# Patient Record
Sex: Male | Born: 1950 | Race: Black or African American | Hispanic: No | Marital: Single | Smoking: Former smoker
Health system: Southern US, Community
[De-identification: ages and names within clinical notes are randomized; demographics above are authoritative.]

## PROBLEM LIST (undated history)

## (undated) DIAGNOSIS — G629 Polyneuropathy, unspecified: Secondary | ICD-10-CM

## (undated) DIAGNOSIS — I10 Essential (primary) hypertension: Secondary | ICD-10-CM

## (undated) DIAGNOSIS — I5022 Chronic systolic (congestive) heart failure: Secondary | ICD-10-CM

## (undated) DIAGNOSIS — H269 Unspecified cataract: Secondary | ICD-10-CM

## (undated) DIAGNOSIS — R740 Nonspecific elevation of levels of transaminase and lactic acid dehydrogenase [LDH]: Secondary | ICD-10-CM

## (undated) DIAGNOSIS — E785 Hyperlipidemia, unspecified: Secondary | ICD-10-CM

## (undated) DIAGNOSIS — Z9989 Dependence on other enabling machines and devices: Secondary | ICD-10-CM

## (undated) DIAGNOSIS — J449 Chronic obstructive pulmonary disease, unspecified: Secondary | ICD-10-CM

## (undated) DIAGNOSIS — I2510 Atherosclerotic heart disease of native coronary artery without angina pectoris: Secondary | ICD-10-CM

## (undated) DIAGNOSIS — E119 Type 2 diabetes mellitus without complications: Secondary | ICD-10-CM

## (undated) DIAGNOSIS — I255 Ischemic cardiomyopathy: Secondary | ICD-10-CM

## (undated) DIAGNOSIS — K219 Gastro-esophageal reflux disease without esophagitis: Secondary | ICD-10-CM

## (undated) DIAGNOSIS — I739 Peripheral vascular disease, unspecified: Secondary | ICD-10-CM

## (undated) DIAGNOSIS — C801 Malignant (primary) neoplasm, unspecified: Secondary | ICD-10-CM

## (undated) DIAGNOSIS — Z95810 Presence of automatic (implantable) cardiac defibrillator: Secondary | ICD-10-CM

## (undated) DIAGNOSIS — R7401 Elevation of levels of liver transaminase levels: Secondary | ICD-10-CM

## (undated) DIAGNOSIS — Z720 Tobacco use: Secondary | ICD-10-CM

## (undated) HISTORY — PX: APPENDECTOMY: SHX54

## (undated) HISTORY — PX: COLONOSCOPY: SHX174

## (undated) NOTE — *Deleted (*Deleted)
***In Progress*** PCP: [name] [facility]) Cardiac Surgeon: Dr Roxan Hockey Pulmonologist: Dr. Lamonte Sakai Cardiology: Dr. Aundra Dubin  HPI:  [name] is a82 y.o.with history of COPD, HTN, 3V CAD s/p CABG x 5 (CABG 04/2013: LIMA to LAD, SVG to second diagonal, SVG to ramus intermediate and obtuse marginal 2, SVG to posterior descending), ischemic cardiomyopathy, chronic systolic HF, COPD, DM2, PVD and tobacco abuse. St Jude ICD.   Lexiscan Cardiolite in 12/19 showed inferior infarct, no ischemia. Echo in 12/19 showed EF 35%, mild LVH, mildly decreased RV systolic function. ABIs in 12/19 were stable compared to the past.   In the fall of 2020, he was diagnosed with non-small cell lung cancer. He has been treated with radiation and carboplatin/paclitaxel. He lost over 20 lbs. He was admitted 1/21 at [facility] with ICD shocks. These were found to be inappropriate shocks probably due to atrial fibrillation. He is now on amiodarone and Eliquis.   TEE in 3/21 was done to assess mitral regurgitation, showing EF 25-30%, mildly decreased RV systolic function, 3+ likely ischemic MR. In 5/21, he had symptomatic anemia. EGD/colonoscopy done, no definite cause. ABIs in 5/21 were stable.   He was recently seen by Dr. Aundra Dubin on 01/24/2020 and was volume overloaded. Weight was up 6 lbs but his BPwas low in the 123XX123 systolic. It appeared he had been taking both carvedilol and metoprolol succinate. Given his COPD, he was advised to stop carvedilol and to continue metoprolol succinate. Hydralazine was also discontinued. Furosemide was increased to 40 mg daily. Plan was to refer to paramedicine, but he did not qualify as he lives in [place] He has a home health aid that assists him with meds.   He recently returned to HF Clinic for follow up on 03/05/20 with Lyda Jester, PA-C. His weight was down 13 lbs from 204>>191 lbs. Hypotension resolved, BP was 112/64. PCP recently stopped  most of his HF meds due to worsening renal function and hyperkalemia. SW assisted with visit and confirmed that he was taken off furosemide, Farxiga, spironolactone, amlodipine, metoprolol and Entresto.   He reported that he was feeling better overall. Dyspnea had resolved. He complained of intermittent sharp left sided chest pain not worsened by exertion and exacerbated when sleeping on his left shoulder. Pain radiated to his back and was not associated with meals. EKG showed inferolateral ST changes. No active CP at time of appointment. His furosemide 40 mg daily and potassium chloride 20 mEq was restarted at this visit.  He returned for follow-up with pharmacist on 03/28/20. Patient was feeling great other than shoulder pain. Denied dizziness/lightheadedness but felt more tired when he was busy. He could walk 50 yards before feeling SOB. Weight at home was ~191 lbs. He was euvolemic with no LEE, PND or orthopnea. Was not limiting salt-intake regularly, so he was educated on the importance of low-salt diet. At this visit, metoprolol succinate 12.5 mg daily was started, and he was instructed to increase to 25 mg daily after 1 week.  On 04/15/20, he returned for follow-up with Lyda Jester, PA. He was mildly fluid overloaded on OptiVol and had elevated ReDS clip 39%. BP was low in the 123XX123 systolic, but he denied orthostatic symptoms. Complained of mild dyspnea on exertion.  Today he returns to HF clinic for pharmacist medication titration. At last visit with APP, Farxiga 10 mg daily was restarted.  112/76, HR 93, wt 88 kg - BP is historically low, [employer] BB if euvolemic, losartan 12.5?  Spiro 12.5 to help with K? Plan A: Spiro 12.5 to help with K? Plan B: Incr BB if euvolemic? Plan C: Losartan 12.5 if BP ok? Labs: F/u: APP in 1 mo  Overall feeling ***. Dizziness, lightheadedness, fatigue:  Chest pain or palpitations:  How is your breathing?: *** SOB: Able to complete all  ADLs. Activity level ***  Weight at home pounds. Takes furosemide/torsemide/bumex *** mg *** daily.  LEE PND/Orthopnea  Appetite *** Low-salt diet:   Physical Exam Cost/affordability of meds  HF Medications: Metoprolol succinate 25 mg daily Farxiga 10 mg daily Hydralazine 25 mg ??? Furosemide 40 mg daily Potassium chloride 20 mEq daily   Has the patient been experiencing any side effects to the medications prescribed?  {YES NO:22349}  Does the patient have any problems obtaining medications due to transportation or finances?   {YES NO:22349}  Understanding of regimen: {excellent/good/fair/poor:19665} Understanding of indications: {excellent/good/fair/poor:19665} Potential of compliance: {excellent/good/fair/poor:19665} Patient understands to avoid NSAIDs. Patient understands to avoid decongestants.   Pertinent Lab Values: . Serum creatinine 1.78, BUN 20, Potassium 3.5, Sodium 142   Vital Signs: . Weight: *** (last clinic weight: 88.3 kg) . Blood pressure: ***  . Heart rate: ***   Assessment: 1. CAD s/p CABG: Had cath in 10/16 with patent grafts, medical management.  Cardiolite in 12/19 showed no ischemia (just prior infarction).  Repeat NST for atypical CP 9/21 showed no ischemia - Continue atorvastatin.  - No aspirin given Eliquis use.      2. Chronic systolic HF: Ischemic cardiomyopathy, St Jude ICD.  TEE (3/21) with EF 25-30%.  - NYHA II-III. - ***Volume up based on Optivol and elevated ReDs clip at 39% - Continue furosemide 40 mg daily - Continue potassium chloride 20 mEq daily - Continue metoprolol succinate 25 mg daily - Continue Farxiga 10 mg daily  - I don't think his BP will tolerate retrial of Entresto (recently stopped due to AKI and hypotension) - off hydralazine due to hypotension   3. COPD/smoking: He has now quit smoking.  4. PAD: stable ABIs in 5/21. He denies claudication.  - Continue atorvastatin.   - no longer smoking - no ASA w/ Eliquis  use  5. CKD: Stage 3.   - check BMP today and again in 7 days (adding back [place])  6. Atrial fibrillation: Paroxysmal.  RRR on exam today.   - Continue Eliquis 5 mg BID - Previously stopped amiodarone on 03/05/20 given patient preference 7. Lung cancer: NSCLC.  Radiation is completed.   Plan: 1) Medication changes: Based on clinical presentation, vital signs and recent labs will *** 2) Labs: *** 3) Follow-up: ***   Audry Riles, PharmD, BCPS, BCCP, CPP Heart Failure Clinic Pharmacist [phone]

---
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
## 2009-09-26 ENCOUNTER — Ambulatory Visit: Payer: Self-pay

## 2010-07-02 ENCOUNTER — Emergency Department (HOSPITAL_COMMUNITY)
Admission: EM | Admit: 2010-07-02 | Discharge: 2010-07-02 | Payer: Self-pay | Source: Home / Self Care | Admitting: Emergency Medicine

## 2010-07-07 LAB — DIFFERENTIAL
Basophils Absolute: 0 10*3/uL (ref 0.0–0.1)
Basophils Relative: 0 % (ref 0–1)
Eosinophils Absolute: 0.1 10*3/uL (ref 0.0–0.7)
Eosinophils Relative: 1 % (ref 0–5)
Lymphocytes Relative: 14 % (ref 12–46)
Lymphs Abs: 1.5 10*3/uL (ref 0.7–4.0)
Monocytes Absolute: 0.8 10*3/uL (ref 0.1–1.0)
Monocytes Relative: 7 % (ref 3–12)
Neutro Abs: 8.6 10*3/uL — ABNORMAL HIGH (ref 1.7–7.7)
Neutrophils Relative %: 78 % — ABNORMAL HIGH (ref 43–77)

## 2010-07-07 LAB — BRAIN NATRIURETIC PEPTIDE: Pro B Natriuretic peptide (BNP): 250 pg/mL — ABNORMAL HIGH (ref 0.0–100.0)

## 2010-07-07 LAB — POCT I-STAT, CHEM 8
BUN: 19 mg/dL (ref 6–23)
Calcium, Ion: 1.14 mmol/L (ref 1.12–1.32)
Chloride: 107 mEq/L (ref 96–112)
Creatinine, Ser: 1.2 mg/dL (ref 0.4–1.5)
Glucose, Bld: 144 mg/dL — ABNORMAL HIGH (ref 70–99)
HCT: 46 % (ref 39.0–52.0)
Hemoglobin: 15.6 g/dL (ref 13.0–17.0)
Potassium: 4 mEq/L (ref 3.5–5.1)
Sodium: 141 mEq/L (ref 135–145)
TCO2: 24 mmol/L (ref 0–100)

## 2010-07-07 LAB — POCT CARDIAC MARKERS
CKMB, poc: 1.7 ng/mL (ref 1.0–8.0)
CKMB, poc: 2.4 ng/mL (ref 1.0–8.0)
Myoglobin, poc: 70 ng/mL (ref 12–200)
Myoglobin, poc: 91.9 ng/mL (ref 12–200)
Troponin i, poc: <0.05 ng/mL (ref 0.00–0.09)
Troponin i, poc: <0.05 ng/mL (ref 0.00–0.09)

## 2010-07-07 LAB — CBC
HCT: 43.7 % (ref 39.0–52.0)
Hemoglobin: 14.5 g/dL (ref 13.0–17.0)
MCH: 28.8 pg (ref 26.0–34.0)
MCHC: 33.2 g/dL (ref 30.0–36.0)
MCV: 86.7 fL (ref 78.0–100.0)
Platelets: 231 10*3/uL (ref 150–400)
RBC: 5.04 MIL/uL (ref 4.22–5.81)
RDW: 14.2 % (ref 11.5–15.5)
WBC: 11.1 10*3/uL — ABNORMAL HIGH (ref 4.0–10.5)

## 2011-05-17 ENCOUNTER — Inpatient Hospital Stay (HOSPITAL_COMMUNITY)
Admission: EM | Admit: 2011-05-17 | Discharge: 2011-05-20 | DRG: 193 | Disposition: A | Discharge status: Home / Self Care | Payer: Medicaid Other | Attending: Infectious Diseases | Admitting: Infectious Diseases

## 2011-05-17 ENCOUNTER — Other Ambulatory Visit: Payer: Self-pay

## 2011-05-17 ENCOUNTER — Emergency Department (HOSPITAL_COMMUNITY): Payer: Medicaid Other

## 2011-05-17 DIAGNOSIS — R0989 Other specified symptoms and signs involving the circulatory and respiratory systems: Secondary | ICD-10-CM

## 2011-05-17 DIAGNOSIS — M109 Gout, unspecified: Secondary | ICD-10-CM

## 2011-05-17 DIAGNOSIS — R0609 Other forms of dyspnea: Secondary | ICD-10-CM

## 2011-05-17 DIAGNOSIS — Z87891 Personal history of nicotine dependence: Secondary | ICD-10-CM

## 2011-05-17 DIAGNOSIS — J441 Chronic obstructive pulmonary disease with (acute) exacerbation: Secondary | ICD-10-CM | POA: Diagnosis present

## 2011-05-17 DIAGNOSIS — Z880 Allergy status to penicillin: Secondary | ICD-10-CM

## 2011-05-17 DIAGNOSIS — I10 Essential (primary) hypertension: Secondary | ICD-10-CM

## 2011-05-17 DIAGNOSIS — J449 Chronic obstructive pulmonary disease, unspecified: Secondary | ICD-10-CM

## 2011-05-17 DIAGNOSIS — I5031 Acute diastolic (congestive) heart failure: Secondary | ICD-10-CM | POA: Diagnosis present

## 2011-05-17 DIAGNOSIS — J189 Pneumonia, unspecified organism: Principal | ICD-10-CM

## 2011-05-17 DIAGNOSIS — I509 Heart failure, unspecified: Secondary | ICD-10-CM

## 2011-05-17 DIAGNOSIS — R0603 Acute respiratory distress: Secondary | ICD-10-CM

## 2011-05-17 HISTORY — DX: Chronic obstructive pulmonary disease, unspecified: J44.9

## 2011-05-17 HISTORY — DX: Essential (primary) hypertension: I10

## 2011-05-17 HISTORY — DX: Tobacco use: Z72.0

## 2011-05-17 HISTORY — DX: Hyperlipidemia, unspecified: E78.5

## 2011-05-17 LAB — CBC
HCT: 41.6 % (ref 39.0–52.0)
Hemoglobin: 13.8 g/dL (ref 13.0–17.0)
MCH: 28.6 pg (ref 26.0–34.0)
MCHC: 33.2 g/dL (ref 30.0–36.0)
MCV: 86.3 fL (ref 78.0–100.0)
Platelets: 367 10*3/uL (ref 150–400)
RBC: 4.82 MIL/uL (ref 4.22–5.81)
RDW: 16.2 % — ABNORMAL HIGH (ref 11.5–15.5)
WBC: 14.3 10*3/uL — ABNORMAL HIGH (ref 4.0–10.5)

## 2011-05-17 LAB — DIFFERENTIAL
Basophils Absolute: 0 10*3/uL (ref 0.0–0.1)
Basophils Relative: 0 % (ref 0–1)
Eosinophils Absolute: 0.2 10*3/uL (ref 0.0–0.7)
Eosinophils Relative: 1 % (ref 0–5)
Lymphocytes Relative: 11 % — ABNORMAL LOW (ref 12–46)
Lymphs Abs: 1.6 10*3/uL (ref 0.7–4.0)
Monocytes Absolute: 0.8 10*3/uL (ref 0.1–1.0)
Monocytes Relative: 6 % (ref 3–12)
Neutro Abs: 11.8 10*3/uL — ABNORMAL HIGH (ref 1.7–7.7)
Neutrophils Relative %: 82 % — ABNORMAL HIGH (ref 43–77)

## 2011-05-17 LAB — URINE MICROSCOPIC-ADD ON

## 2011-05-17 LAB — URINALYSIS, ROUTINE W REFLEX MICROSCOPIC
Glucose, UA: NEGATIVE mg/dL
Ketones, ur: NEGATIVE mg/dL
Leukocytes, UA: NEGATIVE
Nitrite: NEGATIVE
Protein, ur: >300 mg/dL — AB
Specific Gravity, Urine: 1.02 (ref 1.005–1.030)
Urobilinogen, UA: 1 mg/dL (ref 0.0–1.0)
pH: 5.5 (ref 5.0–8.0)

## 2011-05-17 LAB — EXPECTORATED SPUTUM ASSESSMENT W GRAM STAIN, RFLX TO RESP C

## 2011-05-17 LAB — COMPREHENSIVE METABOLIC PANEL
ALT: 20 U/L (ref 0–53)
AST: 17 U/L (ref 0–37)
Albumin: 3.3 g/dL — ABNORMAL LOW (ref 3.5–5.2)
Alkaline Phosphatase: 68 U/L (ref 39–117)
BUN: 14 mg/dL (ref 6–23)
CO2: 22 mEq/L (ref 19–32)
Calcium: 9.6 mg/dL (ref 8.4–10.5)
Chloride: 103 mEq/L (ref 96–112)
Creatinine, Ser: 1.04 mg/dL (ref 0.50–1.35)
GFR calc Af Amer: 89 mL/min — ABNORMAL LOW (ref >90)
GFR calc non Af Amer: 77 mL/min — ABNORMAL LOW (ref >90)
Glucose, Bld: 166 mg/dL — ABNORMAL HIGH (ref 70–99)
Potassium: 3.9 mEq/L (ref 3.5–5.1)
Sodium: 140 mEq/L (ref 135–145)
Total Bilirubin: 0.9 mg/dL (ref 0.3–1.2)
Total Protein: 7.7 g/dL (ref 6.0–8.3)

## 2011-05-17 LAB — TSH: TSH: 0.264 u[IU]/mL — ABNORMAL LOW (ref 0.350–4.500)

## 2011-05-17 LAB — POCT I-STAT TROPONIN I: Troponin i, poc: 0.02 ng/mL (ref 0.00–0.08)

## 2011-05-17 LAB — HEMOGLOBIN A1C
Hgb A1c MFr Bld: 5.9 % — ABNORMAL HIGH (ref <5.7)
Mean Plasma Glucose: 123 mg/dL — ABNORMAL HIGH (ref <117)

## 2011-05-17 LAB — HIV ANTIBODY (ROUTINE TESTING W REFLEX): HIV: NONREACTIVE

## 2011-05-17 LAB — PRO B NATRIURETIC PEPTIDE: Pro B Natriuretic peptide (BNP): 1672 pg/mL — ABNORMAL HIGH (ref 0–125)

## 2011-05-17 MED ORDER — POLYETHYLENE GLYCOL 3350 17 G PO PACK
17.0000 g | PACK | Freq: Every day | ORAL | Status: DC
  Administered 2011-05-17 – 2011-05-20 (×4): 17 g via ORAL
  Filled 2011-05-17 (×4): qty 1

## 2011-05-17 MED ORDER — ALLOPURINOL 100 MG PO TABS
100.0000 mg | ORAL_TABLET | Freq: Every day | ORAL | Status: DC
  Administered 2011-05-17 – 2011-05-20 (×4): 100 mg via ORAL
  Filled 2011-05-17 (×4): qty 1

## 2011-05-17 MED ORDER — ACETAMINOPHEN 325 MG PO TABS
650.0000 mg | ORAL_TABLET | Freq: Four times a day (QID) | ORAL | Status: DC | PRN
  Administered 2011-05-17 – 2011-05-19 (×4): 650 mg via ORAL
  Filled 2011-05-17 (×3): qty 2

## 2011-05-17 MED ORDER — SENNOSIDES-DOCUSATE SODIUM 8.6-50 MG PO TABS
1.0000 | ORAL_TABLET | Freq: Every day | ORAL | Status: DC | PRN
  Filled 2011-05-17: qty 1

## 2011-05-17 MED ORDER — BIOTENE DRY MOUTH MT LIQD
15.0000 mL | Freq: Two times a day (BID) | OROMUCOSAL | Status: DC
  Administered 2011-05-17 – 2011-05-20 (×8): 15 mL via OROMUCOSAL

## 2011-05-17 MED ORDER — FUROSEMIDE 10 MG/ML IJ SOLN
INTRAMUSCULAR | Status: AC
  Filled 2011-05-17: qty 2

## 2011-05-17 MED ORDER — IPRATROPIUM BROMIDE 0.02 % IN SOLN
0.5000 mg | RESPIRATORY_TRACT | Status: DC
  Administered 2011-05-17 (×2): 0.5 mg via RESPIRATORY_TRACT
  Filled 2011-05-17 (×2): qty 2.5

## 2011-05-17 MED ORDER — IPRATROPIUM BROMIDE 0.02 % IN SOLN: 0.5000 mg | RESPIRATORY_TRACT | Status: DC

## 2011-05-17 MED ORDER — ALBUTEROL SULFATE (5 MG/ML) 0.5% IN NEBU
2.5000 mg | INHALATION_SOLUTION | RESPIRATORY_TRACT | Status: DC
  Administered 2011-05-17: 2.5 mg via RESPIRATORY_TRACT
  Filled 2011-05-17: qty 0.5

## 2011-05-17 MED ORDER — LEVOFLOXACIN IN D5W 750 MG/150ML IV SOLN
750.0000 mg | INTRAVENOUS | Status: DC
  Administered 2011-05-17 – 2011-05-18 (×2): 750 mg via INTRAVENOUS
  Filled 2011-05-17 (×3): qty 150

## 2011-05-17 MED ORDER — SODIUM CHLORIDE 0.9 % IJ SOLN: 3.0000 mL | INTRAMUSCULAR | Status: DC | PRN

## 2011-05-17 MED ORDER — ACETAMINOPHEN 650 MG RE SUPP: 650.0000 mg | Freq: Four times a day (QID) | RECTAL | Status: DC | PRN

## 2011-05-17 MED ORDER — PANTOPRAZOLE SODIUM 40 MG IV SOLR
40.0000 mg | Freq: Every day | INTRAVENOUS | Status: DC
  Administered 2011-05-17: 40 mg via INTRAVENOUS
  Filled 2011-05-17 (×2): qty 40

## 2011-05-17 MED ORDER — ALBUTEROL SULFATE (5 MG/ML) 0.5% IN NEBU: 2.5000 mg | INHALATION_SOLUTION | RESPIRATORY_TRACT | Status: DC | PRN

## 2011-05-17 MED ORDER — ALBUTEROL SULFATE (5 MG/ML) 0.5% IN NEBU
INHALATION_SOLUTION | RESPIRATORY_TRACT | Status: AC
  Filled 2011-05-17: qty 2

## 2011-05-17 MED ORDER — NITROGLYCERIN IN D5W 200-5 MCG/ML-% IV SOLN
2.0000 ug/min | INTRAVENOUS | Status: DC
  Administered 2011-05-17: 30 ug/min via INTRAVENOUS

## 2011-05-17 MED ORDER — PREDNISONE 20 MG PO TABS
40.0000 mg | ORAL_TABLET | Freq: Every day | ORAL | Status: DC
  Administered 2011-05-18 – 2011-05-20 (×3): 40 mg via ORAL
  Filled 2011-05-17 (×4): qty 2

## 2011-05-17 MED ORDER — MOXIFLOXACIN HCL IN NACL 400 MG/250ML IV SOLN
400.0000 mg | Freq: Once | INTRAVENOUS | Status: AC
  Administered 2011-05-17: 400 mg via INTRAVENOUS
  Filled 2011-05-17: qty 250

## 2011-05-17 MED ORDER — IPRATROPIUM BROMIDE 0.02 % IN SOLN
0.5000 mg | Freq: Once | RESPIRATORY_TRACT | Status: AC
  Administered 2011-05-17: 0.5 mg via RESPIRATORY_TRACT
  Filled 2011-05-17: qty 2.5

## 2011-05-17 MED ORDER — ALBUTEROL SULFATE (5 MG/ML) 0.5% IN NEBU: 5.0000 mg | INHALATION_SOLUTION | RESPIRATORY_TRACT | Status: DC

## 2011-05-17 MED ORDER — NITROGLYCERIN 2 % TD OINT
1.0000 [in_us] | TOPICAL_OINTMENT | Freq: Once | TRANSDERMAL | Status: AC
  Administered 2011-05-17: 1 [in_us] via TOPICAL
  Filled 2011-05-17: qty 1

## 2011-05-17 MED ORDER — LEVALBUTEROL HCL 0.63 MG/3ML IN NEBU
0.6300 mg | INHALATION_SOLUTION | Freq: Four times a day (QID) | RESPIRATORY_TRACT | Status: DC
  Administered 2011-05-17: 0.63 mg via RESPIRATORY_TRACT
  Filled 2011-05-17 (×3): qty 3

## 2011-05-17 MED ORDER — ACETAMINOPHEN 325 MG PO TABS
ORAL_TABLET | ORAL | Status: AC
  Administered 2011-05-17: 650 mg
  Filled 2011-05-17: qty 2

## 2011-05-17 MED ORDER — FUROSEMIDE 10 MG/ML IJ SOLN
40.0000 mg | Freq: Three times a day (TID) | INTRAMUSCULAR | Status: DC
  Administered 2011-05-17 – 2011-05-18 (×2): 40 mg via INTRAVENOUS
  Filled 2011-05-17 (×5): qty 4

## 2011-05-17 MED ORDER — METHYLPREDNISOLONE SODIUM SUCC 125 MG IJ SOLR
125.0000 mg | Freq: Once | INTRAMUSCULAR | Status: AC
  Administered 2011-05-17: 125 mg via INTRAVENOUS
  Filled 2011-05-17: qty 2

## 2011-05-17 MED ORDER — MORPHINE SULFATE 2 MG/ML IJ SOLN: 1.0000 mg | INTRAMUSCULAR | Status: DC | PRN

## 2011-05-17 MED ORDER — SODIUM CHLORIDE 0.9 % IV SOLN
INTRAVENOUS | Status: DC
  Administered 2011-05-17: 16:00:00 via INTRAVENOUS

## 2011-05-17 MED ORDER — HEPARIN SODIUM (PORCINE) 5000 UNIT/ML IJ SOLN
5000.0000 [IU] | Freq: Three times a day (TID) | INTRAMUSCULAR | Status: DC
  Administered 2011-05-17 – 2011-05-19 (×7): 5000 [IU] via SUBCUTANEOUS
  Filled 2011-05-17 (×9): qty 1

## 2011-05-17 MED ORDER — FUROSEMIDE 10 MG/ML IJ SOLN: 40.0000 mg | Freq: Three times a day (TID) | INTRAMUSCULAR | Status: DC

## 2011-05-17 MED ORDER — LABETALOL HCL 5 MG/ML IV SOLN
5.0000 mg | Freq: Four times a day (QID) | INTRAVENOUS | Status: DC | PRN
  Filled 2011-05-17: qty 4

## 2011-05-17 MED ORDER — FUROSEMIDE 10 MG/ML IJ SOLN
80.0000 mg | Freq: Once | INTRAMUSCULAR | Status: AC
  Administered 2011-05-17: 20 mg via INTRAVENOUS

## 2011-05-17 MED ORDER — FUROSEMIDE 10 MG/ML IJ SOLN
60.0000 mg | Freq: Once | INTRAMUSCULAR | Status: AC
  Administered 2011-05-17: 60 mg via INTRAVENOUS
  Filled 2011-05-17: qty 6

## 2011-05-17 MED ORDER — ALBUTEROL (5 MG/ML) CONTINUOUS INHALATION SOLN
15.0000 mg | INHALATION_SOLUTION | RESPIRATORY_TRACT | Status: DC
  Administered 2011-05-17: 15 mg via RESPIRATORY_TRACT
  Filled 2011-05-17: qty 20

## 2011-05-17 MED ORDER — NITROGLYCERIN IN D5W 200-5 MCG/ML-% IV SOLN
2.0000 ug/min | INTRAVENOUS | Status: DC
  Administered 2011-05-17: 15 ug/min via INTRAVENOUS
  Administered 2011-05-17: 10 ug/min via INTRAVENOUS
  Filled 2011-05-17: qty 250

## 2011-05-17 MED ORDER — LEVALBUTEROL HCL 1.25 MG/0.5ML IN NEBU
1.2500 mg | INHALATION_SOLUTION | Freq: Four times a day (QID) | RESPIRATORY_TRACT | Status: DC | PRN
  Filled 2011-05-17: qty 0.5

## 2011-05-17 NOTE — ED Notes (Signed)
Pt states that for the past 3 days he has been having a congested cough with yellow sputum. Upon arrival he is unable to speak in complete sentences. Upon ems arrival to the home pt was short of breath and he was 79% sat at home. Pt is grunting upon arrival and is extremely short of breath. Pt received 5- 2.5mg  albuterol nebs pta with ems.

## 2011-05-17 NOTE — ED Notes (Signed)
Pt taken off of bipap and placed on oxygen 5lnc. Pt tolerating well. Admitting md at the bedside to write admission orders. Nursing to continue to monitor.

## 2011-05-17 NOTE — H&P (Signed)
Hospital Admission Note Date: 05/17/2011  Patient name: [name] [name] Medical record number: [id number] Date of birth: 20-May-1951 Age: 60 y.o. Gender: male PCP: [name], PA, PA  Medical Service:  Attending physician: Dr. Johnnye Sima    1st Contact: Rosette Reveal, MS4  Pager: [phone] 2nd Contact: Dr. Rosalia Hammers  Pager: [phone] After 5 pm or weekends: 1st Contact:      Pager: [phone] 2nd Contact:      Pager: [phone]  Chief Complaint:  SOB  History of Present Illness: [name] [name] is a 60 year old AA male with PMH significant for COPD and gout presenting with 3 day history of worsening SOB. He was in his usual state of health until Friday 11/23 when he began having SOB with a cough productive of yellowish sputum with streaks of blood. His home nebulizer treatments only slightly improved his worsening SOB and he went to the [facility] ED the same day. Upon discharge that day he was given steroids and doxycycline. His SOB continued to worsen despite home O2 and nebulizer use and was transported to hospital via EMS today 11/25 with 79% O2 saturation at home and 96% en route. Patient reports that his wife had an upper airway infection but denies any other sick contact or recent travel. Denies any chest pain, dizziness , nausea or vomiting.    In the ED patient was given solumedrol 125 mg IV, continuous nebulizer of 15 mg albuterol, 0.5 mg atrovent over 1 hour, and lasix 60 mg IV. Patient and started on bipap and nitrodrip secondary to continued hypertension since arrival. When the IM admitting team arrived he had been off of bipap with 4L [place] in place and O2 saturation 98-99%.  Meds: Medications Prior to Admission  Medication Dose Route Frequency Provider Last Rate Last Dose  . acetaminophen (TYLENOL) 325 MG tablet        650 mg at 05/17/11 0956  . albuterol (PROVENTIL,VENTOLIN) solution continuous neb  15 mg Nebulization Continuous Janice Norrie, MD   15 mg at 05/17/11 0820  . furosemide (LASIX)  injection 60 mg  60 mg Intravenous Once Janice Norrie, MD   60 mg at 05/17/11 0817  . ipratropium (ATROVENT) nebulizer solution 0.5 mg  0.5 mg Nebulization Once Janice Norrie, MD   0.5 mg at 05/17/11 [id number]  . methylPREDNISolone sodium succinate (SOLU-MEDROL) 125 MG injection 125 mg  125 mg Intravenous Once Janice Norrie, MD   125 mg at 05/17/11 0815  . moxifloxacin (AVELOX) IVPB 400 mg  400 mg Intravenous Once Janice Norrie, MD   400 mg at 05/17/11 1007  . nitroGLYCERIN (NITROGLYN) 2 % ointment 1 inch  1 inch Topical Once Janice Norrie, MD   1 inch at 05/17/11 0841  . nitroGLYCERIN 0.2 mg/mL in dextrose 5 % infusion  2-200 mcg/min Intravenous Titrated Janice Norrie, MD 4.5 mL/hr at 05/17/11 0952 15 mcg/min at 05/17/11 [id number]   No current outpatient prescriptions on file as of 05/17/2011.   Home medication: Albuterol  Allopurinol 100 mg daily Doxycycline 100 mg bid started on 11/23 Indomethacin 50 mg daily Prednisone taper with 60 mg for one day and taper down 10 mg daily. Patient started on 11/23  Allergies: Penicillins - swelling ( as a child)    Past Medical History  Diagnosis Date  . COPD (chronic obstructive pulmonary disease)   . Gout   . Emphysema    History reviewed. No pertinent past surgical history. History reviewed. No pertinent family history. History  Social History  . Marital Status: Significant Other    Spouse Name: N/A    Number of Children: N/A  . Years of Education: N/A   Occupational History  . Not on file.   Social History Main Topics  . Smoking status: Current Everyday Smoker -- 1.0 packs/day  . Smokeless tobacco: Not on file  . Alcohol Use: No  . Drug Use: No  . Sexually Active:    Other Topics Concern  . Not on file   Social History Narrative  . No narrative on file   Review of Systems: Constitutional: Reports tactile fever, chills, diaphoresis, thirst, denies appetite change and fatigue.  HEENT: Denies photophobia, eye pain, redness, hearing loss, ear  pain, congestion, sore throat, rhinorrhea, sneezing, mouth sores, trouble swallowing, neck pain, neck stiffness and tinnitus.   Respiratory: Reports SOB, cough, yellowish sputum, denies chest tightness and wheezing.   Cardiovascular: Denies chest pain, palpitations but reports leg swelling.  Gastrointestinal: Reports constipation, 1 positive FOBT at [facility] ED, denies nausea, vomiting, abdominal pain, diarrhea, and abdominal distention.  Genitourinary: Denies dysuria, urgency, frequency, hematuria, flank pain and difficulty urinating.  Musculoskeletal: Reports joint swelling, arthralgias, denies myalgias, back pain, and gait problem.  Skin: Denies pallor, rash and wound.  Neurological: Denies dizziness, syncope, weakness, light-headedness, numbness and headaches.  Hematological: Denies adenopathy. Easy bruising, personal or family bleeding history    Physical Exam: Blood pressure 133/78, pulse 121, temperature 98.1 F (36.7 C), temperature source Oral, resp. rate 42, SpO2 97.00%.  Constitutional: Vital signs reviewed.  Patient is a well-developed and well-nourished  in mild distress secondary to increased work of breathing, and cooperative with exam. Alert and oriented x3.  Head: Normocephalic and atraumatic, nasal canula in place, mild erythema in distribution of bipap mask Ear: TM normal bilaterally Mouth: no erythema or exudates, poor dentition Eyes: PERRL, EOMI, conjunctivae normal, No scleral icterus.  Neck: Supple, Trachea midline normal ROM, No JVD, mass, thyromegaly, or carotid bruit present.  Cardiovascular: RRR, S1 normal, S2 normal, no MRG, pulses symmetric and intact bilaterally Pulmonary/Chest: increased inspiratory effort, equal air entry b/l with diffuse coarse breath sounds. Rales noted lower right anterior field. Abdominal: Soft. Non-tender, mildly distended, bowel sounds are normal, no masses, organomegaly, or guarding present.  GU: no CVA tenderness Musculoskeletal:  non-tender swelling of Left ankle, b/l pitting edema to knees, unable to fully close left hand 2/2 stiffness, no joint deformities, erythema Neurological: A&O x3, Strenght is normal and symmetric bilaterally, cranial nerve II-XII are grossly intact, no focal motor deficit, sensory intact to light touch bilaterally.  Skin: Cool, dry and intact. No rash, cyanosis, or clubbing.     Lab results: Basic Metabolic Panel:  [facility] 05/17/11 0810  NA 140  K 3.9  CL 103  CO2 22  GLUCOSE 166*  BUN 14  CREATININE 1.04  CALCIUM 9.6  MG --  PHOS --   Liver Function Tests:  [facility] 05/17/11 0810  AST 17  ALT 20  ALKPHOS 68  BILITOT 0.9  PROT 7.7  ALBUMIN 3.3*   CBC:  [facility] 05/17/11 0810  WBC 14.3*  NEUTROABS 11.8*  HGB 13.8  HCT 41.6  MCV 86.3  PLT 367   BNP:  [facility] 05/17/11 0810  POCBNP 1672.0*    Imaging results:  Dg Chest Portable 1 View  05/17/2011  *RADIOLOGY REPORT*  Clinical Data: 60 year old male with shortness of breath.  PORTABLE CHEST - 1 VIEW  Comparison: 07/02/2010  Findings: Upper limits normal heart size again noted. Moderate  bilateral airspace opacities is compatible with pulmonary edema. No definite pleural effusions or pneumothorax noted. No acute bony abnormalities are identified.  IMPRESSION: Moderate pulmonary edema.  Original Report Authenticated By: Lura Em, M.D.     Assessment & Plan by Problem:  #1. SOB - multifactorial including COPD exacerbation ,  CHF exacerbation considering lower extremity edema, elevated BNP values, basilar rales, and pulmonary edema on CXR as well as possible CAP since patient report of productive cough with bloody and yellow sputum,  fevers and chills.  Other DD include ACS although unlikely since patient denies any chest pain, first set of Troponin negative and no ECG changes noted. PE should be include in the  DD but modified Wells score of 1.5 making PE a low probability. - Will admit patient to Step down -  Will start Duonebs q4hrs and Albuterol q2 hrs prn  - Patient given Solumedrol 125 mg IV  Times one in the ED , Will start a slow prednisone taper starting dose with 40 mg tomorrow - Will start Levoquin for possible CAP  - Will obtain blood culture, sputum gram stain and culture  - Will start Lasix IV 40 mg bid for possible CHF   - Will obtain 2 D Echo  - Will repeat CXray  - Consider CT chest for further evalution  #HTN- blood pressure elevated in the ED. Patient was started initially on Nitro drop which was subsequently d/c since blood pressure was stable int 130s/80s range. Patient had history of HTN but is currently not on any medication. - Will start Labetalol 5 mg IV q 6hrs prn for SBP >180 and DBP >100  - Will obtain 2 D Echo  - Consider starting ACE and bblocker  #Gout Chronic condition, will d/c indomethacin -continue home med Allopurinol  # DVTppx: Heparin   SignedRosalia Hammers 05/17/2011, 10:44 AM  Internal Medicine Teaching Service Attending Note Date: 05/18/2011  Patient name: [name] [name]  Medical record number: [id number]  Date of birth: June 12, 1951    This patient has been seen and discussed with the house staff. Please see their note for complete details. I concur with their findings with the following additions/corrections:  [name] [name] 05/18/2011, 9:57 AM

## 2011-05-17 NOTE — ED Provider Notes (Signed)
History     CSN: [id number] Arrival date & time: 05/17/2011  7:53 AM   First MD Initiated Contact with Patient 05/17/11 0759      Chief Complaint  Patient presents with  . Shortness of Breath   Level V caveat because of respiratory distress  Pt seen on arrival  (Consider location/radiation/quality/duration/timing/severity/associated sxs/prior treatment) HPI  Per EMS patient has had 3 days of yellow-green cough and shortness of breath. He was seen by his doctor 2 days ago and started on prednisone and doxycycline. They report when they arrived he had a pulse ox of 79% on room air. Patient states he has oxygen at home but he doesn't use it all the time. He is unclear how much he uses. EMS reports he was unable to speak, he was having retractions and tripoding. He also reports he was diaphoretic with respiratory rate of 30. He has had 5 albuterol treatments of 2.5 mg each with mild improvement.  Primary care Dr Marybelle Killings in [place]  Past Medical History  Diagnosis Date  . COPD (chronic obstructive pulmonary disease)   . Gout   . Emphysema     History reviewed. No pertinent past surgical history. Appendectomy  History reviewed. No pertinent family history.  History  Substance Use Topics  . Smoking status: Current Everyday Smoker -- 1.0 packs/day  . Smokeless tobacco: Not on file  . Alcohol Use: No   Lives at home On disability for gout Home oxygen when necessary  Review of Systems  Unable to perform ROS   Allergies  Penicillins  Home Medications   Patient's Medications  New Prescriptions   No medications on file  Previous Medications   ALBUTEROL (PROVENTIL HFA;VENTOLIN HFA) 108 (90 BASE) MCG/ACT INHALER    Inhale 2 puffs into the lungs every 4 (four) hours as needed. For shortness of breath    ALLOPURINOL (ZYLOPRIM) 100 MG TABLET    Take 100 mg by mouth daily.     DOXYCYCLINE (VIBRAMYCIN) 100 MG CAPSULE    Take 100 mg by mouth 2 (two) times daily.     INDOMETHACIN (INDOCIN) 50 MG CAPSULE    Take 50 mg by mouth 3 (three) times daily with meals.     PREDNISONE (DELTASONE) 10 MG TABLET    Take 10-60 mg by mouth daily. Day 2 of taper-started 05/15/11 with 6 tablets daily, decreasing by 1 tablet each day until gone   Modified Medications   No medications on file  Discontinued Medications   No medications on file     BP 158/104  Pulse 127  Temp(Src) 98.1 F (36.7 C) (Oral)  Resp 42  SpO2 100% Vital signs hypertension and tachypnea   Physical Exam  Vitals reviewed. Constitutional: He appears well-developed and well-nourished.       Patient in moderate respiratory distress  HENT:  Head: Normocephalic and atraumatic.  Right Ear: External ear normal.  Left Ear: External ear normal.  Mouth/Throat: Oropharynx is clear and moist.  Eyes: Conjunctivae and EOM are normal. Pupils are equal, round, and reactive to light.  Neck: Normal range of motion. Neck supple.  Cardiovascular: Normal rate, regular rhythm and normal heart sounds.   Pulmonary/Chest: He has rales.       Patient in moderate respiratory distress with retractions. He has diffuse rales in his bases. He has deep decreased breath sounds diffusely.  Abdominal: Soft. He exhibits distension. There is no tenderness.  Musculoskeletal:       Patient has mild pitting edema of both  is lower legs halfway up his shins  Neurological: He is alert.  Skin:       Skin is cool to touch and diaphoretic  Psychiatric:       The patient is alert slightly anxious he is cooperative    ED Course  Procedures (including critical care time)  Patient placed on BiPAP by respiratory therapy shortly after arrival. He was given Solu-Medrol 125 mg IV and started on a continuous nebulizer of 15 mg of albuterol and 0.5 mg Atrovent over 1 hour. He was given Lasix 60 mg IV and a Foley was placed.  08:45 Patient remained hypertensive and nitroglycerin paste was placed.184/115 patient remains on BiPAP and  appears much less distress. His skin is now dry and no longer diaphoretic. Pt switched to NTG drip to control his hypertension  09:35 Dr Evette Doffing, [facility] admit to step-down, Dr Johnnye Sima  09:45 Pt taken off bipap and is tolerating it well. BP 165/92 on NTG drip. [place] over 1300 cc, pulse ox 94-95 % on RA  Results for orders placed during the hospital encounter of 05/17/11  CBC      Component Value Range   WBC 14.3 (*) 4.0 - 10.5 (K/uL)   RBC 4.82  4.22 - 5.81 (MIL/uL)   Hemoglobin 13.8  13.0 - 17.0 (g/dL)   HCT 41.6  39.0 - 52.0 (%)   MCV 86.3  78.0 - 100.0 (fL)   MCH 28.6  26.0 - 34.0 (pg)   MCHC 33.2  30.0 - 36.0 (g/dL)   RDW 16.2 (*) 11.5 - 15.5 (%)   Platelets 367  150 - 400 (K/uL)  DIFFERENTIAL      Component Value Range   Neutrophils Relative 82 (*) 43 - 77 (%)   Neutro Abs 11.8 (*) 1.7 - 7.7 (K/uL)   Lymphocytes Relative 11 (*) 12 - 46 (%)   Lymphs Abs 1.6  0.7 - 4.0 (K/uL)   Monocytes Relative 6  3 - 12 (%)   Monocytes Absolute 0.8  0.1 - 1.0 (K/uL)   Eosinophils Relative 1  0 - 5 (%)   Eosinophils Absolute 0.2  0.0 - 0.7 (K/uL)   Basophils Relative 0  0 - 1 (%)   Basophils Absolute 0.0  0.0 - 0.1 (K/uL)  COMPREHENSIVE METABOLIC PANEL      Component Value Range   Sodium 140  135 - 145 (mEq/L)   Potassium 3.9  3.5 - 5.1 (mEq/L)   Chloride 103  96 - 112 (mEq/L)   CO2 22  19 - 32 (mEq/L)   Glucose, Bld 166 (*) 70 - 99 (mg/dL)   BUN 14  6 - 23 (mg/dL)   Creatinine, Ser 1.04  0.50 - 1.35 (mg/dL)   Calcium 9.6  8.4 - 10.5 (mg/dL)   Total Protein 7.7  6.0 - 8.3 (g/dL)   Albumin 3.3 (*) 3.5 - 5.2 (g/dL)   AST 17  0 - 37 (U/L)   ALT 20  0 - 53 (U/L)   Alkaline Phosphatase 68  39 - 117 (U/L)   Total Bilirubin 0.9  0.3 - 1.2 (mg/dL)   GFR calc non Af Amer 77 (*) >90 (mL/min)   GFR calc Af Amer 89 (*) >90 (mL/min)  PRO B NATRIURETIC PEPTIDE      Component Value Range   BNP, POC 1672.0 (*) 0 - 125 (pg/mL)  URINALYSIS, ROUTINE W REFLEX MICROSCOPIC      Component Value Range    Color, Urine AMBER (*) YELLOW  Appearance CLOUDY (*) CLEAR    Specific Gravity, Urine 1.020  1.005 - 1.030    pH 5.5  5.0 - 8.0    Glucose, UA NEGATIVE  NEGATIVE (mg/dL)   Hgb urine dipstick MODERATE (*) NEGATIVE    Bilirubin Urine SMALL (*) NEGATIVE    Ketones, ur NEGATIVE  NEGATIVE (mg/dL)   Protein, ur >300 (*) NEGATIVE (mg/dL)   Urobilinogen, UA 1.0  0.0 - 1.0 (mg/dL)   Nitrite NEGATIVE  NEGATIVE    Leukocytes, UA NEGATIVE  NEGATIVE   POCT I-STAT TROPONIN I      Component Value Range   Troponin i, poc 0.02  0.00 - 0.08 (ng/mL)   Comment 3           URINE MICROSCOPIC-ADD ON      Component Value Range   Squamous Epithelial / LPF RARE  RARE    RBC / HPF 3-6  <3 (RBC/hpf)   Urine-Other AMORPHOUS URATES/PHOSPHATES     Laboratory interpretation of psychosis, elevation of BNP assistant with exam findings of congestive heart failure, microscopic hematuria consistent with Foley placement  BNP in January was in 200 range  Dg Chest Portable 1 View  05/17/2011  *RADIOLOGY REPORT*  Clinical Data: 60 year old male with shortness of breath.  PORTABLE CHEST - 1 VIEW  Comparison: 07/02/2010  Findings: Upper limits normal heart size again noted. Moderate bilateral airspace opacities is compatible with pulmonary edema. No definite pleural effusions or pneumothorax noted. No acute bony abnormalities are identified.  IMPRESSION: Moderate pulmonary edema.  Original Report Authenticated By: Lura Em, M.D.      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  Narrative Interpretation:   Old EKG Reviewed: unchanged from 07/02/10 except for rate was 86    Diagnoses that have been ruled out:  Diagnoses that are still under consideration:  Final diagnoses:  Congestive heart failure  Hypertension  COPD  (chronic obstructive pulmonary disease)     Medications  albuterol (PROVENTIL,VENTOLIN) solution continuous neb (15 mg Nebulization New Bag 05/17/11 0820)  albuterol (PROVENTIL HFA;VENTOLIN HFA) 108 (90 BASE) MCG/ACT inhaler (not administered)  albuterol (PROVENTIL) (5 MG/ML) 0.5% nebulizer solution (not administered)  nitroGLYCERIN 0.2 mg/mL in dextrose 5 % infusion (10 mcg/min Intravenous New Bag 05/17/11 0930)  methylPREDNISolone sodium succinate (SOLU-MEDROL) 125 MG injection 125 mg (125 mg Intravenous Given 05/17/11 0815)  ipratropium (ATROVENT) nebulizer solution 0.5 mg (0.5 mg Nebulization Given 05/17/11 0821)  furosemide (LASIX) injection 60 mg (60 mg Intravenous Given 05/17/11 0817)  nitroGLYCERIN (NITROGLYN) 2 % ointment 1 inch (1 inch Topical Given 05/17/11 0841)  avelox  Plan admission  CRITICAL CARE Performed by: Rolland Porter L   Total critical care time: 40 minutes  Critical care time was exclusive of separately billable procedures and treating other patients.  Critical care was necessary to treat or prevent imminent or life-threatening deterioration.  Critical care was time spent personally by me on the following activities: development of treatment plan with patient and/or surrogate as well as nursing, discussions with consultants, evaluation of patient's response to treatment, examination of patient, obtaining history from patient or surrogate, ordering and performing treatments and interventions, ordering and review of laboratory studies, ordering and review of radiographic studies, pulse oximetry and re-evaluation of patient's condition.   Rolland Porter, MD, FACEP  MDM  Janice Norrie, MD 05/17/11 1535

## 2011-05-17 NOTE — ED Notes (Signed)
Family contact is preston tinnin  [phone]. Pt lives at home and stated to ems that for the past 3 days he has been having increasing shortness of breath with a productive cough. He states that his mucus was slightly yellow. Upon ems arrival they stated that his room air sats were 79%. Pt has oxygen as needed and a nebulizer machine as needed. Hart ems gave him a total of 5 breathing treatments with 2.5mg  albuterol with no relief. Pt is sitting in tri-pod position upon arrival and is unable to speak in complete sentances. ems was unable to obtain iv access. Iv started here and pt given solumedrol and lasix. Second line obtained and labs drawn. Portable chest x-ray completed and foley cath inserted. Pt tolerated well. All supplies accounted for post procedure. Clear yellow urine draining into bag. Pt started on bipap per respiratory therapy protocols and in line placed so an hour long albuterol neb can be started. Pt remains sinus tach on the monitor. Topical nitroglycerin applied in order to try to decrease blood pressure. Family at the bedside. Nursing to continue to monitor.

## 2011-05-17 NOTE — ED Notes (Signed)
Sob x 3 days, inc worse; been taking freq neb. Txs. Resp. Distress symptoms - cool, diaphoretic. St 142. Albuterol  10/.5 mg in route.; improved some, on 10% nrb. Initial spo2 was 79%; enroute 96%. unaccessible piv sticks x 3 by ems.

## 2011-05-18 ENCOUNTER — Inpatient Hospital Stay (HOSPITAL_COMMUNITY): Payer: Medicaid Other

## 2011-05-18 DIAGNOSIS — J189 Pneumonia, unspecified organism: Secondary | ICD-10-CM

## 2011-05-18 DIAGNOSIS — I509 Heart failure, unspecified: Secondary | ICD-10-CM

## 2011-05-18 LAB — CBC
Platelets: 367 10*3/uL (ref 150–400)
RBC: 4.5 MIL/uL (ref 4.22–5.81)
WBC: 21.1 10*3/uL — ABNORMAL HIGH (ref 4.0–10.5)

## 2011-05-18 LAB — BASIC METABOLIC PANEL
CO2: 26 mEq/L (ref 19–32)
Calcium: 9.9 mg/dL (ref 8.4–10.5)
Chloride: 100 mEq/L (ref 96–112)
Potassium: 3.8 mEq/L (ref 3.5–5.1)
Sodium: 140 mEq/L (ref 135–145)

## 2011-05-18 MED ORDER — LEVALBUTEROL HCL 1.25 MG/0.5ML IN NEBU
1.2500 mg | INHALATION_SOLUTION | RESPIRATORY_TRACT | Status: DC | PRN
  Filled 2011-05-18: qty 0.5

## 2011-05-18 MED ORDER — IPRATROPIUM BROMIDE 0.02 % IN SOLN
0.5000 mg | Freq: Three times a day (TID) | RESPIRATORY_TRACT | Status: DC
  Administered 2011-05-18 – 2011-05-19 (×5): 0.5 mg via RESPIRATORY_TRACT
  Filled 2011-05-18 (×5): qty 2.5

## 2011-05-18 MED ORDER — LEVALBUTEROL HCL 0.63 MG/3ML IN NEBU
0.6300 mg | INHALATION_SOLUTION | Freq: Three times a day (TID) | RESPIRATORY_TRACT | Status: DC
  Administered 2011-05-18 – 2011-05-19 (×5): 0.63 mg via RESPIRATORY_TRACT
  Filled 2011-05-18 (×8): qty 3

## 2011-05-18 MED ORDER — PANTOPRAZOLE SODIUM 40 MG PO TBEC
40.0000 mg | DELAYED_RELEASE_TABLET | Freq: Every day | ORAL | Status: DC
  Administered 2011-05-18 – 2011-05-19 (×2): 40 mg via ORAL
  Filled 2011-05-18 (×2): qty 1

## 2011-05-18 NOTE — Progress Notes (Signed)
I have read and agree with note of Lorriane Shire .   General appearance: alert, cooperative and no distress   Lungs: diminished breath sounds bilaterally and with equal air entry, mild rales in posterior inferior lung fields . Improved exam compared to yesterday.  Heart: S1, S2: normal and tachycardic rate, regular rhythm  Abdomen: normal findings: bowel sounds normal and soft, nontender, mildly distended with +BSx4  Extremities: extremities normal, atraumatic, no cyanosis, 1+ pitting edema to mid tibia b/l  Pulses: 2+ and symmetric  Skin: Skin color, texture, turgor normal. No rashes or lesions  Neurologic: Grossly normal  A/P:  #SOB - improving  Multifactorial including COPD exacerbation, CHF exacerbation considering lower extremity edema, elevated BNP values, basilar rales, and pulmonary edema on CXR as well as possible CAP since patient report of productive cough with bloody and yellow sputum, fevers and chills. Patient has responded well to diuresis, has been afebrile since admission, and CXR performed today shows improvement from yesterday in line with symptomatic improvement in his SOB and productive cough.  - Transfer patient from Step down to telemetry  - Duonebs q4hrs  - Albuterol prn d/c and switch to Xopenex prn 2/2 tachycardia  - Prednisone taper starting dose with 40 mg  - Levoquin for possible CAP  - Will f/u blood culture, sputum gram stain and culture  - Will d/c  Lasix IV 40 mg bid since creatinine elevated form admission - Will obtain 2 D Echo   #HTN - stable  Blood pressure elevated in the ED. Patient was started initially on Nitro drop which was subsequently d/c since blood pressure was stable int 130s/80s range. Patient had history of HTN but is currently not on any medication.  - Will start Labetalol 5 mg IV q 6hrs prn for SBP >180 and DBP >100  - Will obtain 2 D Echo  - Consider starting ACE and bblocker  - Will d/c Lasix due to worsening renal function.    #Gout  Chronic condition, will d/c indomethacin. Patient has no current complaints  -continue home med Allopurinol  - prednisone 40 mg   # DVTppx: Heparin   Rosalia Hammers

## 2011-05-18 NOTE — Progress Notes (Signed)
Patient ID: [name], male   DOB: December 30, 1950, 60 y.o.   MRN: [id number] Internal Medicine Teaching Service Attending Note Date: 05/18/2011  Patient name: [name] [name]  Medical record number: [id number]  Date of birth: 08-05-1950   I have seen and evaluated [name] [name] and discussed their care with the Residency Team.   60 yo M with hx of COPD adm with worsening SOB, occas cough with blood in sputum. Was seen in ED and tx with doxy and steroids but did not improve.  He is a smoker and he has continued to smoke until recently. His wife also states that she has recently had URI.  This AM he feels somewhat better, decreased SOB, O2 requirements decreased, he does c/o nasal congestion however.   Physical Exam: Blood pressure 136/72, pulse 115, temperature 98 F (36.7 C), temperature source Oral, resp. rate 28, height 5' 7.5" (1.715 m), weight 103 kg (227 lb 1.2 oz), SpO2 95.00%. BP 136/72  Pulse 115  Temp(Src) 98 F (36.7 C) (Oral)  Resp 28  Ht 5' 7.5" (1.715 m)  Wt 103 kg (227 lb 1.2 oz)  BMI 35.04 kg/m2  SpO2 95%  General Appearance:    Alert, cooperative, no distress, appears stated age  Head:    Normocephalic, without obvious abnormality  Eyes:    PERRL, conjunctiva/corneas clear, EOM's intact,            Throat:   Lips, mucosa, and tongue normal normal  Neck:   Supple, symmetrical, trachea midline, no adenopathy;       thyroid:  No enlargement/tenderness/nodules; no  JVD     Lungs:     Decreased breath sounds at bases, rhonchi on L.  respirations unlabored     Heart:    Regular rate and rhythm, S1 and S2 normal, no murmur  Abdomen:     Soft, non-tender, bowel sounds active all four quadrants,    no masses, no organomegaly        Extremities:   Extremities normal, atraumatic, no cyanosis or edema     Skin:   Skin color, texture, turgor normal, no rashes or lesions  Lymph nodes:   Cervical, supraclavicular nodes normal  Neurologic:   Grossly normal.     Lab results: Results  for orders placed during the hospital encounter of 05/17/11 (from the past 24 hour(s))  TSH     Status: Abnormal   Collection Time   05/17/11 12:00 PM      Component Value Range   TSH 0.264 (*) 0.350 - 4.500 (uIU/mL)  HEMOGLOBIN A1C     Status: Abnormal   Collection Time   05/17/11 12:00 PM      Component Value Range   Hemoglobin A1C 5.9 (*) <5.7 (%)   Mean Plasma Glucose 123 (*) <117 (mg/dL)  HIV ANTIBODY (ROUTINE TESTING)     Status: Normal   Collection Time   05/17/11 12:00 PM      Component Value Range   HIV NON REACTIVE  NON REACTIVE   CULTURE, BLOOD (ROUTINE X 2)     Status: Normal (Preliminary result)   Collection Time   05/17/11 12:00 PM      Component Value Range   Specimen Description BLOOD LEFT HAND     Special Requests BOTTLES DRAWN AEROBIC AND ANAEROBIC 10CC     Setup Time [id number]     Culture       Value:        BLOOD CULTURE RECEIVED NO GROWTH TO DATE  CULTURE WILL BE HELD FOR 5 DAYS BEFORE ISSUING A FINAL NEGATIVE REPORT   Report Status PENDING    CULTURE, SPUTUM-ASSESSMENT     Status: Normal   Collection Time   05/17/11  1:32 PM      Component Value Range   Specimen Description SPUTUM     Special Requests NONE     Sputum evaluation       Value: THIS SPECIMEN IS ACCEPTABLE. RESPIRATORY CULTURE REPORT TO FOLLOW.   Report Status 05/17/2011 FINAL    CULTURE, RESPIRATORY     Status: Normal (Preliminary result)   Collection Time   05/17/11  1:32 PM      Component Value Range   Specimen Description SPUTUM     Special Requests NONE     Gram Stain       Value: FEW WBC PRESENT,BOTH PMN AND MONONUCLEAR     FEW SQUAMOUS EPITHELIAL CELLS PRESENT     MODERATE GRAM POSITIVE COCCI IN PAIRS     FEW GRAM POSITIVE RODS   Culture Culture reincubated for better growth     Report Status PENDING    MRSA PCR SCREENING     Status: Normal   Collection Time   05/17/11  1:48 PM      Component Value Range   MRSA by PCR NEGATIVE  NEGATIVE   CBC     Status: Abnormal    Collection Time   05/18/11  4:45 AM      Component Value Range   WBC 21.1 (*) 4.0 - 10.5 (K/uL)   RBC 4.50  4.22 - 5.81 (MIL/uL)   Hemoglobin 12.8 (*) 13.0 - 17.0 (g/dL)   HCT 39.0  39.0 - 52.0 (%)   MCV 86.7  78.0 - 100.0 (fL)   MCH 28.4  26.0 - 34.0 (pg)   MCHC 32.8  30.0 - 36.0 (g/dL)   RDW 16.6 (*) 11.5 - 15.5 (%)   Platelets 367  150 - 400 (K/uL)  BASIC METABOLIC PANEL     Status: Abnormal   Collection Time   05/18/11  4:45 AM      Component Value Range   Sodium 140  135 - 145 (mEq/L)   Potassium 3.8  3.5 - 5.1 (mEq/L)   Chloride 100  96 - 112 (mEq/L)   CO2 26  19 - 32 (mEq/L)   Glucose, Bld 112 (*) 70 - 99 (mg/dL)   BUN 22  6 - 23 (mg/dL)   Creatinine, Ser 1.31  0.50 - 1.35 (mg/dL)   Calcium 9.9  8.4 - 10.5 (mg/dL)   GFR calc non Af Amer 58 (*) >90 (mL/min)   GFR calc Af Amer 67 (*) >90 (mL/min)    Imaging results:  Dg Chest 2 View  05/18/2011  *RADIOLOGY REPORT*  Clinical Data: Shortness of breath, COPD.  CHEST - 2 VIEW  Comparison: 05/17/2011  Findings: Partial clearance of the lungs.  Mild residual bilateral lower lobe opacities.  Heart is normal size.  Small bilateral effusions on the lateral view.  No acute bony abnormality.  IMPRESSION: Significant improvement in aeration with decreasing bilateral opacities.  Trace effusions.  Original Report Authenticated By: Raelyn Number, M.D.   Dg Chest Portable 1 View  05/17/2011  *RADIOLOGY REPORT*  Clinical Data: 60 year old male with shortness of breath.  PORTABLE CHEST - 1 VIEW  Comparison: 07/02/2010  Findings: Upper limits normal heart size again noted. Moderate bilateral airspace opacities is compatible with pulmonary edema. No definite pleural effusions or pneumothorax noted.  No acute bony abnormalities are identified.  IMPRESSION: Moderate pulmonary edema.  Original Report Authenticated By: Lura Em, M.D.    Assessment and Plan: I agree with the formulated Assessment and Plan with the following changes:  CHF-  will continue to diurese him, he is out ~4.6 L so far. His CXR is improving. Decrease his O2 as we can.  Increased GLC- his HgBA1C was slightly elevated. Will need outpt f/u. ? From steroids.  HTN- will try to improve his home, oral regimen.  COPD- continue anbx, reinforce smoking cessation, continue steroids.

## 2011-05-18 NOTE — Progress Notes (Signed)
  Echocardiogram 2D Echocardiogram has been performed.  Ackley, RDCS 05/18/2011, 3:42 PM

## 2011-05-18 NOTE — Progress Notes (Signed)
Internal Medicine MS4 Progress Note  Subjective: [name] [name] was seen and examined bedside this morning. He says that he had no acute adverse overnight events and only c/o mild headache which has now subsided. He also felt some mild warmth and later chills which he attributed to the room temperature. He states that his SOB has improved and he has not had a cough or produced sputum since last night. He had a non-painful BM last night but still feels slightly "full". He has no other complaints and still denies any chest pain, nausea, or vomiting.  Objective: Vital signs in last 24 hours: Filed Vitals:   05/18/11 0759 05/18/11 0800 05/18/11 0813 05/18/11 0815  BP:    148/75  Pulse:  104  108  Temp:   98 F (36.7 C)   TempSrc:      Resp:  18  27  Height:      Weight:      SpO2: 99% 98%  96%   Weight change:   Intake/Output Summary (Last 24 hours) at 05/18/11 0902 Last data filed at 05/18/11 0800  Gross per 24 hour  Intake 1228.56 ml  Output   5660 ml  Net -4431.44 ml   Physical Exam: BP 142/80   Pulse 103   Temp(Src) 98 F (36.7 C) (Oral)   Resp 16   Ht 5' 7.5" (1.715 m)   Wt 103 kg (227 lb 1.2 oz)   BMI 35.04 kg/m2   SpO2 94% General appearance: alert, cooperative and no distress Head: Normocephalic, without obvious abnormality, atraumatic Eyes: conjunctivae/corneas clear. PERRL, EOM's intact. Fundi benign. Lungs: diminished breath sounds bilaterally and with equal air entry, mild rales in posterior inferior lung fields Heart: S1, S2: normal and tachycardic rate, regular rhythm Abdomen: normal findings: bowel sounds normal and soft, nontender, mildly distended with +BSx4 Extremities: extremities normal, atraumatic, no cyanosis, 1+ pitting edema to mid tibia b/l Pulses: 2+ and symmetric Skin: Skin color, texture, turgor normal. No rashes or lesions Neurologic: Grossly normal Lab Results: Basic Metabolic Panel:  Lab [id number] 0445 05/17/11 0810  NA 140 140  K 3.8 3.9  CL 100  103  CO2 26 22  GLUCOSE 112* 166*  BUN 22 14  CREATININE 1.31 1.04  CALCIUM 9.9 9.6  MG -- --  PHOS -- --   Liver Function Tests:  Lab 05/17/11 0810  AST 17  ALT 20  ALKPHOS 68  BILITOT 0.9  PROT 7.7  ALBUMIN 3.3*   CBC:  Lab 05/18/11 0445 05/17/11 0810  WBC 21.1* 14.3*  NEUTROABS -- 11.8*  HGB 12.8* 13.8  HCT 39.0 41.6  MCV 86.7 86.3  PLT 367 367   BNP:  Lab 05/17/11 0810  POCBNP 1672.0*   Hemoglobin A1C:  Lab 05/17/11 1200  HGBA1C 5.9*   Thyroid Function Tests:  Lab 05/17/11 1200  TSH 0.264*  T4TOTAL --  FREET4 --  T3FREE --  THYROIDAB --    Micro Results: Recent Results (from the past 240 hour(s))  CULTURE, SPUTUM-ASSESSMENT     Status: Normal   Collection Time   05/17/11  1:32 PM      Component Value Range Status Comment   Specimen Description SPUTUM   Final    Special Requests NONE   Final    Sputum evaluation     Final    Value: THIS SPECIMEN IS ACCEPTABLE. RESPIRATORY CULTURE REPORT TO FOLLOW.   Report Status 05/17/2011 FINAL   Final   CULTURE, RESPIRATORY     Status:  Normal (Preliminary result)   Collection Time   05/17/11  1:32 PM      Component Value Range Status Comment   Specimen Description SPUTUM   Final    Special Requests NONE   Final    Gram Stain     Final    Value: FEW WBC PRESENT,BOTH PMN AND MONONUCLEAR     FEW SQUAMOUS EPITHELIAL CELLS PRESENT     MODERATE GRAM POSITIVE COCCI IN PAIRS     FEW GRAM POSITIVE RODS   Culture PENDING   Incomplete    Report Status PENDING   Incomplete   MRSA PCR SCREENING     Status: Normal   Collection Time   05/17/11  1:48 PM      Component Value Range Status Comment   MRSA by PCR NEGATIVE  NEGATIVE  Final    Studies/Results: Dg Chest 2 View  05/18/2011  *RADIOLOGY REPORT*  Clinical Data: Shortness of breath, COPD.  CHEST - 2 VIEW  Comparison: 05/17/2011  Findings: Partial clearance of the lungs.  Mild residual bilateral lower lobe opacities.  Heart is normal size.  Small bilateral  effusions on the lateral view.  No acute bony abnormality.  IMPRESSION: Significant improvement in aeration with decreasing bilateral opacities.  Trace effusions.  Original Report Authenticated By: Raelyn Number, M.D.   Dg Chest Portable 1 View  05/17/2011  *RADIOLOGY REPORT*  Clinical Data: 60 year old male with shortness of breath.  PORTABLE CHEST - 1 VIEW  Comparison: 07/02/2010  Findings: Upper limits normal heart size again noted. Moderate bilateral airspace opacities is compatible with pulmonary edema. No definite pleural effusions or pneumothorax noted. No acute bony abnormalities are identified.  IMPRESSION: Moderate pulmonary edema.  Original Report Authenticated By: Lura Em, M.D.   Scheduled Meds:    acetaminophen       allopurinol  100 mg Oral Daily   antiseptic oral rinse  15 mL Mouth Rinse BID   furosemide       furosemide  80 mg Intravenous Once   heparin  5,000 Units Subcutaneous Q8H   ipratropium  0.5 mg Nebulization TID   levalbuterol  0.63 mg Nebulization TID   levofloxacin (LEVAQUIN) IV  750 mg Intravenous Q24H   moxifloxacin  400 mg Intravenous Once   pantoprazole (PROTONIX) IV  40 mg Intravenous QHS   polyethylene glycol  17 g Oral Daily   predniSONE  40 mg Oral QAC breakfast   DISCONTD: albuterol  2.5 mg Nebulization Q4H   DISCONTD: albuterol  5 mg Nebulization Q4H   DISCONTD: furosemide  40 mg Intravenous Q8H   DISCONTD: furosemide  40 mg Intravenous Q8H   DISCONTD: ipratropium  0.5 mg Nebulization Q4H   DISCONTD: ipratropium  0.5 mg Nebulization Q4H   DISCONTD: levalbuterol  0.63 mg Nebulization Q6H   Continuous Infusions:    sodium chloride 10 mL/hr at 05/18/11 0800   DISCONTD: albuterol 15 mg (05/17/11 0820)   DISCONTD: nitroGLYCERIN 15 mcg/min (05/17/11 [id number])   DISCONTD: nitroGLYCERIN 20 mcg/min (05/18/11 0700)   PRN Meds:.acetaminophen, acetaminophen, labetalol, levalbuterol, morphine, senna-docusate, sodium chloride,  DISCONTD: albuterol  Assessment & Plan by Problem:  #SOB - improving Multifactorial including COPD exacerbation, CHF exacerbation considering lower extremity edema, elevated BNP values, basilar rales, and pulmonary edema on CXR as well as possible CAP since patient report of productive cough with bloody and yellow sputum, fevers and chills. Patient has responded well to diuresis, has been afebrile this admission, and CXR performed today shows improvement from  yesterday in line with symptomatic improvement in his SOB and productive cough.   - Transfer patient from Step down to telemetry - Duonebs q4hrs - Albuterol prn d/c and switch to Xopenex prn 2/2 tachycardia - Prednisone taper starting dose with 40 mg - Levoquin for possible CAP  - Will f/u blood culture, sputum gram stain and culture  - Lasix IV 40 mg bid for possible CHF  - Will obtain 2 D Echo   #HTN - stable Blood pressure elevated in the ED. Patient was started initially on Nitro drop which was subsequently d/c since blood pressure was stable int 130s/80s range. Patient had history of HTN but is currently not on any medication.  - Will start Labetalol 5 mg IV q 6hrs prn for SBP >180 and DBP >100  - Will obtain 2 D Echo  - Consider starting ACE and bblocker   #Gout  Chronic condition, will d/c indomethacin. Patient has no current complaints  -continue home med Allopurinol   # DVTppx: Heparin    LOS: 1 day   Rosette Reveal 05/18/2011, 9:02 AM

## 2011-05-19 ENCOUNTER — Encounter (HOSPITAL_COMMUNITY): Payer: Self-pay | Admitting: Physician Assistant

## 2011-05-19 DIAGNOSIS — I5043 Acute on chronic combined systolic (congestive) and diastolic (congestive) heart failure: Secondary | ICD-10-CM

## 2011-05-19 LAB — CULTURE, RESPIRATORY W GRAM STAIN: Culture: NORMAL

## 2011-05-19 LAB — CBC
HCT: 45.5 % (ref 39.0–52.0)
Hemoglobin: 14.5 g/dL (ref 13.0–17.0)
MCV: 88 fL (ref 78.0–100.0)
Platelets: 375 10*3/uL (ref 150–400)
RBC: 5.17 MIL/uL (ref 4.22–5.81)
WBC: 17.8 10*3/uL — ABNORMAL HIGH (ref 4.0–10.5)

## 2011-05-19 LAB — BASIC METABOLIC PANEL
Calcium: 9.9 mg/dL (ref 8.4–10.5)
Creatinine, Ser: 1.27 mg/dL (ref 0.50–1.35)
GFR calc Af Amer: 70 mL/min — ABNORMAL LOW (ref >90)

## 2011-05-19 LAB — OCCULT BLOOD X 1 CARD TO LAB, STOOL: Fecal Occult Bld: NEGATIVE

## 2011-05-19 MED ORDER — METOPROLOL TARTRATE 12.5 MG HALF TABLET
12.5000 mg | ORAL_TABLET | Freq: Two times a day (BID) | ORAL | Status: DC
  Administered 2011-05-19 – 2011-05-20 (×2): 12.5 mg via ORAL
  Filled 2011-05-19 (×3): qty 1

## 2011-05-19 MED ORDER — BISACODYL 5 MG PO TBEC
5.0000 mg | DELAYED_RELEASE_TABLET | Freq: Every day | ORAL | Status: DC
  Administered 2011-05-19 – 2011-05-20 (×2): 5 mg via ORAL
  Filled 2011-05-19 (×2): qty 1

## 2011-05-19 MED ORDER — FUROSEMIDE 20 MG PO TABS
20.0000 mg | ORAL_TABLET | Freq: Every day | ORAL | Status: DC
  Administered 2011-05-20: 20 mg via ORAL
  Filled 2011-05-19: qty 1

## 2011-05-19 MED ORDER — TIOTROPIUM BROMIDE MONOHYDRATE 18 MCG IN CAPS
18.0000 ug | ORAL_CAPSULE | Freq: Every day | RESPIRATORY_TRACT | Status: DC
  Administered 2011-05-20: 18 ug via RESPIRATORY_TRACT
  Filled 2011-05-19: qty 5

## 2011-05-19 MED ORDER — LEVOFLOXACIN 750 MG PO TABS
750.0000 mg | ORAL_TABLET | Freq: Every day | ORAL | Status: DC
  Administered 2011-05-19 – 2011-05-20 (×2): 750 mg via ORAL
  Filled 2011-05-19 (×3): qty 1

## 2011-05-19 MED ORDER — SENNOSIDES-DOCUSATE SODIUM 8.6-50 MG PO TABS
1.0000 | ORAL_TABLET | Freq: Every day | ORAL | Status: DC
  Administered 2011-05-19 – 2011-05-20 (×2): 1 via ORAL
  Filled 2011-05-19 (×3): qty 1

## 2011-05-19 MED ORDER — LISINOPRIL 10 MG PO TABS
10.0000 mg | ORAL_TABLET | Freq: Every day | ORAL | Status: DC
  Administered 2011-05-19 – 2011-05-20 (×2): 10 mg via ORAL
  Filled 2011-05-19 (×2): qty 1

## 2011-05-19 NOTE — Progress Notes (Signed)
Clinical Social Work-PT in need of smoking cessation; CSW discussed appropriate referral with treatment team. No needs at this time. Gerre Scull, [phone]

## 2011-05-19 NOTE — Progress Notes (Signed)
Internal Medicine MS4 Progress Note  Subjective: [name] [name] was seen and examined sitting up in room chair this morning. He said that he slept well and denied any acute adverse overnight events. He states that his SOB continues to improve and feels close to his baseline at home. He stated that he has not been having a productive cough since yesterday. He denies any headache and continues to deny chest pain, nausea, or vomiting.   Objective: Vital signs in last 24 hours: Filed Vitals:   05/18/11 2208 05/19/11 0223 05/19/11 0610 05/19/11 0700  BP: 147/99  157/105 151/106  Pulse: 98  87 102  Temp: 98 F (36.7 C)  97.6 F (36.4 C)   TempSrc: Oral  Oral   Resp: 18  19   Height:      Weight:  102.513 kg (226 lb)    SpO2: 96%  96%    Weight change: -2.687 kg (-5 lb 14.8 oz)  Intake/Output Summary (Last 24 hours) at 05/19/11 0850 Last data filed at 05/19/11 0831  Gross per 24 hour  Intake 1048.5 ml  Output   1475 ml  Net -426.5 ml   Physical Exam: General appearance: alert, cooperative and no distress  Head: Normocephalic, without obvious abnormality, atraumatic, nasal canula in place  Eyes: conjunctivae/corneas clear. PERRL, EOM's intact. Lungs: mildly decreased breath sounds bilaterally with equal air entry, mild expiratory rhonchi in posterior Right lower field  Heart: +S1,S2 tachycardic rate, regular rhythm  Abdomen: normal findings: bowel sounds normal and soft, nontender, mildly distended with +BSx4  Extremities: extremities normal, atraumatic, no cyanosis, no edema  Pulses: 2+ and symmetric  Skin: Skin color, texture, turgor normal. No rashes or lesions  Neurologic: Grossly normal  Lab Results: Basic Metabolic Panel:  Lab [id number] 0445 05/17/11 0810  NA 140 140  K 3.8 3.9  CL 100 103  CO2 26 22  GLUCOSE 112* 166*  BUN 22 14  CREATININE 1.31 1.04  CALCIUM 9.9 9.6  MG -- --  PHOS -- --   Liver Function Tests:  Lab 05/17/11 0810  AST 17  ALT 20  ALKPHOS 68    BILITOT 0.9  PROT 7.7  ALBUMIN 3.3*   CBC:  Lab 05/19/11 0607 05/18/11 0445 05/17/11 0810  WBC 17.8* 21.1* --  NEUTROABS -- -- 11.8*  HGB 14.5 12.8* --  HCT 45.5 39.0 --  MCV 88.0 86.7 --  PLT 375 367 --   Hemoglobin A1C:  Lab 05/17/11 1200  HGBA1C 5.9*   Thyroid Function Tests:  Lab 05/17/11 1200  TSH 0.264*  T4TOTAL --  FREET4 --  T3FREE --  THYROIDAB --   Micro Results: Recent Results (from the past 240 hour(s))  CULTURE, BLOOD (ROUTINE X 2)     Status: Normal (Preliminary result)   Collection Time   05/17/11  8:10 AM      Component Value Range Status Comment   Specimen Description BLOOD LEFT ARM   Final    Special Requests BOTTLES DRAWN AEROBIC ONLY 10CC   Final    Setup Time [id number]   Final    Culture     Final    Value:        BLOOD CULTURE RECEIVED NO GROWTH TO DATE CULTURE WILL BE HELD FOR 5 DAYS BEFORE ISSUING A FINAL NEGATIVE REPORT   Report Status PENDING   Incomplete   CULTURE, BLOOD (ROUTINE X 2)     Status: Normal (Preliminary result)   Collection Time   05/17/11 12:00 PM  Component Value Range Status Comment   Specimen Description BLOOD LEFT HAND   Final    Special Requests BOTTLES DRAWN AEROBIC AND ANAEROBIC 10CC   Final    Setup Time [id number]   Final    Culture     Final    Value:        BLOOD CULTURE RECEIVED NO GROWTH TO DATE CULTURE WILL BE HELD FOR 5 DAYS BEFORE ISSUING A FINAL NEGATIVE REPORT   Report Status PENDING   Incomplete   CULTURE, SPUTUM-ASSESSMENT     Status: Normal   Collection Time   05/17/11  1:32 PM      Component Value Range Status Comment   Specimen Description SPUTUM   Final    Special Requests NONE   Final    Sputum evaluation     Final    Value: THIS SPECIMEN IS ACCEPTABLE. RESPIRATORY CULTURE REPORT TO FOLLOW.   Report Status 05/17/2011 FINAL   Final   CULTURE, RESPIRATORY     Status: Normal (Preliminary result)   Collection Time   05/17/11  1:32 PM      Component Value Range Status Comment    Specimen Description SPUTUM   Final    Special Requests NONE   Final    Gram Stain     Final    Value: FEW WBC PRESENT,BOTH PMN AND MONONUCLEAR     FEW SQUAMOUS EPITHELIAL CELLS PRESENT     MODERATE GRAM POSITIVE COCCI IN PAIRS     FEW GRAM POSITIVE RODS   Culture Culture reincubated for better growth   Final    Report Status PENDING   Incomplete   MRSA PCR SCREENING     Status: Normal   Collection Time   05/17/11  1:48 PM      Component Value Range Status Comment   MRSA by PCR NEGATIVE  NEGATIVE  Final    Studies/Results: 2D Echocardiogram Study Conclusions  - Left ventricle: There was mild concentric hypertrophy. Systolic function was mildly reduced. The estimated ejection fraction was in the range of 45% to 50%. Moderate hypokinesis of the basalinferior myocardium. Doppler parameters are consistent with a reversible restrictive pattern, indicative of decreased left ventricular diastolic compliance and/or increased left atrial pressure (grade 3 diastolic dysfunction). Doppler parameters are consistent with high ventricular filling pressure. - Mitral valve: Calcified annulus. Mildly thickened leaflets . Mild regurgitation. - Left atrium: The atrium was mildly dilated.   Medications: I have reviewed the patient's current medications. Scheduled Meds:    allopurinol  100 mg Oral Daily   antiseptic oral rinse  15 mL Mouth Rinse BID   heparin  5,000 Units Subcutaneous Q8H   ipratropium  0.5 mg Nebulization TID   levalbuterol  0.63 mg Nebulization TID   levofloxacin (LEVAQUIN) IV  750 mg Intravenous Q24H   pantoprazole  40 mg Oral QHS   polyethylene glycol  17 g Oral Daily   predniSONE  40 mg Oral QAC breakfast   DISCONTD: furosemide  40 mg Intravenous Q8H   DISCONTD: pantoprazole (PROTONIX) IV  40 mg Intravenous QHS   Continuous Infusions:    DISCONTD: sodium chloride Stopped (05/18/11 1315)   DISCONTD: nitroGLYCERIN 15 mcg/min (05/18/11 0900)   PRN  Meds:.acetaminophen, acetaminophen, labetalol, levalbuterol, morphine, senna-docusate, sodium chloride  Assessment & Plan by Problem:  #SOB - resolving vs resolved Multifactorial including COPD exacerbation, CHF exacerbation considering lower extremity edema, elevated BNP values, basilar rales, and pulmonary edema on CXR as well as possible CAP since patient  report of productive cough with bloody and yellow sputum, fevers and chills. Patient has responded well to diuresis, has been afebrile this admission, and CXR performed 11/26 showed improvement from admission in line with symptomatic improvement in his SOB and productive cough. Patient says he is near/at his baseline at home. 2D echo performed shows EF 45-50%, PCP has no prior studies. - Duonebs q4hrs  - Xopenex prn - Prednisone taper starting dose with 40 mg  - transition to Levoquin PO - Blood culture no growth x 2 days, sputum culture prelim shows gram + cocci in pairs  #CHF Patient has no listed prior history of heart failure and no previous studies on record here or at his PCP in [place], [place]. On initial presentation patient had lower extremity swelling and an elevated BNP so CHF workup was pursued. A 2D Echo performed 11/26 read EF 45-50% and grade 3 diastolic dysfunction. Patient had already been diuresed with lasix and responded well symptomatically and had a creatinine bump so lasix was d/c'd. -Cardiology consulted, greatly appreciated. Will f/u recommendations  #HTN - stable  Blood pressure elevated in the ED. Patient was started initially on Nitro drop which was subsequently d/c since blood pressure was stable but elevated in the 140-150/88-100 range. A 2d echo was performed with results mentioned above. Patient had history of HTN but is currently not on any medication.  - Will start Labetalol 5 mg IV q 6hrs prn for SBP >180 and DBP >100  - Begin ACE (lisinopril 10mg )   #Gout  Chronic condition, will d/c indomethacin. Patient has  no current complaints  -continue home med Allopurinol   # DVTppx: Heparin    LOS: 2 days   Rosette Reveal 05/19/2011, 8:50 AM

## 2011-05-19 NOTE — Consult Note (Signed)
Cardiology Consult Note   Patient ID: [name] [name] MRN: [id number], DOB/AGE: 09-27-1950   Admit date: 05/17/2011 Date of Consult: 05/19/2011  Primary Physician: [name], [place], [place] Primary Cardiologist: None  Pt. Profile: [name] [name] is 60 yo male with PMHx significant for COPD, tobacco abuse, HTN and HL admitted to [facility] 11/25 for COPD exacerbation, acute diastolic CHF and CAP contributing to 3 days of worsening shortness of breath. Cardiology consult requested for CHF management.   Problem List: Past Medical History  Diagnosis Date  . COPD (chronic obstructive pulmonary disease)   . Gout   . Emphysema   . Hypertension   . Hyperlipidemia   . Tobacco abuse     30+ pack-year history    Past Surgical History  Procedure Date  . Appendectomy as a child     Allergies:  Allergies  Allergen Reactions  . Penicillins Other (See Comments)    Swelling , long time ago.     HPI:   Pt reports feeling short of breath on 11/23 with associated productive cough consisting of yellowish sputum with streaks of blood. His home nebulizer treatments for COPD improved his shortness of breath somewhat but he later presented to the ED. He was assessed and discharged with steroids and doxycycline. The shortness of breath continued to worsen. He reports associated DOE, PND, orthopnea, nocturia x3 and increased lower extremity and facial edema. He also reports fevers chills and night sweats. He denies previous cardiac history and workup. He endorses a 30+ pack year history. He denies chest pain, palpitations, diaphoresis, lightheadedness, nausea, vomiting and abdominal pain.   He was [facility] via EMS. He reports oxygen saturation at 79% at home, 96% while in transit. In the ED BNP was elevated at 1672. Chest x-ray revealed moderate pulmonary edema. EKG was sinus tachycardia at 134 bpm, no ischemic changes. Point-of-care troponin I was negative at 0.02. He was found to have a  leukocytosis of 14.3. He was admitted with multifactorial dyspnea in COPD exacerbation, CAP and acute CHF.   Echo during admission revealed EF of 45-50% with grade 3 diastolic dysfunction and mild hypokinesis of basalinferiour myocardium. Pt was started on Lasix IV 40mg . CXR 11/26 revealed significant improvement in aeration with decreased opacities. He is -5.14 L today. Lasix discontinued today due to Cr elevation.   Clinically feels better today, with improved shortness of breath and lower extremity edema. He denies productive cough. He reports much improvement after nebulizer treatments.   Inpatient Medications:     . allopurinol  100 mg Oral Daily  . antiseptic oral rinse  15 mL Mouth Rinse BID  . bisacodyl  5 mg Oral Daily  . heparin  5,000 Units Subcutaneous Q8H  . ipratropium  0.5 mg Nebulization TID  . levalbuterol  0.63 mg Nebulization TID  . levofloxacin  750 mg Oral Daily  . lisinopril  10 mg Oral Daily  . pantoprazole  40 mg Oral QHS  . polyethylene glycol  17 g Oral Daily  . predniSONE  40 mg Oral QAC breakfast  . senna-docusate  1 tablet Oral Daily  . DISCONTD: levofloxacin (LEVAQUIN) IV  750 mg Intravenous Q24H    History reviewed. No pertinent family history.   History   Social History  . Marital Status: Married    Spouse Name: N/A    Number of Children: N/A  . Years of Education: N/A   Occupational History  . Not on file.   Social History Main Topics  .  Smoking status: Former Smoker -- 1.0 packs/day x 30+ years  . Smokeless tobacco: Not on file  . Alcohol Use: No  . Drug Use: No  . Sexually Active:    Other Topics Concern  . Not on file   Social History Narrative  . No narrative on file     Review of Systems: General: positive for chills, fever and night sweats Cardiovascular: negative for chest pain, palpitations, positive for dyspnea on exertion, edema, orthopnea, paroxysmal nocturnal dyspnea and shortness of breath Dermatological: negative for  rash Respiratory: positive for cough and wheezing Urologic: negative for hematuria Abdominal: negative for nausea, vomiting, diarrhea, positive for bright red blood per rectum, melena Neurologic: negative for visual changes, syncope, or dizziness All other systems reviewed and are otherwise negative except as noted above.  Physical Exam: Blood pressure 114/77, pulse 105, temperature 98.1 F (36.7 C), temperature source Oral, resp. rate 20, height 5' 7.5" (1.715 m), weight 102.513 kg (226 lb), SpO2 95.00%.   General: Well developed, well nourished, NAD Head: Normocephalic, atraumatic, sclera non-icteric Neck: Negative for carotid bruits. JVD difficult to appreciate due to body habitus. Lungs: Breathing is unlabored. Trace bibasilar rales, no wheezes or rhonchi. Heart: RRR with S1 S2. No murmurs, rubs, or gallops appreciated. Abdomen: Soft, non-tender, moderately distended with normoactive bowel sounds. No hepatomegaly. No rebound/guarding. No obvious abdominal masses. Msk:  Strength and tone appears normal for age. Extremities: No clubbing, cyanosis or edema.  Distal pedal pulses are 2+ and equal bilaterally. Neuro: Alert and oriented X 3. Moves all extremities spontaneously. Psych:  Responds to questions appropriately with a normal affect.  Labs:   Lab Results  Component Value Date   WBC 17.8* 05/19/2011   HGB 14.5 05/19/2011   HCT 45.5 05/19/2011   MCV 88.0 05/19/2011   PLT 375 05/19/2011     Lab 05/19/11 0607 05/17/11 0810  NA 142 --  K 4.1 --  CL 102 --  CO2 27 --  BUN 25* --  CREATININE 1.27 --  CALCIUM 9.9 --  PROT -- 7.7  BILITOT -- 0.9  ALKPHOS -- 68  ALT -- 20  AST -- 17  GLUCOSE 87 --    Radiology/Studies: Dg Chest 2 View  05/18/2011  *RADIOLOGY REPORT*  Clinical Data: Shortness of breath, COPD.  CHEST - 2 VIEW  Comparison: 05/17/2011  Findings: Partial clearance of the lungs.  Mild residual bilateral lower lobe opacities.  Heart is normal size.  Small  bilateral effusions on the lateral view.  No acute bony abnormality.  IMPRESSION: Significant improvement in aeration with decreasing bilateral opacities.  Trace effusions.  Original Report Authenticated By: Raelyn Number, M.D.   Dg Chest Portable 1 View  05/17/2011  *RADIOLOGY REPORT*  Clinical Data: 60 year old male with shortness of breath.  PORTABLE CHEST - 1 VIEW  Comparison: 07/02/2010  Findings: Upper limits normal heart size again noted. Moderate bilateral airspace opacities is compatible with pulmonary edema. No definite pleural effusions or pneumothorax noted. No acute bony abnormalities are identified.  IMPRESSION: Moderate pulmonary edema.  Original Report Authenticated By: Lura Em, M.D.    EKG:   Sinus tachycardia at 134 bpm, RBBB, nonspecific ST-T wave changes   Tele: sinus tachycardia at 100-120 bpm, nonspecific ST-T wave changes  ASSESSMENT AND PLAN:   1. Acute mixed CHF- Echo 11/26 revealed EF [id number], grade 3 diastolic dysfunction and moderate hypokinesis of the basalinferior myocardium; diuresed well by primary team, net - 5.14 L today, pulmonary edema resolving per CXR,  clinically improving; already on ACE-I, may consider adding BB as pt is tachycardic also (likely due to inflammatory response). May benefit from small home Lasix dose on discharge with potassium supplementation and ACE-I. Echo concerning for history of ischemia, will discuss follow-up for stress perfusion study most likely.   Note: Wife arrived midway through the exam, and reported he also has hyperlipidemia and anemia. Upon further questioning, pt has bloody stools. Normal colonoscopy 3 weeks ago with polyps removed per patient. States he has an endoscopy scheduled in the near future. Not currently anemic, would recommend follow-up with PCP.   Signed, Valeria Batman, PA-C 05/19/2011, 3:12 PM    History reviewed with the patient, no changes to be made.  Admitted with probable pneumonia.  proBNP  elevated.  Echo EF slightly low with wall motion abnormality.  EKG unremarkable.  Enzymes negative.  No chest pain.  The patient exam reveals no crackles.  No edema.  No murmur.  All available labs, radiology testing, previous records reviewed. Agree with documented assessment and plan.  Plan outpatient stress test after he has recovered from the acute events.  Primary risk reduction.  Start beta blocker if no contraindication from a pulmonary standpoint.  Continue ACE inhibitor.   Jeneen Rinks Caira Poche  3:41 PM 05/08/2011

## 2011-05-19 NOTE — Progress Notes (Signed)
Pt had 6 beats run of SVT at about 1430 today.  Asymptomatic, BP 123/83, P 101.  [employer] student received call informed of above & will inform MD on call.   Will cont. To monitor.

## 2011-05-19 NOTE — Progress Notes (Signed)
Patient ID: [name] [name], male   DOB: 24-Nov-1950, 60 y.o.   MRN: [id number] Internal Medicine Teaching Service Attending Note Date: 05/19/2011  Patient name: [name] [name]  Medical record number: [id number]  Date of birth: July 11, 1950    This patient has been seen and discussed with the house staff. Please see their note for complete details. I concur with their findings with the following additions/corrections: He is improving- back to baseline O2. Will change his anbx to po, continue steroid taper. contiue diuresis, afterload reduction.  Bobby Rumpf 05/19/2011, 10:14 AM

## 2011-05-19 NOTE — Progress Notes (Signed)
I have read and agree with Gwinda [place] note.  Physical Exam:  General appearance: alert, cooperative and no distress  Lungs:  Improved air movement bilaterally .  Mild expiratory rhonchi in posterior Right lower field  Heart: +S1,S2 tachycardic rate, regular rhythm  Abdomen: normal findings: bowel sounds normal and soft, nontender, mildly distended with +BSx4  Extremities: extremities normal, atraumatic, no cyanosis, no edema  Pulses: 2+ and symmetric  Skin: Skin color, texture, turgor normal. No rashes or lesions  Neurologic: Grossly normal  Assessment & Plan:  #SOB - resolving vs resolved  Multifactorial including COPD exacerbation, CHF exacerbation as well as  CAP Patient has responded well to diuresis, has been afebrile since  admission, and CXR performed 11/26 showed improvement from admission in line with symptomatic improvement in his SOB and productive cough. Patient says he is near/at his baseline at home. 2D echo performed shows EF 45-50%, PCP has no prior studies.  - continue Duonebs q4hrs  - Xopenex prn  - Prednisone taper starting dose with 40 mg  - transition to Levoquin PO  - Blood culture no growth x 2 days, sputum culture prelim shows gram + cocci in pairs   #CHF  Patient has no listed prior history of heart failure and no previous studies on record here or at his PCP in [place], [place]. On initial presentation patient had lower extremity swelling and an elevated BNP so CHF workup was pursued. A 2D Echo performed 11/26 read EF 45-50% with mild hyperkinesis and grade 3 diastolic dysfunction. Patient had already been diuresed with lasix and responded well symptomatically and had a creatinine bump so lasix was d/c'd.  -Cardiology consulted, greatly appreciated. Will f/u recommendations   #HTN - stable  Blood pressure elevated in the ED. Patient was started initially on Nitro drop which was subsequently d/c since blood pressure was stable but elevated in the 140-150/88-100  range. A 2d echo was performed with results mentioned above. Patient had history of HTN but is currently not on any medication.  - Will start Labetalol 5 mg IV q 6hrs prn for SBP >180 and DBP >100  - Begin ACE (lisinopril 10mg ) and considering bblocker as an outpatient after the acute phase of COPD exacerbation. Patient would definitely benefit considering tachycardia and CHF   #Gout  Chronic condition, will d/c indomethacin. Patient has no current complaints  -continue home med Allopurinol   # DVTppx: Heparin   Rosalia Hammers , MD

## 2011-05-20 DIAGNOSIS — R0609 Other forms of dyspnea: Secondary | ICD-10-CM

## 2011-05-20 DIAGNOSIS — R0989 Other specified symptoms and signs involving the circulatory and respiratory systems: Secondary | ICD-10-CM

## 2011-05-20 LAB — CBC
MCV: 88.2 fL (ref 78.0–100.0)
Platelets: 334 10*3/uL (ref 150–400)
RDW: 16.3 % — ABNORMAL HIGH (ref 11.5–15.5)
WBC: 15.4 10*3/uL — ABNORMAL HIGH (ref 4.0–10.5)

## 2011-05-20 LAB — BASIC METABOLIC PANEL
CO2: 25 mEq/L (ref 19–32)
Calcium: 9.7 mg/dL (ref 8.4–10.5)
Creatinine, Ser: 1.1 mg/dL (ref 0.50–1.35)
GFR calc Af Amer: 83 mL/min — ABNORMAL LOW (ref >90)

## 2011-05-20 MED ORDER — METOPROLOL TARTRATE 12.5 MG HALF TABLET: 12.5000 mg | ORAL_TABLET | Freq: Two times a day (BID) | ORAL | Status: DC

## 2011-05-20 MED ORDER — MINERAL OIL RE ENEM
1.0000 | ENEMA | Freq: Once | RECTAL | Status: DC
  Filled 2011-05-20: qty 1

## 2011-05-20 MED ORDER — LISINOPRIL 10 MG PO TABS: 10.0000 mg | ORAL_TABLET | Freq: Every day | ORAL | Status: DC

## 2011-05-20 MED ORDER — LEVOFLOXACIN 500 MG PO TABS: 500.0000 mg | ORAL_TABLET | Freq: Every day | ORAL | Status: AC

## 2011-05-20 MED ORDER — TIOTROPIUM BROMIDE MONOHYDRATE 18 MCG IN CAPS: 18.0000 ug | ORAL_CAPSULE | Freq: Every day | RESPIRATORY_TRACT | Status: DC

## 2011-05-20 MED ORDER — FUROSEMIDE 20 MG PO TABS: 20.0000 mg | ORAL_TABLET | Freq: Every day | ORAL | Status: DC

## 2011-05-20 MED ORDER — PREDNISONE 20 MG PO TABS: 40.0000 mg | ORAL_TABLET | Freq: Every day | ORAL | Status: AC

## 2011-05-20 MED ORDER — POLYETHYLENE GLYCOL 3350 17 G PO PACK: 17.0000 g | PACK | Freq: Every day | ORAL | Status: AC

## 2011-05-20 NOTE — Discharge Summary (Signed)
[facility] Discharge Note  Name: [name] [name] MRN: [id number] DOB: 08/25/1950 60 y.o.  Date of Admission: 05/17/2011  7:53 AM Date of Discharge: 05/20/2011 Attending Physician: Bobby Rumpf, MD  Discharge Diagnosis:  1. SOB - resolved. Multifactorial CHF and COPD exacerbation , Pneumonia .  2. COPD- no PFT on file  3. Community acquired pneumonia - on Levaquin 4. CHF with EF of 45-50 % with grade 3 diastolic dysfunction, newly diagnosed   5. HTN 6. Gout    Discharge Medications: Current Discharge Medication List    START taking these medications   Details  furosemide (LASIX) 20 MG tablet Take 1 tablet (20 mg total) by mouth daily. Qty: 30 tablet, Refills: 0    levofloxacin (LEVAQUIN) 500 MG tablet Take 1 tablet (500 mg total) by mouth daily. Qty: 4 tablet, Refills: 0    lisinopril (PRINIVIL,ZESTRIL) 10 MG tablet Take 1 tablet (10 mg total) by mouth daily. Qty: 30 tablet, Refills: 0    metoprolol tartrate (LOPRESSOR) 12.5 mg TABS Take 0.5 tablets (12.5 mg total) by mouth 2 (two) times daily. Qty: 60 tablet, Refills: 0    polyethylene glycol (MIRALAX / GLYCOLAX) packet Take 17 g by mouth daily. Qty: 14 each, Refills: 0    tiotropium (SPIRIVA) 18 MCG inhalation capsule Place 1 capsule (18 mcg total) into inhaler and inhale daily. Qty: 30 capsule, Refills: 0      CONTINUE these medications which have CHANGED   Details  predniSONE (DELTASONE) 20 MG tablet Take 2 tablets (40 mg total) by mouth daily before breakfast. Take 40 mg on 11/29, then  take 30 mg for 3 days, then take 20 mg for 3 days, then take 10 mg for 3 days and then stop. Qty: 10 tablet, Refills: 0      CONTINUE these medications which have NOT CHANGED   Details  albuterol (PROVENTIL HFA;VENTOLIN HFA) 108 (90 BASE) MCG/ACT inhaler Inhale 2 puffs into the lungs every 4 (four) hours as needed. For shortness of breath     allopurinol (ZYLOPRIM) 100 MG tablet Take 100 mg by mouth  daily.        STOP taking these medications     doxycycline (VIBRAMYCIN) 100 MG capsule      indomethacin (INDOCIN) 50 MG capsule         Disposition and follow-up:   Mr.[name] [name] was discharged from [facility] in Good condition.    [name] will follow-up at [facility] with [name], [place] on 05/27/11 at 1:45pm. 1.  During this visit the patient's SOB, response to antibiotics for CAP, hypertension, newly diagnosed CHF, 2 positive fecal occult blood tests, and chronic gout should be assessed. Patient was started on Lisinopril 10 mg and Metoprolol 12.5 mg bid.  2. Patient was newly diagnosed with CHF during this hospital admission . Please scheduled  cardiac stress test as an outpatient and optimize medical management .  3. A repeat CXR should be performed in 4-6 weeks to evaluate a right lower lobe opacity noted during this admission to follow up  response of  antibiotic therapy and rule out potential malignancy.  4. Positive occult blood test x2 with stable Hemoglobin. Please follow up as an outpatient for further work up.  4. The patient and his family also stated a desire to have more education on nutrition/dietary recommendations for his gout. A hand out was given.   Follow-up Appointments: Follow-up Information    Follow up with [facility] , [name] , [place]  on 05/27/2011. (At 1:45 pm. Phone number [phone] )         Discharge Orders    Future Orders Please Complete By Expires   Diet - low sodium heart healthy      Increase activity slowly      Discharge instructions      Comments:   1. Please follow up with your primary care.  2. Please call the clinic if you have any questions concerning your medication. The phone number is [phone]   Call MD for:  temperature >100.4      Call MD for:  difficulty breathing, headache or visual disturbances      (HEART FAILURE PATIENTS) Call MD:  Anytime you have any of the  following symptoms: 1) 3 pound weight gain in 24 hours or 5 pounds in 1 week 2) shortness of breath, with or without a dry hacking cough 3) swelling in the hands, feet or stomach 4) if you have to sleep on extra pillows at night in order to breathe.         Consultations: Cardiology ,  Dr Minus Breeding was consulted for recommendation of newly diagnosed CHF who recommended to optimize medical therapy and outpatient stress test.    Procedures Performed:  Dg Chest 2 View  05/18/2011  *RADIOLOGY REPORT*  Clinical Data: Shortness of breath, COPD.  CHEST - 2 VIEW  Comparison: 05/17/2011  Findings: Partial clearance of the lungs.  Mild residual bilateral lower lobe opacities.  Heart is normal size.  Small bilateral effusions on the lateral view.  No acute bony abnormality.  IMPRESSION: Significant improvement in aeration with decreasing bilateral opacities.  Trace effusions.  Original Report Authenticated By: Raelyn Number, M.D.   Dg Chest Portable 1 View  05/17/2011  *RADIOLOGY REPORT*  Clinical Data: 60 year old male with shortness of breath.  PORTABLE CHEST - 1 VIEW  Comparison: 07/02/2010  Findings: Upper limits normal heart size again noted. Moderate bilateral airspace opacities is compatible with pulmonary edema. No definite pleural effusions or pneumothorax noted. No acute bony abnormalities are identified.  IMPRESSION: Moderate pulmonary edema.  Original Report Authenticated By: Lura Em, M.D.    2D Echo: Left ventricle: There was mild concentric hypertrophy. Systolic function was mildly reduced. The estimated ejection fraction was in the range of 45% to 50%. Moderate hypokinesis of the basalinferior myocardium. Doppler parameters are consistent with a reversible restrictive pattern, indicative of decreased left ventricular diastolic compliance and/or increased left atrial pressure (grade 3 diastolic dysfunction). Doppler parameters are consistent with high ventricular filling pressure.  Mitral valve: Calcified annulus. Mildly thickened leaflets. Mild regurgitation. Left atrium: The atrium was mildly dilated.   Admission HPI:   [name] is a 60 year old AA male with PMH significant for COPD and gout presenting with 3 day history of worsening SOB. He was in his usual state of health until Friday 11/23 when he began having SOB with a cough productive of yellowish sputum with streaks of blood. His home nebulizer treatments only slightly improved his worsening SOB and he went to the [facility] ED the same day. Upon discharge that day he was given steroids and doxycycline. His SOB continued to worsen despite home O2 and nebulizer use and was transported to hospital via EMS today 11/25 with 79% O2 saturation at home and 96% en route. Patient reports that his wife had an upper airway infection but denies any other sick contact or recent travel. Denies any  chest pain, dizziness , nausea or vomiting.  In the ED patient was given solumedrol 125 mg IV, continuous nebulizer of 15 mg albuterol, 0.5 mg atrovent over 1 hour, and lasix 60 mg IV. Patient and started on bipap and nitrodrip secondary to continued hypertension since arrival. When the IM admitting team arrived he had been off of bipap with 4L [place] in place and O2 saturation 98-99%.   Physical Exam:  Blood pressure 133/78, pulse 121, temperature 98.1 F (36.7 C), temperature source Oral, resp. rate 42, SpO2 97.00%.  Constitutional: Vital signs reviewed. Patient is a well-developed and well-nourished in mild distress secondary to increased work of breathing, and cooperative with exam. Alert and oriented x3.  Head: Normocephalic and atraumatic, nasal canula in place, mild erythema in distribution of bipap mask  Ear: TM normal bilaterally  Mouth: no erythema or exudates, poor dentition  Eyes: PERRL, EOMI, conjunctivae normal, No scleral icterus.  Neck: Supple, Trachea midline normal ROM, No JVD, mass, thyromegaly, or carotid bruit present.    Cardiovascular: RRR, S1 normal, S2 normal, no MRG, pulses symmetric and intact bilaterally  Pulmonary/Chest: increased inspiratory effort, equal air entry b/l with diffuse coarse breath sounds. Rales noted lower right anterior field.  Abdominal: Soft. Non-tender, mildly distended, bowel sounds are normal, no masses, organomegaly, or guarding present.  GU: no CVA tenderness Musculoskeletal: non-tender swelling of Left ankle, b/l pitting edema to knees, unable to fully close left hand 2/2 stiffness, no joint deformities, erythema Neurological: A&O x3, Strenght is normal and symmetric bilaterally, cranial nerve II-XII are grossly intact, no focal motor deficit, sensory intact to light touch bilaterally.  Skin: Cool, dry and intact. No rash, cyanosis, or clubbing.   Lab results:  Basic Metabolic Panel:  [facility]  05/17/11 0810   NA  140   K  3.9   CL  103   CO2  22   GLUCOSE  166*   BUN  14   CREATININE  1.04   CALCIUM  9.6   MG  --   PHOS  --   Liver Function Tests:  [facility]  05/17/11 0810   AST  17   ALT  20   ALKPHOS  68   BILITOT  0.9   PROT  7.7   ALBUMIN  3.3*   CBC:  Basename  05/17/11 0810   WBC  14.3*   NEUTROABS  11.8*   HGB  13.8   HCT  41.6   MCV  86.3   PLT  367   BNP:  Basename  05/17/11 0810   POCBNP  1672.0*     Hospital Course by problem list:   1. SOB - resolved Multifactorial including COPD exacerbation, CHF exacerbation, and CAP. Differential was considered citing patients SOB, cough productive of blood-tinged sputum, sick contact, lower extremity edema, elevated BNP values, basilar rales, and pulmonary edema on CXR. Other differentials included ACS although was unlikely since patient denied any chest pain,  troponin was negative, and he had no ECG changes . PE was also included in the DD but modified Wells score of 1.5 made PE a low probability. Patient was admitted to Step down unit initially since patient required on presentation Bipap due to sever  respiratory distress. Patient received  duonebs every 4 hours, Solumedrol 125 mg IV times one and then prednisone 40 mg  taper, levoquin IV for CAP, and Lasix for volume overload.  A CXR was performed the next day which showed significant improvement in line with his dramatic  symptom improvement. His prn albuterol was changed to xopenex secondary to tachycardia. Patient was transferred to telemetry unit from step-down. After a creatinine bump, and symptomatic improvement, Lasix was discontinued. The initial blood cultures that were drawn showed no growth and the sputum culture had a preliminary finding of gram positive cocci in pairs.The levoquin was transitioned to PO. The patient remained afebrile during this admission and responded well to selected therapy and was discharged in stable condition. 2.   2. HTN - stable  Blood pressure was in the 200s/100s elevated in the ED. Patient was initially started on a Nitro drop which was subsequently d/c'd since blood pressure was stable but elevated in the 140-150/88-100 range. Patient had history of HTN but was not on any home medication. Considering newly diagnosed CHF patient was started on Lisinopril 10mg  and  Metoprolol 12.5 bid.  Blood pressure well controlled on day of discharge. This should be monitored closely and blood pressure control should be maximized in the setting of CHF. .  3. CHF Patient had no listed prior history of heart failure and no previous studies on record here or at his PCP in [place], [place]. On initial presentation patient had lower extremity swelling and an elevated BNP so CHF workup was pursued. A 2D Echo performed 11/26 read EF 45-50% and a grade 3 diastolic dysfunction. Patient had already been diuresed with lasix and responded well symptomatically. Cardiology was consulted and suggested starting an ACE-I, a beta blocker, giving a small home dose of Lasix and stress test as an outpatient .   4. Positive FOBT x 2 Patient had a  positive fecal occult blood test at the [place], [place] ED but patient denied any abdominal pain or seeing blood in any of his stools. A repeat bedside FOBT was performed which was positive and another sample was sent to the lab which was negative. His hemoglobin and hematocrit have been stable since admission and he has continued to deny any GI complaints. Patient stated having a recent colonoscopy which, per wife, only had a few polyps which were removed. Patient also said he has a scheduled EGD. This should be followed up.   5. Gout, stable during hospital admission.  During hospital admission indomethacin was discontinued but home dose of Allopurinol continued. He has been asymptomatic in regards to flares of gout including tender, warm, swollen joints and has denied any complaints. Patient and spouse were educated and given patient information on gout diet but also stated interest in obtaining detailed information on dietary recommendations as an outpatient as well.  Discharge Vitals:  BP 118/81  Pulse 85  Temp(Src) 98 F (36.7 C) (Oral)  Resp 18  Ht 5' 7.5" (1.715 m)  Wt 229 lb 11.5 oz (104.2 kg)  BMI 35.45 kg/m2  SpO2 92%  Discharge Labs:  Results for orders placed during the hospital encounter of 05/17/11 (from the past 24 hour(s))  OCCULT BLOOD X 1 CARD TO LAB, STOOL     Status: Normal   Collection Time   05/19/11  4:52 PM      Component Value Range   Fecal Occult Bld NEGATIVE    BASIC METABOLIC PANEL     Status: Abnormal   Collection Time   05/20/11  5:56 AM      Component Value Range   Sodium 138  135 - 145 (mEq/L)   Potassium 4.1  3.5 - 5.1 (mEq/L)   Chloride 100  96 - 112 (mEq/L)   CO2 25  19 -  32 (mEq/L)   Glucose, Bld 116 (*) 70 - 99 (mg/dL)   BUN 23  6 - 23 (mg/dL)   Creatinine, Ser 1.10  0.50 - 1.35 (mg/dL)   Calcium 9.7  8.4 - 10.5 (mg/dL)   GFR calc non Af Amer 72 (*) >90 (mL/min)   GFR calc Af Amer 83 (*) >90 (mL/min)  CBC     Status: Abnormal   Collection Time    05/20/11  5:56 AM      Component Value Range   WBC 15.4 (*) 4.0 - 10.5 (K/uL)   RBC 4.83  4.22 - 5.81 (MIL/uL)   Hemoglobin 13.9  13.0 - 17.0 (g/dL)   HCT 42.6  39.0 - 52.0 (%)   MCV 88.2  78.0 - 100.0 (fL)   MCH 28.8  26.0 - 34.0 (pg)   MCHC 32.6  30.0 - 36.0 (g/dL)   RDW 16.3 (*) 11.5 - 15.5 (%)   Platelets 334  150 - 400 (K/uL)  PRO B NATRIURETIC PEPTIDE     Status: Abnormal   Collection Time   05/20/11  9:05 AM      Component Value Range   BNP, POC 368.9 (*) 0 - 125 (pg/mL)    Signed: Rosalia Hammers 05/20/2011, 11:39 AM

## 2011-05-20 NOTE — Consult Note (Signed)
   SUBJECTIVE:  Denies chest pain   PHYSICAL EXAM Filed Vitals:   05/19/11 1405 05/19/11 2011 05/19/11 2058 05/20/11 0538  BP:   98/63 142/90  Pulse:   101 78  Temp:   97.6 F (36.4 C) 97.8 F (36.6 C)  TempSrc:   Oral Oral  Resp:   18 18  Height:      Weight:    104.2 kg (229 lb 11.5 oz)  SpO2: 95% 96% 95% 97%   General:  No distress Lungs:  Clear Heart:  RRR no murmurs Abdomen:  Positive bowel sounds, no rebound no guarding Extremities:  No edema  LABS: No results found for this basename: CKTOTAL, CKMB, CKMBINDEX, TROPONINI   Results for orders placed during the hospital encounter of 05/17/11 (from the past 24 hour(s))  OCCULT BLOOD X 1 CARD TO LAB, STOOL     Status: Normal   Collection Time   05/19/11  4:52 PM      Component Value Range   Fecal Occult Bld NEGATIVE    BASIC METABOLIC PANEL     Status: Abnormal   Collection Time   05/20/11  5:56 AM      Component Value Range   Sodium 138  135 - 145 (mEq/L)   Potassium 4.1  3.5 - 5.1 (mEq/L)   Chloride 100  96 - 112 (mEq/L)   CO2 25  19 - 32 (mEq/L)   Glucose, Bld 116 (*) 70 - 99 (mg/dL)   BUN 23  6 - 23 (mg/dL)   Creatinine, Ser 1.10  0.50 - 1.35 (mg/dL)   Calcium 9.7  8.4 - 10.5 (mg/dL)   GFR calc non Af Amer 72 (*) >90 (mL/min)   GFR calc Af Amer 83 (*) >90 (mL/min)  CBC     Status: Abnormal   Collection Time   05/20/11  5:56 AM      Component Value Range   WBC 15.4 (*) 4.0 - 10.5 (K/uL)   RBC 4.83  4.22 - 5.81 (MIL/uL)   Hemoglobin 13.9  13.0 - 17.0 (g/dL)   HCT 42.6  39.0 - 52.0 (%)   MCV 88.2  78.0 - 100.0 (fL)   MCH 28.8  26.0 - 34.0 (pg)   MCHC 32.6  30.0 - 36.0 (g/dL)   RDW 16.3 (*) 11.5 - 15.5 (%)   Platelets 334  150 - 400 (K/uL)    Intake/Output Summary (Last 24 hours) at 05/20/11 0759 Last data filed at 05/20/11 0410  Gross per 24 hour  Intake   1152 ml  Output   1250 ml  Net    -98 ml    ASSESSMENT AND PLAN:  Principal Problem:  *Respiratory distress, acute:  Improved.  Plan per  primary team Active Problems:  Hypertension:  BP being treated in the context of treating the pulmonary edema   COPD (chronic obstructive pulmonary disease):  Per primary team  CHF, acute:  Plan out patient stress test.  Please let us know when the patient is discharged.  He will need a [employer].  (You can send me a flag through the inbox and I will arrange follow up.)  Pneumonia    Minus Breeding 05/20/2011 7:59 AM

## 2011-05-20 NOTE — Progress Notes (Signed)
I have read and agree with  Rosette Reveal ( 4th year Medical Student) note.   Please see further details in the d/c summary.   Rosalia Hammers, MD

## 2011-05-20 NOTE — Progress Notes (Signed)
Internal Medicine MS4 Progress Note  Subjective: Patient seen and examined bedside this morning. Patient said that he slept very well and had no acute adverse events overnight. He continues to endorse improved breathing. He has no current complaints and says that he is "feeling well". [facility]ology members were consulted and spoke to patient yesterday.  Objective: Vital signs in last 24 hours: Filed Vitals:   05/19/11 1405 05/19/11 2011 05/19/11 2058 05/20/11 0538  BP:   98/63 142/90  Pulse:   101 78  Temp:   97.6 F (36.4 C) 97.8 F (36.6 C)  TempSrc:   Oral Oral  Resp:   18 18  Height:      Weight:    104.2 kg (229 lb 11.5 oz)  SpO2: 95% 96% 95% 97%   Weight change: 1.687 kg (3 lb 11.5 oz)  Intake/Output Summary (Last 24 hours) at 05/20/11 0853 Last data filed at 05/20/11 0844  Gross per 24 hour  Intake    912 ml  Output   1250 ml  Net   -338 ml   Physical Exam: General appearance: AAOx3, resting upright bedside, cooperative and in NAD  HEENT: [place]/AT, EOMI, nasal canula in place Lungs: Bilateral equal air entry, mildly decreased breath sounds, improved lung exam  Heart: +S1,S2, RRR, no murmurs, rubs, gallops Abdomen: nontender, nondistended, mildly distended with +BSx4  Extremities: extremities normal, atraumatic, no cyanosis, no LE edema  Pulses: 2+ and symmetric  Skin: Skin color, texture, turgor normal. No rashes or lesions  Neurologic: Grossly normal  Lab Results: Basic Metabolic Panel:  Lab [id number] 05/19/11 0607  NA 138 142  K 4.1 4.1  CL 100 102  CO2 25 27  GLUCOSE 116* 87  BUN 23 25*  CREATININE 1.10 1.27  CALCIUM 9.7 9.9  MG -- --  PHOS -- --   Liver Function Tests:  Lab 05/17/11 0810  AST 17  ALT 20  ALKPHOS 68  BILITOT 0.9  PROT 7.7  ALBUMIN 3.3*   CBC:  Lab 05/20/11 0556 05/19/11 0607 05/17/11 0810  WBC 15.4* 17.8* --  NEUTROABS -- -- 11.8*  HGB 13.9 14.5 --  HCT 42.6 45.5 --  MCV 88.2 88.0 --  PLT 334 375 --    BNP:  Lab 05/17/11 0810  POCBNP 1672.0*   Hemoglobin A1C:  Lab 05/17/11 1200  HGBA1C 5.9*   Thyroid Function Tests:  Lab 05/17/11 1200  TSH 0.264*  T4TOTAL --  FREET4 --  T3FREE --  THYROIDAB --    Micro Results: Recent Results (from the past 240 hour(s))  CULTURE, BLOOD (ROUTINE X 2)     Status: Normal (Preliminary result)   Collection Time   05/17/11  8:10 AM      Component Value Range Status Comment   Specimen Description BLOOD LEFT ARM   Final    Special Requests BOTTLES DRAWN AEROBIC ONLY 10CC   Final    Setup Time [id number]   Final    Culture     Final    Value:        BLOOD CULTURE RECEIVED NO GROWTH TO DATE CULTURE WILL BE HELD FOR 5 DAYS BEFORE ISSUING A FINAL NEGATIVE REPORT   Report Status PENDING   Incomplete   CULTURE, BLOOD (ROUTINE X 2)     Status: Normal (Preliminary result)   Collection Time   05/17/11 12:00 PM      Component Value Range Status Comment   Specimen Description BLOOD LEFT HAND   Final  Special Requests BOTTLES DRAWN AEROBIC AND ANAEROBIC 10CC   Final    Setup Time [id number]   Final    Culture     Final    Value:        BLOOD CULTURE RECEIVED NO GROWTH TO DATE CULTURE WILL BE HELD FOR 5 DAYS BEFORE ISSUING A FINAL NEGATIVE REPORT   Report Status PENDING   Incomplete   CULTURE, SPUTUM-ASSESSMENT     Status: Normal   Collection Time   05/17/11  1:32 PM      Component Value Range Status Comment   Specimen Description SPUTUM   Final    Special Requests NONE   Final    Sputum evaluation     Final    Value: THIS SPECIMEN IS ACCEPTABLE. RESPIRATORY CULTURE REPORT TO FOLLOW.   Report Status 05/17/2011 FINAL   Final   CULTURE, RESPIRATORY     Status: Normal   Collection Time   05/17/11  1:32 PM      Component Value Range Status Comment   Specimen Description SPUTUM   Final    Special Requests NONE   Final    Gram Stain     Final    Value: FEW WBC PRESENT,BOTH PMN AND MONONUCLEAR     FEW SQUAMOUS EPITHELIAL CELLS PRESENT      MODERATE GRAM POSITIVE COCCI IN PAIRS     FEW GRAM POSITIVE RODS   Culture NORMAL OROPHARYNGEAL FLORA   Final    Report Status 05/19/2011 FINAL   Final   MRSA PCR SCREENING     Status: Normal   Collection Time   05/17/11  1:48 PM      Component Value Range Status Comment   MRSA by PCR NEGATIVE  NEGATIVE  Final    Studies/Results: No results found. Medications: I have reviewed the patient's current medications. Scheduled Meds:     allopurinol  100 mg Oral Daily   antiseptic oral rinse  15 mL Mouth Rinse BID   bisacodyl  5 mg Oral Daily   furosemide  20 mg Oral Daily   levofloxacin  750 mg Oral Daily   lisinopril  10 mg Oral Daily   metoprolol tartrate  12.5 mg Oral BID   mineral oil  1 enema Rectal Once   pantoprazole  40 mg Oral QHS   polyethylene glycol  17 g Oral Daily   predniSONE  40 mg Oral QAC breakfast   senna-docusate  1 tablet Oral Daily   tiotropium  18 mcg Inhalation Daily   DISCONTD: heparin  5,000 Units Subcutaneous Q8H   DISCONTD: ipratropium  0.5 mg Nebulization TID   DISCONTD: levalbuterol  0.63 mg Nebulization TID   DISCONTD: levofloxacin (LEVAQUIN) IV  750 mg Intravenous Q24H   Continuous Infusions:  PRN Meds:.acetaminophen, acetaminophen, labetalol, levalbuterol, morphine, sodium chloride, DISCONTD: senna-docusate  Assessment & Plan by Problem:   #SOB - resolving vs resolved  Multifactorial including COPD exacerbation, CHF exacerbation considering lower extremity edema, elevated BNP values, basilar rales, and pulmonary edema on CXR as well as possible CAP since patient report of productive cough with bloody and yellow sputum, fevers and chills. Patient has responded well to diuresis, has been afebrile this admission, and CXR performed 11/26 showed improvement from admission in line with symptomatic improvement in his SOB and productive cough. Patient says he is near/at his baseline at home. 2D echo performed shows EF 45-50%, PCP has no prior  studies.  - Duonebs q4hrs  - Xopenex prn  - Prednisone  taper starting dose with 40 mg  - transition to Levoquin PO  - Blood culture no growth x 3 days, sputum culture final report shows normal oropharyngeal flora   #CHF  Patient has no listed prior history of heart failure and no previous studies on record here or at his PCP in [place], [place]. On initial presentation patient had lower extremity swelling and an elevated BNP so CHF workup was pursued. A 2D Echo performed 11/26 read EF 45-50% and grade 3 diastolic dysfunction. Patient had already been diuresed with lasix and responded well symptomatically and had a creatinine bump so lasix was d/c'd.  -Cardiology consulted, greatly appreciated. Recommends to continue ACE-I, small lasix dose upon d/c, and K+ suppl. Consider starting Beta 1 selective beta-blocker as it has been shown to decrease mortality of CHF patients with COPD. Most likely stress perfusion study as outpatient  #HTN - stable  Blood pressure elevated in the ED. Patient was started initially on Nitro drop which was subsequently d/c since blood pressure was stable but elevated in the 140-150/88-100 range. A 2d echo was performed with results mentioned above. Patient had history of HTN but was not on any home medication.  - Continue Labetalol 5 mg IV q 6hrs prn for SBP >180 and DBP >100  - Continue ACE (lisinopril 10mg ) - Start metoprolol 12.5mg  as per cardio consult and studies showing improved mortality  #Gout  Chronic condition, will d/c indomethacin. Patient has no current complaints  -continue home med Allopurinol   # DVTppx: Heparin   LOS: 3 days   Rosette Reveal 05/20/2011, 8:53 AM

## 2011-05-23 LAB — CULTURE, BLOOD (ROUTINE X 2): Culture  Setup Time: 201211251644

## 2012-01-01 ENCOUNTER — Other Ambulatory Visit (HOSPITAL_COMMUNITY): Payer: Self-pay | Admitting: Internal Medicine

## 2012-01-01 NOTE — Telephone Encounter (Signed)
I got distracted and refill this even though you clearly stated pt was not [facility] pt. Would you pls call pharmacy and decline? Sorry for the mistake. THanks

## 2012-01-01 NOTE — Telephone Encounter (Signed)
i have called and ask for the refills to be denied and sent to the correct pcp

## 2012-12-26 DIAGNOSIS — R29898 Other symptoms and signs involving the musculoskeletal system: Secondary | ICD-10-CM | POA: Insufficient documentation

## 2012-12-27 DIAGNOSIS — I509 Heart failure, unspecified: Secondary | ICD-10-CM | POA: Insufficient documentation

## 2012-12-30 DIAGNOSIS — R799 Abnormal finding of blood chemistry, unspecified: Secondary | ICD-10-CM | POA: Insufficient documentation

## 2012-12-30 DIAGNOSIS — R945 Abnormal results of liver function studies: Secondary | ICD-10-CM | POA: Insufficient documentation

## 2012-12-30 DIAGNOSIS — J690 Pneumonitis due to inhalation of food and vomit: Secondary | ICD-10-CM | POA: Insufficient documentation

## 2013-04-22 ENCOUNTER — Emergency Department (HOSPITAL_COMMUNITY): Payer: Medicaid Other

## 2013-04-22 ENCOUNTER — Inpatient Hospital Stay (HOSPITAL_COMMUNITY)
Admission: EM | Admit: 2013-04-22 | Discharge: 2013-05-11 | DRG: 233 | Disposition: A | Discharge status: Home / Self Care | Payer: Medicaid Other | Attending: Thoracic Surgery (Cardiothoracic Vascular Surgery) | Admitting: Thoracic Surgery (Cardiothoracic Vascular Surgery)

## 2013-04-22 ENCOUNTER — Encounter (HOSPITAL_COMMUNITY): Payer: Self-pay | Admitting: Emergency Medicine

## 2013-04-22 DIAGNOSIS — K045 Chronic apical periodontitis: Secondary | ICD-10-CM | POA: Diagnosis present

## 2013-04-22 DIAGNOSIS — E860 Dehydration: Secondary | ICD-10-CM

## 2013-04-22 DIAGNOSIS — K089 Disorder of teeth and supporting structures, unspecified: Secondary | ICD-10-CM

## 2013-04-22 DIAGNOSIS — I509 Heart failure, unspecified: Secondary | ICD-10-CM

## 2013-04-22 DIAGNOSIS — K08109 Complete loss of teeth, unspecified cause, unspecified class: Secondary | ICD-10-CM

## 2013-04-22 DIAGNOSIS — R188 Other ascites: Secondary | ICD-10-CM

## 2013-04-22 DIAGNOSIS — E785 Hyperlipidemia, unspecified: Secondary | ICD-10-CM | POA: Diagnosis present

## 2013-04-22 DIAGNOSIS — Z7982 Long term (current) use of aspirin: Secondary | ICD-10-CM

## 2013-04-22 DIAGNOSIS — K029 Dental caries, unspecified: Secondary | ICD-10-CM | POA: Diagnosis present

## 2013-04-22 DIAGNOSIS — I10 Essential (primary) hypertension: Secondary | ICD-10-CM

## 2013-04-22 DIAGNOSIS — R748 Abnormal levels of other serum enzymes: Secondary | ICD-10-CM

## 2013-04-22 DIAGNOSIS — E872 Acidosis, unspecified: Secondary | ICD-10-CM | POA: Diagnosis present

## 2013-04-22 DIAGNOSIS — Z79899 Other long term (current) drug therapy: Secondary | ICD-10-CM

## 2013-04-22 DIAGNOSIS — K1370 Unspecified lesions of oral mucosa: Secondary | ICD-10-CM | POA: Diagnosis present

## 2013-04-22 DIAGNOSIS — I959 Hypotension, unspecified: Secondary | ICD-10-CM | POA: Diagnosis not present

## 2013-04-22 DIAGNOSIS — J189 Pneumonia, unspecified organism: Secondary | ICD-10-CM

## 2013-04-22 DIAGNOSIS — J438 Other emphysema: Secondary | ICD-10-CM | POA: Diagnosis present

## 2013-04-22 DIAGNOSIS — I5043 Acute on chronic combined systolic (congestive) and diastolic (congestive) heart failure: Principal | ICD-10-CM | POA: Diagnosis present

## 2013-04-22 DIAGNOSIS — E871 Hypo-osmolality and hyponatremia: Secondary | ICD-10-CM | POA: Diagnosis present

## 2013-04-22 DIAGNOSIS — Z8249 Family history of ischemic heart disease and other diseases of the circulatory system: Secondary | ICD-10-CM

## 2013-04-22 DIAGNOSIS — I498 Other specified cardiac arrhythmias: Secondary | ICD-10-CM | POA: Diagnosis not present

## 2013-04-22 DIAGNOSIS — I5021 Acute systolic (congestive) heart failure: Secondary | ICD-10-CM | POA: Diagnosis present

## 2013-04-22 DIAGNOSIS — E876 Hypokalemia: Secondary | ICD-10-CM | POA: Diagnosis not present

## 2013-04-22 DIAGNOSIS — L97509 Non-pressure chronic ulcer of other part of unspecified foot with unspecified severity: Secondary | ICD-10-CM | POA: Diagnosis present

## 2013-04-22 DIAGNOSIS — I079 Rheumatic tricuspid valve disease, unspecified: Secondary | ICD-10-CM | POA: Diagnosis present

## 2013-04-22 DIAGNOSIS — K5909 Other constipation: Secondary | ICD-10-CM

## 2013-04-22 DIAGNOSIS — R319 Hematuria, unspecified: Secondary | ICD-10-CM | POA: Diagnosis present

## 2013-04-22 DIAGNOSIS — Z87891 Personal history of nicotine dependence: Secondary | ICD-10-CM

## 2013-04-22 DIAGNOSIS — I129 Hypertensive chronic kidney disease with stage 1 through stage 4 chronic kidney disease, or unspecified chronic kidney disease: Secondary | ICD-10-CM | POA: Diagnosis present

## 2013-04-22 DIAGNOSIS — Z9089 Acquired absence of other organs: Secondary | ICD-10-CM

## 2013-04-22 DIAGNOSIS — K08409 Partial loss of teeth, unspecified cause, unspecified class: Secondary | ICD-10-CM | POA: Diagnosis present

## 2013-04-22 DIAGNOSIS — M109 Gout, unspecified: Secondary | ICD-10-CM | POA: Diagnosis not present

## 2013-04-22 DIAGNOSIS — I6529 Occlusion and stenosis of unspecified carotid artery: Secondary | ICD-10-CM | POA: Diagnosis present

## 2013-04-22 DIAGNOSIS — N179 Acute kidney failure, unspecified: Secondary | ICD-10-CM | POA: Diagnosis not present

## 2013-04-22 DIAGNOSIS — K044 Acute apical periodontitis of pulpal origin: Secondary | ICD-10-CM | POA: Diagnosis present

## 2013-04-22 DIAGNOSIS — E1169 Type 2 diabetes mellitus with other specified complication: Secondary | ICD-10-CM | POA: Diagnosis present

## 2013-04-22 DIAGNOSIS — L97519 Non-pressure chronic ulcer of other part of right foot with unspecified severity: Secondary | ICD-10-CM | POA: Diagnosis present

## 2013-04-22 DIAGNOSIS — J449 Chronic obstructive pulmonary disease, unspecified: Secondary | ICD-10-CM

## 2013-04-22 DIAGNOSIS — Z880 Allergy status to penicillin: Secondary | ICD-10-CM

## 2013-04-22 DIAGNOSIS — D62 Acute posthemorrhagic anemia: Secondary | ICD-10-CM | POA: Diagnosis not present

## 2013-04-22 DIAGNOSIS — I2589 Other forms of chronic ischemic heart disease: Secondary | ICD-10-CM | POA: Diagnosis present

## 2013-04-22 DIAGNOSIS — K7200 Acute and subacute hepatic failure without coma: Secondary | ICD-10-CM | POA: Diagnosis present

## 2013-04-22 DIAGNOSIS — I2582 Chronic total occlusion of coronary artery: Secondary | ICD-10-CM | POA: Diagnosis present

## 2013-04-22 DIAGNOSIS — K769 Liver disease, unspecified: Secondary | ICD-10-CM | POA: Diagnosis present

## 2013-04-22 DIAGNOSIS — N189 Chronic kidney disease, unspecified: Secondary | ICD-10-CM | POA: Diagnosis present

## 2013-04-22 DIAGNOSIS — I214 Non-ST elevation (NSTEMI) myocardial infarction: Secondary | ICD-10-CM | POA: Diagnosis not present

## 2013-04-22 DIAGNOSIS — H6120 Impacted cerumen, unspecified ear: Secondary | ICD-10-CM | POA: Diagnosis present

## 2013-04-22 DIAGNOSIS — R791 Abnormal coagulation profile: Secondary | ICD-10-CM | POA: Diagnosis present

## 2013-04-22 DIAGNOSIS — Z951 Presence of aortocoronary bypass graft: Secondary | ICD-10-CM

## 2013-04-22 DIAGNOSIS — R0603 Acute respiratory distress: Secondary | ICD-10-CM

## 2013-04-22 DIAGNOSIS — Z833 Family history of diabetes mellitus: Secondary | ICD-10-CM

## 2013-04-22 DIAGNOSIS — K5900 Constipation, unspecified: Secondary | ICD-10-CM | POA: Diagnosis present

## 2013-04-22 DIAGNOSIS — I2510 Atherosclerotic heart disease of native coronary artery without angina pectoris: Secondary | ICD-10-CM | POA: Diagnosis present

## 2013-04-22 HISTORY — DX: Type 2 diabetes mellitus without complications: E11.9

## 2013-04-22 LAB — URINALYSIS, ROUTINE W REFLEX MICROSCOPIC
Ketones, ur: 15 mg/dL — AB
Nitrite: NEGATIVE
Urobilinogen, UA: 4 mg/dL — ABNORMAL HIGH (ref 0.0–1.0)

## 2013-04-22 LAB — CBC WITH DIFFERENTIAL/PLATELET
Basophils Absolute: 0 10*3/uL (ref 0.0–0.1)
Eosinophils Absolute: 0 10*3/uL (ref 0.0–0.7)
Eosinophils Relative: 0 % (ref 0–5)
HCT: 35.4 % — ABNORMAL LOW (ref 39.0–52.0)
Hemoglobin: 11.3 g/dL — ABNORMAL LOW (ref 13.0–17.0)
Lymphs Abs: 1 10*3/uL (ref 0.7–4.0)
MCH: 25.6 pg — ABNORMAL LOW (ref 26.0–34.0)
MCV: 80.3 fL (ref 78.0–100.0)
Monocytes Absolute: 1.4 10*3/uL — ABNORMAL HIGH (ref 0.1–1.0)
Monocytes Relative: 12 % (ref 3–12)
Neutrophils Relative %: 78 % — ABNORMAL HIGH (ref 43–77)
Platelets: 237 10*3/uL (ref 150–400)
RBC: 4.41 MIL/uL (ref 4.22–5.81)

## 2013-04-22 LAB — COMPREHENSIVE METABOLIC PANEL
Alkaline Phosphatase: 76 U/L (ref 39–117)
BUN: 29 mg/dL — ABNORMAL HIGH (ref 6–23)
Chloride: 98 mEq/L (ref 96–112)
Creatinine, Ser: 1.59 mg/dL — ABNORMAL HIGH (ref 0.50–1.35)
GFR calc Af Amer: 52 mL/min — ABNORMAL LOW (ref >90)
Glucose, Bld: 116 mg/dL — ABNORMAL HIGH (ref 70–99)
Potassium: 4.8 mEq/L (ref 3.5–5.1)
Total Bilirubin: 2.8 mg/dL — ABNORMAL HIGH (ref 0.3–1.2)
Total Protein: 6.9 g/dL (ref 6.0–8.3)

## 2013-04-22 LAB — URINE MICROSCOPIC-ADD ON

## 2013-04-22 LAB — PRO B NATRIURETIC PEPTIDE: Pro B Natriuretic peptide (BNP): 2616 pg/mL — ABNORMAL HIGH (ref 0–125)

## 2013-04-22 LAB — LIPASE, BLOOD: Lipase: 19 U/L (ref 11–59)

## 2013-04-22 LAB — CG4 I-STAT (LACTIC ACID)
Lactic Acid, Venous: 5.86 mmol/L — ABNORMAL HIGH (ref 0.5–2.2)
Lactic Acid, Venous: 5.63 mmol/L — ABNORMAL HIGH (ref 0.5–2.2)

## 2013-04-22 MED ORDER — ASPIRIN EC 81 MG PO TBEC
81.0000 mg | DELAYED_RELEASE_TABLET | Freq: Every day | ORAL | Status: DC
  Administered 2013-04-23 – 2013-05-04 (×11): 81 mg via ORAL
  Filled 2013-04-22 (×12): qty 1

## 2013-04-22 MED ORDER — IOHEXOL 300 MG/ML  SOLN
50.0000 mL | Freq: Once | INTRAMUSCULAR | Status: AC | PRN
  Administered 2013-04-22: 50 mL via ORAL

## 2013-04-22 MED ORDER — NICOTINE 7 MG/24HR TD PT24: 7.0000 mg | MEDICATED_PATCH | Freq: Every day | TRANSDERMAL | Status: DC

## 2013-04-22 MED ORDER — RANOLAZINE ER 500 MG PO TB12
1000.0000 mg | ORAL_TABLET | Freq: Two times a day (BID) | ORAL | Status: DC
  Administered 2013-04-22 – 2013-04-24 (×5): 1000 mg via ORAL
  Filled 2013-04-22 (×7): qty 2

## 2013-04-22 MED ORDER — SODIUM CHLORIDE 0.9 % IJ SOLN
3.0000 mL | Freq: Two times a day (BID) | INTRAMUSCULAR | Status: DC
  Administered 2013-04-23 – 2013-05-04 (×18): 3 mL via INTRAVENOUS

## 2013-04-22 MED ORDER — FUROSEMIDE 40 MG PO TABS
40.0000 mg | ORAL_TABLET | Freq: Every day | ORAL | Status: DC
  Administered 2013-04-23: 40 mg via ORAL
  Filled 2013-04-22: qty 1

## 2013-04-22 MED ORDER — FUROSEMIDE 10 MG/ML IJ SOLN
20.0000 mg | Freq: Once | INTRAMUSCULAR | Status: DC
  Filled 2013-04-22: qty 2

## 2013-04-22 MED ORDER — TIOTROPIUM BROMIDE MONOHYDRATE 18 MCG IN CAPS
18.0000 ug | ORAL_CAPSULE | Freq: Every day | RESPIRATORY_TRACT | Status: DC
  Administered 2013-04-23 – 2013-05-10 (×15): 18 ug via RESPIRATORY_TRACT
  Filled 2013-04-22 (×4): qty 5

## 2013-04-22 MED ORDER — LEVOFLOXACIN IN D5W 750 MG/150ML IV SOLN
750.0000 mg | Freq: Once | INTRAVENOUS | Status: AC
  Administered 2013-04-22: 750 mg via INTRAVENOUS
  Filled 2013-04-22: qty 150

## 2013-04-22 MED ORDER — HEPARIN SODIUM (PORCINE) 5000 UNIT/ML IJ SOLN
5000.0000 [IU] | Freq: Three times a day (TID) | INTRAMUSCULAR | Status: DC
  Administered 2013-04-22 – 2013-04-23 (×2): 5000 [IU] via SUBCUTANEOUS
  Filled 2013-04-22 (×5): qty 1

## 2013-04-22 MED ORDER — METOPROLOL SUCCINATE ER 25 MG PO TB24
25.0000 mg | ORAL_TABLET | Freq: Every day | ORAL | Status: DC
  Administered 2013-04-23 – 2013-04-26 (×4): 25 mg via ORAL
  Filled 2013-04-22 (×5): qty 1

## 2013-04-22 MED ORDER — ALLOPURINOL 300 MG PO TABS
300.0000 mg | ORAL_TABLET | Freq: Every day | ORAL | Status: DC
  Filled 2013-04-22: qty 1

## 2013-04-22 MED ORDER — ONDANSETRON HCL 4 MG PO TABS: 4.0000 mg | ORAL_TABLET | Freq: Four times a day (QID) | ORAL | Status: DC | PRN

## 2013-04-22 MED ORDER — POLYETHYLENE GLYCOL 3350 17 G PO PACK
17.0000 g | PACK | Freq: Every day | ORAL | Status: DC | PRN
  Filled 2013-04-22: qty 1

## 2013-04-22 MED ORDER — PANTOPRAZOLE SODIUM 40 MG PO TBEC
40.0000 mg | DELAYED_RELEASE_TABLET | Freq: Every day | ORAL | Status: DC
  Administered 2013-04-23 – 2013-05-04 (×11): 40 mg via ORAL
  Filled 2013-04-22 (×12): qty 1

## 2013-04-22 MED ORDER — ONDANSETRON HCL 4 MG/2ML IJ SOLN: 4.0000 mg | Freq: Four times a day (QID) | INTRAMUSCULAR | Status: DC | PRN

## 2013-04-22 MED ORDER — ALBUTEROL SULFATE HFA 108 (90 BASE) MCG/ACT IN AERS
2.0000 | INHALATION_SPRAY | RESPIRATORY_TRACT | Status: DC | PRN
Start: 2013-04-22 — End: 2013-05-11
  Filled 2013-04-22: qty 6.7

## 2013-04-22 MED ORDER — SODIUM CHLORIDE 0.9 % IV SOLN
INTRAVENOUS | Status: DC
  Administered 2013-04-22 – 2013-04-23 (×2): via INTRAVENOUS

## 2013-04-22 MED ORDER — NICOTINE 7 MG/24HR TD PT24
7.0000 mg | MEDICATED_PATCH | Freq: Every day | TRANSDERMAL | Status: DC
  Administered 2013-04-22 – 2013-05-04 (×12): 7 mg via TRANSDERMAL
  Filled 2013-04-22 (×14): qty 1

## 2013-04-22 NOTE — ED Provider Notes (Signed)
CSN: [id number]     Arrival date & time 04/22/13  1312 History   First MD Initiated Contact with Patient 04/22/13 1327     Chief Complaint  Patient presents with  . Abdominal Pain   (Consider location/radiation/quality/duration/timing/severity/associated sxs/prior Treatment) The history is provided by the patient. No language interpreter was used.  [name] [name] is a 62 y/o M with PMHx of COPD, gout, HTN, HLD, presenting to the ED with abdominal pain that has been ongoing for the past 2 days. Patient reported that the abdominal pain is localized to the lower abdomen, described as a bloated, distended sensation - reported that he feels pressure in his abdomen. Patient reported that walking and motion makes the discomfort worse. Reported that he has two episodes of emesis yesterday - NB/NB. Patient reported that he weighed himself yesterday and was 207 lbs and reported that when he weighed himself today he was 215 lbs. Stated that he is on lasix - reported that he takes his medication today. Patient reported that he has shortness of breath with motion. Stated that he had blood in his urine today. Denied chest pain, nausea, diarrhea, melena, hematochezia, headache, dizziness.  PCP Dr. Tobie Lords   Past Medical History  Diagnosis Date  . COPD (chronic obstructive pulmonary disease)   . Gout   . Emphysema   . Hypertension   . Hyperlipidemia   . Tobacco abuse     30+ pack-year history   Past Surgical History  Procedure Laterality Date  . Appendectomy  as a child   History reviewed. No pertinent family history. History  Substance Use Topics  . Smoking status: Former Smoker -- 1.00 packs/day for 30 years    Quit date: 05/11/2011  . Smokeless tobacco: Not on file  . Alcohol Use: No    Review of Systems  Constitutional: Negative for fever and chills.  Respiratory: Positive for shortness of breath. Negative for chest tightness.   Cardiovascular: Negative for chest pain.  Gastrointestinal:  Positive for vomiting and abdominal pain. Negative for nausea, diarrhea, constipation, blood in stool and anal bleeding.  Neurological: Positive for light-headedness. Negative for dizziness and weakness.  All other systems reviewed and are negative.    Allergies  Penicillins  Home Medications   Current Outpatient Rx  Name  Route  Sig  Dispense  Refill  . albuterol (PROVENTIL HFA;VENTOLIN HFA) 108 (90 BASE) MCG/ACT inhaler   Inhalation   Inhale 2 puffs into the lungs every 4 (four) hours as needed. For shortness of breath          . allopurinol (ZYLOPRIM) 300 MG tablet   Oral   Take 300 mg by mouth daily.         Marland Kitchen aspirin EC 81 MG tablet   Oral   Take 81 mg by mouth daily.         Marland Kitchen b complex vitamins tablet   Oral   Take 1 tablet by mouth daily.         . carvedilol (COREG) 3.125 MG tablet   Oral   Take 3.125 mg by mouth 2 (two) times daily with a meal.         . colchicine 0.6 MG tablet   Oral   Take 0.6-1.2 mg by mouth daily. Take 2 tablets at the onset of gout symptoms, then take 1 tablet in an hour for a max of 3 tablets per 24 hours         . furosemide (LASIX) 40 MG tablet  Oral   Take 40 mg by mouth daily. Take 1-2 times daily         . lubiprostone (AMITIZA) 24 MCG capsule   Oral   Take 24 mcg by mouth 2 (two) times daily with a meal.         . metoprolol succinate (TOPROL-XL) 25 MG 24 hr tablet   Oral   Take 25 mg by mouth daily.         Marland Kitchen omeprazole (PRILOSEC) 20 MG capsule   Oral   Take 20 mg by mouth daily.         . potassium chloride SA (K-DUR,KLOR-CON) 20 MEQ tablet   Oral   Take 20 mEq by mouth 2 (two) times daily.         . ranolazine (RANEXA) 1000 MG SR tablet   Oral   Take 1,000 mg by mouth 2 (two) times daily.         Marland Kitchen tiotropium (SPIRIVA) 18 MCG inhalation capsule   Inhalation   Place 18 mcg into inhaler and inhale daily.         Marland Kitchen EXPIRED: lisinopril (PRINIVIL,ZESTRIL) 10 MG tablet   Oral   Take 1  tablet (10 mg total) by mouth daily.   30 tablet   0    BP 116/82  Pulse 77  Temp(Src) 98.5 F (36.9 C) (Oral)  Resp 20  Ht 5\' 7"  (1.702 m)  Wt 215 lb (97.523 kg)  BMI 33.67 kg/m2  SpO2 99% Physical Exam  Nursing note and vitals reviewed. Constitutional: He is oriented to person, place, and time. He appears well-developed and well-nourished. No distress.  HENT:  Head: Normocephalic and atraumatic.  Neck: Normal range of motion. Neck supple.  Cardiovascular: Normal rate, regular rhythm and normal heart sounds.  Exam reveals no friction rub.   No murmur heard. Pulmonary/Chest: Effort normal. He has wheezes. He has no rales.  Decreased breath sounds to upper and lower lobes bilaterally   Abdominal: Soft. Bowel sounds are normal. He exhibits shifting dullness, distension and ascites. There is no tenderness. There is no guarding.  Distended abdomen Positive fluid wave Positive shifting dullness   Musculoskeletal: Normal range of motion.  Neurological: He is alert and oriented to person, place, and time.  Skin: Skin is warm and dry. No rash noted. He is not diaphoretic. No erythema.  Psychiatric: He has a normal mood and affect. Thought content normal.    ED Course  Procedures (including critical care time)  3:28 PMThis provider was notified that the blood pressure of the patient was dropping to 103/77 - lasix held and not administered. Due to drop in blood pressure, concerns regarding possible dissection - patient denied back pain or abdominal pain. Dr. Alyson Locket at bedside performed ultrasound with limited findings to the liver and spleen. Recommended patient to get CT abdomen to be performed. CT abdomen and pelvis without contrast ordered.   5:22 PM This provider spoke with the patient - reported that he used to drink alcohol. Patient reported that he drank liquor and beer, mainly liquor reported that he would drink 3-4 ounces per day. Reported that he has not drank alcohol in over  a year.   5:27 PM This provider and attending physician reviewing labs and imaging of patient. Unremarkable [place] noted - CT abdomen and pelvis ordered without contrast - rule out pancreatic mass. Hepatitis panel ordered to rule out hepatitis since AST and ALT have increased in an acute manner. Lactic acid and  blood cultures ordered. Patient started on IV fluids secondary to drop in pressures. IV lasix discontinued. Blood cultures obtained since patient has infiltrates in right lower lobe - started patient on CAP antibiotics via IV.    8:57 PM This provider spoke with Dr. Chrisandra Netters from [place] - discussed history, case, labs and imaging results. Family Medicine to come and see patient for admission.   9:05 PM This provider discussed with the patient lab and imaging results. Discussed with patient plan for admission - patient agreed and understood.   Labs Review Labs Reviewed  CBC WITH DIFFERENTIAL - Abnormal; Notable for the following:    WBC 11.0 (*)    Hemoglobin 11.3 (*)    HCT 35.4 (*)    MCH 25.6 (*)    RDW 21.3 (*)    Neutrophils Relative % 78 (*)    Neutro Abs 8.6 (*)    Lymphocytes Relative 9 (*)    Monocytes Absolute 1.4 (*)    All other components within normal limits  COMPREHENSIVE METABOLIC PANEL - Abnormal; Notable for the following:    Sodium 131 (*)    CO2 16 (*)    Glucose, Bld 116 (*)    BUN 29 (*)    Creatinine, Ser 1.59 (*)    Albumin 3.1 (*)    AST 819 (*)    ALT 597 (*)    Total Bilirubin 2.8 (*)    GFR calc non Af Amer 45 (*)    GFR calc Af Amer 52 (*)    All other components within normal limits  PRO B NATRIURETIC PEPTIDE - Abnormal; Notable for the following:    Pro B Natriuretic peptide (BNP) 2616.0 (*)    All other components within normal limits  URINALYSIS, ROUTINE W REFLEX MICROSCOPIC - Abnormal; Notable for the following:    Color, Urine ORANGE (*)    APPearance CLOUDY (*)    Bilirubin Urine MODERATE (*)     Ketones, ur 15 (*)    Protein, ur 30 (*)    Urobilinogen, UA 4.0 (*)    Leukocytes, UA TRACE (*)    All other components within normal limits  URINE MICROSCOPIC-ADD ON - Abnormal; Notable for the following:    Casts HYALINE CASTS (*)    All other components within normal limits  CG4 I-STAT (LACTIC ACID) - Abnormal; Notable for the following:    Lactic Acid, Venous 5.86 (*)    All other components within normal limits  CULTURE, BLOOD (ROUTINE X 2)  CULTURE, BLOOD (ROUTINE X 2)  LIPASE, BLOOD  HEPATITIS PANEL, ACUTE  BLOOD GAS, ARTERIAL   Imaging Review Ct Abdomen Pelvis Wo Contrast  04/22/2013   CLINICAL DATA:  Abdominal pain and distention.  EXAM: CT ABDOMEN AND PELVIS WITHOUT CONTRAST  TECHNIQUE: Multidetector CT imaging of the abdomen and pelvis was performed following the standard protocol without intravenous contrast.  COMPARISON:  04/21/2013  FINDINGS: Mild ascites again seen within the abdomen and pelvis, without significant change. The liver, gallbladder, spleen, pancreas, adrenal glands, and kidneys are unremarkable in appearance except for small right renal cyst. Vicarious excretion of contrast noted in the gallbladder as well as mild residual contrast enhancement of both kidneys from the most recent CT. No soft tissue masses or lymphadenopathy identified within the abdomen or pelvis. Residual contrast seen in the urinary bladder.  Diverticulosis is seen mainly involving the sigmoid colon, however there is no evidence of diverticulitis. No focal inflammatory process or abscess identified. No  evidence of bowel obstruction or hernia.  IMPRESSION: Mild ascites, without significant change since prior exam.  Diverticulosis. No radiographic evidence of diverticulitis.   Electronically Signed   By: Earle Gell M.D.   On: 04/22/2013 20:39   Dg Chest 2 View  04/22/2013   CLINICAL DATA:  Chest pain and vomiting  EXAM: CHEST  2 VIEW  COMPARISON:  04/21/2013  FINDINGS: The heart is again enlarged  in size but stable. The lungs are well aerated bilaterally. Some very mild patchy changes are noted in the right upper lobe which may represent some early infiltrate.  IMPRESSION: Stable cardiomegaly.  New patchy changes in the right upper lobe which may represent an early infiltrate.   Electronically Signed   By: Inez Catalina M.D.   On: 04/22/2013 14:51   US Abdomen Complete  04/22/2013   CLINICAL DATA:  Abdominal pain.  EXAM: ULTRASOUND ABDOMEN COMPLETE  COMPARISON:  None.  FINDINGS: Gallbladder  No gallstones identified. Mild diffuse gallbladder wall thickening seen measuring 4 mm, however no sonographic Murphy sign was noted.  Common bile duct  Diameter: 3 mm  Liver  No focal lesion identified. Within normal limits in parenchymal echogenicity. Mild perihepatic ascites noted.  IVC  No abnormality visualized.  Pancreas  Visualized portion unremarkable.  Spleen  Size and appearance within normal limits.  Right Kidney  Length: 10.3 cm. Echogenicity within normal limits. No mass or hydronephrosis visualized.  Left Kidney  Length: 10.4 cm. Echogenicity within normal limits. No mass or hydronephrosis visualized.  Abdominal aorta  No aneurysm visualized.  IMPRESSION: No evidence of gallstones or biliary dilatation.  Mild diffuse gallbladder wall thickening, which is a nonspecific finding. Mild ascites also noted.  Unremarkable sonographic appearance of the liver.   Electronically Signed   By: Earle Gell M.D.   On: 04/22/2013 16:46    EKG Interpretation     Ventricular Rate:  80 PR Interval:  174 QRS Duration: 151 QT Interval:  452 QTC Calculation: 521 R Axis:   -57 Text Interpretation:  Sinus rhythm Probable left atrial enlargement Right bundle branch block            MDM   1. CHF exacerbation   2. Ascites   3. Community acquired pneumonia   4. Elevated liver enzymes   5. Dehydration     Patient presenting to the ED with abdominal pain that has been ongoing for the past 2 days. Patient  reported that he has a bloating sensation, feeling of fullness in the abdomen. Patient reported that he has noticed increase in distension of his abdomen, reported that he weighed himself yesterday and stated that he was 207 lbs and stated that when he weighed himself today he was 215 lbs.  Alert and oriented. Heart rate and rhythm normal. Pulses palpable and strong, radial and DP bilaterally. Rhonchi and decreased breath sounds localize to upper lower lobes bilaterally. Bowel sounds normal active, mildly decreased bowel sounds-dull. Distended abdomen. Negative pain upon palpation. Positive fluid wave. Shifting dullness noted upon exam. Negative leg and ankle swelling, negative pitting edema noted.  CBC noted elevation white blood cell count to be 11.0 with elevation and leukocytosis identified. Trend seen with anemia-hemoglobin 11.3, hematocrit 35.4 - decrease in hemoglobin and hematocrit noted when compared to levels approximate one year ago. CMP noted mild hyponatremia of 131, mild low chloride of 16. Patient currently in acute kidney disease, BUN elevated at 29 and creatinine elevated at 1.59. Elevation in liver enzymes-AST 819 and ALT  597. Negative elevation in alkaline phosphatase. Elevation in total bilirubin 2.8. BNP elevation of 2616. Chest x-ray noted stable cardiomegaly, in association to congestive heart failure-new past she changes in the right upper lobe and may represent early infiltrates identified, possible new onset pneumonia. CT abdomen noted ascites with negative acute abnormalities noted. Hepatitis panel and blood cultures pending. Ascites etiology unknown. This provider discussed case with Family Medicine Teaching services - patient to be admitted to the hospital for CHF exacerbation, ascites, CAP, elevated liver enzymes, dehydration. Discussed plan for admission with patient, patient understood and agreed to plan. Patient stable for transfer.    Jamse Mead, PA-C 04/23/13 1255

## 2013-04-22 NOTE — ED Notes (Signed)
Pt here for abd pain and distention and recently noticed blood in urine.

## 2013-04-22 NOTE — ED Notes (Signed)
MD at bedside. 

## 2013-04-22 NOTE — H&P (Signed)
[facility] Admission History and Physical Service Pager: [phone]  Patient name: [name] [name] Medical record number: [id number] Date of birth: [birth year] Age: 62 y.o. Gender: male  Primary Care Provider: Fae Pippin (unassigned patient) Dr. Heloise Purpura in [place] Consultants: none Code Status: discussed with patient upon admission, he desires to be full code  Chief Complaint: abdominal distention  Assessment and Plan: [name] is a 62 y.o. male presenting with abdominal discomfort and distention, found to have AKI and markedly elevated LFT's . PMH is significant for CHF, COPD, HTN, and tobacco abuse.  # Transaminitis with elevated bilirubin, ascites: - abd u/s and CT abdomen essentially unremarkable - acute hepatitis panel pending - trend CMET, will repeat LFT's in AM - hold home colchicine, amitiza (these are hepatically dosed)  # AKI, metabolic acidosis, elevated lactic acid: - gently hydrate overnight with NS @125  cc/hr - repeat creatinine and lactic acid in morning - since we have no labs for the last 2 years in our system, ? If this is truly AKI or if he has since established new baseline, will be helpful to call PCP's office on Monday for more background. I am unable to access his records from his reported recent hospitalization at [facility]. - hold home lisinopril (unclear if this is actually a medicine he takes though)  # SOB: PNA vs CHF exacerbation, CXR with RUL infiltrate - levaquin for HCAP (has been hospitalized in last several months) - lasix 40mg  daily (home med) - would be helpful to find out dry weight - strict I&O, daily weights - albuterol, spiriva, incentive spirometry - continue home toprol XL, hold home carvedilol (not sure why he is on two beta blockers), continue home ranolazine - monitor on telemetry - consider echo vs obtaining outside records  # Diabetes: pt reports he has DM, not on any medications per med list - check  A1c - sensitive SSI, CBGs qac & hs  # Reported hematuria: urine not grossly infected on UA - will send urine culture - monitor UOP  # Gout: hold colchicine, continue allopurinol  # Tobacco abuse: will provide nicotine patch per pt request  FEN/GI: heart healthy diet, NS @ 125 cc/hr Prophylaxis: heparin SQ  Disposition: admit to telemetry, pending improvement of AKI, SOB, & workup for elevated LFT's  History of Present Illness: [name] [name] is a 62 y.o. male presenting with a few days of abdominal pain and fullness. Patient states that a couple of days ago his bili started to feel distended. He has intermittently vomited and had decreased ability to keep food down. He had a bowel movement the morning the discomfort started, but has not had a bowel movement since. Denies having any fevers, blood in his stool, or blood in his vomit. He did have some blood in his urine today. He has noticed some swelling in his legs. Denies having any chest pain now or in the last few days. Has had some more shortness of breath than is normal for him over the last few days. Has also had a mild cough with some sputum production. Denies any history of having problems with his kidneys or liver in the past. He does state that a few months ago he was admitted to [facility]. He last saw his PCP a few weeks ago. His PCP is Dr. Heloise Purpura in [place]. He has been taking new medications lately, the care of their names. He does have a cardiologist but does not know the cardiologist's name.   Review  Of Systems: Per HPI, otherwise 12 point review of systems was performed and was unremarkable.  Patient Active Problem List   Diagnosis Date Noted  . Pneumonia 05/18/2011  . Gout 05/17/2011  . Respiratory distress, acute 05/17/2011  . Hypertension 05/17/2011  . COPD (chronic obstructive pulmonary disease) 05/17/2011  . CHF, acute 05/17/2011   Past Medical History: Past Medical History  Diagnosis Date  . COPD (chronic  obstructive pulmonary disease)   . Gout   . Emphysema   . Hypertension   . Hyperlipidemia   . Tobacco abuse     30+ pack-year history   Past Surgical History: Past Surgical History  Procedure Laterality Date  . Appendectomy  as a child   Social History: History  Substance Use Topics  . Smoking status: Former Smoker -- 1.00 packs/day for 30 years    Quit date: 05/11/2011  . Smokeless tobacco: Not on file  . Alcohol Use: No   states that he smokes half a pack per day, has smoked for 30 years.  States he quit drinking 3 years ago, he used to drink nightly after work. He would have 4 drinks every night. He quit drinking because he "got tired of it". He denies any current drug use. Also denies any history of IV drug use, states he is afraid of needles.   Please also refer to relevant sections of EMR.  Family History: Family History  Problem Relation Age of Onset  . Diabetes Mother   . Hypertension    . Heart attack      Brothers    Allergies and Medications: Allergies  Allergen Reactions  . Penicillins Other (See Comments)    Swelling , long time ago.    Medications:  albuterol 108 (90 BASE) MCG/ACT inhaler  Commonly known as:  PROVENTIL HFA;VENTOLIN HFA  Inhale 2 puffs into the lungs every 4 (four) hours as needed. For shortness of breath     allopurinol 300 MG tablet  Commonly known as:  ZYLOPRIM  Take 300 mg by mouth daily.     aspirin EC 81 MG tablet  Take 81 mg by mouth daily.     b complex vitamins tablet  Take 1 tablet by mouth daily.     carvedilol 3.125 MG tablet  Commonly known as:  COREG  Take 3.125 mg by mouth 2 (two) times daily with a meal.     colchicine 0.6 MG tablet  Take 0.6-1.2 mg by mouth daily. Take 2 tablets at the onset of gout symptoms, then take 1 tablet in an hour for a max of 3 tablets per 24 hours     furosemide 40 MG tablet  Commonly known as:  LASIX  Take 40 mg by mouth daily. Take 1-2 times daily     lisinopril 10 MG tablet   Commonly known as:  PRINIVIL,ZESTRIL  Take 1 tablet (10 mg total) by mouth daily.     lubiprostone 24 MCG capsule  Commonly known as:  AMITIZA  Take 24 mcg by mouth 2 (two) times daily with a meal.     metoprolol succinate 25 MG 24 hr tablet  Commonly known as:  TOPROL-XL  Take 25 mg by mouth daily.     omeprazole 20 MG capsule  Commonly known as:  PRILOSEC  Take 20 mg by mouth daily.     potassium chloride SA 20 MEQ tablet  Commonly known as:  K-DUR,KLOR-CON  Take 20 mEq by mouth 2 (two) times daily.  RANEXA 1000 MG SR tablet  Generic drug:  ranolazine  Take 1,000 mg by mouth 2 (two) times daily.     tiotropium 18 MCG inhalation capsule  Commonly known as:  SPIRIVA  Place 18 mcg into inhaler and inhale daily.       Objective: BP 116/82  Pulse 77  Temp(Src) 98.5 F (36.9 C) (Oral)  Resp 20  Ht 5\' 7"  (1.702 m)  Wt 215 lb (97.523 kg)  BMI 33.67 kg/m2  SpO2 99% Exam: General: no acute distress, pleasant and cooperative, laying in bed, appears somewhat weak  HEENT:  normocephalic, atraumatic, pupils equal but minimally reactive to light, no oral exudates, left TM clear, right TM obscured by cerumen impaction, mild scleral icterus present  Cardiovascular: regular rate and rhythm, no murmurs  Respiratory:  course crackles in the right lower lung field, clear to auscultation bilaterally otherwise  Abdomen:  soft, minimally distended, no masses organomegaly, nontender to palpation, there is a fluid wave present  Extremities:  2+ edema of bilateral lower extremities, right foot dorsum has approximately quarter sized wound which is dressed, some minimal purulent material coming from wound, DP pulses nonpalpable bilaterally, chronic venous stasis changes present, sensation intact to light touch over bilateral lower extremities Skin: Chronic venous stasis changes as above  Neuro:  grossly nonfocal, speech intact, full-strength in all extremities, oriented x3   Labs and  Imaging:  CBC  Recent Labs Lab 04/22/13 1359  WBC 11.0*  HGB 11.3*  HCT 35.4*  PLT 237     CMET  Recent Labs Lab 04/22/13 1359  NA 131*  K 4.8  CL 98  CO2 16*  BUN 29*  CREATININE 1.59*  GLUCOSE 116*  CALCIUM 9.2  AST 819*  ALT 597*  ALKPHOS 76  PROT 6.9  ALBUMIN 3.1*      Urinalysis    Component Value Date/Time   COLORURINE ORANGE* 04/22/2013 1719   APPEARANCEUR CLOUDY* 04/22/2013 1719   LABSPEC 1.029 04/22/2013 1719   PHURINE 5.0 04/22/2013 1719   GLUCOSEU NEGATIVE 04/22/2013 1719   HGBUR NEGATIVE 04/22/2013 1719   BILIRUBINUR MODERATE* 04/22/2013 1719   KETONESUR 15* 04/22/2013 1719   PROTEINUR 30* 04/22/2013 1719   UROBILINOGEN 4.0* 04/22/2013 1719   NITRITE NEGATIVE 04/22/2013 1719   LEUKOCYTESUR TRACE* 04/22/2013 1719   Lactate 5.86, then down to 5.63 Lipase 19 proBNP >2000  CT Abdomen: Mild ascites, without significant change since prior exam.  Diverticulosis. No radiographic evidence of diverticulitis.   U/S Abdomen: No evidence of gallstones or biliary dilatation.  Mild diffuse gallbladder wall thickening, which is a nonspecific finding. Mild ascites also noted.  Unremarkable sonographic appearance of the liver.  CXR:  Stable cardiomegaly.  New patchy changes in the right upper lobe which may represent an early infiltrate.  EKG: sinus rhythm, RBBB (previously seen)  Leeanne Rio, MD 04/22/2013, 9:07 PM PGY-2, [place] Intern pager: [phone], text pages welcome

## 2013-04-22 NOTE — ED Notes (Signed)
Patient transported to Ultrasound 

## 2013-04-23 ENCOUNTER — Inpatient Hospital Stay (HOSPITAL_COMMUNITY): Payer: Medicaid Other

## 2013-04-23 ENCOUNTER — Encounter (HOSPITAL_COMMUNITY): Payer: Self-pay | Admitting: Nurse Practitioner

## 2013-04-23 DIAGNOSIS — R609 Edema, unspecified: Secondary | ICD-10-CM

## 2013-04-23 LAB — URINALYSIS, ROUTINE W REFLEX MICROSCOPIC
Glucose, UA: NEGATIVE mg/dL
Ketones, ur: NEGATIVE mg/dL
Protein, ur: 30 mg/dL — AB
pH: 5 (ref 5.0–8.0)

## 2013-04-23 LAB — CBC WITH DIFFERENTIAL/PLATELET
Basophils Relative: 0 % (ref 0–1)
Eosinophils Absolute: 0 10*3/uL (ref 0.0–0.7)
Eosinophils Relative: 0 % (ref 0–5)
Lymphs Abs: 1 10*3/uL (ref 0.7–4.0)
MCH: 24.9 pg — ABNORMAL LOW (ref 26.0–34.0)
MCHC: 31.5 g/dL (ref 30.0–36.0)
MCV: 78.8 fL (ref 78.0–100.0)
Monocytes Absolute: 1.5 10*3/uL — ABNORMAL HIGH (ref 0.1–1.0)
Neutrophils Relative %: 78 % — ABNORMAL HIGH (ref 43–77)
Platelets: 228 10*3/uL (ref 150–400)
RBC: 5.15 MIL/uL (ref 4.22–5.81)
WBC: 11.3 10*3/uL — ABNORMAL HIGH (ref 4.0–10.5)

## 2013-04-23 LAB — COMPREHENSIVE METABOLIC PANEL
ALT: 1196 U/L — ABNORMAL HIGH (ref 0–53)
AST: 1265 U/L — ABNORMAL HIGH (ref 0–37)
Albumin: 3.5 g/dL (ref 3.5–5.2)
Alkaline Phosphatase: 88 U/L (ref 39–117)
Glucose, Bld: 109 mg/dL — ABNORMAL HIGH (ref 70–99)
Potassium: 5 mEq/L (ref 3.5–5.1)
Sodium: 129 mEq/L — ABNORMAL LOW (ref 135–145)
Total Protein: 7.4 g/dL (ref 6.0–8.3)

## 2013-04-23 LAB — COMPREHENSIVE METABOLIC PANEL WITH GFR
ALT: 951 U/L — ABNORMAL HIGH (ref 0–53)
AST: 1311 U/L — ABNORMAL HIGH (ref 0–37)
Albumin: 3 g/dL — ABNORMAL LOW (ref 3.5–5.2)
Alkaline Phosphatase: 68 U/L (ref 39–117)
BUN: 33 mg/dL — ABNORMAL HIGH (ref 6–23)
CO2: 18 meq/L — ABNORMAL LOW (ref 19–32)
Calcium: 9 mg/dL (ref 8.4–10.5)
Chloride: 99 meq/L (ref 96–112)
Creatinine, Ser: 1.55 mg/dL — ABNORMAL HIGH (ref 0.50–1.35)
GFR calc Af Amer: 54 mL/min — ABNORMAL LOW
GFR calc non Af Amer: 47 mL/min — ABNORMAL LOW
Glucose, Bld: 74 mg/dL (ref 70–99)
Potassium: 4.9 meq/L (ref 3.5–5.1)
Sodium: 132 meq/L — ABNORMAL LOW (ref 135–145)
Total Bilirubin: 3.2 mg/dL — ABNORMAL HIGH (ref 0.3–1.2)
Total Protein: 6.5 g/dL (ref 6.0–8.3)

## 2013-04-23 LAB — HEPATITIS PANEL, ACUTE: Hep A IgM: NONREACTIVE

## 2013-04-23 LAB — ACETAMINOPHEN LEVEL: Acetaminophen (Tylenol), Serum: <15 ug/mL (ref 10–30)

## 2013-04-23 LAB — CBC
Hemoglobin: 10.5 g/dL — ABNORMAL LOW (ref 13.0–17.0)
MCH: 25.3 pg — ABNORMAL LOW (ref 26.0–34.0)
MCV: 78.6 fL (ref 78.0–100.0)
Platelets: 231 10*3/uL (ref 150–400)
RBC: 4.15 MIL/uL — ABNORMAL LOW (ref 4.22–5.81)
WBC: 11.2 10*3/uL — ABNORMAL HIGH (ref 4.0–10.5)

## 2013-04-23 LAB — GLUCOSE, CAPILLARY
Glucose-Capillary: 122 mg/dL — ABNORMAL HIGH (ref 70–99)
Glucose-Capillary: 116 mg/dL — ABNORMAL HIGH (ref 70–99)

## 2013-04-23 LAB — URINE MICROSCOPIC-ADD ON

## 2013-04-23 LAB — PROTIME-INR
INR: 2.77 — ABNORMAL HIGH (ref 0.00–1.49)
Prothrombin Time: 28.3 seconds — ABNORMAL HIGH (ref 11.6–15.2)

## 2013-04-23 LAB — BILIRUBIN, FRACTIONATED(TOT/DIR/INDIR): Indirect Bilirubin: 1.5 mg/dL — ABNORMAL HIGH (ref 0.3–0.9)

## 2013-04-23 LAB — AMMONIA: Ammonia: 36 umol/L (ref 11–60)

## 2013-04-23 LAB — LACTIC ACID, PLASMA: Lactic Acid, Venous: 2.7 mmol/L — ABNORMAL HIGH (ref 0.5–2.2)

## 2013-04-23 LAB — HEMOGLOBIN A1C: Mean Plasma Glucose: 117 mg/dL — ABNORMAL HIGH (ref <117)

## 2013-04-23 MED ORDER — INSULIN ASPART 100 UNIT/ML ~~LOC~~ SOLN
0.0000 [IU] | Freq: Three times a day (TID) | SUBCUTANEOUS | Status: DC
  Administered 2013-04-23: 17:00:00 1 [IU] via SUBCUTANEOUS

## 2013-04-23 MED ORDER — LEVOFLOXACIN IN D5W 750 MG/150ML IV SOLN
750.0000 mg | INTRAVENOUS | Status: DC
  Administered 2013-04-23: 20:00:00 750 mg via INTRAVENOUS
  Filled 2013-04-23 (×2): qty 150

## 2013-04-23 MED ORDER — OXYMETAZOLINE HCL 0.05 % NA SOLN
1.0000 | Freq: Two times a day (BID) | NASAL | Status: DC
  Administered 2013-04-23 – 2013-04-25 (×4): 1 via NASAL
  Filled 2013-04-23: qty 15

## 2013-04-23 MED ORDER — FUROSEMIDE 10 MG/ML IJ SOLN: 40.0000 mg | Freq: Once | INTRAMUSCULAR | Status: DC

## 2013-04-23 MED ORDER — SODIUM CHLORIDE 0.9 % IV BOLUS (SEPSIS)
1000.0000 mL | Freq: Once | INTRAVENOUS | Status: AC
  Administered 2013-04-23: 22:00:00 500 mL via INTRAVENOUS

## 2013-04-23 NOTE — Progress Notes (Signed)
FPTS Interim Note: Came to see patient due to concern of shortness of breath. Patient states that when he lies flat, he becomes more short of breath. He also reports nasal congestion and difficulty breathing from that. He denies any chest pain.  On exam: he is speaking in full sentences without oxygen.  Filed Vitals:   04/23/13 1330  BP: 104/61  Pulse: 76  Temp: 97.6 F (36.4 C)  Resp: 18   CV: S1S2, no murmur, RRR Pulm: clear to auscultation, no wheezing or crackles Abdomen: non tender, slightly more distended than prior exam.   A/P: 62 yo male with h/o CHF, COPD, HTN who presented with elevated liver enzymes and pneumonia.  - shortness of breath: possibly from CHF given his prior history and his description of worsening with laying flat. O2 sats normal, which is reassuring.  - d/c fluids - hold on extra dose of lasix since he had a dose of lasix 40mg  po this morning. Will reassess if worsening dyspnea. Will consider repeating chest xray at that time too if worst.  - afrin nasal spray for congestion  Liam Graham, PGY-3 Family Medicine Resident

## 2013-04-23 NOTE — Progress Notes (Signed)
FPTS Interim Note: Was called to bedside by RN due to new onset confusion, low BP and temperature of 100.3 rectal.  On exam:  Patient is drowsy appearing, is alert and oriented to person and place but not time. He follows commands and speaks unintelligibly.  Abdomen is distended, non tender CV: S1S2, RRR, no murmur Chest: coarse breath sounds in bases, normal work of breathing Extr: 2+ pitting edema bilaterally, foot wound on right healing Neuro: PERRLA, grip strength symmetric, 4/5 strength in lower extremities bilaterally  A/P: 62 yo male with h/o CHF, recent elevated LFTs and abdominal pain, COPD with confusion and low grade temperature. Patient is already on levaquin for pneumonia found on chest xray.  - in context of low BP: check UA, Ucx, Blood Culture - start NS 1L bolus - check chest xray to rule out pulmonary edema that would preclude more fluids. - check troponin x3  - foley placed for better assessment of output.  - consider broadening antibiotics.  - there is also concern for SBP since he has had a change in mental status and low grade temp. He is allergic to penicillin with swelling which makes cefotaxime a difficult option. Discussed with pharmacy and levaquin is also an option for coverage. He is already on levaquin, so will continue with this antibiotic at this time. If his status continues to decline, will consider starting ertapenem.   Liam Graham, PGY-3 Family Medicine Resident

## 2013-04-23 NOTE — Progress Notes (Addendum)
ANTIBIOTIC CONSULT NOTE - INITIAL  Pharmacy Consult for levaquin Indication: pneumonia  Allergies  Allergen Reactions  . Penicillins Other (See Comments)    Swelling , long time ago.     Patient Measurements: Height: 5' 7.5" (171.5 cm) Weight: 214 lb 8.1 oz (97.3 kg) (scale A) IBW/kg (Calculated) : 67.25   Vital Signs: Temp: 97.5 F (36.4 C) (11/01 2308) Temp src: Oral (11/01 2308) BP: 111/80 mmHg (11/01 2308) Pulse Rate: 81 (11/01 2308) Intake/Output from previous day: 11/01 0701 - 11/02 0700 In: 801.3 [P.O.:120; I.V.:681.3] Out: 100 [Urine:100] Intake/Output from this shift: Total I/O In: 801.3 [P.O.:120; I.V.:681.3] Out: 100 [Urine:100]  Labs:  Recent Labs  04/22/13 1359  WBC 11.0*  HGB 11.3*  PLT 237  CREATININE 1.59*   Estimated Creatinine Clearance: 54.7 ml/min (by C-G formula based on Cr of 1.59). No results found for this basename: VANCOTROUGH, VANCOPEAK, VANCORANDOM, GENTTROUGH, GENTPEAK, GENTRANDOM, TOBRATROUGH, TOBRAPEAK, TOBRARND, AMIKACINPEAK, AMIKACINTROU, AMIKACIN,  in the last 72 hours   Microbiology: No results found for this or any previous visit (from the past 720 hour(s)).  Medical History: Past Medical History  Diagnosis Date  . COPD (chronic obstructive pulmonary disease)   . Gout   . Emphysema   . Hypertension   . Hyperlipidemia   . Tobacco abuse     30+ pack-year history    Medications:  Prescriptions prior to admission  Medication Sig Dispense Refill  . albuterol (PROVENTIL HFA;VENTOLIN HFA) 108 (90 BASE) MCG/ACT inhaler Inhale 2 puffs into the lungs every 4 (four) hours as needed. For shortness of breath       . allopurinol (ZYLOPRIM) 300 MG tablet Take 300 mg by mouth daily.      Marland Kitchen aspirin EC 81 MG tablet Take 81 mg by mouth daily.      Marland Kitchen b complex vitamins tablet Take 1 tablet by mouth daily.      . carvedilol (COREG) 3.125 MG tablet Take 3.125 mg by mouth 2 (two) times daily with a meal.      . colchicine 0.6 MG tablet  Take 0.6-1.2 mg by mouth daily. Take 2 tablets at the onset of gout symptoms, then take 1 tablet in an hour for a max of 3 tablets per 24 hours      . furosemide (LASIX) 40 MG tablet Take 40 mg by mouth daily. Take 1-2 times daily      . lubiprostone (AMITIZA) 24 MCG capsule Take 24 mcg by mouth 2 (two) times daily with a meal.      . metoprolol succinate (TOPROL-XL) 25 MG 24 hr tablet Take 25 mg by mouth daily.      Marland Kitchen omeprazole (PRILOSEC) 20 MG capsule Take 20 mg by mouth daily.      . potassium chloride SA (K-DUR,KLOR-CON) 20 MEQ tablet Take 20 mEq by mouth 2 (two) times daily.      . ranolazine (RANEXA) 1000 MG SR tablet Take 1,000 mg by mouth 2 (two) times daily.      Marland Kitchen tiotropium (SPIRIVA) 18 MCG inhalation capsule Place 18 mcg into inhaler and inhale daily.      Marland Kitchen lisinopril (PRINIVIL,ZESTRIL) 10 MG tablet Take 1 tablet (10 mg total) by mouth daily.  30 tablet  0   Assessment: 62 yo man to continue levaquin for HCAP.  CrCl ~55 ml/min.    Goal of Therapy:  Eradication of infection  Plan:  Levaquin 750 mg IV q24 hours. F/u cultures and clinical course.  Excell Seltzer Poteet 04/23/2013,12:15  AM

## 2013-04-23 NOTE — Progress Notes (Signed)
Family Medicine Teaching Service Daily Progress Note Intern Pager: [phone]  Patient name: [name] [name] Medical record number: [id number] Date of birth: 11/04/1950 Age: 62 y.o. Gender: male  Primary Care Provider: Fae Pippin Consultants: none Code Status: Full  Pt Overview and Major Events to Date:  04/23/13: Increased LFT's from previous day  Assessment and Plan: [name] [name] is a 62 y.o. male presenting with abdominal discomfort and distention, found to have AKI and markedly elevated LFT's . PMH is significant for CHF, COPD, HTN, and tobacco abuse.   # Transaminitis with elevated bilirubin, mild ascites: repeat LFT's today trending up with AST/ALT: 1311/951 and tbili: 3.2. Unclear etiology. Differential: shock liver vs acute hepatitis vs toxicity vs malignancy  - abd u/s and CT abdomen essentially unremarkable  - acute hepatitis panel pending  - repeat CMP in am - hold home colchicine, amitiza and allopurinol - obtain fractionated bili, tylenol level, PT/INR (for synthetic function) - if continues to trend up tomorrow, consult GI - if hepatitis panel normal tomorrow: consider alpha fetoprotein to rule out malignancy - consider IR diagnostic paracentesis   # AKI, metabolic acidosis, elevated lactic acid: lactic acid improving with bicarb improving. AG of 15.  Unclear whether Cr is at baseline, since we do not have recent records.  - decrease fluids from 125cc to 75cc/hr in context of h/o heart failure.   - hold home lisinopril (unclear if this is actually a medicine he takes though)  - will try obtaining records from [facility] where he was apparently hospitalized  # SOB: PNA vs CHF exacerbation, CXR with RUL infiltrate  - levaquin for HCAP (has been hospitalized in last several months)  - lasix 40mg  daily (home med)  - would be helpful to find out dry weight  - strict I&O, daily weights  - albuterol, spiriva, incentive spirometry  - continue home toprol XL, hold home  carvedilol (not sure why he is on two beta blockers), continue home ranolazine  - monitor on telemetry  - consider echo vs obtaining outside records   # Right foot wound: being managed by wound care as an outpatient.  - wound care in hospital  # Right leg swelling and erythema: appears different than left leg on exam.  - Lower extremity doppler to rule out DVT.   # Diarrhea:  - check C-diff  # Diabetes: pt reports he has DM, not on any medications per med list. CBG's in 70's - A1C pending  - sensitive SSI, CBGs qac & hs   # Reported hematuria: urine not grossly infected on UA. Likely change in color from bilirubin - will send urine culture  - monitor UOP  # Gout: hold colchicine, hold allopurinol # Tobacco abuse: will provide nicotine patch per pt request  FEN/GI: heart healthy diet, NS @ 75cc/hr Prophylaxis: heparin SQ   Disposition: admit to telemetry, pending improvement of AKI, SOB, & workup for elevated LFT's    Subjective:  Feeling better since admission. Has not ambulated out of bed, but feels less short of breath at rest.  No abdominal pain. Has had several watery non bloody bowel movements over night. No nausea, no vomiting. Eating and drinking well.   Objective: Temp:  [97.2 F (36.2 C)-98.5 F (36.9 C)] 97.4 F (36.3 C) (11/02 0517) Pulse Rate:  [75-86] 75 (11/02 0517) Resp:  [17-26] 17 (11/02 0517) BP: (96-123)/(46-95) 105/64 mmHg (11/02 0517) SpO2:  [89 %-100 %] 98 % (11/02 0815) Weight:  [213 lb 6.5 oz (96.8 kg)-215 lb (  97.523 kg)] 213 lb 6.5 oz (96.8 kg) (11/02 0517) Physical Exam: General: no acute distress, appears older than stated age, laying in bed Cardiovascular: S1S2, RRR, no murmur Respiratory: CTA, normal work of breathing, no crackles appreciated Abdomen: soft, non tender, distended  Extremities: 3+ pitting edema on right up to knee compared to 1+ on left with mild erythema on right leg.  Skin - abrasion with granulation tissue on dorsum of  right foot  Laboratory:  Recent Labs Lab 04/22/13 1359 04/23/13 0603  WBC 11.0* 11.2*  HGB 11.3* 10.5*  HCT 35.4* 32.6*  PLT 237 231    Recent Labs Lab 04/22/13 1359 04/23/13 0603  NA 131* 132*  K 4.8 4.9  CL 98 99  CO2 16* 18*  BUN 29* 33*  CREATININE 1.59* 1.55*  CALCIUM 9.2 9.0  PROT 6.9 6.5  BILITOT 2.8* 3.2*  ALKPHOS 76 68  ALT 597* 951*  AST 819* 1311*  GLUCOSE 116* 74     Imaging/Diagnostic Tests: CT abdomen: 04/22/13 FINDINGS:  Mild ascites again seen within the abdomen and pelvis, without  significant change. The liver, gallbladder, spleen, pancreas,  adrenal glands, and kidneys are unremarkable in appearance except  for small right renal cyst. Vicarious excretion of contrast noted in  the gallbladder as well as mild residual contrast enhancement of  both kidneys from the most recent CT. No soft tissue masses or  lymphadenopathy identified within the abdomen or pelvis. Residual  contrast seen in the urinary bladder.  Diverticulosis is seen mainly involving the sigmoid colon, however  there is no evidence of diverticulitis. No focal inflammatory  process or abscess identified. No evidence of bowel obstruction or  hernia.  IMPRESSION:  Mild ascites, without significant change since prior exam.  Diverticulosis. No radiographic evidence of diverticulitis.  [place]:  IMPRESSION:  No evidence of gallstones or biliary dilatation.  Mild diffuse gallbladder wall thickening, which is a nonspecific  finding. Mild ascites also noted.  Unremarkable sonographic appearance of the liver.  Kandis Nab, MD 04/23/2013, 10:26 AM PGY-3, [facility] Intern pager: [phone], text pages welcome

## 2013-04-23 NOTE — Progress Notes (Signed)
FMTS Attending Daily Note: Dorcas Mcmurray MD [phone] pager office [phone] I  have seen and examined this patient, reviewed their chart. I have discussed this patient with the resident. I agree with the resident's findings, assessment and care plan. Unclear etiology of elevated liver function tests. Checking hepatitis panel. Has had abdominal CT with and without contrast as well as an abdominal ultrasound without any findings to explain this. Unless there is some history of cirrhosis that we are unaware of or some as yet undetermined infectious process, I'm at a  bit of a loss for etiology. Does not look like his CHF is significant enough to have given him an episode of hypoperfusion that would give shock liver type of issue. Await hepatitis panel, consider consult GI. Amount of ascites seen on CT is minimal, I do not think we can successfully tap that but would consider having interventional radiology tap under ultrasound for diagnostic tap.

## 2013-04-23 NOTE — H&P (Signed)
FMTS Attending Admission Note: Cozette Braggs MD [phone] pager office [phone] I  have seen and examined this patient, reviewed their chart. I have discussed this patient with the resident. I agree with the resident's findings, assessment and care plan. 

## 2013-04-23 NOTE — Progress Notes (Signed)
*  PRELIMINARY RESULTS* Vascular Ultrasound Right lower extremity venous duplex has been completed.  Preliminary findings: no evidence of DVT.   Landry Mellow, RDMS, RVT  04/23/2013, 12:17 PM

## 2013-04-23 NOTE — Consult Note (Signed)
WOC wound consult note Reason for Consult:Partial thickness tissue injury at right foot, dorsal aspect. Patient cannot recall etiology of wound, states he "fell out" and can't remember anything after that. States that "his girlfriend helps him to care for this" (wound) and that is has gotten "much better". Wound type:traumatic Pressure Ulcer POA: No Measurement: 3cm x 5cm x 0.2cm Wound [id number], moist, granulating Drainage (amount, consistency, odor) scant amount serous exudate Periwound:intact with evidence of previous wound healing (conteraction, granulation) Dressing procedure/placement/frequency:I agree with POC initiated by bedside RN today and that is the placement of a soft silicone foam dressing over this wound. If edema stays out of foot and LE and patient elevates lower extremity,I would expect resolution of this wound within 30 days. [facility] [place] [facility] nursing team will not follow, but will remain available to this patient, the nursing and medical team.  Please re-consult if needed. Thanks, Maudie Flakes, MSN, RN, [place], [place], [place] [phone])

## 2013-04-23 NOTE — Progress Notes (Signed)
Upon entering room, patient is lethargic and confused.  He is alert to his self and place however disoriented to time and situation.  Manual SBP 94, HR 75, oxygen saturation 98% RA.  Patient is pulling on telemetry and IV lines.  Speech is slurred and there are delayed responses.  Rapid response called along with doctor who came up to see patient.  Will continue to monitor.

## 2013-04-23 NOTE — Progress Notes (Signed)
CRITICAL VALUE ALERT  Critical value received:  Troponin 1.11  Date of notification:  04/23/13  Time of notification:  2305  Critical value read back:yes  Nurse who received alert:  Waynetta Sandy, RN  MD notified (1st page):  Family Medicine  Time of first page:  2308  MD notified (2nd page):  Time of second page:  Responding MD:  Dr. Otis Dials  Time MD responded:  2310

## 2013-04-23 NOTE — Progress Notes (Signed)
Foley catheter inserted per order.  Minimal amount of dark yellow urine returned.  U/A sent per ordered.

## 2013-04-24 DIAGNOSIS — I509 Heart failure, unspecified: Secondary | ICD-10-CM

## 2013-04-24 DIAGNOSIS — I214 Non-ST elevation (NSTEMI) myocardial infarction: Secondary | ICD-10-CM

## 2013-04-24 LAB — COMPREHENSIVE METABOLIC PANEL
ALT: 1220 U/L — ABNORMAL HIGH (ref 0–53)
AST: 1454 U/L — ABNORMAL HIGH (ref 0–37)
Alkaline Phosphatase: 76 U/L (ref 39–117)
CO2: 16 mEq/L — ABNORMAL LOW (ref 19–32)
Calcium: 9.2 mg/dL (ref 8.4–10.5)
Chloride: 98 mEq/L (ref 96–112)
GFR calc Af Amer: 45 mL/min — ABNORMAL LOW (ref >90)
GFR calc non Af Amer: 39 mL/min — ABNORMAL LOW (ref >90)
Glucose, Bld: 98 mg/dL (ref 70–99)
Potassium: 5.1 mEq/L (ref 3.5–5.1)
Sodium: 132 mEq/L — ABNORMAL LOW (ref 135–145)
Total Bilirubin: 4.2 mg/dL — ABNORMAL HIGH (ref 0.3–1.2)

## 2013-04-24 LAB — IRON AND TIBC
Saturation Ratios: 5 % — ABNORMAL LOW (ref 20–55)
TIBC: 288 ug/dL (ref 215–435)
UIBC: 275 ug/dL (ref 125–400)

## 2013-04-24 LAB — CBC
Hemoglobin: 11.6 g/dL — ABNORMAL LOW (ref 13.0–17.0)
MCH: 25.1 pg — ABNORMAL LOW (ref 26.0–34.0)
MCHC: 32 g/dL (ref 30.0–36.0)
RBC: 4.63 MIL/uL (ref 4.22–5.81)
RDW: 20.9 % — ABNORMAL HIGH (ref 11.5–15.5)

## 2013-04-24 LAB — PROTIME-INR: Prothrombin Time: 35.4 seconds — ABNORMAL HIGH (ref 11.6–15.2)

## 2013-04-24 LAB — TROPONIN I
Troponin I: <0.3 ng/mL (ref <0.30)
Troponin I: 0.31 ng/mL (ref <0.30)

## 2013-04-24 LAB — HIV ANTIBODY (ROUTINE TESTING W REFLEX): HIV: NONREACTIVE

## 2013-04-24 LAB — BASIC METABOLIC PANEL
CO2: 18 mEq/L — ABNORMAL LOW (ref 19–32)
Calcium: 9.3 mg/dL (ref 8.4–10.5)
Creatinine, Ser: 1.65 mg/dL — ABNORMAL HIGH (ref 0.50–1.35)
GFR calc Af Amer: 50 mL/min — ABNORMAL LOW (ref >90)
GFR calc non Af Amer: 43 mL/min — ABNORMAL LOW (ref >90)
Glucose, Bld: 91 mg/dL (ref 70–99)
Potassium: 4.7 mEq/L (ref 3.5–5.1)
Sodium: 130 mEq/L — ABNORMAL LOW (ref 135–145)

## 2013-04-24 LAB — GLUCOSE, CAPILLARY
Glucose-Capillary: 104 mg/dL — ABNORMAL HIGH (ref 70–99)
Glucose-Capillary: 103 mg/dL — ABNORMAL HIGH (ref 70–99)

## 2013-04-24 LAB — FERRITIN: Ferritin: 82 ng/mL (ref 22–322)

## 2013-04-24 LAB — LACTIC ACID, PLASMA: Lactic Acid, Venous: 3.1 mmol/L — ABNORMAL HIGH (ref 0.5–2.2)

## 2013-04-24 LAB — AMMONIA: Ammonia: 55 umol/L (ref 11–60)

## 2013-04-24 MED ORDER — FUROSEMIDE 10 MG/ML IJ SOLN
40.0000 mg | Freq: Once | INTRAMUSCULAR | Status: AC
  Administered 2013-04-25: 05:00:00 40 mg via INTRAVENOUS

## 2013-04-24 MED ORDER — VANCOMYCIN HCL IN DEXTROSE 750-5 MG/150ML-% IV SOLN
750.0000 mg | Freq: Two times a day (BID) | INTRAVENOUS | Status: DC
  Administered 2013-04-24 – 2013-04-26 (×4): 750 mg via INTRAVENOUS
  Filled 2013-04-24 (×7): qty 150

## 2013-04-24 MED ORDER — LEVOFLOXACIN IN D5W 750 MG/150ML IV SOLN
750.0000 mg | INTRAVENOUS | Status: DC
  Administered 2013-04-25: 750 mg via INTRAVENOUS
  Filled 2013-04-24: qty 150

## 2013-04-24 MED ORDER — POLYETHYLENE GLYCOL 3350 17 G PO PACK
34.0000 g | PACK | Freq: Once | ORAL | Status: DC
  Filled 2013-04-24: qty 2

## 2013-04-24 NOTE — Progress Notes (Signed)
Notified Dr. Bonner Puna of patient stating he is has been having continuous hiccups. MD aware and will address. Orders given to continue to monitor to end of shift.

## 2013-04-24 NOTE — Progress Notes (Signed)
ANTIBIOTIC CONSULT NOTE - INITIAL  Pharmacy Consult for levaquin/vancomcyin Indication: pneumonia/sepsis  Allergies  Allergen Reactions  . Penicillins Other (See Comments)    Swelling , long time ago.     Patient Measurements: Height: 5' 7.5" (171.5 cm) Weight: 212 lb 11.9 oz (96.5 kg) IBW/kg (Calculated) : 67.25   Vital Signs: Temp: 97.6 F (36.4 C) (11/03 1317) Temp src: Oral (11/03 1317) BP: 115/79 mmHg (11/03 1317) Pulse Rate: 78 (11/03 1317) Intake/Output from previous day: 11/02 0701 - 11/03 0700 In: 1253 [P.O.:600; I.V.:3; IV Piggyback:650] Out: 1975 [id number] Intake/Output from this shift: Total I/O In: 480 [P.O.:480] Out: 500 [Urine:500]  Labs:  Recent Labs  04/23/13 0603 04/23/13 2127 04/24/13 0217  WBC 11.2* 11.3* 13.2*  HGB 10.5* 12.8* 11.6*  PLT 231 228 206  CREATININE 1.55* 1.77* 1.80*   Estimated Creatinine Clearance: 48.2 ml/min (by C-G formula based on Cr of 1.8). No results found for this basename: VANCOTROUGH, VANCOPEAK, VANCORANDOM, [place], [place], [place], [place], [place], TOBRARND, AMIKACINPEAK, AMIKACINTROU, AMIKACIN,  in the last 72 hours   Microbiology: Recent Results (from the past 720 hour(s))  CULTURE, BLOOD (ROUTINE X 2)     Status: None   Collection Time    04/22/13  5:35 PM      Result Value Range Status   Specimen Description BLOOD RIGHT ARM   Final   Special Requests BOTTLES DRAWN AEROBIC AND ANAEROBIC [facility]   Final   Culture  Setup Time     Final   Value: 04/23/2013 13:53     Performed at [facility]   Culture     Final   Value:        BLOOD CULTURE RECEIVED NO GROWTH TO DATE CULTURE WILL BE HELD FOR 5 DAYS BEFORE ISSUING A FINAL NEGATIVE REPORT     Performed at [facility]   Report Status PENDING   Incomplete  CULTURE, BLOOD (ROUTINE X 2)     Status: None   Collection Time    04/22/13  5:45 PM      Result Value Range Status   Specimen Description BLOOD RIGHT ARM   Final   Special  Requests BOTTLES DRAWN AEROBIC ONLY 10CC   Final   Culture  Setup Time     Final   Value: 04/23/2013 13:53     Performed at [facility]   Culture     Final   Value:        BLOOD CULTURE RECEIVED NO GROWTH TO DATE CULTURE WILL BE HELD FOR 5 DAYS BEFORE ISSUING A FINAL NEGATIVE REPORT     Performed at [facility]   Report Status PENDING   Incomplete   Assessment: 62 yo man to continue levaquin for HCAP. Now with possible sepsis. Tmin 96.5, wbc up to 13. Scr up to 1.8 with CrCl ~50 ml/min. Blood cxs ngtd.  Goal of Therapy:  Eradication of infection Vancomycin Trough 15-20  Plan:  Change Levaquin 750 mg IV q48 hours. Vancomycin 750mg  IV q12 hours Follow renal function  F/u cultures and clinical course.  Erin Hearing PharmD., BCPS Clinical Pharmacist Pager [phone] 04/24/2013 5:03 PM

## 2013-04-24 NOTE — Progress Notes (Signed)
Upon entering patients room this morning, he is alert and oriented x4.  He does not remember several events that have happened throughout the night but he does remember the "doctor who looked at his heart."  Will continue to monitor patient.

## 2013-04-24 NOTE — Plan of Care (Signed)
Problem: Phase I Progression Outcomes Goal: Pain controlled with appropriate interventions Outcome: Completed/Met Date Met:  04/24/13 Pt with no complaints of pain, only tenderness to abdomen when palpated Goal: Voiding-avoid urinary catheter unless indicated Outcome: Not Progressing Pt with foley catheter at this time per MD order

## 2013-04-24 NOTE — Consult Note (Signed)
Patient seen, examined, and I agree with the above documentation, including the assessment and plan. Markedly elevated LFTs, could be related to shock liver, though no absolute evidence of severe hypotension, syncope, etc. Also with right sided heart failure which would explain ascites and LE edema. Right HF certainly can cause passive congestion and elevated LFTs, but usually this gives a more cholestatic picture.  Liver injury pattern at present is mixed, but certainly hepatocellular with AST/ALT>1000 Need to exclude other causes Will check liver doppers to exclude portal vein clot, doubt hepatic vein clot without significant pain, but needs to excluded. INR is elevated making clot less likely but not impossible.  Coagulopathy is out of portion to what I would expect with passive congestion, and also there is no cross-sectional evidence of liver nodularity/cirrhosis.  Would follow INR daily and further increase would be an ominous sign Check ANA, IgG, ASMA, ceruloplasmin, alpha one, AMA, ferritin.  Hep C neg Hep B exposure with hx of core ab positive, but not evidence for active hep b (surg ag is neg)

## 2013-04-24 NOTE — Consult Note (Signed)
[facility] Consult: 11:15 AM 04/24/2013  LOS: 2 days    Referring Provider: Brendia Sacks MDs of teaching service  Primary Care Physician:  [name] Primary Gastroenterologist:  None, unassigned.      Reason for Consultation:  Abnormal transaminases    HPI: [name] [name] is a 62 y.o. male. Type 2 DM.  Hx severe myopathy, rhabdo, vent acquired PNA during 7/7 - 01/12/2013 [facility] hospital admission for CHF, weakness. EF was 30 - 35%, dilated left and mild right ventricles on Echo. Right cephalic vein totally occluded with thrombophlebitis on Doppler.  He was treated for C diff. Notes mention bradycardia and PEA arrest.   During 12/2012 abnormal LFTs mentioned in discharge from [facility] and "was likely multifactorial related to the myopathy, rhabdomyolysis and critical illness. Possibly due to shock liver during hypotensive episode during PEA. Progressively improved"  CT abdomen then with unremarkable liver, biliary tree, pancreas, GB.  No mention of ascites but did show " Mild body wall edema and incidental sigmoid diverticulosis"  T bili to 8.8, AST/ALT to max 250s/150s, alk phos to 170s  in setting of CPK to1900s.  ETOH level negative.  Hepatitis B core total (IGG and IGM) were positive.  Hep B surface AG was negative.  ANA was + on one of two assays, titer was 160.  At discharge they had stopped Amitiza, Loperamide,   Now admitted 2 days ago with progressive weakness, not dyspnea.  + LE edema. +abdominal distention and sense of tightness in abdomen. This has been occuring for at least one month.  No nausea, no abdominal pain, just feels tight.  Distention has caused diminished appetite. Stable BMs 2 to 3 per week, has occasionally used Rx laxative (presume this is Amitiza).  No blood or melena.  No nausea, no dysphagia.    Labs reveal acute kidney injury, and abnormal LFTs, metabollic acidosis, PNA  vs CHF flare.  Cardiologist notes LE edema and increasing abdominal girth, very elevated JVP.  This along with renal failure and LFT abnormalities lead him to surmise pt has right sided heart failure. Echocardiogram planned.  Diuresis complicated by hypotension. Cardiologist also called a non STEMI secondary to above complications rather than acute coronary event.  No plans to anticoagulate given the coagulopathy (INR has gone from 2.7 to 3.7).  Platelets are WNL. Transaminases 1400s and 1200s, T bili 4.2.  Alk phos is normal.  Sodium is low at 129 - 132.    Repeated acute hepatitis panel and it is negative.  Non contrast CT scan shows mild ascites but no hepato-biliary disease. Ultrasound shows non-specific thickening in GB wall, mild peri-hepatic ascites. ascites, 3 mm CBD, normal liver  Hears enlarged and ? Early righ UL infiltrate on CXR  ETOH Hx of 2 to 3 drinks per day, none since about 2012.           Past Medical History  Diagnosis Date  . COPD (chronic obstructive pulmonary disease)   . Gout   . Emphysema   . Hypertension   . Hyperlipidemia   . Tobacco abuse     30+ pack-year history  . Diabetes mellitus without complication   . CHF (congestive heart failure)     Past Surgical History  Procedure Laterality Date  . Appendectomy  as a child    Prior to Admission medications   Medication Sig Start Date End Date Taking? Authorizing Provider  albuterol (PROVENTIL HFA;VENTOLIN HFA) 108 (90 BASE) MCG/ACT inhaler Inhale 2  puffs into the lungs every 4 (four) hours as needed. For shortness of breath    Yes Historical Provider, MD  allopurinol (ZYLOPRIM) 300 MG tablet Take 300 mg by mouth daily.   Yes Historical Provider, MD  aspirin EC 81 MG tablet Take 81 mg by mouth daily.   Yes Historical Provider, MD  b complex vitamins tablet Take 1 tablet by mouth daily.   Yes Historical Provider, MD  carvedilol (COREG) 3.125 MG tablet Take 3.125 mg by mouth 2 (two) times daily with a  meal.   Yes Historical Provider, MD  colchicine 0.6 MG tablet Take 0.6-1.2 mg by mouth daily. Take 2 tablets at the onset of gout symptoms, then take 1 tablet in an hour for a max of 3 tablets per 24 hours   Yes Historical Provider, MD  furosemide (LASIX) 40 MG tablet Take 40 mg by mouth daily. Take 1-2 times daily   Yes Historical Provider, MD  lubiprostone (AMITIZA) 24 MCG capsule Take 24 mcg by mouth 2 (two) times daily with a meal.   Yes Historical Provider, MD  metoprolol succinate (TOPROL-XL) 25 MG 24 hr tablet Take 25 mg by mouth daily.   Yes Historical Provider, MD  omeprazole (PRILOSEC) 20 MG capsule Take 20 mg by mouth daily.   Yes Historical Provider, MD  potassium chloride SA (K-DUR,KLOR-CON) 20 MEQ tablet Take 20 mEq by mouth 2 (two) times daily.   Yes Historical Provider, MD  ranolazine (RANEXA) 1000 MG SR tablet Take 1,000 mg by mouth 2 (two) times daily.   Yes Historical Provider, MD  tiotropium (SPIRIVA) 18 MCG inhalation capsule Place 18 mcg into inhaler and inhale daily.   Yes Historical Provider, MD  lisinopril (PRINIVIL,ZESTRIL) 10 MG tablet Take 1 tablet (10 mg total) by mouth daily. 05/20/11 05/19/12  Rosalia Hammers, MD    Scheduled Meds: . aspirin EC  81 mg Oral Daily  . insulin aspart  0-9 Units Subcutaneous TID WC  . levofloxacin (LEVAQUIN) IV  750 mg Intravenous Q24H  . metoprolol succinate  25 mg Oral Daily  . nicotine  7 mg Transdermal Daily  . oxymetazoline  1 spray Each Nare BID  . pantoprazole  40 mg Oral Daily  . ranolazine  1,000 mg Oral BID  . sodium chloride  3 mL Intravenous Q12H  . tiotropium  18 mcg Inhalation Daily   Infusions:   PRN Meds: albuterol, ondansetron (ZOFRAN) IV, ondansetron, polyethylene glycol   Allergies as of 04/22/2013 - Review Complete 04/22/2013  Allergen Reaction Noted  . Penicillins Other (See Comments) 05/17/2011    Family History  Problem Relation Age of Onset  . Diabetes Mother   . Hypertension    . Heart attack       Brothers       No colon cancer, no anemia  History   Social History  . Marital Status: Significant Other    Spouse Name: N/A    Number of Children: N/A  . Years of Education: N/A   Occupational History  . Retired.  Was Conservation officer, historic buildings    Social History Main Topics  . Smoking status: Former Smoker -- 1.00 packs/day for 30 years    Quit date: 05/11/2011  . Smokeless tobacco: Not on file  . Alcohol Use: No  . Drug Use: No  . Sexual Activity: Not on file    Social History Narrative  . Lives with long term GF    REVIEW OF SYSTEMS: Constitutional:  + fatigue and weakness.  ENT:  + nasal congestion Pulm:  Orthopnea,  CV:  No chest pain, no palpitations GU:  No oliguria, no dysuria.  No frequency. No scrotal edema.  GI:  Per HPI Heme:  No hx of anemia.  No excessive bleeding or bruising.    Transfusions:  None in past Neuro:  + "fell out", poor recall of any medical events. Disoriented last PM, pulling on IV lines etc.  MS:  Walks with cane, no significant DOE or cough. Derm:  r foot pressure ulcer. RR team called  Endocrine:  No sweats or chills.  No excessive thirst.  Sugars at home hardly ever above 150. Immunization:  Up to date flu shot Travel:  None except in [place] and [employer].    PHYSICAL EXAM: Vital signs in last 24 hours: Filed Vitals:   04/24/13 1019  BP: 118/87  Pulse: 82  Temp: 97.3 F (36.3 C)  Resp: 20   Wt Readings from Last 3 Encounters:  04/24/13 96.5 kg (212 lb 11.9 oz)  05/20/11 104.2 kg (229 lb 11.5 oz)   General: not acutely ill looking but is SOB with speach Head:  No swelling  Eyes:  No pallor.  + mild icterus Ears:  Not HOH.  Nose:  +congested Mouth:  Very few teeth.  Moist, pink and clear oral MM Neck:  No JVD Lungs:  Clear bil.  No resting dyspnea.  Occasional non-productive cough. Heart: RRR.  No MRG Abdomen:  Soft, distended/protuberant.  Fullness in right abdomen.  No jugular venous reflux.   Rectal: none  performed   Musc/Skeltl: no joint contractures or deformities Extremities:  Slight right > left below knee edema. Right dorsum of foot with bandage covering a sore GU:  No scrotal edema.  Foley in place  Neurologic:  Oriented x 3.  Slow but appropriate responses.  Seems reliable.  No tremor or limb weakness Skin:  No telangectasia.  Tattoos:  none Nodes:  No cervical adenopathy   Psych:  Pleasant, alert, appropriate.  No agitation.   Intake/Output from previous day: 11/02 0701 - 11/03 0700 In: 1253 [P.O.:600; I.V.:3; IV Piggyback:650] Out: 1975 [id number] Intake/Output this shift: Total I/O In: 240 [P.O.:240] Out: 100 [Urine:100]  LAB RESULTS:  Recent Labs  04/23/13 0603 04/23/13 2127 04/24/13 0217  WBC 11.2* 11.3* 13.2*  HGB 10.5* 12.8* 11.6*  HCT 32.6* 40.6 36.2*  PLT 231 228 206   BMET Lab Results  Component Value Date   NA 132* 04/24/2013   NA 129* 04/23/2013   NA 132* 04/23/2013   K 5.1 04/24/2013   K 5.0 04/23/2013   K 4.9 04/23/2013   CL 98 04/24/2013   CL 94* 04/23/2013   CL 99 04/23/2013   CO2 16* 04/24/2013   CO2 16* 04/23/2013   CO2 18* 04/23/2013   GLUCOSE 98 04/24/2013   GLUCOSE 109* 04/23/2013   GLUCOSE 74 04/23/2013   BUN 37* 04/24/2013   BUN 35* 04/23/2013   BUN 33* 04/23/2013   CREATININE 1.80* 04/24/2013   CREATININE 1.77* 04/23/2013   CREATININE 1.55* 04/23/2013   CALCIUM 9.2 04/24/2013   CALCIUM 9.4 04/23/2013   CALCIUM 9.0 04/23/2013   LFT  Recent Labs  04/23/13 0603 04/23/13 1210 04/23/13 2127 04/24/13 0217  PROT 6.5  --  7.4 6.7  ALBUMIN 3.0*  --  3.5 3.2*  AST 1311*  --  1265* 1454*  ALT 951*  --  1196* 1220*  ALKPHOS 68  --  88 76  BILITOT 3.2* 3.6* 4.1*  4.2*  BILIDIR  --  2.1*  --   --   IBILI  --  1.5*  --   --    PT/INR Lab Results  Component Value Date   INR 3.71* 04/24/2013   INR 2.77* 04/23/2013       Protime                  35.4                                                                 04/24/13 Hepatitis Panel  Recent  Labs  04/22/13 1735  HEPBSAG NEGATIVE  HCVAB NEGATIVE  HEPAIGM NON REACTIVE  HEPBIGM NON REACTIVE   C-Diff No components found with this basename: cdiff     RADIOLOGY STUDIES: Ct Abdomen Pelvis Wo Contrast 04/22/2013   CLINICAL DATA:  Abdominal pain and distention.  EXAM: CT ABDOMEN AND PELVIS WITHOUT CONTRAST  TECHNIQUE: Multidetector CT imaging of the abdomen and pelvis was performed following the standard protocol without intravenous contrast.  COMPARISON:  04/21/2013  FINDINGS: Mild ascites again seen within the abdomen and pelvis, without significant change. The liver, gallbladder, spleen, pancreas, adrenal glands, and kidneys are unremarkable in appearance except for small right renal cyst. Vicarious excretion of contrast noted in the gallbladder as well as mild residual contrast enhancement of both kidneys from the most recent CT. No soft tissue masses or lymphadenopathy identified within the abdomen or pelvis. Residual contrast seen in the urinary bladder.  Diverticulosis is seen mainly involving the sigmoid colon, however there is no evidence of diverticulitis. No focal inflammatory process or abscess identified. No evidence of bowel obstruction or hernia.  IMPRESSION: Mild ascites, without significant change since prior exam.  Diverticulosis. No radiographic evidence of diverticulitis.   Electronically Signed   By: Earle Gell M.D.   On: 04/22/2013 20:39   Dg Chest 2 View 04/22/2013    FINDINGS: The heart is again enlarged in size but stable. The lungs are well aerated bilaterally. Some very mild patchy changes are noted in the right upper lobe which may represent some early infiltrate.  IMPRESSION: Stable cardiomegaly.  New patchy changes in the right upper lobe which may represent an early infiltrate.   Electronically Signed   By: Inez Catalina M.D.   On: 04/22/2013 14:51   US Abdomen Complete 04/22/2013   .  FINDINGS: Gallbladder  No gallstones identified. Mild diffuse gallbladder wall  thickening seen measuring 4 mm, however no sonographic Murphy sign was noted.  Common bile duct  Diameter: 3 mm  Liver  No focal lesion identified. Within normal limits in parenchymal echogenicity. Mild perihepatic ascites noted.  IVC  No abnormality visualized.  Pancreas  Visualized portion unremarkable.  Spleen  Size and appearance within normal limits.  Right Kidney  Length: 10.3 cm. Echogenicity within normal limits. No mass or hydronephrosis visualized.  Left Kidney  Length: 10.4 cm. Echogenicity within normal limits. No mass or hydronephrosis visualized.  Abdominal aorta  No aneurysm visualized.  IMPRESSION: No evidence of gallstones or biliary dilatation.  Mild diffuse gallbladder wall thickening, which is a nonspecific finding. Mild ascites also noted.  Unremarkable sonographic appearance of the liver.   Electronically Signed   By: Earle Gell M.D.   On: 04/22/2013  16:46   Dg Chest Port 1 View 04/23/2013   FINDINGS: Portable AP semi upright view at 2056 hrs. Stable cardiomegaly and mediastinal contours. Previously seen subtle patchy right lung opacity does not persist. No pulmonary edema, pleural effusion, pneumothorax, or consolidation. Stable visualized osseous structures.  IMPRESSION: Interval resolved nonspecific patchy pulmonary opacity. Stable cardiomegaly and no acute cardiopulmonary abnormality.   Electronically Signed   By: Lars Pinks M.D.   On: 04/23/2013 21:09    ENDOSCOPIC STUDIES: Pt denies previous colonoscopy or flex sig.   IMPRESSION:   *  Recurrent acute elevation of LFTs.  Similar to 12/2012 when acutely ill with Rhabdo.   Agree that this is likely sequelae of right heart failure.  Echo will be done in AM 11/4.  Total  Hep B core Ab was + in 12/2012, the Hep B core IgM is negative on 04/22/13.  His Pt and INR are elevated c/w hepatic dysfunction.   *  Acute renal failure  *  CHF +/_ community acquired PNA.  *  Type 2 DM. A1C is 5.7.  Good control   *  Constipation treated  with Amitiza prn at home.  Treated with Vanco po for C diff in 12/2012.    PLAN:     *  Give Miralax as it's been several days since last BM.   *  Note made of AFP tumor marker ordered.  With normal looking liver on [place] and CT not clear this is necessary.  I discontinued the C diff order as the pt tells me it has been at least 3 days since last BM.  Added coags and cmet for am.   *  Good idea to hold Zyloprim in setting of liver failure.   Azucena Freed  04/24/2013, 11:15 AM Pager: [phone]  Call from RN:  Pt had BM 11/2 so I cancelled the double dose of miralax I ordered. Still do not think C diff PCR is indicated.

## 2013-04-24 NOTE — Progress Notes (Signed)
CRITICAL VALUE ALERT  Critical value received:  Troponin 0.31  Date of notification:  *04/24/2013  Time of notification:  [id number]  Critical value read back:yes  Nurse who received alert:  Jannifer Rodney  MD notified (1st page):  Dr. Bonner Puna  Time of first page:  66  MD notified (2nd page): n/a  Time of second page:n/a  Responding MD: Dr. Bonner Puna  Time MD responded:  1200

## 2013-04-24 NOTE — Progress Notes (Signed)
Family Medicine Teaching Service Daily Progress Note Intern Pager: [phone]  Patient name: [name] [name] Medical record number: [id number] Date of birth: Dec 25, 1950 Age: 61 y.o. Gender: male  Primary Care Provider: Fae [name] Consultants: none Code Status: Full  Pt Overview and Major Events to Date:  04/23/13: Increased LFT's from previous day  Assessment and Plan: [name] [name] is a 61 y.o. male presenting with abdominal discomfort and distention, found to have AKI and markedly elevated LFT's . PMH is significant for CHF, COPD, HTN, and tobacco abuse.   # Transaminitis with elevated bilirubin, mild ascites: repeat LFT's today trending up with AST/ALT: 1311/951 and tbili: 3.2. Unclear etiology. Differential: shock liver vs acute hepatitis vs toxicity vs malignancy  - abd u/s and CT abdomen essentially unremarkable  - acute hepatitis panel negative, tylenol level neg - repeat CMP in am - hold home colchicine, amitiza and allopurinol - INR elevated >2 without any anticoagulation, recheck coags today - will consult GI today due to continued rise in transaminases - consider alpha fetoprotein to evaluate for malignancy - consider IR diagnostic paracentesis, will await GI recs before proceeding with this  # Fever overnight: ? Secondary to PNA vs. SBP vs. Other? - avoid tylenol given marked transaminitis  # AKI, metabolic acidosis, elevated lactic acid: lactic acid improving with bicarb improving. AG of 15.  Unclear whether Cr is at baseline, since we do not have recent records.  - fluid status slightly tenuous, required bolus overnight, holding lasix currently - hold home lisinopril (unclear if this is actually a medicine he takes though)  - will try obtaining records from [facility] where he was apparently hospitalized - recheck lactic acid today, decreased to 2.7 yesterday  # SOB: PNA vs CHF exacerbation, CXR with RUL infiltrate  - levaquin for HCAP (has been hospitalized in last  several months)  - holding lasix secondary to hypotension overnight - would be helpful to find out dry weight - need to call PCP - strict I&O, daily weights  - albuterol, spiriva, incentive spirometry  - continue home toprol XL, hold home carvedilol (not sure why he is on two beta blockers), continue home ranolazine  - monitor on telemetry  - troponin elevated overnight, with subsequent trop neg, cardiology consulted and advised getting echo - await echo today  # Right foot wound: being managed by wound care as an outpatient.  - wound care in hospital  # Right leg swelling and erythema: appears different than left leg on exam, however calf is nontender - Lower extremity doppler to rule out DVT - prelim read is negative  # Diarrhea:  - check C-diff  # Diabetes: pt reports he has DM, not on any medications per med list. CBG's in 70's - A1C 5.7, will d/c cbg's & SSI  # Reported hematuria: urine not grossly infected on UA. Likely change in color from bilirubin - will send urine culture  - monitor UOP  # Gout: hold colchicine, hold allopurinol # Tobacco abuse: will provide nicotine patch per pt request  FEN/GI: heart healthy diet, NS @ 75cc/hr Prophylaxis: heparin SQ   Disposition: may require transfer to SDU or ICU today depending on stability, currently stable. If decompensates at all will call CCM today. Awaiting continued cardiology recs & initial GI recs.   Subjective:  Pt reports feeling well today. Denies CP or SOB. Was febrile overnight with rectal temp of 100.3  Objective: Temp:  [96.5 F (35.8 C)-100.3 F (37.9 C)] 97.5 F (36.4 C) (11/03 [id number])  Pulse Rate:  [75-91] 79 (11/03 0647) Resp:  [18-20] 18 (11/03 0647) BP: (104-126)/(61-88) 123/85 mmHg (11/03 0647) SpO2:  [98 %-100 %] 98 % (11/03 0757) Weight:  [212 lb 11.9 oz (96.5 kg)] 212 lb 11.9 oz (96.5 kg) (11/03 [id number]) Physical Exam: General: no acute distress, appears older than stated age, laying in bed, sits up on  side of bed with assistance Cardiovascular: S1S2, RRR, no murmur Respiratory: CTA, normal work of breathing, no crackles appreciated, decreased air movement over RUL Abdomen: soft, non tender, distended, + BS Extremities: mild erythema of right leg, but legs equal in warmth, no tenderness, mild edema bilaterally Skin - did not remove socks today Neuro: speech normal, follows commands, oriented to person, place, year, and season, knows day of week, confused as to October vs. November  Laboratory:  Recent Labs Lab 04/23/13 0603 04/23/13 2127 04/24/13 0217  WBC 11.2* 11.3* 13.2*  HGB 10.5* 12.8* 11.6*  HCT 32.6* 40.6 36.2*  PLT 231 228 206    Recent Labs Lab 04/23/13 0603 04/23/13 1210 04/23/13 2127 04/24/13 0217  NA 132*  --  129* 132*  K 4.9  --  5.0 5.1  CL 99  --  94* 98  CO2 18*  --  16* 16*  BUN 33*  --  35* 37*  CREATININE 1.55*  --  1.77* 1.80*  CALCIUM 9.0  --  9.4 9.2  PROT 6.5  --  7.4 6.7  BILITOT 3.2* 3.6* 4.1* 4.2*  ALKPHOS 68  --  88 76  ALT 951*  --  1196* 1220*  AST 1311*  --  1265* 1454*  GLUCOSE 74  --  109* 98     Imaging/Diagnostic Tests: CT abdomen: 04/22/13 FINDINGS:  Mild ascites again seen within the abdomen and pelvis, without  significant change. The liver, gallbladder, spleen, pancreas,  adrenal glands, and kidneys are unremarkable in appearance except  for small right renal cyst. Vicarious excretion of contrast noted in  the gallbladder as well as mild residual contrast enhancement of  both kidneys from the most recent CT. No soft tissue masses or  lymphadenopathy identified within the abdomen or pelvis. Residual  contrast seen in the urinary bladder.  Diverticulosis is seen mainly involving the sigmoid colon, however  there is no evidence of diverticulitis. No focal inflammatory  process or abscess identified. No evidence of bowel obstruction or  hernia.  IMPRESSION:  Mild ascites, without significant change since prior exam.   Diverticulosis. No radiographic evidence of diverticulitis.  [place]:  IMPRESSION:  No evidence of gallstones or biliary dilatation.  Mild diffuse gallbladder wall thickening, which is a nonspecific  finding. Mild ascites also noted.  Unremarkable sonographic appearance of the liver.   CXR 11/2: Interval resolved nonspecific patchy pulmonary opacity. Stable cardiomegaly and no acute cardiopulmonary abnormality.   Medications: Scheduled Meds: . aspirin EC  81 mg Oral Daily  . insulin aspart  0-9 Units Subcutaneous TID WC  . levofloxacin (LEVAQUIN) IV  750 mg Intravenous Q24H  . metoprolol succinate  25 mg Oral Daily  . nicotine  7 mg Transdermal Daily  . oxymetazoline  1 spray Each Nare BID  . pantoprazole  40 mg Oral Daily  . ranolazine  1,000 mg Oral BID  . sodium chloride  3 mL Intravenous Q12H  . tiotropium  18 mcg Inhalation Daily   Continuous Infusions:  PRN Meds:.albuterol, ondansetron (ZOFRAN) IV, ondansetron, polyethylene glycol   Leeanne Rio, MD 04/24/2013, 9:09 AM PGY-2, [place]  [place] Intern pager: [phone], text pages welcome

## 2013-04-24 NOTE — Clinical Documentation Improvement (Signed)
THIS DOCUMENT IS NOT A PERMANENT PART OF THE MEDICAL RECORD  Please update your documentation with the medical record to reflect your response to this query. If you need help knowing how to do this please call [phone].  04/24/13  Dear Dr. [facility],  In a better effort to capture your patient's severity of illness, reflect appropriate length of stay and utilization of resources, a review of the patient medical record has revealed the following indicators the diagnosis of Heart Failure.    Based on your clinical judgment, please clarify and document CHF TYPE & ACUITY in a progress note and/or discharge summary the clinical condition associated with the following supporting information:  In responding to this query please exercise your independent judgment.  The fact that a query is asked, does not imply that any particular answer is desired or expected.  Possible Clinical Conditions?  Chronic Systolic Congestive Heart Failure Chronic Diastolic Congestive Heart Failure Chronic Systolic & Diastolic Congestive Heart Failure Acute Systolic Congestive Heart Failure Acute Diastolic Congestive Heart Failure Acute Systolic & Diastolic Congestive Heart Failure Acute on Chronic Systolic Congestive Heart Failure Acute on Chronic Diastolic Congestive Heart Failure Acute on Chronic Systolic & Diastolic  Congestive Heart Failure Other Condition Cannot Clinically Determine  Supporting Information:  Risk Factors:(As per notes) " A/P: 62 yo male with h/o CHF, COPD, HTN who presented with elevated liver enzymes and pneumonia." "- shortness of breath: possibly from CHF given his prior history "  Signs & Symptoms: (AS PER NOTES) "Hx of CHF", " Right Sided Heart Failure", "# SOB: PNA vs CHF exacerbation"   Diagnostics: "CXR with RUL infiltrate" furosemide (LASIX) 40 MG tablet [id number]   Order Details    Dose: 40 mg Route: Oral Frequency: Daily       Treatment: tiotropium  (SPIRIVA) inhalation capsule 18 mcg   [id number]    Ordered Dose: 18 mcg Route: Inhalation Frequency: Daily   albuterol (PROVENTIL HFA;VENTOLIN HFA) 108 (90 BASE) MCG/ACT inhaler 2 puff   [id number]    Ordered Dose: 2 puff Route: Inhalation          Reviewed: additional documentation in the medical record  Thank You,  Alessandra Grout RN, [name], CCDS Clinical Documentation Specialist: [phone] Cell= [place]

## 2013-04-24 NOTE — Progress Notes (Addendum)
Patient ID: [name] [name], male   DOB: 1951-06-03, 62 y.o.   MRN: [id number] Subjective:  Dyspnea and lower extremity swelling improved.  Objective:  Vital Signs in the last 24 hours: Temp:  [96.5 F (35.8 C)-100.3 F (37.9 C)] 97.6 F (36.4 C) (11/03 1317) Pulse Rate:  [75-91] 78 (11/03 1317) Resp:  [18-20] 18 (11/03 1317) BP: (114-126)/(74-88) 115/79 mmHg (11/03 1317) SpO2:  [98 %-100 %] 100 % (11/03 1317) Weight:  [212 lb 11.9 oz (96.5 kg)] 212 lb 11.9 oz (96.5 kg) (11/03 0647)  Intake/Output from previous day: 11/02 0701 - 11/03 0700 In: 1253 [P.O.:600; I.V.:3; IV Piggyback:650] Out: 1975 [id number] Intake/Output from this shift:    Physical Exam: Well appearing NAD HEENT: Unremarkable Neck: 6 cm JVD, no thyromegally Back:  No CVA tenderness Lungs:  Clear with scattered basilar rales HEART:  Regular rate rhythm, no murmurs, no rubs, no clicks Abd:  soft, positive bowel sounds, no organomegally, no rebound, no guarding Ext:  2 plus pulses, no edema, no cyanosis, no clubbing Skin:  No rashes no nodules Neuro:  CN II through XII intact, motor grossly intact  Lab Results:  Recent Labs  04/23/13 2127 04/24/13 0217  WBC 11.3* 13.2*  HGB 12.8* 11.6*  PLT 228 206    Recent Labs  04/24/13 0217 04/24/13 1650  NA 132* 130*  K 5.1 4.7  CL 98 98  CO2 16* 18*  GLUCOSE 98 91  BUN 37* 40*  CREATININE 1.80* 1.65*    Recent Labs  04/24/13 0217 04/24/13 1000  TROPONINI <0.30 0.31*   Hepatic Function Panel  Recent Labs  04/23/13 1210  04/24/13 0217  PROT  --   < > 6.7  ALBUMIN  --   < > 3.2*  AST  --   < > 1454*  ALT  --   < > 1220*  ALKPHOS  --   < > 76  BILITOT 3.6*  < > 4.2*  BILIDIR 2.1*  --   --   IBILI 1.5*  --   --   < > = values in this interval not displayed. No results found for this basename: CHOL,  in the last 72 hours No results found for this basename: PROTIME,  in the last 72 hours  Imaging: Ct Abdomen Pelvis Wo Contrast  04/22/2013    CLINICAL DATA:  Abdominal pain and distention.  EXAM: CT ABDOMEN AND PELVIS WITHOUT CONTRAST  TECHNIQUE: Multidetector CT imaging of the abdomen and pelvis was performed following the standard protocol without intravenous contrast.  COMPARISON:  04/21/2013  FINDINGS: Mild ascites again seen within the abdomen and pelvis, without significant change. The liver, gallbladder, spleen, pancreas, adrenal glands, and kidneys are unremarkable in appearance except for small right renal cyst. Vicarious excretion of contrast noted in the gallbladder as well as mild residual contrast enhancement of both kidneys from the most recent CT. No soft tissue masses or lymphadenopathy identified within the abdomen or pelvis. Residual contrast seen in the urinary bladder.  Diverticulosis is seen mainly involving the sigmoid colon, however there is no evidence of diverticulitis. No focal inflammatory process or abscess identified. No evidence of bowel obstruction or hernia.  IMPRESSION: Mild ascites, without significant change since prior exam.  Diverticulosis. No radiographic evidence of diverticulitis.   Electronically Signed   By: Earle Gell M.D.   On: 04/22/2013 20:39   Dg Chest Port 1 View  04/23/2013   CLINICAL DATA:  62 year old male with reason chest pain and vomiting. Fluid status.  EXAM: PORTABLE CHEST - 1 VIEW  COMPARISON:  04/22/2013 and earlier.  FINDINGS: Portable AP semi upright view at 2056 hrs. Stable cardiomegaly and mediastinal contours. Previously seen subtle patchy right lung opacity does not persist. No pulmonary edema, pleural effusion, pneumothorax, or consolidation. Stable visualized osseous structures.  IMPRESSION: Interval resolved nonspecific patchy pulmonary opacity. Stable cardiomegaly and no acute cardiopulmonary abnormality.   Electronically Signed   By: Lars Pinks M.D.   On: 04/23/2013 21:09    Cardiac Studies: Tele - nsr Assessment/Plan:  1. Peripheral edema 2. Dyspnea 3. Marked hepatitis 4.  HTN Rec: A confusion picture. His echo does not reveal elevated right heart pressures. Usually when whe see peripheral edema, abdominal distention due to right heart failure, the PA pressures are high and or we see RV dysfunction and enlargement. He has neither. Agree with u/s to rule out obstruction especially of hepatic/portal vein. Will continue diuresis until renal function bumps.  LOS: 2 days    Cristopher Peru, M.D. 04/24/2013, 7:45 PM

## 2013-04-24 NOTE — Progress Notes (Signed)
Interim Progress Note  S: patient complaining of nasal congestion but otherwise denies any symptoms of pain or shortness of breath   O: BP 115/79  Pulse 78  Temp(Src) 97.6 F (36.4 C) (Oral)  Resp 18  Ht 5' 7.5" (1.715 m)  Wt 212 lb 11.9 oz (96.5 kg)  BMI 32.81 kg/m2  SpO2 100% Gen: chronically ill appearing, disheveled, pleasant, difficult to understand Abd: distended, ascites present Extremities: 2+ pretibial edema bilaterally Neuro: oriented to person and date, but no location; mild asterixis present  A/P: 62 year old with acute hepatitis and liver failure of uncertain origin who is hemodynamically stable but displaying signs of hyperammonemia - check ammonia and consider lacutlose as needed - transfer to step-down if becomes unstable  Marena Chancy. Maricela Bo, MD, MBA 04/24/2013, 8:36 PM Family Medicine Resident, PGY-3 [phone] pager

## 2013-04-24 NOTE — Progress Notes (Signed)
FMTS Attending Note Patient seen and examined by me, discussed with resident team and I agree with Dr. Lennie Odor assess/plan for today.  Patient with marked transaminase elevation and INR elevation, as well as elevated serum Creatinine.  I suspect all of these are related to hypoperfusion from hypotension; viral hepatitis panel (HCV; HepBsAg HepBCIgM) negative.  Last HIV test on record from 2012 was negative. Acetaminophen level on this admission is also negative. Elevated Troponin I thought to be related to R sided heart failure; plan for cardiac cath tomorrow morning.  Appreciate further input from gastroenterology regarding patient's transaminitis.  Plan to recheck HIV test.  Dalbert Mayotte, MD

## 2013-04-24 NOTE — Consult Note (Signed)
CARDIOLOGY CONSULT NOTE  Patient ID: [name] [name]  MRN: [id number]  DOB: 1950/12/23  Admit date: 04/22/2013 Date of Consult: 04/24/2013  Primary Physician: Fae Pippin Primary Cardiologist: None  Reason for Consultation: NSTEMI   History of Present Illness: [name] [name] is a 62 y.o. male with a history of HFpEF, COPD, HTN, HL, previously seen by Dr. Percival Spanish as an inpatient consult 2 years ago for CHF, no other follow up, who was admitted with abdominal distention, and new onset liver failure.  See H&P from 04/22/13 for full details.  He was getting fluids initially, then him home lasix was restarted. Tonight he had new onset confusion with low-grade fever and hypotension.  He was cultured and troponin was checked which was elevated.  CXR showed NAD.  His blood pressure improved with fluids.  He denies CP, SOB.  Says his abdominal distention and LE edema started only a couple of weeks ago.     Imaging thus far shows a normal appearing liver on U/S, unremarkable abdominal CT.     Past Medical History  Diagnosis Date  . COPD (chronic obstructive pulmonary disease)   . Gout   . Emphysema   . Hypertension   . Hyperlipidemia   . Tobacco abuse     30+ pack-year history  . Diabetes mellitus without complication   . CHF (congestive heart failure)     Past Surgical History  Procedure Laterality Date  . Appendectomy  as a child     Home Meds: Prior to Admission medications   Medication Sig Start Date End Date Taking? Authorizing Provider  albuterol (PROVENTIL HFA;VENTOLIN HFA) 108 (90 BASE) MCG/ACT inhaler Inhale 2 puffs into the lungs every 4 (four) hours as needed. For shortness of breath    Yes Historical Provider, MD  allopurinol (ZYLOPRIM) 300 MG tablet Take 300 mg by mouth daily.   Yes Historical Provider, MD  aspirin EC 81 MG tablet Take 81 mg by mouth daily.   Yes Historical Provider, MD  b complex vitamins tablet Take 1 tablet by mouth daily.   Yes Historical Provider, MD   carvedilol (COREG) 3.125 MG tablet Take 3.125 mg by mouth 2 (two) times daily with a meal.   Yes Historical Provider, MD  colchicine 0.6 MG tablet Take 0.6-1.2 mg by mouth daily. Take 2 tablets at the onset of gout symptoms, then take 1 tablet in an hour for a max of 3 tablets per 24 hours   Yes Historical Provider, MD  furosemide (LASIX) 40 MG tablet Take 40 mg by mouth daily. Take 1-2 times daily   Yes Historical Provider, MD  lubiprostone (AMITIZA) 24 MCG capsule Take 24 mcg by mouth 2 (two) times daily with a meal.   Yes Historical Provider, MD  metoprolol succinate (TOPROL-XL) 25 MG 24 hr tablet Take 25 mg by mouth daily.   Yes Historical Provider, MD  omeprazole (PRILOSEC) 20 MG capsule Take 20 mg by mouth daily.   Yes Historical Provider, MD  potassium chloride SA (K-DUR,KLOR-CON) 20 MEQ tablet Take 20 mEq by mouth 2 (two) times daily.   Yes Historical Provider, MD  ranolazine (RANEXA) 1000 MG SR tablet Take 1,000 mg by mouth 2 (two) times daily.   Yes Historical Provider, MD  tiotropium (SPIRIVA) 18 MCG inhalation capsule Place 18 mcg into inhaler and inhale daily.   Yes Historical Provider, MD  lisinopril (PRINIVIL,ZESTRIL) 10 MG tablet Take 1 tablet (10 mg total) by mouth daily. 05/20/11 05/19/12  Rosalia Hammers, MD  Current Medications: . aspirin EC  81 mg Oral Daily  . insulin aspart  0-9 Units Subcutaneous TID WC  . levofloxacin (LEVAQUIN) IV  750 mg Intravenous Q24H  . metoprolol succinate  25 mg Oral Daily  . nicotine  7 mg Transdermal Daily  . oxymetazoline  1 spray Each Nare BID  . pantoprazole  40 mg Oral Daily  . ranolazine  1,000 mg Oral BID  . sodium chloride  3 mL Intravenous Q12H  . tiotropium  18 mcg Inhalation Daily     Allergies:    Allergies  Allergen Reactions  . Penicillins Other (See Comments)    Swelling , long time ago.     Social History:   The patient  reports that he quit smoking about 1 years ago. He does not have any smokeless tobacco history  on file. He reports that he does not drink alcohol or use illicit drugs.    Family History:   The patient's family history includes Diabetes in his mother; Heart attack in an other family member; Hypertension in an other family member.   ROS:  Please see the history of present illness.   All other systems reviewed and negative.   Vital Signs: Blood pressure 114/74, pulse 81, temperature 97.3 F (36.3 C), temperature source Oral, resp. rate 19, height 5' 7.5" (1.715 m), weight 96.8 kg (213 lb 6.5 oz), SpO2 99.00%.   PHYSICAL EXAM: General:  Well nourished, well developed, in no acute distress HEENT: normal Lymph: no adenopathy Neck: Elevated to >12cm with large V waves Endocrine:  No thryomegaly Vascular: No carotid bruits; FA pulses 2+ bilaterally without bruits Cardiac:  normal S1, S2.  No RV heave noted.  Laterally displaced PMI Lungs:  clear to auscultation bilaterally, no wheezing, rhonchi or rales Abd: distended, soft, nt. Ext: 1+ b/l LE edema Musculoskeletal:  No deformities, BUE and BLE strength normal and equal Skin: warm and dry Neuro:  CNs 2-12 intact, no focal abnormalities noted Psych:  Normal affect   EKG:  NSR, RBBB  Labs:  Recent Labs  04/23/13 2127  TROPONINI 1.11*   Lab Results  Component Value Date   WBC 11.3* 04/23/2013   HGB 12.8* 04/23/2013   HCT 40.6 04/23/2013   MCV 78.8 04/23/2013   PLT 228 04/23/2013    Recent Labs Lab 04/23/13 2127  NA 129*  K 5.0  CL 94*  CO2 16*  BUN 35*  CREATININE 1.77*  CALCIUM 9.4  PROT 7.4  BILITOT 4.1*  ALKPHOS 88  ALT 1196*  AST 1265*  GLUCOSE 109*   No results found for this basename: CHOL, HDL, LDLCALC, TRIG   No results found for this basename: DDIMER   BNP    Component Value Date/Time   PROBNP 2616.0* 04/22/2013 1359      Radiology/Studies:  Ct Abdomen Pelvis Wo Contrast  04/22/2013   CLINICAL DATA:  Abdominal pain and distention.  EXAM: CT ABDOMEN AND PELVIS WITHOUT CONTRAST  TECHNIQUE:  Multidetector CT imaging of the abdomen and pelvis was performed following the standard protocol without intravenous contrast.  COMPARISON:  04/21/2013  FINDINGS: Mild ascites again seen within the abdomen and pelvis, without significant change. The liver, gallbladder, spleen, pancreas, adrenal glands, and kidneys are unremarkable in appearance except for small right renal cyst. Vicarious excretion of contrast noted in the gallbladder as well as mild residual contrast enhancement of both kidneys from the most recent CT. No soft tissue masses or lymphadenopathy identified within the abdomen or pelvis. Residual  contrast seen in the urinary bladder.  Diverticulosis is seen mainly involving the sigmoid colon, however there is no evidence of diverticulitis. No focal inflammatory process or abscess identified. No evidence of bowel obstruction or hernia.  IMPRESSION: Mild ascites, without significant change since prior exam.  Diverticulosis. No radiographic evidence of diverticulitis.   Electronically Signed   By: Earle Gell M.D.   On: 04/22/2013 20:39   Dg Chest 2 View  04/22/2013   CLINICAL DATA:  Chest pain and vomiting  EXAM: CHEST  2 VIEW  COMPARISON:  04/21/2013  FINDINGS: The heart is again enlarged in size but stable. The lungs are well aerated bilaterally. Some very mild patchy changes are noted in the right upper lobe which may represent some early infiltrate.  IMPRESSION: Stable cardiomegaly.  New patchy changes in the right upper lobe which may represent an early infiltrate.   Electronically Signed   By: Inez Catalina M.D.   On: 04/22/2013 14:51   US Abdomen Complete  04/22/2013   CLINICAL DATA:  Abdominal pain.  EXAM: ULTRASOUND ABDOMEN COMPLETE  COMPARISON:  None.  FINDINGS: Gallbladder  No gallstones identified. Mild diffuse gallbladder wall thickening seen measuring 4 mm, however no sonographic Murphy sign was noted.  Common bile duct  Diameter: 3 mm  Liver  No focal lesion identified. Within normal  limits in parenchymal echogenicity. Mild perihepatic ascites noted.  IVC  No abnormality visualized.  Pancreas  Visualized portion unremarkable.  Spleen  Size and appearance within normal limits.  Right Kidney  Length: 10.3 cm. Echogenicity within normal limits. No mass or hydronephrosis visualized.  Left Kidney  Length: 10.4 cm. Echogenicity within normal limits. No mass or hydronephrosis visualized.  Abdominal aorta  No aneurysm visualized.  IMPRESSION: No evidence of gallstones or biliary dilatation.  Mild diffuse gallbladder wall thickening, which is a nonspecific finding. Mild ascites also noted.  Unremarkable sonographic appearance of the liver.   Electronically Signed   By: Earle Gell M.D.   On: 04/22/2013 16:46   Dg Chest Port 1 View  04/23/2013   CLINICAL DATA:  62 year old male with reason chest pain and vomiting. Fluid status.  EXAM: PORTABLE CHEST - 1 VIEW  COMPARISON:  04/22/2013 and earlier.  FINDINGS: Portable AP semi upright view at 2056 hrs. Stable cardiomegaly and mediastinal contours. Previously seen subtle patchy right lung opacity does not persist. No pulmonary edema, pleural effusion, pneumothorax, or consolidation. Stable visualized osseous structures.  IMPRESSION: Interval resolved nonspecific patchy pulmonary opacity. Stable cardiomegaly and no acute cardiopulmonary abnormality.   Electronically Signed   By: Lars Pinks M.D.   On: 04/23/2013 21:09    ASSESSMENT AND PLAN:  1. Right Sided Heart Failure - Mr. Chrestman has a very elevated JVP, and his liver and renal failure are likely explained by right sided congestion.  Lungs are clear on CXR making his R sided failure out of proportion to any left sided failure. - Echocardiogram tomorrow morning to further evaluate. - I would say to try and diurese him, but since his hypotension earlier responded to fluid and his BP is stable now, lets leave him where he is until echo.  2. NSTEMI - likely in the setting of above rather than acute  coronary event. - Given his elevated INR from liver failure would hold on anticoagulation for now. - aspirin is ok - again, echo in the morning.   Cleotis Lema 04/24/2013 2:13 AM

## 2013-04-25 DIAGNOSIS — J984 Other disorders of lung: Secondary | ICD-10-CM

## 2013-04-25 DIAGNOSIS — I369 Nonrheumatic tricuspid valve disorder, unspecified: Secondary | ICD-10-CM

## 2013-04-25 LAB — LACTIC ACID, PLASMA: Lactic Acid, Venous: 3.7 mmol/L — ABNORMAL HIGH (ref 0.5–2.2)

## 2013-04-25 LAB — CBC
Hemoglobin: 10.7 g/dL — ABNORMAL LOW (ref 13.0–17.0)
MCV: 76.9 fL — ABNORMAL LOW (ref 78.0–100.0)
Platelets: 176 10*3/uL (ref 150–400)
RBC: 4.38 MIL/uL (ref 4.22–5.81)
RDW: 20.6 % — ABNORMAL HIGH (ref 11.5–15.5)
WBC: 11.3 10*3/uL — ABNORMAL HIGH (ref 4.0–10.5)

## 2013-04-25 LAB — BASIC METABOLIC PANEL
BUN: 41 mg/dL — ABNORMAL HIGH (ref 6–23)
Calcium: 9 mg/dL (ref 8.4–10.5)
Chloride: 99 mEq/L (ref 96–112)
GFR calc Af Amer: 51 mL/min — ABNORMAL LOW (ref >90)
GFR calc non Af Amer: 44 mL/min — ABNORMAL LOW (ref >90)
Glucose, Bld: 123 mg/dL — ABNORMAL HIGH (ref 70–99)
Potassium: 4.4 mEq/L (ref 3.5–5.1)

## 2013-04-25 LAB — COMPREHENSIVE METABOLIC PANEL
Albumin: 3.1 g/dL — ABNORMAL LOW (ref 3.5–5.2)
Alkaline Phosphatase: 79 U/L (ref 39–117)
BUN: 41 mg/dL — ABNORMAL HIGH (ref 6–23)
CO2: 19 mEq/L (ref 19–32)
Chloride: 101 mEq/L (ref 96–112)
Creatinine, Ser: 1.6 mg/dL — ABNORMAL HIGH (ref 0.50–1.35)
GFR calc non Af Amer: 45 mL/min — ABNORMAL LOW (ref >90)
Glucose, Bld: 100 mg/dL — ABNORMAL HIGH (ref 70–99)
Potassium: 4.6 mEq/L (ref 3.5–5.1)
Total Bilirubin: 4 mg/dL — ABNORMAL HIGH (ref 0.3–1.2)

## 2013-04-25 LAB — URINE CULTURE
Colony Count: NO GROWTH
Culture: NO GROWTH

## 2013-04-25 LAB — ANTI-SMOOTH MUSCLE ANTIBODY, IGG: F-Actin IgG: 10 U (ref <20)

## 2013-04-25 LAB — AFP TUMOR MARKER: AFP-Tumor Marker: 1.6 ng/mL (ref 0.0–8.0)

## 2013-04-25 LAB — PATHOLOGIST SMEAR REVIEW

## 2013-04-25 LAB — PROTIME-INR: Prothrombin Time: 33.5 seconds — ABNORMAL HIGH (ref 11.6–15.2)

## 2013-04-25 MED ORDER — FUROSEMIDE 10 MG/ML IJ SOLN: 40.0000 mg | Freq: Two times a day (BID) | INTRAMUSCULAR | Status: DC

## 2013-04-25 MED ORDER — FUROSEMIDE 10 MG/ML IJ SOLN
40.0000 mg | Freq: Two times a day (BID) | INTRAMUSCULAR | Status: DC
  Administered 2013-04-25 – 2013-04-30 (×10): 40 mg via INTRAVENOUS
  Filled 2013-04-25 (×13): qty 4

## 2013-04-25 NOTE — Progress Notes (Signed)
Patient ID: [name] [name], male   DOB: 1950/08/25, 62 y.o.   MRN: [id number]    SUBJECTIVE: Alert this morning, seems mildly confused.  Denies dyspnea.  Creatinine stable.  LFTs remain elevated.   Marland Kitchen aspirin EC  81 mg Oral Daily  . furosemide  40 mg Intravenous BID  . levofloxacin (LEVAQUIN) IV  750 mg Intravenous Q48H  . metoprolol succinate  25 mg Oral Daily  . nicotine  7 mg Transdermal Daily  . oxymetazoline  1 spray Each Nare BID  . pantoprazole  40 mg Oral Daily  . ranolazine  1,000 mg Oral BID  . sodium chloride  3 mL Intravenous Q12H  . tiotropium  18 mcg Inhalation Daily  . vancomycin  750 mg Intravenous Q12H     Filed Vitals:   04/24/13 2032 04/25/13 0125 04/25/13 0413 04/25/13 0540  BP: 109/64 110/72 119/85   Pulse: 77 79 80   Temp: 97.6 F (36.4 C) 98.2 F (36.8 C) 97.6 F (36.4 C)   TempSrc: Oral Oral Oral   Resp: 18 20 20    Height:      Weight:    96.435 kg (212 lb 9.6 oz)  SpO2: 99% 99% 100%     Intake/Output Summary (Last 24 hours) at 04/25/13 0837 Last data filed at 04/25/13 0731  Gross per 24 hour  Intake   1090 ml  Output   2725 ml  Net  -1635 ml    LABS: Basic Metabolic Panel:  Recent Labs  04/24/13 1650 04/25/13 0512  NA 130* 134*  K 4.7 4.6  CL 98 101  CO2 18* 19  GLUCOSE 91 100*  BUN 40* 41*  CREATININE 1.65* 1.60*  CALCIUM 9.3 9.2   Liver Function Tests:  Recent Labs  04/24/13 0217 04/25/13 0512  AST 1454* 1153*  ALT 1220* 1247*  ALKPHOS 76 79  BILITOT 4.2* 4.0*  PROT 6.7 6.6  ALBUMIN 3.2* 3.1*    Recent Labs  04/22/13 1735  LIPASE 19   CBC:  Recent Labs  04/22/13 1359  04/23/13 2127 04/24/13 0217 04/25/13 0512  WBC 11.0*  < > 11.3* 13.2* 11.3*  NEUTROABS 8.6*  --  8.8*  --   --   HGB 11.3*  < > 12.8* 11.6* 10.7*  HCT 35.4*  < > 40.6 36.2* 33.7*  MCV 80.3  < > 78.8 78.2 76.9*  PLT 237  < > 228 206 176  < > = values in this interval not displayed. Cardiac Enzymes:  Recent Labs  04/23/13 2127  04/24/13 0217 04/24/13 1000  TROPONINI 1.11* <0.30 0.31*   BNP: No components found with this basename: POCBNP,  D-Dimer: No results found for this basename: DDIMER,  in the last 72 hours Hemoglobin A1C:  Recent Labs  04/23/13 0125  HGBA1C 5.7*   Fasting Lipid Panel: No results found for this basename: CHOL, HDL, LDLCALC, TRIG, CHOLHDL, LDLDIRECT,  in the last 72 hours Thyroid Function Tests: No results found for this basename: TSH, T4TOTAL, FREET3, T3FREE, THYROIDAB,  in the last 72 hours Anemia Panel:  Recent Labs  04/24/13 1900  FERRITIN 82  TIBC 288  IRON 13*    RADIOLOGY: Ct Abdomen Pelvis Wo Contrast  04/22/2013   CLINICAL DATA:  Abdominal pain and distention.  EXAM: CT ABDOMEN AND PELVIS WITHOUT CONTRAST  TECHNIQUE: Multidetector CT imaging of the abdomen and pelvis was performed following the standard protocol without intravenous contrast.  COMPARISON:  04/21/2013  FINDINGS: Mild ascites again seen within the abdomen  and pelvis, without significant change. The liver, gallbladder, spleen, pancreas, adrenal glands, and kidneys are unremarkable in appearance except for small right renal cyst. Vicarious excretion of contrast noted in the gallbladder as well as mild residual contrast enhancement of both kidneys from the most recent CT. No soft tissue masses or lymphadenopathy identified within the abdomen or pelvis. Residual contrast seen in the urinary bladder.  Diverticulosis is seen mainly involving the sigmoid colon, however there is no evidence of diverticulitis. No focal inflammatory process or abscess identified. No evidence of bowel obstruction or hernia.  IMPRESSION: Mild ascites, without significant change since prior exam.  Diverticulosis. No radiographic evidence of diverticulitis.   Electronically Signed   By: Earle Gell M.D.   On: 04/22/2013 20:39   Dg Chest 2 View  04/22/2013   CLINICAL DATA:  Chest pain and vomiting  EXAM: CHEST  2 VIEW  COMPARISON:  04/21/2013   FINDINGS: The heart is again enlarged in size but stable. The lungs are well aerated bilaterally. Some very mild patchy changes are noted in the right upper lobe which may represent some early infiltrate.  IMPRESSION: Stable cardiomegaly.  New patchy changes in the right upper lobe which may represent an early infiltrate.   Electronically Signed   By: Inez Catalina M.D.   On: 04/22/2013 14:51   US Abdomen Complete  04/22/2013   CLINICAL DATA:  Abdominal pain.  EXAM: ULTRASOUND ABDOMEN COMPLETE  COMPARISON:  None.  FINDINGS: Gallbladder  No gallstones identified. Mild diffuse gallbladder wall thickening seen measuring 4 mm, however no sonographic Murphy sign was noted.  Common bile duct  Diameter: 3 mm  Liver  No focal lesion identified. Within normal limits in parenchymal echogenicity. Mild perihepatic ascites noted.  IVC  No abnormality visualized.  Pancreas  Visualized portion unremarkable.  Spleen  Size and appearance within normal limits.  Right Kidney  Length: 10.3 cm. Echogenicity within normal limits. No mass or hydronephrosis visualized.  Left Kidney  Length: 10.4 cm. Echogenicity within normal limits. No mass or hydronephrosis visualized.  Abdominal aorta  No aneurysm visualized.  IMPRESSION: No evidence of gallstones or biliary dilatation.  Mild diffuse gallbladder wall thickening, which is a nonspecific finding. Mild ascites also noted.  Unremarkable sonographic appearance of the liver.   Electronically Signed   By: Earle Gell M.D.   On: 04/22/2013 16:46   Dg Chest Port 1 View  04/23/2013   CLINICAL DATA:  62 year old male with reason chest pain and vomiting. Fluid status.  EXAM: PORTABLE CHEST - 1 VIEW  COMPARISON:  04/22/2013 and earlier.  FINDINGS: Portable AP semi upright view at 2056 hrs. Stable cardiomegaly and mediastinal contours. Previously seen subtle patchy right lung opacity does not persist. No pulmonary edema, pleural effusion, pneumothorax, or consolidation. Stable visualized osseous  structures.  IMPRESSION: Interval resolved nonspecific patchy pulmonary opacity. Stable cardiomegaly and no acute cardiopulmonary abnormality.   Electronically Signed   By: Lars Pinks M.D.   On: 04/23/2013 21:09    PHYSICAL EXAM General: NAD Neck: JVP 12-14 cm with prominent V wave, no thyromegaly or thyroid nodule.  Lungs: Decreased breath sounds at bases. CV: Nondisplaced PMI.  Heart regular S1/S2, no S3/S4, 2/6 HSM LLSB.  1+ edema to knees bilaterally.  No carotid bruit.   Abdomen: Soft, nontender, no hepatosplenomegaly, moderate distention.  Neurologic: Alert with mild confusion.  Psych: Normal affect. Extremities: No clubbing or cyanosis.   TELEMETRY: Reviewed telemetry pt in NSR  ASSESSMENT AND PLAN: 62 yo with history  of COPD, diastolic CHF, and HTN presented with abdominal distention and liver failure.  He also has evidence for right-sided heart failure.  1. Liver failure: Patient has liver failure with prominently elevated transaminases and elevated INR.  He does have evidence on exam for right-sided heart failure with markedly high JVP.  His degree of liver dysfunction seems out of proportion to what you would expect from right-sided CHF.  He was mildly hypotensive briefly in the hospital but did not seem enough to cause this degree of liver dysfunction as shock liver.  Agree with ongoing search for cause of liver dysfunction.  It may end up being shock liver + right-sided heart failure but would rule out other etiologies.  2. CHF: Patient has prominent JVD and a TR murmur.  There is definite right-sided CHF on exam, echo not yet done to confirm RV and LV function.  He was seen in this hospital for diastolic CHF back in [id number].  Apparently he is seeing a cardiologist in [place] who put him on Ranexa but he does not know why this was done.  He says he has never had a cardiac catheterization.  Creatinine is stable.  - Lasix 40 mg IV bid today.  - Await echo - Will probably need right heart  cath prior to discharge  - Would stop Ranexa for now with liver failure.  Would try to get records from cardiologist in [place].  3. AKI on probable CKD.  Creatinine has been stable.  Will diurese today.   Loralie Champagne 04/25/2013 8:50 AM

## 2013-04-25 NOTE — Progress Notes (Signed)
Patient seen, examined, and I agree with the above documentation, including the assessment and plan. Pt with continued marked elevation in LFTs.  ANA is negative, no evidence of iron overload (iron studies show mixed picture likely chronic disease).   Liver doppler still pending INR remains quite elevated, but not continuing to increase (did trend down slightly). Cardiology following for what is felt to be right heart failure, but I agree that LFT elevation is above what one would expect for heart failure alone.  Shock liver remains possible as a component. Minimize non-essential meds which could be hepatotoxic. Will follow, trend LFTs, INR

## 2013-04-25 NOTE — Progress Notes (Signed)
FMTS Attending Note Patient seen and examined by me today, discussed with resident team and I agree with Dr Lisbeth Ply assessment and plan. Dalbert Mayotte, MD

## 2013-04-25 NOTE — Progress Notes (Signed)
Scheduled IV Lasix 40 mg and IV Vancomycin were due at 1800 but no IV access due to leaking IV that I D/C'd, IV team started new IV site but site not patent, Receiving RN and Charge Nurse followed up and also were not able to flush IV, IV medications held due to no patent IV site. Receiving RN, MD, and charge Nurse were made aware.

## 2013-04-25 NOTE — Progress Notes (Signed)
Co-signed for [employer] RN/[name] for assessments, IV assessments, medication administration, progress notes, patient education, I's and O's, and vital signs. Tonny Branch, RN/[name]

## 2013-04-25 NOTE — Progress Notes (Signed)
Foley removed. Patient tolerated it well.

## 2013-04-25 NOTE — Progress Notes (Signed)
[facility] Daily Rounding Note 04/25/2013, 9:47 AM  LOS: 3 days   SUBJECTIVE:       No nausea.  Not SOB.  Denies any pain.  Is quite weak and requires at least 2 person assist for transfer back to bed from chair today.  + weak and fatigue.   OBJECTIVE:         Vital signs in last 24 hours:    Temp:  [97.3 F (36.3 C)-98.2 F (36.8 C)] 97.6 F (36.4 C) (11/04 0413) Pulse Rate:  [77-82] 80 (11/04 0413) Resp:  [18-20] 20 (11/04 0413) BP: (109-119)/(64-87) 119/85 mmHg (11/04 0413) SpO2:  [99 %-100 %] 99 % (11/04 0848) Weight:  [96.435 kg (212 lb 9.6 oz)] 96.435 kg (212 lb 9.6 oz) (11/04 0540) Last BM Date: 04/23/13 General:  Weak, looks chronically ill   Heart: RRR Chest: clear Abdomen: soft, moderately protuberant.  + BS.  Not tender  Extremities: + LE edema Neuro/Psych:  Somewhat slow response time but appropriate and oriented  Intake/Output from previous day: 11/03 0701 - 11/04 0700 In: 1330 [P.O.:1180; IV Piggyback:150] Out: 2075 [Urine:2075]  Intake/Output this shift: Total I/O In: 240 [P.O.:240] Out: 750 [Urine:750]  Lab Results:  Recent Labs  04/23/13 2127 04/24/13 0217 04/25/13 0512  WBC 11.3* 13.2* 11.3*  HGB 12.8* 11.6* 10.7*  HCT 40.6 36.2* 33.7*  PLT 228 206 176  MCV      76 BMET  Recent Labs  04/24/13 0217 04/24/13 1650 04/25/13 0512  NA 132* 130* 134*  K 5.1 4.7 4.6  CL 98 98 101  CO2 16* 18* 19  GLUCOSE 98 91 100*  BUN 37* 40* 41*  CREATININE 1.80* 1.65* 1.60*  CALCIUM 9.2 9.3 9.2   LFT  Recent Labs  04/23/13 0603 04/23/13 1210 04/23/13 2127 04/24/13 0217 04/25/13 0512  PROT 6.5  --  7.4 6.7 6.6  ALBUMIN 3.0*  --  3.5 3.2* 3.1*  AST 1311*  --  1265* 1454* 1153*  ALT 951*  --  1196* 1220* 1247*  ALKPHOS 68  --  88 76 79  BILITOT 3.2* 3.6* 4.1* 4.2* 4.0*  BILIDIR  --  2.1*  --   --   --   IBILI  --  1.5*  --   --   --    PT/INR  Recent Labs  04/24/13 1000 04/25/13 0512  LABPROT 35.4* 33.5*  INR 3.71* 3.46*    Hepatitis Panel  Recent Labs  04/22/13 1735  HEPBSAG NEGATIVE  HCVAB NEGATIVE  HEPAIGM NON REACTIVE  HEPBIGM NON REACTIVE    Studies/Results: Dg Chest Port 1 View  04/23/2013   CLINICAL DATA:  62 year old male with reason chest pain and vomiting. Fluid status.  EXAM: PORTABLE CHEST - 1 VIEW  COMPARISON:  04/22/2013 and earlier.  FINDINGS: Portable AP semi upright view at 2056 hrs. Stable cardiomegaly and mediastinal contours. Previously seen subtle patchy right lung opacity does not persist. No pulmonary edema, pleural effusion, pneumothorax, or consolidation. Stable visualized osseous structures.  IMPRESSION: Interval resolved nonspecific patchy pulmonary opacity. Stable cardiomegaly and no acute cardiopulmonary abnormality.   Electronically Signed   By: Lars Pinks M.D.   On: 04/23/2013 21:09    ASSESMENT:    * Recurrent acute elevation of LFTs. Similar to 12/2012 when acutely ill with Rhabdo.  ? sequelae of right heart failure, shock liver?  Transaminases, T bili improving.  Total Hep B core + 12/2012, Hep B core IgM negative 04/22/13. No CT or  ultrasound evidence of liver disease.  ANA negative. Ferritin 82 (not elevated or depressed)  Echocardiogram and liver doppler studies pending *  Coagulopathy. c/w hepatic dysfunction. Improved.  * Acute renal failure.  Persists and stable but overall improved.  *  Iron deficiency anemia * CHF, primarily right sided.  No congestion on CXR  *  Community acquired PNA?Marland Kitchen  On Vanc, Levaquin. Patchy opacity of right lung resolved.  * Type 2 DM. A1C is 5.7:  Good control  * Constipation treated with Amitiza prn at home. Treated with Vanco po for C diff in 12/2012.    PLAN   *   Pending: IgG, ASMA, ceruloplasmin, alpha one, AMA. Liver doppler studies to r/o PV or HV clot. Echocardiogram.  *  Will need to begin po Iron, this is likely to worsen chronic constipation.  Await Echo report.  *  Does he still need ABX? No PNA, no UTI, no fever.       Azucena Freed  04/25/2013, 9:47 AM Pager: [phone]

## 2013-04-25 NOTE — Progress Notes (Signed)
  Echocardiogram 2D Echocardiogram has been performed.  [name] [name] 04/25/2013, 11:30 AM

## 2013-04-25 NOTE — Evaluation (Signed)
Physical Therapy Evaluation Patient Details Name: [name] [name] MRN: [id number] DOB: 07/20/1950 Today's Date: 04/25/2013 Time: [id number] PT Time Calculation (min): 23 min  PT Assessment / Plan / Recommendation History of Present Illness  [name] [name] is a 62 y.o. male presenting with abdominal discomfort and distention, found to have AKI and markedly elevated LFT's . PMH is significant for CHF, COPD, HTN, and tobacco abuse  Clinical Impression  Pt with below deficits impairing function who will benefit from acute therapy to maximize mobility, strength and function to decrease burden of care. Recommend daily mobility with nursing assist and use of RW to assist with trunk extension.     PT Assessment  Patient needs continued PT services    Follow Up Recommendations  SNF;Supervision/Assistance - 24 hour    Does the patient have the potential to tolerate intense rehabilitation      Barriers to Discharge Decreased caregiver support      Equipment Recommendations  None recommended by PT    Recommendations for Other Services OT consult   Frequency Min 3X/week    Precautions / Restrictions Precautions Precautions: Fall   Pertinent Vitals/Pain No pain     Mobility  Bed Mobility Bed Mobility: Supine to Sit;Sitting - Scoot to Edge of Bed Supine to Sit: 3: Mod assist;HOB flat;With rails Sitting - Scoot to Edge of Bed: 4: Min assist Details for Bed Mobility Assistance: cueing for sequence with assist to elevate trunk from surface Transfers Transfers: Sit to Stand;Stand to Sit;Stand Pivot Transfers Sit to Stand: From bed;From chair/3-in-1;3: Mod assist Stand to Sit: To chair/3-in-1;To bed;4: Min assist Stand Pivot Transfers: 3: Mod assist Details for Transfer Assistance: pt stood from bed x2 then from chair. Initial stand from bed without AD and pt would not achieve full trunk extension due to keeping one hand firmly on the bed despite cueing. With Rw placed in front pt able to bring both  hands to AD and stand however still maintained flexed trunk despite cues. pt pivoted to chair toward right with assist to guide hips and control descent Ambulation/Gait Ambulation/Gait Assistance: 4: Min assist Ambulation Distance (Feet): 8 Feet Assistive device: Rolling walker Ambulation/Gait Assistance Details: max cueing to step into RW, extend trunk, look up and assist to direct and control RW Gait Pattern: Shuffle;Trunk flexed Gait velocity: decreased Stairs: No    Exercises     PT Diagnosis: Difficulty walking;Generalized weakness  PT Problem List: Decreased strength;Decreased range of motion;Decreased cognition;Decreased knowledge of use of DME;Decreased activity tolerance;Decreased balance;Decreased mobility PT Treatment Interventions: Gait training;DME instruction;Functional mobility training;Therapeutic activities;Therapeutic exercise;Patient/family education;Balance training     PT Goals(Current goals can be found in the care plan section) Acute Rehab PT Goals Patient Stated Goal: be able to return home PT Goal Formulation: With patient Time For Goal Achievement: 05/09/13 Potential to Achieve Goals: Fair  Visit Information  Last PT Received On: 04/25/13 Assistance Needed: +1 History of Present Illness: [name] [name] is a 62 y.o. male presenting with abdominal discomfort and distention, found to have AKI and markedly elevated LFT's . PMH is significant for CHF, COPD, HTN, and tobacco abuse       Prior Functioning  Home Living Family/patient expects to be discharged to:: Private residence Living Arrangements: Spouse/significant other Available Help at Discharge: Family;Available PRN/intermittently Type of Home: Apartment Home Access: Level entry Home Layout: One level Home Equipment: Walker - 2 wheels;Cane - single point Prior Function Level of Independence: Independent Comments: pt states he was able to perform all ADLs and only  used the cane when he has gout flares.  Pt also reports spending 3 months in rehab and being home a month but unable to say where Communication Communication: Expressive difficulties    Cognition  Cognition Arousal/Alertness: Awake/alert Behavior During Therapy: WFL for tasks assessed/performed Overall Cognitive Status: Impaired/Different from baseline Area of Impairment: Orientation Orientation Level: Disoriented to;Time;Place Memory: Decreased short-term memory    Extremity/Trunk Assessment Upper Extremity Assessment Upper Extremity Assessment: Generalized weakness Lower Extremity Assessment Lower Extremity Assessment: Generalized weakness Cervical / Trunk Assessment Cervical / Trunk Assessment: Normal   Balance Balance Balance Assessed: Yes Static Sitting Balance Static Sitting - Balance Support: Feet supported;Bilateral upper extremity supported Static Sitting - Level of Assistance: 5: Stand by assistance Static Sitting - Comment/# of Minutes: 5 Static Standing Balance Static Standing - Balance Support: Bilateral upper extremity supported Static Standing - Level of Assistance: 4: Min assist Static Standing - Comment/# of Minutes: 2  End of Session PT - End of Session Equipment Utilized During Treatment: Gait belt Activity Tolerance: Patient tolerated treatment well Patient left: in chair;with call bell/phone within reach;with nursing/sitter in room Nurse Communication: Mobility status  GP     Melford Aase 04/25/2013, 2:26 PM Elwyn Reach, [place]

## 2013-04-25 NOTE — Progress Notes (Signed)
IV team able to place IV in left wrist. IV RN states IV will not last until AM and pt may still need PICC. MD on call notified. Will continue to monitor. Shelby Mattocks, RN

## 2013-04-25 NOTE — Progress Notes (Signed)
This AM pt states "I had chest pain". Pt states he is not having chest pain right now. Pt with no shortness of breath right now. Pt states it "lasted a minute maybe 45 minutes ago". Pt Alert to self, place, and time. Pt VSS and pt in NAD. MD on call notified and ordered to get EKG. EKG performed, states pt hear rhythm-sinus rhythm, right bundle branch block. Will continue to monitor pt. Shelby Mattocks, RN

## 2013-04-25 NOTE — Progress Notes (Signed)
Pt IV infiltrated. MD on call notified need for possible PICC, MD states to call IV team to try PIV again, then call MD. Shelby Mattocks, RN

## 2013-04-25 NOTE — Progress Notes (Signed)
Family Medicine Teaching Service Daily Progress Note Intern Pager: [phone]  Patient name: [name] [name] Medical record number: [id number] Date of birth: 11/02/1950 Age: 62 y.o. Gender: male  Primary Care Provider: Fae Pippin Consultants: none Code Status: Full  Pt Overview and Major Events to Date:  04/23/13: Increased LFT's from previous day  Assessment and Plan: Taurin Guilfoyle is a 62 y.o. male presenting with abdominal discomfort and distention, found to have AKI and markedly elevated LFT's . PMH is significant for CHF, COPD, HTN, and tobacco abuse.   # Transaminitis with elevated bilirubin, mild ascites: repeat LFT's today stable with AST/ALT: 1153/1247 and tbili of 4.0. Unclear etiology. Differential: shock liver vs acute hepatitis vs RHF vs toxicity vs malignancy. Ammonia elevated from previously at 55 from 36, but still WNL - abd u/s and CT abdomen essentially unremarkable  - acute hepatitis panel negative, tylenol level neg - repeat CMP in am - hold home colchicine, amitiza and allopurinol - INR elevated >2 without any anticoagulation, recheck coags today - Ferritin WNL - follow-up alpha fetoprotein, ANA, IgG, ASMA, ceruloplasmin, AMA per GI recommendations - follow-up hepatic ultrasound to assess hepatic vein and portal vein for clots - follow-up daily INR  # AKI, metabolic acidosis, elevated lactic acid: Acidosis has resolved. lactic acid increasing (3.7 from 3.1) . AG of 12. Unclear whether Cr is at baseline, since we do not have recent records.  - continue to hold home lisinopril - will try obtaining records from [facility] where he was apparently hospitalized  # SOB: PNA vs CHF exacerbation, CXR with RUL infiltrate.  - continue levaquin q48 hours for HCAP (Day #4) - continue vancomycin to broaden coverage (Day #2) - would be helpful to find out dry weight - need to call PCP - strict I&O, daily weights  - albuterol, spiriva, incentive spirometry  - continue home  toprol XL, hold home carvedilol - continue home ranolazine  - continue to monitor on telemetry  - follow-up echo today - restart lasix per cardiology recommendations  # Right foot wound: being managed by wound care as an outpatient.  - wound care in hospital  # Right leg swelling and erythema: appears to be resolved  # Diarrhea:  - C. diff lab d/c'd by GI  # Diabetes: pt reports he has DM, not on any medications per med list. CBG's in 70's-100s  # Reported hematuria: urine not grossly infected on UA. Likely change in color from bilirubin - follow-up urine culture   # Gout: hold colchicine, hold allopurinol  # Tobacco abuse: will provide nicotine patch per pt request   FEN/GI: heart healthy diet, NS @ 75cc/hr Prophylaxis: heparin SQ   Disposition: pending recommendations from GI and cardiology   Subjective:  Difficult for me to understand but his only concern seemed to be feeling a little more fatigued than usual. Breathing well with nasal cannula.  Objective: Temp:  [97.3 F (36.3 C)-98.2 F (36.8 C)] 97.6 F (36.4 C) (11/04 0413) Pulse Rate:  [77-82] 80 (11/04 0413) Resp:  [18-20] 20 (11/04 0413) BP: (109-123)/(64-87) 119/85 mmHg (11/04 0413) SpO2:  [98 %-100 %] 100 % (11/04 0413) Weight:  [96.435 kg (212 lb 9.6 oz)-96.5 kg (212 lb 11.9 oz)] 96.435 kg (212 lb 9.6 oz) (11/04 0540)  Physical Exam: General: no acute distress, sitting up in chair, difficult for me to understand his speech HEENT: poor dentition Cardiovascular: S1S2, RRR, no murmur Respiratory: CTA, normal work of breathing, no crackles appreciated Abdomen: soft, non tender, distended, +  BS Extremities: Non-erythematous bilaterally, legs equal in warmth, no tenderness, mild edema bilaterally Neuro: follows commands, seems to be oriented but speech is difficult for me to understand  Laboratory:  Recent Labs Lab 04/23/13 2127 04/24/13 0217 04/25/13 0512  WBC 11.3* 13.2* 11.3*  HGB 12.8* 11.6*  10.7*  HCT 40.6 36.2* 33.7*  PLT 228 206 176    Recent Labs Lab 04/23/13 2127 04/24/13 0217 04/24/13 1650 04/25/13 0512  NA 129* 132* 130* 134*  K 5.0 5.1 4.7 4.6  CL 94* 98 98 101  CO2 16* 16* 18* 19  BUN 35* 37* 40* 41*  CREATININE 1.77* 1.80* 1.65* 1.60*  CALCIUM 9.4 9.2 9.3 9.2  PROT 7.4 6.7  --  6.6  BILITOT 4.1* 4.2*  --  4.0*  ALKPHOS 88 76  --  79  ALT 1196* 1220*  --  PENDING  AST 1265* 1454*  --  PENDING  GLUCOSE 109* 98 91 100*     04/25/2013 05:15  Lactic Acid, Venous 3.7 (H)     04/23/2013 12:10 04/24/2013 10:00 04/25/2013 05:12  INR 2.77 (H) 3.71 (H) 3.46 (H)     04/23/2013 12:10 04/24/2013 10:00 04/25/2013 05:12  Prothrombin Time 28.3 (H) 35.4 (H) 33.5 (H)    Imaging/Diagnostic Tests: No recent imaging  Medications: Scheduled Meds: . aspirin EC  81 mg Oral Daily  . levofloxacin (LEVAQUIN) IV  750 mg Intravenous Q48H  . metoprolol succinate  25 mg Oral Daily  . nicotine  7 mg Transdermal Daily  . oxymetazoline  1 spray Each Nare BID  . pantoprazole  40 mg Oral Daily  . ranolazine  1,000 mg Oral BID  . sodium chloride  3 mL Intravenous Q12H  . tiotropium  18 mcg Inhalation Daily  . vancomycin  750 mg Intravenous Q12H   Continuous Infusions:  PRN Meds:.albuterol, ondansetron (ZOFRAN) IV, ondansetron, polyethylene glycol   Cordelia Poche, MD 04/25/2013, 6:42 AM PGY-1, [place] Intern pager: [phone], text pages welcome

## 2013-04-26 ENCOUNTER — Inpatient Hospital Stay (HOSPITAL_COMMUNITY): Payer: Medicaid Other

## 2013-04-26 LAB — BASIC METABOLIC PANEL
Chloride: 101 mEq/L (ref 96–112)
GFR calc Af Amer: 61 mL/min — ABNORMAL LOW (ref >90)
GFR calc non Af Amer: 52 mL/min — ABNORMAL LOW (ref >90)
Potassium: 3.9 mEq/L (ref 3.5–5.1)
Sodium: 135 mEq/L (ref 135–145)

## 2013-04-26 LAB — MITOCHONDRIAL ANTIBODIES: Mitochondrial M2 Ab, IgG: 0.61 (ref <0.91)

## 2013-04-26 LAB — CERULOPLASMIN: Ceruloplasmin: 42 mg/dL (ref 20–60)

## 2013-04-26 LAB — HEPATIC FUNCTION PANEL
ALT: 925 U/L — ABNORMAL HIGH (ref 0–53)
Bilirubin, Direct: 2.3 mg/dL — ABNORMAL HIGH (ref 0.0–0.3)
Indirect Bilirubin: 1.3 mg/dL — ABNORMAL HIGH (ref 0.3–0.9)
Total Bilirubin: 3.6 mg/dL — ABNORMAL HIGH (ref 0.3–1.2)
Total Protein: 6.6 g/dL (ref 6.0–8.3)

## 2013-04-26 LAB — PROTIME-INR
INR: 1.96 — ABNORMAL HIGH (ref 0.00–1.49)
Prothrombin Time: 21.7 seconds — ABNORMAL HIGH (ref 11.6–15.2)

## 2013-04-26 LAB — ALPHA-1-ANTITRYPSIN: A-1 Antitrypsin, Ser: 198 mg/dL (ref 90–200)

## 2013-04-26 LAB — IGG: IgG (Immunoglobin G), Serum: 1620 mg/dL — ABNORMAL HIGH (ref 650–1600)

## 2013-04-26 MED ORDER — ASPIRIN 81 MG PO CHEW
81.0000 mg | CHEWABLE_TABLET | ORAL | Status: AC
  Administered 2013-04-27: 81 mg via ORAL
  Filled 2013-04-26: qty 1

## 2013-04-26 MED ORDER — SODIUM CHLORIDE 0.9 % IV SOLN: 250.0000 mL | INTRAVENOUS | Status: DC | PRN

## 2013-04-26 MED ORDER — SODIUM CHLORIDE 0.9 % IJ SOLN
3.0000 mL | Freq: Two times a day (BID) | INTRAMUSCULAR | Status: DC
  Administered 2013-04-26 – 2013-04-27 (×3): 3 mL via INTRAVENOUS

## 2013-04-26 MED ORDER — LEVOFLOXACIN IN D5W 750 MG/150ML IV SOLN
750.0000 mg | INTRAVENOUS | Status: DC
  Filled 2013-04-26: qty 150

## 2013-04-26 MED ORDER — POLYETHYLENE GLYCOL 3350 17 G PO PACK
17.0000 g | PACK | Freq: Every day | ORAL | Status: DC
  Administered 2013-04-26 – 2013-05-04 (×8): 17 g via ORAL
  Filled 2013-04-26 (×10): qty 1

## 2013-04-26 MED ORDER — SODIUM CHLORIDE 0.9 % IJ SOLN: 3.0000 mL | INTRAMUSCULAR | Status: DC | PRN

## 2013-04-26 NOTE — Progress Notes (Signed)
Co-signed for [employer] RN/[name] for assessments, IV assessments, medication administration, progress notes, patient education, I's and O's, and vital signs. Tonny Branch, RN/[name]

## 2013-04-26 NOTE — Progress Notes (Signed)
[facility] Daily Rounding Note 04/26/2013, 9:50 AM  LOS: 4 days   SUBJECTIVE:       No stools in > 2 days. No abdominal pain, no nausea.  Eating solids without problems  OBJECTIVE:         Vital signs in last 24 hours:    Temp:  [97 F (36.1 C)-98.3 F (36.8 C)] 98.3 F (36.8 C) (11/05 0447) Pulse Rate:  [74-81] 76 (11/05 0826) Resp:  [18] 18 (11/05 0447) BP: (99-109)/(63-76) 100/68 mmHg (11/05 0826) SpO2:  [98 %-100 %] 99 % (11/05 0826) Weight:  [95.255 kg (210 lb)] 95.255 kg (210 lb) (11/05 0447) Last BM Date: 04/23/13 General: weak, drowsy, comfotable   Heart: RRR Chest: clear bil  Slightly labored breathing Abdomen: NT, protuberant but soft.  Active BS  Extremities: no CCE Neuro/Psych:  Cooperative, appropriate, drowsy but easily aroused.  Relaxed.  Moves all 4 limbs.   Intake/Output from previous day: 11/04 0701 - 11/05 0700 In: 720 [P.O.:720] Out: 3075 [Urine:3075]  Intake/Output this shift:    Lab Results:  Recent Labs  04/23/13 2127 04/24/13 0217 04/25/13 0512  WBC 11.3* 13.2* 11.3*  HGB 12.8* 11.6* 10.7*  HCT 40.6 36.2* 33.7*  PLT 228 206 176   BMET  Recent Labs  04/25/13 0512 04/25/13 0910 04/26/13 0454  NA 134* 132* 135  K 4.6 4.4 3.9  CL 101 99 101  CO2 19 21 22   GLUCOSE 100* 123* 80  BUN 41* 41* 38*  CREATININE 1.60* 1.64* 1.41*  CALCIUM 9.2 9.0 9.0   LFT  Recent Labs  04/23/13 1210  04/24/13 0217 04/25/13 0512 04/26/13 0454  PROT  --   < > 6.7 6.6 6.6  ALBUMIN  --   < > 3.2* 3.1* 2.9*  AST  --   < > 1454* 1153* 691*  ALT  --   < > 1220* 1247* 925*  ALKPHOS  --   < > 76 79 74  BILITOT 3.6*  < > 4.2* 4.0* 3.6*  BILIDIR 2.1*  --   --   --  2.3*  IBILI 1.5*  --   --   --  1.3*  < > = values in this interval not displayed. PT/INR  Recent Labs  04/25/13 0512 04/26/13 0454  LABPROT 33.5* 21.7*  INR 3.46* 1.96*   Ammonia   55  Hepatitis Panel No results found for this basename: HEPBSAG, HCVAB, HEPAIGM, HEPBIGM,  in  the last 72 hours  Studies/Results: Echocardiogram 04/25/13 Study Conclusions - Left ventricle: Diffuse hypokinesis inferobasal akinesis The cavity size was severely dilated. Wall thickness was normal. Systolic function was severely reduced. The estimated ejection fraction was in the range of 20% to 25%. - Mitral valve: Mild regurgitation. - Left atrium: The atrium was moderately dilated. - Right ventricle: The cavity size was mildly dilated. - Right atrium: The atrium was mildly dilated. - Atrial septum: No defect or patent foramen ovale was identified. - Tricuspid valve: Mild-moderate regurgitation. - Pulmonary arteries: PA peak pressure: 86mm Hg (S).   ASSESMENT:    * Recurrent acute elevation of LFTs. Similar to 12/2012 when acutely ill with Rhabdo.  ? sequelae of right heart failure, shock liver? Transaminases, T bili improving steadily.  Total Hep B core + 12/2012, Hep B core IgM negative 04/22/13. No CT or ultrasound evidence of liver disease.  ANA, ASMA, ceruloplasmin negative. Ferritin 82 (not elevated or depressed).  IgG elevated at 1620  AMA pending.  Echocardiogram as  per above. nd liver doppler studies pending  * Coagulopathy. c/w hepatic dysfunction. Improved significantly in last 24 hours. Has not gotten Vitamin K or FFP.    * Acute renal failure. Persists but overall improved.  * Iron deficiency anemia  * CHF, primarily right sided. No congestion on CXR  * Community acquired PNA?Marland Kitchen On Vanc, Levaquin. Patchy opacity of right lung resolved.  * Type 2 DM. A1C is 5.7: Good control  * Constipation treated with Amitiza prn at home. Treated with Vanco po for C diff in 12/2012. No BMs     PLAN   *  Doppler studies today. Confirmed and reordered as outlined bu [place] tech after 30 minutes of phone tag.  This test needs to be ordered without the"limited" tag in future, that word limited makes it so ultrasound does not get the order...Marland KitchenMarland KitchenMarland Kitchen So they never saw the order.... Crazy epic  glitch. Ate breakfast so will not get done till this afternoon *  Changed Miralax from prn to daily. *  How long does he need to continue abx? *  FYI ( Dr Teryl Lucy) you can get records from [facility] admission in 12/2012 on Care Everywhere.  I reviewed these myself 2 days ago.     Azucena Freed  04/26/2013, 9:50 AM Pager: [phone]

## 2013-04-26 NOTE — Progress Notes (Signed)
Patient ID: [name] [name], male   DOB: 08/01/1950, 62 y.o.   MRN: [id number]    Primary Cardiologist:  [facility]  SUBJECTIVE: No complaints Seems a bit confused   LFTs remain elevated.   Marland Kitchen aspirin EC  81 mg Oral Daily  . furosemide  40 mg Intravenous BID  . levofloxacin (LEVAQUIN) IV  750 mg Intravenous Q48H  . metoprolol succinate  25 mg Oral Daily  . nicotine  7 mg Transdermal Daily  . pantoprazole  40 mg Oral Daily  . sodium chloride  3 mL Intravenous Q12H  . tiotropium  18 mcg Inhalation Daily  . vancomycin  750 mg Intravenous Q12H     Filed Vitals:   04/25/13 1420 04/25/13 1502 04/25/13 2215 04/26/13 0447  BP:  99/72 101/69 107/63  Pulse: 81 74 75 76  Temp:  97.6 F (36.4 C) 97 F (36.1 C) 98.3 F (36.8 C)  TempSrc:  Oral Oral Oral  Resp:  18 18 18   Height:      Weight:    210 lb (95.255 kg)  SpO2: 98% 100% 100% 99%    Intake/Output Summary (Last 24 hours) at 04/26/13 0735 Last data filed at 04/26/13 0300  Gross per 24 hour  Intake    720 ml  Output   2325 ml  Net  -1605 ml    LABS: Basic Metabolic Panel:  Recent Labs  04/25/13 0910 04/26/13 0454  NA 132* 135  K 4.4 3.9  CL 99 101  CO2 21 22  GLUCOSE 123* 80  BUN 41* 38*  CREATININE 1.64* 1.41*  CALCIUM 9.0 9.0   Liver Function Tests:  Recent Labs  04/25/13 0512 04/26/13 0454  AST 1153* 691*  ALT 1247* PENDING  ALKPHOS 79 74  BILITOT 4.0* 3.6*  PROT 6.6 6.6  ALBUMIN 3.1* 2.9*   CBC:  Recent Labs  04/23/13 2127 04/24/13 0217 04/25/13 0512  WBC 11.3* 13.2* 11.3*  NEUTROABS 8.8*  --   --   HGB 12.8* 11.6* 10.7*  HCT 40.6 36.2* 33.7*  MCV 78.8 78.2 76.9*  PLT 228 206 176   Cardiac Enzymes:  Recent Labs  04/23/13 2127 04/24/13 0217 04/24/13 1000  TROPONINI 1.11* <0.30 0.31*   Anemia Panel:  Recent Labs  04/24/13 1900  FERRITIN 82  TIBC 288  IRON 13*    RADIOLOGY: Ct Abdomen Pelvis Wo Contrast  04/22/2013   CLINICAL DATA:  Abdominal pain and distention.  EXAM: CT  ABDOMEN AND PELVIS WITHOUT CONTRAST  TECHNIQUE: Multidetector CT imaging of the abdomen and pelvis was performed following the standard protocol without intravenous contrast.  COMPARISON:  04/21/2013  FINDINGS: Mild ascites again seen within the abdomen and pelvis, without significant change. The liver, gallbladder, spleen, pancreas, adrenal glands, and kidneys are unremarkable in appearance except for small right renal cyst. Vicarious excretion of contrast noted in the gallbladder as well as mild residual contrast enhancement of both kidneys from the most recent CT. No soft tissue masses or lymphadenopathy identified within the abdomen or pelvis. Residual contrast seen in the urinary bladder.  Diverticulosis is seen mainly involving the sigmoid colon, however there is no evidence of diverticulitis. No focal inflammatory process or abscess identified. No evidence of bowel obstruction or hernia.  IMPRESSION: Mild ascites, without significant change since prior exam.  Diverticulosis. No radiographic evidence of diverticulitis.   Electronically Signed   By: Earle Gell M.D.   On: 04/22/2013 20:39   Dg Chest 2 View  04/22/2013   CLINICAL DATA:  Chest pain and vomiting  EXAM: CHEST  2 VIEW  COMPARISON:  04/21/2013  FINDINGS: The heart is again enlarged in size but stable. The lungs are well aerated bilaterally. Some very mild patchy changes are noted in the right upper lobe which may represent some early infiltrate.  IMPRESSION: Stable cardiomegaly.  New patchy changes in the right upper lobe which may represent an early infiltrate.   Electronically Signed   By: Inez Catalina M.D.   On: 04/22/2013 14:51   US Abdomen Complete  04/22/2013   CLINICAL DATA:  Abdominal pain.  EXAM: ULTRASOUND ABDOMEN COMPLETE  COMPARISON:  None.  FINDINGS: Gallbladder  No gallstones identified. Mild diffuse gallbladder wall thickening seen measuring 4 mm, however no sonographic Murphy sign was noted.  Common bile duct  Diameter: 3 mm   Liver  No focal lesion identified. Within normal limits in parenchymal echogenicity. Mild perihepatic ascites noted.  IVC  No abnormality visualized.  Pancreas  Visualized portion unremarkable.  Spleen  Size and appearance within normal limits.  Right Kidney  Length: 10.3 cm. Echogenicity within normal limits. No mass or hydronephrosis visualized.  Left Kidney  Length: 10.4 cm. Echogenicity within normal limits. No mass or hydronephrosis visualized.  Abdominal aorta  No aneurysm visualized.  IMPRESSION: No evidence of gallstones or biliary dilatation.  Mild diffuse gallbladder wall thickening, which is a nonspecific finding. Mild ascites also noted.  Unremarkable sonographic appearance of the liver.   Electronically Signed   By: Earle Gell M.D.   On: 04/22/2013 16:46   Dg Chest Port 1 View  04/23/2013   CLINICAL DATA:  62 year old male with reason chest pain and vomiting. Fluid status.  EXAM: PORTABLE CHEST - 1 VIEW  COMPARISON:  04/22/2013 and earlier.  FINDINGS: Portable AP semi upright view at 2056 hrs. Stable cardiomegaly and mediastinal contours. Previously seen subtle patchy right lung opacity does not persist. No pulmonary edema, pleural effusion, pneumothorax, or consolidation. Stable visualized osseous structures.  IMPRESSION: Interval resolved nonspecific patchy pulmonary opacity. Stable cardiomegaly and no acute cardiopulmonary abnormality.   Electronically Signed   By: Lars Pinks M.D.   On: 04/23/2013 21:09    PHYSICAL EXAM General: NAD Neck: JVP 12-14 cm with prominent V wave, no thyromegaly or thyroid nodule.  Lungs: Decreased breath sounds at bases. CV: Nondisplaced PMI.  Heart regular S1/S2, no S3/S4, 2/6 HSM LLSB.  1+ edema to knees bilaterally.  No carotid bruit.   Abdomen: Soft, nontender, no hepatosplenomegaly, moderate distention.  Neurologic: Alert with mild confusion.  Psych: Normal affect. Extremities: No clubbing or cyanosis.   TELEMETRY: Reviewed telemetry pt in  NSR  ASSESSMENT AND PLAN: 62 yo with history of COPD, diastolic CHF, and HTN presented with abdominal distention and liver failure.  He also has evidence for right-sided heart failure.  1. Liver failure: Patient has liver failure with prominently elevated transaminases and elevated INR.  He does have evidence on exam for right-sided heart failure with markedly high JVP.  His degree of liver dysfunction seems out of proportion to what you would expect from right-sided CHF.  He was mildly hypotensive briefly in the hospital but did not seem enough to cause this degree of liver dysfunction as shock liver.  No gallstones. Previous drinker indicates quit 2 years ago  2. CHF: Patient has prominent JVD and a TR murmur.  There is definite right-sided CHF on exam, echo not yet done to confirm RV and LV function.  He was seen in this hospital for diastolic  CHF back in 2012.  Apparently he is seeing a cardiologist in [place] who put him on Ranexa but he does not know why this was done.  He says he has never had a cardiac catheterization.  Creatinine is stable.  - Lasix 40 mg IV bid today.  - Echo EF 25% PA pressures not elevated - Will put on cath schedule for right and left in am   - Would stop Ranexa for now with liver failure.  Would try to get records from cardiologist in [place].  3. AKI on probable CKD.  Creatinine has been stable.  Cr improving   Jenkins Rouge 04/26/2013 7:35 AM

## 2013-04-26 NOTE — Progress Notes (Signed)
Patient seen, examined, and I agree with the above documentation, including the assessment and plan. LFTs are down-trending along with INR, which is encouraging. Liver doppler this afternoon. Plan for cardiac cath (left and right tomorrow) Trend LFTs/INR Other work-up for genetic and autoimmune causes of liver inflammation neg (IgG is barely elevated, but ANA neg making AIH very, very unlikely) I expect there is passive congestion and some element of ischemia.  Avoid hepatotoxic meds

## 2013-04-26 NOTE — Progress Notes (Signed)
Family Medicine Teaching Service Daily Progress Note Intern Pager: [phone]  Patient name: [name] [name] Medical record number: [id number] Date of birth: [birth year] Age: 62 y.o. Gender: male  Primary Care Provider: Fae Pippin Consultants: none Code Status: Full  Pt Overview and Major Events to Date:  04/23/13: Increased LFT's from previous day  Assessment and Plan: [name] [name] is a 62 y.o. male presenting with abdominal discomfort and distention, found to have AKI and markedly elevated LFT's . PMH is significant for CHF, COPD, HTN, and tobacco abuse.   # Transaminitis with elevated bilirubin, mild ascites: repeat LFT's today trending down with AST/ALT: 691/925 and tbili of 3.6. Unclear etiology. Differential: shock liver vs acute hepatitis vs RHF vs toxicity vs malignancy. a1 antitrypsin, ANA, ASMA, ceruloplasmin, AMA within normal limits. IgG was slightly elevated at 1620. INR trending down. Seems to be improving overall. - abd u/s and CT abdomen essentially unremarkable  - acute hepatitis panel negative, tylenol level neg - repeat CMP in am - hold home colchicine, amitiza and allopurinol - follow-up GI recommendations - follow-up hepatic ultrasound to assess hepatic vein and portal vein for clots - follow-up daily INR  # AKI, metabolic acidosis, elevated lactic acid: Acidosis has resolved. lactic acid increasing (3.7 from 3.1) . AG of 12. Unclear whether Cr is at baseline, since we do not have recent records.  - continue to hold home lisinopril - repeat lactic acid in AM - will try obtaining records from [facility] where he was apparently hospitalized  # CHF: EF: 20-25% which has worsened from previous 45-50% in 2012. PA pressure: 67mmHg.  - schedule for heart cath in AM - follow-up cardiology recommendations  # SOB: PNA vs CHF exacerbation, CXR with RUL infiltrate. - continue levaquin q48 hours for HCAP (Day #5) - continue vancomycin to broaden coverage (Day #3) - would be  helpful to find out dry weight - need to call PCP - strict I&O, daily weights  - albuterol, spiriva, incentive spirometry  - continue home toprol XL, hold home carvedilol - continue home ranolazine  - continue to monitor on telemetry  - continue lasix  # Right foot wound: being managed by wound care as an outpatient.  - wound care in hospital  # Right leg swelling and erythema: appears to be resolved  # Diarrhea:  - C. diff lab d/c'd by GI  # Diabetes: pt reports he has DM, not on any medications per med list. CBG's in 70's-100s  # Reported hematuria: urine not grossly infected on UA. Likely change in color from bilirubin - follow-up urine culture   # Gout: hold colchicine, hold allopurinol  # Tobacco abuse: will provide nicotine patch per pt request   FEN/GI: heart healthy diet, NS @ 75cc/hr Prophylaxis: heparin SQ   Disposition: pending recommendations from GI and cardiology   Subjective:  Difficult for me to understand but his only concern seemed to be feeling a little more fatigued than usual. Breathing well with nasal cannula.  Objective: Temp:  [97 F (36.1 C)-98.3 F (36.8 C)] 98.3 F (36.8 C) (11/05 0447) Pulse Rate:  [74-81] 76 (11/05 0447) Resp:  [18] 18 (11/05 0447) BP: (99-109)/(63-76) 107/63 mmHg (11/05 0447) SpO2:  [98 %-100 %] 99 % (11/05 0447) Weight:  [95.255 kg (210 lb)] 95.255 kg (210 lb) (11/05 0447)  Physical Exam: General: no acute distress, sitting up in chair, difficult for me to understand his speech HEENT: poor dentition Cardiovascular: S1S2, RRR, no murmur Respiratory: CTA, normal work of breathing,  no crackles appreciated Abdomen: soft, non tender, distended, + BS Extremities: Non-erythematous bilaterally, legs equal in warmth, no tenderness, mild edema bilaterally Neuro: follows commands, seems to be oriented but speech is difficult for me to understand  Laboratory:  Recent Labs Lab 04/23/13 2127 04/24/13 0217 04/25/13 0512   WBC 11.3* 13.2* 11.3*  HGB 12.8* 11.6* 10.7*  HCT 40.6 36.2* 33.7*  PLT 228 206 176    Recent Labs Lab 04/24/13 0217  04/25/13 0512 04/25/13 0910 04/26/13 0454  NA 132*  < > 134* 132* 135  K 5.1  < > 4.6 4.4 3.9  CL 98  < > 101 99 101  CO2 16*  < > 19 21 22   BUN 37*  < > 41* 41* 38*  CREATININE 1.80*  < > 1.60* 1.64* 1.41*  CALCIUM 9.2  < > 9.2 9.0 9.0  PROT 6.7  --  6.6  --  6.6  BILITOT 4.2*  --  4.0*  --  3.6*  ALKPHOS 76  --  79  --  74  ALT 1220*  --  1247*  --  925*  AST 1454*  --  1153*  --  691*  GLUCOSE 98  < > 100* 123* 80  < > = values in this interval not displayed.    04/24/2013 10:00 04/25/2013 05:12 04/26/2013 04:54  INR 3.71 (H) 3.46 (H) 1.96 (H)     Imaging/Diagnostic Tests: No recent imaging  Medications: Scheduled Meds: . aspirin EC  81 mg Oral Daily  . furosemide  40 mg Intravenous BID  . levofloxacin (LEVAQUIN) IV  750 mg Intravenous Q48H  . metoprolol succinate  25 mg Oral Daily  . nicotine  7 mg Transdermal Daily  . pantoprazole  40 mg Oral Daily  . sodium chloride  3 mL Intravenous Q12H  . tiotropium  18 mcg Inhalation Daily  . vancomycin  750 mg Intravenous Q12H   Continuous Infusions:  PRN Meds:.albuterol, ondansetron (ZOFRAN) IV, ondansetron, polyethylene glycol   Cordelia Poche, MD 04/26/2013, 8:23 AM PGY-1, [place] Intern pager: [phone], text pages welcome

## 2013-04-26 NOTE — Progress Notes (Signed)
Patient's care discussed with resident team, I reviewed and agree with Dr Lisbeth Ply assessment and plan for today.  Improvement in lab values (INR, transaminases).  Plan for Hepatic and Portal vein doppler studies.  Appreciate GI and Cardiology involvement in this case.  Dalbert Mayotte, MD

## 2013-04-27 ENCOUNTER — Encounter (HOSPITAL_COMMUNITY)
Admission: EM | Disposition: A | Discharge status: Home / Self Care | Payer: Self-pay | Source: Home / Self Care | Attending: Family Medicine

## 2013-04-27 DIAGNOSIS — I2510 Atherosclerotic heart disease of native coronary artery without angina pectoris: Secondary | ICD-10-CM

## 2013-04-27 DIAGNOSIS — K5900 Constipation, unspecified: Secondary | ICD-10-CM

## 2013-04-27 LAB — BASIC METABOLIC PANEL
BUN: 28 mg/dL — ABNORMAL HIGH (ref 6–23)
Calcium: 8.8 mg/dL (ref 8.4–10.5)
Chloride: 102 mEq/L (ref 96–112)
GFR calc Af Amer: 71 mL/min — ABNORMAL LOW (ref >90)
GFR calc non Af Amer: 61 mL/min — ABNORMAL LOW (ref >90)
Glucose, Bld: 82 mg/dL (ref 70–99)
Sodium: 139 mEq/L (ref 135–145)

## 2013-04-27 LAB — CBC
MCH: 25.1 pg — ABNORMAL LOW (ref 26.0–34.0)
MCHC: 33.1 g/dL (ref 30.0–36.0)
Platelets: 159 10*3/uL (ref 150–400)
RBC: 4.3 MIL/uL (ref 4.22–5.81)

## 2013-04-27 LAB — LACTIC ACID, PLASMA: Lactic Acid, Venous: 1.3 mmol/L (ref 0.5–2.2)

## 2013-04-27 LAB — HEPATIC FUNCTION PANEL
Indirect Bilirubin: 1.3 mg/dL — ABNORMAL HIGH (ref 0.3–0.9)
Total Protein: 5.9 g/dL — ABNORMAL LOW (ref 6.0–8.3)

## 2013-04-27 LAB — SURGICAL PCR SCREEN
MRSA, PCR: POSITIVE — AB
Staphylococcus aureus: POSITIVE — AB

## 2013-04-27 LAB — PROTIME-INR: INR: 2.16 — ABNORMAL HIGH (ref 0.00–1.49)

## 2013-04-27 SURGERY — LEFT AND RIGHT HEART CATHETERIZATION WITH CORONARY ANGIOGRAM
Anesthesia: LOCAL

## 2013-04-27 MED ORDER — MUPIROCIN 2 % EX OINT
1.0000 "application " | TOPICAL_OINTMENT | Freq: Two times a day (BID) | CUTANEOUS | Status: AC
  Administered 2013-04-27 – 2013-05-01 (×10): 1 via NASAL
  Filled 2013-04-27: qty 22

## 2013-04-27 MED ORDER — DIGOXIN 250 MCG PO TABS
0.2500 mg | ORAL_TABLET | Freq: Once | ORAL | Status: AC
  Administered 2013-04-27: 10:00:00 0.25 mg via ORAL
  Filled 2013-04-27 (×2): qty 1

## 2013-04-27 MED ORDER — DIGOXIN 125 MCG PO TABS
0.1250 mg | ORAL_TABLET | Freq: Every day | ORAL | Status: DC
  Administered 2013-04-28 – 2013-05-04 (×6): 0.125 mg via ORAL
  Filled 2013-04-27 (×8): qty 1

## 2013-04-27 MED ORDER — CARVEDILOL 3.125 MG PO TABS
3.1250 mg | ORAL_TABLET | Freq: Two times a day (BID) | ORAL | Status: DC
  Administered 2013-04-27 – 2013-04-30 (×6): 3.125 mg via ORAL
  Filled 2013-04-27 (×8): qty 1

## 2013-04-27 MED ORDER — CHLORHEXIDINE GLUCONATE CLOTH 2 % EX PADS
6.0000 | MEDICATED_PAD | Freq: Every day | CUTANEOUS | Status: AC
  Administered 2013-04-27 – 2013-05-01 (×5): 6 via TOPICAL

## 2013-04-27 MED ORDER — POTASSIUM CHLORIDE CRYS ER 20 MEQ PO TBCR
40.0000 meq | EXTENDED_RELEASE_TABLET | Freq: Once | ORAL | Status: AC
  Administered 2013-04-27: 10:00:00 40 meq via ORAL
  Filled 2013-04-27: qty 2

## 2013-04-27 MED ORDER — LISINOPRIL 2.5 MG PO TABS
2.5000 mg | ORAL_TABLET | Freq: Every day | ORAL | Status: DC
  Administered 2013-04-27 – 2013-04-30 (×4): 2.5 mg via ORAL
  Filled 2013-04-27 (×5): qty 1

## 2013-04-27 NOTE — Progress Notes (Signed)
[facility] Daily Rounding Note 04/27/2013, 9:30 AM  LOS: 5 days   SUBJECTIVE:       Had BM last night. No SOB, no cough, no abdominal pain.  Eating well.    OBJECTIVE:         Vital signs in last 24 hours:    Temp:  [97.5 F (36.4 C)-97.9 F (36.6 C)] 97.9 F (36.6 C) (11/06 0450) Pulse Rate:  [69-76] 75 (11/06 0450) Resp:  [17-18] 18 (11/06 0842) BP: (102-109)/(52-72) 108/52 mmHg (11/06 0450) SpO2:  [99 %-100 %] 100 % (11/06 0450) Weight:  [92.851 kg (204 lb 11.2 oz)] 92.851 kg (204 lb 11.2 oz) (11/06 0450) Last BM Date: 04/23/13 General: looks well.  comfortable   Heart: RRR.  Soft murmur Chest: no rales, no dyspnea, no cough Abdomen: soft, active BS, NT, ND  Extremities: 1 plus edema from knees down.  Neuro/Psych:  Pleasant, alert, appropriate.  Relaxed.  Enagaged.  Not confused.   Intake/Output from previous day: 11/05 0701 - 11/06 0700 In: 480 [P.O.:480] Out: 4425 [Urine:4425]  Intake/Output this shift: Total I/O In: 240 [P.O.:240] Out: 300 [Urine:300]  Lab Results:  Recent Labs  04/25/13 0512 04/27/13 0700  WBC 11.3* 8.1  HGB 10.7* 10.8*  HCT 33.7* 32.6*  PLT 176 159   BMET  Recent Labs  04/25/13 0910 04/26/13 0454 04/27/13 0600  NA 132* 135 139  K 4.4 3.9 3.4*  CL 99 101 102  CO2 21 22 29   GLUCOSE 123* 80 82  BUN 41* 38* 28*  CREATININE 1.64* 1.41* 1.24  CALCIUM 9.0 9.0 8.8   LFT  Recent Labs  04/25/13 0512 04/26/13 0454 04/27/13 0700  PROT 6.6 6.6 5.9*  ALBUMIN 3.1* 2.9* 2.7*  AST 1153* 691* 343*  ALT 1247* 925* 600*  ALKPHOS 79 74 74  BILITOT 4.0* 3.6* 3.7*  BILIDIR  --  2.3* 2.4*  IBILI  --  1.3* 1.3*   PT/INR  Recent Labs  04/26/13 0454 04/27/13 0600  LABPROT 21.7* 23.4*  INR 1.96* 2.16*   Hepatitis Panel No results found for this basename: HEPBSAG, HCVAB, HEPAIGM, HEPBIGM,  in the last 72 hours  Studies/Results: [place] Art/ven Flow Abd Pelv Doppler 04/26/2013    FINDINGS: Portal Vein Velocities  Main:  22-35  cm/sec; normal hepatopetal flow and waveform  Right:  24 cm/sec; normal hepatopetal flow and waveform  Left:  16 cm/sec; normal hepatopetal flow and waveform  Hepatic Vein Velocities  Right:  31 cm/sec  Middle:  52 cm/sec  Left:  45 cm/sec  Hepatic Artery Velocity:  60 cm/sec  Splenic Vein Velocity:  18 cm/sec  Varices: None identified.  Ascites: Small volume sonographically simple perihepatic ascites.  Other: Normal volume spleen. Hepatic veins are patent with hepatopetal flow but slightly abnormal spectral waveform tracings suggesting underlying tricuspid regurgitation.  IMPRESSION: 1. No evidence of hepatic vein, or portal vein thrombosis. 2. Normal hepatopetal flow and spectral waveforms in the portal vein. No findings definitive for portal hypertension. 3. Mildly abnormal venous waveforms in the hepatic veins suggesting tricuspid regurgitation. 4. Mild perihepatic ascites. Given findings of recent echocardiogram, suspect etiology is related to heart failure and passive hepatic venous congestion.   Electronically Signed   By: Jacqulynn Cadet M.D.   On: 04/26/2013 17:36   Echocardiogram 04/25/13 Study Conclusions - Left ventricle: Diffuse hypokinesis inferobasal akinesis The cavity size was severely dilated. Wall thickness was normal. Systolic function was severely reduced. The estimated ejection fraction was in the  range of 20% to 25%. - Mitral valve: Mild regurgitation. - Left atrium: The atrium was moderately dilated. - Right ventricle: The cavity size was mildly dilated. - Right atrium: The atrium was mildly dilated. - Atrial septum: No defect or patent foramen ovale was identified. - Tricuspid valve: Mild-moderate regurgitation. - Pulmonary arteries: PA peak pressure: 35mm Hg (S).    ASSESMENT:  * Recurrent acute elevation of LFTs. Similar to 12/2012 when acutely ill with Rhabdo.  Transaminases, T bili improving steadily.  No evidence of cirrhosis on ultrasound or CT scan. Doses have  small amount of ascites.  No PV or HV thrombosis on dopplers but did see venous waveform pattern suggestive of tricuspid regurgitation. This supports diagnoses of passive congestion from CHF as cause of LFT elevation.   Total Hep B core + 12/2012, Hep B core IgM negative 04/22/13. No CT or ultrasound evidence of liver disease.  ANA, AMA, ASMA, ceruloplasmin negative. Ferritin 82 (not elevated or depressed). IgG elevated at 1620  * Coagulopathy. c/w hepatic dysfunction. Improved overall but have climbed again in last 24 hours. Has not gotten Vitamin K or FFP.  * Acute renal failure, improved.  * Iron deficiency anemia.    * CHF, primarily right sided. Diuresed 11 # so far.  EF 20-25% on echo with moderate RV dysfunction.  Cardiology actively managing with med adjustments and new meds added today.  Dr Aundra Dubin talking about Cardiac cath, but at present INR too high.  * Type 2 DM. A1C is 5.7: Good control  * Constipation treated with Amitiza BID at home. Had BM last night.     PLAN   *  Maximize mgt of heart failure.  Cardiac cath possibly tomorrow if INR allows.  *  No plans for further GI workup, will sign off.  *  Consider giving Vit K to correct INR, may have no benefit, but unlikely to cause harm.     [name] [name]  04/27/2013, 9:30 AM Pager: [phone]

## 2013-04-27 NOTE — Progress Notes (Signed)
Physical Therapy Treatment Patient Details Name: [name] [name] MRN: [id number] DOB: [birth year] Today's Date: 04/27/2013 Time: [id number] PT Time Calculation (min): 25 min  PT Assessment / Plan / Recommendation  History of Present Illness [name] [name] is a 62 y.o. male presenting with abdominal discomfort and distention, found to have AKI and markedly elevated LFT's . PMH is significant for CHF, COPD, HTN, and tobacco abuse. Pt with heart and liver failure   PT Comments   Pt progressing with mobility and remarkably stronger with mobility and transfers than on eval. Pt educated for HEP and encouraged to continue ambulating with RW and staff. Will follow.   Follow Up Recommendations  Home health PT;Supervision - Intermittent     Does the patient have the potential to tolerate intense rehabilitation     Barriers to Discharge        Equipment Recommendations  None recommended by PT    Recommendations for Other Services    Frequency     Progress towards PT Goals Progress towards PT goals: Goals met and updated - see care plan  Plan Discharge plan needs to be updated    Precautions / Restrictions Precautions Precautions: Fall Restrictions Weight Bearing Restrictions: No   Pertinent Vitals/Pain No pain    Mobility  Bed Mobility Supine to Sit: 6: Modified independent (Device/Increase time);HOB flat;With rails Transfers Sit to Stand: 5: Supervision;From bed Stand to Sit: 5: Supervision;To chair/3-in-1;With armrests Details for Transfer Assistance: cueing for hand placement and sequence for safety Ambulation/Gait Ambulation/Gait Assistance: 5: Supervision Ambulation Distance (Feet): 400 Feet Assistive device: Rolling walker Ambulation/Gait Assistance Details: cueing for posture, position in RW and directional cues Gait Pattern: Step-through pattern;Decreased stride length Gait velocity: decreased Stairs: No    Exercises General Exercises - Lower Extremity Long Arc Quad: AROM;Both;20  reps;Seated Hip Flexion/Marching: AROM;Seated;Both;20 reps   PT Diagnosis:    PT Problem List:   PT Treatment Interventions:     PT Goals (current goals can now be found in the care plan section)    Visit Information  Last PT Received On: 04/27/13 Assistance Needed: +1 History of Present Illness: [name] [name] is a 62 y.o. male presenting with abdominal discomfort and distention, found to have AKI and markedly elevated LFT's . PMH is significant for CHF, COPD, HTN, and tobacco abuse. Pt with heart and liver failure    Subjective Data      Cognition  Cognition Arousal/Alertness: Awake/alert Behavior During Therapy: WFL for tasks assessed/performed Area of Impairment: Orientation Orientation Level: Time    Balance  Static Sitting Balance Static Sitting - Balance Support: No upper extremity supported;Feet supported Static Sitting - Level of Assistance: 7: Independent Static Sitting - Comment/# of Minutes: 5. Pt able to don/doff socks without difficulty unsupported  End of Session PT - End of Session Equipment Utilized During Treatment: Gait belt Activity Tolerance: Patient tolerated treatment well Patient left: in chair;with call bell/phone within reach;with nursing/sitter in room Nurse Communication: Mobility status   GP     Melford Aase 04/27/2013, 9:19 AM Elwyn Reach, [place]

## 2013-04-27 NOTE — Progress Notes (Signed)
FMTS Attending Note Patient seen and examined by me today, discussed with resident team and I agree with Dr Lisbeth Ply assessment and plan.  Mr. Brotherson reports that he is feeling much improved from previously; abdominal distention is much improved and he is able to ambulate with PT today in the hallway.   Transaminases are improving.  INR is still up at 2.16.  Diuresing as indicated by weight change. On exam, he is alert and pleasant, answering questions readily in armchair. No distress.  Abdomen is markedly less distended than on previous exams.   Assess/Plan: Patient with transaminitis likely secondary to "shock liver" from poor cardiac output and transient hypotension; now improving in all respects with diuresis.  Per their note, Cardiology with plans for cath tomorrow if INR comes below 1.8.  Wonder if FFP is an option if INR is mildly elevated above the threshold of 1.8.   Dalbert Mayotte, MD

## 2013-04-27 NOTE — Progress Notes (Signed)
Patient ID: [name] [name], male   DOB: [birth year], 62 y.o.   MRN: [id number]   SUBJECTIVE: Feels good this morning.  He is diuresing well and weight is coming down (11 lbs down so far).    Marland Kitchen aspirin EC  81 mg Oral Daily  . Chlorhexidine Gluconate Cloth  6 each Topical Q0600  . furosemide  40 mg Intravenous BID  . metoprolol succinate  25 mg Oral Daily  . mupirocin ointment  1 application Nasal BID  . nicotine  7 mg Transdermal Daily  . pantoprazole  40 mg Oral Daily  . polyethylene glycol  17 g Oral Daily  . sodium chloride  3 mL Intravenous Q12H  . sodium chloride  3 mL Intravenous Q12H  . tiotropium  18 mcg Inhalation Daily     Filed Vitals:   04/26/13 1016 04/26/13 1359 04/26/13 2021 04/27/13 0450  BP: 103/53 102/55 109/72 108/52  Pulse: 76 69 75 75  Temp:  97.6 F (36.4 C) 97.9 F (36.6 C) 97.9 F (36.6 C)  TempSrc:  Oral Oral Oral  Resp:  17 18 18   Height:      Weight:    204 lb 11.2 oz (92.851 kg)  SpO2:  99% 100% 100%    Intake/Output Summary (Last 24 hours) at 04/27/13 0733 Last data filed at 04/27/13 0451  Gross per 24 hour  Intake    480 ml  Output   4425 ml  Net  -3945 ml    LABS: Basic Metabolic Panel:  Recent Labs  04/26/13 0454 04/27/13 0600  NA 135 139  K 3.9 3.4*  CL 101 102  CO2 22 29  GLUCOSE 80 82  BUN 38* 28*  CREATININE 1.41* 1.24  CALCIUM 9.0 8.8   Liver Function Tests:  Recent Labs  04/25/13 0512 04/26/13 0454  AST 1153* 691*  ALT 1247* 925*  ALKPHOS 79 74  BILITOT 4.0* 3.6*  PROT 6.6 6.6  ALBUMIN 3.1* 2.9*   No results found for this basename: LIPASE, AMYLASE,  in the last 72 hours CBC:  Recent Labs  04/25/13 0512  WBC 11.3*  HGB 10.7*  HCT 33.7*  MCV 76.9*  PLT 176   Cardiac Enzymes:  Recent Labs  04/24/13 1000  TROPONINI 0.31*   BNP: No components found with this basename: POCBNP,  D-Dimer: No results found for this basename: DDIMER,  in the last 72 hours Hemoglobin A1C: No results found for this  basename: HGBA1C,  in the last 72 hours Fasting Lipid Panel: No results found for this basename: CHOL, HDL, LDLCALC, TRIG, CHOLHDL, LDLDIRECT,  in the last 72 hours Thyroid Function Tests: No results found for this basename: TSH, T4TOTAL, FREET3, T3FREE, THYROIDAB,  in the last 72 hours Anemia Panel:  Recent Labs  04/24/13 1900  FERRITIN 82  TIBC 288  IRON 13*    RADIOLOGY: Ct Abdomen Pelvis Wo Contrast  04/22/2013   CLINICAL DATA:  Abdominal pain and distention.  EXAM: CT ABDOMEN AND PELVIS WITHOUT CONTRAST  TECHNIQUE: Multidetector CT imaging of the abdomen and pelvis was performed following the standard protocol without intravenous contrast.  COMPARISON:  04/21/2013  FINDINGS: Mild ascites again seen within the abdomen and pelvis, without significant change. The liver, gallbladder, spleen, pancreas, adrenal glands, and kidneys are unremarkable in appearance except for small right renal cyst. Vicarious excretion of contrast noted in the gallbladder as well as mild residual contrast enhancement of both kidneys from the most recent CT. No soft tissue masses or lymphadenopathy  identified within the abdomen or pelvis. Residual contrast seen in the urinary bladder.  Diverticulosis is seen mainly involving the sigmoid colon, however there is no evidence of diverticulitis. No focal inflammatory process or abscess identified. No evidence of bowel obstruction or hernia.  IMPRESSION: Mild ascites, without significant change since prior exam.  Diverticulosis. No radiographic evidence of diverticulitis.   Electronically Signed   By: Earle Gell M.D.   On: 04/22/2013 20:39   Dg Chest 2 View  04/22/2013   CLINICAL DATA:  Chest pain and vomiting  EXAM: CHEST  2 VIEW  COMPARISON:  04/21/2013  FINDINGS: The heart is again enlarged in size but stable. The lungs are well aerated bilaterally. Some very mild patchy changes are noted in the right upper lobe which may represent some early infiltrate.  IMPRESSION:  Stable cardiomegaly.  New patchy changes in the right upper lobe which may represent an early infiltrate.   Electronically Signed   By: Inez Catalina M.D.   On: 04/22/2013 14:51   US Abdomen Complete  04/22/2013   CLINICAL DATA:  Abdominal pain.  EXAM: ULTRASOUND ABDOMEN COMPLETE  COMPARISON:  None.  FINDINGS: Gallbladder  No gallstones identified. Mild diffuse gallbladder wall thickening seen measuring 4 mm, however no sonographic Murphy sign was noted.  Common bile duct  Diameter: 3 mm  Liver  No focal lesion identified. Within normal limits in parenchymal echogenicity. Mild perihepatic ascites noted.  IVC  No abnormality visualized.  Pancreas  Visualized portion unremarkable.  Spleen  Size and appearance within normal limits.  Right Kidney  Length: 10.3 cm. Echogenicity within normal limits. No mass or hydronephrosis visualized.  Left Kidney  Length: 10.4 cm. Echogenicity within normal limits. No mass or hydronephrosis visualized.  Abdominal aorta  No aneurysm visualized.  IMPRESSION: No evidence of gallstones or biliary dilatation.  Mild diffuse gallbladder wall thickening, which is a nonspecific finding. Mild ascites also noted.  Unremarkable sonographic appearance of the liver.   Electronically Signed   By: Earle Gell M.D.   On: 04/22/2013 16:46   Dg Chest Port 1 View  04/23/2013   CLINICAL DATA:  62 year old male with reason chest pain and vomiting. Fluid status.  EXAM: PORTABLE CHEST - 1 VIEW  COMPARISON:  04/22/2013 and earlier.  FINDINGS: Portable AP semi upright view at 2056 hrs. Stable cardiomegaly and mediastinal contours. Previously seen subtle patchy right lung opacity does not persist. No pulmonary edema, pleural effusion, pneumothorax, or consolidation. Stable visualized osseous structures.  IMPRESSION: Interval resolved nonspecific patchy pulmonary opacity. Stable cardiomegaly and no acute cardiopulmonary abnormality.   Electronically Signed   By: Lars Pinks M.D.   On: 04/23/2013 21:09     PHYSICAL EXAM General: NAD Neck: JVP 12-14 cm with prominent V wave, no thyromegaly or thyroid nodule.  Lungs: Decreased breath sounds at bases. CV: Nondisplaced PMI.  Heart regular S1/S2, no S3/S4, 2/6 HSM LLSB.  1+ edema to knees bilaterally.  No carotid bruit.   Abdomen: Soft, nontender, no hepatosplenomegaly, moderate distention.  Neurologic: Alert with mild confusion.  Psych: Normal affect. Extremities: No clubbing or cyanosis.   TELEMETRY: Reviewed telemetry pt in NSR  ASSESSMENT AND PLAN: 62 yo with history of COPD, diastolic CHF, and HTN presented with abdominal distention and elevated LFTs.  Echo yesterday showed EF 20-25% with moderate RV dysfunction. 1. Elevated LFTs: Abdominal CT not suggestive of cirrhosis and hepatic dopplers do not show portal vein or hepatic vein thrombosis.  Possible that elevated LFTs are due to combination  of passive congestion from CHF and a component of shock liver from poor heart output.  LFTs have been trending down (not done yet today).   2. Acute on ?chronic systolic CHF: EF 0000000 on echo with moderate RV dysfunction.  I am concerned for low output state as a trigger for shock liver/elevated LFTs as well as significant RV dysfunction with passive congestion.  He remains volume overloaded.  He feels much better overall. - Continue Lasix 40 mg IV bid as he has had a good diuresis so far.  May need to increase dose if diuresis starts to taper off.  - Would stop Toprol XL and use low dose Coreg 3.125 mg bid.  - Will add low dose lisinopril 2.5 mg daily.   - Will add digoxin give low output.   - He will need left and right heart cath prior to discharge to assess for CAD as cause of CMP and to assess filling pressures/output.  However, INR is 2.16 today so I will not cath him today (elective). Will make NPO tomorrow morning, can cath if INR < 1.8.  3. AKI on probable CKD.  Resolved, creatinine down to 1.2.   Loralie Champagne 04/27/2013 7:33 AM

## 2013-04-27 NOTE — Progress Notes (Signed)
Patient refuses to sign consent until daughter is here.

## 2013-04-27 NOTE — Progress Notes (Signed)
Family Medicine Teaching Service Daily Progress Note Intern Pager: [phone]  Patient name: [name] [name] Medical record number: [id number] Date of birth: 1951/05/16 Age: 62 y.o. Gender: male  Primary Care Provider: Fae Pippin Consultants: none Code Status: Full  Pt Overview and Major Events to Date:  04/23/13: Increased LFT's from previous day 04/26/13: Hepatic u/s shows no hepatic/portal vein clots.  Assessment and Plan: [name] [name] is a 62 y.o. male presenting with abdominal discomfort and distention, found to have AKI and markedly elevated LFT's . PMH is significant for CHF, COPD, HTN, and tobacco abuse. Currently improving.  # Transaminitis with elevated bilirubin, mild ascites: repeat LFT's today trending down with AST/ALT: 343/600 and tbili of 3.7. Appears to be more related to RHF. INR still elevated. Seems to be improving overall. Hepatic ultrasound revealed no portal/hepatic vein clots with persistent mild peripheral ascites and mildly abnormal venous waveforms suggesting TR.  - hold home colchicine, amitiza and allopurinol - follow-up GI recommendations - follow-up daily INR - PT recommends home health PT  # AKI, metabolic acidosis, elevated lactic acid: Acidosis has resolved. Lactic acid decreased to 1.3.   # CHF: EF: 20-25% which has worsened from previous 45-50% in 2012. PA pressure: 24mmHg. Weight on admission: 215lbs.  - UOP in last 24 hours: 4.4L - Weight today is 204lbs - cardiology recs:  - discontinue metoprolol  - start low dose carvedilol  - start low dose lisinopril  - start digoxin  - continue lasix 40mg  IV BID  - follow-up for heart cath tomorrow AM if INR <1.8  # SOB: Resolved. Possibly due to CHF. - discontinue antibiotics - strict I&O, daily weights  - albuterol, spiriva - continue to monitor on telemetry   # Hypokalemia: potassium 3.4 today. Currently on lasix - give K+ 73meq x1  # Right foot wound: being managed by wound care as an  outpatient.  - wound care in hospital  # Right leg swelling and erythema: appears to be resolved  # Diabetes: pt reports he has DM, not on any medications per med list. CBG's in 70's-100s. A1C is 5.7  # Reported hematuria: urine not grossly infected on UA. Likely change in color from bilirubin - urine culture shows no growth (final)  # Gout: hold colchicine, hold allopurinol  # Tobacco abuse: will provide nicotine patch per pt request   FEN/GI: NPO at midnight, KVO Prophylaxis: heparin SQ   Disposition: Home pending results of cath tomorrow and cardiology/GI recommendations   Subjective:  [name] [name] reports no problems overnight. He has been comfortable breathing and is feeling better overall. He reports having a bowel movement last night.   Objective: Temp:  [97.5 F (36.4 C)-97.9 F (36.6 C)] 97.9 F (36.6 C) (11/06 0450) Pulse Rate:  [69-76] 75 (11/06 0450) Resp:  [17-18] 18 (11/06 0450) BP: (102-109)/(52-72) 108/52 mmHg (11/06 0450) SpO2:  [99 %-100 %] 100 % (11/06 0450) Weight:  [92.851 kg (204 lb 11.2 oz)] 92.851 kg (204 lb 11.2 oz) (11/06 0450)  Physical Exam: General: no acute distress, was walking with PT, looks comfortable HEENT: poor dentition Cardiovascular: S1S2, RRR, no murmur Respiratory: CTA, normal work of breathing, no crackles appreciated Abdomen: soft, non tender, distended, +BS Extremities: Non-erythematous bilaterally, legs equal in warmth, no tenderness, mild edema bilaterally Neuro: Alert and oriented x3, easy to understand  Laboratory:  Recent Labs Lab 04/24/13 0217 04/25/13 0512 04/27/13 0700  WBC 13.2* 11.3* 8.1  HGB 11.6* 10.7* 10.8*  HCT 36.2* 33.7* 32.6*  PLT 206 176  159    Recent Labs Lab 04/25/13 0512 04/25/13 0910 04/26/13 0454 04/27/13 0600 04/27/13 0700  NA 134* 132* 135 139  --   K 4.6 4.4 3.9 3.4*  --   CL 101 99 101 102  --   CO2 19 21 22 29   --   BUN 41* 41* 38* 28*  --   CREATININE 1.60* 1.64* 1.41* 1.24  --    CALCIUM 9.2 9.0 9.0 8.8  --   PROT 6.6  --  6.6  --  5.9*  BILITOT 4.0*  --  3.6*  --  3.7*  ALKPHOS 79  --  74  --  74  ALT 1247*  --  925*  --  600*  AST 1153*  --  691*  --  343*  GLUCOSE 100* 123* 80 82  --       04/25/2013 05:12 04/26/2013 04:54 04/27/2013 06:00  INR 3.46 (H) 1.96 (H) 2.16 (H)    Imaging/Diagnostic Tests: No recent imaging  Medications: Scheduled Meds: . aspirin EC  81 mg Oral Daily  . carvedilol  3.125 mg Oral BID WC  . Chlorhexidine Gluconate Cloth  6 each Topical Q0600  . [START ON 04/28/2013] digoxin  0.125 mg Oral Daily  . digoxin  0.25 mg Oral Once  . furosemide  40 mg Intravenous BID  . lisinopril  2.5 mg Oral Daily  . mupirocin ointment  1 application Nasal BID  . nicotine  7 mg Transdermal Daily  . pantoprazole  40 mg Oral Daily  . polyethylene glycol  17 g Oral Daily  . potassium chloride  40 mEq Oral Once  . sodium chloride  3 mL Intravenous Q12H  . sodium chloride  3 mL Intravenous Q12H  . tiotropium  18 mcg Inhalation Daily   Continuous Infusions:  PRN Meds:.sodium chloride, albuterol, ondansetron (ZOFRAN) IV, ondansetron, sodium chloride   Cordelia Poche, MD 04/27/2013, 8:35 AM PGY-1, [place] Intern pager: [phone], text pages welcome

## 2013-04-27 NOTE — Progress Notes (Signed)
Patient seen, examined, and I agree with the above documentation, including the assessment and plan. Improving LFTs and INR, all positive signs.  Suspect acute hepatic congestion with possible element of shock liver LFTs can be followed as outpt to ensure normalization CHF management per cardiology team Would continue lubiprostone at home twice daily for hx of constipation Call with questions

## 2013-04-27 NOTE — Care Management Note (Addendum)
    Page 1 of 2   05/11/2013     3:43:45 PM   CARE MANAGEMENT NOTE 05/11/2013  Patient:  [name] [name],[name] [name]   Account Number:  [id number]  Date Initiated:  04/27/2013  Documentation initiated by:  Fuller Mandril  Subjective/Objective Assessment:   62 y.o. male presenting with abdominal discomfort and distention, found to have AKI and markedly elevated LFT's . PMH is significant for CHF, COPD, HTN, and tobacco abuse./ Hm with girlfriend     Action/Plan:   diurese/ home wirh [place]   Anticipated DC Date:  05/12/2013   Anticipated DC Plan:  [place]  CM consult      [facility] Choice  Resumption Of Svcs/PTA Provider   Choice offered to / List presented to:  C-1 Patient        [place] arranged  HH-1 RN  [place] agency  [place]   Status of service:  Completed, signed off Medicare Important Message given?   (If response is "NO", the following Medicare IM given date fields will be blank) Date Medicare IM given:   Date Additional Medicare IM given:    Discharge Disposition:  [place]  Per UR Regulation:  Reviewed for med. necessity/level of care/duration of stay  If discussed at [place] of Stay Meetings, dates discussed:   04/27/2013  05/02/2013  05/04/2013  05/09/2013    Comments:  05/11/13 Render [name],RN,[name] [id number] PT [name] [name] WITH GF AND HH SERVICES AS ARRANGED. NOTIFIED [name].  05/09/13 [name] Bubeck,RN,[name] [id number] PT ACTUALLY WAS ACTIVE WITH [facility] PRIOR TO ADMISSION, PER GF.  CONFIRMED THIS WITH [name] OF [facility]...PT RECEIVING HHRN AND HHPT AT HOME.  WILL RESUME SERVICES AND ADD HHA AS ORDERED.  PT DENIES ANY DME NEEDS.  PT STATES GIRLFRIEND IS THERE 24/7 AT DC.  05/05/13 [name] [name] [id number] CABG TODAY  04/27/13 [name] Bay, RN, [name], [name] [phone] Pt previously active with [facility] for  RN services.  NCM confirmed with [name] [name] that he is eligibe for services upon discharge.

## 2013-04-27 NOTE — Progress Notes (Signed)
Dr Berkley Harvey made aware pt positive for MRSA and Staph on PCR surgical screening. Pt for cath in am.

## 2013-04-27 NOTE — Progress Notes (Signed)
Patient ID: [name] [name], male   DOB: 08/22/1950, 62 y.o.   MRN: [id number]    SUBJECTIVE:    Please refer to the complete progress note by Dr. Johnsie Cancel yesterday. Right and left heart cath has been arranged for today.  Daryel November, MD

## 2013-04-28 ENCOUNTER — Encounter (HOSPITAL_COMMUNITY)
Admission: EM | Disposition: A | Discharge status: Home / Self Care | Payer: Self-pay | Source: Home / Self Care | Attending: Family Medicine

## 2013-04-28 ENCOUNTER — Other Ambulatory Visit: Payer: Self-pay | Admitting: *Deleted

## 2013-04-28 DIAGNOSIS — I2510 Atherosclerotic heart disease of native coronary artery without angina pectoris: Secondary | ICD-10-CM

## 2013-04-28 DIAGNOSIS — I5021 Acute systolic (congestive) heart failure: Secondary | ICD-10-CM | POA: Diagnosis present

## 2013-04-28 HISTORY — PX: LEFT AND RIGHT HEART CATHETERIZATION WITH CORONARY ANGIOGRAM: SHX5449

## 2013-04-28 LAB — POCT I-STAT 3, VENOUS BLOOD GAS (G3P V)
Acid-Base Excess: 6 mmol/L — ABNORMAL HIGH (ref 0.0–2.0)
Bicarbonate: 31.6 mEq/L — ABNORMAL HIGH (ref 20.0–24.0)
Bicarbonate: 31.9 mEq/L — ABNORMAL HIGH (ref 20.0–24.0)
O2 Saturation: 68 %
O2 Saturation: 72 %
TCO2: 33 mmol/L (ref 0–100)
pCO2, Ven: 48.4 mmHg (ref 45.0–50.0)
pH, Ven: 7.427 — ABNORMAL HIGH (ref 7.250–7.300)
pO2, Ven: 35 mmHg (ref 30.0–45.0)
pO2, Ven: 37 mmHg (ref 30.0–45.0)

## 2013-04-28 LAB — POCT I-STAT 3, ART BLOOD GAS (G3+)
Acid-Base Excess: 4 mmol/L — ABNORMAL HIGH (ref 0.0–2.0)
Bicarbonate: 29.2 mEq/L — ABNORMAL HIGH (ref 20.0–24.0)
O2 Saturation: 87 %
TCO2: 31 mmol/L (ref 0–100)

## 2013-04-28 LAB — CBC
HCT: 32.8 % — ABNORMAL LOW (ref 39.0–52.0)
MCHC: 32.3 g/dL (ref 30.0–36.0)
MCV: 75.6 fL — ABNORMAL LOW (ref 78.0–100.0)
Platelets: 167 10*3/uL (ref 150–400)
RDW: 21.2 % — ABNORMAL HIGH (ref 11.5–15.5)
WBC: 8.6 10*3/uL (ref 4.0–10.5)

## 2013-04-28 LAB — COMPREHENSIVE METABOLIC PANEL
ALT: 441 U/L — ABNORMAL HIGH (ref 0–53)
AST: 199 U/L — ABNORMAL HIGH (ref 0–37)
Albumin: 2.5 g/dL — ABNORMAL LOW (ref 3.5–5.2)
Alkaline Phosphatase: 77 U/L (ref 39–117)
CO2: 29 mEq/L (ref 19–32)
GFR calc non Af Amer: 73 mL/min — ABNORMAL LOW (ref >90)
Potassium: 3.6 mEq/L (ref 3.5–5.1)
Sodium: 139 mEq/L (ref 135–145)
Total Bilirubin: 2.9 mg/dL — ABNORMAL HIGH (ref 0.3–1.2)
Total Protein: 5.9 g/dL — ABNORMAL LOW (ref 6.0–8.3)

## 2013-04-28 LAB — PROTIME-INR: Prothrombin Time: 19.3 seconds — ABNORMAL HIGH (ref 11.6–15.2)

## 2013-04-28 SURGERY — LEFT AND RIGHT HEART CATHETERIZATION WITH CORONARY ANGIOGRAM
Anesthesia: LOCAL

## 2013-04-28 MED ORDER — SODIUM CHLORIDE 0.9 % IV SOLN
INTRAVENOUS | Status: AC
  Administered 2013-04-28: 10:00:00 via INTRAVENOUS

## 2013-04-28 MED ORDER — ACETAMINOPHEN 325 MG PO TABS
650.0000 mg | ORAL_TABLET | ORAL | Status: DC | PRN
  Administered 2013-04-29 – 2013-05-02 (×5): 650 mg via ORAL
  Filled 2013-04-28 (×5): qty 2

## 2013-04-28 MED ORDER — NITROGLYCERIN 0.2 MG/ML ON CALL CATH LAB
INTRAVENOUS | Status: AC
  Filled 2013-04-28: qty 1

## 2013-04-28 MED ORDER — LIDOCAINE HCL (PF) 1 % IJ SOLN
INTRAMUSCULAR | Status: AC
  Filled 2013-04-28: qty 30

## 2013-04-28 MED ORDER — HEPARIN (PORCINE) IN NACL 2-0.9 UNIT/ML-% IJ SOLN
INTRAMUSCULAR | Status: AC
  Filled 2013-04-28: qty 1000

## 2013-04-28 MED ORDER — FENTANYL CITRATE 0.05 MG/ML IJ SOLN
INTRAMUSCULAR | Status: AC
  Filled 2013-04-28: qty 2

## 2013-04-28 MED ORDER — SIMVASTATIN 20 MG PO TABS
20.0000 mg | ORAL_TABLET | Freq: Every day | ORAL | Status: DC
  Administered 2013-04-28 – 2013-05-11 (×13): 20 mg via ORAL
  Filled 2013-04-28 (×14): qty 1

## 2013-04-28 MED ORDER — LIDOCAINE-EPINEPHRINE 1 %-1:100000 IJ SOLN
INTRAMUSCULAR | Status: AC
  Filled 2013-04-28: qty 1

## 2013-04-28 MED ORDER — MIDAZOLAM HCL 2 MG/2ML IJ SOLN
INTRAMUSCULAR | Status: AC
  Filled 2013-04-28: qty 2

## 2013-04-28 NOTE — H&P (View-Only) (Signed)
Patient ID: [name] [name], male   DOB: April 03, 1951, 62 y.o.   MRN: [id number]   SUBJECTIVE: Feels good this morning.  He is diuresing well and weight is coming down (11 lbs down so far).    Marland Kitchen aspirin EC  81 mg Oral Daily  . Chlorhexidine Gluconate Cloth  6 each Topical Q0600  . furosemide  40 mg Intravenous BID  . metoprolol succinate  25 mg Oral Daily  . mupirocin ointment  1 application Nasal BID  . nicotine  7 mg Transdermal Daily  . pantoprazole  40 mg Oral Daily  . polyethylene glycol  17 g Oral Daily  . sodium chloride  3 mL Intravenous Q12H  . sodium chloride  3 mL Intravenous Q12H  . tiotropium  18 mcg Inhalation Daily     Filed Vitals:   04/26/13 1016 04/26/13 1359 04/26/13 2021 04/27/13 0450  BP: 103/53 102/55 109/72 108/52  Pulse: 76 69 75 75  Temp:  97.6 F (36.4 C) 97.9 F (36.6 C) 97.9 F (36.6 C)  TempSrc:  Oral Oral Oral  Resp:  17 18 18   Height:      Weight:    204 lb 11.2 oz (92.851 kg)  SpO2:  99% 100% 100%    Intake/Output Summary (Last 24 hours) at 04/27/13 0733 Last data filed at 04/27/13 0451  Gross per 24 hour  Intake    480 ml  Output   4425 ml  Net  -3945 ml    LABS: Basic Metabolic Panel:  Recent Labs  04/26/13 0454 04/27/13 0600  NA 135 139  K 3.9 3.4*  CL 101 102  CO2 22 29  GLUCOSE 80 82  BUN 38* 28*  CREATININE 1.41* 1.24  CALCIUM 9.0 8.8   Liver Function Tests:  Recent Labs  04/25/13 0512 04/26/13 0454  AST 1153* 691*  ALT 1247* 925*  ALKPHOS 79 74  BILITOT 4.0* 3.6*  PROT 6.6 6.6  ALBUMIN 3.1* 2.9*   No results found for this basename: LIPASE, AMYLASE,  in the last 72 hours CBC:  Recent Labs  04/25/13 0512  WBC 11.3*  HGB 10.7*  HCT 33.7*  MCV 76.9*  PLT 176   Cardiac Enzymes:  Recent Labs  04/24/13 1000  TROPONINI 0.31*   BNP: No components found with this basename: POCBNP,  D-Dimer: No results found for this basename: DDIMER,  in the last 72 hours Hemoglobin A1C: No results found for this  basename: HGBA1C,  in the last 72 hours Fasting Lipid Panel: No results found for this basename: CHOL, HDL, LDLCALC, TRIG, CHOLHDL, LDLDIRECT,  in the last 72 hours Thyroid Function Tests: No results found for this basename: TSH, T4TOTAL, FREET3, T3FREE, THYROIDAB,  in the last 72 hours Anemia Panel:  Recent Labs  04/24/13 1900  FERRITIN 82  TIBC 288  IRON 13*    RADIOLOGY: Ct Abdomen Pelvis Wo Contrast  04/22/2013   CLINICAL DATA:  Abdominal pain and distention.  EXAM: CT ABDOMEN AND PELVIS WITHOUT CONTRAST  TECHNIQUE: Multidetector CT imaging of the abdomen and pelvis was performed following the standard protocol without intravenous contrast.  COMPARISON:  04/21/2013  FINDINGS: Mild ascites again seen within the abdomen and pelvis, without significant change. The liver, gallbladder, spleen, pancreas, adrenal glands, and kidneys are unremarkable in appearance except for small right renal cyst. Vicarious excretion of contrast noted in the gallbladder as well as mild residual contrast enhancement of both kidneys from the most recent CT. No soft tissue masses or lymphadenopathy  identified within the abdomen or pelvis. Residual contrast seen in the urinary bladder.  Diverticulosis is seen mainly involving the sigmoid colon, however there is no evidence of diverticulitis. No focal inflammatory process or abscess identified. No evidence of bowel obstruction or hernia.  IMPRESSION: Mild ascites, without significant change since prior exam.  Diverticulosis. No radiographic evidence of diverticulitis.   Electronically Signed   By: Earle Gell M.D.   On: 04/22/2013 20:39   Dg Chest 2 View  04/22/2013   CLINICAL DATA:  Chest pain and vomiting  EXAM: CHEST  2 VIEW  COMPARISON:  04/21/2013  FINDINGS: The heart is again enlarged in size but stable. The lungs are well aerated bilaterally. Some very mild patchy changes are noted in the right upper lobe which may represent some early infiltrate.  IMPRESSION:  Stable cardiomegaly.  New patchy changes in the right upper lobe which may represent an early infiltrate.   Electronically Signed   By: Inez Catalina M.D.   On: 04/22/2013 14:51   US Abdomen Complete  04/22/2013   CLINICAL DATA:  Abdominal pain.  EXAM: ULTRASOUND ABDOMEN COMPLETE  COMPARISON:  None.  FINDINGS: Gallbladder  No gallstones identified. Mild diffuse gallbladder wall thickening seen measuring 4 mm, however no sonographic Murphy sign was noted.  Common bile duct  Diameter: 3 mm  Liver  No focal lesion identified. Within normal limits in parenchymal echogenicity. Mild perihepatic ascites noted.  IVC  No abnormality visualized.  Pancreas  Visualized portion unremarkable.  Spleen  Size and appearance within normal limits.  Right Kidney  Length: 10.3 cm. Echogenicity within normal limits. No mass or hydronephrosis visualized.  Left Kidney  Length: 10.4 cm. Echogenicity within normal limits. No mass or hydronephrosis visualized.  Abdominal aorta  No aneurysm visualized.  IMPRESSION: No evidence of gallstones or biliary dilatation.  Mild diffuse gallbladder wall thickening, which is a nonspecific finding. Mild ascites also noted.  Unremarkable sonographic appearance of the liver.   Electronically Signed   By: Earle Gell M.D.   On: 04/22/2013 16:46   Dg Chest Port 1 View  04/23/2013   CLINICAL DATA:  62 year old male with reason chest pain and vomiting. Fluid status.  EXAM: PORTABLE CHEST - 1 VIEW  COMPARISON:  04/22/2013 and earlier.  FINDINGS: Portable AP semi upright view at 2056 hrs. Stable cardiomegaly and mediastinal contours. Previously seen subtle patchy right lung opacity does not persist. No pulmonary edema, pleural effusion, pneumothorax, or consolidation. Stable visualized osseous structures.  IMPRESSION: Interval resolved nonspecific patchy pulmonary opacity. Stable cardiomegaly and no acute cardiopulmonary abnormality.   Electronically Signed   By: Lars Pinks M.D.   On: 04/23/2013 21:09     PHYSICAL EXAM General: NAD Neck: JVP 12-14 cm with prominent V wave, no thyromegaly or thyroid nodule.  Lungs: Decreased breath sounds at bases. CV: Nondisplaced PMI.  Heart regular S1/S2, no S3/S4, 2/6 HSM LLSB.  1+ edema to knees bilaterally.  No carotid bruit.   Abdomen: Soft, nontender, no hepatosplenomegaly, moderate distention.  Neurologic: Alert with mild confusion.  Psych: Normal affect. Extremities: No clubbing or cyanosis.   TELEMETRY: Reviewed telemetry pt in NSR  ASSESSMENT AND PLAN: 62 yo with history of COPD, diastolic CHF, and HTN presented with abdominal distention and elevated LFTs.  Echo yesterday showed EF 20-25% with moderate RV dysfunction. 1. Elevated LFTs: Abdominal CT not suggestive of cirrhosis and hepatic dopplers do not show portal vein or hepatic vein thrombosis.  Possible that elevated LFTs are due to combination  of passive congestion from CHF and a component of shock liver from poor heart output.  LFTs have been trending down (not done yet today).   2. Acute on ?chronic systolic CHF: EF 0000000 on echo with moderate RV dysfunction.  I am concerned for low output state as a trigger for shock liver/elevated LFTs as well as significant RV dysfunction with passive congestion.  He remains volume overloaded.  He feels much better overall. - Continue Lasix 40 mg IV bid as he has had a good diuresis so far.  May need to increase dose if diuresis starts to taper off.  - Would stop Toprol XL and use low dose Coreg 3.125 mg bid.  - Will add low dose lisinopril 2.5 mg daily.   - Will add digoxin give low output.   - He will need left and right heart cath prior to discharge to assess for CAD as cause of CMP and to assess filling pressures/output.  However, INR is 2.16 today so I will not cath him today (elective). Will make NPO tomorrow morning, can cath if INR < 1.8.  3. AKI on probable CKD.  Resolved, creatinine down to 1.2.   Loralie Champagne 04/27/2013 7:33 AM

## 2013-04-28 NOTE — Consult Note (Signed)
Reason for Consult:3 vessel CAD with ischemic cardiomyopathy Referring Physician: Dr. John Giovanni is an 62 y.o. male.  HPI: 62 yo male presented 04/22/2013 with a cc/o abdominal distention and pain  Mr. Harstad is a 62 yo male with multiple CRF including hypertension, hyperlipidemia, DM, and tobacco abuse. He also has known CHF and COPD. He was admitted to [facility] in July with CHF and weakness. He apparently developed myopathy, rhabdomyolysis and VDRF due to pneumonia. He apparently also had a bradycardic arrest. An echo there showed an EF of 30%. He also was treated for C diff at that time.  He presented here on 11/1 with c/o abdominal distention and discomfort. He also c/o swelling in his legs, and increased SOB. He had a mild cough. He denied fever or chills.He was found to have markedly elevated liver enzymes, jaundice, coagulopathy and acute kidney dysfunction with hematuria. He was noted to be in acute decompensated right and left heart diastolic and systolic failure.   He had an echo which showed severe LV dysfunction with an EF of 20-25%, mild MR and moderate TR. At cath today he has severe 3 vessel CAD.   His transaminases, PT and creatinine have improved with medical management. He denies any chest pain, pressure or tightness and says his abdomen is "about normal"    Past Medical History  Diagnosis Date  . COPD (chronic obstructive pulmonary disease)   . Gout   . Emphysema   . Hypertension   . Hyperlipidemia   . Tobacco abuse     30+ pack-year history  . Diabetes mellitus without complication   . CHF (congestive heart failure)     Past Surgical History  Procedure Laterality Date  . Appendectomy  as a child    Family History  Problem Relation Age of Onset  . Diabetes Mother   . Hypertension    . Heart attack      Brothers    Social History:  reports that he quit smoking about 1 years ago. He does not have any smokeless tobacco history on file. He  reports that he does not drink alcohol or use illicit drugs.  Allergies:  Allergies  Allergen Reactions  . Penicillins Other (See Comments)    Swelling , long time ago.     Medications: acetaminophen (TYLENOL) tablet 650 mg albuterol (PROVENTIL HFA;VENTOLIN HFA) 108 (90 BASE) MCG/ACT inhaler 2 puff aspirin EC tablet 81 mg carvedilol (COREG) tablet 3.125 mg Chlorhexidine Gluconate Cloth 2 % PADS 6 each digoxin (LANOXIN) tablet 0.125 mg furosemide (LASIX) injection 40 mg lisinopril (PRINIVIL,ZESTRIL) tablet 2.5 mg mupirocin ointment (BACTROBAN) 2 % 1 application nicotine (NICODERM CQ - dosed in mg/24 hr) patch 7 mg ondansetron (ZOFRAN) injection 4 mg ondansetron (ZOFRAN) tablet 4 mg pantoprazole (PROTONIX) EC tablet 40 mg polyethylene glycol (MIRALAX / GLYCOLAX) packet 17 g simvastatin (ZOCOR) tablet 20 mg sodium chloride 0.9 % injection 3 mL tiotropium (SPIRIVA) inhalation capsule 18 mcg   Results for orders placed during the hospital encounter of 04/22/13 (from the past 48 hour(s))  SURGICAL PCR SCREEN     Status: Abnormal   Collection Time    04/26/13 11:45 PM      Result Value Range   MRSA, PCR POSITIVE (*) NEGATIVE   Comment: RESULT CALLED TO, READ BACK BY AND VERIFIED WITH:     R COLUMBLRES,RN [id number] WILDERK/SKEENP   Staphylococcus aureus POSITIVE (*) NEGATIVE   Comment:  The Xpert SA Assay (FDA     approved for NASAL specimens     in patients over 88 years of age),     is one component of     a comprehensive surveillance     program.  Test performance has     been validated by [employer] for patients greater     than or equal to 76 year old.     It is not intended     to diagnose infection nor to     guide or monitor treatment.  PROTIME-INR     Status: Abnormal   Collection Time    04/27/13  6:00 AM      Result Value Range   Prothrombin Time 23.4 (*) 11.6 - 15.2 seconds   INR 2.16 (*) 0.00 - [id number]  BASIC METABOLIC PANEL     Status: Abnormal    Collection Time    04/27/13  6:00 AM      Result Value Range   Sodium 139  135 - 145 mEq/L   Potassium 3.4 (*) 3.5 - 5.1 mEq/L   Chloride 102  96 - 112 mEq/L   CO2 29  19 - 32 mEq/L   Glucose, Bld 82  70 - 99 mg/dL   BUN 28 (*) 6 - 23 mg/dL   Creatinine, Ser 1.24  0.50 - 1.35 mg/dL   Calcium 8.8  8.4 - 10.5 mg/dL   GFR calc non Af Amer 61 (*) >90 mL/min   GFR calc Af Amer 71 (*) >90 mL/min   Comment: (NOTE)     The eGFR has been calculated using the CKD EPI equation.     This calculation has not been validated in all clinical situations.     eGFR's persistently <90 mL/min signify possible Chronic Kidney     Disease.  LACTIC ACID, PLASMA     Status: None   Collection Time    04/27/13  6:11 AM      Result Value Range   Lactic Acid, Venous 1.3  0.5 - 2.2 mmol/L  CBC     Status: Abnormal   Collection Time    04/27/13  7:00 AM      Result Value Range   WBC 8.1  4.0 - 10.5 K/uL   RBC 4.30  4.22 - 5.81 MIL/uL   Hemoglobin 10.8 (*) 13.0 - 17.0 g/dL   HCT 32.6 (*) 39.0 - 52.0 %   MCV 75.8 (*) 78.0 - 100.0 fL   MCH 25.1 (*) 26.0 - 34.0 pg   MCHC 33.1  30.0 - 36.0 g/dL   RDW 21.1 (*) 11.5 - 15.5 %   Platelets 159  150 - 400 K/uL  HEPATIC FUNCTION PANEL     Status: Abnormal   Collection Time    04/27/13  7:00 AM      Result Value Range   Total Protein 5.9 (*) 6.0 - 8.3 g/dL   Albumin 2.7 (*) 3.5 - 5.2 g/dL   AST 343 (*) 0 - 37 U/L   ALT 600 (*) 0 - 53 U/L   Alkaline Phosphatase 74  39 - 117 U/L   Total Bilirubin 3.7 (*) 0.3 - 1.2 mg/dL   Bilirubin, Direct 2.4 (*) 0.0 - 0.3 mg/dL   Indirect Bilirubin 1.3 (*) 0.3 - 0.9 mg/dL  PROTIME-INR     Status: Abnormal   Collection Time    04/28/13  5:25 AM      Result Value Range  Prothrombin Time 19.3 (*) 11.6 - 15.2 seconds   INR 1.68 (*) 0.00 - 1.49  COMPREHENSIVE METABOLIC PANEL     Status: Abnormal   Collection Time    04/28/13  5:25 AM      Result Value Range   Sodium 139  135 - 145 mEq/L   Potassium 3.6  3.5 - 5.1 mEq/L    Chloride 102  96 - 112 mEq/L   CO2 29  19 - 32 mEq/L   Glucose, Bld 76  70 - 99 mg/dL   BUN 20  6 - 23 mg/dL   Creatinine, Ser 1.07  0.50 - 1.35 mg/dL   Calcium 8.9  8.4 - 10.5 mg/dL   Total Protein 5.9 (*) 6.0 - 8.3 g/dL   Albumin 2.5 (*) 3.5 - 5.2 g/dL   AST 199 (*) 0 - 37 U/L   ALT 441 (*) 0 - 53 U/L   Alkaline Phosphatase 77  39 - 117 U/L   Total Bilirubin 2.9 (*) 0.3 - 1.2 mg/dL   GFR calc non Af Amer 73 (*) >90 mL/min   GFR calc Af Amer 85 (*) >90 mL/min   Comment: (NOTE)     The eGFR has been calculated using the CKD EPI equation.     This calculation has not been validated in all clinical situations.     eGFR's persistently <90 mL/min signify possible Chronic Kidney     Disease.  CBC     Status: Abnormal   Collection Time    04/28/13  5:25 AM      Result Value Range   WBC 8.6  4.0 - 10.5 K/uL   RBC 4.34  4.22 - 5.81 MIL/uL   Hemoglobin 10.6 (*) 13.0 - 17.0 g/dL   HCT 32.8 (*) 39.0 - 52.0 %   MCV 75.6 (*) 78.0 - 100.0 fL   MCH 24.4 (*) 26.0 - 34.0 pg   MCHC 32.3  30.0 - 36.0 g/dL   RDW 21.2 (*) 11.5 - 15.5 %   Platelets 167  150 - 400 K/uL  POCT I-STAT 3, BLOOD GAS (G3+)     Status: Abnormal   Collection Time    04/28/13  8:05 AM      Result Value Range   pH, Arterial 7.424  7.350 - 7.450   pCO2 arterial 44.6  35.0 - 45.0 mmHg   pO2, Arterial 52.0 (*) 80.0 - 100.0 mmHg   Bicarbonate 29.2 (*) 20.0 - 24.0 mEq/L   TCO2 31  0 - 100 mmol/L   O2 Saturation 87.0     Acid-Base Excess 4.0 (*) 0.0 - 2.0 mmol/L   Sample type ARTERIAL    POCT I-STAT 3, BLOOD GAS (G3P V)     Status: Abnormal   Collection Time    04/28/13  8:08 AM      Result Value Range   pH, Ven 7.438 (*) 7.250 - 7.300   pCO2, Ven 46.6  45.0 - 50.0 mmHg   pO2, Ven 37.0  30.0 - 45.0 mmHg   Bicarbonate 31.6 (*) 20.0 - 24.0 mEq/L   TCO2 33  0 - 100 mmol/L   O2 Saturation 72.0     Acid-Base Excess 6.0 (*) 0.0 - 2.0 mmol/L   Sample type VENOUS     Comment NOTIFIED PHYSICIAN    POCT I-STAT 3, BLOOD GAS  (G3P V)     Status: Abnormal   Collection Time    04/28/13  8:13 AM  Result Value Range   pH, Ven 7.427 (*) 7.250 - 7.300   pCO2, Ven 48.4  45.0 - 50.0 mmHg   pO2, Ven 35.0  30.0 - 45.0 mmHg   Bicarbonate 31.9 (*) 20.0 - 24.0 mEq/L   TCO2 33  0 - 100 mmol/L   O2 Saturation 68.0     Acid-Base Excess 6.0 (*) 0.0 - 2.0 mmol/L   Sample type VENOUS     Comment NOTIFIED PHYSICIAN      No results found.  Review of Systems  Constitutional: Positive for malaise/fatigue.       Poor appetite, weight gain  HENT:       + loose teeth  Respiratory: Positive for shortness of breath.   Cardiovascular: Positive for leg swelling. Negative for chest pain.  Gastrointestinal:       Abdominal distention  Genitourinary: Positive for hematuria.  Musculoskeletal: Positive for myalgias.  Skin: Positive for itching.  Neurological: Positive for weakness.       Confusion  All other systems reviewed and are negative.   Blood pressure 117/67, pulse 79, temperature 97.9 F (36.6 C), temperature source Oral, resp. rate 18, height 5' 7.5" (1.715 m), weight 199 lb 3.2 oz (90.357 kg), SpO2 98.00%. Physical Exam  Vitals reviewed. Constitutional: He is oriented to person, place, and time. He appears well-developed. No distress.  HENT:  Head: Normocephalic and atraumatic.  Poor dentition  Eyes: EOM are normal. Pupils are equal, round, and reactive to light. Scleral icterus is present.  Neck: Neck supple. No thyromegaly present.  Cardiovascular: Normal rate and regular rhythm.   Murmur (2/6 systolic) heard. Respiratory: Effort normal. He has no wheezes. He has rales.  Diminished in bases  GI: Soft. He exhibits distension (ascites). There is no tenderness.  Musculoskeletal: He exhibits edema (1+).  Lymphadenopathy:    He has no cervical adenopathy.  Neurological: He is alert and oriented to person, place, and time. No cranial nerve deficit.  Skin: Skin is warm and dry.   ECHOCARDIOGRAM  04/25/2013  ------------------------------------------------------------ Indications: CHF - 428.0.  ------------------------------------------------------------ History: PMH: Acute respiratory distress. Pneumonia. Chronic obstructive pulmonary disease. Risk factors: Hypertension.  ------------------------------------------------------------ Study Conclusions  - Left ventricle: Diffuse hypokinesis inferobasal akinesis The cavity size was severely dilated. Wall thickness was normal. Systolic function was severely reduced. The estimated ejection fraction was in the range of 20% to 25%. - Mitral valve: Mild regurgitation. - Left atrium: The atrium was moderately dilated. - Right ventricle: The cavity size was mildly dilated. - Right atrium: The atrium was mildly dilated. - Atrial septum: No defect or patent foramen ovale was identified. - Tricuspid valve: Mild-moderate regurgitation. - Pulmonary arteries: PA peak pressure: 21mm Hg (S).   CARDIAC CATHETERIZATION 04/28/2013  Hemodynamic Findings:  Ao: 105/59  LV: 110/10/19  RA: 13  RV: 42/5//13  PA: 47/15 (mean 31)  PCWP: 19  Fick Cardiac Output: 10 L/min  Fick Cardiac Index: 4.86 L/min/m2  Central Aortic Saturation: 87%  Pulmonary Artery Saturation: 68%  Angiographic Findings:  Left main: 10% stenosis.  Left Anterior Descending Artery: Large caliber vessel that courses to the apex. Diffuse 40% proximal stenosis. The mid vessel has a focal 70% stenosis. The distal vessel has diffuse mild plaque. The diagonal branch is moderate in caliber, 50% proximal stenosis.  Circumflex Artery: Moderate caliber vessel that has diffuse 60% proximal stenosis. The mid vessel has 99% stenosis followed by a 100% occlusion. The small to moderate caliber first obtuse marginal branch has diffuse 50% stenosis. The moderate  caliber second obtuse marginal branch fills from left to left collaterals.  Right Coronary Artery: Large dominant vessel with 100%  proximal occlusion. The mid, distal vessel fills from left to right collaterals.  Left Ventricular Angiogram: Deferred.  Impression:  1. 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 NSTEMI  3. LV systolic dysfunction.  Recommendations: Continue medical management of his CHF, cardiomyopathy and CAD. After volume status is optimized, will need CT surgery consult for CABG.   Assessment/Plan: 62 yo male with multiple medical problems presented with decompensated heart failure with hepatic and renal dysfunction. He has improved with medical management. 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 He would have a survival benefit with CABG. He is not having any chest pain, but there is a good possibility that his CHF would be easier to manage as well. He is a high risk surgical candidate, but his RHC results and good target vessels are favorable.  He has improved dramatically since admission but needs further medical tune up before he is ready for surgery. He also has bad dental issues that need to be addressed prior to CABG.  I discussed the general nature of the procedure, the need for general anesthesia, and incisions to be used with him. We discussed the expected hospital stay, overall recovery and short and long term outcomes. He understands the risks include, but are not limited to, death(5%), stroke, MI, DVT/PE, bleeding, possible need for transfusion, infections, other organ system dysfunction including respiratory, renal, or GI complications. He wishes to talk with his sister before making any decisions.  I have ordered an [place] C 04/28/2013, 5:57 PM

## 2013-04-28 NOTE — Progress Notes (Signed)
Patient ID: [name] [name], male   DOB: 11-Sep-1950, 62 y.o.   MRN: [id number]   SUBJECTIVE: Feels ok. Denies CP or dyspnea.   Cath this am with severe 3V CAD. RHC as below  Ao: 105/59  LV: 110/10/19  RA: 13  RV: 42/5//13  PA: 47/15 (mean 31)  PCWP: 19  Fick Cardiac Output: 10 L/min  Fick Cardiac Index: 4.86 L/min/m2  PVR: 1.2 WU Central Aortic Saturation: 87%  Pulmonary Artery Saturation: 68%  Medications   . aspirin EC  81 mg Oral Daily  . carvedilol  3.125 mg Oral BID WC  . Chlorhexidine Gluconate Cloth  6 each Topical Q0600  . digoxin  0.125 mg Oral Daily  . furosemide  40 mg Intravenous BID  . lisinopril  2.5 mg Oral Daily  . mupirocin ointment  1 application Nasal BID  . nicotine  7 mg Transdermal Daily  . pantoprazole  40 mg Oral Daily  . polyethylene glycol  17 g Oral Daily  . sodium chloride  3 mL Intravenous Q12H  . tiotropium  18 mcg Inhalation Daily     Filed Vitals:   04/27/13 1525 04/27/13 2125 04/28/13 0540 04/28/13 0730  BP: 107/66 99/64 116/63   Pulse: 72 76 78 73  Temp: 97.5 F (36.4 C) 97.8 F (36.6 C) 97.8 F (36.6 C)   TempSrc: Oral Oral Oral   Resp: 18 16 17    Height:      Weight:   90.357 kg (199 lb 3.2 oz)   SpO2: 100% 97% 98%     Intake/Output Summary (Last 24 hours) at 04/28/13 1331 Last data filed at 04/28/13 1210  Gross per 24 hour  Intake    580 ml  Output   4000 ml  Net  -3420 ml    LABS: Basic Metabolic Panel:  Recent Labs  04/27/13 0600 04/28/13 0525  NA 139 139  K 3.4* 3.6  CL 102 102  CO2 29 29  GLUCOSE 82 76  BUN 28* 20  CREATININE 1.24 1.07  CALCIUM 8.8 8.9   Liver Function Tests:  Recent Labs  04/27/13 0700 04/28/13 0525  AST 343* 199*  ALT 600* 441*  ALKPHOS 74 77  BILITOT 3.7* 2.9*  PROT 5.9* 5.9*  ALBUMIN 2.7* 2.5*   No results found for this basename: LIPASE, AMYLASE,  in the last 72 hours CBC:  Recent Labs  04/27/13 0700 04/28/13 0525  WBC 8.1 8.6  HGB 10.8* 10.6*  HCT 32.6* 32.8*  MCV  75.8* 75.6*  PLT 159 167    RADIOLOGY: Ct Abdomen Pelvis Wo Contrast  04/22/2013   CLINICAL DATA:  Abdominal pain and distention.  EXAM: CT ABDOMEN AND PELVIS WITHOUT CONTRAST  TECHNIQUE: Multidetector CT imaging of the abdomen and pelvis was performed following the standard protocol without intravenous contrast.  COMPARISON:  04/21/2013  FINDINGS: Mild ascites again seen within the abdomen and pelvis, without significant change. The liver, gallbladder, spleen, pancreas, adrenal glands, and kidneys are unremarkable in appearance except for small right renal cyst. Vicarious excretion of contrast noted in the gallbladder as well as mild residual contrast enhancement of both kidneys from the most recent CT. No soft tissue masses or lymphadenopathy identified within the abdomen or pelvis. Residual contrast seen in the urinary bladder.  Diverticulosis is seen mainly involving the sigmoid colon, however there is no evidence of diverticulitis. No focal inflammatory process or abscess identified. No evidence of bowel obstruction or hernia.  IMPRESSION: Mild ascites, without significant change since prior  exam.  Diverticulosis. No radiographic evidence of diverticulitis.   Electronically Signed   By: Earle Gell M.D.   On: 04/22/2013 20:39   Dg Chest 2 View  04/22/2013   CLINICAL DATA:  Chest pain and vomiting  EXAM: CHEST  2 VIEW  COMPARISON:  04/21/2013  FINDINGS: The heart is again enlarged in size but stable. The lungs are well aerated bilaterally. Some very mild patchy changes are noted in the right upper lobe which may represent some early infiltrate.  IMPRESSION: Stable cardiomegaly.  New patchy changes in the right upper lobe which may represent an early infiltrate.   Electronically Signed   By: Inez Catalina M.D.   On: 04/22/2013 14:51   US Abdomen Complete  04/22/2013   CLINICAL DATA:  Abdominal pain.  EXAM: ULTRASOUND ABDOMEN COMPLETE  COMPARISON:  None.  FINDINGS: Gallbladder  No gallstones identified.  Mild diffuse gallbladder wall thickening seen measuring 4 mm, however no sonographic Murphy sign was noted.  Common bile duct  Diameter: 3 mm  Liver  No focal lesion identified. Within normal limits in parenchymal echogenicity. Mild perihepatic ascites noted.  IVC  No abnormality visualized.  Pancreas  Visualized portion unremarkable.  Spleen  Size and appearance within normal limits.  Right Kidney  Length: 10.3 cm. Echogenicity within normal limits. No mass or hydronephrosis visualized.  Left Kidney  Length: 10.4 cm. Echogenicity within normal limits. No mass or hydronephrosis visualized.  Abdominal aorta  No aneurysm visualized.  IMPRESSION: No evidence of gallstones or biliary dilatation.  Mild diffuse gallbladder wall thickening, which is a nonspecific finding. Mild ascites also noted.  Unremarkable sonographic appearance of the liver.   Electronically Signed   By: Earle Gell M.D.   On: 04/22/2013 16:46   Dg Chest Port 1 View  04/23/2013   CLINICAL DATA:  62 year old male with reason chest pain and vomiting. Fluid status.  EXAM: PORTABLE CHEST - 1 VIEW  COMPARISON:  04/22/2013 and earlier.  FINDINGS: Portable AP semi upright view at 2056 hrs. Stable cardiomegaly and mediastinal contours. Previously seen subtle patchy right lung opacity does not persist. No pulmonary edema, pleural effusion, pneumothorax, or consolidation. Stable visualized osseous structures.  IMPRESSION: Interval resolved nonspecific patchy pulmonary opacity. Stable cardiomegaly and no acute cardiopulmonary abnormality.   Electronically Signed   By: Lars Pinks M.D.   On: 04/23/2013 21:09    PHYSICAL EXAM General: NAD HEENT: normal except for poor dentition Neck: JVP ~ 10 prominent V wave, no thyromegaly or thyroid nodule.  Lungs: Decreased breath sounds at bases. CV: Nondisplaced PMI.  Heart regular S1/S2, no S3/S4, 2/6 HSM LLSB.  tr edema to knees bilaterally.  No carotid bruit.   Abdomen: Soft, nontender, no hepatosplenomegaly  mild distention (improving).  Neurologic: sleepy after cath but arousable Psych: Normal affect. Extremities: No clubbing or cyanosis.   TELEMETRY: Reviewed telemetry pt in NSR  ASSESSMENT AND PLAN: 62 yo with history of COPD, diastolic CHF, and HTN presented with abdominal distention and elevated LFTs in setting of ADHF  Echo yesterday showed EF 20-25% with moderate RV dysfunction.  1. Elevated LFTs: Improving. likely due to HF. May have component of fatty liver. Hepatitis panels negative.  2. Acute on chronic systolic CHF: EF 0000000 on echo with moderate RV dysfunction 3. AKI on probable CKD.  Resolved, creatinine down to 1.2.  4. 3V CAD  Volume status much improved. Cardiac output better than expected. Evidence of RV dysfunction on RHC. Will switch to po demadex. Have consulted TCTS  to see if he is CABG candidate. Will likely need teeth pulled prior to surgery. Given low BP will not titrate HF meds further at this time. But would continue to push as BP tolerates.   Start low dose statin for CAD being mindful of elevated LFTs.   Quillian Quince Milfred Krammes 04/28/2013 1:31 PM

## 2013-04-28 NOTE — Progress Notes (Signed)
Family Medicine Teaching Service Daily Progress Note Intern Pager: [phone]  Patient name: [name] [name] Medical record number: [id number] Date of birth: 09/16/1950 Age: 62 y.o. Gender: male  Primary Care Provider: Fae Pippin Consultants: none Code Status: Full  Pt Overview and Major Events to Date:  04/23/13: Increased LFT's from previous day 04/26/13: Hepatic u/s shows no hepatic/portal vein clots. 04/28/13: cardiac cath  Assessment and Plan: [name] [name] is a 62 y.o. male presenting with abdominal discomfort and distention, found to have AKI and markedly elevated LFT's . PMH is significant for CHF, COPD, HTN, and tobacco abuse. Currently improving.  # Transaminitis with elevated bilirubin, mild ascites: repeat LFT's today trending down with AST/ALT: 199/441 and tbili of 2.9. Appears to be more related to RHF. INR trending down and currently 1.68. GI signed off. Appreciated their help. He is improving overall - hold home colchicine, amitiza and allopurinol - follow-up daily INR - PT recommends home health PT  # AKI, metabolic acidosis, elevated lactic acid (Resolved): Acidosis has resolved. Lactic acid decreased to 1.3. Creatinine trending down, today 1.07  # CHF: EF: 20-25% which has worsened from previous 45-50% in 2012. PA pressure: 32mmHg. Weight on admission: 215lbs. Currently diuresing well.  - UOP in last 24 hours: 3.8L (1.36ml/kg/hr) - Weight today is 199lbs - cardiology recs:  - discontinue metoprolol  - continue carvedilol  - continue lisinopril  - continue digoxin  - continue lasix 40mg  IV BID  - follow-up for heart cath today (INR is <1.8)  # SOB: Resolved. Most likely due to CHF. - discontinue antibiotics - strict I&O, daily weights  - albuterol, spiriva - continue to monitor on telemetry   # Hypokalemia: potassium 3.6 today. Currently on lasix  # Right foot wound: being managed by wound care as an outpatient.  - wound care in hospital  # Right leg swelling  and erythema: appears to be resolved  # Diabetes: pt reports he has DM, not on any medications per med list. CBG's in 70's-100s. A1C is 5.7  # Gout: hold colchicine, hold allopurinol  # Tobacco abuse: will provide nicotine patch per pt request   FEN/GI: NPO at midnight, KVO Prophylaxis: heparin SQ   Disposition: Home pending results of cath and cardiology recommendations  Subjective: [name] [name] reports no problems overnight. He has been comfortable breathing and is feeling better overall. He is breathing well. Enjoys walking and does not have any shortness of breath while walking.  Objective: Temp:  [97.5 F (36.4 C)-97.8 F (36.6 C)] 97.8 F (36.6 C) (11/07 0540) Pulse Rate:  [72-78] 78 (11/07 0540) Resp:  [16-18] 17 (11/07 0540) BP: (99-116)/(63-66) 116/63 mmHg (11/07 0540) SpO2:  [97 %-100 %] 98 % (11/07 0540) Weight:  [199 lb 3.2 oz (90.357 kg)] 199 lb 3.2 oz (90.357 kg) (11/07 0540)  Physical Exam: General: no acute distress, comfortable in bed. Just returned from cath lab, speech intelligible  HEENT: poor dentition Cardiovascular: S1S2, RRR, no murmur Respiratory: CTA, normal work of breathing, no crackles appreciated Abdomen: soft, non tender, distended but improved from prior exams, +BS Extremities: Non-erythematous bilaterally, legs equal in warmth, no tenderness, mild edema bilaterally Neuro: Alert and oriented x3, easy to understand  Laboratory:  Recent Labs Lab 04/25/13 0512 04/27/13 0700 04/28/13 0525  WBC 11.3* 8.1 8.6  HGB 10.7* 10.8* 10.6*  HCT 33.7* 32.6* 32.8*  PLT 176 159 167    Recent Labs Lab 04/26/13 0454 04/27/13 0600 04/27/13 0700 04/28/13 0525  NA 135 139  --  139  K 3.9 3.4*  --  3.6  CL 101 102  --  102  CO2 22 29  --  29  BUN 38* 28*  --  20  CREATININE 1.41* 1.24  --  1.07  CALCIUM 9.0 8.8  --  8.9  PROT 6.6  --  5.9* 5.9*  BILITOT 3.6*  --  3.7* 2.9*  ALKPHOS 74  --  74 77  ALT 925*  --  600* 441*  AST 691*  --  343* 199*   GLUCOSE 80 82  --  76      04/25/2013 05:12 04/26/2013 04:54 04/27/2013 06:00 04/28/2013 05:25  INR 3.46 (H) 1.96 (H) 2.16 (H) 1.68 (H)     Imaging/Diagnostic Tests: No recent imaging  Medications: Scheduled Meds: . aspirin EC  81 mg Oral Daily  . carvedilol  3.125 mg Oral BID WC  . Chlorhexidine Gluconate Cloth  6 each Topical Q0600  . digoxin  0.125 mg Oral Daily  . furosemide  40 mg Intravenous BID  . lisinopril  2.5 mg Oral Daily  . mupirocin ointment  1 application Nasal BID  . nicotine  7 mg Transdermal Daily  . pantoprazole  40 mg Oral Daily  . polyethylene glycol  17 g Oral Daily  . sodium chloride  3 mL Intravenous Q12H  . sodium chloride  3 mL Intravenous Q12H  . tiotropium  18 mcg Inhalation Daily   Continuous Infusions:  PRN Meds:.sodium chloride, albuterol, ondansetron (ZOFRAN) IV, ondansetron, sodium chloride   Cordelia Poche, MD 04/28/2013, 7:02 AM PGY-1, [place] Intern pager: [phone], text pages welcome

## 2013-04-28 NOTE — CV Procedure (Signed)
      Cardiac Catheterization Operative Report  Gill Sargis [id number] 11/7/20148:25 AM [name], PA-C  Procedure Performed:  1. Left Heart Catheterization 2. Selective Coronary Angiography 3. Right Heart Catheterization  Operator: Lauree Chandler, MD  Indication:  62 yo male with history of cardiomyopathy, CHF, right sided heart failure, DM, HTN, HLD, tobacco abuse admitted with right sided heart failure.                                 Procedure Details: The risks, benefits, complications, treatment options, and expected outcomes were discussed with the patient. The patient and/or family concurred with the proposed plan, giving informed consent. The patient was brought to the cath lab after IV hydration was begun and oral premedication was given. The patient was further sedated with Versed and Fentanyl. The right groin was prepped and draped in the usual manner. Using the modified Seldinger access technique, a 5 French sheath was placed in the femoral artery. A 6 French sheath was inserted into the right femoral vein. A multi-purpose catheter was used to perform a right heart catheterization. Standard diagnostic catheters were used to perform selective coronary angiography. A pigtail catheter was used to measure left ventricular pressures. There were no immediate complications. The patient was taken to the recovery area in stable condition.   Hemodynamic Findings: Ao: 105/59               LV: 110/10/19 RA:  13            RV: 42/5//13 PA:  47/15 (mean 31)      PCWP: 19 Fick Cardiac Output: 10 L/min Fick Cardiac Index: 4.86 L/min/m2 Central Aortic Saturation: 87% Pulmonary Artery Saturation: 68%  Angiographic Findings:  Left main:  10% stenosis.   Left Anterior Descending Artery: Large caliber vessel that courses to the apex. Diffuse 40% proximal stenosis. The mid vessel has a focal 70% stenosis. The distal vessel has diffuse mild plaque. The diagonal branch is moderate  in caliber, 50% proximal stenosis.   Circumflex Artery: Moderate caliber vessel that has diffuse 60% proximal stenosis. The mid vessel has 99% stenosis followed by a 100% occlusion. The small to moderate caliber first obtuse marginal branch has diffuse 50% stenosis. The moderate caliber second obtuse marginal branch fills from left to left collaterals.   Right Coronary Artery: Large dominant vessel with 100% proximal occlusion. The mid, distal vessel fills from left to right collaterals.   Left Ventricular Angiogram: Deferred.   Impression: 1. 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 NSTEMI 3. LV systolic dysfunction.    Recommendations: Continue medical management of his CHF, cardiomyopathy and CAD. After volume status is optimized, will need CT surgery consult for CABG.        Complications:  None; patient tolerated the procedure well.

## 2013-04-28 NOTE — Progress Notes (Signed)
Physical Therapy Treatment Patient Details Name: [name] [name] MRN: [id number] DOB: 07-23-1950 Today's Date: 04/28/2013 Time: [id number] PT Time Calculation (min): 34 min  PT Assessment / Plan / Recommendation  History of Present Illness [name] [name] is a 62 y.o. male presenting with abdominal discomfort and distention, found to have AKI and markedly elevated LFT's . PMH is significant for CHF, COPD, HTN, and tobacco abuse. Pt with heart and liver failure.  Patient s/p card cath 11/7 - severe 3-vessel disease, NSTEMI, LV systolic dysfunction.   PT Comments   Patient making good progress with mobility and gait.  Follow Up Recommendations  Home health PT;Supervision - Intermittent     Does the patient have the potential to tolerate intense rehabilitation     Barriers to Discharge        Equipment Recommendations  None recommended by PT    Recommendations for Other Services    Frequency Min 3X/week   Progress towards PT Goals Progress towards PT goals: Progressing toward goals  Plan Current plan remains appropriate    Precautions / Restrictions Precautions Precautions: Fall Restrictions Weight Bearing Restrictions: No   Pertinent Vitals/Pain     Mobility  Bed Mobility Bed Mobility: Supine to Sit;Sitting - Scoot to Edge of Bed Supine to Sit: 6: Modified independent (Device/Increase time);With rails Sitting - Scoot to Edge of Bed: 6: Modified independent (Device/Increase time);With rail Details for Bed Mobility Assistance: Verbal cues for technique.   Transfers Transfers: Sit to Stand;Stand to Sit Sit to Stand: 4: Min guard;With upper extremity assist;From bed Stand to Sit: 5: Supervision;With upper extremity assist;With armrests;To chair/3-in-1 Details for Transfer Assistance: Verbal cues for hand placement Ambulation/Gait Ambulation/Gait Assistance: 5: Supervision Ambulation Distance (Feet): 400 Feet Assistive device: Rolling walker Ambulation/Gait Assistance Details: Verbal  cues to stay close to RW and to move at safe speed. Gait Pattern: Step-through pattern;Decreased stride length Gait velocity: decreased General Gait Details: Encouraged patient to move slowly following card cath today.  Condom cath fell off during gait.  Patient (and floor) cleaned once returned to room.   Patient stood in bathroom to void with min guard for balance.      PT Goals (current goals can now be found in the care plan section)    Visit Information  Last PT Received On: 04/28/13 Assistance Needed: +1 History of Present Illness: [name] [name] is a 63 y.o. male presenting with abdominal discomfort and distention, found to have AKI and markedly elevated LFT's . PMH is significant for CHF, COPD, HTN, and tobacco abuse. Pt with heart and liver failure.  Patient s/p card cath 11/7 - severe 3-vessel disease, NSTEMI, LV systolic dysfunction.    Subjective Data  Subjective: "It's hard to lay still that long (referring to bedrest following cath)   Cognition  Cognition Arousal/Alertness: Awake/alert Behavior During Therapy: WFL for tasks assessed/performed Overall Cognitive Status: Within Functional Limits for tasks assessed    Balance  Balance Balance Assessed: Yes Dynamic Sitting Balance Dynamic Sitting - Balance Support: No upper extremity supported;Feet supported Dynamic Sitting - Level of Assistance: 5: Stand by assistance Dynamic Sitting - Comments: Patient able to lean forward and don socks on both feet  End of Session PT - End of Session Equipment Utilized During Treatment: Gait belt Activity Tolerance: Patient tolerated treatment well Patient left: in chair;with call bell/phone within reach Nurse Communication: Mobility status (Condom cath off; patient cleaned and in chair)   GP     Despina Pole 04/28/2013, 5:05 PM Carita Pian. Rosana Hoes, PT,  [place] Pager [phone]

## 2013-04-28 NOTE — Progress Notes (Signed)
FMTS Attending Note Patient's care discussed with resident team, I agree with Dr Lisbeth Ply assessment and plan per his note.  Patient with severe 3 vessel disease on cath.  Improvement in transaminases and INR.   Plan to continue with diuresis.  Heart Failure Team recommendation for oral demadex and low-dose statin.  Cardiothoracic surgery consulted for consideration of CABG. Dalbert Mayotte, MD

## 2013-04-28 NOTE — Interval H&P Note (Signed)
History and Physical Interval Note:  04/28/2013 7:36 AM  [name] [name]  has presented today for cardiac cath with the diagnosis of CHF, cardiomyopathy, NSTEMI. The various methods of treatment have been discussed with the patient and family. After consideration of risks, benefits and other options for treatment, the patient has consented to  Procedure(s): LEFT AND RIGHT HEART CATHETERIZATION WITH CORONARY ANGIOGRAM (N/A) as a surgical intervention .  The patient's history has been reviewed, patient examined, no change in status, stable for surgery.  I have reviewed the patient's chart and labs.  Questions were answered to the patient's satisfaction.    Cath Lab Visit (complete for each Cath Lab visit)  Clinical Evaluation Leading to the Procedure:   ACS: yes  Non-ACS:    Anginal Classification: CCS III  Anti-ischemic medical therapy: Minimal Therapy (1 class of medications)  Non-Invasive Test Results: No non-invasive testing performed  Prior CABG: No previous CABG        [name] [name]

## 2013-04-29 ENCOUNTER — Inpatient Hospital Stay (HOSPITAL_COMMUNITY): Payer: Medicaid Other

## 2013-04-29 LAB — COMPREHENSIVE METABOLIC PANEL
ALT: 344 U/L — ABNORMAL HIGH (ref 0–53)
BUN: 16 mg/dL (ref 6–23)
Calcium: 8.9 mg/dL (ref 8.4–10.5)
Creatinine, Ser: 1.03 mg/dL (ref 0.50–1.35)
GFR calc Af Amer: 89 mL/min — ABNORMAL LOW (ref >90)
GFR calc non Af Amer: 76 mL/min — ABNORMAL LOW (ref >90)
Glucose, Bld: 84 mg/dL (ref 70–99)
Sodium: 135 mEq/L (ref 135–145)
Total Protein: 6 g/dL (ref 6.0–8.3)

## 2013-04-29 LAB — CULTURE, BLOOD (ROUTINE X 2)
Culture: NO GROWTH
Culture: NO GROWTH

## 2013-04-29 MED ORDER — HEPARIN SODIUM (PORCINE) 5000 UNIT/ML IJ SOLN
5000.0000 [IU] | Freq: Three times a day (TID) | INTRAMUSCULAR | Status: AC
  Administered 2013-04-29 – 2013-05-02 (×9): 5000 [IU] via SUBCUTANEOUS
  Filled 2013-04-29 (×12): qty 1

## 2013-04-29 NOTE — Progress Notes (Signed)
CARDIAC REHAB PHASE I   PRE:  Rate/Rhythm: 82SR  BP:  Supine:   Sitting: 95/50  Standing:    SaO2: 100%RA  MODE:  Ambulation: 460 ft   POST:  Rate/Rhythm: 85SR  BP:  Supine:   Sitting: 123/78  Standing:    SaO2: 98%RA 1040-1115 Pt walked 460 ft on RA with rolling walker with steady gait. No DOE. Tolerated well. Stated he watched preop video with sister. Gave [facility] booklet. Will continue to see.   Graylon Good, RN [name]  04/29/2013 11:10 AM

## 2013-04-29 NOTE — Progress Notes (Signed)
Pt a/o, no c/o pain, pt ambulated in hallway with PT, tolerated, pts right femoral site is level 0, will continue to monitor

## 2013-04-29 NOTE — Progress Notes (Signed)
Family Medicine Teaching Service Daily Progress Note Intern Pager: [phone]  Patient name: Aydian Eavey Medical record number: [id number] Date of birth: 17-Jun-1951 Age: 62 y.o. Gender: male  Primary Care Provider: Fae Pippin Consultants: none Code Status: Full  Pt Overview and Major Events to Date:  04/23/13: Increased LFT's from previous day 04/26/13: Hepatic u/s shows no hepatic/portal vein clots. 04/28/13: cardiac cath shows triple vessel disease  Assessment and Plan: Nicole Mcgovern is a 62 y.o. male presenting with abdominal discomfort and distention, found to have AKI and markedly elevated LFT's . PMH is significant for CHF, COPD, HTN, and tobacco abuse. Currently improving.  # Transaminitis with elevated bilirubin, mild ascites: repeat LFT's today trending down with AST/ALT: 126/344 and tbili of 2.9. Appears to be more related to RHF. INR trending down and currently 1.42. GI signed off. Appreciated their help. He is improving overall - hold home colchicine, amitiza and allopurinol - follow-up daily INR and hepatic function - PT recommends home health PT - start heparin subq for VTE prophylaxis since INR decreasing  # AKI, metabolic acidosis, elevated lactic acid (Resolved): Acidosis has resolved. Lactic acid decreased to 1.3. Creatinine trending down, today 1.03  # CHF: EF: 20-25% which has worsened from previous 45-50% in 2012. PA pressure: 95mmHg. Weight on admission: 215lbs. Currently diuresing well. Current weight is 194lbs. 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 - UOP in last 24 hours: 4.3L (~61ml/kg/hr). CT surgery is following and will determine patient's candidacy for CABG. - Weight today is 194lbs - cardiology recs:  - discontinue metoprolol  - continue carvedilol  - continue lisinopril  - continue digoxin  - continue lasix 40mg  IV BID - follow-up cardiology recommendations - follow-up CT surgery recommendations  # SOB:  Resolved. Most likely due to CHF. - discontinue antibiotics - strict I&O, daily weights  - albuterol, spiriva - continue to monitor on telemetry   # Hypokalemia: Resolved  # Right foot wound: being managed by wound care as an outpatient.  - wound care in hospital  # Right leg swelling and erythema: appears to be resolved  # Diabetes: pt reports he has DM, not on any medications per med list. CBG's in 70's-100s. A1C is 5.7  # Gout: hold colchicine, hold allopurinol  # Tobacco abuse: will provide nicotine patch per pt request   FEN/GI: carb modified diet Prophylaxis: heparin SQ   Disposition: pending recommendations from CT surgery  Subjective: No problems overnight. He continues to feel better overall. Has been increasing his ambulation and is not having shortness of breath.  Objective: Temp:  [97.9 F (36.6 C)-98.9 F (37.2 C)] 98.3 F (36.8 C) (11/08 0459) Pulse Rate:  [70-86] 86 (11/08 0459) Resp:  [16-20] 16 (11/08 0459) BP: (101-117)/(60-73) 103/68 mmHg (11/08 0459) SpO2:  [97 %-100 %] 97 % (11/08 0459) Weight:  [194 lb 1.6 oz (88.043 kg)] 194 lb 1.6 oz (88.043 kg) (11/08 0459)  Physical Exam: General: no acute distress, comfortable in bed. Just woke up and pleasant HEENT: poor dentition Cardiovascular: S1S2, RRR, no murmur Respiratory: CTA, normal work of breathing, no crackles appreciated Abdomen: soft, non tender, distended but improved from prior exams, +BS Extremities: Non-erythematous bilaterally, legs equal in warmth, no tenderness, 1+ edema bilaterally Neuro: Alert and oriented x3  Laboratory:  Recent Labs Lab 04/25/13 0512 04/27/13 0700 04/28/13 0525  WBC 11.3* 8.1 8.6  HGB 10.7* 10.8* 10.6*  HCT 33.7* 32.6* 32.8*  PLT 176 159 167    Recent Labs  Lab 04/27/13 0600 04/27/13 0700 04/28/13 0525 04/29/13 0433  NA 139  --  139 135  K 3.4*  --  3.6 3.7  CL 102  --  102 97  CO2 29  --  29 29  BUN 28*  --  20 16  CREATININE 1.24  --  1.07 1.03   CALCIUM 8.8  --  8.9 8.9  PROT  --  5.9* 5.9* 6.0  BILITOT  --  3.7* 2.9* 2.9*  ALKPHOS  --  74 77 86  ALT  --  600* 441* 344*  AST  --  343* 199* 126*  GLUCOSE 82  --  76 84     04/27/2013 06:00 04/28/2013 05:25 04/29/2013 04:33  INR 2.16 (H) 1.68 (H) 1.42    Imaging/Diagnostic Tests: No recent imaging  Medications: Scheduled Meds: . aspirin EC  81 mg Oral Daily  . carvedilol  3.125 mg Oral BID WC  . Chlorhexidine Gluconate Cloth  6 each Topical Q0600  . digoxin  0.125 mg Oral Daily  . furosemide  40 mg Intravenous BID  . lisinopril  2.5 mg Oral Daily  . mupirocin ointment  1 application Nasal BID  . nicotine  7 mg Transdermal Daily  . pantoprazole  40 mg Oral Daily  . polyethylene glycol  17 g Oral Daily  . simvastatin  20 mg Oral q1800  . sodium chloride  3 mL Intravenous Q12H  . tiotropium  18 mcg Inhalation Daily   Continuous Infusions:  PRN Meds:.acetaminophen, albuterol, ondansetron (ZOFRAN) IV, ondansetron   Cordelia Poche, MD 04/29/2013, 6:43 AM PGY-1, [place] Intern pager: [phone], text pages welcome

## 2013-04-29 NOTE — Progress Notes (Signed)
Patient ID: [name] [name], male   DOB: 1950/08/25, 62 y.o.   MRN: [id number]   SUBJECTIVE: Feels ok. Denies CP or dyspnea.  I/O - 6.7 liters over the past several days.  Cath shows  severe 3V CAD. RHC as below  Ao: 105/59  LV: 110/10/19  RA: 13  RV: 42/5//13  PA: 47/15 (mean 31)  PCWP: 19  Fick Cardiac Output: 10 L/min  Fick Cardiac Index: 4.86 L/min/m2  PVR: 1.2 WU Central Aortic Saturation: 87%  Pulmonary Artery Saturation: 68%  Medications   . aspirin EC  81 mg Oral Daily  . carvedilol  3.125 mg Oral BID WC  . Chlorhexidine Gluconate Cloth  6 each Topical Q0600  . digoxin  0.125 mg Oral Daily  . furosemide  40 mg Intravenous BID  . lisinopril  2.5 mg Oral Daily  . mupirocin ointment  1 application Nasal BID  . nicotine  7 mg Transdermal Daily  . pantoprazole  40 mg Oral Daily  . polyethylene glycol  17 g Oral Daily  . simvastatin  20 mg Oral q1800  . sodium chloride  3 mL Intravenous Q12H  . tiotropium  18 mcg Inhalation Daily     Filed Vitals:   04/29/13 0143 04/29/13 0459 04/29/13 0730 04/29/13 0934  BP: 112/65 103/68  102/61  Pulse: 79 86  83  Temp: 98.9 F (37.2 C) 98.3 F (36.8 C)    TempSrc: Oral Oral    Resp: 18 16    Height:      Weight:  194 lb 1.6 oz (88.043 kg)    SpO2: 100% 97% 100%     Intake/Output Summary (Last 24 hours) at 04/29/13 1116 Last data filed at 04/29/13 1020  Gross per 24 hour  Intake   1280 ml  Output   4475 ml  Net  -3195 ml    LABS: Basic Metabolic Panel:  Recent Labs  04/28/13 0525 04/29/13 0433  NA 139 135  K 3.6 3.7  CL 102 97  CO2 29 29  GLUCOSE 76 84  BUN 20 16  CREATININE 1.07 1.03  CALCIUM 8.9 8.9   Liver Function Tests:  Recent Labs  04/28/13 0525 04/29/13 0433  AST 199* 126*  ALT 441* 344*  ALKPHOS 77 86  BILITOT 2.9* 2.9*  PROT 5.9* 6.0  ALBUMIN 2.5* 2.7*   No results found for this basename: LIPASE, AMYLASE,  in the last 72 hours CBC:  Recent Labs  04/27/13 0700 04/28/13 0525  WBC  8.1 8.6  HGB 10.8* 10.6*  HCT 32.6* 32.8*  MCV 75.8* 75.6*  PLT 159 167    RADIOLOGY: Ct Abdomen Pelvis Wo Contrast  04/22/2013   CLINICAL DATA:  Abdominal pain and distention.  EXAM: CT ABDOMEN AND PELVIS WITHOUT CONTRAST  TECHNIQUE: Multidetector CT imaging of the abdomen and pelvis was performed following the standard protocol without intravenous contrast.  COMPARISON:  04/21/2013  FINDINGS: Mild ascites again seen within the abdomen and pelvis, without significant change. The liver, gallbladder, spleen, pancreas, adrenal glands, and kidneys are unremarkable in appearance except for small right renal cyst. Vicarious excretion of contrast noted in the gallbladder as well as mild residual contrast enhancement of both kidneys from the most recent CT. No soft tissue masses or lymphadenopathy identified within the abdomen or pelvis. Residual contrast seen in the urinary bladder.  Diverticulosis is seen mainly involving the sigmoid colon, however there is no evidence of diverticulitis. No focal inflammatory process or abscess identified. No evidence of bowel  obstruction or hernia.  IMPRESSION: Mild ascites, without significant change since prior exam.  Diverticulosis. No radiographic evidence of diverticulitis.   Electronically Signed   By: Earle Gell M.D.   On: 04/22/2013 20:39   Dg Chest 2 View  04/22/2013   CLINICAL DATA:  Chest pain and vomiting  EXAM: CHEST  2 VIEW  COMPARISON:  04/21/2013  FINDINGS: The heart is again enlarged in size but stable. The lungs are well aerated bilaterally. Some very mild patchy changes are noted in the right upper lobe which may represent some early infiltrate.  IMPRESSION: Stable cardiomegaly.  New patchy changes in the right upper lobe which may represent an early infiltrate.   Electronically Signed   By: Inez Catalina M.D.   On: 04/22/2013 14:51   US Abdomen Complete  04/22/2013   CLINICAL DATA:  Abdominal pain.  EXAM: ULTRASOUND ABDOMEN COMPLETE  COMPARISON:  None.   FINDINGS: Gallbladder  No gallstones identified. Mild diffuse gallbladder wall thickening seen measuring 4 mm, however no sonographic Murphy sign was noted.  Common bile duct  Diameter: 3 mm  Liver  No focal lesion identified. Within normal limits in parenchymal echogenicity. Mild perihepatic ascites noted.  IVC  No abnormality visualized.  Pancreas  Visualized portion unremarkable.  Spleen  Size and appearance within normal limits.  Right Kidney  Length: 10.3 cm. Echogenicity within normal limits. No mass or hydronephrosis visualized.  Left Kidney  Length: 10.4 cm. Echogenicity within normal limits. No mass or hydronephrosis visualized.  Abdominal aorta  No aneurysm visualized.  IMPRESSION: No evidence of gallstones or biliary dilatation.  Mild diffuse gallbladder wall thickening, which is a nonspecific finding. Mild ascites also noted.  Unremarkable sonographic appearance of the liver.   Electronically Signed   By: Earle Gell M.D.   On: 04/22/2013 16:46   Dg Chest Port 1 View  04/23/2013   CLINICAL DATA:  62 year old male with reason chest pain and vomiting. Fluid status.  EXAM: PORTABLE CHEST - 1 VIEW  COMPARISON:  04/22/2013 and earlier.  FINDINGS: Portable AP semi upright view at 2056 hrs. Stable cardiomegaly and mediastinal contours. Previously seen subtle patchy right lung opacity does not persist. No pulmonary edema, pleural effusion, pneumothorax, or consolidation. Stable visualized osseous structures.  IMPRESSION: Interval resolved nonspecific patchy pulmonary opacity. Stable cardiomegaly and no acute cardiopulmonary abnormality.   Electronically Signed   By: Lars Pinks M.D.   On: 04/23/2013 21:09    PHYSICAL EXAM General: NAD HEENT: normal except for poor dentition Neck: no significant JVP  , no thyromegaly or thyroid nodule.  Lungs: fairly clear CV: Nondisplaced PMI.  Heart regular S1/S2, no S3/S4, 2/6 HSM LLSB.  No edema this am  .  No carotid bruit.   Abdomen: Soft, nontender, no  hepatosplenomegaly mild distention (improving).  Neurologic: CN intace, moves arms and legs.  Psych: Normal affect. Extremities: No clubbing or cyanosis.   TELEMETRY: Reviewed telemetry pt in NSR  ASSESSMENT AND PLAN: 62 yo with history of COPD, diastolic CHF, and HTN presented with abdominal distention and elevated LFTs in setting of ADHF  Echo   showed EF 20-25% with moderate RV dysfunction.  1. Elevated LFTs: Improving. likely due to HF. May have component of fatty liver. Hepatitis panels negative.  2. Acute on chronic systolic CHF: EF 0000000 on echo with moderate RV dysfunction 3. AKI on probable CKD.  Resolved, creatinine down to 1.03 with diuresis.   4. 3V CAD, has been seen by TCTS.  Probable CABG next  week.     Darden Amber. 04/29/2013 11:16 AM

## 2013-04-29 NOTE — Progress Notes (Signed)
Resting comfortably No chest pain or SOB  BP 102/61  Pulse 83  Temp(Src) 98.3 F (36.8 C) (Oral)  Resp 16  Ht 5' 7.5" (1.715 m)  Wt 194 lb 1.6 oz (88.043 kg)  BMI 29.93 kg/m2  SpO2 100%   Intake/Output Summary (Last 24 hours) at 04/29/13 1142 Last data filed at 04/29/13 1020  Gross per 24 hour  Intake   1280 ml  Output   4025 ml  Net  -2745 ml   Exam unchanged  Labs continue to improve  Will need dental consult- will place on Monday

## 2013-04-29 NOTE — Progress Notes (Signed)
FMTS Attending Note Patient seen and examined by me this morning; he reports feeling much improved.  Transaminases are continuing to come down, as is his INR (1.4 this morning).  Appreciate Cardiology and Cardiothoracic Surgery input, plan for CABG likely next week.  Now that INR is down (and continuing to drop), to address VTE prophylaxis (UFH or Lovenox). Dalbert Mayotte, MD

## 2013-04-30 DIAGNOSIS — Z01810 Encounter for preprocedural cardiovascular examination: Secondary | ICD-10-CM

## 2013-04-30 DIAGNOSIS — I2510 Atherosclerotic heart disease of native coronary artery without angina pectoris: Secondary | ICD-10-CM

## 2013-04-30 LAB — CULTURE, BLOOD (ROUTINE X 2)

## 2013-04-30 LAB — PROTIME-INR
INR: 1.27 (ref 0.00–1.49)
Prothrombin Time: 15.6 seconds — ABNORMAL HIGH (ref 11.6–15.2)

## 2013-04-30 MED ORDER — FUROSEMIDE 80 MG PO TABS
80.0000 mg | ORAL_TABLET | Freq: Two times a day (BID) | ORAL | Status: DC
  Administered 2013-04-30 – 2013-05-01 (×2): 80 mg via ORAL
  Filled 2013-04-30 (×4): qty 1

## 2013-04-30 MED ORDER — CARVEDILOL 6.25 MG PO TABS
6.2500 mg | ORAL_TABLET | Freq: Two times a day (BID) | ORAL | Status: DC
  Administered 2013-04-30 – 2013-05-04 (×9): 6.25 mg via ORAL
  Filled 2013-04-30 (×12): qty 1

## 2013-04-30 NOTE — Progress Notes (Signed)
Family Medicine Teaching Service Daily Progress Note Intern Pager: [phone]  Patient name: [name] [name] Medical record number: [id number] Date of birth: 05/28/1951 Age: 62 y.o. Gender: male  Primary Care Provider: Fae Pippin Consultants: none Code Status: Full  Pt Overview and Major Events to Date:  04/23/13: Increased LFT's from previous day 04/26/13: Hepatic u/s shows no hepatic/portal vein clots. 04/28/13: cardiac cath shows triple vessel disease  Assessment and Plan: [name] [name] is a 62 y.o. male presenting with abdominal discomfort and distention, found to have AKI and markedly elevated LFT's . PMH is significant for CHF, COPD, HTN, and tobacco abuse. Currently improving.  # Transaminitis with elevated bilirubin, mild ascites: repeat LFT's trending down with AST/ALT: 126/344 (11/8) and tbili of 2.9. Appears to be more related to RHF. INR trending down and currently 1.42. GI signed off. Appreciated their help. He is improving overall - hold home colchicine, amitiza and allopurinol - follow-up daily INR and hepatic function - PT recommends home health PT - heparin subq for VTE prophylaxis since INR decreasing  # CHF: EF: 20-25% which has worsened from previous 45-50% in 2012. PA pressure: 54mmHg. Weight on admission: 215lbs. Currently diuresing well. 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 - UOP in last 24 hours: 4.2L (~70ml/kg/hr).  - Weight today is 191lbs (down 24 lbs from admit) - cardiology recs:  - discontinue metoprolol  - continue carvedilol: Inc to 6.25 BID (11/9)  - continue lisinopril: 2.5mg  qd Consider Inc if BP will tolerate tomorrow   - continue digoxin: Discuss discontinuing with Cardiology in order to maximize BB, ACE, and add Spironolactone    - lasix 40mg  IV BID - Switch to PO 80mg  BID today - follow-up cardiology recommendations: Probable CABG  - Cardiac Rehab following - follow-up CT surgery recommendations  - CT  surgery is following and will determine patient's candidacy for CABG. - Check Lipids in am: Consider switching Zocor to lipitor pending results   # Tooth pain - Poor dentition  - DG Orthopantogram Poor dentition Lucency along the root of the right upper central incisor and cuspid  (teeth #8 and 6), likely corresponding to reported loosening - Will consult Dental on 11/10  # SOB: Resolved. Most likely due to CHF. - antibiotics stopped - strict I&O, daily weights  - albuterol, spiriva - continue to monitor on telemetry   # AKI, metabolic acidosis, elevated lactic acid (Resolved): Acidosis has resolved. Lactic acid decreased to 1.3. - Creatinine trending down 1.03 (11/8)  # Hypokalemia: Resolved  # Right foot wound: being managed by wound care as an outpatient.  - wound care in hospital  # Right leg swelling and erythema: appears to be resolved  # Diabetes: pt reports he has DM, not on any medications per med list. CBG's in 70's-100s. A1C is 5.7  # Gout: hold colchicine, hold allopurinol  # Tobacco abuse: will provide nicotine patch per pt request   FEN/GI: carb modified diet Prophylaxis: heparin SQ   Disposition: pending recommendations from CT surgery  Subjective: No problems or complaints overnight. He continues to feel better overall. Has been increasing his ambulation and is not having shortness of breath. Denies CP.  Objective: Temp:  [98 F (36.7 C)-99 F (37.2 C)] 98.5 F (36.9 C) (11/09 0456) Pulse Rate:  [79-90] 90 (11/09 0456) Resp:  [17-20] 20 (11/09 0456) BP: (100-135)/(54-83) 135/83 mmHg (11/09 0456) SpO2:  [96 %-98 %] 98 % (11/09 0456) Weight:  [191 lb 9.3 oz (86.9 kg)]  191 lb 9.3 oz (86.9 kg) (11/09 0456)  Physical Exam: General: no acute distress, comfortable in bed.  HEENT: poor dentition Cardiovascular: S1S2, RRR, no murmur; No Hepatojugular reflex, No JVD Respiratory: CTA, normal work of breathing, no crackles appreciated Abdomen: soft, non  tender, distended consistent prior exams, +BS Extremities: Non-erythematous bilaterally, legs equal in warmth, no tenderness, 1+ edema bilaterally Neuro: Alert and oriented x3 Foot: 2.5 cm ulcer on Rt foot (healng well)  Laboratory:  Recent Labs Lab 04/25/13 0512 04/27/13 0700 04/28/13 0525  WBC 11.3* 8.1 8.6  HGB 10.7* 10.8* 10.6*  HCT 33.7* 32.6* 32.8*  PLT 176 159 167    Recent Labs Lab 04/27/13 0600 04/27/13 0700 04/28/13 0525 04/29/13 0433  NA 139  --  139 135  K 3.4*  --  3.6 3.7  CL 102  --  102 97  CO2 29  --  29 29  BUN 28*  --  20 16  CREATININE 1.24  --  1.07 1.03  CALCIUM 8.8  --  8.9 8.9  PROT  --  5.9* 5.9* 6.0  BILITOT  --  3.7* 2.9* 2.9*  ALKPHOS  --  74 77 86  ALT  --  600* 441* 344*  AST  --  343* 199* 126*  GLUCOSE 82  --  76 84    Recent Labs Lab 04/26/13 0454 04/27/13 0600 04/28/13 0525 04/29/13 0433 04/30/13 0411  INR 1.96* 2.16* 1.68* 1.42 1.27    Imaging/Diagnostic Tests: DG Orthopantogram IMPRESSION:  Poor dentition with numerous missing teeth.  Lucency along the root of the right upper central incisor and cuspid  (teeth #8 and 6), likely corresponding to reported loosening.   Medications: Scheduled Meds: . aspirin EC  81 mg Oral Daily  . carvedilol  3.125 mg Oral BID WC  . Chlorhexidine Gluconate Cloth  6 each Topical Q0600  . digoxin  0.125 mg Oral Daily  . furosemide  40 mg Intravenous BID  . heparin subcutaneous  5,000 Units Subcutaneous Q8H  . lisinopril  2.5 mg Oral Daily  . mupirocin ointment  1 application Nasal BID  . nicotine  7 mg Transdermal Daily  . pantoprazole  40 mg Oral Daily  . polyethylene glycol  17 g Oral Daily  . simvastatin  20 mg Oral q1800  . sodium chloride  3 mL Intravenous Q12H  . tiotropium  18 mcg Inhalation Daily   Continuous Infusions:  PRN Meds:.acetaminophen, albuterol, ondansetron (ZOFRAN) IV, ondansetron   Phill Myron, MD 04/30/2013, 8:37 AM PGY-1, [place] Intern pager: [phone], text pages welcome

## 2013-04-30 NOTE — Progress Notes (Addendum)
Pre-op Cardiac Surgery  Carotid Findings:  1-39% ICA stenosis.  Vertebral artery flow is antegrade.  Upper Extremity Right Left  Brachial Pressures 114-Triphasic 130-Triphasic  Radial Waveforms Biphasic Biphasic  Ulnar Waveforms Biphasic Biphasic  Palmar Arch (Allen's Test) Signal obliterates with radial compression, is unaffected with ulnar compression. Signal obliterates with radial compression, decreases >50% with ulnar compression.    Lower  Extremity Right Left  Dorsalis Pedis 53-Monophasic 88-Monophasic  Anterior Tibial    Posterior Tibial 74-Monophasic 81-Monophasic  Ankle/Brachial Indices 0.57 0.68    Findings:   Bilateral ABIs are suggestive of moderate arterial insufficiency. There is a difference of 47mmHg between the brachial arteries, suggestive of a possible steal syndrome.   05/02/2013 2:18 PM Maudry Mayhew, RVT, RDCS, RDMS

## 2013-04-30 NOTE — Progress Notes (Signed)
FMTS Attending Note Patient seen and examined by me today, discussed with resident team and I agree with Dr Milagros Reap assessment and plan.  Dalbert Mayotte, MD

## 2013-04-30 NOTE — Progress Notes (Signed)
Patient ID: [name] [name], male   DOB: [birth year], 62 y.o.   MRN: [id number]   SUBJECTIVE: Feels ok. Denies CP or dyspnea.  I/O - 6.7 liters over the past several days.  Cath shows  severe 3V CAD. RHC as below  Ao: 105/59  LV: 110/10/19  RA: 13  RV: 42/5//13  PA: 47/15 (mean 31)  PCWP: 19  Fick Cardiac Output: 10 L/min  Fick Cardiac Index: 4.86 L/min/m2  PVR: 1.2 WU Central Aortic Saturation: 87%  Pulmonary Artery Saturation: 68%  Medications   . aspirin EC  81 mg Oral Daily  . carvedilol  3.125 mg Oral BID WC  . Chlorhexidine Gluconate Cloth  6 each Topical Q0600  . digoxin  0.125 mg Oral Daily  . furosemide  40 mg Intravenous BID  . heparin subcutaneous  5,000 Units Subcutaneous Q8H  . lisinopril  2.5 mg Oral Daily  . mupirocin ointment  1 application Nasal BID  . nicotine  7 mg Transdermal Daily  . pantoprazole  40 mg Oral Daily  . polyethylene glycol  17 g Oral Daily  . simvastatin  20 mg Oral q1800  . sodium chloride  3 mL Intravenous Q12H  . tiotropium  18 mcg Inhalation Daily     Filed Vitals:   04/29/13 1415 04/29/13 2036 04/30/13 0456 04/30/13 0941  BP: 107/64 100/54 135/83 124/79  Pulse: 79 89 90 92  Temp: 98 F (36.7 C) 99 F (37.2 C) 98.5 F (36.9 C) 98 F (36.7 C)  TempSrc: Oral Oral Oral Oral  Resp: 17 18 20 20   Height:      Weight:   191 lb 9.3 oz (86.9 kg)   SpO2: 98% 96% 98% 97%    Intake/Output Summary (Last 24 hours) at 04/30/13 1013 Last data filed at 04/30/13 0943  Gross per 24 hour  Intake   1403 ml  Output   3277 ml  Net  -1874 ml    LABS: Basic Metabolic Panel:  Recent Labs  04/28/13 0525 04/29/13 0433  NA 139 135  K 3.6 3.7  CL 102 97  CO2 29 29  GLUCOSE 76 84  BUN 20 16  CREATININE 1.07 1.03  CALCIUM 8.9 8.9   Liver Function Tests:  Recent Labs  04/28/13 0525 04/29/13 0433  AST 199* 126*  ALT 441* 344*  ALKPHOS 77 86  BILITOT 2.9* 2.9*  PROT 5.9* 6.0  ALBUMIN 2.5* 2.7*   No results found for this  basename: LIPASE, AMYLASE,  in the last 72 hours CBC:  Recent Labs  04/28/13 0525  WBC 8.6  HGB 10.6*  HCT 32.8*  MCV 75.6*  PLT 167    RADIOLOGY: Ct Abdomen Pelvis Wo Contrast  04/22/2013   CLINICAL DATA:  Abdominal pain and distention.  EXAM: CT ABDOMEN AND PELVIS WITHOUT CONTRAST  TECHNIQUE: Multidetector CT imaging of the abdomen and pelvis was performed following the standard protocol without intravenous contrast.  COMPARISON:  04/21/2013  FINDINGS: Mild ascites again seen within the abdomen and pelvis, without significant change. The liver, gallbladder, spleen, pancreas, adrenal glands, and kidneys are unremarkable in appearance except for small right renal cyst. Vicarious excretion of contrast noted in the gallbladder as well as mild residual contrast enhancement of both kidneys from the most recent CT. No soft tissue masses or lymphadenopathy identified within the abdomen or pelvis. Residual contrast seen in the urinary bladder.  Diverticulosis is seen mainly involving the sigmoid colon, however there is no evidence of diverticulitis. No focal inflammatory  process or abscess identified. No evidence of bowel obstruction or hernia.  IMPRESSION: Mild ascites, without significant change since prior exam.  Diverticulosis. No radiographic evidence of diverticulitis.   Electronically Signed   By: Earle Gell M.D.   On: 04/22/2013 20:39   Dg Chest 2 View  04/22/2013   CLINICAL DATA:  Chest pain and vomiting  EXAM: CHEST  2 VIEW  COMPARISON:  04/21/2013  FINDINGS: The heart is again enlarged in size but stable. The lungs are well aerated bilaterally. Some very mild patchy changes are noted in the right upper lobe which may represent some early infiltrate.  IMPRESSION: Stable cardiomegaly.  New patchy changes in the right upper lobe which may represent an early infiltrate.   Electronically Signed   By: Inez Catalina M.D.   On: 04/22/2013 14:51   US Abdomen Complete  04/22/2013   CLINICAL DATA:   Abdominal pain.  EXAM: ULTRASOUND ABDOMEN COMPLETE  COMPARISON:  None.  FINDINGS: Gallbladder  No gallstones identified. Mild diffuse gallbladder wall thickening seen measuring 4 mm, however no sonographic Murphy sign was noted.  Common bile duct  Diameter: 3 mm  Liver  No focal lesion identified. Within normal limits in parenchymal echogenicity. Mild perihepatic ascites noted.  IVC  No abnormality visualized.  Pancreas  Visualized portion unremarkable.  Spleen  Size and appearance within normal limits.  Right Kidney  Length: 10.3 cm. Echogenicity within normal limits. No mass or hydronephrosis visualized.  Left Kidney  Length: 10.4 cm. Echogenicity within normal limits. No mass or hydronephrosis visualized.  Abdominal aorta  No aneurysm visualized.  IMPRESSION: No evidence of gallstones or biliary dilatation.  Mild diffuse gallbladder wall thickening, which is a nonspecific finding. Mild ascites also noted.  Unremarkable sonographic appearance of the liver.   Electronically Signed   By: Earle Gell M.D.   On: 04/22/2013 16:46   Dg Chest Port 1 View  04/23/2013   CLINICAL DATA:  62 year old male with reason chest pain and vomiting. Fluid status.  EXAM: PORTABLE CHEST - 1 VIEW  COMPARISON:  04/22/2013 and earlier.  FINDINGS: Portable AP semi upright view at 2056 hrs. Stable cardiomegaly and mediastinal contours. Previously seen subtle patchy right lung opacity does not persist. No pulmonary edema, pleural effusion, pneumothorax, or consolidation. Stable visualized osseous structures.  IMPRESSION: Interval resolved nonspecific patchy pulmonary opacity. Stable cardiomegaly and no acute cardiopulmonary abnormality.   Electronically Signed   By: Lars Pinks M.D.   On: 04/23/2013 21:09    PHYSICAL EXAM General: NAD HEENT: normal except for poor dentition Neck: no significant JVP  , no thyromegaly or thyroid nodule.  Lungs: fairly clear CV: Nondisplaced PMI.  Heart regular S1/S2, no S3/S4, 2/6 HSM LLSB.  No edema  this am  .  No carotid bruit.   Abdomen: Soft, nontender, no hepatosplenomegaly mild distention (improving).  Neurologic: CN intace, moves arms and legs.  Psych: Normal affect. Extremities: No clubbing or cyanosis.   TELEMETRY: Reviewed telemetry pt in NSR  ASSESSMENT AND PLAN: 62 yo with history of COPD, diastolic CHF, and HTN presented with abdominal distention and elevated LFTs in setting of ADHF  Echo   showed EF 20-25% with moderate RV dysfunction.  1. Elevated LFTs: Improving. likely due to HF. May have component of fatty liver. Hepatitis panels negative.  2. Acute on chronic systolic CHF: EF 0000000 on echo with moderate RV dysfunction, discussed with family medicine resident - we will gradually increase his coreg and ACE-I .  Increase coreg today.  3. AKI on probable CKD.  Resolved, creatinine down to 1.03 with diuresis.   4. 3V CAD, has been seen by TCTS.  Probable CABG next week.     Darden Amber. 04/30/2013 10:13 AM

## 2013-05-01 ENCOUNTER — Encounter (HOSPITAL_COMMUNITY): Payer: Medicaid Other

## 2013-05-01 LAB — CBC
HCT: 37.7 % — ABNORMAL LOW (ref 39.0–52.0)
Hemoglobin: 12.2 g/dL — ABNORMAL LOW (ref 13.0–17.0)
MCH: 24.7 pg — ABNORMAL LOW (ref 26.0–34.0)
MCHC: 32.4 g/dL (ref 30.0–36.0)
RBC: 4.93 MIL/uL (ref 4.22–5.81)
RDW: 21.6 % — ABNORMAL HIGH (ref 11.5–15.5)

## 2013-05-01 LAB — FERRITIN: Ferritin: 79 ng/mL (ref 22–322)

## 2013-05-01 LAB — IRON AND TIBC
Iron: 56 ug/dL (ref 42–135)
Saturation Ratios: 30 % (ref 20–55)
UIBC: 128 ug/dL (ref 125–400)

## 2013-05-01 LAB — LIPID PANEL
Cholesterol: 98 mg/dL (ref 0–200)
HDL: 31 mg/dL — ABNORMAL LOW (ref >39)
LDL Cholesterol: 53 mg/dL (ref 0–99)

## 2013-05-01 LAB — COMPREHENSIVE METABOLIC PANEL
ALT: 190 U/L — ABNORMAL HIGH (ref 0–53)
AST: 51 U/L — ABNORMAL HIGH (ref 0–37)
Albumin: 3 g/dL — ABNORMAL LOW (ref 3.5–5.2)
CO2: 29 mEq/L (ref 19–32)
Calcium: 9 mg/dL (ref 8.4–10.5)
Creatinine, Ser: 0.98 mg/dL (ref 0.50–1.35)
GFR calc non Af Amer: 87 mL/min — ABNORMAL LOW (ref >90)
Glucose, Bld: 176 mg/dL — ABNORMAL HIGH (ref 70–99)
Potassium: 3.5 mEq/L (ref 3.5–5.1)
Sodium: 135 mEq/L (ref 135–145)
Total Protein: 7.6 g/dL (ref 6.0–8.3)

## 2013-05-01 LAB — PROTIME-INR
INR: 1.24 (ref 0.00–1.49)
Prothrombin Time: 15.3 seconds — ABNORMAL HIGH (ref 11.6–15.2)

## 2013-05-01 MED ORDER — FUROSEMIDE 40 MG PO TABS
60.0000 mg | ORAL_TABLET | Freq: Two times a day (BID) | ORAL | Status: DC
  Administered 2013-05-01 – 2013-05-04 (×6): 60 mg via ORAL
  Filled 2013-05-01 (×10): qty 1

## 2013-05-01 MED ORDER — LISINOPRIL 5 MG PO TABS
5.0000 mg | ORAL_TABLET | Freq: Every day | ORAL | Status: DC
  Administered 2013-05-01 – 2013-05-04 (×3): 5 mg via ORAL
  Filled 2013-05-01 (×5): qty 1

## 2013-05-01 NOTE — Progress Notes (Signed)
Nutrition Brief Note  Patient identified on the Malnutrition Screening Tool (MST) Report with a score of 2  Wt Readings from Last 15 Encounters:  05/01/13 186 lb 11.7 oz (84.7 kg)  05/01/13 186 lb 11.7 oz (84.7 kg)  05/01/13 186 lb 11.7 oz (84.7 kg)  05/20/11 229 lb 11.5 oz (104.2 kg)    Body mass index is 28.8 kg/(m^2). Patient meets criteria for overweight based on current BMI.   Current diet order is Carbohydrate Modified, patient is consuming approximately 75-100% of meals at this time. Labs and medications reviewed.   Patient has lost about 29 pounds since admission, but this is related to fluid loss secondary to diuresis. He is eating well.  No nutrition interventions warranted at this time. If nutrition issues arise, please consult RD.   Larey Seat, RD, LDN Pager #: [phone] After-Hours Pager #: [phone]

## 2013-05-01 NOTE — Progress Notes (Signed)
Attending Addendum  I examined the patient and discussed the assessment and plan with Dr. Lonny Prude. I have reviewed the note and agree.  Patient seen today. No complaints. No additions are addendums to the care plan. Agree with dental surgery evaluation in anticipation of CABG. Anticipated cardiology and cardiothoracic surgery care and recommendations.     [name] [name], [place]

## 2013-05-01 NOTE — Progress Notes (Signed)
Physical Therapy Treatment Patient Details Name: [name] [name] MRN: [id number] DOB: 1951-03-15 Today's Date: 05/01/2013 Time: [id number] PT Time Calculation (min): 23 min  PT Assessment / Plan / Recommendation  History of Present Illness [name] [name] is a 62 y.o. male presenting with abdominal discomfort and distention, found to have AKI and markedly elevated LFT's . PMH is significant for CHF, COPD, HTN, and tobacco abuse. Pt with heart and liver failure.  Patient s/p card cath 11/7 - severe 3-vessel disease, NSTEMI, LV systolic dysfunction.   PT Comments   Pt moves well.  Per chart review, plan is for CABG later in week.  Practiced sit<>stand transfers without UE use to prepare for after surgery.     Follow Up Recommendations  Home health PT;Supervision - Intermittent     Does the patient have the potential to tolerate intense rehabilitation     Barriers to Discharge        Equipment Recommendations  None recommended by PT    Recommendations for Other Services    Frequency Min 3X/week   Progress towards PT Goals Progress towards PT goals: Progressing toward goals  Plan Current plan remains appropriate    Precautions / Restrictions Precautions Precautions: Fall Restrictions Weight Bearing Restrictions: No       Mobility  Bed Mobility Bed Mobility: Supine to Sit;Sitting - Scoot to Edge of Bed Supine to Sit: 6: Modified independent (Device/Increase time) Sitting - Scoot to Edge of Bed: 6: Modified independent (Device/Increase time) Transfers Transfers: Sit to Stand;Stand to Sit Sit to Stand: 4: Min guard;4: Min assist;With upper extremity assist;Without upper extremity assist;From bed;From chair/3-in-1 Stand to Sit: 4: Min guard;Without upper extremity assist;With upper extremity assist;6: Modified independent (Device/Increase time);With armrests;To chair/3-in-1 Details for Transfer Assistance: Cues for safe hand placement.  Also practiced without UE use to prepare for after  CABG.  Pt required min (A) to stand without UE support.   Ambulation/Gait Ambulation/Gait Assistance: 5: Supervision Ambulation Distance (Feet): 400 Feet Assistive device: Rolling walker Ambulation/Gait Assistance Details: Slow but steady.   Able to scan environment without LOB.  Cues to stay closer to RW & for tall posture Gait Pattern: Step-through pattern;Decreased stride length Gait velocity: decreased Stairs: No Wheelchair Mobility Wheelchair Mobility: No      PT Goals (current goals can now be found in the care plan section) Acute Rehab PT Goals PT Goal Formulation: With patient Time For Goal Achievement: 05/09/13 Potential to Achieve Goals: Fair  Visit Information  Last PT Received On: 05/01/13 Assistance Needed: +1 History of Present Illness: [name] [name] is a 62 y.o. male presenting with abdominal discomfort and distention, found to have AKI and markedly elevated LFT's . PMH is significant for CHF, COPD, HTN, and tobacco abuse. Pt with heart and liver failure.  Patient s/p card cath 11/7 - severe 3-vessel disease, NSTEMI, LV systolic dysfunction.    Subjective Data      Cognition  Cognition Arousal/Alertness: Awake/alert Behavior During Therapy: WFL for tasks assessed/performed Overall Cognitive Status: Within Functional Limits for tasks assessed    Balance  Static Standing Balance Static Standing - Balance Support: No upper extremity supported Static Standing - Level of Assistance: 5: Stand by assistance Static Standing - Comment/# of Minutes: mild anterior-posterior sway with standing with eyes closed  End of Session PT - End of Session Equipment Utilized During Treatment: Gait belt Activity Tolerance: Patient tolerated treatment well Patient left: in chair;with call bell/phone within reach Nurse Communication: Mobility status   GP  Sena Hitch 05/01/2013, 2:57 PM  Sarajane Marek, [place] [phone] 05/01/2013

## 2013-05-01 NOTE — Progress Notes (Signed)
Patient ID: Kino Dingeldein, male   DOB: May 28, 1951, 62 y.o.   MRN: [id number]    SUBJECTIVE: Feels ok. Denies CP or dyspnea. Weight is now down 29 lbs total.  Cath last week with severe 3V CAD. RHC as below  Ao: 105/59  LV: 110/10/19  RA: 13  RV: 42/5//13  PA: 47/15 (mean 31)  PCWP: 19  Fick Cardiac Output: 10 L/min  Fick Cardiac Index: 4.86 L/min/m2  PVR: 1.2 WU Central Aortic Saturation: 87%  Pulmonary Artery Saturation: 68%  Medications   . aspirin EC  81 mg Oral Daily  . carvedilol  6.25 mg Oral BID WC  . digoxin  0.125 mg Oral Daily  . furosemide  80 mg Oral BID  . heparin subcutaneous  5,000 Units Subcutaneous Q8H  . lisinopril  5 mg Oral Daily  . mupirocin ointment  1 application Nasal BID  . nicotine  7 mg Transdermal Daily  . pantoprazole  40 mg Oral Daily  . polyethylene glycol  17 g Oral Daily  . simvastatin  20 mg Oral q1800  . sodium chloride  3 mL Intravenous Q12H  . tiotropium  18 mcg Inhalation Daily     Filed Vitals:   04/30/13 1300 04/30/13 1721 04/30/13 2003 05/01/13 0439  BP: 113/66 120/75 131/76 102/67  Pulse: 90 99 88 89  Temp: 98.1 F (36.7 C)  98.7 F (37.1 C) 98.8 F (37.1 C)  TempSrc: Oral  Oral Oral  Resp: 20 20 20 20   Height:      Weight:    84.7 kg (186 lb 11.7 oz)  SpO2: 97% 98% 100% 96%    Intake/Output Summary (Last 24 hours) at 05/01/13 0755 Last data filed at 05/01/13 0743  Gross per 24 hour  Intake   1803 ml  Output   4275 ml  Net  -2472 ml    LABS: Basic Metabolic Panel:  Recent Labs  04/29/13 0433  NA 135  K 3.7  CL 97  CO2 29  GLUCOSE 84  BUN 16  CREATININE 1.03  CALCIUM 8.9   Liver Function Tests:  Recent Labs  04/29/13 0433  AST 126*  ALT 344*  ALKPHOS 86  BILITOT 2.9*  PROT 6.0  ALBUMIN 2.7*   No results found for this basename: LIPASE, AMYLASE,  in the last 72 hours CBC: No results found for this basename: WBC, NEUTROABS, HGB, HCT, MCV, PLT,  in the last 72 hours  RADIOLOGY: Ct Abdomen  Pelvis Wo Contrast  04/22/2013   CLINICAL DATA:  Abdominal pain and distention.  EXAM: CT ABDOMEN AND PELVIS WITHOUT CONTRAST  TECHNIQUE: Multidetector CT imaging of the abdomen and pelvis was performed following the standard protocol without intravenous contrast.  COMPARISON:  04/21/2013  FINDINGS: Mild ascites again seen within the abdomen and pelvis, without significant change. The liver, gallbladder, spleen, pancreas, adrenal glands, and kidneys are unremarkable in appearance except for small right renal cyst. Vicarious excretion of contrast noted in the gallbladder as well as mild residual contrast enhancement of both kidneys from the most recent CT. No soft tissue masses or lymphadenopathy identified within the abdomen or pelvis. Residual contrast seen in the urinary bladder.  Diverticulosis is seen mainly involving the sigmoid colon, however there is no evidence of diverticulitis. No focal inflammatory process or abscess identified. No evidence of bowel obstruction or hernia.  IMPRESSION: Mild ascites, without significant change since prior exam.  Diverticulosis. No radiographic evidence of diverticulitis.   Electronically Signed   By: Jenny Reichmann  Kris Hartmann M.D.   On: 04/22/2013 20:39   Dg Chest 2 View  04/22/2013   CLINICAL DATA:  Chest pain and vomiting  EXAM: CHEST  2 VIEW  COMPARISON:  04/21/2013  FINDINGS: The heart is again enlarged in size but stable. The lungs are well aerated bilaterally. Some very mild patchy changes are noted in the right upper lobe which may represent some early infiltrate.  IMPRESSION: Stable cardiomegaly.  New patchy changes in the right upper lobe which may represent an early infiltrate.   Electronically Signed   By: Inez Catalina M.D.   On: 04/22/2013 14:51   US Abdomen Complete  04/22/2013   CLINICAL DATA:  Abdominal pain.  EXAM: ULTRASOUND ABDOMEN COMPLETE  COMPARISON:  None.  FINDINGS: Gallbladder  No gallstones identified. Mild diffuse gallbladder wall thickening seen  measuring 4 mm, however no sonographic Murphy sign was noted.  Common bile duct  Diameter: 3 mm  Liver  No focal lesion identified. Within normal limits in parenchymal echogenicity. Mild perihepatic ascites noted.  IVC  No abnormality visualized.  Pancreas  Visualized portion unremarkable.  Spleen  Size and appearance within normal limits.  Right Kidney  Length: 10.3 cm. Echogenicity within normal limits. No mass or hydronephrosis visualized.  Left Kidney  Length: 10.4 cm. Echogenicity within normal limits. No mass or hydronephrosis visualized.  Abdominal aorta  No aneurysm visualized.  IMPRESSION: No evidence of gallstones or biliary dilatation.  Mild diffuse gallbladder wall thickening, which is a nonspecific finding. Mild ascites also noted.  Unremarkable sonographic appearance of the liver.   Electronically Signed   By: Earle Gell M.D.   On: 04/22/2013 16:46   Dg Chest Port 1 View  04/23/2013   CLINICAL DATA:  62 year old male with reason chest pain and vomiting. Fluid status.  EXAM: PORTABLE CHEST - 1 VIEW  COMPARISON:  04/22/2013 and earlier.  FINDINGS: Portable AP semi upright view at 2056 hrs. Stable cardiomegaly and mediastinal contours. Previously seen subtle patchy right lung opacity does not persist. No pulmonary edema, pleural effusion, pneumothorax, or consolidation. Stable visualized osseous structures.  IMPRESSION: Interval resolved nonspecific patchy pulmonary opacity. Stable cardiomegaly and no acute cardiopulmonary abnormality.   Electronically Signed   By: Lars Pinks M.D.   On: 04/23/2013 21:09    PHYSICAL EXAM General: NAD HEENT: normal except for poor dentition Neck: JVP ~ 10 prominent V wave, no thyromegaly or thyroid nodule.  Lungs: Decreased breath sounds at bases. CV: Nondisplaced PMI.  Heart regular S1/S2, no S3/S4, 2/6 HSM LLSB.  tr edema to knees bilaterally.  No carotid bruit.   Abdomen: Soft, nontender, no hepatosplenomegaly mild distention (improving).  Neurologic: sleepy  after cath but arousable Psych: Normal affect. Extremities: No clubbing or cyanosis.   TELEMETRY: Reviewed telemetry pt in NSR  ASSESSMENT AND PLAN: 62 yo with history of COPD, diastolic CHF, and HTN presented with abdominal distention and elevated LFTs in setting of ADHF  Echo showed EF 20-25% with moderate RV dysfunction.  1. Elevated LFTs: Improving. likely due to congestion from HF and shock liver. May have component of fatty liver. Hepatitis panels negative.  2. Acute on chronic systolic CHF: EF 0000000 on echo with moderate RV dysfunction 3. AKI on probable CKD.  Resolved, creatinine down to 1.  4. 3V CAD  Volume status much improved. Cardiac output better than expected. Evidence of RV dysfunction on RHC. Now on po Lasix, can decrease to 60 mg po bid.  CVTS following for possible CABG this week, timing per  Dr. Roxan Hockey.  Will increase lisinopril to 5 mg daily today.  If K stable in am, will add spironolactone.    Started low dose statin for CAD being mindful of elevated LFTs.   Loralie Champagne 05/01/2013 7:55 AM

## 2013-05-01 NOTE — Progress Notes (Signed)
CARDIAC REHAB PHASE I   PRE:  Rate/Rhythm: 90SR  BP:  Supine:   Sitting: 96/68  Standing:    SaO2: 100%RA  MODE:  Ambulation: 700 ft   POST:  Rate/Rhythm: 98SR  BP:  Supine:   Sitting: 133/75  Standing:    SaO2: 100%RA [id number] Pt walked 700 ft with rolling walker with steady gait. Tolerated well. No DOE noted. Discussed sternal precautions with pt and demonstrated how to get up and down without use of arms. Will continue to re enforce as we see pt. Does not have IS yet. Did not need to stop and rest during walk.   Graylon Good, RN [name]  05/01/2013 8:54 AM

## 2013-05-01 NOTE — ED Provider Notes (Signed)
Medical screening examination/treatment/procedure(s) were performed by non-physician practitioner and as supervising physician I was immediately available for consultation/collaboration.  EKG Interpretation     Ventricular Rate:  80 PR Interval:  174 QRS Duration: 151 QT Interval:  452 QTC Calculation: 521 R Axis:   -57 Text Interpretation:  Sinus rhythm Probable left atrial enlargement Right bundle branch block              Lebron Quam, MD 05/01/13 0725

## 2013-05-01 NOTE — Progress Notes (Signed)
3 Days Post-Op Procedure(s) (LRB): LEFT AND RIGHT HEART CATHETERIZATION WITH CORONARY ANGIOGRAM (N/A) Subjective: Fells well this morning   Objective: Vital signs in last 24 hours: Temp:  [98.1 F (36.7 C)-98.8 F (37.1 C)] 98.1 F (36.7 C) (11/10 1008) Pulse Rate:  [56-99] 56 (11/10 1008) Cardiac Rhythm:  [-] Normal sinus rhythm (11/09 2001) Resp:  [18-20] 18 (11/10 1008) BP: (102-131)/(66-76) 115/68 mmHg (11/10 1008) SpO2:  [96 %-100 %] 98 % (11/10 1008) Weight:  [186 lb 11.7 oz (84.7 kg)] 186 lb 11.7 oz (84.7 kg) (11/10 0439)  Hemodynamic parameters for last 24 hours:    Intake/Output from previous day: 11/09 0701 - 11/10 0700 In: 1563 [P.O.:1560; I.V.:3] Out: 4275 [Urine:4275] Intake/Output this shift: Total I/O In: 483 [P.O.:480; I.V.:3] Out: 300 [Urine:300]  General appearance: alert and no distress Heart: regular rate and rhythm Lungs: diminished breath sounds bibasilar Abdomen: mildly distended, nontender  Lab Results: No results found for this basename: WBC, HGB, HCT, PLT,  in the last 72 hours BMET:  Recent Labs  04/29/13 0433 05/01/13 0900  NA 135 135  K 3.7 3.5  CL 97 98  CO2 29 29  GLUCOSE 84 176*  BUN 16 16  CREATININE 1.03 0.98  CALCIUM 8.9 9.0    PT/INR:  Recent Labs  05/01/13 0356  LABPROT 15.3*  INR 1.24   ABG    Component Value Date/Time   PHART 7.424 04/28/2013 0805   HCO3 31.9* 04/28/2013 0813   TCO2 33 04/28/2013 0813   O2SAT 68.0 04/28/2013 0813   CBG (last 3)  No results found for this basename: GLUCAP,  in the last 72 hours  Assessment/Plan: S/P Procedure(s) (LRB): LEFT AND RIGHT HEART CATHETERIZATION WITH CORONARY ANGIOGRAM (N/A) - He continues to improve with medical management and is ready for CABG from a medical standpoint.  His orthopantogram shows significant issues and we need a dental consult and probably some extractions. We have placed a call to Dr. Enrique Sack  Plan CABG this Thursday or Friday depending on  timing of dental   LOS: 9 days    [name] [name] C 05/01/2013

## 2013-05-01 NOTE — Progress Notes (Signed)
Family Medicine Teaching Service Daily Progress Note Intern Pager: [phone]  Patient name: [name] [name] Medical record number: [id number] Date of birth: 09/06/1950 Age: 62 y.o. Gender: male  Primary Care Provider: Fae Pippin Consultants: Cardiology, Cardiothoracic Surgery, PT, Dental Code Status: Full  Pt Overview and Major Events to Date:  04/23/13: Increased LFT's from previous day 04/26/13: Hepatic u/s shows no hepatic/portal vein clots. 04/28/13: cardiac cath shows triple vessel disease  Assessment and Plan: [name] [name] is a 62 y.o. male presenting with abdominal discomfort and distention, found to have AKI and markedly elevated LFT's . PMH is significant for CHF, COPD, HTN, and tobacco abuse. Currently improving.  # Transaminitis with elevated bilirubin, mild ascites: repeat LFT's trending down with AST/ALT: 126/344 (11/8) and tbili of 2.9. Appears to be more related to RHF. INR trending down and currently 1.24. GI signed off. Appreciated their help. He is improving overall - hold home colchicine, amitiza and allopurinol - follow-up daily INR and hepatic function - PT recommends home health PT - heparin subq for VTE prophylaxis since INR decreasing  # CHF: EF: 20-25% which has worsened from previous 45-50% in 2012. PA pressure: 48mmHg. Weight on admission: 215lbs. Currently diuresing well. 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 - UOP in last 24 hours: 4.3L (~2.32ml/kg/hr).  - Weight today is 186lbs (down 29 lbs from admit) - cardiology recs:  - discontinue metoprolol  - continue carvedilol: Inc to 6.25 BID (11/9)  - continue lisinopril: increased to 5mg   - continue digoxin  - switched to lasix 60mg  PO BID - follow-up cardiology recommendations  - Cardiac Rehab following - CT surgery is following and will determine patient's candidacy for CABG - follow-up CT surgery recommendations; will consult dental today  # SOB: Resolved. Most  likely due to CHF. - antibiotics stopped - continue albuterol, spiriva - continue to monitor on telemetry   # Hypokalemia: Resolved  # Right foot wound: being managed by wound care as an outpatient.  - wound care in hospital  # Right leg swelling and erythema: appears to be resolved  # Diabetes: pt reports he has DM, not on any medications per med list. CBG's in 70's-100s. A1C is 5.7  # Gout: hold colchicine, hold allopurinol  # Tobacco abuse: will provide nicotine patch per pt request   FEN/GI: carb modified diet Prophylaxis: heparin SQ   Disposition: pending recommendations from CT surgery  Subjective: Continues to do well. Ambulating well. No problems or complaints overnight. He continues to feel better overall. Has been increasing his ambulation and is not having shortness of breath. No chest pain or abdominal pain.  Objective: Temp:  [98 F (36.7 C)-98.8 F (37.1 C)] 98.8 F (37.1 C) (11/10 0439) Pulse Rate:  [88-99] 89 (11/10 0439) Resp:  [20] 20 (11/10 0439) BP: (102-131)/(66-79) 102/67 mmHg (11/10 0439) SpO2:  [96 %-100 %] 96 % (11/10 0439) Weight:  [186 lb 11.7 oz (84.7 kg)] 186 lb 11.7 oz (84.7 kg) (11/10 0439)  Physical Exam: General: no acute distress, comfortable in bed.  HEENT: poor dentition Cardiovascular: S1S2, RRR, no murmur; No Hepatojugular reflex, No JVD Respiratory: clear to auscultation normal work of breathing, no crackles appreciated Abdomen: soft, non tender, distended consistent prior exams, +BS Extremities: Non-erythematous bilaterally, legs equal in warmth, no tenderness, 1+ edema bilaterally Neuro: Alert and oriented x3  Laboratory:  Recent Labs Lab 04/25/13 0512 04/27/13 0700 04/28/13 0525  WBC 11.3* 8.1 8.6  HGB 10.7* 10.8* 10.6*  HCT 33.7*  32.6* 32.8*  PLT 176 159 167    Recent Labs Lab 04/27/13 0600 04/27/13 0700 04/28/13 0525 04/29/13 0433  NA 139  --  139 135  K 3.4*  --  3.6 3.7  CL 102  --  102 97  CO2 29  --  29  29  BUN 28*  --  20 16  CREATININE 1.24  --  1.07 1.03  CALCIUM 8.8  --  8.9 8.9  PROT  --  5.9* 5.9* 6.0  BILITOT  --  3.7* 2.9* 2.9*  ALKPHOS  --  74 77 86  ALT  --  600* 441* 344*  AST  --  343* 199* 126*  GLUCOSE 82  --  76 84    Recent Labs Lab 04/27/13 0600 04/28/13 0525 04/29/13 0433 04/30/13 0411 05/01/13 0356  INR 2.16* 1.68* 1.42 1.27 1.24    Imaging/Diagnostic Tests: DG Orthopantogram IMPRESSION:  Poor dentition with numerous missing teeth.  Lucency along the root of the right upper central incisor and cuspid  (teeth #8 and 6), likely corresponding to reported loosening.   Medications: Scheduled Meds: . aspirin EC  81 mg Oral Daily  . carvedilol  6.25 mg Oral BID WC  . digoxin  0.125 mg Oral Daily  . furosemide  80 mg Oral BID  . heparin subcutaneous  5,000 Units Subcutaneous Q8H  . lisinopril  2.5 mg Oral Daily  . mupirocin ointment  1 application Nasal BID  . nicotine  7 mg Transdermal Daily  . pantoprazole  40 mg Oral Daily  . polyethylene glycol  17 g Oral Daily  . simvastatin  20 mg Oral q1800  . sodium chloride  3 mL Intravenous Q12H  . tiotropium  18 mcg Inhalation Daily   Continuous Infusions:  PRN Meds:.acetaminophen, albuterol, ondansetron (ZOFRAN) IV, ondansetron   Cordelia Poche, MD 05/01/2013, 7:06 AM PGY-1, [place] Intern pager: [phone], text pages welcome

## 2013-05-02 ENCOUNTER — Inpatient Hospital Stay (HOSPITAL_COMMUNITY): Payer: Medicaid Other

## 2013-05-02 ENCOUNTER — Encounter (HOSPITAL_COMMUNITY): Payer: Self-pay | Admitting: Dentistry

## 2013-05-02 DIAGNOSIS — Z0189 Encounter for other specified special examinations: Secondary | ICD-10-CM

## 2013-05-02 DIAGNOSIS — K0530 Chronic periodontitis, unspecified: Secondary | ICD-10-CM

## 2013-05-02 DIAGNOSIS — K045 Chronic apical periodontitis: Secondary | ICD-10-CM

## 2013-05-02 LAB — CBC WITH DIFFERENTIAL/PLATELET
Basophils Relative: 0 % (ref 0–1)
Eosinophils Relative: 4 % (ref 0–5)
HCT: 36.3 % — ABNORMAL LOW (ref 39.0–52.0)
Hemoglobin: 11.9 g/dL — ABNORMAL LOW (ref 13.0–17.0)
Lymphocytes Relative: 28 % (ref 12–46)
MCHC: 32.8 g/dL (ref 30.0–36.0)
Monocytes Relative: 16 % — ABNORMAL HIGH (ref 3–12)
Neutro Abs: 5.3 10*3/uL (ref 1.7–7.7)
RBC: 4.72 MIL/uL (ref 4.22–5.81)
WBC: 10.1 10*3/uL (ref 4.0–10.5)

## 2013-05-02 LAB — COMPREHENSIVE METABOLIC PANEL
ALT: 132 U/L — ABNORMAL HIGH (ref 0–53)
Albumin: 2.7 g/dL — ABNORMAL LOW (ref 3.5–5.2)
BUN: 21 mg/dL (ref 6–23)
CO2: 25 mEq/L (ref 19–32)
Calcium: 9 mg/dL (ref 8.4–10.5)
GFR calc Af Amer: 83 mL/min — ABNORMAL LOW (ref >90)
GFR calc non Af Amer: 71 mL/min — ABNORMAL LOW (ref >90)
Glucose, Bld: 89 mg/dL (ref 70–99)
Potassium: 4.1 mEq/L (ref 3.5–5.1)
Sodium: 136 mEq/L (ref 135–145)
Total Protein: 7 g/dL (ref 6.0–8.3)

## 2013-05-02 LAB — PROTIME-INR: Prothrombin Time: 12.9 seconds (ref 11.6–15.2)

## 2013-05-02 MED ORDER — ALBUTEROL SULFATE (5 MG/ML) 0.5% IN NEBU
2.5000 mg | INHALATION_SOLUTION | Freq: Once | RESPIRATORY_TRACT | Status: AC
  Administered 2013-05-02: 09:00:00 2.5 mg via RESPIRATORY_TRACT

## 2013-05-02 MED ORDER — ALBUTEROL SULFATE (5 MG/ML) 0.5% IN NEBU
INHALATION_SOLUTION | RESPIRATORY_TRACT | Status: AC
  Filled 2013-05-02: qty 0.5

## 2013-05-02 MED ORDER — CLINDAMYCIN PHOSPHATE 600 MG/50ML IV SOLN
600.0000 mg | Freq: Once | INTRAVENOUS | Status: AC
  Administered 2013-05-03: 600 mg via INTRAVENOUS
  Filled 2013-05-02 (×2): qty 50

## 2013-05-02 MED ORDER — SPIRONOLACTONE 12.5 MG HALF TABLET
12.5000 mg | ORAL_TABLET | Freq: Every day | ORAL | Status: DC
  Administered 2013-05-02 – 2013-05-04 (×2): 12.5 mg via ORAL
  Filled 2013-05-02 (×4): qty 1

## 2013-05-02 NOTE — Progress Notes (Signed)
Patient is being transported to Vascular Lab, patient is stable. Ruben Im RN

## 2013-05-02 NOTE — Progress Notes (Signed)
No complaints, stable day  BP 99/59  Pulse 88  Temp(Src) 98.7 F (37.1 C) (Oral)  Resp 18  Ht 5' 7.5" (1.715 m)  Wt 189 lb 3.2 oz (85.821 kg)  BMI 29.18 kg/m2  SpO2 99%  BMET    Component Value Date/Time   NA 136 05/02/2013 0545   K 4.1 05/02/2013 0545   CL 101 05/02/2013 0545   CO2 25 05/02/2013 0545   GLUCOSE 89 05/02/2013 0545   BUN 21 05/02/2013 0545   CREATININE 1.09 05/02/2013 0545   CALCIUM 9.0 05/02/2013 0545   GFRNONAA 71* 05/02/2013 0545   GFRAA 83* 05/02/2013 0545   For dental extractions tomorrow  Then for CABG on Friday

## 2013-05-02 NOTE — Progress Notes (Signed)
Patient ID: [name] [name], male   DOB: 16-Jul-1950, 62 y.o.   MRN: [id number]    SUBJECTIVE: Feels ok. Denies CP or dyspnea. Awaiting dentist to evaluate.   Cath last week with severe 3V CAD. RHC as below  Ao: 105/59  LV: 110/10/19  RA: 13  RV: 42/5//13  PA: 47/15 (mean 31)  PCWP: 19  Fick Cardiac Output: 10 L/min  Fick Cardiac Index: 4.86 L/min/m2  PVR: 1.2 WU Central Aortic Saturation: 87%  Pulmonary Artery Saturation: 68%  Medications   . aspirin EC  81 mg Oral Daily  . carvedilol  6.25 mg Oral BID WC  . digoxin  0.125 mg Oral Daily  . furosemide  60 mg Oral BID  . heparin subcutaneous  5,000 Units Subcutaneous Q8H  . lisinopril  5 mg Oral Daily  . nicotine  7 mg Transdermal Daily  . pantoprazole  40 mg Oral Daily  . polyethylene glycol  17 g Oral Daily  . simvastatin  20 mg Oral q1800  . sodium chloride  3 mL Intravenous Q12H  . spironolactone  12.5 mg Oral Daily  . tiotropium  18 mcg Inhalation Daily     Filed Vitals:   05/01/13 1008 05/01/13 1410 05/01/13 2101 05/02/13 0555  BP: 115/68 110/65 108/67 118/74  Pulse: 56 84 85 92  Temp: 98.1 F (36.7 C) 98.1 F (36.7 C) 98.8 F (37.1 C) 98.7 F (37.1 C)  TempSrc: Oral Oral Oral Oral  Resp: 18 18 22 18   Height:      Weight:    85.821 kg (189 lb 3.2 oz)  SpO2: 98% 98% 99% 98%    Intake/Output Summary (Last 24 hours) at 05/02/13 0736 Last data filed at 05/02/13 0557  Gross per 24 hour  Intake   1323 ml  Output   1250 ml  Net     73 ml    LABS: Basic Metabolic Panel:  Recent Labs  05/01/13 0900 05/02/13 0545  NA 135 136  K 3.5 4.1  CL 98 101  CO2 29 25  GLUCOSE 176* 89  BUN 16 21  CREATININE 0.98 1.09  CALCIUM 9.0 9.0   Liver Function Tests:  Recent Labs  05/01/13 0900 05/02/13 0545  AST 51* 40*  ALT 190* 132*  ALKPHOS 83 81  BILITOT 2.2* 1.7*  PROT 7.6 7.0  ALBUMIN 3.0* 2.7*   No results found for this basename: LIPASE, AMYLASE,  in the last 72 hours CBC:  Recent Labs   05/01/13 0356  WBC 12.7*  HGB 12.2*  HCT 37.7*  MCV 76.5*  PLT 188    RADIOLOGY: Ct Abdomen Pelvis Wo Contrast  04/22/2013   CLINICAL DATA:  Abdominal pain and distention.  EXAM: CT ABDOMEN AND PELVIS WITHOUT CONTRAST  TECHNIQUE: Multidetector CT imaging of the abdomen and pelvis was performed following the standard protocol without intravenous contrast.  COMPARISON:  04/21/2013  FINDINGS: Mild ascites again seen within the abdomen and pelvis, without significant change. The liver, gallbladder, spleen, pancreas, adrenal glands, and kidneys are unremarkable in appearance except for small right renal cyst. Vicarious excretion of contrast noted in the gallbladder as well as mild residual contrast enhancement of both kidneys from the most recent CT. No soft tissue masses or lymphadenopathy identified within the abdomen or pelvis. Residual contrast seen in the urinary bladder.  Diverticulosis is seen mainly involving the sigmoid colon, however there is no evidence of diverticulitis. No focal inflammatory process or abscess identified. No evidence of bowel obstruction or hernia.  IMPRESSION: Mild ascites, without significant change since prior exam.  Diverticulosis. No radiographic evidence of diverticulitis.   Electronically Signed   By: Earle Gell M.D.   On: 04/22/2013 20:39   Dg Chest 2 View  04/22/2013   CLINICAL DATA:  Chest pain and vomiting  EXAM: CHEST  2 VIEW  COMPARISON:  04/21/2013  FINDINGS: The heart is again enlarged in size but stable. The lungs are well aerated bilaterally. Some very mild patchy changes are noted in the right upper lobe which may represent some early infiltrate.  IMPRESSION: Stable cardiomegaly.  New patchy changes in the right upper lobe which may represent an early infiltrate.   Electronically Signed   By: Inez Catalina M.D.   On: 04/22/2013 14:51   US Abdomen Complete  04/22/2013   CLINICAL DATA:  Abdominal pain.  EXAM: ULTRASOUND ABDOMEN COMPLETE  COMPARISON:  None.   FINDINGS: Gallbladder  No gallstones identified. Mild diffuse gallbladder wall thickening seen measuring 4 mm, however no sonographic Murphy sign was noted.  Common bile duct  Diameter: 3 mm  Liver  No focal lesion identified. Within normal limits in parenchymal echogenicity. Mild perihepatic ascites noted.  IVC  No abnormality visualized.  Pancreas  Visualized portion unremarkable.  Spleen  Size and appearance within normal limits.  Right Kidney  Length: 10.3 cm. Echogenicity within normal limits. No mass or hydronephrosis visualized.  Left Kidney  Length: 10.4 cm. Echogenicity within normal limits. No mass or hydronephrosis visualized.  Abdominal aorta  No aneurysm visualized.  IMPRESSION: No evidence of gallstones or biliary dilatation.  Mild diffuse gallbladder wall thickening, which is a nonspecific finding. Mild ascites also noted.  Unremarkable sonographic appearance of the liver.   Electronically Signed   By: Earle Gell M.D.   On: 04/22/2013 16:46   Dg Chest Port 1 View  04/23/2013   CLINICAL DATA:  62 year old male with reason chest pain and vomiting. Fluid status.  EXAM: PORTABLE CHEST - 1 VIEW  COMPARISON:  04/22/2013 and earlier.  FINDINGS: Portable AP semi upright view at 2056 hrs. Stable cardiomegaly and mediastinal contours. Previously seen subtle patchy right lung opacity does not persist. No pulmonary edema, pleural effusion, pneumothorax, or consolidation. Stable visualized osseous structures.  IMPRESSION: Interval resolved nonspecific patchy pulmonary opacity. Stable cardiomegaly and no acute cardiopulmonary abnormality.   Electronically Signed   By: Lars Pinks M.D.   On: 04/23/2013 21:09    PHYSICAL EXAM General: NAD HEENT: normal except for poor dentition Neck: JVP 8 cm, no thyromegaly or thyroid nodule.  Lungs: Decreased breath sounds at bases. CV: Nondisplaced PMI.  Heart regular S1/S2, no S3/S4, 2/6 HSM LLSB.  No edema.  No carotid bruit.   Abdomen: Soft, nontender, no  hepatosplenomegaly mild distention (improving).  Neurologic: A&O x 3 Psych: Normal affect. Extremities: No clubbing or cyanosis.   TELEMETRY: Reviewed telemetry pt in NSR  ASSESSMENT AND PLAN: 62 yo with history of COPD, diastolic CHF, and HTN presented with abdominal distention and elevated LFTs in setting of ADHF  Echo showed EF 20-25% with moderate RV dysfunction.  1. Elevated LFTs: Improving steadily. Likely due to congestion from HF and shock liver. May have component of fatty liver. Hepatitis panels negative.  2. Acute on chronic systolic CHF: EF 0000000 on echo with moderate RV dysfunction 3. AKI on probable CKD.  Resolved, creatinine down to 1.  4. 3V CAD  Volume status much improved. Cardiac output better than expected. Evidence of RV dysfunction on RHC. Now on Lasix  60 mg po bid.  Continue lisinopril and Coreg, add spironolactone 12.5 mg daily today and follow K.  Plan for CABG Thursday or Friday, needs dental evaluation today.     Started low dose statin for CAD being mindful of elevated LFTs.   Loralie Champagne 05/02/2013 7:36 AM

## 2013-05-02 NOTE — Progress Notes (Signed)
CARDIAC REHAB PHASE I   PRE:  Rate/Rhythm: 92 SR  BP:  Supine: 117/76  Sitting:   Standing:    SaO2: 100 RA  MODE:  Ambulation: 760 ft   POST:  Rate/Rhythm: 98  BP:  Supine:   Sitting: 130/71  Standing:    SaO2: 100 RA 1417-1455  Assisted X 1 and used walker to ambulate. Pt c/o of stiffness. He was able to walk 760 feet without c/o of cp or SOB. VS stable Pt to recliner after walk with call light in reach.  Rodney Langton RN 05/02/2013 2:52 PM

## 2013-05-02 NOTE — Progress Notes (Signed)
Attending Addendum  I examined the patient and discussed the assessment and plan with Dr. Lonny Prude. I have reviewed the note and agree.    [name] [name], [place]

## 2013-05-02 NOTE — Consult Note (Signed)
DENTAL CONSULTATION  Date of Consultation:  05/02/2013 Patient Name:   [name] [name] Date of Birth:   1951/01/13 Medical Record Number: [id number]  VITALS: BP 118/74  Pulse 92  Temp(Src) 98.7 F (37.1 C) (Oral)  Resp 18  Ht 5' 7.5" (1.715 m)  Wt 189 lb 3.2 oz (85.821 kg)  BMI 29.18 kg/m2  SpO2 98%   CHIEF COMPLAINT: Patient referred for evaluation of poor dentition prior to anticipated heart surgery.  HPI: Patient recently diagnosed with coronary artery disease with anticipated coronary artery bypass graft procedure with Dr. Roxan Hockey.  Patient is noted to have poor dentition with risk for infection. A dental consultation was then requested prior to anticipated heart surgery to rule out dental infection that may affect the patient's systemic health and anticipated coronary artery bypass graft procedure.  The patient currently denies acute toothache, swellings, or abscesses. Patient was last seen by his dentist " along time ago". The patient has no primary dentist. The patient has no partial dentures. The patient is interested in having all remaining teeth extracted at this time.   PROBLEM LIST: Patient Active Problem List   Diagnosis Date Noted  . Coronary atherosclerosis of native coronary artery 04/28/2013  . Acute systolic heart failure [id number]  . Pneumonia 05/18/2011  . Gout 05/17/2011  . Respiratory distress, acute 05/17/2011  . Hypertension 05/17/2011  . COPD (chronic obstructive pulmonary disease) 05/17/2011  . CHF, acute 05/17/2011    PMH: Past Medical History  Diagnosis Date  . COPD (chronic obstructive pulmonary disease)   . Gout   . Emphysema   . Hypertension   . Hyperlipidemia   . Tobacco abuse     30+ pack-year history  . Diabetes mellitus without complication   . CHF (congestive heart failure)     PSH: Past Surgical History  Procedure Laterality Date  . Appendectomy  as a child    ALLERGIES: Allergies  Allergen Reactions  . Penicillins  Other (See Comments)    Swelling , long time ago.     MEDICATIONS: Current Facility-Administered Medications  Medication Dose Route Frequency Provider Last Rate Last Dose  . acetaminophen (TYLENOL) tablet 650 mg  650 mg Oral Q4H PRN Burnell Blanks, MD   650 mg at 05/02/13 0729  . albuterol (PROVENTIL HFA;VENTOLIN HFA) 108 (90 BASE) MCG/ACT inhaler 2 puff  2 puff Inhalation Q4H PRN Leeanne Rio, MD      . aspirin EC tablet 81 mg  81 mg Oral Daily Leeanne Rio, MD   81 mg at 05/01/13 1010  . carvedilol (COREG) tablet 6.25 mg  6.25 mg Oral BID WC Phill Myron, MD   6.25 mg at 05/02/13 [id number]  . digoxin (LANOXIN) tablet 0.125 mg  0.125 mg Oral Daily Larey Dresser, MD   0.125 mg at 05/01/13 1010  . furosemide (LASIX) tablet 60 mg  60 mg Oral BID Larey Dresser, MD   60 mg at 05/02/13 0729  . heparin injection 5,000 Units  5,000 Units Subcutaneous Q8H Cordelia Poche, MD   5,000 Units at 05/02/13 [phone]  . lisinopril (PRINIVIL,ZESTRIL) tablet 5 mg  5 mg Oral Daily Larey Dresser, MD   5 mg at 05/01/13 1010  . nicotine (NICODERM CQ - dosed in mg/24 hr) patch 7 mg  7 mg Transdermal Daily Dickie La, MD   7 mg at 05/01/13 1010  . ondansetron (ZOFRAN) tablet 4 mg  4 mg Oral Q6H PRN Leeanne Rio, MD  Or  . ondansetron (ZOFRAN) injection 4 mg  4 mg Intravenous Q6H PRN Leeanne Rio, MD      . pantoprazole (PROTONIX) EC tablet 40 mg  40 mg Oral Daily Leeanne Rio, MD   40 mg at 05/01/13 1010  . polyethylene glycol (MIRALAX / GLYCOLAX) packet 17 g  17 g Oral Daily Vena Rua, PA-C   17 g at 05/01/13 1014  . simvastatin (ZOCOR) tablet 20 mg  20 mg Oral q1800 Jolaine Artist, MD   20 mg at 05/01/13 1817  . sodium chloride 0.9 % injection 3 mL  3 mL Intravenous Q12H Leeanne Rio, MD   3 mL at 05/01/13 2157  . spironolactone (ALDACTONE) tablet 12.5 mg  12.5 mg Oral Daily Larey Dresser, MD      . tiotropium [facility]) inhalation capsule 18 mcg  18 mcg  Inhalation Daily Leeanne Rio, MD   18 mcg at 04/30/13 [id number]    LABS: Lab Results  Component Value Date   WBC 12.7* 05/01/2013   HGB 12.2* 05/01/2013   HCT 37.7* 05/01/2013   MCV 76.5* 05/01/2013   PLT 188 05/01/2013      Component Value Date/Time   NA 136 05/02/2013 0545   K 4.1 05/02/2013 0545   CL 101 05/02/2013 0545   CO2 25 05/02/2013 0545   GLUCOSE 89 05/02/2013 0545   BUN 21 05/02/2013 0545   CREATININE 1.09 05/02/2013 0545   CALCIUM 9.0 05/02/2013 0545   GFRNONAA 71* 05/02/2013 0545   GFRAA 83* 05/02/2013 0545   Lab Results  Component Value Date   INR 0.99 05/02/2013   INR 1.24 05/01/2013   INR 1.27 04/30/2013   No results found for this basename: PTT    SOCIAL HISTORY: History   Social History  . Marital Status: Significant Other    Spouse Name: N/A    Number of Children: N/A  . Years of Education: N/A   Occupational History  . Retired    Social History Main Topics  . Smoking status: Former Smoker -- 1.00 packs/day for 30 years    Quit date: 05/11/2011  . Smokeless tobacco: Not on file  . Alcohol Use: No  . Drug Use: No  . Sexual Activity: Not on file   Other Topics Concern  . Not on file   Social History Narrative  . No narrative on file    FAMILY HISTORY: Family History  Problem Relation Age of Onset  . Diabetes Mother   . Hypertension    . Heart attack      Brothers     REVIEW OF SYSTEMS: Reviewed from chart for this admission.  DENTAL HISTORY: CHIEF COMPLAINT: Patient referred for evaluation of poor dentition prior to anticipated heart surgery.  HPI: Patient recently diagnosed with coronary artery disease with anticipated coronary artery bypass graft procedure with Dr. Roxan Hockey.  Patient is noted to have poor dentition with risk for infection. A dental consultation was then requested prior to anticipated heart surgery to rule out dental infection that may affect the patient's systemic health and anticipated coronary  artery bypass graft procedure.  The patient currently denies acute toothache, swellings, or abscesses. Patient was last seen by his dentist " along time ago". The patient has no primary dentist. The patient has no partial dentures. The patient is interested in having all remaining teeth extracted at this time.  DENTAL EXAMINATION:  GENERAL: The patient is a well-developed, well-nourished male in no acute distress. HEAD  AND NECK: There is no obvious palpable submandibular lymphadenopathy. The patient denies acute TMJ symptoms. INTRAORAL EXAM: The patient has normal saliva. There is no evidence of intraoral abscess formation at this time. There is atrophy of edentulous alveolar ridges. DENTITION: Patient is missing multiple teeth numbers 1, 2, 7, 10, 11, 12, 13, 14, 15, 16, 17, 18, 19, 20, 21, 22, 23, 24, 25, 26, 28, 29, 30, 31, and 32. PERIODONTAL: The patient has chronic, advanced periodontal disease with plaque and calculus accumulations, generalized gingival recession and generalized tooth mobility. There is moderate to severe bone loss noted. DENTAL CARIES/SUBOPTIMAL RESTORATIONS: The patient has multiple dental caries affecting the remaining dentition. ENDODONTIC: The patient currently denies acute toothache symptoms. Patient has multiple areas of periapical pathology and radiolucency. CROWN AND BRIDGE: There are no crowns or bridges. PROSTHODONTIC: Patient denies having partial dentures. OCCLUSION: The patient has a poor occlusal scheme secondary to multiple missing teeth, supra-eruption and drifting of the unopposed teeth into the edentulous areas, and lack of replacement of missing teeth with dental prostheses.  RADIOGRAPHIC INTERPRETATION: An orthopantogram was taken on 04/29/2013. There are multiple missing teeth. There is moderate to severe bone loss. There is supra-eruption and drifting of the unopposed teeth into the edentulous areas. There dental caries noted. There multiple areas of  periapical pathology and radiolucency. There is radiographic calculus noted. A poor occlusal scheme is noted. There is atrophy of the edentulous alveolar ridges.   ASSESSMENTS: 1. Chronic apical periodontitis 2. Chronic periodontitis with bone loss 3. Gingival recession 4. Plaque and calculus accumulations 5. Tooth mobility 6. Dental caries 7. Multiple missing teeth 8. Supra-eruption and drifting of the unopposed teeth into the edentulous areas 9. Poor occlusal scheme and malocclusion 10. No history of partial dentures 11. History of oral neglect 12. Significant cardiovascular and respiratory compromise with potential risk for complications up to and including death.  PLAN/RECOMMENDATIONS: 1. I discussed the risks, benefits, and complications of various treatment options with the patient in relationship to his medical and dental conditions and anticipated coronary artery bypass graft procedure. We discussed various treatment options to include no treatment, multiple extractions with alveoloplasty, pre-prosthetic surgery as indicated, periodontal therapy, dental restorations, root canal therapy, crown and bridge therapy, implant therapy, and replacement of missing teeth as indicated. The patient currently wishes to proceed with extraction of remaining teeth with alveoloplasty and pre-prosthetic surgery as indicated in the operating room with general anesthesia.  This is been scheduled tentatively for Wednesday, 05/03/2013 at 8:30 AM pending discussion with the cardiology team and TCTS surgeon, Dr. Roxan Hockey. We will need to have heparin therapy discontinued prior to invasive dental procedures.  After dental procedures, Dr. Roxan Hockey may proceed with the coronary artery bypass graft procedure is indicated per his discretion. Patient will followup with a primary dentist of his choice for fabrication of upper and lower complete dentures after adequate healing and once medically stable from the  anticipated coronary artery bypass graft procedure.  2. Discussion of findings with medical team and coordination of future medical and dental care as needed.   Lenn Cal, DDS

## 2013-05-02 NOTE — Progress Notes (Signed)
Family Medicine Teaching Service Daily Progress Note Intern Pager: [phone]  Patient name: [name] [name] Medical record number: [id number] Date of birth: August 24, 1950 Age: 62 y.o. Gender: male  Primary Care Provider: Fae Pippin Consultants: Cardiology, Cardiothoracic Surgery, PT, Dental Code Status: Full  Pt Overview and Major Events to Date:  04/23/13: Increased LFT's from previous day 04/26/13: Hepatic u/s shows no hepatic/portal vein clots. 04/28/13: cardiac cath shows triple vessel disease  Assessment and Plan: [name] [name] is a 62 y.o. male presenting with abdominal discomfort and distention, found to have AKI and markedly elevated LFT's . PMH is significant for CHF, COPD, HTN, and tobacco abuse. Currently improving.  # Transaminitis with elevated bilirubin, mild ascites: repeat LFT's trending down with AST/ALT: 126/344 (11/8) and tbili of 1.7. Appears to be more related to RHF. INR trending down and currently 0.99. He is improving overall - hold home colchicine, amitiza and allopurinol - follow-up daily INR and hepatic function - PT recommends home health PT (ordered) - heparin subq for VTE prophylaxis since INR decreasing  #CAD: 3 vessel disease found on cath with severe occlusion of RCA. CABG this Thursday (11/13) or Friday (11/14) - dental will extract teeth on 11/12, heparin to be held before procedure - CT surgery is following and will determine patient's candidacy for CABG - follow-up CT surgery recommendations; will consult dental today  # CHF: EF: 20-25% which has worsened from previous 45-50% in 2012. PA pressure: 33mmHg. Weight on admission: 215lbs. Currently diuresing well. 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 - UOP in last 24 hours: 1.25L (~0.89ml/kg/hr).  - Weight today is 189lbs (down 26 lbs from admit) - cardiology recs:  - discontinue metoprolol  - continue carvedilol: continue 6.25 BID (11/9)  - continue lisinopril:  increased to 5mg   - continue digoxin  - continue lasix 60mg  PO BID  - spironolactone started today - follow-up cardiology recommendations  - Cardiac Rehab following  # Hx of SOB: Resolved. Most likely due to CHF. - continue albuterol, spiriva - continue to monitor on telemetry   # Right foot wound: being managed by wound care as an outpatient.  - wound care in hospital  # Right leg swelling and erythema: appears to be resolved  # Diabetes: pt reports he has DM, not on any medications per med list. CBG's in 70's-100s. A1C is 5.7  # Gout: hold colchicine, hold allopurinol  # Tobacco abuse: will provide nicotine patch per pt request   FEN/GI: carb modified diet Prophylaxis: hold heparin for dental procedure in the evening into tomorrow morning. SCDs in the meantime.  Disposition: Dental extraction tomorrow followed by CABG on Thursday or Friday  Subjective: Continues to do well. Ambulating well. No problems or complaints overnight. He continues to feel better overall. Has been increasing his ambulation and is not having shortness of breath. No chest pain or abdominal pain. He saw dentistry today who discussed tooth extraction for Wednesday.  Objective: Temp:  [98.1 F (36.7 C)-98.8 F (37.1 C)] 98.7 F (37.1 C) (11/11 0555) Pulse Rate:  [56-92] 92 (11/11 0555) Resp:  [18-22] 18 (11/11 0555) BP: (108-118)/(65-74) 118/74 mmHg (11/11 0555) SpO2:  [98 %-99 %] 98 % (11/11 0555) Weight:  [189 lb 3.2 oz (85.821 kg)] 189 lb 3.2 oz (85.821 kg) (11/11 0555)  Physical Exam: General: no acute distress, comfortable in bed.  HEENT: poor dentition Cardiovascular: S1S2, RRR, no murmur; No Hepatojugular reflex, No JVD Respiratory: clear to auscultation normal work of breathing, no  crackles appreciated Abdomen: soft, non tender, distended consistent prior exams, +BS Extremities: Non-erythematous bilaterally, legs equal in warmth, no tenderness, 1+ edema bilaterally Neuro: Alert and oriented  x3  Laboratory:  Recent Labs Lab 04/27/13 0700 04/28/13 0525 05/01/13 0356  WBC 8.1 8.6 12.7*  HGB 10.8* 10.6* 12.2*  HCT 32.6* 32.8* 37.7*  PLT 159 167 188    Recent Labs Lab 04/29/13 0433 05/01/13 0900 05/02/13 0545  NA 135 135 136  K 3.7 3.5 4.1  CL 97 98 101  CO2 29 29 25   BUN 16 16 21   CREATININE 1.03 0.98 1.09  CALCIUM 8.9 9.0 9.0  PROT 6.0 7.6 7.0  BILITOT 2.9* 2.2* 1.7*  ALKPHOS 86 83 81  ALT 344* 190* 132*  AST 126* 51* 40*  GLUCOSE 84 176* 89    Recent Labs Lab 04/28/13 0525 04/29/13 0433 04/30/13 0411 05/01/13 0356 05/02/13 0545  INR 1.68* 1.42 1.27 1.24 0.99    Imaging/Diagnostic Tests:  No recent imaging   Medications: Scheduled Meds: . aspirin EC  81 mg Oral Daily  . carvedilol  6.25 mg Oral BID WC  . digoxin  0.125 mg Oral Daily  . furosemide  60 mg Oral BID  . heparin subcutaneous  5,000 Units Subcutaneous Q8H  . lisinopril  5 mg Oral Daily  . nicotine  7 mg Transdermal Daily  . pantoprazole  40 mg Oral Daily  . polyethylene glycol  17 g Oral Daily  . simvastatin  20 mg Oral q1800  . sodium chloride  3 mL Intravenous Q12H  . tiotropium  18 mcg Inhalation Daily   Continuous Infusions:  PRN Meds:.acetaminophen, albuterol, ondansetron (ZOFRAN) IV, ondansetron   Cordelia Poche, MD 05/02/2013, 7:30 AM PGY-1, [place] Intern pager: [phone], text pages welcome

## 2013-05-03 ENCOUNTER — Encounter (HOSPITAL_COMMUNITY): Payer: Self-pay | Admitting: Certified Registered"

## 2013-05-03 ENCOUNTER — Encounter (HOSPITAL_COMMUNITY): Payer: Medicaid Other | Admitting: Anesthesiology

## 2013-05-03 ENCOUNTER — Encounter (HOSPITAL_COMMUNITY)
Admission: EM | Disposition: A | Discharge status: Home / Self Care | Payer: Self-pay | Source: Home / Self Care | Attending: Family Medicine

## 2013-05-03 ENCOUNTER — Inpatient Hospital Stay (HOSPITAL_COMMUNITY): Payer: Medicaid Other | Admitting: Anesthesiology

## 2013-05-03 DIAGNOSIS — K045 Chronic apical periodontitis: Secondary | ICD-10-CM

## 2013-05-03 DIAGNOSIS — K0530 Chronic periodontitis, unspecified: Secondary | ICD-10-CM

## 2013-05-03 DIAGNOSIS — K029 Dental caries, unspecified: Secondary | ICD-10-CM

## 2013-05-03 HISTORY — PX: MULTIPLE EXTRACTIONS WITH ALVEOLOPLASTY: SHX5342

## 2013-05-03 LAB — COMPREHENSIVE METABOLIC PANEL WITH GFR
ALT: 103 U/L — ABNORMAL HIGH (ref 0–53)
AST: 68 U/L — ABNORMAL HIGH (ref 0–37)
Albumin: 2.8 g/dL — ABNORMAL LOW (ref 3.5–5.2)
Alkaline Phosphatase: 71 U/L (ref 39–117)
BUN: 21 mg/dL (ref 6–23)
CO2: 24 meq/L (ref 19–32)
Calcium: 9.1 mg/dL (ref 8.4–10.5)
Chloride: 101 meq/L (ref 96–112)
Creatinine, Ser: 1.03 mg/dL (ref 0.50–1.35)
GFR calc Af Amer: 89 mL/min — ABNORMAL LOW
GFR calc non Af Amer: 76 mL/min — ABNORMAL LOW
Glucose, Bld: 74 mg/dL (ref 70–99)
Potassium: 4.9 meq/L (ref 3.5–5.1)
Sodium: 137 meq/L (ref 135–145)
Total Bilirubin: 1.5 mg/dL — ABNORMAL HIGH (ref 0.3–1.2)
Total Protein: 7.4 g/dL (ref 6.0–8.3)

## 2013-05-03 LAB — PROTIME-INR: INR: 1.01 (ref 0.00–1.49)

## 2013-05-03 LAB — GLUCOSE, CAPILLARY: Glucose-Capillary: 101 mg/dL — ABNORMAL HIGH (ref 70–99)

## 2013-05-03 SURGERY — MULTIPLE EXTRACTION WITH ALVEOLOPLASTY
Anesthesia: General | Wound class: Clean Contaminated

## 2013-05-03 MED ORDER — LIDOCAINE-EPINEPHRINE 2 %-1:100000 IJ SOLN
INTRAMUSCULAR | Status: AC
  Filled 2013-05-03: qty 6.8

## 2013-05-03 MED ORDER — HYDROCODONE-ACETAMINOPHEN 5-325 MG PO TABS
1.0000 | ORAL_TABLET | ORAL | Status: DC | PRN
  Administered 2013-05-03 – 2013-05-04 (×6): 1 via ORAL
  Administered 2013-05-05: 2 via ORAL
  Filled 2013-05-03 (×7): qty 1
  Filled 2013-05-03: qty 2

## 2013-05-03 MED ORDER — OXYCODONE-ACETAMINOPHEN 5-325 MG PO TABS
1.0000 | ORAL_TABLET | ORAL | Status: DC | PRN
  Administered 2013-05-03: 1 via ORAL
  Filled 2013-05-03: qty 1

## 2013-05-03 MED ORDER — FENTANYL CITRATE 0.05 MG/ML IJ SOLN
INTRAMUSCULAR | Status: DC | PRN
  Administered 2013-05-03: 50 ug via INTRAVENOUS

## 2013-05-03 MED ORDER — ENSURE COMPLETE PO LIQD
237.0000 mL | Freq: Two times a day (BID) | ORAL | Status: DC
  Administered 2013-05-03 – 2013-05-04 (×3): 237 mL via ORAL

## 2013-05-03 MED ORDER — DEXMEDETOMIDINE HCL IN NACL 400 MCG/100ML IV SOLN
0.4000 ug/kg/h | INTRAVENOUS | Status: AC
  Filled 2013-05-03: qty 100

## 2013-05-03 MED ORDER — BUPIVACAINE-EPINEPHRINE 0.5% -1:200000 IJ SOLN
INTRAMUSCULAR | Status: DC | PRN
  Administered 2013-05-03: 1.8 mL

## 2013-05-03 MED ORDER — 0.9 % SODIUM CHLORIDE (POUR BTL) OPTIME
TOPICAL | Status: DC | PRN
  Administered 2013-05-03: 1000 mL

## 2013-05-03 MED ORDER — LACTATED RINGERS IV SOLN: INTRAVENOUS | Status: DC

## 2013-05-03 MED ORDER — LIDOCAINE-EPINEPHRINE 2 %-1:100000 IJ SOLN
INTRAMUSCULAR | Status: AC
  Filled 2013-05-03: qty 1.7

## 2013-05-03 MED ORDER — DEXMEDETOMIDINE HCL IN NACL 200 MCG/50ML IV SOLN
INTRAVENOUS | Status: DC | PRN
  Administered 2013-05-03: 0.7 ug/kg/h via INTRAVENOUS

## 2013-05-03 MED ORDER — DEXMEDETOMIDINE HCL 200 MCG/2ML IV SOLN
INTRAVENOUS | Status: DC | PRN
  Administered 2013-05-03: 85 ug via INTRAVENOUS

## 2013-05-03 MED ORDER — MORPHINE SULFATE 2 MG/ML IJ SOLN: 2.0000 mg | INTRAMUSCULAR | Status: DC | PRN

## 2013-05-03 MED ORDER — MIDAZOLAM HCL 2 MG/2ML IJ SOLN
INTRAMUSCULAR | Status: DC | PRN
  Administered 2013-05-03: 2 mg via INTRAVENOUS

## 2013-05-03 MED ORDER — LIDOCAINE-EPINEPHRINE 2 %-1:100000 IJ SOLN
INTRAMUSCULAR | Status: DC | PRN
  Administered 2013-05-03: 1.7 mL

## 2013-05-03 MED ORDER — BUPIVACAINE-EPINEPHRINE PF 0.5-1:200000 % IJ SOLN
INTRAMUSCULAR | Status: AC
  Filled 2013-05-03: qty 10.8

## 2013-05-03 SURGICAL SUPPLY — 40 items
ALCOHOL 70% 16 OZ (MISCELLANEOUS) ×2 IMPLANT
ATTRACTOMAT 16X20 MAGNETIC DRP (DRAPES) ×2 IMPLANT
BLADE SURG 15 STRL LF DISP TIS (BLADE) ×2 IMPLANT
BLADE SURG 15 STRL SS (BLADE) ×2
CLOTH BEACON ORANGE TIMEOUT ST (SAFETY) ×2 IMPLANT
COVER SURGICAL LIGHT HANDLE (MISCELLANEOUS) ×2 IMPLANT
CRADLE DONUT ADULT HEAD (MISCELLANEOUS) ×2 IMPLANT
GAUZE PACKING FOLDED 2  STR (GAUZE/BANDAGES/DRESSINGS) ×1
GAUZE PACKING FOLDED 2 STR (GAUZE/BANDAGES/DRESSINGS) ×1 IMPLANT
GAUZE SPONGE 4X4 16PLY XRAY LF (GAUZE/BANDAGES/DRESSINGS) ×4 IMPLANT
GLOVE BIOGEL PI IND STRL 6 (GLOVE) IMPLANT
GLOVE BIOGEL PI INDICATOR 6 (GLOVE)
GLOVE SURG ORTHO 8.0 STRL STRW (GLOVE) ×2 IMPLANT
GLOVE SURG SS PI 6.0 STRL IVOR (GLOVE) ×2 IMPLANT
GOWN STRL REIN 3XL LVL4 (GOWN DISPOSABLE) ×2 IMPLANT
HEMOSTAT SURGICEL .5X2 ABSORB (HEMOSTASIS) IMPLANT
KIT BASIN OR (CUSTOM PROCEDURE TRAY) ×2 IMPLANT
KIT ROOM TURNOVER OR (KITS) ×2 IMPLANT
MANIFOLD NEPTUNE WASTE (CANNULA) ×2 IMPLANT
NEEDLE BLUNT 16X1.5 OR ONLY (NEEDLE) ×2 IMPLANT
NEEDLE DENTAL 27 LONG (NEEDLE) IMPLANT
NS IRRIG 1000ML POUR BTL (IV SOLUTION) ×2 IMPLANT
PACK EENT II TURBAN DRAPE (CUSTOM PROCEDURE TRAY) ×2 IMPLANT
PAD ARMBOARD 7.5X6 YLW CONV (MISCELLANEOUS) ×4 IMPLANT
SPONGE GAUZE 4X4 12PLY (GAUZE/BANDAGES/DRESSINGS) ×2 IMPLANT
SPONGE SURGIFOAM ABS GEL 100 (HEMOSTASIS) ×2 IMPLANT
SPONGE SURGIFOAM ABS GEL 12-7 (HEMOSTASIS) IMPLANT
SPONGE SURGIFOAM ABS GEL SZ50 (HEMOSTASIS) IMPLANT
SUCTION FRAZIER TIP 10 FR DISP (SUCTIONS) ×2 IMPLANT
SUT CHROMIC 3 0 PS 2 (SUTURE) ×6 IMPLANT
SUT CHROMIC 4 0 P 3 18 (SUTURE) IMPLANT
SUT SILK 2 0 (SUTURE) ×1
SUT SILK 2-0 18XBRD TIE 12 (SUTURE) ×1 IMPLANT
SYR 50ML LL SCALE MARK (SYRINGE) ×2 IMPLANT
SYR 50ML SLIP (SYRINGE) ×2 IMPLANT
TOWEL OR 17X24 6PK STRL BLUE (TOWEL DISPOSABLE) IMPLANT
TOWEL OR 17X26 10 PK STRL BLUE (TOWEL DISPOSABLE) ×2 IMPLANT
TUBE CONNECTING 12X1/4 (SUCTIONS) ×2 IMPLANT
WATER STERILE IRR 1000ML POUR (IV SOLUTION) ×2 IMPLANT
YANKAUER SUCT BULB TIP NO VENT (SUCTIONS) ×2 IMPLANT

## 2013-05-03 NOTE — Progress Notes (Signed)
Family Medicine Teaching Service Daily Progress Note Intern Pager: [phone]  Patient name: [name] [name] Medical record number: [id number] Date of birth: 1951/04/11 Age: 62 y.o. Gender: male  Primary Care Provider: Fae Pippin Consultants: Cardiology, Cardiothoracic Surgery, PT, Dental Code Status: Full  Pt Overview and Major Events to Date:  04/23/13: Increased LFT's from previous day 04/26/13: Hepatic u/s shows no hepatic/portal vein clots. 04/28/13: cardiac cath shows triple vessel disease 05/02/13: spironolactone added to regimen 05/03/13: Dental extraction  Assessment and Plan: [name] [name] is a 62 y.o. male presenting with abdominal discomfort and distention, found to have AKI and markedly elevated LFT's . PMH is significant for CHF, COPD, HTN, and tobacco abuse. Currently improving.   #CAD: 3 vessel disease found on cath with severe occlusion of RCA. CABG this Thursday (11/13) or Friday (11/14) - CT surgery is following and will determine patient's candidacy for CABG - follow-up CT surgery recommendations; will consult dental today  # CHF: EF: 20-25% which has worsened from previous 45-50% in 2012. PA pressure: 75mmHg. Weight on admission: 215lbs. Currently diuresing well. 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 - UOP in last 24 hours: 2.1L (~50ml/kg/hr)  - Weight today is 187lbs (down 28 lbs from admit) - cardiology recs:  - continue carvedilol: continue 6.25 BID (11/9)  - continue lisinopril: increased to 5mg   - continue digoxin  - continue lasix 60mg  PO BID  - continue spironolactone (watch potassium: 4.9) - follow-up cardiology recommendations  - Cardiac Rehab following  # Transaminitis with elevated bilirubin, mild ascites: repeat LFT's trending down with AST/ALT: 68/103 (11/12) and tbili of 1.5. Appears to be more related to RHF. INR stable and currently 1.01. He is improving overall - hold home colchicine, amitiza and  allopurinol - follow-up daily INR and hepatic function - PT recommends home health PT (ordered)  # Hx of SOB: Resolved. Most likely due to CHF. - continue albuterol, spiriva - continue to monitor on telemetry   # Right foot wound: being managed by wound care as an outpatient.  - wound care in hospital  # Right leg swelling and erythema: appears to be resolved  # Diabetes: pt reports he has DM, not on any medications per med list. CBG's in 70's-100s. A1C is 5.7  # Gout: hold colchicine, hold allopurinol  # Tobacco abuse: will provide nicotine patch per pt request   FEN/GI: clear liquid diet Prophylaxis: SCDs  Disposition: CABG on Friday  Subjective: He is s/p dental extraction. He is in mild pain. I explained to him his options for pain medication and told him to call his nurse for pain medication as needed. Otherwise, he continues to do well. No problems or complaints overnight. He continues to feel better overall. No chest pain, shortness of breath or abdominal pain.  Objective: Temp:  [98 F (36.7 C)-98.7 F (37.1 C)] 98 F (36.7 C) (11/12 0606) Pulse Rate:  [74-92] 74 (11/12 0606) Resp:  [18-20] 18 (11/12 0606) BP: (95-115)/(57-71) 115/68 mmHg (11/12 0606) SpO2:  [96 %-99 %] 97 % (11/12 0606) Weight:  [187 lb 1.6 oz (84.868 kg)] 187 lb 1.6 oz (84.868 kg) (11/12 0606)  Physical Exam: General: no acute distress, comfortable in bed.  HEENT: no dentition Cardiovascular: S1S2, RRR, no murmur Respiratory: clear to auscultation normal work of breathing, no crackles appreciated Abdomen: soft, non tender, distended but improving, +BS Extremities: Non-erythematous bilaterally, legs equal in warmth, no tenderness, no edema bilaterally Neuro: Alert and oriented x3  Laboratory:  Recent Labs Lab 04/28/13 0525 05/01/13 0356 05/02/13 1345  WBC 8.6 12.7* 10.1  HGB 10.6* 12.2* 11.9*  HCT 32.8* 37.7* 36.3*  PLT 167 188 172    Recent Labs Lab 05/01/13 0900 05/02/13 0545  05/03/13 0520  NA 135 136 137  K 3.5 4.1 4.9  CL 98 101 101  CO2 29 25 24   BUN 16 21 21   CREATININE 0.98 1.09 1.03  CALCIUM 9.0 9.0 9.1  PROT 7.6 7.0 7.4  BILITOT 2.2* 1.7* 1.5*  ALKPHOS 83 81 71  ALT 190* 132* 103*  AST 51* 40* 68*  GLUCOSE 176* 89 74    Recent Labs Lab 04/28/13 0525 04/29/13 0433 04/30/13 0411 05/01/13 0356 05/02/13 0545  INR 1.68* 1.42 1.27 1.24 0.99    Imaging/Diagnostic Tests:  No recent imaging   Medications: Scheduled Meds: . aspirin EC  81 mg Oral Daily  . carvedilol  6.25 mg Oral BID WC  . clindamycin (CLEOCIN) IV  600 mg Intravenous Once  . digoxin  0.125 mg Oral Daily  . furosemide  60 mg Oral BID  . lisinopril  5 mg Oral Daily  . nicotine  7 mg Transdermal Daily  . pantoprazole  40 mg Oral Daily  . polyethylene glycol  17 g Oral Daily  . simvastatin  20 mg Oral q1800  . sodium chloride  3 mL Intravenous Q12H  . spironolactone  12.5 mg Oral Daily  . tiotropium  18 mcg Inhalation Daily   Continuous Infusions:  PRN Meds:.acetaminophen, albuterol, ondansetron (ZOFRAN) IV, ondansetron   Cordelia Poche, MD 05/03/2013, 6:21 AM PGY-1, [facility] Intern pager: [phone], text pages welcome

## 2013-05-03 NOTE — Progress Notes (Signed)
Attending Addendum  I examined the patient and discussed the assessment and plan with Dr. Lonny Prude. I have reviewed the note and agree.   Patient is back from his dental extraction. Denies pain. Willing to try PO intake. Ordered prn Vicodin to control post op pain. Also ordered soft diet. Patient's sister and wife updated at bedside. The family is awaiting an opportunity to speak with Dr. Roxan Hockey with [facility].     [name] [name], [name] [name]

## 2013-05-03 NOTE — Progress Notes (Signed)
Patient ID: [name] [name], male   DOB: [birth year], 62 y.o.   MRN: [id number]  Patient gone for tooth extraction this morning.  Vitals and labs stable, no changes to medication regimen today.  Follow K closely, it is upper normal after starting spironolactone.   Loralie Champagne 05/03/2013 8:31 AM

## 2013-05-03 NOTE — Transfer of Care (Signed)
Immediate Anesthesia Transfer of Care Note  Patient: [name] [name]  Procedure(s) Performed: Procedure(s): Extraction of tooth #s [id number] with alveoloplast and maxillary left osseous tuberosity reduction (N/A)  Patient Location: PACU  Anesthesia Type:MAC  Level of Consciousness: awake, alert , oriented and patient cooperative  Airway & Oxygen Therapy: Patient Spontanous Breathing and Patient connected to nasal cannula oxygen  Post-op Assessment: Report given to PACU RN, Post -op Vital signs reviewed and stable and Patient moving all extremities  Post vital signs: Reviewed and stable  Complications: No apparent anesthesia complications

## 2013-05-03 NOTE — Op Note (Signed)
Patient:            [name] [name] Date of Birth:  Apr 05, 1951 MRN:                [id number]   DATE OF PROCEDURE:  05/03/2013               OPERATIVE REPORT   PREOPERATIVE DIAGNOSES: 1.   Coronary artery disease 2.   Pre-heart surgery dental protocol 3.   Chronic apical periodontitis 4.   Chronic periodontitis 5.   Dental caries 6.   Multiple mobile teeth  POSTOPERATIVE DIAGNOSES: 1.   Coronary artery disease 2.   Pre-heart surgery dental protocol 3.   Chronic apical periodontitis 4.   Chronic periodontitis 5.   Dental caries 6.   Multiple mobile teeth 7.   Excessive maxillary left tuberosity  OPERATIONS: 1. Multiple extraction of tooth numbers 3, 4, 5, 6, 8, 9, 27 2. 3 Quadrants of alveoloplasty 3. Maxillary left osseus tuberosity reduction   SURGEON: Lenn Cal, DDS  ASSISTANT: Camie Patience, (dental assistant)  ANESTHESIA: monitored anesthesia care per the anesthesia team MEDICATIONS: 1. Clindamycin 600 mg IV prior to invasive dental procedures. 2. Local anesthesia with a total utilization of four carpules each containing 34 mg of lidocaine with 0.017 mg of epinephrine as well as two carpules each containing 9 mg of bupivacaine with 0.009 mg of epinephrine.  SPECIMENS: There are 7 teeth that were discarded.  DRAINS: None  CULTURES: None  COMPLICATIONS: None   ESTIMATED BLOOD LOSS: 50 mLs.  INTRAVENOUS FLUIDS: 400 mLs of Lactated ringers solution.  INDICATIONS: The patient was recently diagnosed with coronary artery disease.  A dental consultation was then requested by Dr. Roxan Hockey.  The patient was examined and treatment planned for extraction of remaining teeth with alveoloplasty and pre-prosthetic surgery as indicated.  This treatment plan was formulated to decrease the risks and complications associated with dental infection from affecting the patient's systemic health and the future coronary artery bypass graft procedure.  OPERATIVE  FINDINGS: Patient was examined operating room number 10.  The teeth were identified for extraction. At this time, an excessive maxillary left tuberosity was noted. The patient was noted be affected by chronic periodontitis, chronic apical periodontitis, multiple mobile teeth, dental caries, and an excessive maxillary left tuberosity.   DESCRIPTION OF PROCEDURE: Patient was brought to the main operating room number 10. Patient was then placed in the supine position on the operating table. Monitored anesthesia care was then induced per the anesthesia team. The patient was then prepped and draped in the usual manner for dental medicine procedure. A timeout was performed. The patient was identified and procedures were verified. The oral cavity was then thoroughly examined with the findings noted above. The patient was then ready for dental medicine procedure as follows:  Local anesthesia was then administered sequentially with a total utilization of 4 carpules each containing 34 mg of lidocaine with 0.017 mg of epinephrine as well as 2 carpules  each containing 9 mg bupivacaine with 0.009 mg of epinephrine.  The Maxillary left and right quadrants first approached. Anesthesia was then delivered utilizing infiltration with lidocaine with epinephrine. A #15 blade incision was then made from the maxillary right tuberosity and extended to the mesial of #11.  A  surgical flap was then carefully reflected. Appropriate amounts of buccal and interseptal bone were then removed utilizing a surgical handpiece and bur and copious amounts of sterile water.  The teeth were then subluxated with a  series of straight elevators. Tooth numbers 2, 3, 4, 5, 6, 8, 9 were then removed with a 150 forceps. A retained root in the area of number 5 was then noted. This was then removed after further alveoloplasty and bone removal. The root was eventually removed with a rongeur without complications. Alveoloplasty was then performed  utilizing a ronguers and bone file. The surgical site was then irrigated with copious amounts of sterile saline. The tissues were approximated and trimmed appropriately. The surgical site was then closed from the maxillary right tuberosity and extended to the mesial of #7 utilizing 3-0 chromic gut suture in a continuous interrupted suture technique x1. The surgical site was then further closed from the distal of #11 and extended to the mesial numbers 7 utilizing 3-0 chromic gut suture in a continuous interrupted suture technique x1.  At this point time the maxillary left tuberosity was approached. A 15 blade incision was made from the maxillary left tuberosity and extended to the mesial of #12. A surgical flap was then carefully reflected to expose the excessive osseus tuberosity. An osseus tuberosity reduction was then achieved utilizing a rongeur and bone file. Excessive tissues were then removed utilizing a series of 15 blade incisions. The tissues were then approximated and further trimmed as needed. The surgical site was then irrigated with copious amounts sterile saline. The surgical site was then closed from the distal of the maxillary tuberosity and extended the mesial #12 utilizing 3-0 chromic gut suture in a continuous interrupted suture technique x1.  At this point time, the mandibular quadrants were approached. The patient was given a right inferior alveolar nerve block and long buccal nerve block utilizing the bupivacaine with epinephrine. Further infiltration was then achieved utilizing the lidocaine with epinephrine. A 15 blade incision was then made from the distal of number 28 and extended the mesial of #26.  A surgical flap was then carefully reflected. Appropriate amounts of buccal and interseptal bone were then removed. Tooth number 27 was then removed with a 151 forceps without complications. Alveoloplasty was then performed utilizing a rongeurs and bone file. The tissues were approximated  and trimmed appropriately. The surgical sites were then irrigated with copious amounts of sterile saline. A piece of Surgifoam was then placed in the extraction socket appropriately. The mandibular right surgical site was then closed from the distal of 28 and extended the mesial of #26 utilizing 3-0 chromic gut suture in a continuous interrupted suture technique x1.   At this point time, the entire mouth was irrigated with copious amounts of sterile saline. The patient was examined for complications, seeing none, the dental medicine procedure was deemed to be complete. The throat pack was removed at this time. A series of 4 x 4 gauze were placed in the mouth to aid hemostasis. The patient was then handed over to the anesthesia team for final disposition. After an appropriate amount of time, the patient was taken to the postanesthsia care unit with stable vital signs and a good condition. All counts were correct for the dental medicine procedure. Heparin SQ may be started tonight at 10 PM. In the meantime, SCDs were ordered. The patient will proceed with coronary artery bypass graft procedure at the discretion of Dr. Roxan Hockey.   Lenn Cal, DDS.

## 2013-05-03 NOTE — Anesthesia Procedure Notes (Signed)
Procedure Name: MAC Date/Time: 05/03/2013 8:38 AM Performed by: Julian Reil Pre-anesthesia Checklist: Patient identified, Emergency Drugs available, Suction available and Patient being monitored Patient Re-evaluated:Patient Re-evaluated prior to inductionOxygen Delivery Method: Nasal cannula Intubation Type: IV induction Placement Confirmation: positive ETCO2 and breath sounds checked- equal and bilateral

## 2013-05-03 NOTE — Progress Notes (Signed)
INITIAL NUTRITION ASSESSMENT  DOCUMENTATION CODES Per approved criteria  -Not Applicable   INTERVENTION: Add Ensure Complete po BID, each supplement provides 350 kcal and 13 grams of protein. RD provided information on "Nutrition Therapy after Oral Surgery." Add MVI daily. RD to continue to follow nutrition care plan.  NUTRITION DIAGNOSIS: Biting/chewing difficulty related to recent teeth exctractions as evidenced by need for slow diet progression.   Goal: Advance diet as tolerated. Intake to meet >90% of estimated nutrition needs.  Monitor:  weight trends, lab trends, I/O's, further diet advancement, PO intake, supplement tolerance  Reason for Assessment: MD Consult  62 y.o. male  Admitting Dx:   ASSESSMENT: PMHx significant for COPD, gout, HTN, emphysema. Admitted with abdominal pain and fullness, n/v x 2 days. Work-up reveals ascites and transaminitis likely 2/2 "shock liver" from poor cardiac output and transient hypotension.  Underwent cardiac cath on 11/7. Pt found to have severe triple vessel CAD with chronic occlusion of RCA. Pt required dental extraction prior to CABG. RD consulted 2/2 multiple dental extractions.  Pt down 28 lb since admission.  Pt was eating well prior to dental extractions. Currently on clear liquids. RD provided handout on "Nutrition Therapy After Oral Surgery." Reviewed with patient and family. Pt is agreeable to Ensure to help with healing.  WOC RN saw pt 11/2 for partial thickness tissue injury to R foot.    Height: Ht Readings from Last 1 Encounters:  04/22/13 5' 7.5" (1.715 m)    Weight: Wt Readings from Last 1 Encounters:  05/03/13 187 lb 1.6 oz (84.868 kg)    Ideal Body Weight: 151 lb  % Ideal Body Weight: 124%  Wt Readings from Last 10 Encounters:  05/03/13 187 lb 1.6 oz (84.868 kg)  05/03/13 187 lb 1.6 oz (84.868 kg)  05/03/13 187 lb 1.6 oz (84.868 kg)  05/03/13 187 lb 1.6 oz (84.868 kg)  05/20/11 229 lb 11.5 oz (104.2  kg)  Admit wt 215 lb  Usual Body Weight: 215 lb  % Usual Body Weight: 87%  BMI:  Body mass index is 28.85 kg/(m^2). Overweight  Estimated Nutritional Needs: Kcal: 1800 - 2000 Protein: 75 - 95 g Fluid: 1.8 - 2 liters  Skin:  Lip incision Diabetic R foot ulcer R arm skin tear  Diet Order: Clear Liquid  EDUCATION NEEDS: -Education needs addressed   Intake/Output Summary (Last 24 hours) at 05/03/13 1324 Last data filed at 05/03/13 0759  Gross per 24 hour  Intake    840 ml  Output   2300 ml  Net  -1460 ml    Last BM: 11/11   Labs:   Recent Labs Lab 05/01/13 0900 05/02/13 0545 05/03/13 0520  NA 135 136 137  K 3.5 4.1 4.9  CL 98 101 101  CO2 29 25 24   BUN 16 21 21   CREATININE 0.98 1.09 1.03  CALCIUM 9.0 9.0 9.1  GLUCOSE 176* 89 74    CBG (last 3)   Recent Labs  05/03/13 1001  GLUCAP 101*   Lab Results  Component Value Date   HGBA1C 5.7* 04/23/2013    Scheduled Meds: . aspirin EC  81 mg Oral Daily  . carvedilol  6.25 mg Oral BID WC  . dexmedetomidine  0.4-1.2 mcg/kg/hr Intravenous To OR  . digoxin  0.125 mg Oral Daily  . furosemide  60 mg Oral BID  . lisinopril  5 mg Oral Daily  . nicotine  7 mg Transdermal Daily  . pantoprazole  40 mg Oral Daily  .  polyethylene glycol  17 g Oral Daily  . simvastatin  20 mg Oral q1800  . sodium chloride  3 mL Intravenous Q12H  . spironolactone  12.5 mg Oral Daily  . tiotropium  18 mcg Inhalation Daily    Continuous Infusions: . lactated ringers      Past Medical History  Diagnosis Date  . COPD (chronic obstructive pulmonary disease)   . Gout   . Emphysema   . Hypertension   . Hyperlipidemia   . Tobacco abuse     30+ pack-year history  . Diabetes mellitus without complication   . CHF (congestive heart failure)     Past Surgical History  Procedure Laterality Date  . Appendectomy  as a child    Inda Coke MS, RD, LDN Pager: [phone] After-hours pager: [phone]

## 2013-05-03 NOTE — Anesthesia Postprocedure Evaluation (Signed)
  Anesthesia Post-op Note  Patient: [name] [name]  Procedure(s) Performed: Procedure(s): Extraction of tooth #s [id number] with alveoloplast and maxillary left osseous tuberosity reduction (N/A)  Patient Location: PACU  Anesthesia Type:MAC  Level of Consciousness: awake, alert  and oriented  Airway and Oxygen Therapy: Patient Spontanous Breathing  Post-op Pain: mild  Post-op Assessment: Post-op Vital signs reviewed, Patient's Cardiovascular Status Stable, Respiratory Function Stable, Patent Airway and Pain level controlled  Post-op Vital Signs: stable  Complications: No apparent anesthesia complications

## 2013-05-03 NOTE — Anesthesia Preprocedure Evaluation (Signed)
Anesthesia Evaluation  Patient identified by MRN, date of birth, ID band Patient awake    Reviewed: Allergy & Precautions, H&P , NPO status , Patient's Chart, lab work & pertinent test results, reviewed documented beta blocker date and time   Airway Mallampati: II TM Distance: >3 FB     Dental  (+) Poor Dentition, Loose and Missing   Pulmonary COPDformer smoker,          Cardiovascular hypertension, Pt. on medications and Pt. on home beta blockers + CAD and +CHF     Neuro/Psych    GI/Hepatic   Endo/Other  diabetes, Type 2  Renal/GU      Musculoskeletal   Abdominal   Peds  Hematology   Anesthesia Other Findings   Reproductive/Obstetrics                           Anesthesia Physical Anesthesia Plan  ASA: IV  Anesthesia Plan: MAC   Post-op Pain Management:    Induction: Intravenous  Airway Management Planned: Nasal Cannula  Additional Equipment:   Intra-op Plan:   Post-operative Plan:   Informed Consent: I have reviewed the patients History and Physical, chart, labs and discussed the procedure including the risks, benefits and alternatives for the proposed anesthesia with the patient or authorized representative who has indicated his/her understanding and acceptance.     Plan Discussed with: Anesthesiologist and Surgeon  Anesthesia Plan Comments:         Anesthesia Quick Evaluation

## 2013-05-03 NOTE — Progress Notes (Signed)
PRE-OPERATIVE NOTE:  05/03/2013 [name] Sellar [id number]  VITALS: BP 115/68  Pulse 74  Temp(Src) 98 F (36.7 C) (Oral)  Resp 18  Ht 5' 7.5" (1.715 m)  Wt 187 lb 1.6 oz (84.868 kg)  BMI 28.85 kg/m2  SpO2 97%  Lab Results  Component Value Date   WBC 10.1 05/02/2013   HGB 11.9* 05/02/2013   HCT 36.3* 05/02/2013   MCV 76.9* 05/02/2013   PLT 172 05/02/2013   BMET    Component Value Date/Time   NA 137 05/03/2013 0520   K PENDING 05/03/2013 0520   CL 101 05/03/2013 0520   CO2 24 05/03/2013 0520   GLUCOSE 74 05/03/2013 0520   BUN 21 05/03/2013 0520   CREATININE 1.03 05/03/2013 0520   CALCIUM 9.1 05/03/2013 0520   GFRNONAA 76* 05/03/2013 0520   GFRAA 89* 05/03/2013 0520    Lab Results  Component Value Date   INR 1.01 05/03/2013   INR 0.99 05/02/2013   INR 1.24 05/01/2013   No results found for this basename: PTT     [name] [name] presents for extraction remaining teeth with alveoloplasty and pre-prosthetic surgery as indicated in the operating room.    SUBJECTIVE: The patient denies any acute medical or dental changes and agrees to proceed with treatment as planned.  EXAM: No sign of acute dental changes.  ASSESSMENT: Patient is affected by coronary artery disease, chronic apical periodontitis, chronic periodontitis, tooth mobility, and dental caries.  PLAN: Patient agrees to proceed with treatment as planned in the operating room as previously discussed and accepts the risks, benefits, complications of the proposed treatment.   Lenn Cal, DDS

## 2013-05-03 NOTE — Progress Notes (Signed)
PT Cancellation Note  Patient Details Name: Franz Aseltine MRN: [id number] DOB: 10-30-1950   Cancelled Treatment:    Reason Eval/Treat Not Completed: Medical issues which prohibited therapy.  Pt gone to OR for tooth extraction.  Will return at later date as able.  Thanks.   INGOLD,Maliq Pilley 05/03/2013, 9:22 AM [name] [phone] (pager)

## 2013-05-04 ENCOUNTER — Inpatient Hospital Stay (HOSPITAL_COMMUNITY): Payer: Medicaid Other

## 2013-05-04 DIAGNOSIS — L97519 Non-pressure chronic ulcer of other part of right foot with unspecified severity: Secondary | ICD-10-CM | POA: Diagnosis present

## 2013-05-04 DIAGNOSIS — K089 Disorder of teeth and supporting structures, unspecified: Secondary | ICD-10-CM

## 2013-05-04 DIAGNOSIS — K000 Anodontia: Secondary | ICD-10-CM

## 2013-05-04 DIAGNOSIS — K08109 Complete loss of teeth, unspecified cause, unspecified class: Secondary | ICD-10-CM

## 2013-05-04 LAB — COMPREHENSIVE METABOLIC PANEL
ALT: 83 U/L — ABNORMAL HIGH (ref 0–53)
BUN: 19 mg/dL (ref 6–23)
CO2: 26 mEq/L (ref 19–32)
Chloride: 99 mEq/L (ref 96–112)
Creatinine, Ser: 0.99 mg/dL (ref 0.50–1.35)
GFR calc non Af Amer: 87 mL/min — ABNORMAL LOW (ref >90)
Sodium: 136 mEq/L (ref 135–145)
Total Bilirubin: 2 mg/dL — ABNORMAL HIGH (ref 0.3–1.2)
Total Protein: 8.3 g/dL (ref 6.0–8.3)

## 2013-05-04 LAB — URINALYSIS, ROUTINE W REFLEX MICROSCOPIC
Bilirubin Urine: NEGATIVE
Glucose, UA: NEGATIVE mg/dL
Ketones, ur: NEGATIVE mg/dL
Leukocytes, UA: NEGATIVE
Nitrite: NEGATIVE
Protein, ur: NEGATIVE mg/dL
Urobilinogen, UA: 4 mg/dL — ABNORMAL HIGH (ref 0.0–1.0)

## 2013-05-04 LAB — CBC
HCT: 37.1 % — ABNORMAL LOW (ref 39.0–52.0)
MCHC: 31.8 g/dL (ref 30.0–36.0)
Platelets: 203 10*3/uL (ref 150–400)
RDW: 21.7 % — ABNORMAL HIGH (ref 11.5–15.5)
WBC: 8.3 10*3/uL (ref 4.0–10.5)

## 2013-05-04 LAB — APTT: aPTT: 33 seconds (ref 24–37)

## 2013-05-04 LAB — SURGICAL PCR SCREEN
MRSA, PCR: NEGATIVE
Staphylococcus aureus: NEGATIVE

## 2013-05-04 LAB — TYPE AND SCREEN

## 2013-05-04 MED ORDER — MAGNESIUM SULFATE 50 % IJ SOLN
40.0000 meq | INTRAMUSCULAR | Status: DC
  Filled 2013-05-04: qty 10

## 2013-05-04 MED ORDER — VANCOMYCIN HCL 10 G IV SOLR
1500.0000 mg | INTRAVENOUS | Status: AC
  Administered 2013-05-05: 1500 mg via INTRAVENOUS
  Filled 2013-05-04: qty 1500

## 2013-05-04 MED ORDER — ASPIRIN EC 81 MG PO TBEC
81.0000 mg | DELAYED_RELEASE_TABLET | Freq: Every day | ORAL | Status: DC
  Filled 2013-05-04: qty 1

## 2013-05-04 MED ORDER — DEXTROSE 5 % IV SOLN
750.0000 mg | INTRAVENOUS | Status: DC
  Filled 2013-05-04: qty 750

## 2013-05-04 MED ORDER — DEXMEDETOMIDINE HCL IN NACL 400 MCG/100ML IV SOLN
0.1000 ug/kg/h | INTRAVENOUS | Status: AC
  Administered 2013-05-05: .3 ug/kg/h via INTRAVENOUS
  Filled 2013-05-04: qty 100

## 2013-05-04 MED ORDER — PLASMA-LYTE 148 IV SOLN
INTRAVENOUS | Status: AC
  Administered 2013-05-05: 09:00:00
  Filled 2013-05-04: qty 2.5

## 2013-05-04 MED ORDER — DEXTROSE 5 % IV SOLN
30.0000 ug/min | INTRAVENOUS | Status: AC
  Administered 2013-05-05: 5 ug/min via INTRAVENOUS
  Filled 2013-05-04: qty 2

## 2013-05-04 MED ORDER — SODIUM CHLORIDE 0.9 % IV SOLN
INTRAVENOUS | Status: AC
  Administered 2013-05-05: 70 mL/h via INTRAVENOUS
  Filled 2013-05-04: qty 40

## 2013-05-04 MED ORDER — DOPAMINE-DEXTROSE 3.2-5 MG/ML-% IV SOLN
2.0000 ug/kg/min | INTRAVENOUS | Status: DC
  Filled 2013-05-04: qty 250

## 2013-05-04 MED ORDER — POTASSIUM CHLORIDE 2 MEQ/ML IV SOLN
80.0000 meq | INTRAVENOUS | Status: DC
  Filled 2013-05-04: qty 40

## 2013-05-04 MED ORDER — NITROGLYCERIN IN D5W 200-5 MCG/ML-% IV SOLN
2.0000 ug/min | INTRAVENOUS | Status: AC
  Administered 2013-05-05: 5 ug/min via INTRAVENOUS
  Filled 2013-05-04: qty 250

## 2013-05-04 MED ORDER — DEXTROSE 5 % IV SOLN
1.5000 g | INTRAVENOUS | Status: DC
  Filled 2013-05-04: qty 1.5

## 2013-05-04 MED ORDER — SODIUM CHLORIDE 0.9 % IV SOLN
INTRAVENOUS | Status: AC
  Administered 2013-05-05: 1.1 [IU]/h via INTRAVENOUS
  Filled 2013-05-04: qty 1

## 2013-05-04 MED ORDER — SODIUM CHLORIDE 0.9 % IV SOLN
INTRAVENOUS | Status: DC
  Filled 2013-05-04: qty 30

## 2013-05-04 MED ORDER — AMINOCAPROIC ACID SOLUTION 5% (50 MG/ML)
10.0000 mL | ORAL | Status: AC
  Administered 2013-05-04 (×10): 10 mL via ORAL
  Filled 2013-05-04: qty 100

## 2013-05-04 MED ORDER — EPINEPHRINE HCL 1 MG/ML IJ SOLN
0.5000 ug/min | INTRAVENOUS | Status: DC
  Filled 2013-05-04: qty 4

## 2013-05-04 MED ORDER — CHLORHEXIDINE GLUCONATE 4 % EX LIQD
Freq: Every evening | CUTANEOUS | Status: DC | PRN
  Administered 2013-05-04: 23:00:00 via TOPICAL
  Filled 2013-05-04 (×2): qty 15

## 2013-05-04 NOTE — Progress Notes (Signed)
POST OPERATIVE NOTE:  05/04/2013 Dulce Sellar [id number]  VITALS: BP 120/81  Pulse 79  Temp(Src) 98.8 F (37.1 C) (Oral)  Resp 20  Ht 5' 7.5" (1.715 m)  Wt 187 lb 6.4 oz (85.004 kg)  BMI 28.90 kg/m2  SpO2 95%  LABS:  Lab Results  Component Value Date   WBC 10.1 05/02/2013   HGB 11.9* 05/02/2013   HCT 36.3* 05/02/2013   MCV 76.9* 05/02/2013   PLT 172 05/02/2013   BMET    Component Value Date/Time   NA 137 05/03/2013 0520   K 4.9 05/03/2013 0520   CL 101 05/03/2013 0520   CO2 24 05/03/2013 0520   GLUCOSE 74 05/03/2013 0520   BUN 21 05/03/2013 0520   CREATININE 1.03 05/03/2013 0520   CALCIUM 9.1 05/03/2013 0520   GFRNONAA 76* 05/03/2013 0520   GFRAA 89* 05/03/2013 0520    Lab Results  Component Value Date   INR 1.13 05/04/2013   INR 1.01 05/03/2013   INR 0.99 05/02/2013   No results found for this basename: PTT     [name] [name] is status post extraction of 7 teeth with alveoloplasty and pre-prosthetic surgery as indicated in the operating room with monitored anesthesia care on 05/03/2013.  SUBJECTIVE: Patient currently denies any significant acute dental pain. Patient indicates that he used ice packs throughout the night with good relief of pain. Patient denies having any significant bleeding at this time.   EXAM: There is no sign of infection. There is some heme within his mouth this morning. Sutures are intact. Primary closure is noted in a generalized fashion. Intraoral ecchymoses and swelling are noted.   ASSESSMENT: Post operative course is consistent with dental procedures performed the operating room. Loss of teeth due to extractions. Patient is now edentulous.   PLAN: 1. I will prescribe Amicar 5% rinse. Patient is to rinse with 10 mL was every hour for 10 doses. Patient is to swish and spit. This will prevent fibrinolysis of the blood clots. 2. The patient is to continue chlorhexidine rinses twice a day. Patient is to rinse with 15 MLS after  breakfast and at bedtime. This is to be continued for the next 3 months. 3. Proceed with heart surgery as indicated. 4. Patient to followup for dentist of choice for fabrication of upper lower complete dentures after adequate healing and once medically cleared from the anticipated heart surgery.  Lenn Cal, DDS

## 2013-05-04 NOTE — Progress Notes (Addendum)
Attending Addendum  I examined the patient and discussed the assessment and plan with Dr. Berkley Harvey. I have reviewed the note and agree.  Patient doing well today. No complaints. No oral pain. No oral bleeding. Ready for CABG tomorrow.  NPO post midnight  Initially planned to d/c ASA but after reviewing the literature decided to continue ASA and give a dose pre-cath in following the [place] of [employer]/American Heart Association (ACCF/AHA) guideline for CABG recommended that aspirin be started or continued preoperatively.    R dorsal foot wound. Started as a blister. Wound care consulted for local care, looks like the he would benefit from minor debridement.     [name] [name], [name] [name]

## 2013-05-04 NOTE — Progress Notes (Signed)
Physical Therapy Treatment Patient Details Name: [name] [name] MRN: [id number] DOB: 1951/02/29 Today's Date: 05/04/2013 Time: [id number] PT Time Calculation (min): 26 min  PT Assessment / Plan / Recommendation  History of Present Illness Patient is 62 y.o. male presenting with abdominal discomfort and distention, found to have AKI and markedly elevated LFT's . PMH is significant for CHF, COPD, HTN, and tobacco abuse. Pt with heart and liver failure.  Patient s/p card cath 11/7 - severe 3-vessel disease, NSTEMI, LV systolic dysfunction.   PT Comments   Patient progressing towards goals. Scheduled for CABG tomorrow and was given pre-op education on sternal precautions and practiced them with sit to stand and stand to sit transfers. Needs reinforcement on how to safely perform all actions following precautions and continues to need postural cueing during ambulation with RW.  Follow Up Recommendations        Does the patient have the potential to tolerate intense rehabilitation     Barriers to Discharge        Equipment Recommendations  None recommended by PT    Recommendations for Other Services    Frequency Min 3X/week   Progress towards PT Goals Progress towards PT goals: Progressing toward goals  Plan Other (comment) (CABG sx scheduled for tomorrow (11/14))    Precautions / Restrictions Restrictions Weight Bearing Restrictions: No   Pertinent Vitals/Pain Patient reports minimal pain in right ankle of insidious onset that bothers him at night especially. Denies treatment or history of gout    Mobility  Bed Mobility Bed Mobility: Rolling Right;Right Sidelying to Sit;Sitting - Scoot to [employer] of Bed Rolling Right: 6: Modified independent (Device/Increase time) Right Sidelying to Sit: 6: Modified independent (Device/Increase time) Supine to Sit: 6: Modified independent (Device/Increase time) Sitting - Scoot to Edge of Bed: 6: Modified independent (Device/Increase time) Details for Bed  Mobility Assistance:  Instructed and practiced sternal precautions (not to push throught UE) Transfers Transfers: Sit to Stand;Stand to Sit Sit to Stand: From bed;From toilet;5: Supervision Stand to Sit: To chair/3-in-1;To toilet;5: Supervision Details for Transfer Assistance: Multiple repetition of sit to stand following sternal precautions. Ambulation/Gait Ambulation/Gait Assistance: 5: Supervision Ambulation Distance (Feet): 450 Feet Assistive device: Rolling walker Ambulation/Gait Assistance Details: Max cueing for posture, to step into RW and look up and ahead Gait Pattern: Step-through pattern;Decreased stride length Gait velocity: decreased General Gait Details: Needs cuing to stay in RW and stand up straight and not look down Stairs: No    Exercises     PT Diagnosis:    PT Problem List:   PT Treatment Interventions:     PT Goals (current goals can now be found in the care plan section) Acute Rehab PT Goals Patient Stated Goal: To return home PT Goal Formulation: With patient Potential to Achieve Goals: Good  Visit Information  Last PT Received On: 05/04/13 Assistance Needed: +1 History of Present Illness: Patient is 62 y.o. male presenting with abdominal discomfort and distention, found to have AKI and markedly elevated LFT's . PMH is significant for CHF, COPD, HTN, and tobacco abuse. Pt with heart and liver failure.  Patient s/p card cath 11/7 - severe 3-vessel disease, NSTEMI, LV systolic dysfunction.    Subjective Data  Patient Stated Goal: To return home   Cognition  Cognition Arousal/Alertness: Awake/alert Behavior During Therapy: WFL for tasks assessed/performed Overall Cognitive Status: Within Functional Limits for tasks assessed    Balance  Balance Balance Assessed: No  End of Session PT - End of Session Activity Tolerance: Patient  tolerated treatment well Patient left: in bed;with call bell/phone within reach;with nursing/sitter in room   GP      Anastasia Fiedler SPT 05/04/2013, 3:27 PM

## 2013-05-04 NOTE — Progress Notes (Signed)
Family Medicine Teaching Service Daily Progress Note Intern Pager: [phone]  Patient name: [name] [name] Medical record number: [id number] Date of birth: 03/30/1951 Age: 62 y.o. Gender: male  Primary Care Provider: Fae Pippin Consultants: Cardiology, Cardiothoracic Surgery, PT, Dental Code Status: Full  Pt Overview and Major Events to Date:  04/23/13: Increased LFT's from previous day 04/26/13: Hepatic u/s shows no hepatic/portal vein clots. 04/28/13: cardiac cath shows triple vessel disease 05/02/13: spironolactone added to regimen 05/03/13: Dental extraction  Assessment and Plan: [name] [name] is a 62 y.o. male presenting with abdominal discomfort and distention, found to have AKI and markedly elevated LFT's . PMH is significant for CHF, COPD, HTN, and tobacco abuse. Currently improving.  #CAD: 3 vessel disease found on cath with severe occlusion of RCA. CABG this Thursday (11/13) or Friday (11/14) - CT surgery is following and will determine patient's candidacy for CABG - follow-up CT surgery recommendations; will consult dental today  # CHF: EF: 20-25% which has worsened from previous 45-50% in 2012. PA pressure: 11mmHg. Weight on admission: 215lbs. Currently diuresing well. 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 - UOP in last 24 hours: 3.6L (~1.8 ml/kg/hr)  - Weight today is 187lbs (down 28 lbs from admit) - cardiology recs:  - continue carvedilol: continue 6.25 BID (11/9)  - continue lisinopril: 5mg   - continue digoxin  - continue lasix 60mg  PO BID  - continue spironolactone (watch potassium: 4.9) - follow-up cardiology recommendations  - Cardiac Rehab following  # Transaminitis with elevated bilirubin, mild ascites: repeat LFT's trending down with AST/ALT: 32/83 (11/13) and tbili of 2.0. Appears to be more related to RHF. INR stable and currently 1.13. He is improving overall - hold home colchicine, amitiza and allopurinol -  follow-up daily INR and hepatic function - PT recommends home health PT (ordered)  # Hx of SOB: Resolved. Most likely due to CHF. - continue albuterol, spiriva - continue to monitor on telemetry   # Right foot wound: being managed by wound care as an outpatient.  - wound care in hospital  # Right leg swelling and erythema: appears to be resolved  # Diabetes: pt reports he has DM, not on any medications per med list. CBG's in 70's-100s. A1C is 5.7  # Gout: hold colchicine, hold allopurinol  # Tobacco abuse: will provide nicotine patch per pt request   FEN/GI: clear liquid diet Prophylaxis: SCDs  Disposition: CABG on Friday  Subjective: He is s/p dental extraction. He endorsed some mouth pain improved from yesterday.. Otherwise, he continues to do well. No problems or complaints overnight. He continues to feel better overall. No chest pain, shortness of breath or abdominal pain.  Objective: Temp:  [97.5 F (36.4 C)-99.2 F (37.3 C)] 98.6 F (37 C) (11/13 0908) Pulse Rate:  [79-87] 87 (11/13 0908) Resp:  [18-24] 20 (11/13 0908) BP: (98-120)/(55-81) 106/64 mmHg (11/13 0908) SpO2:  [94 %-100 %] 94 % (11/13 0908) Weight:  [187 lb 6.4 oz (85.004 kg)] 187 lb 6.4 oz (85.004 kg) (11/13 0526)  Physical Exam: General: no acute distress, comfortable in bed.  HEENT: no dentition Cardiovascular: S1S2, RRR, no murmur Respiratory: clear to auscultation normal work of breathing, no crackles appreciated Abdomen: soft, non tender, distended but improving, +BS Extremities: Non-erythematous bilaterally, legs equal in warmth, no tenderness, no edema bilaterally Neuro: Alert and oriented x3  Laboratory:  Recent Labs Lab 04/28/13 0525 05/01/13 0356 05/02/13 1345  WBC 8.6 12.7* 10.1  HGB 10.6* 12.2* 11.9*  HCT 32.8* 37.7* 36.3*  PLT 167 188 172    Recent Labs Lab 05/02/13 0545 05/03/13 0520 05/04/13 0529  NA 136 137 136  K 4.1 4.9 4.0  CL 101 101 99  CO2 25 24 26   BUN 21 21  19   CREATININE 1.09 1.03 0.99  CALCIUM 9.0 9.1 9.6  PROT 7.0 7.4 8.3  BILITOT 1.7* 1.5* 2.0*  ALKPHOS 81 71 78  ALT 132* 103* 83*  AST 40* 68* 32  GLUCOSE 89 74 91    Recent Labs Lab 04/30/13 0411 05/01/13 0356 05/02/13 0545 05/03/13 0520 05/04/13 0529  INR 1.27 1.24 0.99 1.01 1.13    Imaging/Diagnostic Tests:  No recent imaging   Medications: Scheduled Meds: . aminocaproic acid  10 mL Oral Q1H  . aspirin EC  81 mg Oral Daily  . carvedilol  6.25 mg Oral BID WC  . digoxin  0.125 mg Oral Daily  . feeding supplement (ENSURE COMPLETE)  237 mL Oral BID BM  . furosemide  60 mg Oral BID  . lisinopril  5 mg Oral Daily  . nicotine  7 mg Transdermal Daily  . pantoprazole  40 mg Oral Daily  . polyethylene glycol  17 g Oral Daily  . simvastatin  20 mg Oral q1800  . sodium chloride  3 mL Intravenous Q12H  . spironolactone  12.5 mg Oral Daily  . tiotropium  18 mcg Inhalation Daily   Continuous Infusions: . lactated ringers Stopped (05/03/13 1300)   PRN Meds:.acetaminophen, albuterol, HYDROcodone-acetaminophen, ondansetron (ZOFRAN) IV, ondansetron, oxyCODONE-acetaminophen   Phill Myron, MD 05/04/2013, 9:53 AM PGY-1, [place] Intern pager: [phone], text pages welcome

## 2013-05-04 NOTE — Progress Notes (Signed)
Seen and agree with SPT note Glennie Rodda [employer], PT [phone]  

## 2013-05-04 NOTE — Progress Notes (Signed)
1 Day Post-Op Procedure(s) (LRB): Extraction of tooth #s 3,4,5,6,8,9,27 with alveoloplast and maxillary left osseous tuberosity reduction (N/A) Subjective: No complaints. Some bleeding from extractions earlier but improved now No CP or SOB  Objective: Vital signs in last 24 hours: Temp:  [98.6 F (37 C)-99.2 F (37.3 C)] 98.6 F (37 C) (11/13 0908) Pulse Rate:  [79-87] 80 (11/13 1208) Cardiac Rhythm:  [-] Normal sinus rhythm (11/13 0759) Resp:  [18-20] 20 (11/13 0908) BP: (100-120)/(60-81) 108/70 mmHg (11/13 1209) SpO2:  [94 %-100 %] 94 % (11/13 0908) Weight:  [187 lb 6.4 oz (85.004 kg)] 187 lb 6.4 oz (85.004 kg) (11/13 0526)  Hemodynamic parameters for last 24 hours:    Intake/Output from previous day: 11/12 0701 - 11/13 0700 In: 480 [P.O.:480] Out: 3625 [Urine:3625] Intake/Output this shift: Total I/O In: 720 [P.O.:720] Out: 500 [Urine:500]  General appearance: alert and no distress Neurologic: intact Heart: regular rate and rhythm Lungs: diminished breath sounds bibasilar Extremities: no edema  Lab Results:  Recent Labs  05/02/13 1345 05/04/13 1040  WBC 10.1 8.3  HGB 11.9* 11.8*  HCT 36.3* 37.1*  PLT 172 203   BMET:  Recent Labs  05/03/13 0520 05/04/13 0529  NA 137 136  K 4.9 4.0  CL 101 99  CO2 24 26  GLUCOSE 74 91  BUN 21 19  CREATININE 1.03 0.99  CALCIUM 9.1 9.6    PT/INR:  Recent Labs  05/04/13 0529  LABPROT 14.3  INR 1.13   ABG    Component Value Date/Time   PHART 7.424 04/28/2013 0805   HCO3 31.9* 04/28/2013 0813   TCO2 33 04/28/2013 0813   O2SAT 68.0 04/28/2013 0813   CBG (last 3)   Recent Labs  05/03/13 1001  GLUCAP 101*   VASCULAR LABS Summary: Bilateral: mild intimal wall thickening CCA. Mild soft plaque origin ICA. 1-39% ICA stenosis. Vertebral artery flow is antegrade. ICA/CCA ratio: R-1.0 L-0.97  The right ABI is 0.57, the left is 0.68, suggestive of moderate arterial insufficiency bilaterally.  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  Brachial pressures:  +--------+-----+----+---+  RightLeftMax +--------+-----+----+---+ Systolic114 [id number] +--------+-----+----+---+  ECHOCARDIOGRAM Study Conclusions  - Left ventricle: Diffuse hypokinesis inferobasal akinesis The cavity size was severely dilated. Wall thickness was normal. Systolic function was severely reduced. The estimated ejection fraction was in the range of 20% to 25%. - Mitral valve: Mild regurgitation. - Left atrium: The atrium was moderately dilated. - Right ventricle: The cavity size was mildly dilated. - Right atrium: The atrium was mildly dilated. - Atrial septum: No defect or patent foramen ovale was identified. - Tricuspid valve: Mild-moderate regurgitation. - Pulmonary arteries: PA peak pressure: 66mm Hg (S).   Assessment/Plan: S/P Procedure(s) (LRB): Extraction of tooth #s [phone] with alveoloplast and maxillary left osseous tuberosity reduction (N/A) For CABG in AM, will plan intraoperative TEE to assess MV and TV Should be fine to use LIMA, may have right SCA stenosis by pressures Will plan to harvest vein from both thighs and stay above knees if possible He's in about as good shape medically as we are likely to achieve prior to revascularization All questions answered   LOS: 12 days    HENDRICKSON,STEVEN C 05/04/2013

## 2013-05-04 NOTE — Progress Notes (Signed)
Patient ID: [name] [name], male   DOB: 1951-02-29, 62 y.o.   MRN: [id number]    SUBJECTIVE: Feels ok. Denies CP or dyspnea. Teeth extractions yesterday.   Cath last week with severe 3V CAD. RHC as below  Ao: 105/59  LV: 110/10/19  RA: 13  RV: 42/5//13  PA: 47/15 (mean 31)  PCWP: 19  Fick Cardiac Output: 10 L/min  Fick Cardiac Index: 4.86 L/min/m2  PVR: 1.2 WU Central Aortic Saturation: 87%  Pulmonary Artery Saturation: 68%  Medications   . aminocaproic acid  10 mL Oral Q1H  . carvedilol  6.25 mg Oral BID WC  . digoxin  0.125 mg Oral Daily  . feeding supplement (ENSURE COMPLETE)  237 mL Oral BID BM  . furosemide  60 mg Oral BID  . lisinopril  5 mg Oral Daily  . nicotine  7 mg Transdermal Daily  . pantoprazole  40 mg Oral Daily  . polyethylene glycol  17 g Oral Daily  . simvastatin  20 mg Oral q1800  . sodium chloride  3 mL Intravenous Q12H  . spironolactone  12.5 mg Oral Daily  . tiotropium  18 mcg Inhalation Daily     Filed Vitals:   05/04/13 0759 05/04/13 0908 05/04/13 1208 05/04/13 1209  BP:  106/64  108/70  Pulse:  87 80   Temp:  98.6 F (37 C)    TempSrc:  Oral    Resp:  20    Height:      Weight:      SpO2: 100% 94%      Intake/Output Summary (Last 24 hours) at 05/04/13 1355 Last data filed at 05/04/13 1335  Gross per 24 hour  Intake   1200 ml  Output   3725 ml  Net  -2525 ml    LABS: Basic Metabolic Panel:  Recent Labs  05/03/13 0520 05/04/13 0529  NA 137 136  K 4.9 4.0  CL 101 99  CO2 24 26  GLUCOSE 74 91  BUN 21 19  CREATININE 1.03 0.99  CALCIUM 9.1 9.6   Liver Function Tests:  Recent Labs  05/03/13 0520 05/04/13 0529  AST 68* 32  ALT 103* 83*  ALKPHOS 71 78  BILITOT 1.5* 2.0*  PROT 7.4 8.3  ALBUMIN 2.8* 3.1*   No results found for this basename: LIPASE, AMYLASE,  in the last 72 hours CBC:  Recent Labs  05/02/13 1345 05/04/13 1040  WBC 10.1 8.3  NEUTROABS 5.3  --   HGB 11.9* 11.8*  HCT 36.3* 37.1*  MCV 76.9* 77.9*    PLT 172 203    RADIOLOGY: Ct Abdomen Pelvis Wo Contrast  04/22/2013   CLINICAL DATA:  Abdominal pain and distention.  EXAM: CT ABDOMEN AND PELVIS WITHOUT CONTRAST  TECHNIQUE: Multidetector CT imaging of the abdomen and pelvis was performed following the standard protocol without intravenous contrast.  COMPARISON:  04/21/2013  FINDINGS: Mild ascites again seen within the abdomen and pelvis, without significant change. The liver, gallbladder, spleen, pancreas, adrenal glands, and kidneys are unremarkable in appearance except for small right renal cyst. Vicarious excretion of contrast noted in the gallbladder as well as mild residual contrast enhancement of both kidneys from the most recent CT. No soft tissue masses or lymphadenopathy identified within the abdomen or pelvis. Residual contrast seen in the urinary bladder.  Diverticulosis is seen mainly involving the sigmoid colon, however there is no evidence of diverticulitis. No focal inflammatory process or abscess identified. No evidence of bowel obstruction or hernia.  IMPRESSION: Mild ascites, without significant change since prior exam.  Diverticulosis. No radiographic evidence of diverticulitis.   Electronically Signed   By: Earle Gell M.D.   On: 04/22/2013 20:39   Dg Chest 2 View  04/22/2013   CLINICAL DATA:  Chest pain and vomiting  EXAM: CHEST  2 VIEW  COMPARISON:  04/21/2013  FINDINGS: The heart is again enlarged in size but stable. The lungs are well aerated bilaterally. Some very mild patchy changes are noted in the right upper lobe which may represent some early infiltrate.  IMPRESSION: Stable cardiomegaly.  New patchy changes in the right upper lobe which may represent an early infiltrate.   Electronically Signed   By: Inez Catalina M.D.   On: 04/22/2013 14:51   US Abdomen Complete  04/22/2013   CLINICAL DATA:  Abdominal pain.  EXAM: ULTRASOUND ABDOMEN COMPLETE  COMPARISON:  None.  FINDINGS: Gallbladder  No gallstones identified. Mild  diffuse gallbladder wall thickening seen measuring 4 mm, however no sonographic Murphy sign was noted.  Common bile duct  Diameter: 3 mm  Liver  No focal lesion identified. Within normal limits in parenchymal echogenicity. Mild perihepatic ascites noted.  IVC  No abnormality visualized.  Pancreas  Visualized portion unremarkable.  Spleen  Size and appearance within normal limits.  Right Kidney  Length: 10.3 cm. Echogenicity within normal limits. No mass or hydronephrosis visualized.  Left Kidney  Length: 10.4 cm. Echogenicity within normal limits. No mass or hydronephrosis visualized.  Abdominal aorta  No aneurysm visualized.  IMPRESSION: No evidence of gallstones or biliary dilatation.  Mild diffuse gallbladder wall thickening, which is a nonspecific finding. Mild ascites also noted.  Unremarkable sonographic appearance of the liver.   Electronically Signed   By: Earle Gell M.D.   On: 04/22/2013 16:46   Dg Chest Port 1 View  04/23/2013   CLINICAL DATA:  62 year old male with reason chest pain and vomiting. Fluid status.  EXAM: PORTABLE CHEST - 1 VIEW  COMPARISON:  04/22/2013 and earlier.  FINDINGS: Portable AP semi upright view at 2056 hrs. Stable cardiomegaly and mediastinal contours. Previously seen subtle patchy right lung opacity does not persist. No pulmonary edema, pleural effusion, pneumothorax, or consolidation. Stable visualized osseous structures.  IMPRESSION: Interval resolved nonspecific patchy pulmonary opacity. Stable cardiomegaly and no acute cardiopulmonary abnormality.   Electronically Signed   By: Lars Pinks M.D.   On: 04/23/2013 21:09    PHYSICAL EXAM General: NAD HEENT: normal except for poor dentition Neck: JVP 7 cm, no thyromegaly or thyroid nodule.  Lungs: Decreased breath sounds at bases. CV: Nondisplaced PMI.  Heart regular S1/S2, no S3/S4, 1/6 HSM LLSB.  No edema.  No carotid bruit.   Abdomen: Soft, nontender, no hepatosplenomegaly mild distention (improving).  Neurologic: A&O  x 3 Psych: Normal affect. Extremities: No clubbing or cyanosis.   TELEMETRY: Reviewed telemetry pt in NSR  ASSESSMENT AND PLAN: 62 yo with history of COPD, diastolic CHF, and HTN presented with abdominal distention and elevated LFTs in setting of ADHF  Echo showed EF 20-25% with moderate RV dysfunction.  1. Elevated LFTs:  Much improved. Likely due to congestion from HF as well as shock liver. May have component of fatty liver. Hepatitis panels negative.  2. Acute on chronic systolic CHF: EF 0000000 on echo with moderate RV dysfunction 3. AKI on probable CKD.  Resolved, creatinine back to normal range.  4. 3V CAD  Volume status much improved. Cardiac output better than expected. Evidence of RV dysfunction on  RHC. Now on Lasix 60 mg po bid.  Continue lisinopril, Coreg, and spironolactone at current doses and follow K.  Plan for CABG tomorrow.     Started low dose statin for CAD being mindful of elevated LFTs.   Loralie Champagne 05/04/2013 1:55 PM

## 2013-05-04 NOTE — Progress Notes (Signed)
CARDIAC REHAB PHASE I   PRE:  Rate/Rhythm: 95 SR  BP:  Supine:   Sitting: 115/75  Standing:    SaO2: 98 RA  MODE:  Ambulation: 460 ft   POST:  Rate/Rhythm: 90  BP:  Supine:   Sitting: 113/74  Standing:     SaO2: 99 RA 0945-1030 Assisted X 1 and used walker to ambulate. Pt c/o of pain in right ankle with weight bearing, he limped on it with walking. Pt was able to walk 460 feet without c/o of cp or SOB. Pt back to recliner after walk with call light in reach.  Rodney Langton RN 05/04/2013 10:44 AM

## 2013-05-04 NOTE — Progress Notes (Deleted)
Physical Therapy Treatment Patient Details Name: [name] [name] MRN: [id number] DOB: 1951-06-14 Today's Date: 05/04/2013 Time: [id number] PT Time Calculation (min): 26 min  PT Assessment / Plan / Recommendation  History of Present Illness Patient is 62 y.o. male presenting with abdominal discomfort and distention, found to have AKI and markedly elevated LFT's . PMH is significant for CHF, COPD, HTN, and tobacco abuse. Pt with heart and liver failure.  Patient s/p card cath 11/7 - severe 3-vessel disease, NSTEMI, LV systolic dysfunction.   PT Comments   Patient progressing towards goals. Scheduled for CABG tomorrow and was given pre-op education on sternal precautions and practiced them with sit to stand and stand to sit transfers. Needs reinforcement on how to safely perform all actions following precautions and continues to need postural cuing during ambulation with RW.  Follow Up Recommendations        Does the patient have the potential to tolerate intense rehabilitation     Barriers to Discharge        Equipment Recommendations  None recommended by PT    Recommendations for Other Services    Frequency Min 3X/week   Progress towards PT Goals Progress towards PT goals: Progressing toward goals  Plan Other (comment) (CABG sx scheduled for tomorrow (11/14))    Precautions / Restrictions Restrictions Weight Bearing Restrictions: No   Pertinent Vitals/Pain Patient reports minimal pain in right ankle of insidious onset that bothers him at night especially. Denies treatment or history of gout.    Mobility  Bed Mobility Bed Mobility: Rolling Right;Right Sidelying to Sit;Sitting - Scoot to [employer] of Bed Rolling Right: 6: Modified independent (Device/Increase time) Right Sidelying to Sit: 6: Modified independent (Device/Increase time) Supine to Sit: 6: Modified independent (Device/Increase time) Sitting - Scoot to Edge of Bed: 6: Modified independent (Device/Increase time) Details for Bed  Mobility Assistance: cues for safety Transfers Transfers: Sit to Stand;Stand to Sit Sit to Stand: 6: Modified independent (Device/Increase time);From bed;From toilet Stand to Sit: 6: Modified independent (Device/Increase time);To chair/3-in-1;To toilet Details for Transfer Assistance: Multiple repetition of STS with cueing for no use of force through UE behind back to practice following sternal precautions. Ambulation/Gait Ambulation/Gait Assistance: 5: Supervision Ambulation Distance (Feet): 450 Feet Assistive device: Rolling walker Ambulation/Gait Assistance Details: Max cuing for posture, to step into RB and look up and ahead Gait Pattern: Step-through pattern;Decreased stride length Gait velocity: decreased General Gait Details: Needs cuing to stay in RW and stand up straight and not look down Stairs: No    Exercises     PT Diagnosis:    PT Problem List:   PT Treatment Interventions:     PT Goals (current goals can now be found in the care plan section) Acute Rehab PT Goals Patient Stated Goal: To return home PT Goal Formulation: With patient Potential to Achieve Goals: Good  Visit Information  Last PT Received On: 05/04/13 Assistance Needed: +1 History of Present Illness: Patient is 62 y.o. male presenting with abdominal discomfort and distention, found to have AKI and markedly elevated LFT's . PMH is significant for CHF, COPD, HTN, and tobacco abuse. Pt with heart and liver failure.  Patient s/p card cath 11/7 - severe 3-vessel disease, NSTEMI, LV systolic dysfunction.    Subjective Data  Patient Stated Goal: To return home   Cognition  Cognition Arousal/Alertness: Awake/alert Behavior During Therapy: WFL for tasks assessed/performed Overall Cognitive Status: Within Functional Limits for tasks assessed    Balance  Balance Balance Assessed: No  End of  Session PT - End of Session Activity Tolerance: Patient tolerated treatment well Patient left: in bed;with call  bell/phone within reach;with nursing/sitter in room   GP     Anastasia Fiedler, SPT 05/04/2013, 2:28 PM

## 2013-05-05 ENCOUNTER — Inpatient Hospital Stay (HOSPITAL_COMMUNITY): Payer: Medicaid Other | Admitting: Certified Registered Nurse Anesthetist

## 2013-05-05 ENCOUNTER — Encounter (HOSPITAL_COMMUNITY)
Admission: EM | Disposition: A | Discharge status: Home / Self Care | Payer: Medicaid Other | Source: Home / Self Care | Attending: Family Medicine

## 2013-05-05 ENCOUNTER — Encounter (HOSPITAL_COMMUNITY): Payer: Medicaid Other | Admitting: Certified Registered Nurse Anesthetist

## 2013-05-05 ENCOUNTER — Inpatient Hospital Stay (HOSPITAL_COMMUNITY): Payer: Medicaid Other

## 2013-05-05 DIAGNOSIS — I2510 Atherosclerotic heart disease of native coronary artery without angina pectoris: Secondary | ICD-10-CM

## 2013-05-05 HISTORY — PX: CORONARY ARTERY BYPASS GRAFT: SHX141

## 2013-05-05 LAB — MAGNESIUM: Magnesium: 3.1 mg/dL — ABNORMAL HIGH (ref 1.5–2.5)

## 2013-05-05 LAB — POCT I-STAT, CHEM 8
BUN: 15 mg/dL (ref 6–23)
Calcium, Ion: 1.3 mmol/L (ref 1.13–1.30)
Chloride: 105 mEq/L (ref 96–112)
Creatinine, Ser: 1 mg/dL (ref 0.50–1.35)
Glucose, Bld: 130 mg/dL — ABNORMAL HIGH (ref 70–99)
HCT: 32 % — ABNORMAL LOW (ref 39.0–52.0)
Sodium: 139 mEq/L (ref 135–145)
TCO2: 23 mmol/L (ref 0–100)

## 2013-05-05 LAB — POCT I-STAT 3, ART BLOOD GAS (G3+)
Acid-Base Excess: 3 mmol/L — ABNORMAL HIGH (ref 0.0–2.0)
Acid-Base Excess: 2 mmol/L (ref 0.0–2.0)
Acid-base deficit: 1 mmol/L (ref 0.0–2.0)
Bicarbonate: 27.1 mEq/L — ABNORMAL HIGH (ref 20.0–24.0)
Bicarbonate: 27.5 mEq/L — ABNORMAL HIGH (ref 20.0–24.0)
Bicarbonate: 24.1 mEq/L — ABNORMAL HIGH (ref 20.0–24.0)
O2 Saturation: 100 %
O2 Saturation: 100 %
O2 Saturation: 99 %
Patient temperature: 35.4
TCO2: 30 mmol/L (ref 0–100)
TCO2: 28 mmol/L (ref 0–100)
TCO2: 29 mmol/L (ref 0–100)
TCO2: 25 mmol/L (ref 0–100)
TCO2: 25 mmol/L (ref 0–100)
pCO2 arterial: 34.1 mmHg — ABNORMAL LOW (ref 35.0–45.0)
pCO2 arterial: 42.4 mmHg (ref 35.0–45.0)
pH, Arterial: 7.424 (ref 7.350–7.450)
pH, Arterial: 7.367 (ref 7.350–7.450)
pO2, Arterial: 388 mmHg — ABNORMAL HIGH (ref 80.0–100.0)

## 2013-05-05 LAB — POCT I-STAT 4, (NA,K, GLUC, HGB,HCT)
Glucose, Bld: 93 mg/dL (ref 70–99)
Glucose, Bld: 110 mg/dL — ABNORMAL HIGH (ref 70–99)
Glucose, Bld: 107 mg/dL — ABNORMAL HIGH (ref 70–99)
Glucose, Bld: 123 mg/dL — ABNORMAL HIGH (ref 70–99)
Glucose, Bld: 127 mg/dL — ABNORMAL HIGH (ref 70–99)
Glucose, Bld: 136 mg/dL — ABNORMAL HIGH (ref 70–99)
HCT: 36 % — ABNORMAL LOW (ref 39.0–52.0)
HCT: 37 % — ABNORMAL LOW (ref 39.0–52.0)
HCT: 29 % — ABNORMAL LOW (ref 39.0–52.0)
HCT: 36 % — ABNORMAL LOW (ref 39.0–52.0)
HCT: 30 % — ABNORMAL LOW (ref 39.0–52.0)
HCT: 33 % — ABNORMAL LOW (ref 39.0–52.0)
Hemoglobin: 12.2 g/dL — ABNORMAL LOW (ref 13.0–17.0)
Hemoglobin: 9.9 g/dL — ABNORMAL LOW (ref 13.0–17.0)
Hemoglobin: 9.2 g/dL — ABNORMAL LOW (ref 13.0–17.0)
Hemoglobin: 11.2 g/dL — ABNORMAL LOW (ref 13.0–17.0)
Potassium: 3.8 mEq/L (ref 3.5–5.1)
Potassium: 3.7 mEq/L (ref 3.5–5.1)
Potassium: 3.7 mEq/L (ref 3.5–5.1)
Potassium: 4.3 mEq/L (ref 3.5–5.1)
Potassium: 4.8 mEq/L (ref 3.5–5.1)
Potassium: 3.9 mEq/L (ref 3.5–5.1)
Sodium: 137 mEq/L (ref 135–145)
Sodium: 136 mEq/L (ref 135–145)
Sodium: 136 mEq/L (ref 135–145)
Sodium: 138 mEq/L (ref 135–145)

## 2013-05-05 LAB — CBC
HCT: 29.4 % — ABNORMAL LOW (ref 39.0–52.0)
HCT: 28.9 % — ABNORMAL LOW (ref 39.0–52.0)
Hemoglobin: 9.4 g/dL — ABNORMAL LOW (ref 13.0–17.0)
Hemoglobin: 9.2 g/dL — ABNORMAL LOW (ref 13.0–17.0)
MCH: 24.8 pg — ABNORMAL LOW (ref 26.0–34.0)
MCH: 24.8 pg — ABNORMAL LOW (ref 26.0–34.0)
MCHC: 32 g/dL (ref 30.0–36.0)
MCHC: 31.8 g/dL (ref 30.0–36.0)
MCV: 77.6 fL — ABNORMAL LOW (ref 78.0–100.0)
Platelets: 138 10*3/uL — ABNORMAL LOW (ref 150–400)
RBC: 3.71 MIL/uL — ABNORMAL LOW (ref 4.22–5.81)

## 2013-05-05 LAB — GLUCOSE, CAPILLARY
Glucose-Capillary: 147 mg/dL — ABNORMAL HIGH (ref 70–99)
Glucose-Capillary: 136 mg/dL — ABNORMAL HIGH (ref 70–99)
Glucose-Capillary: 89 mg/dL (ref 70–99)
Glucose-Capillary: 143 mg/dL — ABNORMAL HIGH (ref 70–99)
Glucose-Capillary: 133 mg/dL — ABNORMAL HIGH (ref 70–99)

## 2013-05-05 LAB — COMPREHENSIVE METABOLIC PANEL
ALT: 57 U/L — ABNORMAL HIGH (ref 0–53)
AST: 25 U/L (ref 0–37)
Albumin: 2.7 g/dL — ABNORMAL LOW (ref 3.5–5.2)
CO2: 26 mEq/L (ref 19–32)
Calcium: 8.8 mg/dL (ref 8.4–10.5)
Sodium: 135 mEq/L (ref 135–145)
Total Bilirubin: 1.2 mg/dL (ref 0.3–1.2)
Total Protein: 7.1 g/dL (ref 6.0–8.3)

## 2013-05-05 LAB — PROTIME-INR
INR: 1.08 (ref 0.00–1.49)
INR: 1.57 — ABNORMAL HIGH (ref 0.00–1.49)
Prothrombin Time: 13.8 seconds (ref 11.6–15.2)
Prothrombin Time: 18.3 seconds — ABNORMAL HIGH (ref 11.6–15.2)

## 2013-05-05 LAB — HEMOGLOBIN AND HEMATOCRIT, BLOOD: HCT: 24.5 % — ABNORMAL LOW (ref 39.0–52.0)

## 2013-05-05 LAB — HEMOGLOBIN A1C
Hgb A1c MFr Bld: 5.5 % (ref <5.7)
Mean Plasma Glucose: 111 mg/dL (ref <117)

## 2013-05-05 LAB — CREATININE, SERUM
GFR calc Af Amer: >90 mL/min (ref >90)
GFR calc non Af Amer: >90 mL/min (ref >90)

## 2013-05-05 LAB — PLATELET COUNT: Platelets: 123 10*3/uL — ABNORMAL LOW (ref 150–400)

## 2013-05-05 SURGERY — CORONARY ARTERY BYPASS GRAFTING (CABG)
Anesthesia: General | Site: Chest | Wound class: Clean

## 2013-05-05 MED ORDER — INSULIN REGULAR BOLUS VIA INFUSION
0.0000 [IU] | Freq: Three times a day (TID) | INTRAVENOUS | Status: DC
  Filled 2013-05-05: qty 10

## 2013-05-05 MED ORDER — ONDANSETRON HCL 4 MG/2ML IJ SOLN: 4.0000 mg | Freq: Four times a day (QID) | INTRAMUSCULAR | Status: DC | PRN

## 2013-05-05 MED ORDER — METOPROLOL TARTRATE 12.5 MG HALF TABLET
12.5000 mg | ORAL_TABLET | Freq: Two times a day (BID) | ORAL | Status: DC
  Administered 2013-05-06 – 2013-05-07 (×3): 12.5 mg via ORAL
  Filled 2013-05-05 (×5): qty 1

## 2013-05-05 MED ORDER — MIDAZOLAM HCL 5 MG/5ML IJ SOLN
INTRAMUSCULAR | Status: DC | PRN
  Administered 2013-05-05: 2 mg via INTRAVENOUS
  Administered 2013-05-05: 4 mg via INTRAVENOUS
  Administered 2013-05-05: 1 mg via INTRAVENOUS
  Administered 2013-05-05: 3 mg via INTRAVENOUS
  Administered 2013-05-05: 2 mg via INTRAVENOUS

## 2013-05-05 MED ORDER — LEVOFLOXACIN IN D5W 500 MG/100ML IV SOLN
500.0000 mg | Freq: Once | INTRAVENOUS | Status: AC
  Administered 2013-05-06: 500 mg via INTRAVENOUS
  Filled 2013-05-05: qty 100

## 2013-05-05 MED ORDER — LEVOFLOXACIN IN D5W 500 MG/100ML IV SOLN
INTRAVENOUS | Status: DC | PRN
  Administered 2013-05-05: 500 mg via INTRAVENOUS

## 2013-05-05 MED ORDER — AMIODARONE LOAD VIA INFUSION
150.0000 mg | Freq: Once | INTRAVENOUS | Status: AC
  Administered 2013-05-05: 150 mg via INTRAVENOUS
  Filled 2013-05-05: qty 83.34

## 2013-05-05 MED ORDER — AMIODARONE HCL IN DEXTROSE 360-4.14 MG/200ML-% IV SOLN
INTRAVENOUS | Status: AC
  Administered 2013-05-05: 200 mL
  Filled 2013-05-05: qty 200

## 2013-05-05 MED ORDER — SODIUM CHLORIDE 0.9 % IJ SOLN
OROMUCOSAL | Status: DC | PRN
  Administered 2013-05-05 (×3): via TOPICAL

## 2013-05-05 MED ORDER — LACTATED RINGERS IV SOLN
INTRAVENOUS | Status: DC
Start: 2013-05-05 — End: 2013-05-07
  Administered 2013-05-05 (×2): via INTRAVENOUS

## 2013-05-05 MED ORDER — SODIUM CHLORIDE 0.9 % IV SOLN
250.0000 mL | INTRAVENOUS | Status: DC
  Administered 2013-05-06: 250 mL via INTRAVENOUS

## 2013-05-05 MED ORDER — MAGNESIUM SULFATE 40 MG/ML IJ SOLN
4.0000 g | Freq: Once | INTRAMUSCULAR | Status: AC
  Administered 2013-05-05: 4 g via INTRAVENOUS
  Filled 2013-05-05: qty 100

## 2013-05-05 MED ORDER — MIDAZOLAM HCL 2 MG/2ML IJ SOLN: 2.0000 mg | INTRAMUSCULAR | Status: DC | PRN

## 2013-05-05 MED ORDER — SODIUM CHLORIDE 0.9 % IJ SOLN
3.0000 mL | Freq: Two times a day (BID) | INTRAMUSCULAR | Status: DC
  Administered 2013-05-06: 3 mL via INTRAVENOUS

## 2013-05-05 MED ORDER — DEXTROSE 5 % IV SOLN: 1.5000 g | Freq: Two times a day (BID) | INTRAVENOUS | Status: DC

## 2013-05-05 MED ORDER — MORPHINE SULFATE 2 MG/ML IJ SOLN: 1.0000 mg | INTRAMUSCULAR | Status: AC | PRN

## 2013-05-05 MED ORDER — ACETAMINOPHEN 160 MG/5ML PO SOLN
1000.0000 mg | Freq: Four times a day (QID) | ORAL | Status: DC
  Administered 2013-05-05: 1000 mg
  Filled 2013-05-05: qty 40.6

## 2013-05-05 MED ORDER — HEPARIN SODIUM (PORCINE) 1000 UNIT/ML IJ SOLN
INTRAMUSCULAR | Status: DC | PRN
  Administered 2013-05-05: 2000 [IU] via INTRAVENOUS
  Administered 2013-05-05: 29000 [IU] via INTRAVENOUS

## 2013-05-05 MED ORDER — PHENYLEPHRINE HCL 10 MG/ML IJ SOLN
0.0000 ug/min | INTRAVENOUS | Status: DC
  Filled 2013-05-05: qty 2

## 2013-05-05 MED ORDER — ASPIRIN EC 325 MG PO TBEC
325.0000 mg | DELAYED_RELEASE_TABLET | Freq: Every day | ORAL | Status: DC
  Administered 2013-05-06 – 2013-05-07 (×2): 325 mg via ORAL
  Filled 2013-05-05 (×2): qty 1

## 2013-05-05 MED ORDER — ROCURONIUM BROMIDE 100 MG/10ML IV SOLN
INTRAVENOUS | Status: DC | PRN
  Administered 2013-05-05: 60 mg via INTRAVENOUS
  Administered 2013-05-05 (×2): 20 mg via INTRAVENOUS

## 2013-05-05 MED ORDER — SODIUM CHLORIDE 0.9 % IJ SOLN
3.0000 mL | INTRAMUSCULAR | Status: DC | PRN
  Administered 2013-05-06 (×2): 3 mL via INTRAVENOUS

## 2013-05-05 MED ORDER — VANCOMYCIN HCL IN DEXTROSE 1-5 GM/200ML-% IV SOLN
1000.0000 mg | Freq: Once | INTRAVENOUS | Status: AC
  Administered 2013-05-05: 1000 mg via INTRAVENOUS
  Filled 2013-05-05: qty 200

## 2013-05-05 MED ORDER — DOPAMINE-DEXTROSE 3.2-5 MG/ML-% IV SOLN: 0.0000 ug/kg/min | INTRAVENOUS | Status: DC

## 2013-05-05 MED ORDER — PROTAMINE SULFATE 10 MG/ML IV SOLN
INTRAVENOUS | Status: DC | PRN
  Administered 2013-05-05: 290 mg via INTRAVENOUS

## 2013-05-05 MED ORDER — LACTATED RINGERS IV SOLN: 500.0000 mL | Freq: Once | INTRAVENOUS | Status: AC | PRN

## 2013-05-05 MED ORDER — AMIODARONE HCL IN DEXTROSE 360-4.14 MG/200ML-% IV SOLN
30.0000 mg/h | INTRAVENOUS | Status: DC
  Administered 2013-05-05 – 2013-05-06 (×2): 30 mg/h via INTRAVENOUS
  Filled 2013-05-05 (×4): qty 200

## 2013-05-05 MED ORDER — POTASSIUM CHLORIDE 10 MEQ/50ML IV SOLN
10.0000 meq | INTRAVENOUS | Status: AC
  Administered 2013-05-05 (×3): 10 meq via INTRAVENOUS

## 2013-05-05 MED ORDER — METOPROLOL TARTRATE 1 MG/ML IV SOLN
2.5000 mg | INTRAVENOUS | Status: DC | PRN
  Administered 2013-05-05: 2.5 mg via INTRAVENOUS

## 2013-05-05 MED ORDER — FENTANYL CITRATE 0.05 MG/ML IJ SOLN
INTRAMUSCULAR | Status: DC | PRN
  Administered 2013-05-05: 250 ug via INTRAVENOUS
  Administered 2013-05-05: 50 ug via INTRAVENOUS
  Administered 2013-05-05 (×3): 250 ug via INTRAVENOUS
  Administered 2013-05-05: 950 ug via INTRAVENOUS

## 2013-05-05 MED ORDER — BISACODYL 5 MG PO TBEC
10.0000 mg | DELAYED_RELEASE_TABLET | Freq: Every day | ORAL | Status: DC
  Administered 2013-05-06 – 2013-05-07 (×2): 10 mg via ORAL
  Filled 2013-05-05 (×2): qty 2

## 2013-05-05 MED ORDER — OXYCODONE HCL 5 MG PO TABS
5.0000 mg | ORAL_TABLET | ORAL | Status: DC | PRN
  Administered 2013-05-06: 5 mg via ORAL
  Administered 2013-05-07: 10 mg via ORAL
  Administered 2013-05-07: 5 mg via ORAL
  Filled 2013-05-05: qty 1
  Filled 2013-05-05 (×2): qty 2

## 2013-05-05 MED ORDER — ACETAMINOPHEN 500 MG PO TABS
1000.0000 mg | ORAL_TABLET | Freq: Four times a day (QID) | ORAL | Status: DC
  Administered 2013-05-06 – 2013-05-07 (×5): 1000 mg via ORAL
  Filled 2013-05-05 (×9): qty 2

## 2013-05-05 MED ORDER — SODIUM CHLORIDE 0.9 % IV SOLN
INTRAVENOUS | Status: DC
  Filled 2013-05-05: qty 1

## 2013-05-05 MED ORDER — BISACODYL 10 MG RE SUPP: 10.0000 mg | Freq: Every day | RECTAL | Status: DC

## 2013-05-05 MED ORDER — SODIUM CHLORIDE 0.45 % IV SOLN
INTRAVENOUS | Status: DC
  Administered 2013-05-05: 13:00:00 via INTRAVENOUS

## 2013-05-05 MED ORDER — ACETAMINOPHEN 650 MG RE SUPP
650.0000 mg | Freq: Once | RECTAL | Status: AC
  Administered 2013-05-05: 650 mg via RECTAL

## 2013-05-05 MED ORDER — FAMOTIDINE IN NACL 20-0.9 MG/50ML-% IV SOLN
20.0000 mg | Freq: Two times a day (BID) | INTRAVENOUS | Status: AC
  Administered 2013-05-05 (×2): 20 mg via INTRAVENOUS
  Filled 2013-05-05: qty 50

## 2013-05-05 MED ORDER — METOCLOPRAMIDE HCL 5 MG/ML IJ SOLN
10.0000 mg | Freq: Four times a day (QID) | INTRAMUSCULAR | Status: AC
  Administered 2013-05-05 – 2013-05-06 (×3): 10 mg via INTRAVENOUS
  Filled 2013-05-05 (×4): qty 2

## 2013-05-05 MED ORDER — METOPROLOL TARTRATE 25 MG/10 ML ORAL SUSPENSION
12.5000 mg | Freq: Two times a day (BID) | ORAL | Status: DC
  Filled 2013-05-05 (×5): qty 5

## 2013-05-05 MED ORDER — SODIUM CHLORIDE 0.9 % IV SOLN
INTRAVENOUS | Status: DC
Start: 2013-05-05 — End: 2013-05-07

## 2013-05-05 MED ORDER — ACETAMINOPHEN 160 MG/5ML PO SOLN: 650.0000 mg | Freq: Once | ORAL | Status: AC

## 2013-05-05 MED ORDER — NITROGLYCERIN IN D5W 200-5 MCG/ML-% IV SOLN: 0.0000 ug/min | INTRAVENOUS | Status: DC

## 2013-05-05 MED ORDER — DEXMEDETOMIDINE HCL IN NACL 200 MCG/50ML IV SOLN
0.1000 ug/kg/h | INTRAVENOUS | Status: DC
  Administered 2013-05-05: 0.4 ug/kg/h via INTRAVENOUS
  Filled 2013-05-05: qty 50

## 2013-05-05 MED ORDER — PROPOFOL 10 MG/ML IV BOLUS
INTRAVENOUS | Status: DC | PRN
  Administered 2013-05-05: 20 mg via INTRAVENOUS

## 2013-05-05 MED ORDER — PANTOPRAZOLE SODIUM 40 MG PO TBEC
40.0000 mg | DELAYED_RELEASE_TABLET | Freq: Every day | ORAL | Status: DC
  Administered 2013-05-07: 40 mg via ORAL
  Filled 2013-05-05: qty 1

## 2013-05-05 MED ORDER — ARTIFICIAL TEARS OP OINT
TOPICAL_OINTMENT | OPHTHALMIC | Status: DC | PRN
  Administered 2013-05-05: 1 via OPHTHALMIC

## 2013-05-05 MED ORDER — LACTATED RINGERS IV SOLN
INTRAVENOUS | Status: DC | PRN
  Administered 2013-05-05: 06:00:00 via INTRAVENOUS

## 2013-05-05 MED ORDER — DOCUSATE SODIUM 100 MG PO CAPS
200.0000 mg | ORAL_CAPSULE | Freq: Every day | ORAL | Status: DC
  Administered 2013-05-06 – 2013-05-07 (×2): 200 mg via ORAL
  Filled 2013-05-05 (×2): qty 2

## 2013-05-05 MED ORDER — ASPIRIN 81 MG PO CHEW: 324.0000 mg | CHEWABLE_TABLET | Freq: Every day | ORAL | Status: DC

## 2013-05-05 MED ORDER — ALBUMIN HUMAN 5 % IV SOLN
250.0000 mL | INTRAVENOUS | Status: AC | PRN
  Administered 2013-05-05 (×2): 250 mL via INTRAVENOUS

## 2013-05-05 MED ORDER — 0.9 % SODIUM CHLORIDE (POUR BTL) OPTIME
TOPICAL | Status: DC | PRN
  Administered 2013-05-05: 6000 mL

## 2013-05-05 MED ORDER — AMIODARONE HCL IN DEXTROSE 360-4.14 MG/200ML-% IV SOLN: 60.0000 mg/h | INTRAVENOUS | Status: AC

## 2013-05-05 MED ORDER — HEMOSTATIC AGENTS (NO CHARGE) OPTIME
TOPICAL | Status: DC | PRN
  Administered 2013-05-05: 1 via TOPICAL

## 2013-05-05 MED ORDER — DOPAMINE-DEXTROSE 3.2-5 MG/ML-% IV SOLN
INTRAVENOUS | Status: DC | PRN
  Administered 2013-05-05: 3 ug/kg/min via INTRAVENOUS

## 2013-05-05 MED ORDER — POTASSIUM CHLORIDE 10 MEQ/50ML IV SOLN
10.0000 meq | Freq: Once | INTRAVENOUS | Status: AC
  Administered 2013-05-05: 10 meq via INTRAVENOUS

## 2013-05-05 MED ORDER — MORPHINE SULFATE 2 MG/ML IJ SOLN: 2.0000 mg | INTRAMUSCULAR | Status: DC | PRN

## 2013-05-05 MED FILL — Lidocaine HCl IV Inj 20 MG/ML: INTRAVENOUS | Qty: 5 | Status: AC

## 2013-05-05 MED FILL — Mannitol IV Soln 20%: INTRAVENOUS | Qty: 500 | Status: AC

## 2013-05-05 MED FILL — Sodium Chloride IV Soln 0.9%: INTRAVENOUS | Qty: 1000 | Status: AC

## 2013-05-05 MED FILL — Electrolyte-R (PH 7.4) Solution: INTRAVENOUS | Qty: 5000 | Status: AC

## 2013-05-05 MED FILL — Sodium Chloride Irrigation Soln 0.9%: Qty: 3000 | Status: AC

## 2013-05-05 MED FILL — Heparin Sodium (Porcine) Inj 1000 Unit/ML: INTRAMUSCULAR | Qty: 10 | Status: AC

## 2013-05-05 MED FILL — Sodium Bicarbonate IV Soln 8.4%: INTRAVENOUS | Qty: 50 | Status: AC

## 2013-05-05 SURGICAL SUPPLY — 97 items
ATTRACTOMAT 16X20 MAGNETIC DRP (DRAPES) ×2 IMPLANT
BAG DECANTER FOR FLEXI CONT (MISCELLANEOUS) ×2 IMPLANT
BANDAGE ELASTIC 4 VELCRO ST LF (GAUZE/BANDAGES/DRESSINGS) ×2 IMPLANT
BANDAGE ELASTIC 6 VELCRO ST LF (GAUZE/BANDAGES/DRESSINGS) ×2 IMPLANT
BANDAGE GAUZE ELAST BULKY 4 IN (GAUZE/BANDAGES/DRESSINGS) ×2 IMPLANT
BASKET HEART (ORDER IN 25'S) (MISCELLANEOUS) ×1
BASKET HEART (ORDER IN 25S) (MISCELLANEOUS) ×1 IMPLANT
BLADE STERNUM SYSTEM 6 (BLADE) ×2 IMPLANT
BLADE SURG 11 STRL SS (BLADE) ×2 IMPLANT
CABLE PACING FASLOC BLUE (MISCELLANEOUS) ×2 IMPLANT
CANISTER SUCTION 2500CC (MISCELLANEOUS) ×2 IMPLANT
CANNULA EZ GLIDE AORTIC 21FR (CANNULA) ×2 IMPLANT
CANNULA VENOUS LOW PROF 34X46 (CANNULA) IMPLANT
CATH CPB KIT HENDRICKSON (MISCELLANEOUS) ×2 IMPLANT
CATH ROBINSON RED A/P 18FR (CATHETERS) ×2 IMPLANT
CATH THORACIC 36FR (CATHETERS) ×2 IMPLANT
CATH THORACIC 36FR RT ANG (CATHETERS) ×4 IMPLANT
CLIP FOGARTY SPRING 6M (CLIP) ×4 IMPLANT
CLIP TI MEDIUM 24 (CLIP) IMPLANT
CLIP TI WIDE RED SMALL 24 (CLIP) ×8 IMPLANT
COVER SURGICAL LIGHT HANDLE (MISCELLANEOUS) ×2 IMPLANT
CRADLE DONUT ADULT HEAD (MISCELLANEOUS) ×2 IMPLANT
DERMABOND ADVANCED (GAUZE/BANDAGES/DRESSINGS) ×1
DERMABOND ADVANCED .7 DNX12 (GAUZE/BANDAGES/DRESSINGS) ×1 IMPLANT
DRAPE CARDIOVASCULAR INCISE (DRAPES) ×1
DRAPE SLUSH/WARMER DISC (DRAPES) ×2 IMPLANT
DRAPE SRG 135X102X78XABS (DRAPES) ×1 IMPLANT
DRSG COVADERM 4X14 (GAUZE/BANDAGES/DRESSINGS) ×2 IMPLANT
ELECT REM PT RETURN 9FT ADLT (ELECTROSURGICAL) ×4
ELECTRODE REM PT RTRN 9FT ADLT (ELECTROSURGICAL) ×2 IMPLANT
GLOVE BIO SURGEON STRL SZ 6 (GLOVE) ×4 IMPLANT
GLOVE BIO SURGEON STRL SZ 6.5 (GLOVE) ×8 IMPLANT
GLOVE BIOGEL PI IND STRL 6 (GLOVE) ×2 IMPLANT
GLOVE BIOGEL PI IND STRL 6.5 (GLOVE) ×6 IMPLANT
GLOVE BIOGEL PI IND STRL 8 (GLOVE) ×1 IMPLANT
GLOVE BIOGEL PI INDICATOR 6 (GLOVE) ×2
GLOVE BIOGEL PI INDICATOR 6.5 (GLOVE) ×6
GLOVE BIOGEL PI INDICATOR 8 (GLOVE) ×1
GLOVE EUDERMIC 7 POWDERFREE (GLOVE) ×6 IMPLANT
GOWN PREVENTION PLUS XLARGE (GOWN DISPOSABLE) ×6 IMPLANT
GOWN STRL NON-REIN LRG LVL3 (GOWN DISPOSABLE) ×14 IMPLANT
HEMOSTAT POWDER SURGIFOAM 1G (HEMOSTASIS) ×6 IMPLANT
HEMOSTAT SURGICEL 2X14 (HEMOSTASIS) ×2 IMPLANT
INSERT FOGARTY XLG (MISCELLANEOUS) IMPLANT
KIT BASIN OR (CUSTOM PROCEDURE TRAY) ×2 IMPLANT
KIT ROOM TURNOVER OR (KITS) ×2 IMPLANT
KIT SUCTION CATH 14FR (SUCTIONS) ×4 IMPLANT
KIT VASOVIEW W/TROCAR VH 2000 (KITS) ×2 IMPLANT
MARKER GRAFT CORONARY BYPASS (MISCELLANEOUS) ×6 IMPLANT
MARKER SKIN DUAL TIP RULER LAB (MISCELLANEOUS) ×2 IMPLANT
NS IRRIG 1000ML POUR BTL (IV SOLUTION) ×12 IMPLANT
PACK OPEN HEART (CUSTOM PROCEDURE TRAY) ×2 IMPLANT
PAD ARMBOARD 7.5X6 YLW CONV (MISCELLANEOUS) ×4 IMPLANT
PAD ELECT DEFIB RADIOL ZOLL (MISCELLANEOUS) ×2 IMPLANT
PENCIL BUTTON HOLSTER BLD 10FT (ELECTRODE) ×2 IMPLANT
PUNCH AORTIC ROTATE 4.0MM (MISCELLANEOUS) IMPLANT
PUNCH AORTIC ROTATE 4.5MM 8IN (MISCELLANEOUS) ×2 IMPLANT
PUNCH AORTIC ROTATE 5MM 8IN (MISCELLANEOUS) IMPLANT
SET CARDIOPLEGIA MPS 5001102 (MISCELLANEOUS) ×2 IMPLANT
SOLUTION ANTI FOG 6CC (MISCELLANEOUS) ×2 IMPLANT
SPONGE GAUZE 4X4 12PLY (GAUZE/BANDAGES/DRESSINGS) ×4 IMPLANT
SPONGE LAP 4X18 X RAY DECT (DISPOSABLE) ×2 IMPLANT
SUT BONE WAX W31G (SUTURE) ×2 IMPLANT
SUT MNCRL AB 4-0 PS2 18 (SUTURE) ×4 IMPLANT
SUT PROLENE 3 0 SH DA (SUTURE) ×2 IMPLANT
SUT PROLENE 4 0 RB 1 (SUTURE) ×1
SUT PROLENE 4 0 SH DA (SUTURE) IMPLANT
SUT PROLENE 4-0 RB1 .5 CRCL 36 (SUTURE) ×1 IMPLANT
SUT PROLENE 6 0 C 1 30 (SUTURE) ×8 IMPLANT
SUT PROLENE 7 0 BV 1 (SUTURE) ×6 IMPLANT
SUT PROLENE 7 0 BV1 MDA (SUTURE) ×4 IMPLANT
SUT PROLENE 8 0 BV175 6 (SUTURE) IMPLANT
SUT SILK  1 MH (SUTURE) ×1
SUT SILK 1 MH (SUTURE) ×1 IMPLANT
SUT STEEL 6MS V (SUTURE) ×2 IMPLANT
SUT STEEL STERNAL CCS#1 18IN (SUTURE) IMPLANT
SUT STEEL SZ 6 DBL 3X14 BALL (SUTURE) ×2 IMPLANT
SUT VIC AB 1 CTX 36 (SUTURE) ×2
SUT VIC AB 1 CTX36XBRD ANBCTR (SUTURE) ×2 IMPLANT
SUT VIC AB 2-0 CT1 27 (SUTURE) ×1
SUT VIC AB 2-0 CT1 TAPERPNT 27 (SUTURE) ×1 IMPLANT
SUT VIC AB 2-0 CTX 27 (SUTURE) IMPLANT
SUT VIC AB 3-0 SH 27 (SUTURE)
SUT VIC AB 3-0 SH 27X BRD (SUTURE) IMPLANT
SUT VIC AB 3-0 X1 27 (SUTURE) IMPLANT
SUT VICRYL 4-0 PS2 18IN ABS (SUTURE) IMPLANT
SUTURE E-PAK OPEN HEART (SUTURE) ×2 IMPLANT
SYSTEM SAHARA CHEST DRAIN ATS (WOUND CARE) ×2 IMPLANT
TAPE CLOTH SURG 4X10 WHT LF (GAUZE/BANDAGES/DRESSINGS) ×4 IMPLANT
TAPE PAPER 3X10 WHT MICROPORE (GAUZE/BANDAGES/DRESSINGS) ×2 IMPLANT
TOWEL OR 17X24 6PK STRL BLUE (TOWEL DISPOSABLE) ×4 IMPLANT
TOWEL OR 17X26 10 PK STRL BLUE (TOWEL DISPOSABLE) ×4 IMPLANT
TRAY FOLEY IC TEMP SENS 14FR (CATHETERS) ×2 IMPLANT
TUBE FEEDING 8FR 16IN STR KANG (MISCELLANEOUS) ×2 IMPLANT
TUBING INSUFFLATION 10FT LAP (TUBING) ×2 IMPLANT
UNDERPAD 30X30 INCONTINENT (UNDERPADS AND DIAPERS) ×2 IMPLANT
WATER STERILE IRR 1000ML POUR (IV SOLUTION) ×4 IMPLANT

## 2013-05-05 NOTE — Progress Notes (Signed)
Pt returned to full support. Unable to follow commands/parameters. Dr. Cyndia Bent at bedside and witnessed pt effort. RT will attempt wean again at later time.

## 2013-05-05 NOTE — Consult Note (Signed)
WOC wound consult note Reason for Consult: Consult requested for right foot wound.  Initial WOC consult performed on 11/2; refer to previous progress notes for assessment and plan of care.   Wound type:Healing full thickness wound  Measurement:.Previously one site; now healing bridge of scar tissue has formed between two areas. 2X.2X.1cm and .3X.3X.1cm on anterior foot. Wound bed:100% pink and moist. Drainage (amount, consistency, odor) Loose nonviable skin removes easily from wound edges. Periwound: pink scar tissue surrounding edges. Dressing procedure/placement/frequency:Foam dressing to protect and promote healing. Please re-consult if further assistance is needed.  Thank-you,  Julien Girt MSN, [place], [place], [place], [place]

## 2013-05-05 NOTE — Progress Notes (Signed)
Attending Addendum  I examined the patient and discussed the assessment and plan with Dr. Lonny Prude. I have reviewed the note and agree.  Patient seen post op in 2S01 (ICU) - intubated and hemodynamically stable. Post op CABG x 5. He has some post op hypothermia on a bear hugger. He has some post op SVT now on dopamine gtt.  He will remain in the cardiac ICU overnight into the weekend.   His family is waiting in the waiting room to see him. I have updated them regarding his condition. They have not yet spoken to Dr. Roxan Hockey.   Family Medicine Will continue to follow along while Dr. Roxan Hockey serves as primary attending.   Noted recommendation: Delay start of Pharmacological VTE agent (>24hrs) due to surgical blood loss or risk of bleeding: yes     [name] Nearing, MD [place]

## 2013-05-05 NOTE — Progress Notes (Signed)
Patient ID: [name] [name], male   DOB: March 24, 1951, 62 y.o.   MRN: [id number]   SICU Evening Rounds:   Hemodynamically stable  CI = 2.44  Has started to wake up on vent and about ready for extubation.  Urine output good  CT output low  CBC    Component Value Date/Time   WBC 11.9* 05/05/2013 1845   RBC 3.71* 05/05/2013 1845   HGB 10.9* 05/05/2013 1853   HCT 32.0* 05/05/2013 1853   PLT 138* 05/05/2013 1845   MCV 77.9* 05/05/2013 1845   MCH 24.8* 05/05/2013 1845   MCHC 31.8 05/05/2013 1845   RDW 21.9* 05/05/2013 1845   LYMPHSABS 2.8 05/02/2013 1345   MONOABS 1.6* 05/02/2013 1345   EOSABS 0.4 05/02/2013 1345   BASOSABS 0.0 05/02/2013 1345     BMET    Component Value Date/Time   NA 139 05/05/2013 1853   K 4.8 05/05/2013 1853   CL 105 05/05/2013 1853   CO2 26 05/05/2013 0402   GLUCOSE 130* 05/05/2013 1853   BUN 15 05/05/2013 1853   CREATININE 1.00 05/05/2013 1853   CALCIUM 8.8 05/05/2013 0402   GFRNONAA 76* 05/05/2013 0402   GFRAA 89* 05/05/2013 0402     A/P:  Stable postop course. Continue current plans

## 2013-05-05 NOTE — H&P (View-Only) (Signed)
1 Day Post-Op Procedure(s) (LRB): Extraction of tooth #s 3,4,5,6,8,9,27 with alveoloplast and maxillary left osseous tuberosity reduction (N/A) Subjective: No complaints. Some bleeding from extractions earlier but improved now No CP or SOB  Objective: Vital signs in last 24 hours: Temp:  [98.6 F (37 C)-99.2 F (37.3 C)] 98.6 F (37 C) (11/13 0908) Pulse Rate:  [79-87] 80 (11/13 1208) Cardiac Rhythm:  [-] Normal sinus rhythm (11/13 0759) Resp:  [18-20] 20 (11/13 0908) BP: (100-120)/(60-81) 108/70 mmHg (11/13 1209) SpO2:  [94 %-100 %] 94 % (11/13 0908) Weight:  [187 lb 6.4 oz (85.004 kg)] 187 lb 6.4 oz (85.004 kg) (11/13 0526)  Hemodynamic parameters for last 24 hours:    Intake/Output from previous day: 11/12 0701 - 11/13 0700 In: 480 [P.O.:480] Out: 3625 [Urine:3625] Intake/Output this shift: Total I/O In: 720 [P.O.:720] Out: 500 [Urine:500]  General appearance: alert and no distress Neurologic: intact Heart: regular rate and rhythm Lungs: diminished breath sounds bibasilar Extremities: no edema  Lab Results:  Recent Labs  05/02/13 1345 05/04/13 1040  WBC 10.1 8.3  HGB 11.9* 11.8*  HCT 36.3* 37.1*  PLT 172 203   BMET:  Recent Labs  05/03/13 0520 05/04/13 0529  NA 137 136  K 4.9 4.0  CL 101 99  CO2 24 26  GLUCOSE 74 91  BUN 21 19  CREATININE 1.03 0.99  CALCIUM 9.1 9.6    PT/INR:  Recent Labs  05/04/13 0529  LABPROT 14.3  INR 1.13   ABG    Component Value Date/Time   PHART 7.424 04/28/2013 0805   HCO3 31.9* 04/28/2013 0813   TCO2 33 04/28/2013 0813   O2SAT 68.0 04/28/2013 0813   CBG (last 3)   Recent Labs  05/03/13 1001  GLUCAP 101*   VASCULAR LABS Summary: Bilateral: mild intimal wall thickening CCA. Mild soft plaque origin ICA. 1-39% ICA stenosis. Vertebral artery flow is antegrade. ICA/CCA ratio: R-1.0 L-0.97  The right ABI is 0.57, the left is 0.68, suggestive of moderate arterial insufficiency bilaterally.  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  Brachial pressures:  +--------+-----+----+---+  RightLeftMax +--------+-----+----+---+ Systolic114 [id number] +--------+-----+----+---+  ECHOCARDIOGRAM Study Conclusions  - Left ventricle: Diffuse hypokinesis inferobasal akinesis The cavity size was severely dilated. Wall thickness was normal. Systolic function was severely reduced. The estimated ejection fraction was in the range of 20% to 25%. - Mitral valve: Mild regurgitation. - Left atrium: The atrium was moderately dilated. - Right ventricle: The cavity size was mildly dilated. - Right atrium: The atrium was mildly dilated. - Atrial septum: No defect or patent foramen ovale was identified. - Tricuspid valve: Mild-moderate regurgitation. - Pulmonary arteries: PA peak pressure: 46mm Hg (S).   Assessment/Plan: S/P Procedure(s) (LRB): Extraction of tooth #s [phone] with alveoloplast and maxillary left osseous tuberosity reduction (N/A) For CABG in AM, will plan intraoperative TEE to assess MV and TV Should be fine to use LIMA, may have right SCA stenosis by pressures Will plan to harvest vein from both thighs and stay above knees if possible He's in about as good shape medically as we are likely to achieve prior to revascularization All questions answered   LOS: 12 days    Nitika Jackowski C 05/04/2013

## 2013-05-05 NOTE — Brief Op Note (Addendum)
04/22/2013 - 05/05/2013  11:17 AM  PATIENT:  [name] [name]  62 y.o. 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  PROCEDURE:  Procedure(s):  CORONARY ARTERY BYPASS GRAFTING x 5 -LIMA to LAD -SVG to DIAGONAL -SEQUENTIAL SVG to RAMUS INTERMEDIATE and OM 2 -SVG to PDA  ENDOSCOPIC SAPHENOUS VEIN HARVEST RIGHT AND LEFT THIGH  SURGEON:  Surgeon(s) and Role:    * Melrose Nakayama, MD - Primary  PHYSICIAN ASSISTANT: Ellwood Handler PA-C  ANESTHESIA:   general  EBL:  Total I/O In: -  Out: 1000 [Urine:1000]  BLOOD ADMINISTERED: CELLSAVER  DRAINS: Left pleural chest tube, Mediastinal chest drain   LOCAL MEDICATIONS USED:  NONE  SPECIMEN:  No Specimen  DISPOSITION OF SPECIMEN:  N/A  COUNTS:  YES  XC= 87 min CPB= 128 min  TEE- EF ~ 20%, improved post bypass on dopamine  Mild MR and TR pre and post PLAN OF CARE: Admit to inpatient   PATIENT DISPOSITION:  ICU - intubated and hemodynamically stable.   Delay start of Pharmacological VTE agent (>24hrs) due to surgical blood loss or risk of bleeding: yes

## 2013-05-05 NOTE — Progress Notes (Signed)
  Echocardiogram Echocardiogram Transesophageal (OR) has been performed.  [name] [name] 05/05/2013, 8:48 AM

## 2013-05-05 NOTE — Anesthesia Preprocedure Evaluation (Addendum)
Anesthesia Evaluation  Patient identified by MRN, date of birth, ID band Patient awake    Reviewed: Allergy & Precautions, H&P , NPO status , Patient's Chart, lab work & pertinent test results, reviewed documented beta blocker date and time   History of Anesthesia Complications Negative for: history of anesthetic complications  Airway Mallampati: II TM Distance: >3 FB Neck ROM: Full    Dental  (+) Edentulous Upper and Edentulous Lower   Pulmonary COPD COPD inhaler, former smoker,  Patient unclear about pneumonia but says was hospitalized recently. breath sounds clear to auscultation  Pulmonary exam normal       Cardiovascular Exercise Tolerance: Poor hypertension, Pt. on medications and Pt. on home beta blockers + CAD (3v ASCADz) and +CHF Rhythm:Regular Rate:Normal  Ej Fx 20-25%, mod TR   Neuro/Psych negative neurological ROS     GI/Hepatic negative GI ROS, Neg liver ROS,   Endo/Other  diabetes, Type 2, Oral Hypoglycemic Agents  Renal/GU negative Renal ROS     Musculoskeletal   Abdominal   Peds  Hematology negative hematology ROS (+)   Anesthesia Other Findings   Reproductive/Obstetrics                        Anesthesia Physical Anesthesia Plan  ASA: III  Anesthesia Plan: General   Post-op Pain Management:    Induction: Intravenous  Airway Management Planned: Oral ETT  Additional Equipment: Arterial line, CVP, PA Cath and TEE  Intra-op Plan:   Post-operative Plan: Post-operative intubation/ventilation  Informed Consent: I have reviewed the patients History and Physical, chart, labs and discussed the procedure including the risks, benefits and alternatives for the proposed anesthesia with the patient or authorized representative who has indicated his/her understanding and acceptance.     Plan Discussed with: CRNA and Surgeon  Anesthesia Plan Comments: (Plan routine monitors, A  line, PA cath, GETA)      Anesthesia Quick Evaluation

## 2013-05-05 NOTE — OR Nursing (Signed)
1205 first call to SICU, 1233 second call to SICU

## 2013-05-05 NOTE — Progress Notes (Signed)
Patient resting in room throughout shift on no acute distress. VSS. Pre-op orders ordered and completed per protocol.  Family at bedside.  RN will continue to monitor. Carolan Shiver

## 2013-05-05 NOTE — Progress Notes (Signed)
Family Medicine Teaching Service Daily Progress Note Intern Pager: [phone]  Patient name: [name] [name] Medical record number: [id number] Date of birth: 1951/02/19 Age: 62 y.o. Gender: male  Primary Care Provider: Fae Pippin Consultants: Cardiology, Cardiothoracic Surgery, PT, Dental Code Status: Full  Pt Overview and Major Events to Date:  04/23/13: Increased LFT's from previous day 04/26/13: Hepatic u/s shows no hepatic/portal vein clots. 04/28/13: cardiac cath shows triple vessel disease 05/02/13: spironolactone added to regimen 05/03/13: Dental extraction  Assessment and Plan: [name] is a 62 y.o. male presenting with abdominal discomfort and distention, found to have AKI and markedly elevated LFT's . PMH is significant for CHF, COPD, HTN, and tobacco abuse. Currently improving.  #CAD: 3 vessel disease found on cath with severe occlusion of RCA. CABG this Thursday (11/13) or Friday (11/14) - CT surgery is following and will determine patient's candidacy for CABG - follow-up CT surgery recommendations; will consult dental today  # CHF: EF: 20-25% which has worsened from previous 45-50% in 2012. PA pressure: 91mmHg. Weight on admission: 215lbs. Currently diuresing well. 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 - UOP in last 24 hours: 1.23L (~0.6 ml/kg/hr)  - Weight today is 187lbs (down 28 lbs from admit) - cardiology recs:  - continue carvedilol: continue 6.25 BID (11/9)  - continue lisinopril 5mg   - continue digoxin  - continue lasix 60mg  PO BID  - continue spironolactone (watch potassium: 4.9) - follow-up cardiology recommendations  - Cardiac Rehab following  # CAD - cardiology recommends low dose statin being mindful of LFTs: started simvastatin 20mg   # Transaminitis with elevated bilirubin, mild ascites: repeat LFT's trending down with AST/ALT: 25/57 (11/14) and tbili of 1.2. Appears to be more related to RHF. INR stable and  currently 1.08. He is improving overall - hold home colchicine, amitiza and allopurinol - follow-up daily INR and hepatic function - PT recommends home health PT (ordered)  # Hx of SOB: Resolved. Most likely due to CHF. - continue albuterol, spiriva - continue to monitor on telemetry   # Right foot wound: being managed by wound care as an outpatient.  - wound care in hospital  # Right leg swelling and erythema: appears to be resolved  # Diabetes: pt reports he has DM, not on any medications per med list. CBG's in 70's-100s. A1C is 5.7  # Gout: hold colchicine, hold allopurinol  # Tobacco abuse: will provide nicotine patch per pt request   FEN/GI: clear liquid diet Prophylaxis: SCDs  Disposition: CABG today  Subjective: Patient in OR for CABG.  Objective: Temp:  [98.2 F (36.8 C)-98.9 F (37.2 C)] 98.9 F (37.2 C) (11/13 2038) Pulse Rate:  [79-92] 91 (11/13 2038) Resp:  [18-20] 18 (11/13 2038) BP: (98-120)/(60-81) 98/60 mmHg (11/13 2038) SpO2:  [94 %-100 %] 98 % (11/13 2038) Weight:  [187 lb 6.4 oz (85.004 kg)] 187 lb 6.4 oz (85.004 kg) (11/13 0526)  Physical Exam: Was not able to exam patient today.  Laboratory:  Recent Labs Lab 05/01/13 0356 05/02/13 1345 05/04/13 1040  WBC 12.7* 10.1 8.3  HGB 12.2* 11.9* 11.8*  HCT 37.7* 36.3* 37.1*  PLT 188 172 203    Recent Labs Lab 05/02/13 0545 05/03/13 0520 05/04/13 0529  NA 136 137 136  K 4.1 4.9 4.0  CL 101 101 99  CO2 25 24 26   BUN 21 21 19   CREATININE 1.09 1.03 0.99  CALCIUM 9.0 9.1 9.6  PROT 7.0 7.4 8.3  BILITOT 1.7*  1.5* 2.0*  ALKPHOS 81 71 78  ALT 132* 103* 83*  AST 40* 68* 32  GLUCOSE 89 74 91    Recent Labs Lab 04/30/13 0411 05/01/13 0356 05/02/13 0545 05/03/13 0520 05/04/13 0529  INR 1.27 1.24 0.99 1.01 1.13    Imaging/Diagnostic Tests:  No recent imaging   Medications: Scheduled Meds: . aminocaproic acid (AMICAR) for OHS   Intravenous To OR  . aspirin EC  81 mg Oral Daily   . carvedilol  6.25 mg Oral BID WC  . cefUROXime (ZINACEF)  IV  1.5 g Intravenous To OR  . cefUROXime (ZINACEF)  IV  750 mg Intravenous To OR  . chlorhexidine   Topical QHS,MR X 1  . dexmedetomidine  0.1-0.7 mcg/kg/hr Intravenous To OR  . digoxin  0.125 mg Oral Daily  . DOPamine  2-20 mcg/kg/min Intravenous To OR  . epinephrine  0.5-20 mcg/min Intravenous To OR  . feeding supplement (ENSURE COMPLETE)  237 mL Oral BID BM  . furosemide  60 mg Oral BID  . heparin-papaverine-plasmalyte irrigation   Irrigation To OR  . heparin 30,000 units/NS 1000 mL solution for CELLSAVER   Other To OR  . insulin (NOVOLIN-R) infusion   Intravenous To OR  . lisinopril  5 mg Oral Daily  . magnesium sulfate  40 mEq Other To OR  . nicotine  7 mg Transdermal Daily  . nitroGLYCERIN  2-200 mcg/min Intravenous To OR  . pantoprazole  40 mg Oral Daily  . phenylephrine (NEO-SYNEPHRINE) Adult infusion  30-200 mcg/min Intravenous To OR  . polyethylene glycol  17 g Oral Daily  . potassium chloride  80 mEq Other To OR  . simvastatin  20 mg Oral q1800  . sodium chloride  3 mL Intravenous Q12H  . spironolactone  12.5 mg Oral Daily  . tiotropium  18 mcg Inhalation Daily  . vancomycin  1,500 mg Intravenous To OR   Continuous Infusions: . lactated ringers Stopped (05/03/13 1300)   PRN Meds:.acetaminophen, albuterol, HYDROcodone-acetaminophen, ondansetron (ZOFRAN) IV, ondansetron, oxyCODONE-acetaminophen   Cordelia Poche, MD 05/05/2013, 2:42 AM PGY-1, [place] Intern pager: [phone], text pages welcome

## 2013-05-05 NOTE — Transfer of Care (Signed)
Immediate Anesthesia Transfer of Care Note  Patient: [name] [name]  Procedure(s) Performed: Procedure(s): CORONARY ARTERY BYPASS GRAFTING (CABG) TIMES FIVE USING LEFT INTERNAL MAMMARY ARTERY AND RIGHT AND LEFT SAPHENOUS LEG VEIN HARVESTED ENDOSCOPICALLY (N/A)  Patient Location: SICU  Anesthesia Type:General  Level of Consciousness: Patient remains intubated per anesthesia plan  Airway & Oxygen Therapy: Patient placed on Ventilator (see vital sign flow sheet for setting)  Post-op Assessment: Report given to PACU RN  Post vital signs: Reviewed and stable  Complications: No apparent anesthesia complications

## 2013-05-05 NOTE — Progress Notes (Signed)
Utilization Review Completed.  

## 2013-05-05 NOTE — Interval H&P Note (Signed)
History and Physical Interval Note:  05/05/2013 7:19 AM  [name] [name]  has presented today for surgery, with the diagnosis of CAD  The various methods of treatment have been discussed with the patient and family. After consideration of risks, benefits and other options for treatment, the patient has consented to  Procedure(s): CORONARY ARTERY BYPASS GRAFTING (CABG) (N/A) as a surgical intervention .  The patient's history has been reviewed, patient examined, no change in status, stable for surgery.  I have reviewed the patient's chart and labs.  Questions were answered to the patient's satisfaction.     HENDRICKSON,STEVEN C

## 2013-05-05 NOTE — OR Nursing (Signed)
[id number] chest incision made by Dr. Roxan Hockey

## 2013-05-05 NOTE — Anesthesia Procedure Notes (Signed)
Procedure Name: Intubation Date/Time: 05/05/2013 7:46 AM Performed by: Izora Gala Pre-anesthesia Checklist: Patient identified, Emergency Drugs available, Suction available and Patient being monitored Patient Re-evaluated:Patient Re-evaluated prior to inductionOxygen Delivery Method: Circle system utilized Preoxygenation: Pre-oxygenation with 100% oxygen Intubation Type: IV induction Ventilation: Mask ventilation without difficulty and Two handed mask ventilation required Laryngoscope Size: Miller and 3 Grade View: Grade I Tube type: Oral Tube size: 8.0 mm Number of attempts: 1 Airway Equipment and Method: Stylet Placement Confirmation: ETT inserted through vocal cords under direct vision,  positive ETCO2 and breath sounds checked- equal and bilateral Secured at: 22 cm Tube secured with: Tape Dental Injury: Teeth and Oropharynx as per pre-operative assessment

## 2013-05-06 ENCOUNTER — Inpatient Hospital Stay (HOSPITAL_COMMUNITY): Payer: Medicaid Other

## 2013-05-06 LAB — BASIC METABOLIC PANEL
BUN: 14 mg/dL (ref 6–23)
Creatinine, Ser: 0.95 mg/dL (ref 0.50–1.35)
GFR calc Af Amer: >90 mL/min (ref >90)
GFR calc non Af Amer: 88 mL/min — ABNORMAL LOW (ref >90)
Glucose, Bld: 124 mg/dL — ABNORMAL HIGH (ref 70–99)
Potassium: 4.2 mEq/L (ref 3.5–5.1)

## 2013-05-06 LAB — POCT I-STAT 3, ART BLOOD GAS (G3+)
Bicarbonate: 24.6 mEq/L — ABNORMAL HIGH (ref 20.0–24.0)
TCO2: 26 mmol/L (ref 0–100)
pCO2 arterial: 41.4 mmHg (ref 35.0–45.0)
pCO2 arterial: 41.9 mmHg (ref 35.0–45.0)
pH, Arterial: 7.383 (ref 7.350–7.450)
pH, Arterial: 7.389 (ref 7.350–7.450)
pO2, Arterial: 110 mmHg — ABNORMAL HIGH (ref 80.0–100.0)
pO2, Arterial: 153 mmHg — ABNORMAL HIGH (ref 80.0–100.0)

## 2013-05-06 LAB — GLUCOSE, CAPILLARY
Glucose-Capillary: 102 mg/dL — ABNORMAL HIGH (ref 70–99)
Glucose-Capillary: 105 mg/dL — ABNORMAL HIGH (ref 70–99)
Glucose-Capillary: 107 mg/dL — ABNORMAL HIGH (ref 70–99)
Glucose-Capillary: 108 mg/dL — ABNORMAL HIGH (ref 70–99)
Glucose-Capillary: 108 mg/dL — ABNORMAL HIGH (ref 70–99)
Glucose-Capillary: 109 mg/dL — ABNORMAL HIGH (ref 70–99)
Glucose-Capillary: 112 mg/dL — ABNORMAL HIGH (ref 70–99)
Glucose-Capillary: 112 mg/dL — ABNORMAL HIGH (ref 70–99)
Glucose-Capillary: 114 mg/dL — ABNORMAL HIGH (ref 70–99)
Glucose-Capillary: 163 mg/dL — ABNORMAL HIGH (ref 70–99)
Glucose-Capillary: 85 mg/dL (ref 70–99)

## 2013-05-06 LAB — POCT I-STAT, CHEM 8
BUN: 13 mg/dL (ref 6–23)
Chloride: 105 mEq/L (ref 96–112)
Creatinine, Ser: 1.1 mg/dL (ref 0.50–1.35)
HCT: 30 % — ABNORMAL LOW (ref 39.0–52.0)
Sodium: 134 mEq/L — ABNORMAL LOW (ref 135–145)
TCO2: 26 mmol/L (ref 0–100)

## 2013-05-06 LAB — CBC
HCT: 26.7 % — ABNORMAL LOW (ref 39.0–52.0)
HCT: 26.2 % — ABNORMAL LOW (ref 39.0–52.0)
MCH: 24.6 pg — ABNORMAL LOW (ref 26.0–34.0)
MCHC: 31.3 g/dL (ref 30.0–36.0)
MCV: 78.7 fL (ref 78.0–100.0)
Platelets: 141 10*3/uL — ABNORMAL LOW (ref 150–400)
RBC: 3.41 MIL/uL — ABNORMAL LOW (ref 4.22–5.81)
RDW: 22.1 % — ABNORMAL HIGH (ref 11.5–15.5)
WBC: 16.5 10*3/uL — ABNORMAL HIGH (ref 4.0–10.5)

## 2013-05-06 LAB — CREATININE, SERUM: GFR calc Af Amer: >90 mL/min (ref >90)

## 2013-05-06 MED ORDER — POTASSIUM CHLORIDE CRYS ER 20 MEQ PO TBCR
40.0000 meq | EXTENDED_RELEASE_TABLET | Freq: Once | ORAL | Status: AC
  Administered 2013-05-06: 40 meq via ORAL
  Filled 2013-05-06: qty 2

## 2013-05-06 MED ORDER — ENOXAPARIN SODIUM 40 MG/0.4ML ~~LOC~~ SOLN
40.0000 mg | Freq: Every day | SUBCUTANEOUS | Status: DC
  Administered 2013-05-06 – 2013-05-10 (×5): 40 mg via SUBCUTANEOUS
  Filled 2013-05-06 (×6): qty 0.4

## 2013-05-06 MED ORDER — POTASSIUM CHLORIDE 10 MEQ/50ML IV SOLN
10.0000 meq | INTRAVENOUS | Status: AC | PRN
  Administered 2013-05-06 (×3): 10 meq via INTRAVENOUS
  Filled 2013-05-06 (×2): qty 50

## 2013-05-06 MED ORDER — INSULIN DETEMIR 100 UNIT/ML ~~LOC~~ SOLN
10.0000 [IU] | Freq: Once | SUBCUTANEOUS | Status: AC
  Administered 2013-05-06: 10 [IU] via SUBCUTANEOUS
  Filled 2013-05-06: qty 0.1

## 2013-05-06 MED ORDER — AMIODARONE HCL 200 MG PO TABS
400.0000 mg | ORAL_TABLET | Freq: Two times a day (BID) | ORAL | Status: DC
  Administered 2013-05-06 – 2013-05-11 (×11): 400 mg via ORAL
  Filled 2013-05-06 (×12): qty 2

## 2013-05-06 MED ORDER — FUROSEMIDE 10 MG/ML IJ SOLN
40.0000 mg | Freq: Once | INTRAMUSCULAR | Status: AC
  Administered 2013-05-06: 40 mg via INTRAVENOUS
  Filled 2013-05-06: qty 4

## 2013-05-06 MED ORDER — INSULIN ASPART 100 UNIT/ML ~~LOC~~ SOLN
0.0000 [IU] | SUBCUTANEOUS | Status: DC
  Administered 2013-05-06: 2 [IU] via SUBCUTANEOUS

## 2013-05-06 MED ORDER — BIOTENE DRY MOUTH MT LIQD
15.0000 mL | Freq: Two times a day (BID) | OROMUCOSAL | Status: DC
  Administered 2013-05-06 – 2013-05-11 (×10): 15 mL via OROMUCOSAL

## 2013-05-06 NOTE — Procedures (Signed)
Extubation Procedure Note  Patient Details:   Name: [name] [name] DOB: March 01, 1951 MRN: [id number]   Airway Documentation:   Pt extubated per Rapid Wean Protocol. Pre extubation: NIF -30, VC 1.2, audible cuff leak. ABG in normal range. Post extubation: placed on 2L [place] Sats 98%, pt able to verbalize name/location, no stridor. RT will continue to monitor pt. Evaluation  O2 sats: stable throughout Complications: No apparent complications Patient did tolerate procedure well. Bilateral Breath Sounds: Clear   Yes  Sharen Hint 05/06/2013, 12:22 AM

## 2013-05-06 NOTE — Op Note (Signed)
NAMETIMO, DRUST                  ACCOUNT NO.:  [id number]  MEDICAL RECORD NO.:  [id number]  LOCATION:  2S01C                        FACILITY:  [facility]  PHYSICIAN:  Revonda Standard. Roxan Hockey, M.D.DATE OF BIRTH:  1951-05-16  DATE OF PROCEDURE:  05/05/2013 DATE OF DISCHARGE:                              OPERATIVE REPORT   PREOPERATIVE DIAGNOSIS:  Severe three-vessel coronary disease with ischemic cardiomyopathy.  POSTOPERATIVE DIAGNOSIS:  Severe three-vessel coronary disease with ischemic cardiomyopathy.  PROCEDURE:  Median sternotomy, extracorporeal circulation, coronary artery bypass grafting x5 (left internal mammary artery to left anterior descending, saphenous vein graft to second diagonal, sequential saphenous vein graft to ramus intermedius and obtuse marginal 2, saphenous vein graft to posterior descending), endoscopic vein harvest, both thighs.  SURGEON:  Revonda Standard. Roxan Hockey, M.D.  ASSISTANT:  Ellwood Handler, PA-C.  ANESTHESIA:  General.  FINDINGS:  Posterior descending diffusely diseased, but 1.5 mm probe did pass.  Remaining targets good quality. Good quality conduits. Cardiomegaly.   Transesophageal echocardiography revealed severe left ventricular dysfunction with an ejection fraction of approximately 20%, mild MR and TR prebypass and post bypass. Improved LV wall motion post bypass.  CLINICAL NOTE:  Mr. Pelosi is a 62 year old gentleman who presented with symptoms of congestive heart failure.  He was found to have severe left ventricular dysfunction with an ejection fraction of 20%.  He underwent cardiac catheterization and was found to have severe three-vessel coronary disease.  He was treated medically initially and had improvement in his renal and hepatic dysfunction.  He now was felt suitable as a candidate for bypass grafting.  The indications, risks, benefits, and alternative treatments were discussed in detail with the patient.  He understood and accepted  the risks of bypass surgery and agreed to proceed.  OPERATIVE NOTE:  Mr. Kleban was brought to the preoperative holding area on May 05, 2013.  There Anesthesia placed a Swan-Ganz catheter and an arterial blood pressure monitoring line.  He was taken to the operating room.  Intravenous antibiotics were administered.  He was anesthetized and intubated.  He remained stable on induction.  A Foley catheter was placed.  The chest, abdomen, and legs were prepped and draped in the usual sterile fashion.  Transesophageal echocardiography was performed.  It revealed severe left ventricular dysfunction with global hypokinesis and anterior akinesis.  There was mild mitral and tricuspid regurgitation.  Median sternotomy was performed and the left internal mammary artery was harvested in standard fashion.  This was very difficult due to the mammary being very tightly adherent to the costal cartilages in the lower portion of the chest and the perichondrium had to be taken off with the mammary in 1 area, however, once harvested there was good flow through the mammary artery.  Simultaneously with the mammary artery harvest, vein was harvested from both thighs endoscopically. The vein was of good quality.  Heparin 2000 units was administered during the vessel harvest.  After harvesting the conduits, the remainder of the full heparin dose was given.  The pericardium was opened.  There was a small pericardial effusion. The aorta was of normal caliber with no palpable atherosclerotic disease.  After confirming adequate anticoagulation with ACT measurement,  the aorta was cannulated via concentric 2-0 Ethibond pledgeted pursestring sutures.  A dual-stage venous cannula was placed via pursestring suture in the right atrial appendage.  Cardiopulmonary bypass was instituted and the patient was cooled to 32 degrees Celsius. The coronary arteries were inspected and anastomotic sites were chosen. The  conduits were inspected and cut to length.  A foam pad was placed in the pericardium to insulate the heart and protect the left phrenic nerve.  A temperature probe was placed in the myocardial septum and a cardioplegia cannula placed in the ascending aorta.  The aorta was crossclamped.  The left ventricle was emptied via the aortic root vent.  Cardiac arrest then was achieved with a combination of cold antegrade blood cardioplegia and topical iced saline.  1 L of cardioplegia was administered.  There was myocardial septal cooling to 10 degrees Celsius and rapid diastolic arrest.  The following distal anastomoses were performed.  First, a reversed saphenous vein graft was placed end-to-side to the posterior descending branch of the right coronary.  This was a 1.5 mm vessel.  It was diffusely diseased, but a probe did pass distally.  The vein was of good quality.  It was anastomosed end-to-side with a running 7-0 Prolene suture.  At completion of each graft, a probe was passed proximally and distally to ensure patency prior to tying the suture. Cardioplegia was administered at the completion of each vein graft to assess flow and hemostasis, both were good.  Next, a reversed saphenous vein graft was placed end-to-side to the second diagonal branch to the LAD.  This was a bifurcating vessel.  The more medial branch was heavily calcified and not graftable.  The more lateral branch was graftable, it did accept a 1.5 mm probe.  The vein was anastomosed end-to-side with a running 7-0 Prolene suture.  Again, there was good flow through this graft as well.  After giving additional cardioplegia down the 2 vein grafts and the aortic root, the heart was elevated.  OM 2 was a good quality target. OM 1 was too small to graft.  There was a high anterolateral ramus equivalent branch that was a 1.5 mm good quality target.  A side-to-side anastomosis was performed to this vessel and an end-to-side to OM  2. Both were done with running 7-0 Prolene sutures.  Both probed proximally and distally easily at the completion of the anastomoses.  At completion of vein graft, cardioplegia was administered.  There was excellent flow through the graft and good hemostasis at both anastomoses.  Next, the left internal mammary artery was brought through a window in the pericardium.  The distal end was beveled.  It was a 1.5 mm vessel with good flow.  It was anastomosed end-to-side to the distal LAD.  The LAD was intramyocardial in its mid portion and was grafted where it emerged back to the epicardial surface of the heart.  It did accept a 1.5 mm probe proximally.  Distally, there was some diffuse disease in the vessel.  The mammary was anastomosed end-to-side with a running 8-0 Prolene suture.  At the completion of the mammary to LAD anastomosis, the bulldog clamp was briefly removed.  There was immediate and rapid septal rewarming.  There was good hemostasis.  The bulldog clamp was replaced. The mammary pedicle was tacked to the epicardial surface of the heart with 6-0 Prolene sutures.  Additional cardioplegia was administered.  The vein grafts were cut to length.  The diagonal vein was not long  enough to reach the aorta.  The proximal anastomoses for the sequential vein and the posterior descending vein were performed to 4.5 mm punch aortotomies with running 6-0 Prolene sutures.  An end-to-side anastomosis then was performed of the diagonal vein to the side of the sequential vein with a running 7-0 Prolene suture.  At the completion of the final proximal anastomosis, lidocaine was administered.  The aortic root was de-aired, the bulldog clamp was removed from the left mammary artery, and the aortic crossclamp was removed.  The total crossclamp time was 87 minutes.  The patient initially fibrillated.  He did not respond to defibrillation attempts with 10 joules, but did respond to 20 joules and then  was in sinus rhythm thereafter.  While rewarming was completed, all proximal and distal anastomoses were inspected for hemostasis.  Epicardial pacing wires were placed on the right ventricle and right atrium and DDD pacing was initiated.  When he rewarmed to a core temperature of 37 degrees Celsius, a dopamine infusion was initiated at 3 mcg/kg/minute and he weaned from cardiopulmonary bypass on the first attempt.  Total bypass time was 128 minutes.  The initial cardiac index was greater than 2 L/minute per meter squared. Postbypass transesophageal echocardiography showed mild mitral and tricuspid regurgitation, essentially unchanged.  There was some improvement in left ventricular wall motion on the dopamine infusion.  A test dose of protamine was administered and was well tolerated.  The atrial and aortic cannulae were removed.  Remainder of the protamine was administered.  There was some transient hypotension after giving the protamine, which responded to volume administration.  The chest was copiously irrigated with warm saline.  Hemostasis was achieved.  A left pleural and mediastinal chest tubes were placed in separate subcostal incisions.  Pericardium was reapproximated with interrupted 3-0 silk sutures.  It came together easily without tension.  The sternum was closed with a combination of single and double heavy gauge stainless steel wires.  The pectoralis fascia, subcutaneous tissue, and skin were closed in standard fashion.  All sponge, needle, and instrument counts were correct at the end of the procedure.  The patient was taken from the operating room to the surgical intensive care unit in fair condition.     Revonda Standard Roxan Hockey, M.D.     SCH/MEDQ  D:  05/05/2013  T:  05/06/2013  Job:  [id number]

## 2013-05-06 NOTE — Progress Notes (Signed)
Patient ID: [name] [name], male   DOB: [birth year], 62 y.o.   MRN: [id number]   SICU Evening Rounds:   Hemodynamically stable   Diuresed some but still a little positive today. Will give another dose of lasix tonight.  CBC    Component Value Date/Time   WBC 17.3* 05/06/2013 1656   RBC 3.33* 05/06/2013 1656   HGB 8.2* 05/06/2013 1656   HCT 26.2* 05/06/2013 1656   PLT 141* 05/06/2013 1656   MCV 78.7 05/06/2013 1656   MCH 24.6* 05/06/2013 1656   MCHC 31.3 05/06/2013 1656   RDW 22.1* 05/06/2013 1656   LYMPHSABS 2.8 05/02/2013 1345   MONOABS 1.6* 05/02/2013 1345   EOSABS 0.4 05/02/2013 1345   BASOSABS 0.0 05/02/2013 1345     BMET    Component Value Date/Time   NA 137 05/06/2013 0428   K 4.2 05/06/2013 0428   CL 104 05/06/2013 0428   CO2 23 05/06/2013 0428   GLUCOSE 124* 05/06/2013 0428   BUN 14 05/06/2013 0428   CREATININE 0.93 05/06/2013 1656   CALCIUM 8.9 05/06/2013 0428   GFRNONAA 89* 05/06/2013 1656   GFRAA >90 05/06/2013 1656     A/P:  Stable postop course. Continue current plans

## 2013-05-06 NOTE — Progress Notes (Signed)
1 Day Post-Op Procedure(s) (LRB): CORONARY ARTERY BYPASS GRAFTING (CABG) TIMES FIVE USING LEFT INTERNAL MAMMARY ARTERY AND RIGHT AND LEFT SAPHENOUS LEG VEIN HARVESTED ENDOSCOPICALLY (N/A) Subjective: No complaints  Objective: Vital signs in last 24 hours: Temp:  [95.7 F (35.4 C)-101.8 F (38.8 C)] 99 F (37.2 C) (11/15 0800) Pulse Rate:  [74-91] 88 (11/15 0800) Cardiac Rhythm:  [-] Normal sinus rhythm (11/15 0800) Resp:  [8-31] 31 (11/15 0800) BP: (84-109)/(46-72) 105/60 mmHg (11/15 0800) SpO2:  [99 %-100 %] 99 % (11/15 0832) Arterial Line BP: (81-161)/(41-77) 125/58 mmHg (11/15 0800) FiO2 (%):  [30 %-50 %] 30 % (11/15 0000) Weight:  [89.3 kg (196 lb 13.9 oz)] 89.3 kg (196 lb 13.9 oz) (11/15 0500)  Hemodynamic parameters for last 24 hours: PAP: (21-44)/(6-24) 44/24 mmHg CO:  [4.2 L/min-5 L/min] 4.5 L/min CI:  [2.1 L/min/m2-2.6 L/min/m2] 2.3 L/min/m2  Intake/Output from previous day: 11/14 0701 - 11/15 0700 In: 5572.1 [I.V.:3900.1; Blood:482; NG/GT:90; IV [id number] Out: [id number] [Urine:5620; Blood:800; Chest Tube:330] Intake/Output this shift: Total I/O In: 276.7 [P.O.:120; I.V.:56.7; IV Piggyback:100] Out: 190 [Urine:150; Chest Tube:40]  General appearance: alert and cooperative Neurologic: intact Heart: regular rate and rhythm, S1, S2 normal, no murmur, click, rub or gallop Lungs: rhonchi bilaterally Extremities: edema mild Wound: chest dressing dry  Lab Results:  Recent Labs  05/05/13 1845 05/05/13 1853 05/06/13 0428  WBC 11.9*  --  16.5*  HGB 9.2* 10.9* 8.5*  HCT 28.9* 32.0* 26.7*  PLT 138*  --  147*   BMET:  Recent Labs  05/05/13 0402  05/05/13 1853 05/06/13 0428  NA 135  < > 139 137  K 3.9  < > 4.8 4.2  CL 98  --  105 104  CO2 26  --   --  23  GLUCOSE 96  < > 130* 124*  BUN 20  --  15 14  CREATININE 1.03  < > 1.00 0.95  CALCIUM 8.8  --   --  8.9  < > = values in this interval not displayed.  PT/INR:  Recent Labs  05/05/13 1330  LABPROT  18.3*  INR 1.57*   ABG    Component Value Date/Time   PHART 7.389 05/06/2013 0425   HCO3 25.0* 05/06/2013 0425   TCO2 26 05/06/2013 0425   ACIDBASEDEF 1.0 05/05/2013 1922   O2SAT 98.0 05/06/2013 0425   CBG (last 3)   Recent Labs  05/06/13 0525 05/06/13 0613 05/06/13 0705  GLUCAP 102* 104* 108*    Assessment/Plan: S/P Procedure(s) (LRB): CORONARY ARTERY BYPASS GRAFTING (CABG) TIMES FIVE USING LEFT INTERNAL MAMMARY ARTERY AND RIGHT AND LEFT SAPHENOUS LEG VEIN HARVESTED ENDOSCOPICALLY (N/A) Mobilize Diuresis Diabetes control: Hgb A1c 5.5 preop. Will give him a dose of Levemir today to transition off insulin drip and continue SSI. d/c tubes/lines Continue foley due to diuresing patient and patient in ICU See progression orders WOC consult for right foot wound. Expected acute blood loss anemia: observe   LOS: 14 days    [name] [name] K 05/06/2013

## 2013-05-06 NOTE — Progress Notes (Signed)
Patient ID: [name] [name], male   DOB: [birth year], 62 y.o.   MRN: [id number]    SUBJECTIVE: CABG yesterday.  Doing well, off pressors.  Just on amiodarone gtt now.   Cath last week with severe 3V CAD. RHC as below.  Ao: 105/59  LV: 110/10/19  RA: 13  RV: 42/5//13  PA: 47/15 (mean 31)  PCWP: 19  Fick Cardiac Output: 10 L/min  Fick Cardiac Index: 4.86 L/min/m2  PVR: 1.2 WU Central Aortic Saturation: 87%  Pulmonary Artery Saturation: 68%  Medications   . acetaminophen  1,000 mg Oral Q6H   Or  . acetaminophen (TYLENOL) oral liquid 160 mg/5 mL  1,000 mg Per Tube Q6H  . antiseptic oral rinse  15 mL Mouth Rinse BID  . aspirin EC  325 mg Oral Daily   Or  . aspirin  324 mg Per Tube Daily  . bisacodyl  10 mg Oral Daily   Or  . bisacodyl  10 mg Rectal Daily  . docusate sodium  200 mg Oral Daily  . enoxaparin (LOVENOX) injection  40 mg Subcutaneous QHS  . furosemide  40 mg Intravenous Once  . insulin aspart  0-24 Units Subcutaneous Q4H  . insulin detemir  10 Units Subcutaneous Once  . insulin regular  0-10 Units Intravenous TID WC  . metoCLOPramide (REGLAN) injection  10 mg Intravenous Q6H  . metoprolol tartrate  12.5 mg Oral BID   Or  . metoprolol tartrate  12.5 mg Per Tube BID  . [START ON 05/07/2013] pantoprazole  40 mg Oral Daily  . potassium chloride  40 mEq Oral Once  . simvastatin  20 mg Oral q1800  . sodium chloride  3 mL Intravenous Q12H  . tiotropium  18 mcg Inhalation Daily  amiodarone gtt   Filed Vitals:   05/06/13 0800 05/06/13 0832 05/06/13 0900 05/06/13 1000  BP: 105/60  104/69 92/64  Pulse: 88  87 87  Temp: 99 F (37.2 C)  98.8 F (37.1 C) 98.2 F (36.8 C)  TempSrc:      Resp: 31  25 14   Height:      Weight:      SpO2: 100% 99% 100% 100%    Intake/Output Summary (Last 24 hours) at 05/06/13 1056 Last data filed at 05/06/13 1000  Gross per 24 hour  Intake 6195.06 ml  Output   5970 ml  Net 225.06 ml    LABS: Basic Metabolic Panel:  Recent Labs  05/05/13 0402  05/05/13 1845 05/05/13 1853 05/06/13 0428  NA 135  < >  --  139 137  K 3.9  < >  --  4.8 4.2  CL 98  --   --  105 104  CO2 26  --   --   --  23  GLUCOSE 96  < >  --  130* 124*  BUN 20  --   --  15 14  CREATININE 1.03  --  0.89 1.00 0.95  CALCIUM 8.8  --   --   --  8.9  MG  --   --  3.1*  --  2.5  < > = values in this interval not displayed. Liver Function Tests:  Recent Labs  05/04/13 0529 05/05/13 0402  AST 32 25  ALT 83* 57*  ALKPHOS 78 82  BILITOT 2.0* 1.2  PROT 8.3 7.1  ALBUMIN 3.1* 2.7*   No results found for this basename: LIPASE, AMYLASE,  in the last 72 hours CBC:  Recent Labs  05/05/13 1845 05/05/13 1853 05/06/13 0428  WBC 11.9*  --  16.5*  HGB 9.2* 10.9* 8.5*  HCT 28.9* 32.0* 26.7*  MCV 77.9*  --  78.3  PLT 138*  --  147*    RADIOLOGY: Ct Abdomen Pelvis Wo Contrast  04/22/2013   CLINICAL DATA:  Abdominal pain and distention.  EXAM: CT ABDOMEN AND PELVIS WITHOUT CONTRAST  TECHNIQUE: Multidetector CT imaging of the abdomen and pelvis was performed following the standard protocol without intravenous contrast.  COMPARISON:  04/21/2013  FINDINGS: Mild ascites again seen within the abdomen and pelvis, without significant change. The liver, gallbladder, spleen, pancreas, adrenal glands, and kidneys are unremarkable in appearance except for small right renal cyst. Vicarious excretion of contrast noted in the gallbladder as well as mild residual contrast enhancement of both kidneys from the most recent CT. No soft tissue masses or lymphadenopathy identified within the abdomen or pelvis. Residual contrast seen in the urinary bladder.  Diverticulosis is seen mainly involving the sigmoid colon, however there is no evidence of diverticulitis. No focal inflammatory process or abscess identified. No evidence of bowel obstruction or hernia.  IMPRESSION: Mild ascites, without significant change since prior exam.  Diverticulosis. No radiographic evidence of  diverticulitis.   Electronically Signed   By: Earle Gell M.D.   On: 04/22/2013 20:39   Dg Chest 2 View  04/22/2013   CLINICAL DATA:  Chest pain and vomiting  EXAM: CHEST  2 VIEW  COMPARISON:  04/21/2013  FINDINGS: The heart is again enlarged in size but stable. The lungs are well aerated bilaterally. Some very mild patchy changes are noted in the right upper lobe which may represent some early infiltrate.  IMPRESSION: Stable cardiomegaly.  New patchy changes in the right upper lobe which may represent an early infiltrate.   Electronically Signed   By: Inez Catalina M.D.   On: 04/22/2013 14:51   US Abdomen Complete  04/22/2013   CLINICAL DATA:  Abdominal pain.  EXAM: ULTRASOUND ABDOMEN COMPLETE  COMPARISON:  None.  FINDINGS: Gallbladder  No gallstones identified. Mild diffuse gallbladder wall thickening seen measuring 4 mm, however no sonographic Murphy sign was noted.  Common bile duct  Diameter: 3 mm  Liver  No focal lesion identified. Within normal limits in parenchymal echogenicity. Mild perihepatic ascites noted.  IVC  No abnormality visualized.  Pancreas  Visualized portion unremarkable.  Spleen  Size and appearance within normal limits.  Right Kidney  Length: 10.3 cm. Echogenicity within normal limits. No mass or hydronephrosis visualized.  Left Kidney  Length: 10.4 cm. Echogenicity within normal limits. No mass or hydronephrosis visualized.  Abdominal aorta  No aneurysm visualized.  IMPRESSION: No evidence of gallstones or biliary dilatation.  Mild diffuse gallbladder wall thickening, which is a nonspecific finding. Mild ascites also noted.  Unremarkable sonographic appearance of the liver.   Electronically Signed   By: Earle Gell M.D.   On: 04/22/2013 16:46   Dg Chest Port 1 View  04/23/2013   CLINICAL DATA:  62 year old male with reason chest pain and vomiting. Fluid status.  EXAM: PORTABLE CHEST - 1 VIEW  COMPARISON:  04/22/2013 and earlier.  FINDINGS: Portable AP semi upright view at 2056 hrs.  Stable cardiomegaly and mediastinal contours. Previously seen subtle patchy right lung opacity does not persist. No pulmonary edema, pleural effusion, pneumothorax, or consolidation. Stable visualized osseous structures.  IMPRESSION: Interval resolved nonspecific patchy pulmonary opacity. Stable cardiomegaly and no acute cardiopulmonary abnormality.   Electronically Signed   By: Truman Hayward  [place] Crane M.D.   On: 04/23/2013 21:09    PHYSICAL EXAM General: NAD HEENT: normal except for poor dentition Neck: JVP 8-9 cm, no thyromegaly or thyroid nodule.  Lungs: Decreased breath sounds at bases. CV: Nondisplaced PMI.  Heart regular S1/S2, no S3/S4, 1/6 HSM LLSB.  No edema.  No carotid bruit.   Abdomen: Soft, nontender, no hepatosplenomegaly mild distention (improving).  Neurologic: A&O x 3 Psych: Normal affect. Extremities: No clubbing or cyanosis.   TELEMETRY: Reviewed telemetry pt in NSR  ASSESSMENT AND PLAN: 62 yo with history of COPD, diastolic CHF, and HTN presented with abdominal distention and elevated LFTs in setting of ADHF  Echo showed EF 20-25% with moderate RV dysfunction.  1. Elevated LFTs:  Much improved. Likely due to congestion from HF as well as shock liver. May have component of fatty liver. Hepatitis panels negative.  2. Acute on chronic systolic CHF: EF 0000000 on echo with moderate RV dysfunction 3. AKI on probable CKD.  Resolved, creatinine back to normal range.  4. 3V CAD: s/p CABG yesterday.    Stable post-CABG, off pressors, on amiodarone for atrial fibrillation prophylaxis.  Doing well so far, agree with Lasix 40 mg IV x 1 today to keep I/Os net negative.   Loralie Champagne 05/06/2013 10:56 AM

## 2013-05-07 ENCOUNTER — Inpatient Hospital Stay (HOSPITAL_COMMUNITY): Payer: Medicaid Other

## 2013-05-07 LAB — GLUCOSE, CAPILLARY
Glucose-Capillary: 100 mg/dL — ABNORMAL HIGH (ref 70–99)
Glucose-Capillary: 101 mg/dL — ABNORMAL HIGH (ref 70–99)
Glucose-Capillary: 99 mg/dL (ref 70–99)

## 2013-05-07 LAB — BASIC METABOLIC PANEL
BUN: 14 mg/dL (ref 6–23)
CO2: 26 mEq/L (ref 19–32)
Creatinine, Ser: 0.97 mg/dL (ref 0.50–1.35)
GFR calc Af Amer: >90 mL/min (ref >90)
GFR calc non Af Amer: 87 mL/min — ABNORMAL LOW (ref >90)
Glucose, Bld: 93 mg/dL (ref 70–99)

## 2013-05-07 LAB — CBC
MCHC: 31.7 g/dL (ref 30.0–36.0)
MCV: 78.6 fL (ref 78.0–100.0)
RDW: 22.6 % — ABNORMAL HIGH (ref 11.5–15.5)

## 2013-05-07 MED ORDER — ACETAMINOPHEN 325 MG PO TABS: 650.0000 mg | ORAL_TABLET | Freq: Four times a day (QID) | ORAL | Status: DC | PRN

## 2013-05-07 MED ORDER — CARVEDILOL 3.125 MG PO TABS
3.1250 mg | ORAL_TABLET | Freq: Two times a day (BID) | ORAL | Status: DC
  Administered 2013-05-08 – 2013-05-11 (×6): 3.125 mg via ORAL
  Filled 2013-05-07 (×9): qty 1

## 2013-05-07 MED ORDER — OXYCODONE HCL 5 MG PO TABS
5.0000 mg | ORAL_TABLET | ORAL | Status: DC | PRN
  Administered 2013-05-08 – 2013-05-09 (×5): 10 mg via ORAL
  Filled 2013-05-07 (×5): qty 2

## 2013-05-07 MED ORDER — ONDANSETRON HCL 4 MG PO TABS: 4.0000 mg | ORAL_TABLET | Freq: Four times a day (QID) | ORAL | Status: DC | PRN

## 2013-05-07 MED ORDER — SODIUM CHLORIDE 0.9 % IJ SOLN: 3.0000 mL | INTRAMUSCULAR | Status: DC | PRN

## 2013-05-07 MED ORDER — FUROSEMIDE 40 MG PO TABS
40.0000 mg | ORAL_TABLET | Freq: Every day | ORAL | Status: DC
  Administered 2013-05-08 – 2013-05-10 (×3): 40 mg via ORAL
  Filled 2013-05-07 (×5): qty 1

## 2013-05-07 MED ORDER — FAMOTIDINE 20 MG PO TABS
20.0000 mg | ORAL_TABLET | Freq: Two times a day (BID) | ORAL | Status: DC
  Administered 2013-05-07 – 2013-05-11 (×8): 20 mg via ORAL
  Filled 2013-05-07 (×9): qty 1

## 2013-05-07 MED ORDER — ONDANSETRON HCL 4 MG/2ML IJ SOLN: 4.0000 mg | Freq: Four times a day (QID) | INTRAMUSCULAR | Status: DC | PRN

## 2013-05-07 MED ORDER — TRAMADOL HCL 50 MG PO TABS
50.0000 mg | ORAL_TABLET | ORAL | Status: DC | PRN
  Administered 2013-05-07 – 2013-05-08 (×3): 100 mg via ORAL
  Filled 2013-05-07 (×3): qty 2

## 2013-05-07 MED ORDER — ASPIRIN EC 325 MG PO TBEC
325.0000 mg | DELAYED_RELEASE_TABLET | Freq: Every day | ORAL | Status: DC
  Administered 2013-05-08 – 2013-05-11 (×4): 325 mg via ORAL
  Filled 2013-05-07 (×4): qty 1

## 2013-05-07 MED ORDER — SODIUM CHLORIDE 0.9 % IV SOLN: 250.0000 mL | INTRAVENOUS | Status: DC | PRN

## 2013-05-07 MED ORDER — SODIUM CHLORIDE 0.9 % IJ SOLN
3.0000 mL | Freq: Two times a day (BID) | INTRAMUSCULAR | Status: DC
  Administered 2013-05-07 – 2013-05-10 (×7): 3 mL via INTRAVENOUS

## 2013-05-07 MED ORDER — MOVING RIGHT ALONG BOOK
Freq: Once | Status: AC
  Administered 2013-05-07: 18:00:00
  Filled 2013-05-07: qty 1

## 2013-05-07 MED ORDER — POTASSIUM CHLORIDE CRYS ER 20 MEQ PO TBCR
20.0000 meq | EXTENDED_RELEASE_TABLET | Freq: Every day | ORAL | Status: DC
  Administered 2013-05-08: 20 meq via ORAL
  Filled 2013-05-07: qty 1

## 2013-05-07 MED ORDER — FERROUS GLUCONATE 324 (38 FE) MG PO TABS
324.0000 mg | ORAL_TABLET | Freq: Two times a day (BID) | ORAL | Status: DC
  Administered 2013-05-07 – 2013-05-11 (×9): 324 mg via ORAL
  Filled 2013-05-07 (×10): qty 1

## 2013-05-07 MED ORDER — DOCUSATE SODIUM 100 MG PO CAPS
200.0000 mg | ORAL_CAPSULE | Freq: Every day | ORAL | Status: DC
  Administered 2013-05-08 – 2013-05-10 (×3): 200 mg via ORAL
  Filled 2013-05-07 (×4): qty 2

## 2013-05-07 MED ORDER — BISACODYL 10 MG RE SUPP: 10.0000 mg | Freq: Every day | RECTAL | Status: DC | PRN

## 2013-05-07 MED ORDER — BISACODYL 5 MG PO TBEC: 10.0000 mg | DELAYED_RELEASE_TABLET | Freq: Every day | ORAL | Status: DC | PRN

## 2013-05-07 NOTE — Progress Notes (Signed)
2 Days Post-Op Procedure(s) (LRB): CORONARY ARTERY BYPASS GRAFTING (CABG) TIMES FIVE USING LEFT INTERNAL MAMMARY ARTERY AND RIGHT AND LEFT SAPHENOUS LEG VEIN HARVESTED ENDOSCOPICALLY (N/A) Subjective:  No complaints  Objective: Vital signs in last 24 hours: Temp:  [98.1 F (36.7 C)-98.6 F (37 C)] 98.1 F (36.7 C) (11/16 0826) Pulse Rate:  [84-96] 88 (11/16 0900) Cardiac Rhythm:  [-] Normal sinus rhythm (11/16 0800) Resp:  [16-27] 18 (11/16 0900) BP: (95-117)/(56-83) 108/60 mmHg (11/16 0900) SpO2:  [96 %-100 %] 96 % (11/16 0900) Weight:  [88.7 kg (195 lb 8.8 oz)] 88.7 kg (195 lb 8.8 oz) (11/16 0500)  Hemodynamic parameters for last 24 hours:    Intake/Output from previous day: 11/15 0701 - 11/16 0700 In: 1567.2 [P.O.:750; I.V.:567.2; IV Piggyback:250] Out: 2730 [Urine:2680; Chest Tube:50] Intake/Output this shift: Total I/O In: -  Out: 150 [Urine:150]  General appearance: alert and cooperative Neurologic: intact Heart: regular rate and rhythm, S1, S2 normal, no murmur, click, rub or gallop Lungs: clear to auscultation bilaterally Extremities: edema mild in legs Wound: incision ok  Lab Results:  Recent Labs  05/06/13 1656 05/06/13 1701 05/07/13 0452  WBC 17.3*  --  17.8*  HGB 8.2* 10.2* 7.8*  HCT 26.2* 30.0* 24.6*  PLT 141*  --  172   BMET:  Recent Labs  05/06/13 0428  05/06/13 1701 05/07/13 0452  NA 137  --  134* 135  K 4.2  --  3.6 4.4  CL 104  --  105 100  CO2 23  --   --  26  GLUCOSE 124*  --  99 93  BUN 14  --  13 14  CREATININE 0.95  < > 1.10 0.97  CALCIUM 8.9  --   --  9.3  < > = values in this interval not displayed.  PT/INR:  Recent Labs  05/05/13 1330  LABPROT 18.3*  INR 1.57*   ABG    Component Value Date/Time   PHART 7.389 05/06/2013 0425   HCO3 25.0* 05/06/2013 0425   TCO2 26 05/06/2013 1701   ACIDBASEDEF 1.0 05/05/2013 1922   O2SAT 98.0 05/06/2013 0425   CBG (last 3)   Recent Labs  05/06/13 2346 05/07/13 0407  05/07/13 0728  GLUCAP 124* 33 103*   CLINICAL DATA: Postop cardiac surgery.  EXAM:  PORTABLE CHEST - 1 VIEW  COMPARISON: 05/06/2013  FINDINGS:  Since the prior exam, all lines and tubes have been removed with the  exception of the right internal jugular Cordis. Mild hazy opacity  projects in the perihilar regions and along the lateral minor  fissure, most likely atelectasis. Mild left lung base opacity is  likely combination of a small effusion and atelectasis. No  pneumothorax. No pulmonary edema.  Cardiac silhouette is mildly enlarged. No mediastinal widening.  IMPRESSION:  Mild perihilar and medial lung base atelectasis. No overt edema. No  pneumothorax. No evidence of an operative complication.  Electronically Signed  By: Lajean Manes M.D.  On: 05/07/2013 07:53  Assessment/Plan: S/P Procedure(s) (LRB): CORONARY ARTERY BYPASS GRAFTING (CABG) TIMES FIVE USING LEFT INTERNAL MAMMARY ARTERY AND RIGHT AND LEFT SAPHENOUS LEG VEIN HARVESTED ENDOSCOPICALLY (N/A) Hemodynamically stable in sinus rhythm on po amiodarone Will resume Coreg. Wait on ACE I until BP is higher and diuresed. Expected acute blood loss anemia: observe. Start Fe2+ Mobilize Volume excess. Continue diuresis Plan for transfer to step-down: see transfer orders   LOS: 15 days    BARTLE,BRYAN K 05/07/2013

## 2013-05-07 NOTE — Progress Notes (Signed)
Called to room by pt who stated he feels like he needs some oxygen. Pt not in distress. Resp. 20 even and unlabored. O2 sat on RA 97% b/p 104/66, HR -92 NSR. Pt placed on 2L O2 for comfort for night. Will continue to assess.

## 2013-05-07 NOTE — Anesthesia Postprocedure Evaluation (Addendum)
  Anesthesia Post-op Note  Patient: [name] [name]  Procedure(s) Performed: Procedure(s): CORONARY ARTERY BYPASS GRAFTING (CABG) TIMES FIVE USING LEFT INTERNAL MAMMARY ARTERY AND RIGHT AND LEFT SAPHENOUS LEG VEIN HARVESTED ENDOSCOPICALLY (N/A)  Patient Location: Nursing Unit  Anesthesia Type:General  Level of Consciousness: awake and alert   Airway and Oxygen Therapy: Patient Spontanous Breathing  Post-op Pain: mild  Post-op Assessment: Post-op VS reviewed, Cardiac and Pulmonary status stable  Post-op Vital Signs: Reviewed and stable  Complications: No apparent anesthesia complications

## 2013-05-07 NOTE — Progress Notes (Signed)
Family Practice Teaching Service Social/Continuity Note  Stopped by to visit patient in his new room on [address]. Patient in NAD, states he is doing well except is having difficulty with bedside handheld urinal. CNA will be in shortly to help patient with urination.  Reviewed recent notes by cardiothoracic surgery, also recent labwork and vitals. Appreciate excellent care by surgery team. Family Medicine Team will continue to follow along, and reassume care when appropriate.  Chrisandra Netters, MD Family Medicine PGY-2

## 2013-05-07 NOTE — Progress Notes (Signed)
Pt states he is feeling better, and pain is being relieved by pain meds received. Incentive spirometer done x3 to 750. Encouraged pt to do IS while awake.

## 2013-05-07 NOTE — Consult Note (Addendum)
WOC wound consult note Reason for Consult:Patient consult placed for third time this admission for same wound on right anterior foot:  My partner most recently followed up on this wound on Friday, 05/05/13 Wound type:Healing full thickness wound, now granulating in and nearly reepithelialized Pressure Ulcer POA: No Measurement:2x2x0.1cm an d0.2cm x 0.2cm x 0cm Wound bed:100% pink na d reepithelializing Drainage (amount, consistency, odor) none Periwound:evidence of previous wound healing apparent. Dressing procedure/placement/frequency:Continue soft silicone dressing until 2 weeks post closure. [facility] nursing team will not follow, but will remain available to this patient, the nursing and medical team.  Please re-consult if needed. Thanks, Maudie Flakes, MSN, RN, [place], [place], [place] [phone])

## 2013-05-08 ENCOUNTER — Encounter (HOSPITAL_COMMUNITY): Payer: Self-pay | Admitting: Dentistry

## 2013-05-08 DIAGNOSIS — Z951 Presence of aortocoronary bypass graft: Secondary | ICD-10-CM

## 2013-05-08 LAB — CBC
MCH: 24.6 pg — ABNORMAL LOW (ref 26.0–34.0)
MCHC: 31.2 g/dL (ref 30.0–36.0)
Platelets: 180 10*3/uL (ref 150–400)
RBC: 2.93 MIL/uL — ABNORMAL LOW (ref 4.22–5.81)

## 2013-05-08 LAB — BASIC METABOLIC PANEL
CO2: 26 mEq/L (ref 19–32)
Calcium: 9.2 mg/dL (ref 8.4–10.5)
Chloride: 100 mEq/L (ref 96–112)
GFR calc non Af Amer: 88 mL/min — ABNORMAL LOW (ref >90)
Glucose, Bld: 89 mg/dL (ref 70–99)
Sodium: 135 mEq/L (ref 135–145)

## 2013-05-08 MED ORDER — SPIRONOLACTONE 12.5 MG HALF TABLET
12.5000 mg | ORAL_TABLET | Freq: Every day | ORAL | Status: DC
  Administered 2013-05-09 – 2013-05-11 (×3): 12.5 mg via ORAL
  Filled 2013-05-08 (×3): qty 1

## 2013-05-08 MED ORDER — LISINOPRIL 5 MG PO TABS
5.0000 mg | ORAL_TABLET | Freq: Every day | ORAL | Status: DC
  Administered 2013-05-09 – 2013-05-11 (×3): 5 mg via ORAL
  Filled 2013-05-08 (×3): qty 1

## 2013-05-08 NOTE — Progress Notes (Signed)
CARDIAC REHAB PHASE I   PRE:  Rate/Rhythm: 83SR  BP:  Supine:   Sitting: 102/63  Standing:    SaO2: 97%RA  MODE:  Ambulation: 500 ft   POST:  Rate/Rhythm: 100 SR  BP:  Supine:   Sitting: 122/69  Standing:    SaO2: 98%RA [id number] Pt walked 500 ft on RA with rolling walker, gait belt use and asst x 1. Hard to get to standing position, c/o left foot sore. Used gait belt but foot got better as he walked. Stopped twice to rest. Tolerated well. To bed for pacing wire removal.   Graylon Good, RN [name]  05/08/2013 8:49 AM

## 2013-05-08 NOTE — Progress Notes (Addendum)
      [address]       [place] [address]             [phone]      3 Days Post-Op Procedure(s) (LRB): CORONARY ARTERY BYPASS GRAFTING (CABG) TIMES FIVE USING LEFT INTERNAL MAMMARY ARTERY AND RIGHT AND LEFT SAPHENOUS LEG VEIN HARVESTED ENDOSCOPICALLY (N/A)  Subjective:  Mr. Iwanicki states he is doing okay.  He continues to have some shortness of breath, however he has been maintaining his oxygen saturation levels without oxygen.  He states he was not able to eat much this morning, due to food not tasting very good.  I encouraged patient to eat what he can tolerate, but its okay if he doesn't clean his plate. + Ambulation,  No BM  Objective: Vital signs in last 24 hours: Temp:  [97.7 F (36.5 C)-99 F (37.2 C)] 99 F (37.2 C) (11/17 0408) Pulse Rate:  [78-92] 87 (11/17 0408) Cardiac Rhythm:  [-] Normal sinus rhythm (11/16 2057) Resp:  [1-23] 19 (11/17 0408) BP: (83-116)/(48-70) 116/70 mmHg (11/17 0408) SpO2:  [96 %-100 %] 98 % (11/17 0408) Weight:  [195 lb 4.8 oz (88.587 kg)] 195 lb 4.8 oz (88.587 kg) (11/17 0421)  Intake/Output from previous day: 11/16 0701 - 11/17 0700 In: 180 [P.O.:120; I.V.:60] Out: 800 [Urine:800]  General appearance: alert, cooperative and no distress Heart: regular rate and rhythm Lungs: clear to auscultation bilaterally Abdomen: soft, non-tender; bowel sounds normal; no masses,  no organomegaly Extremities: edema trace Wound: clean and dry  Lab Results:  Recent Labs  05/07/13 0452 05/08/13 0338  WBC 17.8* 15.5*  HGB 7.8* 7.2*  HCT 24.6* 23.1*  PLT 172 180   BMET:  Recent Labs  05/07/13 0452 05/08/13 0338  NA 135 135  K 4.4 4.2  CL 100 100  CO2 26 26  GLUCOSE 93 89  BUN 14 17  CREATININE 0.97 0.95  CALCIUM 9.3 9.2    PT/INR:  Recent Labs  05/05/13 1330  LABPROT 18.3*  INR 1.57*   ABG    Component Value Date/Time   PHART 7.389 05/06/2013 0425   HCO3 25.0* 05/06/2013 0425   TCO2 26 05/06/2013 1701   ACIDBASEDEF 1.0 05/05/2013 1922   O2SAT 98.0 05/06/2013 0425   CBG (last 3)   Recent Labs  05/07/13 1548 05/07/13 1958 05/07/13 2341  GLUCAP 103* 99 101*    Assessment/Plan: S/P Procedure(s) (LRB): CORONARY ARTERY BYPASS GRAFTING (CABG) TIMES FIVE USING LEFT INTERNAL MAMMARY ARTERY AND RIGHT AND LEFT SAPHENOUS LEG VEIN HARVESTED ENDOSCOPICALLY (N/A)  1. CV- NSR, rate and pressure controlled- continue Amiodarone, Coreg 2. Pulm- + dyspnea, off oxygen, CXR looks good continue inhalers, continue IS 3. Renal- creatinine WNL, remains mildly volume overloaded will continue Lasix 4. Expected Acute Blood Loss Anemia- Hgb 7.2 down from 7.8 yesterday- may benefit from transfusion will discuss with staff 5. DM- CBGs controlled, continue current regimen 6. Right Diabetic Ulcer- wound care evaluated, has made recommendations signed off 7. Dispo- patient doing well, +dyspnea which may be improved with transfusion, will d/c EPW, possibly d/c in next 24-48 hrs   LOS: 16 days    BARRETT, ERIN 05/08/2013  Patient seen and examined, agree with above Will restart spironolactone and lisinopril tomorrow

## 2013-05-08 NOTE — Progress Notes (Signed)
Family Practice Teaching Service Social/Continuity Note  Stopped by to visit patient. He states he is doing well. No complaints today. Appreciate excellent care by surgery team. Family Medicine Team will continue to follow along, and reassume care when appropriate.  Chrisandra Netters, MD Family Medicine PGY-2

## 2013-05-08 NOTE — Consult Note (Signed)
Physician Discharge Summary  Patient ID: [name] [name] MRN: [id number] DOB/AGE: Mar 09, 1951 62 y.o.  Admit date: 04/22/2013 Discharge date: 05/11/2013  Admission Diagnoses:  Patient Active Problem List   Diagnosis Date Noted  . S/P CABG x 5 05/08/2013  . Right foot ulcer 05/04/2013  . Coronary atherosclerosis of native coronary artery 04/28/2013  . Acute systolic heart failure [id number]  . Abnormal blood chemistry 12/30/2012  . Pneumonia 05/18/2011  . Gout 05/17/2011  . Respiratory distress, acute 05/17/2011  . Hypertension 05/17/2011  . COPD (chronic obstructive pulmonary disease) 05/17/2011  . CHF, acute 05/17/2011   Discharge Diagnoses:   Patient Active Problem List   Diagnosis Date Noted  . S/P CABG x 5 05/08/2013  . Right foot ulcer 05/04/2013  . Coronary atherosclerosis of native coronary artery 04/28/2013  . Acute systolic heart failure [id number]  . Abnormal blood chemistry 12/30/2012  . Pneumonia 05/18/2011  . Gout 05/17/2011  . Respiratory distress, acute 05/17/2011  . Hypertension 05/17/2011  . COPD (chronic obstructive pulmonary disease) 05/17/2011  . CHF, acute 05/17/2011   Discharged Condition: good  History of Present Illness:   [name] [name] is a 62 yo male with multiple cardiac risk factors including hypertension, hyperlipidemia, DM, and tobacco abuse. He also has known history of CHF and COPD. He was admitted to [facility] in July of this year with an exacerbation of his CHF and weakness. He apparently developed myopathy, rhabdomyolysis and VDRF due to pneumonia. He apparently also had a bradycardic arrest. An echo there showed an EF of 30%.  The patient had been in his usual state of health since that time.  However, he presented to [facility] on 11/1 with complaint of abdominal distention and discomfort. He was also complaining of swelling in his legs, and increased SOB. He had a mild cough. He denied fever or chills.  Work up revealed markedly  elevated liver enzymes, jaundice, coagulopathy and acute kidney dysfunction with hematuria. He was noted to be in acute decompensated right and left heart diastolic and systolic failure.  He was admitted by the family medicine service placed on empiric antibiotic coverage and further workup was initiated.    Hospital Course:   The patient further declined and was ruled in for NSTEMI and Cardiology consult was obtained.  Patient continued to have elevated Liver Enzymes and Transaminases and GI consult was also obtained and after completion of workup felt this was most likely due to passive congestion and some element due to ischemia with right sided heart failure.  He had an echo on 04/25/2013 which showed severe LV dysfunction with an EF of 20-25%, mild MR and moderate TR.  He was closely monitored by Cardiology and once INR level had trended down he was taken for cardiac catheterization on 04/28/2013 which confirmed the presence of multivessel CAD and a reduced EF.  It was felt the patient would benefit from Coronary Bypass procedure and TCTS was consulted.  The patient was evaluated by Dr. Roxan Hockey who was in agreement patient would benefit from surgical intervention which would likely help with his right sided heart failure as well.  The risks and benefits of the procedure were explained to the patient and he was agreeable to proceed.  He was also noted to have poor dentition and it was felt dental consult would be of benefit.  The patient was evaluated by Dr. Enrique Sack who felt the patient would benefit from extraction and alveoloplasty of remaining teeth.  The risks and benefits of the  procedure were explained and the patient was agreeable to proceed.  This was performed on 05/03/2013 and the patient tolerated without difficulty.  The patient continued to improve with medical treatment and was deemed medically stable to proceed with Coronary Bypass procedure.  The patient was taken to the operating room on  05/05/2013.  He underwent CABG x 5 utilizing LIMA to LAD, SVG to Diagonal, Sequential SVG to Ramus Intermediate and OM2, and SVG to PDA.  He also underwent Endoscopic Saphenous vein harvest from right and left thigh.  He tolerated the procedure well and was taken to the SICU in stable condition.  During his stay in the ICU the patient was extubated.  He was weaned off all cardiac drips.  His chest tubes and arterial lines removed without difficulty.  He had a diabetic ulcer on his right foot and wound care was consulted.  It was felt the wound was in the healing stages, with granulation and nearly re-epithelialized.  It was recommended to continue a soft silicone dressing for 2 weeks post closure.  The patient has been maintaining NSR and was transferred to the step down unit in stable condition.  The patient continues to progress.  He does have expected acute blood loss anemia with a Hgb of 7.2.  He was maintaining good oxygen saturations and was therefore started on Iron supplements.  The patient continues to maintain NSR and his pacing wires have been removed.  He is ambulating with use of rolling walker and tolerating a cardiac diet.  Should the patient continue to progress we anticipate discharge home in the next 24-48 hours.  He will need to follow up with Dr. Roxan Hockey in 3 weeks with a chest xray prior to his appointment. He will also need to follow up with Dr. Sung Amabile in 2 weeks.           Consults: cardiology, GI and wound care  Significant Diagnostic Studies: angiography:   Hemodynamic Findings:  Ao: 105/59  LV: 110/10/19  RA: 13  RV: 42/5//13  PA: 47/15 (mean 31)  PCWP: 19  Fick Cardiac Output: 10 L/min  Fick Cardiac Index: 4.86 L/min/m2  Central Aortic Saturation: 87%  Pulmonary Artery Saturation: 68%  Angiographic Findings:  Left main: 10% stenosis.  Left Anterior Descending Artery: Large caliber vessel that courses to the apex. Diffuse 40% proximal stenosis. The mid vessel has a  focal 70% stenosis. The distal vessel has diffuse mild plaque. The diagonal branch is moderate in caliber, 50% proximal stenosis.  Circumflex Artery: Moderate caliber vessel that has diffuse 60% proximal stenosis. The mid vessel has 99% stenosis followed by a 100% occlusion. The small to moderate caliber first obtuse marginal branch has diffuse 50% stenosis. The moderate caliber second obtuse marginal branch fills from left to left collaterals.  Right Coronary Artery: Large dominant vessel with 100% proximal occlusion. The mid, distal vessel fills from left to right collaterals.  Left Ventricular Angiogram: Deferred.   Treatments: surgery:   Median sternotomy, extracorporeal circulation, coronary artery bypass grafting x5 (left internal mammary artery to left anterior descending, saphenous vein graft to second diagonal, sequential saphenous vein graft to ramus intermedius and obtuse marginal 2, saphenous vein graft to posterior descending, endoscopic vein harvest, both thighs).  Disposition: 01-Home or Self Care  Discharge Medications:    Medication List    STOP taking these medications       metoprolol succinate 25 MG 24 hr tablet  Commonly known as:  TOPROL-XL     potassium  chloride SA 20 MEQ tablet  Commonly known as:  K-DUR,KLOR-CON     RANEXA 1000 MG SR tablet  Generic drug:  ranolazine      TAKE these medications       albuterol 108 (90 BASE) MCG/ACT inhaler  Commonly known as:  PROVENTIL HFA;VENTOLIN HFA  Inhale 2 puffs into the lungs every 4 (four) hours as needed. For shortness of breath     allopurinol 300 MG tablet  Commonly known as:  ZYLOPRIM  Take 300 mg by mouth daily.     amiodarone 200 MG tablet  Commonly known as:  PACERONE  Take 1 tablet (200 mg total) by mouth 2 (two) times daily. For 7 days, then decrease to 200 mg once a day     aspirin 325 MG EC tablet  Take 1 tablet (325 mg total) by mouth daily.     b complex vitamins tablet  Take 1 tablet by  mouth daily.     carvedilol 3.125 MG tablet  Commonly known as:  COREG  Take 3.125 mg by mouth 2 (two) times daily with a meal.     colchicine 0.6 MG tablet  Take 0.6-1.2 mg by mouth daily. Take 2 tablets at the onset of gout symptoms, then take 1 tablet in an hour for a max of 3 tablets per 24 hours     ferrous gluconate 324 MG tablet  Commonly known as:  FERGON  Take 1 tablet (324 mg total) by mouth 2 (two) times daily with a meal.     furosemide 40 MG tablet  Commonly known as:  LASIX  Take 1 tablet (40 mg total) by mouth 2 (two) times daily.     lisinopril 5 MG tablet  Commonly known as:  PRINIVIL,ZESTRIL  Take 1 tablet (5 mg total) by mouth daily.     lubiprostone 24 MCG capsule  Commonly known as:  AMITIZA  Take 24 mcg by mouth 2 (two) times daily with a meal.     omeprazole 20 MG capsule  Commonly known as:  PRILOSEC  Take 20 mg by mouth daily.     oxyCODONE 5 MG immediate release tablet  Commonly known as:  Oxy IR/ROXICODONE  Take 1-2 tablets (5-10 mg total) by mouth every 3 (three) hours as needed for severe pain.     simvastatin 20 MG tablet  Commonly known as:  ZOCOR  Take 1 tablet (20 mg total) by mouth daily at 6 PM.     spironolactone 12.5 mg Tabs tablet  Commonly known as:  ALDACTONE  Take 0.5 tablets (12.5 mg total) by mouth daily.     tiotropium 18 MCG inhalation capsule  Commonly known as:  SPIRIVA  Place 18 mcg into inhaler and inhale daily.       The patient has been discharged on:   1.Beta Blocker:  Yes [ x  ]                              No   [   ]                              If No, reason:  2.Ace Inhibitor/ARB: Yes [ x  ]  No  [    ]                                     If No, reason:  3.Statin:   Yes [ x  ]                  No  [   ]                  If No, reason:  4.Ecasa:  Yes  [ x  ]                  No   [   ]                  If No, reason:    Medication List    STOP taking these  medications       metoprolol succinate 25 MG 24 hr tablet  Commonly known as:  TOPROL-XL     potassium chloride SA 20 MEQ tablet  Commonly known as:  K-DUR,KLOR-CON     RANEXA 1000 MG SR tablet  Generic drug:  ranolazine      TAKE these medications       albuterol 108 (90 BASE) MCG/ACT inhaler  Commonly known as:  PROVENTIL HFA;VENTOLIN HFA  Inhale 2 puffs into the lungs every 4 (four) hours as needed. For shortness of breath     allopurinol 300 MG tablet  Commonly known as:  ZYLOPRIM  Take 300 mg by mouth daily.     amiodarone 200 MG tablet  Commonly known as:  PACERONE  Take 1 tablet (200 mg total) by mouth 2 (two) times daily. For 7 days, then decrease to 200 mg once a day     aspirin 325 MG EC tablet  Take 1 tablet (325 mg total) by mouth daily.     b complex vitamins tablet  Take 1 tablet by mouth daily.     carvedilol 3.125 MG tablet  Commonly known as:  COREG  Take 3.125 mg by mouth 2 (two) times daily with a meal.     colchicine 0.6 MG tablet  Take 0.6-1.2 mg by mouth daily. Take 2 tablets at the onset of gout symptoms, then take 1 tablet in an hour for a max of 3 tablets per 24 hours     ferrous gluconate 324 MG tablet  Commonly known as:  FERGON  Take 1 tablet (324 mg total) by mouth 2 (two) times daily with a meal.     furosemide 40 MG tablet  Commonly known as:  LASIX  Take 1 tablet (40 mg total) by mouth 2 (two) times daily.     lisinopril 5 MG tablet  Commonly known as:  PRINIVIL,ZESTRIL  Take 1 tablet (5 mg total) by mouth daily.     lubiprostone 24 MCG capsule  Commonly known as:  AMITIZA  Take 24 mcg by mouth 2 (two) times daily with a meal.     omeprazole 20 MG capsule  Commonly known as:  PRILOSEC  Take 20 mg by mouth daily.     oxyCODONE 5 MG immediate release tablet  Commonly known as:  Oxy IR/ROXICODONE  Take 1-2 tablets (5-10 mg total) by mouth every 3 (three) hours as needed for severe pain.     simvastatin 20 MG tablet  Commonly  known as:  ZOCOR  Take  1 tablet (20 mg total) by mouth daily at 6 PM.     spironolactone 12.5 mg Tabs tablet  Commonly known as:  ALDACTONE  Take 0.5 tablets (12.5 mg total) by mouth daily.     tiotropium 18 MCG inhalation capsule  Commonly known as:  SPIRIVA  Place 18 mcg into inhaler and inhale daily.            Follow-up Information   Follow up with Melrose Nakayama, MD On 05/30/2013. (Appointment is at 12:00)    Specialty:  Cardiothoracic Surgery   Contact information:   [address] [place] [address] [phone]       Follow up with [facility] On 05/30/2013. (Please get CXR at 11:00)    Contact information:   [place]       Follow up with Loralie Champagne, MD. Schedule an appointment as soon as possible for a visit on 05/25/2013. (at 9:30 [place])    Specialty:  Cardiology   Contact information:   [id number] [place] [address] [phone]       Signed: Nani Skillern 05/12/2013, 1:20 PM

## 2013-05-09 MED ORDER — COLCHICINE 0.6 MG PO TABS
0.6000 mg | ORAL_TABLET | Freq: Every day | ORAL | Status: DC
  Administered 2013-05-09 – 2013-05-11 (×3): 0.6 mg via ORAL
  Filled 2013-05-09 (×4): qty 1

## 2013-05-09 MED FILL — Potassium Chloride Inj 2 mEq/ML: INTRAVENOUS | Qty: 40 | Status: AC

## 2013-05-09 MED FILL — Sodium Chloride IV Soln 0.9%: INTRAVENOUS | Qty: 1000 | Status: AC

## 2013-05-09 MED FILL — Magnesium Sulfate Inj 50%: INTRAMUSCULAR | Qty: 10 | Status: AC

## 2013-05-09 MED FILL — Heparin Sodium (Porcine) Inj 1000 Unit/ML: INTRAMUSCULAR | Qty: 30 | Status: AC

## 2013-05-09 NOTE — Progress Notes (Addendum)
      [address]       [place] [address]             [phone]      4 Days Post-Op Procedure(s) (LRB): CORONARY ARTERY BYPASS GRAFTING (CABG) TIMES FIVE USING LEFT INTERNAL MAMMARY ARTERY AND RIGHT AND LEFT SAPHENOUS LEG VEIN HARVESTED ENDOSCOPICALLY (N/A)  Subjective:  [name] [name] complains of some mild pain on his left side.  He is otherwise doing well, + Ambulation with walker + BM  Objective: Vital signs in last 24 hours: Temp:  [97.9 F (36.6 C)-98.8 F (37.1 C)] 98.8 F (37.1 C) (11/18 0528) Pulse Rate:  [85-89] 85 (11/18 0528) Cardiac Rhythm:  [-] Normal sinus rhythm (11/17 2033) Resp:  [16-18] 18 (11/18 0528) BP: (102-104)/(68-72) 104/72 mmHg (11/18 0528) SpO2:  [96 %-98 %] 97 % (11/18 0528) Weight:  [194 lb 14.2 oz (88.4 kg)] 194 lb 14.2 oz (88.4 kg) (11/18 0528)  Intake/Output from previous day: 11/17 0701 - 11/18 0700 In: 360 [P.O.:360] Out: 650 [Urine:650]  General appearance: alert, cooperative and no distress Heart: regular rate and rhythm Lungs: clear to auscultation bilaterally Abdomen: soft, non-tender; bowel sounds normal; no masses,  no organomegaly Extremities: edema trace Wound: clean and dry  Lab Results:  Recent Labs  05/07/13 0452 05/08/13 0338  WBC 17.8* 15.5*  HGB 7.8* 7.2*  HCT 24.6* 23.1*  PLT 172 180   BMET:  Recent Labs  05/07/13 0452 05/08/13 0338  NA 135 135  K 4.4 4.2  CL 100 100  CO2 26 26  GLUCOSE 93 89  BUN 14 17  CREATININE 0.97 0.95  CALCIUM 9.3 9.2    PT/INR: No results found for this basename: LABPROT, INR,  in the last 72 hours ABG    Component Value Date/Time   PHART 7.389 05/06/2013 0425   HCO3 25.0* 05/06/2013 0425   TCO2 26 05/06/2013 1701   ACIDBASEDEF 1.0 05/05/2013 1922   O2SAT 98.0 05/06/2013 0425   CBG (last 3)   Recent Labs  05/07/13 1548 05/07/13 1958 05/07/13 2341  GLUCAP 103* 99 101*    Assessment/Plan: S/P Procedure(s) (LRB): CORONARY ARTERY BYPASS GRAFTING  (CABG) TIMES FIVE USING LEFT INTERNAL MAMMARY ARTERY AND RIGHT AND LEFT SAPHENOUS LEG VEIN HARVESTED ENDOSCOPICALLY (N/A)  1. CV- NSR good rate and pressure control- on Amiodarone, Coreg, Lisinopril 2. Pulm- off oxygen, encouraged use of IS 3. Renal- remains mildly volume overloaded, on Lasix, will restart home Spironolactone today, will check BMET in AM 4. Expected Acute Blood Loss Anemia- on Fe Supplementation, ordered repeat H/H this morning 5. DM- CBGs controlled 6. Dispo- patient doing wel, maintaining NSR, will plan for d/c in AM if no changes occur, will arrange home health    LOS: 17 days    [name], [name] 05/09/2013  Patient seen and examined, agree with above He did have some pain in his left foot earlier today which limited his mobility. He thinks it is gout. He says it has eased off now. Will need to see how he does walking tomorrow before deciding whether to dc.

## 2013-05-09 NOTE — Progress Notes (Signed)
CARDIAC REHAB PHASE I   PRE:  Rate/Rhythm: 84SR  BP:  Supine:   Sitting: 103/69  Standing:    SaO2: 97%RA  MODE:  Ambulation: 68 ft   POST:  Rate/Rhythm: 85 SR  BP:  Supine:   Sitting: 114/67  Standing:    SaO2: 97%RA 1320-1400 Took 3 tries to get pt to standing position from sitting on side of bed. He could not bear weight on left foot. Pt states his gout is flaring up. Pt only able to walk 68 feet with gait belt use, rolling walker and asst x 1. He walked 500 ft yesterday. Told pt's RN. To bed with alarm on. Ambulation very painful.   Graylon Good, RN [name]  05/09/2013 1:56 PM

## 2013-05-09 NOTE — Progress Notes (Signed)
Patient was up to chair X2 today; patient is a 2 assist; patient has a lot of Gout pain; MD notified; Colchicine 0.6 mg ordered per MD; will continue to work with patient on getting out of bed to chair and ambulating in room; work on pain management  [name] [name], [name] [name]

## 2013-05-09 NOTE — Progress Notes (Signed)
Family Practice Teaching Service Social/Continuity Note  Family Medicine Team has followed from a distance today. We will reassume care when appropriate, although it seems [name] may be ready for discharge tomorrow. Appreciate excellent care by surgery team.   [name] Netters, MD Family Medicine PGY-2

## 2013-05-09 NOTE — Addendum Note (Signed)
Addendum created 05/09/13 1314 by Midge Minium, MD   Modules edited: Anesthesia Attestations

## 2013-05-10 LAB — BASIC METABOLIC PANEL
BUN: 18 mg/dL (ref 6–23)
Calcium: 9 mg/dL (ref 8.4–10.5)
GFR calc Af Amer: 76 mL/min — ABNORMAL LOW (ref >90)
GFR calc non Af Amer: 66 mL/min — ABNORMAL LOW (ref >90)
Glucose, Bld: 117 mg/dL — ABNORMAL HIGH (ref 70–99)
Sodium: 134 mEq/L — ABNORMAL LOW (ref 135–145)

## 2013-05-10 MED ORDER — INDOMETHACIN 25 MG PO CAPS
25.0000 mg | ORAL_CAPSULE | Freq: Two times a day (BID) | ORAL | Status: DC
  Administered 2013-05-10 – 2013-05-11 (×4): 25 mg via ORAL
  Filled 2013-05-10 (×5): qty 1

## 2013-05-10 MED ORDER — ALLOPURINOL 300 MG PO TABS
300.0000 mg | ORAL_TABLET | Freq: Every day | ORAL | Status: DC
  Administered 2013-05-10 – 2013-05-11 (×2): 300 mg via ORAL
  Filled 2013-05-10 (×2): qty 1

## 2013-05-10 MED ORDER — ENSURE COMPLETE PO LIQD
237.0000 mL | Freq: Two times a day (BID) | ORAL | Status: DC
  Administered 2013-05-10 – 2013-05-11 (×2): 237 mL via ORAL

## 2013-05-10 NOTE — Progress Notes (Signed)
Pt ambulated 200 ft with RW and left foot with ace wrap per request.  Tolerated well.  To bed after walk with call bell in reach.  Will con't plan of care.

## 2013-05-10 NOTE — Progress Notes (Addendum)
      [address]       [place] [address]             [phone]      5 Days Post-Op Procedure(s) (LRB): CORONARY ARTERY BYPASS GRAFTING (CABG) TIMES FIVE USING LEFT INTERNAL MAMMARY ARTERY AND RIGHT AND LEFT SAPHENOUS LEG VEIN HARVESTED ENDOSCOPICALLY (N/A)  Subjective:  [name] [name] complaints of left foot pain this morning.  He states his gout is flared up.  + BM  Objective: Vital signs in last 24 hours: Temp:  [98.2 F (36.8 C)-98.5 F (36.9 C)] 98.5 F (36.9 C) (11/19 0556) Pulse Rate:  [70-85] 70 (11/19 0556) Cardiac Rhythm:  [-] Normal sinus rhythm (11/19 0800) Resp:  [18-20] 18 (11/19 0556) BP: (97-106)/(55-67) 98/55 mmHg (11/19 0556) SpO2:  [96 %-99 %] 99 % (11/19 0815) Weight:  [195 lb 12.3 oz (88.8 kg)] 195 lb 12.3 oz (88.8 kg) (11/19 0556)  Intake/Output from previous day: 11/18 0701 - 11/19 0700 In: 840 [P.O.:840] Out: 1200 [Urine:1200]  General appearance: alert, cooperative and no distress Heart: regular rate and rhythm Lungs: clear to auscultation bilaterally Abdomen: soft, non-tender; bowel sounds normal; no masses,  no organomegaly Extremities: edema 1+ Wound: clean and dry  Lab Results:  Recent Labs  05/08/13 0338 05/09/13 0825  WBC 15.5*  --   HGB 7.2* 7.4*  HCT 23.1* 23.0*  PLT 180  --    BMET:  Recent Labs  05/08/13 0338  NA 135  K 4.2  CL 100  CO2 26  GLUCOSE 89  BUN 17  CREATININE 0.95  CALCIUM 9.2    PT/INR: No results found for this basename: LABPROT, INR,  in the last 72 hours ABG    Component Value Date/Time   PHART 7.389 05/06/2013 0425   HCO3 25.0* 05/06/2013 0425   TCO2 26 05/06/2013 1701   ACIDBASEDEF 1.0 05/05/2013 1922   O2SAT 98.0 05/06/2013 0425   CBG (last 3)   Recent Labs  05/07/13 1548 05/07/13 1958 05/07/13 2341  GLUCAP 103* 99 101*    Assessment/Plan: S/P Procedure(s) (LRB): CORONARY ARTERY BYPASS GRAFTING (CABG) TIMES FIVE USING LEFT INTERNAL MAMMARY ARTERY AND RIGHT AND  LEFT SAPHENOUS LEG VEIN HARVESTED ENDOSCOPICALLY (N/A)  1. CV- NSR rate and pressure controlled- on Amiodarone, Coreg, Lisinopril 2. Pulm- off oxygen, encouraged use of IS 3. Renal- remains volume overloaded, continue diuresis, BMET pending 4. Expected Acute Blood Loss Anemia- on Fe supplementation 5. DM- CBGs controlled 6. Gout- on home gout medications, will add Indocin 7. Dispo- patient is stable, needs to ambulate, adjusted gout medications- will plan to d/c once ambulating   LOS: 18 days    [name] [name], [name] [name] 05/10/2013  Gout is a little better today Able to walk some but had to use arms a lot- home when ambulating better

## 2013-05-10 NOTE — Progress Notes (Signed)
CARDIAC REHAB PHASE I   PRE:  Rate/Rhythm: 79 SR    BP: sitting 105/59    SaO2: 97 RA  MODE:  Ambulation: 150 ft   POST:  Rate/Rhythm: 103 ST    BP: sitting 109/60     SaO2: 98 RA  Pt ready to walk, gout pain some better. Able to stand assist x1 with gait belt. Used RW. Limped some but able to bear weight. Fatigued toward end, bearing more weight on arms. Wants to try next walk with ace bandage on foot. To recliner.  [id number]   [name] [name] [place] CES, ACSM 05/10/2013 10:47 AM

## 2013-05-10 NOTE — Progress Notes (Signed)
[id number] Pt educated by Cardiac Rehab regarding ex ed, sternal precautions, CRP 2. CHF booklet given due to low EF. Pt gave permission to refer to [facility] Phase 2.  Encouraged to watch sodium also. Jeani Sow, RN [name] 05/10/2013 3:28 PM

## 2013-05-10 NOTE — Progress Notes (Signed)
Pt ambulated 200 ft x1 assist using front wheel walker. Pt on RA with no complications or complaints of pain.

## 2013-05-10 NOTE — Progress Notes (Signed)
NUTRITION FOLLOW UP  Intervention:    Ensue Complete BID (350 kcals, 13 gm protein per 8 fl oz bottle) RD to follow for nutrition care plan  New Nutrition Dx:   Increased nutrient needs related to wound, post-op healing as evidenced by estimated nutrition needs, ongoing   Goal:   Pt to meet >/= 90% of their estimated nutrition needs, progressing  Monitor:   PO & supplemental intake, weight, labs, I/O's  Assessment:   PMHx significant for COPD, HTN, emphysema. Admitted with abdominal pain and fullness, n/v x 2 days. Work-up reveals ascites and transaminitis likely 2/2 "shock liver" from poor cardiac output and transient hypotension.  Patient s/p procedure 11/15: CORONARY ARTERY BYPASS GRAFTING x 5  Prior to surgery, patient required dental extractions -- RD consulted -- "Nutrition Therapy After Oral Surgery" handouts provided.  Currently on a Carbohydrate Modified Medium Calorie with variable PO intake of 25-100% per flowsheet records.  Ensure Complete ordered per RD prior to surgery -- will re-order.   Height: Ht Readings from Last 1 Encounters:  04/22/13 5' 7.5" (1.715 m)    Weight Status:   Wt Readings from Last 1 Encounters:  05/10/13 195 lb 12.3 oz (88.8 kg)    Re-estimated needs:  Kcal: 1800-2000 Protein: 80-90 gm Fluid: 1.8-2.0 L  Skin: partial thickness tissue injury to R foot   Diet Order: Carb Control   Intake/Output Summary (Last 24 hours) at 05/10/13 0824 Last data filed at 05/10/13 0558  Gross per 24 hour  Intake    600 ml  Output   1200 ml  Net   -600 ml    Labs:   Recent Labs Lab 05/05/13 1845  05/06/13 0428 05/06/13 1656 05/06/13 1701 05/07/13 0452 05/08/13 0338  NA  --   < > 137  --  134* 135 135  K  --   < > 4.2  --  3.6 4.4 4.2  CL  --   < > 104  --  105 100 100  CO2  --   --  23  --   --  26 26  BUN  --   < > 14  --  13 14 17   CREATININE 0.89  < > 0.95 0.93 1.10 0.97 0.95  CALCIUM  --   --  8.9  --   --  9.3 9.2  MG 3.1*  --   2.5 2.2  --   --   --   GLUCOSE  --   < > 124*  --  99 93 89  < > = values in this interval not displayed.  CBG (last 3)   Recent Labs  05/07/13 1548 05/07/13 1958 05/07/13 2341  GLUCAP 103* 99 101*    Scheduled Meds: . amiodarone  400 mg Oral BID  . antiseptic oral rinse  15 mL Mouth Rinse BID  . aspirin EC  325 mg Oral Daily  . carvedilol  3.125 mg Oral BID WC  . colchicine  0.6 mg Oral Daily  . docusate sodium  200 mg Oral Daily  . enoxaparin (LOVENOX) injection  40 mg Subcutaneous QHS  . famotidine  20 mg Oral BID  . ferrous gluconate  324 mg Oral BID WC  . furosemide  40 mg Oral Daily  . lisinopril  5 mg Oral Daily  . simvastatin  20 mg Oral q1800  . sodium chloride  3 mL Intravenous Q12H  . spironolactone  12.5 mg Oral Daily  . tiotropium  18 mcg Inhalation  Daily    Continuous Infusions:   Arthur Holms, RD, LDN Pager #: [phone] After-Hours Pager #: [phone]

## 2013-05-10 NOTE — Progress Notes (Signed)
PROGRESS NOTE  Subjective:   [name] [name] is doing well - s/p CABG x 5 ( LIMA-LAD,  SVG-D2, sequential SVG-[place] Int - OM2, SVG - PDA) He is doing well but has had a gout flare  Objective:    Vital Signs:   Temp:  [98.2 F (36.8 C)-98.5 F (36.9 C)] 98.5 F (36.9 C) (11/19 0556) Pulse Rate:  [70-85] 70 (11/19 0556) Resp:  [18-20] 18 (11/19 0556) BP: (97-106)/(55-67) 98/55 mmHg (11/19 0556) SpO2:  [96 %-98 %] 98 % (11/19 0556) Weight:  [195 lb 12.3 oz (88.8 kg)] 195 lb 12.3 oz (88.8 kg) (11/19 0556)  Last BM Date:  (prior to surgery)   24-hour weight change: Weight change: 14.1 oz (0.4 kg)  Weight trends: Filed Weights   05/08/13 0421 05/09/13 0528 05/10/13 0556  Weight: 195 lb 4.8 oz (88.587 kg) 194 lb 14.2 oz (88.4 kg) 195 lb 12.3 oz (88.8 kg)    Intake/Output:  11/18 0701 - 11/19 0700 In: 840 [P.O.:840] Out: 1200 [Urine:1200]     Physical Exam: BP 98/55  Pulse 70  Temp(Src) 98.5 F (36.9 C) (Oral)  Resp 18  Ht 5' 7.5" (1.715 m)  Wt 195 lb 12.3 oz (88.8 kg)  BMI 30.19 kg/m2  SpO2 98%  General: Vital signs reviewed and noted.   Head: Normocephalic, atraumatic.  Eyes: conjunctivae/corneas clear.  EOM's intact.   Throat: normal  Neck:  normal  Lungs:    clear  Heart:  RR, occasional premature  Abdomen:  Soft, non-tender, non-distended    Extremities: No edema   Neurologic: A&O X3, CN II - XII are grossly intact.   Psych: Normal     Labs: BMET:  Recent Labs  05/08/13 0338  NA 135  K 4.2  CL 100  CO2 26  GLUCOSE 89  BUN 17  CREATININE 0.95  CALCIUM 9.2    Liver function tests: No results found for this basename: AST, ALT, ALKPHOS, BILITOT, PROT, ALBUMIN,  in the last 72 hours No results found for this basename: LIPASE, AMYLASE,  in the last 72 hours  CBC:  Recent Labs  05/08/13 0338 05/09/13 0825  WBC 15.5*  --   HGB 7.2* 7.4*  HCT 23.1* 23.0*  MCV 78.8  --   PLT 180  --     Cardiac Enzymes: No results found for this basename:  CKTOTAL, CKMB, TROPONINI,  in the last 72 hours  Coagulation Studies: No results found for this basename: LABPROT, INR,  in the last 72 hours  Other: No components found with this basename: POCBNP,  No results found for this basename: DDIMER,  in the last 72 hours No results found for this basename: HGBA1C,  in the last 72 hours No results found for this basename: CHOL, HDL, LDLCALC, TRIG, CHOLHDL,  in the last 72 hours No results found for this basename: TSH, T4TOTAL, FREET3, T3FREE, THYROIDAB,  in the last 72 hours No results found for this basename: VITAMINB12, FOLATE, FERRITIN, TIBC, IRON, RETICCTPCT,  in the last 72 hours   Other results:  Tele:  NSR with PVCs.  Medications:    Infusions:    Scheduled Medications: . amiodarone  400 mg Oral BID  . antiseptic oral rinse  15 mL Mouth Rinse BID  . aspirin EC  325 mg Oral Daily  . carvedilol  3.125 mg Oral BID WC  . colchicine  0.6 mg Oral Daily  . docusate sodium  200 mg Oral Daily  . enoxaparin (LOVENOX)  injection  40 mg Subcutaneous QHS  . famotidine  20 mg Oral BID  . ferrous gluconate  324 mg Oral BID WC  . furosemide  40 mg Oral Daily  . lisinopril  5 mg Oral Daily  . simvastatin  20 mg Oral q1800  . sodium chloride  3 mL Intravenous Q12H  . spironolactone  12.5 mg Oral Daily  . tiotropium  18 mcg Inhalation Daily    Assessment/ Plan:      Coronary atherosclerosis of native coronary artery - s/p CABG, doing well, cont simva    Acute systolic heart failure- continue coreg,  Lasix, lisinopril.   Titrate meds up as tolerated.     Right foot ulcer   S/P CABG x 5   Disposition:  Length of Stay: 18  Thayer Headings, Brooke Bonito., MD, [facility] 05/10/2013, 7:22 AM Office [phone] Pager [phone]

## 2013-05-11 MED ORDER — ASPIRIN 325 MG PO TBEC: 325.0000 mg | DELAYED_RELEASE_TABLET | Freq: Every day | ORAL | Status: DC

## 2013-05-11 MED ORDER — SIMVASTATIN 20 MG PO TABS: 20.0000 mg | ORAL_TABLET | Freq: Every day | ORAL | Status: DC

## 2013-05-11 MED ORDER — SPIRONOLACTONE 12.5 MG HALF TABLET: 12.5000 mg | ORAL_TABLET | Freq: Every day | ORAL | Status: DC

## 2013-05-11 MED ORDER — LISINOPRIL 5 MG PO TABS: 5.0000 mg | ORAL_TABLET | Freq: Every day | ORAL | Status: DC

## 2013-05-11 MED ORDER — FUROSEMIDE 40 MG PO TABS
40.0000 mg | ORAL_TABLET | Freq: Two times a day (BID) | ORAL | Status: DC
  Filled 2013-05-11 (×2): qty 1

## 2013-05-11 MED ORDER — FUROSEMIDE 40 MG PO TABS: 40.0000 mg | ORAL_TABLET | Freq: Two times a day (BID) | ORAL | Status: DC

## 2013-05-11 MED ORDER — FERROUS GLUCONATE 324 (38 FE) MG PO TABS: 324.0000 mg | ORAL_TABLET | Freq: Two times a day (BID) | ORAL | Status: DC

## 2013-05-11 MED ORDER — AMIODARONE HCL 200 MG PO TABS: 200.0000 mg | ORAL_TABLET | Freq: Two times a day (BID) | ORAL | Status: DC

## 2013-05-11 MED ORDER — OXYCODONE HCL 5 MG PO TABS: 5.0000 mg | ORAL_TABLET | ORAL | Status: DC | PRN

## 2013-05-11 NOTE — Progress Notes (Addendum)
      [address]       [place] [address]             [phone]      6 Days Post-Op Procedure(s) (LRB): CORONARY ARTERY BYPASS GRAFTING (CABG) TIMES FIVE USING LEFT INTERNAL MAMMARY ARTERY AND RIGHT AND LEFT SAPHENOUS LEG VEIN HARVESTED ENDOSCOPICALLY (N/A)  Subjective:  Mr. Reasoner has no complaints this morning.  His gout pain has resolved and he is no longer experiencing pain with ambulation.  He is hoping to be discharged home today  Objective: Vital signs in last 24 hours: Temp:  [97.4 F (36.3 C)-97.9 F (36.6 C)] 97.4 F (36.3 C) (11/20 0449) Pulse Rate:  [72-82] 82 (11/20 0449) Cardiac Rhythm:  [-] Normal sinus rhythm (11/19 1938) Resp:  [18-20] 20 (11/20 0449) BP: (92-96)/(58-59) 95/59 mmHg (11/20 0449) SpO2:  [96 %-99 %] 98 % (11/20 0449) Weight:  [191 lb 8 oz (86.864 kg)] 191 lb 8 oz (86.864 kg) (11/20 0449)   Intake/Output from previous day: 11/19 0701 - 11/20 0700 In: 3060 [P.O.:3060] Out: 3300 [Urine:3300]  General appearance: alert, cooperative and no distress Heart: regular rate and rhythm Lungs: clear to auscultation bilaterally Abdomen: soft, non-tender; bowel sounds normal; no masses,  no organomegaly Extremities: edema trace Wound: clean and dry  Lab Results:  Recent Labs  05/09/13 0825  HGB 7.4*  HCT 23.0*   BMET:  Recent Labs  05/10/13 0910  NA 134*  K 4.5  CL 99  CO2 26  GLUCOSE 117*  BUN 18  CREATININE 1.17  CALCIUM 9.0    PT/INR: No results found for this basename: LABPROT, INR,  in the last 72 hours ABG    Component Value Date/Time   PHART 7.389 05/06/2013 0425   HCO3 25.0* 05/06/2013 0425   TCO2 26 05/06/2013 1701   ACIDBASEDEF 1.0 05/05/2013 1922   O2SAT 98.0 05/06/2013 0425   CBG (last 3)  No results found for this basename: GLUCAP,  in the last 72 hours  Assessment/Plan: S/P Procedure(s) (LRB): CORONARY ARTERY BYPASS GRAFTING (CABG) TIMES FIVE USING LEFT INTERNAL MAMMARY ARTERY AND RIGHT AND  LEFT SAPHENOUS LEG VEIN HARVESTED ENDOSCOPICALLY (N/A)  1. CV- NSR rate and pressure remained well controlled- on Amiodarone, Coreg, and Lisinopril 2. Pulm- no acute issues, continue IS 3. Renal- volume status continues to improve, continue diuresis 4. DM- CBGS controlled 5. Gout- improved, no further pain continue home gout medications 6. Dispo- patient is doing well today, no further gout pain, ambulating with RW, will taper Amiodarone down, will plan to discharge home today    LOS: 19 days    BARRETT, ERIN 05/11/2013  Patient seen and examined, agree with above Plan medical regimen per Dr. Aundra Dubin

## 2013-05-11 NOTE — Progress Notes (Signed)
CARDIAC REHAB PHASE I   PRE:  Rate/Rhythm: 79SR  BP:  Supine:   Sitting: 93/68  Standing:    SaO2: 97%RA  MODE:  Ambulation: 360 ft   POST:  Rate/Rhythm: 92SR  BP:  Supine:   Sitting:   Standing:    SaO2:   To bathroom quickly 0952-1015 Pt able to walk 360 ft with rolling walker and minimal asst. Had to cut walk short as pt had to use bathroom quickly. Stated he had already had 2 BMS. To bathroom and instructed to use call light. Told NT pt in bathroom. Brief review of ed prior to walk. Pt knows to weigh daily, watch sodium, walk and use IS.   Graylon Good, RN [name]  05/11/2013 10:11 AM

## 2013-05-11 NOTE — Progress Notes (Signed)
Removed CT sutures x2.  Applied benzoin and 1/2" steri strips.  Pt tolerated the procedure well. Pt given signs and symptoms of infection. Pt resting with call bell within reach.  Will continue to monitor. Payton Emerald

## 2013-05-11 NOTE — Progress Notes (Addendum)
Patient ID: [name] [name], male   DOB: 1951/05/20, 62 y.o.   MRN: [id number]    SUBJECTIVE: Doing well, no complaints.    Cath with severe 3V CAD. RHC as below.  Patient underwent CABG.   Ao: 105/59  LV: 110/10/19  RA: 13  RV: 42/5//13  PA: 47/15 (mean 31)  PCWP: 19  Fick Cardiac Output: 10 L/min  Fick Cardiac Index: 4.86 L/min/m2  PVR: 1.2 WU Central Aortic Saturation: 87%  Pulmonary Artery Saturation: 68%  Medications   . allopurinol  300 mg Oral Daily  . amiodarone  400 mg Oral BID  . antiseptic oral rinse  15 mL Mouth Rinse BID  . aspirin EC  325 mg Oral Daily  . carvedilol  3.125 mg Oral BID WC  . colchicine  0.6 mg Oral Daily  . docusate sodium  200 mg Oral Daily  . enoxaparin (LOVENOX) injection  40 mg Subcutaneous QHS  . famotidine  20 mg Oral BID  . feeding supplement (ENSURE COMPLETE)  237 mL Oral BID BM  . ferrous gluconate  324 mg Oral BID WC  . furosemide  40 mg Oral BID  . indomethacin  25 mg Oral BID WC  . lisinopril  5 mg Oral Daily  . simvastatin  20 mg Oral q1800  . sodium chloride  3 mL Intravenous Q12H  . spironolactone  12.5 mg Oral Daily  . tiotropium  18 mcg Inhalation Daily     Filed Vitals:   05/10/13 1259 05/10/13 2041 05/11/13 0449 05/11/13 0700  BP: 92/59 96/58 95/59  98/74  Pulse: 72 78 82 77  Temp: 97.9 F (36.6 C) 97.9 F (36.6 C) 97.4 F (36.3 C) 98.4 F (36.9 C)  TempSrc: Oral Oral Oral Oral  Resp: 19 18 20 16   Height:      Weight:   191 lb 8 oz (86.864 kg)   SpO2: 96% 96% 98% 98%    Intake/Output Summary (Last 24 hours) at 05/11/13 1335 Last data filed at 05/11/13 0800  Gross per 24 hour  Intake   2940 ml  Output   2200 ml  Net    740 ml    LABS: Basic Metabolic Panel:  Recent Labs  05/10/13 0910  NA 134*  K 4.5  CL 99  CO2 26  GLUCOSE 117*  BUN 18  CREATININE 1.17  CALCIUM 9.0   Liver Function Tests: No results found for this basename: AST, ALT, ALKPHOS, BILITOT, PROT, ALBUMIN,  in the last 72 hours No  results found for this basename: LIPASE, AMYLASE,  in the last 72 hours CBC:  Recent Labs  05/09/13 0825  HGB 7.4*  HCT 23.0*    RADIOLOGY: Ct Abdomen Pelvis Wo Contrast  04/22/2013   CLINICAL DATA:  Abdominal pain and distention.  EXAM: CT ABDOMEN AND PELVIS WITHOUT CONTRAST  TECHNIQUE: Multidetector CT imaging of the abdomen and pelvis was performed following the standard protocol without intravenous contrast.  COMPARISON:  04/21/2013  FINDINGS: Mild ascites again seen within the abdomen and pelvis, without significant change. The liver, gallbladder, spleen, pancreas, adrenal glands, and kidneys are unremarkable in appearance except for small right renal cyst. Vicarious excretion of contrast noted in the gallbladder as well as mild residual contrast enhancement of both kidneys from the most recent CT. No soft tissue masses or lymphadenopathy identified within the abdomen or pelvis. Residual contrast seen in the urinary bladder.  Diverticulosis is seen mainly involving the sigmoid colon, however there is no evidence of diverticulitis. No  focal inflammatory process or abscess identified. No evidence of bowel obstruction or hernia.  IMPRESSION: Mild ascites, without significant change since prior exam.  Diverticulosis. No radiographic evidence of diverticulitis.   Electronically Signed   By: Earle Gell M.D.   On: 04/22/2013 20:39   Dg Chest 2 View  04/22/2013   CLINICAL DATA:  Chest pain and vomiting  EXAM: CHEST  2 VIEW  COMPARISON:  04/21/2013  FINDINGS: The heart is again enlarged in size but stable. The lungs are well aerated bilaterally. Some very mild patchy changes are noted in the right upper lobe which may represent some early infiltrate.  IMPRESSION: Stable cardiomegaly.  New patchy changes in the right upper lobe which may represent an early infiltrate.   Electronically Signed   By: Inez Catalina M.D.   On: 04/22/2013 14:51   US Abdomen Complete  04/22/2013   CLINICAL DATA:  Abdominal  pain.  EXAM: ULTRASOUND ABDOMEN COMPLETE  COMPARISON:  None.  FINDINGS: Gallbladder  No gallstones identified. Mild diffuse gallbladder wall thickening seen measuring 4 mm, however no sonographic Murphy sign was noted.  Common bile duct  Diameter: 3 mm  Liver  No focal lesion identified. Within normal limits in parenchymal echogenicity. Mild perihepatic ascites noted.  IVC  No abnormality visualized.  Pancreas  Visualized portion unremarkable.  Spleen  Size and appearance within normal limits.  Right Kidney  Length: 10.3 cm. Echogenicity within normal limits. No mass or hydronephrosis visualized.  Left Kidney  Length: 10.4 cm. Echogenicity within normal limits. No mass or hydronephrosis visualized.  Abdominal aorta  No aneurysm visualized.  IMPRESSION: No evidence of gallstones or biliary dilatation.  Mild diffuse gallbladder wall thickening, which is a nonspecific finding. Mild ascites also noted.  Unremarkable sonographic appearance of the liver.   Electronically Signed   By: Earle Gell M.D.   On: 04/22/2013 16:46   Dg Chest Port 1 View  04/23/2013   CLINICAL DATA:  62 year old male with reason chest pain and vomiting. Fluid status.  EXAM: PORTABLE CHEST - 1 VIEW  COMPARISON:  04/22/2013 and earlier.  FINDINGS: Portable AP semi upright view at 2056 hrs. Stable cardiomegaly and mediastinal contours. Previously seen subtle patchy right lung opacity does not persist. No pulmonary edema, pleural effusion, pneumothorax, or consolidation. Stable visualized osseous structures.  IMPRESSION: Interval resolved nonspecific patchy pulmonary opacity. Stable cardiomegaly and no acute cardiopulmonary abnormality.   Electronically Signed   By: Lars Pinks M.D.   On: 04/23/2013 21:09    PHYSICAL EXAM General: NAD HEENT: normal except for poor dentition Neck: JVP 10 cm, no thyromegaly or thyroid nodule.  Lungs: Decreased breath sounds at bases. CV: Nondisplaced PMI.  Heart regular S1/S2, no S3/S4, 1/6 HSM LLSB.  No edema.   No carotid bruit.   Abdomen: Soft, nontender, no hepatosplenomegaly mild distention.  Neurologic: A&O x 3 Psych: Normal affect. Extremities: No clubbing or cyanosis.   TELEMETRY: Reviewed telemetry pt in NSR  ASSESSMENT AND PLAN: 62 yo with history of COPD, diastolic CHF, and HTN presented with abdominal distention and elevated LFTs in setting of ADHF  Echo showed EF 20-25% with moderate RV dysfunction. 1. Elevated LFTs:  Much improved. Likely due to congestion from HF as well as shock liver. May have component of fatty liver. Hepatitis panels negative.  2. Acute on chronic systolic CHF: EF 0000000 on echo with moderate RV dysfunction 3. AKI on probable CKD.  Resolved, creatinine back to normal range.  4. 3V CAD: s/p CABG.  Stable post-CABG.  He does look volume overloaded today.  Would continue current lisinopril, Coreg, and spironolactone.  No BP room to titrate.  He is still volume overloaded.  Would increase Lasix to 40 mg po bid.  He can go home on this.  I will arrange followup with me in CHF clinic.  Can decrease amiodarone when he goes home.   Loralie Champagne 05/11/2013 1:35 PM

## 2013-05-19 ENCOUNTER — Other Ambulatory Visit: Payer: Self-pay | Admitting: Nurse Practitioner

## 2013-05-19 ENCOUNTER — Emergency Department (HOSPITAL_COMMUNITY): Payer: Medicaid Other

## 2013-05-19 ENCOUNTER — Emergency Department (HOSPITAL_COMMUNITY)
Admission: EM | Admit: 2013-05-19 | Discharge: 2013-05-19 | Disposition: A | Discharge status: Home / Self Care | Payer: Medicaid Other | Attending: Emergency Medicine | Admitting: Emergency Medicine

## 2013-05-19 ENCOUNTER — Telehealth: Payer: Self-pay | Admitting: Physician Assistant

## 2013-05-19 ENCOUNTER — Encounter (HOSPITAL_COMMUNITY): Payer: Self-pay | Admitting: Emergency Medicine

## 2013-05-19 DIAGNOSIS — I5022 Chronic systolic (congestive) heart failure: Secondary | ICD-10-CM | POA: Insufficient documentation

## 2013-05-19 DIAGNOSIS — I10 Essential (primary) hypertension: Secondary | ICD-10-CM | POA: Insufficient documentation

## 2013-05-19 DIAGNOSIS — Z951 Presence of aortocoronary bypass graft: Secondary | ICD-10-CM | POA: Insufficient documentation

## 2013-05-19 DIAGNOSIS — J449 Chronic obstructive pulmonary disease, unspecified: Secondary | ICD-10-CM | POA: Insufficient documentation

## 2013-05-19 DIAGNOSIS — R0789 Other chest pain: Secondary | ICD-10-CM

## 2013-05-19 DIAGNOSIS — Z880 Allergy status to penicillin: Secondary | ICD-10-CM | POA: Insufficient documentation

## 2013-05-19 DIAGNOSIS — F17200 Nicotine dependence, unspecified, uncomplicated: Secondary | ICD-10-CM | POA: Insufficient documentation

## 2013-05-19 DIAGNOSIS — E119 Type 2 diabetes mellitus without complications: Secondary | ICD-10-CM | POA: Insufficient documentation

## 2013-05-19 DIAGNOSIS — G8918 Other acute postprocedural pain: Secondary | ICD-10-CM | POA: Insufficient documentation

## 2013-05-19 DIAGNOSIS — I2510 Atherosclerotic heart disease of native coronary artery without angina pectoris: Secondary | ICD-10-CM | POA: Insufficient documentation

## 2013-05-19 DIAGNOSIS — Z79899 Other long term (current) drug therapy: Secondary | ICD-10-CM | POA: Insufficient documentation

## 2013-05-19 DIAGNOSIS — Z7982 Long term (current) use of aspirin: Secondary | ICD-10-CM | POA: Insufficient documentation

## 2013-05-19 DIAGNOSIS — J4489 Other specified chronic obstructive pulmonary disease: Secondary | ICD-10-CM | POA: Insufficient documentation

## 2013-05-19 DIAGNOSIS — R072 Precordial pain: Secondary | ICD-10-CM | POA: Insufficient documentation

## 2013-05-19 DIAGNOSIS — M109 Gout, unspecified: Secondary | ICD-10-CM | POA: Insufficient documentation

## 2013-05-19 DIAGNOSIS — E785 Hyperlipidemia, unspecified: Secondary | ICD-10-CM | POA: Insufficient documentation

## 2013-05-19 HISTORY — DX: Nonspecific elevation of levels of transaminase and lactic acid dehydrogenase (ldh): R74.0

## 2013-05-19 HISTORY — DX: Chronic systolic (congestive) heart failure: I50.22

## 2013-05-19 HISTORY — DX: Elevation of levels of liver transaminase levels: R74.01

## 2013-05-19 HISTORY — DX: Atherosclerotic heart disease of native coronary artery without angina pectoris: I25.10

## 2013-05-19 LAB — BASIC METABOLIC PANEL
BUN: 15 mg/dL (ref 6–23)
Calcium: 9.3 mg/dL (ref 8.4–10.5)
Chloride: 99 mEq/L (ref 96–112)
Creatinine, Ser: 1.01 mg/dL (ref 0.50–1.35)
GFR calc non Af Amer: 78 mL/min — ABNORMAL LOW (ref >90)
Glucose, Bld: 96 mg/dL (ref 70–99)
Sodium: 134 mEq/L — ABNORMAL LOW (ref 135–145)

## 2013-05-19 LAB — CBC WITH DIFFERENTIAL/PLATELET
Eosinophils Relative: 7 % — ABNORMAL HIGH (ref 0–5)
Lymphocytes Relative: 17 % (ref 12–46)
Lymphs Abs: 1.8 10*3/uL (ref 0.7–4.0)
MCH: 25.5 pg — ABNORMAL LOW (ref 26.0–34.0)
MCV: 81.5 fL (ref 78.0–100.0)
Monocytes Absolute: 0.8 10*3/uL (ref 0.1–1.0)
Monocytes Relative: 8 % (ref 3–12)
Neutro Abs: 6.9 10*3/uL (ref 1.7–7.7)
Platelets: 429 10*3/uL — ABNORMAL HIGH (ref 150–400)
RBC: 3.68 MIL/uL — ABNORMAL LOW (ref 4.22–5.81)

## 2013-05-19 MED ORDER — POTASSIUM CHLORIDE CRYS ER 20 MEQ PO TBCR: 20.0000 meq | EXTENDED_RELEASE_TABLET | Freq: Every day | ORAL | Status: DC

## 2013-05-19 MED ORDER — OXYCODONE HCL 5 MG PO TABS: 5.0000 mg | ORAL_TABLET | ORAL | Status: DC | PRN

## 2013-05-19 MED ORDER — ASPIRIN 81 MG PO CHEW: 162.0000 mg | CHEWABLE_TABLET | Freq: Once | ORAL | Status: DC

## 2013-05-19 NOTE — ED Provider Notes (Signed)
Medical screening examination/treatment/procedure(s) were performed by non-physician practitioner and as supervising physician I was immediately available for consultation/collaboration.  EKG Interpretation    Date/Time:  Friday May 19 2013 11:39:16 EST Ventricular Rate:  84 PR Interval:  188 QRS Duration: 139 QT Interval:  459 QTC Calculation: 543 R Axis:   -65 Text Interpretation:  Sinus rhythm Left atrial enlargement Right bundle branch block Inferior infarct, old Probable posterior infarct, acute Lateral leads are also involved Unchanged compared to ECG on 05/06/13 Confirmed by Kyser Wandel  DO, Doristine Shehan ([id number]) on 05/19/2013 11:43:21 AM              [place], DO 05/19/13 1720

## 2013-05-19 NOTE — Consult Note (Addendum)
Patient ID: [name] [name]: [id number], DOB/AGE: [birth year]   Admit date: 05/19/2013  Primary Physician: Fae Pippin Primary Cardiologist: Einar Crow, MD - CHF clinic Thoracic Surgeon: Chauncey Cruel. Roxan Hockey, MD  Pt. Profile:  62 y/o male with recent h/o CAD and CHF s/p CABG x 5 on 05/05/2013, who presented to the ED today with chest pain.  Problem List  Past Medical History  Diagnosis Date  . COPD (chronic obstructive pulmonary disease)   . Gout   . Emphysema   . Hypertension   . Hyperlipidemia   . Tobacco abuse     30+ pack-year history  . Diabetes mellitus without complication   . Chronic systolic CHF (congestive heart failure)     a. 04/2013 Echo: EF 15-20%, diff HK, sev HK of inf and ant myocardium, dilated LA, improved EF post cardiopulmonary bypass.  Marland Kitchen CAD (coronary artery disease)     a. 04/2013 Cath: LM 10, LAD 40p, 11m, D1 50p, LCX 60p, 52m/100m, OM1 50, OM2 100 L-L collats, RCA 100p;  b. CABG x 5: LIMA->LAD, VG->D2, VG->RI->OM2, VG->PDA.  . Transaminitis     a. 04/2013 Abd U/S and CT unremarkable, hepatitis panel neg -->felt to be 2/2 acute R heart failure.    Past Surgical History  Procedure Laterality Date  . Appendectomy  as a child  . Multiple extractions with alveoloplasty N/A 05/03/2013    Procedure: Extraction of tooth #s 3,4,5,6,8,9,27 with alveoloplast and maxillary left osseous tuberosity reduction;  Surgeon: Lenn Cal, DDS;  Location: [place];  Service: Oral Surgery;  Laterality: N/A;  . Coronary artery bypass graft N/A 05/05/2013    Procedure: CORONARY ARTERY BYPASS GRAFTING (CABG) TIMES FIVE USING LEFT INTERNAL MAMMARY ARTERY AND RIGHT AND LEFT SAPHENOUS LEG VEIN HARVESTED ENDOSCOPICALLY;  Surgeon: Melrose Nakayama, MD;  Location: [place];  Service: Open Heart Surgery;  Laterality: N/A;    Allergies  Allergies  Allergen Reactions  . Penicillins Other (See Comments)    Swelling , long time ago.    HPI  62 y/o male with the above  complex problem list. He was admitted to [facility] on 11/1 with abdominal bloating/distension and pain and was found to have markedly elevated LFT's and acute kidney injury.  Abd u/s and CT were unrevealing and hepatitis panel was negative.  He was initially hydrated but developed dyspnea and an echocardiogram showed an EF of 20-25% on 11/4.  Cardiology was consulted and the patient was aggressively diuresed.  Though at first it was felt that transaminitis was out of proportion to R heart failure, with diuresis, his lft's and inr began to normalize.  With new finding of cardiomyopathy, he underwent diagnostic cath, revealing severe LCX and RCA disease.  Surgery was consulted and on 11/14, he underwent CABG x 5.  Post-op, he required additional diuresis for ongoing volume overload but was finally discharged on 11/20.  Since d/c, his weight has been steady @ 185 lbs on his home scale.  He has had chest wall pain over the course of the past week, for which he has been taking prn oxycodone.  Home health nursing has been coming out to his house and overall, he feels that he has been doing well.  Today, his chest wall pain worsened and remained unrelieved.  Pain is worse with palpation, deep breathing, and certain position changes.  As a result, he presented to the ED.  Here, troponin is nl and ecg is non-acute.  He deniespalpitations, dyspnea, pnd, orthopnea, n, v, dizziness,  syncope, edema, weight gain, or early satiety.  Home Medications  Prior to Admission medications   Medication Sig Start Date End Date Taking? Authorizing Provider  albuterol (PROVENTIL HFA;VENTOLIN HFA) 108 (90 BASE) MCG/ACT inhaler Inhale 2 puffs into the lungs every 4 (four) hours as needed. For shortness of breath    Yes Historical Provider, MD  allopurinol (ZYLOPRIM) 300 MG tablet Take 300 mg by mouth daily.   Yes Historical Provider, MD  amiodarone (PACERONE) 200 MG tablet Take 1 tablet (200 mg total) by mouth 2 (two) times daily. For 7  days, then decrease to 200 mg once a day 05/11/13  Yes Erin Barrett, PA-C  aspirin EC 325 MG EC tablet Take 1 tablet (325 mg total) by mouth daily. 05/11/13  Yes Erin Barrett, PA-C  b complex vitamins tablet Take 1 tablet by mouth daily.   Yes Historical Provider, MD  carvedilol (COREG) 3.125 MG tablet Take 3.125 mg by mouth 2 (two) times daily with a meal.   Yes Historical Provider, MD  colchicine 0.6 MG tablet Take 0.6-1.2 mg by mouth daily. Take 2 tablets at the onset of gout symptoms, then take 1 tablet in an hour for a max of 3 tablets per 24 hours   Yes Historical Provider, MD  ferrous gluconate (FERGON) 324 MG tablet Take 1 tablet (324 mg total) by mouth 2 (two) times daily with a meal. 05/11/13  Yes Erin Barrett, PA-C  furosemide (LASIX) 40 MG tablet Take 1 tablet (40 mg total) by mouth 2 (two) times daily. 05/11/13  Yes Amy D Clegg, NP  lisinopril (PRINIVIL,ZESTRIL) 5 MG tablet Take 1 tablet (5 mg total) by mouth daily. 05/11/13  Yes Erin Barrett, PA-C  lubiprostone (AMITIZA) 24 MCG capsule Take 24 mcg by mouth 2 (two) times daily with a meal.   Yes Historical Provider, MD  omeprazole (PRILOSEC) 20 MG capsule Take 20 mg by mouth daily.   Yes Historical Provider, MD  ondansetron (ZOFRAN) 4 MG tablet Take 4 mg by mouth every 6 (six) hours as needed for nausea or vomiting.   Yes Historical Provider, MD  oxyCODONE (OXY IR/ROXICODONE) 5 MG immediate release tablet Take 1-2 tablets (5-10 mg total) by mouth every 3 (three) hours as needed for severe pain. 05/11/13  Yes Erin Barrett, PA-C  simvastatin (ZOCOR) 20 MG tablet Take 1 tablet (20 mg total) by mouth daily at 6 PM. 05/11/13  Yes Erin Barrett, PA-C  spironolactone (ALDACTONE) 25 MG tablet Take 12.5 mg by mouth daily.   Yes Historical Provider, MD  tiotropium (SPIRIVA) 18 MCG inhalation capsule Place 18 mcg into inhaler and inhale daily.   Yes Historical Provider, MD   Family History  Family History  Problem Relation Age of Onset  .  Diabetes Mother   . Hypertension    . Heart attack      Brothers   Social History  History   Social History  . Marital Status: Significant Other    Spouse Name: N/A    Number of Children: N/A  . Years of Education: N/A   Occupational History  . Retired    Social History Main Topics  . Smoking status: Current Every Day Smoker -- 1.00 packs/day for 30 years    Last Attempt to Quit: 05/11/2011  . Smokeless tobacco: Not on file     Comment: has had a few cigarettes since discharge after CABG.  Marland Kitchen Alcohol Use: No  . Drug Use: No  . Sexual Activity: Not on file  Other Topics Concern  . Not on file   Social History Narrative   Lives in [place] with his girlfriend and her daughter.      Review of Systems General:  No chills, fever, night sweats or weight changes.  Cardiovascular:  +++ chest wall pain, no dyspnea on exertion, edema, orthopnea, palpitations, paroxysmal nocturnal dyspnea. Dermatological: No rash, lesions/masses Respiratory: No cough, dyspnea Urologic: No hematuria, dysuria Abdominal:   No nausea, vomiting, diarrhea, bright red blood per rectum, melena, or hematemesis Neurologic:  No visual changes, wkns, changes in mental status. All other systems reviewed and are otherwise negative except as noted above.  Physical Exam  Blood pressure 113/55, pulse 84, temperature 97.7 F (36.5 C), temperature source Oral, resp. rate 24, SpO2 100.00%.  General: Pleasant, NAD Psych: Normal affect. Neuro: Alert and oriented X 3. Moves all extremities spontaneously. HEENT: Normal  Neck: Supple without bruits, jvp ~ 10cm Lungs:  Resp regular and unlabored, diminished @ bases, otw cta. Heart: RRR no s3, s4, or murmurs. Abdomen: Soft, non-tender, non-distended, BS + x 4.  Extremities: No clubbing, cyanosis, trace bilat LE edema right slightly more so that left. DP/PT/Radials 2+ and equal bilaterally.  Labs   Recent Labs  05/19/13 1205  TROPONINI <0.30   Lab Results    Component Value Date   WBC 10.3 05/19/2013   HGB 9.4* 05/19/2013   HCT 30.0* 05/19/2013   MCV 81.5 05/19/2013   PLT 429* 05/19/2013     Recent Labs Lab 05/19/13 1204  NA 134*  K 3.2*  CL 99  CO2 26  BUN 15  CREATININE 1.01  CALCIUM 9.3  GLUCOSE 96   Lab Results  Component Value Date   CHOL 98 05/01/2013   HDL 31* 05/01/2013   LDLCALC 53 05/01/2013   TRIG 71 05/01/2013   Radiology/Studies  Dg Chest 2 View  05/19/2013   CLINICAL DATA:  Chest pain.  EXAM: CHEST  2 VIEW  COMPARISON:  Multiple priors  FINDINGS: The cardiomediastinal silhouette is unchanged and enlarged. Patient is status post median sternotomy. No edema. Mild bibasilar opacities are seen, likely atelectasis. No pleural effusion or pneumothorax. No acute osseous abnormality.  IMPRESSION: 1. Unchanged enlargement of the cardiac silhouette. 2. Mild bibasilar opacities, likely atelectasis.   Electronically Signed   By: Donavan Burnet M.D.   On: 05/19/2013 12:29   ECG  Rsr, 90, rbbb, lad, inf infarct.  ASSESSMENT AND PLAN  1.  Chest wall pain:  Likely related to recent surgery.  Pain worse with palpation, deep breathing, position changes.  No evidence of instability on exam.  CXR non-acute.  ECG non-acute.  Troponin normal despite prolonged Ss.  MSK chest pain.  Rec refill of prn narcotic if he needs it.  No further inpatient cardiac w/u.  2.  CAD:  S/p CABG.  As above, there is no obj evidence of ischemia.  Cont home meds post-cabg.  Ok to d/c from ED.  3.  ICM/Chronic systolic CHF:  Volume looks good.  He has been weighing himself daily.  Cont bb, acei, spironolactone.  4.  Tob Abuse:  Cessation advised.  Signed, Murray Hodgkins, NP 05/19/2013, 2:15 PM As above, patient seen and examined. Briefly he is a 62 year old male with COPD, hypertension, hyperlipidemia, coronary artery disease status post recent coronary artery bypassing graft for evaluation of chest pain. Patient admitted with congestive  heart failure earlier this month. Cardiac catheterization revealed severe three-vessel coronary artery disease. He underwent coronary artery bypass and graft and  was ultimately discharged. He returns today with complaints of left-sided chest pain. It increases with cough and movement. He otherwise has not had chest pain. There is some reproduction with palpation. Electrocautery shows sinus rhythm at a rate of 84. Right bundle branch block, prior inferior infarct, nonspecific ST changes. Enzymes negative x1. Chest pain is most consistent with musculoskeletal pain. Patient can be discharged from a cardiac standpoint. Would continue preadmission cardiac medications. Would make sure that he has pain medications following recent surgery. He should followup with Dr. Aundra Dubin and Dr Roxan Hockey as scheduled. Will need echocardiogram in 3 months to reassess LV function. Ejection fraction less than 35% he would need an ICD. Kirk Ruths

## 2013-05-19 NOTE — Telephone Encounter (Signed)
[name] [name], [facility] for [name] [name] contacted [facility] pharmacy to inquire as to why he was not on potassium any longer.  He was just seen in ED and K+ was 3.2.  After discussion, with Dr. Stanford Breed, pharmacy paged and requested that I address K and arrange for bmet next week.  I contacted [name] [name] and advised that I was sending a Rx for kdur 9meq 1 po daily, #30 with 6 refills to [name] pharmacy and that he will need a bmet in 1 week.  She will draw the bmet at his home and forward it to our office.

## 2013-05-19 NOTE — ED Provider Notes (Signed)
CSN: [id number]     Arrival date & time 05/19/13  1122 History   First MD Initiated Contact with Patient 05/19/13 1124     Chief Complaint  Patient presents with  . Chest Pain   (Consider location/radiation/quality/duration/timing/severity/associated sxs/prior Treatment) Patient is a 62 y.o. male presenting with chest pain. The history is provided by the patient.  Chest Pain Pain location:  Substernal area Pain quality: sharp   Pain radiates to:  Does not radiate Pain radiates to the back: no   Pain severity:  No pain Onset quality:  Gradual Duration:  3 hours Timing:  Intermittent Progression:  Improving Chronicity:  Recurrent Relieved by:  Rest Worsened by:  Deep breathing Ineffective treatments:  None tried Associated symptoms: no abdominal pain, no back pain, no diaphoresis, no dizziness, no fatigue, no fever, no lower extremity edema, no orthopnea and no weakness   Risk factors: coronary artery disease, diabetes mellitus, high cholesterol, hypertension, immobilization, male sex and surgery     Cardiology: [place] PCP: Family Medicine  Locklan Reilley is a 62 y.o.male with a significant PMH of s/p CABG x 5 ( LIMA-LAD, SVG-D2, sequential SVG-[place] Int - OM2, SVG - PDA), COPD, gout, CHF presents to the ER with complaints of chest pain that started 3 hours ago. On April 18, 2013.     Past Medical History  Diagnosis Date  . COPD (chronic obstructive pulmonary disease)   . Gout   . Emphysema   . Hypertension   . Hyperlipidemia   . Tobacco abuse     30+ pack-year history  . Diabetes mellitus without complication   . Chronic systolic CHF (congestive heart failure)     a. 04/2013 Echo: EF 15-20%, diff HK, sev HK of inf and ant myocardium, dilated LA, improved EF post cardiopulmonary bypass.  Marland Kitchen CAD (coronary artery disease)     a. 04/2013 Cath: LM 10, LAD 40p, 31m, D1 50p, LCX 60p, 41m/100m, OM1 50, OM2 100 L-L collats, RCA 100p;  b. CABG x 5: LIMA->LAD, VG->D2, VG->RI->OM2,  VG->PDA.  . Transaminitis     a. 04/2013 Abd U/S and CT unremarkable, hepatitis panel neg -->felt to be 2/2 acute R heart failure.   Past Surgical History  Procedure Laterality Date  . Appendectomy  as a child  . Multiple extractions with alveoloplasty N/A 05/03/2013    Procedure: Extraction of tooth #s 3,4,5,6,8,9,27 with alveoloplast and maxillary left osseous tuberosity reduction;  Surgeon: Lenn Cal, DDS;  Location: [place];  Service: Oral Surgery;  Laterality: N/A;  . Coronary artery bypass graft N/A 05/05/2013    Procedure: CORONARY ARTERY BYPASS GRAFTING (CABG) TIMES FIVE USING LEFT INTERNAL MAMMARY ARTERY AND RIGHT AND LEFT SAPHENOUS LEG VEIN HARVESTED ENDOSCOPICALLY;  Surgeon: Melrose Nakayama, MD;  Location: [place];  Service: Open Heart Surgery;  Laterality: N/A;   Family History  Problem Relation Age of Onset  . Diabetes Mother   . Hypertension    . Heart attack      Brothers   History  Substance Use Topics  . Smoking status: Current Every Day Smoker -- 1.00 packs/day for 30 years    Last Attempt to Quit: 05/11/2011  . Smokeless tobacco: Not on file     Comment: has had a few cigarettes since discharge after CABG.  Marland Kitchen Alcohol Use: No    Review of Systems  Constitutional: Negative for fever, diaphoresis and fatigue.  Cardiovascular: Positive for chest pain. Negative for orthopnea.  Gastrointestinal: Negative for abdominal pain.  Musculoskeletal:  Negative for back pain.  Neurological: Negative for dizziness and weakness.    Allergies  Penicillins  Home Medications   Current Outpatient Rx  Name  Route  Sig  Dispense  Refill  . albuterol (PROVENTIL HFA;VENTOLIN HFA) 108 (90 BASE) MCG/ACT inhaler   Inhalation   Inhale 2 puffs into the lungs every 4 (four) hours as needed. For shortness of breath          . allopurinol (ZYLOPRIM) 300 MG tablet   Oral   Take 300 mg by mouth daily.         Marland Kitchen amiodarone (PACERONE) 200 MG tablet   Oral   Take 1 tablet  (200 mg total) by mouth 2 (two) times daily. For 7 days, then decrease to 200 mg once a day   60 tablet   1   . aspirin EC 325 MG EC tablet   Oral   Take 1 tablet (325 mg total) by mouth daily.   30 tablet   0   . b complex vitamins tablet   Oral   Take 1 tablet by mouth daily.         . carvedilol (COREG) 3.125 MG tablet   Oral   Take 3.125 mg by mouth 2 (two) times daily with a meal.         . colchicine 0.6 MG tablet   Oral   Take 0.6-1.2 mg by mouth daily. Take 2 tablets at the onset of gout symptoms, then take 1 tablet in an hour for a max of 3 tablets per 24 hours         . ferrous gluconate (FERGON) 324 MG tablet   Oral   Take 1 tablet (324 mg total) by mouth 2 (two) times daily with a meal.   60 tablet   3   . furosemide (LASIX) 40 MG tablet   Oral   Take 1 tablet (40 mg total) by mouth 2 (two) times daily.   60 tablet   6   . lisinopril (PRINIVIL,ZESTRIL) 5 MG tablet   Oral   Take 1 tablet (5 mg total) by mouth daily.   30 tablet   3   . lubiprostone (AMITIZA) 24 MCG capsule   Oral   Take 24 mcg by mouth 2 (two) times daily with a meal.         . omeprazole (PRILOSEC) 20 MG capsule   Oral   Take 20 mg by mouth daily.         . ondansetron (ZOFRAN) 4 MG tablet   Oral   Take 4 mg by mouth every 6 (six) hours as needed for nausea or vomiting.         . simvastatin (ZOCOR) 20 MG tablet   Oral   Take 1 tablet (20 mg total) by mouth daily at 6 PM.   30 tablet   3   . spironolactone (ALDACTONE) 25 MG tablet   Oral   Take 12.5 mg by mouth daily.         Marland Kitchen tiotropium (SPIRIVA) 18 MCG inhalation capsule   Inhalation   Place 18 mcg into inhaler and inhale daily.         Marland Kitchen oxyCODONE (OXY IR/ROXICODONE) 5 MG immediate release tablet   Oral   Take 1-2 tablets (5-10 mg total) by mouth every 3 (three) hours as needed for severe pain.   30 tablet   0    BP 118/67  Pulse 82  Temp(Src) 97.7  F (36.5 C) (Oral)  Resp 20  SpO2  100% Physical Exam  Nursing note and vitals reviewed. Constitutional: He appears well-developed and well-nourished. No distress.  HENT:  Head: Normocephalic and atraumatic.  Eyes: Pupils are equal, round, and reactive to light.  Neck: Normal range of motion. Neck supple.  Cardiovascular: Normal rate and regular rhythm.   Pulmonary/Chest: Effort normal.    Abdominal: Soft.  Neurological: He is alert.  Skin: Skin is warm and dry.    ED Course  Procedures (including critical care time) Labs Review Labs Reviewed  CBC WITH DIFFERENTIAL - Abnormal; Notable for the following:    RBC 3.68 (*)    Hemoglobin 9.4 (*)    HCT 30.0 (*)    MCH 25.5 (*)    RDW 23.0 (*)    Platelets 429 (*)    Eosinophils Relative 7 (*)    All other components within normal limits  BASIC METABOLIC PANEL - Abnormal; Notable for the following:    Sodium 134 (*)    Potassium 3.2 (*)    GFR calc non Af Amer 78 (*)    All other components within normal limits  TROPONIN I   Imaging Review Dg Chest 2 View  05/19/2013   CLINICAL DATA:  Chest pain.  EXAM: CHEST  2 VIEW  COMPARISON:  Multiple priors  FINDINGS: The cardiomediastinal silhouette is unchanged and enlarged. Patient is status post median sternotomy. No edema. Mild bibasilar opacities are seen, likely atelectasis. No pleural effusion or pneumothorax. No acute osseous abnormality.  IMPRESSION: 1. Unchanged enlargement of the cardiac silhouette. 2. Mild bibasilar opacities, likely atelectasis.   Electronically Signed   By: Donavan Burnet M.D.   On: 05/19/2013 12:29    EKG Interpretation    Date/Time:  Friday May 19 2013 11:39:16 EST Ventricular Rate:  84 PR Interval:  188 QRS Duration: 139 QT Interval:  459 QTC Calculation: 543 R Axis:   -65 Text Interpretation:  Sinus rhythm Left atrial enlargement Right bundle branch block Inferior infarct, old Probable posterior infarct, acute Lateral leads are also involved Unchanged compared to ECG on  05/06/13 Confirmed by WARD  DO, KRISTEN [id number]) on 05/19/2013 11:43:21 AM            MDM   1. Post-op pain      1:06pm- patient work-up unremarkable. I spoke with [facility] cardiology who has agreed to come consult on the patient for further evaluation.    Recommended assessment and plan by Cardiology ASSESSMENT AND PLAN  1. Chest wall pain: Likely related to recent surgery. Pain worse with palpation, deep breathing, position changes. No evidence of instability on exam. CXR non-acute. ECG non-acute. Troponin normal despite prolonged Ss. MSK chest pain. Rec refill of prn narcotic if he needs it. No further inpatient cardiac w/u.  2. CAD: S/p CABG. As above, there  Signed,  Murray Hodgkins, NP  05/19/2013, 2:15 PM  Patient remains pain free and stable in the ED. Cleared by cardiology for home. Discussed with Attending who is comfortable with cardiologies assessment. Percocet refilled for pain. He is to follow-up in the clinic.  62 y.o.Windell Moment evaluation in the Emergency Department is complete. It has been determined that no acute conditions requiring further emergency intervention are present at this time. The patient/guardian have been advised of the diagnosis and plan. We have discussed signs and symptoms that warrant return to the ED, such as changes or worsening in symptoms.  Vital signs are stable at discharge. Filed Vitals:  05/19/13 1415  BP: 118/67  Pulse: 82  Temp:   Resp: 20    Patient/guardian has voiced understanding and agreed to follow-up with the PCP or specialist.    Linus Mako, PA-C 05/19/13 1425

## 2013-05-19 NOTE — ED Notes (Addendum)
Pt reports to the ED for eval of intermittent midsternal CP that began yesterday and that is worse upon palpation. Pt reports the pain does not radiate. Pt denies any associated symptoms. Pt had open heart surgery on 11/1. Pt is supposed to have a follow up appointment on 12/8 but reports he could not take the pain. 12 lead en route showed RBBB. Pt does not know if he has a hx of this or not. Pt has had 650 mg of ASA this morning prior to arrival. Surgical site appears well healed and no s/s of infections present. Pt denies any fevers or chills. Pt A&O x4, resp e/u, and skin warm and dry.

## 2013-05-25 ENCOUNTER — Other Ambulatory Visit: Payer: Self-pay | Admitting: *Deleted

## 2013-05-25 DIAGNOSIS — I2510 Atherosclerotic heart disease of native coronary artery without angina pectoris: Secondary | ICD-10-CM

## 2013-05-29 ENCOUNTER — Ambulatory Visit (HOSPITAL_COMMUNITY)
Admit: 2013-05-29 | Discharge: 2013-05-29 | Disposition: A | Discharge status: Home / Self Care | Payer: Medicaid Other | Source: Ambulatory Visit | Attending: Internal Medicine | Admitting: Internal Medicine

## 2013-05-29 VITALS — BP 106/68 | HR 85 | Wt 190.0 lb

## 2013-05-29 DIAGNOSIS — I4891 Unspecified atrial fibrillation: Secondary | ICD-10-CM

## 2013-05-29 DIAGNOSIS — I2510 Atherosclerotic heart disease of native coronary artery without angina pectoris: Secondary | ICD-10-CM

## 2013-05-29 DIAGNOSIS — I5022 Chronic systolic (congestive) heart failure: Secondary | ICD-10-CM

## 2013-05-29 LAB — CBC
MCH: 26.7 pg (ref 26.0–34.0)
MCHC: 32 g/dL (ref 30.0–36.0)
Platelets: 256 10*3/uL (ref 150–400)

## 2013-05-29 LAB — BASIC METABOLIC PANEL
BUN: 21 mg/dL (ref 6–23)
CO2: 21 mEq/L (ref 19–32)
Calcium: 9.6 mg/dL (ref 8.4–10.5)
Chloride: 101 mEq/L (ref 96–112)
Creatinine, Ser: 1.03 mg/dL (ref 0.50–1.35)
GFR calc non Af Amer: 76 mL/min — ABNORMAL LOW (ref >90)
Glucose, Bld: 89 mg/dL (ref 70–99)
Sodium: 134 mEq/L — ABNORMAL LOW (ref 135–145)

## 2013-05-29 NOTE — Progress Notes (Signed)
Patient ID: [name] [name], male   DOB: [birth year], 62 y.o.   MRN: [id number]     ADVANCED HF CLINIC NOTE   SUBJECTIVE:   [name] [name] is a 62 yo with history of COPD and HTN admitted with ADHF in 11/14.  Echo showed EF 20-25% with moderate RV dysfunction.  Cath with severe 3V CAD. RHC as below.  Patient underwent CABG on 11/15 with Dr. Roxan Hockey  (left internal mammary artery to left anterior  descending, saphenous vein graft to second diagonal, sequential saphenous vein graft to ramus intermedius and obtuse marginal 2,  saphenous vein graft to posterior descending).  Returns for post-hospital f/u. Doing well.  No shortness of breath. Chest a little sore but otherwise ok. No orthopnea, PND or orthopnea.   Weighing at home everyday. No problems with medicines. Feet are numb at times. Weight stable since d/c at 190 pounds.   Medications    Medication List    ASK your doctor about these medications       albuterol 108 (90 BASE) MCG/ACT inhaler  Commonly known as:  PROVENTIL HFA;VENTOLIN HFA  Inhale 2 puffs into the lungs every 4 (four) hours as needed. For shortness of breath     allopurinol 300 MG tablet  Commonly known as:  ZYLOPRIM  Take 300 mg by mouth daily.     amiodarone 200 MG tablet  Commonly known as:  PACERONE  Take 200 mg by mouth daily.     aspirin 325 MG EC tablet  Take 1 tablet (325 mg total) by mouth daily.     b complex vitamins tablet  Take 1 tablet by mouth daily.     carvedilol 3.125 MG tablet  Commonly known as:  COREG  Take 3.125 mg by mouth 2 (two) times daily with a meal.     colchicine 0.6 MG tablet  Take 0.6-1.2 mg by mouth daily. Take 2 tablets at the onset of gout symptoms, then take 1 tablet in an hour for a max of 3 tablets per 24 hours     ferrous gluconate 324 MG tablet  Commonly known as:  FERGON  Take 1 tablet (324 mg total) by mouth 2 (two) times daily with a meal.     furosemide 40 MG tablet  Commonly known as:  LASIX  Take 1 tablet  (40 mg total) by mouth 2 (two) times daily.     lisinopril 5 MG tablet  Commonly known as:  PRINIVIL,ZESTRIL  Take 1 tablet (5 mg total) by mouth daily.     lubiprostone 24 MCG capsule  Commonly known as:  AMITIZA  Take 24 mcg by mouth 2 (two) times daily with a meal.     omeprazole 20 MG capsule  Commonly known as:  PRILOSEC  Take 20 mg by mouth daily.     ondansetron 4 MG tablet  Commonly known as:  ZOFRAN  Take 4 mg by mouth every 6 (six) hours as needed for nausea or vomiting.     oxyCODONE 5 MG immediate release tablet  Commonly known as:  Oxy IR/ROXICODONE  Take 1-2 tablets (5-10 mg total) by mouth every 3 (three) hours as needed for severe pain.     potassium chloride SA 20 MEQ tablet  Commonly known as:  K-DUR,KLOR-CON  Take 1 tablet (20 mEq total) by mouth daily.     simvastatin 20 MG tablet  Commonly known as:  ZOCOR  Take 1 tablet (20 mg total) by mouth daily at 6 PM.  spironolactone 25 MG tablet  Commonly known as:  ALDACTONE  Take 12.5 mg by mouth daily.     tiotropium 18 MCG inhalation capsule  Commonly known as:  SPIRIVA  Place 18 mcg into inhaler and inhale daily.          PHYSICAL EXAM  Filed Vitals:   05/29/13 0931  BP: 106/68  Pulse: 85  Weight: 190 lb (86.183 kg)  SpO2: 98%    General: NAD HEENT: normal except for poor dentition Neck: JVP flat, no thyromegaly or thyroid nodule.  Lungs: clear CV: Sternal scar well-healed Nondisplaced PMI.  Heart regular S1/S2, no S3/S4, 1/6 HSM LLSB.  No edema.  No carotid bruit.   Abdomen: Soft, nontender, no hepatosplenomegaly nodistention.  Neurologic: A&O x 3 Cranial nerves intact Psych: Normal affect. Extremities: No clubbing or cyanosis.   ASSESSMENT AND PLAN:  1. CAD s/p recent CABG 2. Chronic systolic HF, EF 0000000 3. COPD  - smoking an occasional cigarette. Counseled to stop.  4. Post-op AF  Overall I think he looks great post CABG. Volume status looks good. Will get ECG and labs  today. Can stop amiodarone. Will not make any other med changes today. Will refer to Cardiac Rehab at [facility]. Reinforced need for daily weights and reviewed use of sliding scale diuretics. Stressed need for smoking cessation. May need to decrease lasix to once a day soon. Will see back in 1 month with echo.    Glori Bickers MD 05/29/2013 9:47 AM

## 2013-05-29 NOTE — Patient Instructions (Signed)
Stop Amiodarone  Labs today  Your physician recommends that you schedule a follow-up appointment in: 1 month with echocardiogram

## 2013-05-30 ENCOUNTER — Ambulatory Visit (INDEPENDENT_AMBULATORY_CARE_PROVIDER_SITE_OTHER): Payer: Medicaid Other | Admitting: Thoracic Surgery (Cardiothoracic Vascular Surgery)

## 2013-05-30 ENCOUNTER — Ambulatory Visit
Admission: RE | Admit: 2013-05-30 | Discharge: 2013-05-30 | Disposition: A | Discharge status: Home / Self Care | Payer: Medicaid Other | Source: Ambulatory Visit | Attending: Thoracic Surgery (Cardiothoracic Vascular Surgery) | Admitting: Thoracic Surgery (Cardiothoracic Vascular Surgery)

## 2013-05-30 ENCOUNTER — Encounter: Payer: Self-pay | Admitting: Thoracic Surgery (Cardiothoracic Vascular Surgery)

## 2013-05-30 VITALS — BP 98/70 | HR 82 | Resp 16 | Ht 67.5 in | Wt 190.0 lb

## 2013-05-30 DIAGNOSIS — I2510 Atherosclerotic heart disease of native coronary artery without angina pectoris: Secondary | ICD-10-CM

## 2013-05-30 DIAGNOSIS — Z951 Presence of aortocoronary bypass graft: Secondary | ICD-10-CM

## 2013-05-30 NOTE — Progress Notes (Signed)
HPI:  [name] [name] is a 62 year old male who underwent coronary bypass grafting x5 on November 14 for severe three-vessel coronary disease with severe ischemic cardiomyopathy. His ejection fraction was 20-25% and he had presented in heart failure. He had some gout postoperatively but otherwise did well and was discharged on postoperative day #6.  He says that he's been doing pretty well. He's not having any anginal pain or shortness of breath. He does have some pain from his chest incision, but says it is not severe. His primary complaint is pain in his right foot. He says that this is often a burning pain and it wakes him up at night. He also has a sore on the right foot that is not healing. He hasn't noticed any swelling in his legs.  Past Medical History  Diagnosis Date  . COPD (chronic obstructive pulmonary disease)   . Gout   . Emphysema   . Hypertension   . Hyperlipidemia   . Tobacco abuse     30+ pack-year history  . Diabetes mellitus without complication   . Chronic systolic CHF (congestive heart failure)     a. 04/2013 Echo: EF 15-20%, diff HK, sev HK of inf and ant myocardium, dilated LA, improved EF post cardiopulmonary bypass.  Marland Kitchen CAD (coronary artery disease)     a. 04/2013 Cath: LM 10, LAD 40p, 24m, D1 50p, LCX 60p, 76m/100m, OM1 50, OM2 100 L-L collats, RCA 100p;  b. CABG x 5: LIMA->LAD, VG->D2, VG->RI->OM2, VG->PDA.  . Transaminitis     a. 04/2013 Abd U/S and CT unremarkable, hepatitis panel neg -->felt to be 2/2 acute R heart failure.      Current Outpatient Prescriptions  Medication Sig Dispense Refill  . albuterol (PROVENTIL HFA;VENTOLIN HFA) 108 (90 BASE) MCG/ACT inhaler Inhale 2 puffs into the lungs every 4 (four) hours as needed. For shortness of breath       . allopurinol (ZYLOPRIM) 300 MG tablet Take 300 mg by mouth daily.      Marland Kitchen aspirin EC 325 MG EC tablet Take 1 tablet (325 mg total) by mouth daily.  30 tablet  0  . b complex vitamins tablet Take 1 tablet by  mouth daily.      . carvedilol (COREG) 3.125 MG tablet Take 3.125 mg by mouth 2 (two) times daily with a meal.      . colchicine 0.6 MG tablet Take 0.6-1.2 mg by mouth daily. Take 2 tablets at the onset of gout symptoms, then take 1 tablet in an hour for a max of 3 tablets per 24 hours      . ferrous gluconate (FERGON) 324 MG tablet Take 1 tablet (324 mg total) by mouth 2 (two) times daily with a meal.  60 tablet  3  . furosemide (LASIX) 40 MG tablet Take 1 tablet (40 mg total) by mouth 2 (two) times daily.  60 tablet  6  . lisinopril (PRINIVIL,ZESTRIL) 5 MG tablet Take 1 tablet (5 mg total) by mouth daily.  30 tablet  3  . lubiprostone (AMITIZA) 24 MCG capsule Take 24 mcg by mouth 2 (two) times daily with a meal.      . omeprazole (PRILOSEC) 20 MG capsule Take 20 mg by mouth daily.      . ondansetron (ZOFRAN) 4 MG tablet Take 4 mg by mouth every 6 (six) hours as needed for nausea or vomiting.      Marland Kitchen oxyCODONE (OXY IR/ROXICODONE) 5 MG immediate release tablet Take 1-2 tablets (5-10 mg  total) by mouth every 3 (three) hours as needed for severe pain.  30 tablet  0  . potassium chloride SA (K-DUR,KLOR-CON) 20 MEQ tablet Take 1 tablet (20 mEq total) by mouth daily.  30 tablet  6  . simvastatin (ZOCOR) 20 MG tablet Take 1 tablet (20 mg total) by mouth daily at 6 PM.  30 tablet  3  . spironolactone (ALDACTONE) 25 MG tablet Take 12.5 mg by mouth daily.      Marland Kitchen tiotropium (SPIRIVA) 18 MCG inhalation capsule Place 18 mcg into inhaler and inhale daily.       No current facility-administered medications for this visit.    Physical Exam BP 98/70  Pulse 82  Resp 16  Ht 5' 7.5" (1.715 m)  Wt 190 lb (86.183 kg)  BMI 29.30 kg/m2  SpO66 51% 62 year old male in no acute distress Neuro alert and oriented with no focal deficits Cardiac regular rate and rhythm no murmur or rub  Sternum stable, incision healing well Lungs clear to auscultation bilaterally Leg wounds healing well, no peripheral edema Eschar  on the dorsal aspect of right foot. No palpable distal pulses  Diagnostic Tests: CHEST 2 VIEW  COMPARISON: 05/19/2013.  FINDINGS:  The cardiac silhouette, mediastinal and hilar contours are stable.  Stable surgical changes from bypass surgery. The lungs are clear. No  pleural effusion or pulmonary edema. The bony thorax is intact.  IMPRESSION:  Stable postoperative changes from bypass surgery. No acute pulmonary  findings.  Electronically Signed  By: Kalman Jewels M.D.  On: 05/30/2013 11:04    Impression:  [name] [name] is a 62 year old gentleman who underwent coronary bypass grafting for severe three-vessel disease with ischemic cardiomyopathy. He presented in acute diastolic heart failure. He had dramatic improvement in his hepatic and renal function with preoperative medical therapy. His operation and postoperative course were relatively uncomplicated.  His incisions are healing well. His chest x-ray shows no effusions. His postoperative pain has well-controlled. Overall I am very pleased with his progress.  My primary concern is his pain in his right foot. He has an 0.57 on that side and I suspect he is having rest pain. He also has a nonhealing wound on the right foot. I am going to refer him to vascular surgeons for assessment.  From a cardiac surgical standpoint he is still not to lift anything over 10 pounds until the first of the year. His activities are otherwise unrestricted.

## 2013-05-31 ENCOUNTER — Other Ambulatory Visit: Payer: Self-pay | Admitting: *Deleted

## 2013-05-31 DIAGNOSIS — I739 Peripheral vascular disease, unspecified: Secondary | ICD-10-CM

## 2013-06-01 ENCOUNTER — Other Ambulatory Visit (HOSPITAL_COMMUNITY): Payer: Self-pay

## 2013-06-01 ENCOUNTER — Encounter: Payer: Self-pay | Admitting: Vascular Surgery

## 2013-06-13 ENCOUNTER — Encounter: Payer: Self-pay | Admitting: Vascular Surgery

## 2013-06-14 ENCOUNTER — Encounter: Payer: Self-pay | Admitting: Vascular Surgery

## 2013-06-14 ENCOUNTER — Ambulatory Visit (INDEPENDENT_AMBULATORY_CARE_PROVIDER_SITE_OTHER): Payer: Medicaid Other | Admitting: Vascular Surgery

## 2013-06-14 ENCOUNTER — Ambulatory Visit (HOSPITAL_COMMUNITY)
Admission: RE | Admit: 2013-06-14 | Discharge: 2013-06-14 | Disposition: A | Discharge status: Home / Self Care | Payer: Medicaid Other | Source: Ambulatory Visit | Attending: Vascular Surgery | Admitting: Vascular Surgery

## 2013-06-14 VITALS — BP 107/71 | HR 86 | Temp 98.4°F | Resp 16 | Ht 67.5 in | Wt 190.0 lb

## 2013-06-14 DIAGNOSIS — L98499 Non-pressure chronic ulcer of skin of other sites with unspecified severity: Secondary | ICD-10-CM | POA: Insufficient documentation

## 2013-06-14 DIAGNOSIS — I7025 Atherosclerosis of native arteries of other extremities with ulceration: Secondary | ICD-10-CM | POA: Insufficient documentation

## 2013-06-14 DIAGNOSIS — M79609 Pain in unspecified limb: Secondary | ICD-10-CM

## 2013-06-14 DIAGNOSIS — R209 Unspecified disturbances of skin sensation: Secondary | ICD-10-CM

## 2013-06-14 DIAGNOSIS — L97509 Non-pressure chronic ulcer of other part of unspecified foot with unspecified severity: Secondary | ICD-10-CM | POA: Insufficient documentation

## 2013-06-14 DIAGNOSIS — R200 Anesthesia of skin: Secondary | ICD-10-CM | POA: Insufficient documentation

## 2013-06-14 DIAGNOSIS — I739 Peripheral vascular disease, unspecified: Secondary | ICD-10-CM

## 2013-06-14 NOTE — Addendum Note (Signed)
Addended by: Mena Goes on: 06/14/2013 12:25 PM   Modules accepted: Orders

## 2013-06-14 NOTE — Assessment & Plan Note (Signed)
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 However, the wound on the dorsum of his right foot has healed at this point. His symptoms in the right foot are improving. For this reason, I would like to hold off on arteriography. He has had vein taken from both legs for his CABG. Based on his duplex, it is unlikely that he would have disease amenable to angioplasty on the right as the disease is fairly diffuse. As the wound has healed, and his symptoms are improving, I think it is safe to simply follow this closely. I've ordered a follow up visit in 3 months and I'll see him back at that time. He knows to call sooner if he has problems. I've also discussed with him the importance of tobacco cessation. Also encouraged him to ambulate as much as possible.

## 2013-06-14 NOTE — Progress Notes (Signed)
[facility]  Patient name: [name] [name] MRN: [id number] DOB: 05/12/1951 Sex: male  REASON FOR CONSULT: ulcer on dorsum of right foot. Referred by Dr. Roxan Hockey.  HPI: [name] [name] is a 62 y.o. male who developed a blister on both feet approximately 3 months ago. The blister on the left foot healed. He was having a persistent wound in the right foot and was referred for vascular consultation. He does not remember any specific injury to the foot. I do not get any clear-cut history of claudication although it sounds like his activity is fairly limited. He does describe some rest pain in the right foot. However, this seems to be improving as the wound on his foot has healed. He does have a history of tobacco use but is trying to quit currently.   Past Medical History  Diagnosis Date  . COPD (chronic obstructive pulmonary disease)   . Gout   . Emphysema   . Hypertension   . Hyperlipidemia   . Tobacco abuse     30+ pack-year history  . Diabetes mellitus without complication   . Chronic systolic CHF (congestive heart failure)     a. 04/2013 Echo: EF 15-20%, diff HK, sev HK of inf and ant myocardium, dilated LA, improved EF post cardiopulmonary bypass.  Marland Kitchen CAD (coronary artery disease)     a. 04/2013 Cath: LM 10, LAD 40p, 66m, D1 50p, LCX 60p, 61m/100m, OM1 50, OM2 100 L-L collats, RCA 100p;  b. CABG x 5: LIMA->LAD, VG->D2, VG->RI->OM2, VG->PDA.  . Transaminitis     a. 04/2013 Abd U/S and CT unremarkable, hepatitis panel neg -->felt to be 2/2 acute R heart failure.   Family History  Problem Relation Age of Onset  . Diabetes Mother   . Hypertension    . Heart attack      Brothers   SOCIAL HISTORY: History  Substance Use Topics  . Smoking status: Current Every Day Smoker -- 1.00 packs/day for 30 years    Last Attempt to Quit: 05/11/2011  . Smokeless tobacco: Never Used     Comment: has had a few cigarettes since discharge after CABG.  Marland Kitchen Alcohol Use: No    Allergies  Allergen Reactions  . Penicillins Other (See Comments)    Swelling , long time ago.    Current Outpatient Prescriptions  Medication Sig Dispense Refill  . albuterol (PROVENTIL HFA;VENTOLIN HFA) 108 (90 BASE) MCG/ACT inhaler Inhale 2 puffs into the lungs every 4 (four) hours as needed. For shortness of breath       . allopurinol (ZYLOPRIM) 300 MG tablet Take 300 mg by mouth daily.      Marland Kitchen aspirin EC 325 MG EC tablet Take 1 tablet (325 mg total) by mouth daily.  30 tablet  0  . b complex vitamins tablet Take 1 tablet by mouth daily.      . carvedilol (COREG) 3.125 MG tablet Take 3.125 mg by mouth 2 (two) times daily with a meal.      . ferrous gluconate (FERGON) 324 MG tablet Take 1 tablet (324 mg total) by mouth 2 (two) times daily with a meal.  60 tablet  3  . furosemide (LASIX) 40 MG tablet Take 1 tablet (40 mg total) by mouth 2 (two) times daily.  60 tablet  6  . gabapentin (NEURONTIN) 300 MG capsule Take 300 mg by mouth 2 (two) times daily.      Marland Kitchen lisinopril (PRINIVIL,ZESTRIL) 5 MG tablet Take 1 tablet (5  mg total) by mouth daily.  30 tablet  3  . lubiprostone (AMITIZA) 24 MCG capsule Take 24 mcg by mouth 2 (two) times daily with a meal.      . omeprazole (PRILOSEC) 20 MG capsule Take 20 mg by mouth daily.      . potassium chloride SA (K-DUR,KLOR-CON) 20 MEQ tablet Take 1 tablet (20 mEq total) by mouth daily.  30 tablet  6  . simvastatin (ZOCOR) 20 MG tablet Take 1 tablet (20 mg total) by mouth daily at 6 PM.  30 tablet  3  . spironolactone (ALDACTONE) 25 MG tablet Take 12.5 mg by mouth daily.      Marland Kitchen tiotropium (SPIRIVA) 18 MCG inhalation capsule Place 18 mcg into inhaler and inhale daily.      . colchicine 0.6 MG tablet Take 0.6-1.2 mg by mouth daily. Take 2 tablets at the onset of gout symptoms, then take 1 tablet in an hour for a max of 3 tablets per 24 hours      . ondansetron (ZOFRAN) 4 MG tablet Take 4 mg by mouth every 6 (six) hours as needed for nausea or vomiting.       Marland Kitchen oxyCODONE (OXY IR/ROXICODONE) 5 MG immediate release tablet Take 1-2 tablets (5-10 mg total) by mouth every 3 (three) hours as needed for severe pain.  30 tablet  0   No current facility-administered medications for this visit.   REVIEW OF SYSTEMS: Valu.Nieves ] denotes positive finding; [  ] denotes negative finding  CARDIOVASCULAR:  [ ]  chest pain   [ ]  chest pressure   [ ]  palpitations   [ ]  orthopnea   [ ]  dyspnea on exertion   Valu.Nieves ] claudication   Valu.Nieves ] rest pain   [ ]  DVT   [ ]  phlebitis PULMONARY:   [ ]  productive cough   [ ]  asthma   [ ]  wheezing NEUROLOGIC:   [ ]  weakness  [ ]  paresthesias  [ ]  aphasia  [ ]  amaurosis  [ ]  dizziness HEMATOLOGIC:   [ ]  bleeding problems   [ ]  clotting disorders MUSCULOSKELETAL:  [ ]  joint pain   [ ]  joint swelling [ ]  leg swelling GASTROINTESTINAL: [ ]   blood in stool  [ ]   hematemesis GENITOURINARY:  [ ]   dysuria  [ ]   hematuria PSYCHIATRIC:  [ ]  history of major depression INTEGUMENTARY:  [ ]  rashes  [ ]  ulcers CONSTITUTIONAL:  [ ]  fever   [ ]  chills  PHYSICAL EXAM: Filed Vitals:   06/14/13 1104  BP: 107/71  Pulse: 86  Temp: 98.4 F (36.9 C)  TempSrc: Oral  Resp: 16  Height: 5' 7.5" (1.715 m)  Weight: 190 lb (86.183 kg)  SpO2: 99%   Body mass index is 29.3 kg/(m^2). GENERAL: The patient is a well-nourished male, in no acute distress. The vital signs are documented above. CARDIOVASCULAR: There is a regular rate and rhythm. I do not detect carotid bruits. He has palpable femoral pulses. I cannot palpate popliteal or pedal pulses. PULMONARY: There is good air exchange bilaterally without wheezing or rales. ABDOMEN: Soft and non-tender with normal pitched bowel sounds.  MUSCULOSKELETAL: There are no major deformities or cyanosis. NEUROLOGIC: No focal weakness or paresthesias are detected. SKIN: the wound on the dorsum of his right foot at this point is healed. There is no open ulcer currently. PSYCHIATRIC: The patient has a normal affect.  DATA:   I have independently interpreted his duplex scan today. He does  have diffuse plaque throughout both lower extremities. He has a short segment occlusion of his right superficial femoral artery with extensive collaterals. He has monophasic Doppler signals in the posterior tibial, dorsalis pedis, and peroneal positions bilaterally.  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 However, the wound on the dorsum of his right foot has healed at this point. His symptoms in the right foot are improving. For this reason, I would like to hold off on arteriography. He has had vein taken from both legs for his CABG. Based on his duplex, it is unlikely that he would have disease amenable to angioplasty on the right as the disease is fairly diffuse. As the wound has healed, and his symptoms are improving, I think it is safe to simply follow this closely. I've ordered a follow up visit in 3 months and I'll see him back at that time. He knows to call sooner if he has problems. I've also discussed with him the importance of tobacco cessation. Also encouraged him to ambulate as much as possible.   [place] [facility] [place] Beeper: [phone]

## 2013-06-21 ENCOUNTER — Other Ambulatory Visit (HOSPITAL_COMMUNITY): Payer: Self-pay | Admitting: Cardiology

## 2013-06-21 DIAGNOSIS — I509 Heart failure, unspecified: Secondary | ICD-10-CM

## 2013-06-26 ENCOUNTER — Encounter (HOSPITAL_COMMUNITY): Payer: Self-pay

## 2013-06-26 ENCOUNTER — Ambulatory Visit (HOSPITAL_COMMUNITY)
Admission: RE | Admit: 2013-06-26 | Discharge: 2013-06-26 | Disposition: A | Discharge status: Home / Self Care | Payer: Medicaid Other | Source: Ambulatory Visit | Attending: Internal Medicine | Admitting: Internal Medicine

## 2013-06-26 ENCOUNTER — Ambulatory Visit (HOSPITAL_BASED_OUTPATIENT_CLINIC_OR_DEPARTMENT_OTHER)
Admission: RE | Admit: 2013-06-26 | Discharge: 2013-06-26 | Disposition: A | Discharge status: Home / Self Care | Payer: Medicaid Other | Source: Ambulatory Visit | Attending: Internal Medicine | Admitting: Internal Medicine

## 2013-06-26 VITALS — BP 99/67 | HR 87 | Resp 16 | Wt 194.5 lb

## 2013-06-26 DIAGNOSIS — J4489 Other specified chronic obstructive pulmonary disease: Secondary | ICD-10-CM | POA: Insufficient documentation

## 2013-06-26 DIAGNOSIS — I059 Rheumatic mitral valve disease, unspecified: Secondary | ICD-10-CM

## 2013-06-26 DIAGNOSIS — I079 Rheumatic tricuspid valve disease, unspecified: Secondary | ICD-10-CM | POA: Insufficient documentation

## 2013-06-26 DIAGNOSIS — I2510 Atherosclerotic heart disease of native coronary artery without angina pectoris: Secondary | ICD-10-CM

## 2013-06-26 DIAGNOSIS — I5022 Chronic systolic (congestive) heart failure: Secondary | ICD-10-CM

## 2013-06-26 DIAGNOSIS — F17200 Nicotine dependence, unspecified, uncomplicated: Secondary | ICD-10-CM | POA: Insufficient documentation

## 2013-06-26 DIAGNOSIS — Z951 Presence of aortocoronary bypass graft: Secondary | ICD-10-CM | POA: Insufficient documentation

## 2013-06-26 DIAGNOSIS — J449 Chronic obstructive pulmonary disease, unspecified: Secondary | ICD-10-CM | POA: Insufficient documentation

## 2013-06-26 DIAGNOSIS — I10 Essential (primary) hypertension: Secondary | ICD-10-CM | POA: Insufficient documentation

## 2013-06-26 DIAGNOSIS — M109 Gout, unspecified: Secondary | ICD-10-CM | POA: Insufficient documentation

## 2013-06-26 DIAGNOSIS — I509 Heart failure, unspecified: Secondary | ICD-10-CM

## 2013-06-26 DIAGNOSIS — I379 Nonrheumatic pulmonary valve disorder, unspecified: Secondary | ICD-10-CM | POA: Insufficient documentation

## 2013-06-26 MED ORDER — CARVEDILOL 3.125 MG PO TABS
ORAL_TABLET | ORAL | Status: DC
Start: 2013-06-26 — End: 2013-10-16

## 2013-06-26 NOTE — Patient Instructions (Signed)
Follow up in 1 month  Take carvedilol 3.125 mg in the morning (1 tablet)  and 6.25 mg in the evening (2 tablets).   Do the following things EVERYDAY: 1) Weigh yourself in the morning before breakfast. Write it down and keep it in a log. 2) Take your medicines as prescribed 3) Eat low salt foods-Limit salt (sodium) to 2000 mg per day.  4) Stay as active as you can everyday 5) Limit all fluids for the day to less than 2 liters

## 2013-06-26 NOTE — Progress Notes (Signed)
Patient ID: [name] [name], male   DOB: 29-Mar-1951, 63 y.o.   MRN: [id number]   ADVANCED HF CLINIC NOTE   SUBJECTIVE:  [name] is a 63 yo with history of COPD and HTN admitted with ADHF in 11/14.  Echo showed EF 20-25% with moderate RV dysfunction.  Cath with severe 3V CAD.Patient underwent CABG on 11/15 with Dr. Roxan Hockey  (left internal mammary artery to left anterior  descending, saphenous vein graft to second diagonal, sequential saphenous vein graft to ramus intermedius and obtuse marginal 2,  saphenous vein graft to posterior descending).  He returns for follow up.Continues to do quite well. Compliant with meds. Last visit amiodarone was stopped. Denies SOB/PND/Orthopnea/CP.Able to do ADLs without difficulty. Denies dizziness. Weight at home 184-189 pounds. He has not started cardiac rehab. Smoking 1 pack of cigarettes every 2 days.   ECHO 06/26/13 EF 20-25% RV mild HK   Medications    Medication List    ASK your doctor about these medications       albuterol 108 (90 BASE) MCG/ACT inhaler  Commonly known as:  PROVENTIL HFA;VENTOLIN HFA  Inhale 2 puffs into the lungs every 4 (four) hours as needed. For shortness of breath     allopurinol 300 MG tablet  Commonly known as:  ZYLOPRIM  Take 300 mg by mouth daily.     aspirin 325 MG EC tablet  Take 1 tablet (325 mg total) by mouth daily.     b complex vitamins tablet  Take 1 tablet by mouth daily.     carvedilol 3.125 MG tablet  Commonly known as:  COREG  Take 3.125 mg by mouth 2 (two) times daily with a meal.     colchicine 0.6 MG tablet  Take 0.6-1.2 mg by mouth daily. Take 2 tablets at the onset of gout symptoms, then take 1 tablet in an hour for a max of 3 tablets per 24 hours     ferrous gluconate 324 MG tablet  Commonly known as:  FERGON  Take 1 tablet (324 mg total) by mouth 2 (two) times daily with a meal.     furosemide 40 MG tablet  Commonly known as:  LASIX  Take 1 tablet (40 mg total) by mouth 2 (two) times  daily.     gabapentin 300 MG capsule  Commonly known as:  NEURONTIN  Take 300 mg by mouth 2 (two) times daily.     lisinopril 5 MG tablet  Commonly known as:  PRINIVIL,ZESTRIL  Take 1 tablet (5 mg total) by mouth daily.     lubiprostone 24 MCG capsule  Commonly known as:  AMITIZA  Take 24 mcg by mouth 2 (two) times daily with a meal.     omeprazole 20 MG capsule  Commonly known as:  PRILOSEC  Take 20 mg by mouth daily.     ondansetron 4 MG tablet  Commonly known as:  ZOFRAN  Take 4 mg by mouth every 6 (six) hours as needed for nausea or vomiting.     oxyCODONE 5 MG immediate release tablet  Commonly known as:  Oxy IR/ROXICODONE  Take 1-2 tablets (5-10 mg total) by mouth every 3 (three) hours as needed for severe pain.     potassium chloride SA 20 MEQ tablet  Commonly known as:  K-DUR,KLOR-CON  Take 1 tablet (20 mEq total) by mouth daily.     simvastatin 20 MG tablet  Commonly known as:  ZOCOR  Take 1 tablet (20 mg total) by mouth daily at  6 PM.     spironolactone 25 MG tablet  Commonly known as:  ALDACTONE  Take 12.5 mg by mouth daily.     tiotropium 18 MCG inhalation capsule  Commonly known as:  SPIRIVA  Place 18 mcg into inhaler and inhale daily.          PHYSICAL EXAM  Filed Vitals:   06/26/13 1059  BP: 99/67  Pulse: 87  Resp: 16  Weight: 194 lb 8 oz (88.225 kg)  SpO2: 100%    General: NAD HEENT: normal except for poor dentition Neck: JVP flat, no thyromegaly or thyroid nodule.  Lungs: clear CV: Sternal scar well-healed Nondisplaced PMI.  Heart regular S1/S2, no S3/S4, 1/6 HSM LLSB.  No edema.  No carotid bruit.   Abdomen: Soft, nontender, no hepatosplenomegaly nodistention.  Neurologic: A&O x 3 Cranial nerves intact Psych: Normal affect. Extremities: No clubbing or cyanosis.   ASSESSMENT AND PLAN:  1. CAD s/p recent CABG- Stable no ischemia. Continue aspirin and simvastatin. Needs to start cardiac rehab in [facility].  2. Chronic systolic  HF, EF 62-03%- Dr Haroldine Laws discussed and reviewed ECHO. Volume status stable. Continue lasix 40 mg twice a day.  Continue carvedilol 3.125 mg in am and increase night time dose to 6.25 mg in pm.  Continue lisinopril 5 mg daily.  Continue titrate HF meds if EF remains < 35 % will need to refer for ICD.  3. COPD  - smoking 1 PPD every 2 days.  Counseled to stop. Declines smokin gcessation.  4. Post-op AF- resolved. Off amiodarone.   Follow up in 1 month   CLEGG,AMY NP-C 06/26/2013 11:13 AM  Patient seen and examined with Darrick Grinder, NP. We discussed all aspects of the encounter. I agree with the assessment and plan as stated above  Echo reviewed personally in clinic.Overall, continues to improve despite persistent severe LV dysfunction. NYHA II. Volume status looks good. BP soft but will attempt to push nighttime b-blocker up as tolerated. Discussed possible need for ICD if EF not improving. Will refer for Cardiac Rehab. Counseled on need to stop smoking.   Daniel Bensimhon,MD 8:25 PM

## 2013-06-26 NOTE — Progress Notes (Signed)
  Echocardiogram 2D Echocardiogram has been performed.  [place], [place] 06/26/2013, 11:04 AM

## 2013-07-18 ENCOUNTER — Ambulatory Visit (INDEPENDENT_AMBULATORY_CARE_PROVIDER_SITE_OTHER): Payer: Medicaid Other | Admitting: Podiatrist

## 2013-07-18 ENCOUNTER — Encounter: Payer: Self-pay | Admitting: Podiatrist

## 2013-07-18 VITALS — BP 99/60 | HR 84 | Resp 12 | Ht 67.0 in | Wt 194.0 lb

## 2013-07-18 DIAGNOSIS — L600 Ingrowing nail: Secondary | ICD-10-CM

## 2013-07-18 DIAGNOSIS — L608 Other nail disorders: Secondary | ICD-10-CM

## 2013-07-18 DIAGNOSIS — I739 Peripheral vascular disease, unspecified: Secondary | ICD-10-CM

## 2013-07-18 DIAGNOSIS — B351 Tinea unguium: Secondary | ICD-10-CM

## 2013-07-18 NOTE — Progress Notes (Signed)
   Subjective:    Patient ID: [name] [name], male    DOB: November 26, 1950, 63 y.o.   MRN: [id number]  HPI patient presents today for foot and nail care.  States he has had an ulcer on the top of his right foot in the past and it took a long time to heal.  Otherwise has had no foot related issues.     Review of Systems  Musculoskeletal: Positive for gait problem.  All other systems reviewed and are negative.       Objective:   Physical Exam Pedal pulses faintly palpable on the left at 1/4 dp and 0/4 pt pulses.  Right has non palpable pedal pulses.  Digital hair growth absent bilateral.  Skin temperature is warm to cool to the distal digits. There is a scar on the dorsal aspect of the right foot from her previous ulceration that is gone on to heal uneventfully. No interdigital macerations are present no pre-ulcerative lesions are seen rectus foot type with rectus appearance of digits is noted. Patient's toenails are elongated, thickened, ingrown and deformed 1 through 5 bilateral.     Assessment & Plan:  Peripheral vascular disease with symptomatic mycotic toenails  Plan: Debrided the toenails without complication. No iatrogenic leading or incident occurred. Recommended careful watch over the feet and toes for new onset ulceration or skin lesion. Patient states he's been followed for the circulation [facility] Recommended followup in 3 months or sooner if any problems or concerns arise.

## 2013-07-18 NOTE — Progress Notes (Deleted)
''  TRIM MY TOENAILS.''

## 2013-07-18 NOTE — Patient Instructions (Signed)
Diabetes and Foot Care Diabetes may cause you to have problems because of poor blood supply (circulation) to your feet and legs. This may cause the skin on your feet to become thinner, break easier, and heal more slowly. Your skin may become dry, and the skin may peel and crack. You may also have nerve damage in your legs and feet causing decreased feeling in them. You may not notice minor injuries to your feet that could lead to infections or more serious problems. Taking care of your feet is one of the most important things you can do for yourself.  HOME CARE INSTRUCTIONS  Wear shoes at all times, even in the house. Do not go barefoot. Bare feet are easily injured.  Check your feet daily for blisters, cuts, and redness. If you cannot see the bottom of your feet, use a mirror or ask someone for help.  Wash your feet with warm water (do not use hot water) and mild soap. Then pat your feet and the areas between your toes until they are completely dry. Do not soak your feet as this can dry your skin.  Apply a moisturizing lotion or petroleum jelly (that does not contain alcohol and is unscented) to the skin on your feet and to dry, brittle toenails. Do not apply lotion between your toes.  Trim your toenails straight across. Do not dig under them or around the cuticle. File the edges of your nails with an emery board or nail file.  Do not cut corns or calluses or try to remove them with medicine.  Wear clean socks or stockings every day. Make sure they are not too tight. Do not wear knee-high stockings since they may decrease blood flow to your legs.  Wear shoes that fit properly and have enough cushioning. To break in new shoes, wear them for just a few hours a day. This prevents you from injuring your feet. Always look in your shoes before you put them on to be sure there are no objects inside.  Do not cross your legs. This may decrease the blood flow to your feet.  If you find a minor scrape,  cut, or break in the skin on your feet, keep it and the skin around it clean and dry. These areas may be cleansed with mild soap and water. Do not cleanse the area with peroxide, alcohol, or iodine.  When you remove an adhesive bandage, be sure not to damage the skin around it.  If you have a wound, look at it several times a day to make sure it is healing.  Do not use heating pads or hot water bottles. They may burn your skin. If you have lost feeling in your feet or legs, you may not know it is happening until it is too late.  Make sure your health care provider performs a complete foot exam at least annually or more often if you have foot problems. Report any cuts, sores, or bruises to your health care provider immediately. SEEK MEDICAL CARE IF:   You have an injury that is not healing.  You have cuts or breaks in the skin.  You have an ingrown nail.  You notice redness on your legs or feet.  You feel burning or tingling in your legs or feet.  You have pain or cramps in your legs and feet.  Your legs or feet are numb.  Your feet always feel cold. SEEK IMMEDIATE MEDICAL CARE IF:   There is increasing redness,   swelling, or pain in or around a wound.  There is a red line that goes up your leg.  Pus is coming from a wound.  You develop a fever or as directed by your health care provider.  You notice a bad smell coming from an ulcer or wound. Document Released: 06/05/2000 Document Revised: 02/08/2013 Document Reviewed: 11/15/2012 Exit[employer] Patient Information 2014 [employer].  

## 2013-07-20 ENCOUNTER — Encounter (HOSPITAL_COMMUNITY): Payer: Self-pay

## 2013-07-20 ENCOUNTER — Ambulatory Visit (HOSPITAL_COMMUNITY)
Admission: RE | Admit: 2013-07-20 | Discharge: 2013-07-20 | Disposition: A | Discharge status: Home / Self Care | Payer: Medicaid Other | Source: Ambulatory Visit | Attending: Internal Medicine | Admitting: Internal Medicine

## 2013-07-20 VITALS — BP 92/52 | HR 75 | Resp 16 | Wt 201.5 lb

## 2013-07-20 DIAGNOSIS — I509 Heart failure, unspecified: Secondary | ICD-10-CM | POA: Insufficient documentation

## 2013-07-20 DIAGNOSIS — I5022 Chronic systolic (congestive) heart failure: Secondary | ICD-10-CM | POA: Insufficient documentation

## 2013-07-20 DIAGNOSIS — I2510 Atherosclerotic heart disease of native coronary artery without angina pectoris: Secondary | ICD-10-CM | POA: Insufficient documentation

## 2013-07-20 DIAGNOSIS — J449 Chronic obstructive pulmonary disease, unspecified: Secondary | ICD-10-CM

## 2013-07-20 NOTE — Patient Instructions (Signed)
Doing great.  Congratulations on quitting smoking!!! Keep up the good work.  Check with [facility] to see if they can provide transportation to Cardiac Rehab.  If your weight goes up more than 3 lbs in a day or 5 lbs in a week take an extra 40 mg (1 tablet) of lasix and 20 meq (1 tablet) of potassium chloride.  F/U2 months.  Do the following things EVERYDAY: 1) Weigh yourself in the morning before breakfast. Write it down and keep it in a log. 2) Take your medicines as prescribed 3) Eat low salt foods-Limit salt (sodium) to 2000 mg per day.  4) Stay as active as you can everyday 5) Limit all fluids for the day to less than 2 liters 6)

## 2013-07-20 NOTE — Progress Notes (Signed)
PCP: Dr. Heloise Purpura Jane[facility] Riggers)  HPI: [name] is a 63 yo with history of COPD and HTN admitted with ADHF in 11/14. Echo showed EF 20-25% with moderate RV dysfunction.   Cath with severe 3V CAD.Patient underwent CABG on 04/2013 with Dr. Roxan Hockey (left internal mammary artery to left anterior descending, saphenous vein graft to second diagonal, sequential saphenous vein graft to ramus intermedius and obtuse marginal 2, saphenous vein graft to posterior descending).  ECHO 06/26/13 EF 20-25% RV mild HK  Follow up: Doing well. Denies SOB, orthopnea, PND, edema or CP. Uses the grocery store cart to shop d/t leg weakness. Weighing daily 196-200 lbs. Trying to follow low salt diet and drinking less than 2L a day. Quit smoking 4 days ago!!!!!   ROS: All systems negative except as listed in HPI, PMH and Problem List.  Past Medical History  Diagnosis Date  . COPD (chronic obstructive pulmonary disease)   . Gout   . Emphysema   . Hypertension   . Hyperlipidemia   . Tobacco abuse     30+ pack-year history  . Diabetes mellitus without complication   . Chronic systolic CHF (congestive heart failure)     a. 04/2013 Echo: EF 15-20%, diff HK, sev HK of inf and ant myocardium, dilated LA, improved EF post cardiopulmonary bypass.  Marland Kitchen CAD (coronary artery disease)     a. 04/2013 Cath: LM 10, LAD 40p, 78m, D1 50p, LCX 60p, 6m/100m, OM1 50, OM2 100 L-L collats, RCA 100p;  b. CABG x 5: LIMA->LAD, VG->D2, VG->RI->OM2, VG->PDA.  . Transaminitis     a. 04/2013 Abd U/S and CT unremarkable, hepatitis panel neg -->felt to be 2/2 acute R heart failure.    Current Outpatient Prescriptions  Medication Sig Dispense Refill  . albuterol (PROVENTIL HFA;VENTOLIN HFA) 108 (90 BASE) MCG/ACT inhaler Inhale 2 puffs into the lungs every 4 (four) hours as needed. For shortness of breath       . allopurinol (ZYLOPRIM) 300 MG tablet Take 300 mg by mouth daily.      Marland Kitchen aspirin EC 325 MG EC tablet Take 1 tablet (325 mg total) by  mouth daily.  30 tablet  0  . b complex vitamins tablet Take 1 tablet by mouth daily.      . carvedilol (COREG) 3.125 MG tablet Take 3.125 mg in am and 6.25 mg in pm  90 tablet  3  . colchicine 0.6 MG tablet Take 0.6-1.2 mg by mouth daily. Take 2 tablets at the onset of gout symptoms, then take 1 tablet in an hour for a max of 3 tablets per 24 hours      . furosemide (LASIX) 40 MG tablet Take 1 tablet (40 mg total) by mouth 2 (two) times daily.  60 tablet  6  . gabapentin (NEURONTIN) 300 MG capsule Take 300 mg by mouth 2 (two) times daily.      Marland Kitchen lisinopril (PRINIVIL,ZESTRIL) 5 MG tablet Take 1 tablet (5 mg total) by mouth daily.  30 tablet  3  . lubiprostone (AMITIZA) 24 MCG capsule Take 24 mcg by mouth 2 (two) times daily with a meal.      . omeprazole (PRILOSEC) 20 MG capsule Take 20 mg by mouth daily.      . ondansetron (ZOFRAN) 4 MG tablet Take 4 mg by mouth every 6 (six) hours as needed for nausea or vomiting.      Marland Kitchen oxyCODONE (OXY IR/ROXICODONE) 5 MG immediate release tablet Take 1-2 tablets (5-10 mg total) by  mouth every 3 (three) hours as needed for severe pain.  30 tablet  0  . potassium chloride SA (K-DUR,KLOR-CON) 20 MEQ tablet Take 1 tablet (20 mEq total) by mouth daily.  30 tablet  6  . simvastatin (ZOCOR) 20 MG tablet Take 1 tablet (20 mg total) by mouth daily at 6 PM.  30 tablet  3  . spironolactone (ALDACTONE) 25 MG tablet Take 12.5 mg by mouth daily.      Marland Kitchen tiotropium (SPIRIVA) 18 MCG inhalation capsule Place 18 mcg into inhaler and inhale daily.       No current facility-administered medications for this encounter.    Filed Vitals:   07/20/13 1346  BP: 92/52  Pulse: 75  Resp: 16  Weight: 201 lb 8 oz (91.4 kg)  SpO2: 98%    PHYSICAL EXAM: General:  Well appearing. No resp difficulty HEENT: normal Neck: supple. JVP flat. Carotids 2+ bilaterally; no bruits. No lymphadenopathy or thryomegaly appreciated. Cor: PMI normal. Heart regular S1/S2, no S3/S4, 1/6 HSM  LLSB. Lungs: cear Abdomen: soft, nontender, nondistended. No hepatosplenomegaly. No bruits or masses. Good bowel sounds. Extremities: no cyanosis, clubbing, rash, edema Neuro: alert & orientedx3, cranial nerves grossly intact. Moves all 4 extremities w/o difficulty. Affect pleasant.   ASSESSMENT & PLAN:  1. CAD s/p recent CABG- Stable no s/s of ischemia. Continue aspirin and simvastatin. Needs to start cardiac rehab in [facility].  2. Chronic systolic HF: EF 37-85% (01/27/5026)  - NYHA II symptoms and volume status stable. Will continue lasix 40 mg BID and spironolactone 25 mg daily. - Continue coreg 3.125 mg q am and 6.25 mg qpm, will not titrate with hypotension. - Continue lisinopril 5 mg daily.  - Reinforced the need and importance of daily weights, a low sodium diet, and fluid restriction (less than 2 L a day). Instructed to call the HF clinic if weight increases more than 3 lbs overnight or 5 lbs in a week.  - Will need repeat ECHO in 3 months to reassess EF if remains less than 35% refer to EP for  3. COPD -Quit moking 4 days ago and congratulated patient on this success and told to continue.    F/U 2 months Junie Bame B NP-C 12:11 PM

## 2013-07-30 ENCOUNTER — Other Ambulatory Visit: Payer: Self-pay | Admitting: Physician Assistant

## 2013-08-28 ENCOUNTER — Other Ambulatory Visit: Payer: Self-pay | Admitting: Physician Assistant

## 2013-09-11 ENCOUNTER — Other Ambulatory Visit: Payer: Self-pay | Admitting: Physician Assistant

## 2013-09-12 ENCOUNTER — Encounter: Payer: Self-pay | Admitting: Vascular Surgery

## 2013-09-13 ENCOUNTER — Inpatient Hospital Stay (HOSPITAL_COMMUNITY): Admission: RE | Admit: 2013-09-13 | Payer: Self-pay | Source: Ambulatory Visit

## 2013-09-13 ENCOUNTER — Ambulatory Visit: Payer: Self-pay | Admitting: Vascular Surgery

## 2013-09-18 ENCOUNTER — Other Ambulatory Visit: Payer: Self-pay | Admitting: Physician Assistant

## 2013-09-18 ENCOUNTER — Encounter (HOSPITAL_COMMUNITY): Payer: Self-pay

## 2013-09-19 ENCOUNTER — Encounter (HOSPITAL_COMMUNITY): Payer: Self-pay

## 2013-09-19 ENCOUNTER — Ambulatory Visit (HOSPITAL_COMMUNITY)
Admission: RE | Admit: 2013-09-19 | Discharge: 2013-09-19 | Disposition: A | Discharge status: Home / Self Care | Payer: Medicaid Other | Source: Ambulatory Visit | Attending: Internal Medicine | Admitting: Internal Medicine

## 2013-09-19 ENCOUNTER — Telehealth (HOSPITAL_COMMUNITY): Payer: Self-pay

## 2013-09-19 VITALS — BP 112/74 | HR 81 | Wt 211.4 lb

## 2013-09-19 DIAGNOSIS — F17200 Nicotine dependence, unspecified, uncomplicated: Secondary | ICD-10-CM | POA: Insufficient documentation

## 2013-09-19 DIAGNOSIS — I5022 Chronic systolic (congestive) heart failure: Secondary | ICD-10-CM | POA: Insufficient documentation

## 2013-09-19 DIAGNOSIS — I2510 Atherosclerotic heart disease of native coronary artery without angina pectoris: Secondary | ICD-10-CM | POA: Insufficient documentation

## 2013-09-19 LAB — BASIC METABOLIC PANEL
BUN: 23 mg/dL (ref 6–23)
CHLORIDE: 101 meq/L (ref 96–112)
CO2: 23 mEq/L (ref 19–32)
Calcium: 9.9 mg/dL (ref 8.4–10.5)
Creatinine, Ser: 1.15 mg/dL (ref 0.50–1.35)
GFR, EST AFRICAN AMERICAN: 77 mL/min — AB (ref >90)
GFR, EST NON AFRICAN AMERICAN: 66 mL/min — AB (ref >90)
Glucose, Bld: 92 mg/dL (ref 70–99)
POTASSIUM: 4.2 meq/L (ref 3.7–5.3)
SODIUM: 138 meq/L (ref 137–147)

## 2013-09-19 MED ORDER — LISINOPRIL 2.5 MG PO TABS: 2.5000 mg | ORAL_TABLET | Freq: Every day | ORAL | Status: DC

## 2013-09-19 NOTE — Progress Notes (Signed)
Patient ID: [name] [name], male   DOB: [birth year], 63 y.o.   MRN: [id number] PCP: [name] [facility]) Cardiac Surgeon: Dr Roxan Hockey   HPI: [name] [name] is a 63 yo with history of COPD and HTN admitted with ADHF in 11/14. Echo showed EF 20-25% with moderate RV dysfunction.   Cath with severe 3V CAD.Patient underwent CABG on 04/2013 with Dr. Roxan Hockey (left internal mammary artery to left anterior descending, saphenous vein graft to second diagonal, sequential saphenous vein graft to ramus intermedius and obtuse marginal 2, saphenous vein graft to posterior descending).  ECHO 06/26/13 EF 20-25% RV mild HK  He returns for follow up.  Since last visit lisinopril was stopped in February by his PCP but he is not sure why it was stopped. Denies SOB, orthopnea, PND, edema or CP. Able to walk around grocery store but takes breaks. Weighing daily 200-207 pounds. Ambulates with cane. Trying to follow low salt diet and drinking less than 2L a day. Smoking a pack of cigarettes every other day. Taking all medications. He is attending cardiac rehab at [facility]. Transported to appointments via [facility].    ROS: All systems negative except as listed in HPI, PMH and Problem List.  Past Medical History  Diagnosis Date  . COPD (chronic obstructive pulmonary disease)   . Gout   . Emphysema   . Hypertension   . Hyperlipidemia   . Tobacco abuse     30+ pack-year history  . Diabetes mellitus without complication   . Chronic systolic CHF (congestive heart failure)     a. 04/2013 Echo: EF 15-20%, diff HK, sev HK of inf and ant myocardium, dilated LA, improved EF post cardiopulmonary bypass.  Marland Kitchen CAD (coronary artery disease)     a. 04/2013 Cath: LM 10, LAD 40p, 67m, D1 50p, LCX 60p, 84m/100m, OM1 50, OM2 100 L-L collats, RCA 100p;  b. CABG x 5: LIMA->LAD, VG->D2, VG->RI->OM2, VG->PDA.  . Transaminitis     a. 04/2013 Abd U/S and CT unremarkable, hepatitis panel neg -->felt to be 2/2 acute R heart failure.     Current Outpatient Prescriptions  Medication Sig Dispense Refill  . albuterol (PROVENTIL HFA;VENTOLIN HFA) 108 (90 BASE) MCG/ACT inhaler Inhale 2 puffs into the lungs every 4 (four) hours as needed. For shortness of breath       . allopurinol (ZYLOPRIM) 300 MG tablet Take 300 mg by mouth daily.      Marland Kitchen aspirin EC 325 MG EC tablet Take 1 tablet (325 mg total) by mouth daily.  30 tablet  0  . b complex vitamins tablet Take 1 tablet by mouth daily.      . carvedilol (COREG) 3.125 MG tablet Take 3.125 mg in am and 6.25 mg in pm  90 tablet  3  . furosemide (LASIX) 40 MG tablet Take 1 tablet (40 mg total) by mouth 2 (two) times daily.  60 tablet  6  . gabapentin (NEURONTIN) 300 MG capsule Take 300 mg by mouth 2 (two) times daily.      Marland Kitchen lubiprostone (AMITIZA) 24 MCG capsule Take 24 mcg by mouth 2 (two) times daily with a meal.      . omeprazole (PRILOSEC) 20 MG capsule Take 20 mg by mouth daily.      . ondansetron (ZOFRAN) 4 MG tablet Take 4 mg by mouth every 6 (six) hours as needed for nausea or vomiting.      Marland Kitchen oxyCODONE (OXY IR/ROXICODONE) 5 MG immediate release tablet Take 1-2 tablets (5-10 mg  total) by mouth every 3 (three) hours as needed for severe pain.  30 tablet  0  . potassium chloride SA (K-DUR,KLOR-CON) 20 MEQ tablet Take 1 tablet (20 mEq total) by mouth daily.  30 tablet  6  . simvastatin (ZOCOR) 20 MG tablet Take 1 tablet (20 mg total) by mouth daily at 6 PM.  30 tablet  3  . spironolactone (ALDACTONE) 25 MG tablet Take 12.5 mg by mouth daily.      Marland Kitchen tiotropium (SPIRIVA) 18 MCG inhalation capsule Place 18 mcg into inhaler and inhale daily.      . colchicine 0.6 MG tablet Take 0.6-1.2 mg by mouth daily. Take 2 tablets at the onset of gout symptoms, then take 1 tablet in an hour for a max of 3 tablets per 24 hours       No current facility-administered medications for this encounter.    Filed Vitals:   09/19/13 0922  BP: 112/74  Pulse: 81  Weight: 211 lb 6.4 oz (95.89 kg)   SpO2: 97%    PHYSICAL EXAM: General:  Well appearing. No resp difficulty. Arrived in wheelchair  HEENT: normal Neck: supple. JVP 5-6. Carotids 2+ bilaterally; no bruits. No lymphadenopathy or thryomegaly appreciated. Cor: PMI normal. Heart regular S1/S2, no S3/S4, 1/6 HSM LLSB. Lungs: cear Abdomen: soft, nontender, nondistended. No hepatosplenomegaly. No bruits or masses. Good bowel sounds. Extremities: no cyanosis, clubbing, rash, edema Neuro: alert & orientedx3, cranial nerves grossly intact. Moves all 4 extremities w/o difficulty. Affect pleasant.   ASSESSMENT & PLAN:  1. CAD s/p recent CABG- Stable no s/s of ischemia. Continue aspirin and simvastatin. Continue cardiac rehab at [facility]. .  2. Chronic systolic HF: EF 15% (02/23/5858)  - NYHA II symptoms. Volume status stable. Continue lasix 40 mg BID and spironolactone 12.5  mg daily. - Continue coreg 3.125 mg q am and 6.25 mg qpm. Will not titrate because I am adding low dose lisinopril again.  - Lisinopril was stopped 07/2012 by PCP due to hypotension. I contacted PCP [name] to confirm. Add  Lisinopril 2.5 mg at bed time. Plan to check ECHO at next visit. If EF < 25% will need to refer for ICD. He has had poor tolerance with HF med titration due to hypotension.  Check BMET at next visit.  - Reinforced the need and importance of daily weights, a low sodium diet, and fluid restriction (less than 2 L a day). Instructed to call the HF clinic if weight increases more than 3 lbs overnight or 5 lbs in a week.  3. Current Smoker-  Encouraged to stop smoking. Declines smoking cessation.    Follow up in 2-3 weeks with an ECHO.    Beatrice Ziehm NP-C 9:46 AM

## 2013-09-19 NOTE — Telephone Encounter (Signed)
Nurse Nira Conn with [name] ([facility] called regarding question of lisinopril which was held 1-2 months ago.  Per nurse, home health called their offices concerning SBP 80-90s for few visits consistently.  Lisinopril was stopped for this reason.

## 2013-09-19 NOTE — Patient Instructions (Signed)
Follow up in 3 weeks with ECHO  Take lisinopril 2.5 mg at bed time  Do the following things EVERYDAY: 1) Weigh yourself in the morning before breakfast. Write it down and keep it in a log. 2) Take your medicines as prescribed 3) Eat low salt foods-Limit salt (sodium) to 2000 mg per day.  4) Stay as active as you can everyday 5) Limit all fluids for the day to less than 2 liters

## 2013-10-10 ENCOUNTER — Ambulatory Visit (HOSPITAL_BASED_OUTPATIENT_CLINIC_OR_DEPARTMENT_OTHER)
Admission: RE | Admit: 2013-10-10 | Discharge: 2013-10-10 | Disposition: A | Discharge status: Home / Self Care | Payer: Medicaid Other | Source: Ambulatory Visit | Attending: Physician Assistant | Admitting: Physician Assistant

## 2013-10-10 ENCOUNTER — Ambulatory Visit (HOSPITAL_COMMUNITY)
Admission: RE | Admit: 2013-10-10 | Discharge: 2013-10-10 | Disposition: A | Discharge status: Home / Self Care | Payer: Medicaid Other | Source: Ambulatory Visit | Attending: Internal Medicine | Admitting: Internal Medicine

## 2013-10-10 VITALS — BP 104/72 | HR 83 | Wt 207.0 lb

## 2013-10-10 DIAGNOSIS — I5022 Chronic systolic (congestive) heart failure: Secondary | ICD-10-CM | POA: Diagnosis not present

## 2013-10-10 DIAGNOSIS — I509 Heart failure, unspecified: Secondary | ICD-10-CM | POA: Insufficient documentation

## 2013-10-10 DIAGNOSIS — I059 Rheumatic mitral valve disease, unspecified: Secondary | ICD-10-CM

## 2013-10-10 MED ORDER — LISINOPRIL 2.5 MG PO TABS: 2.5000 mg | ORAL_TABLET | Freq: Every day | ORAL | Status: DC

## 2013-10-10 NOTE — Progress Notes (Signed)
Patient ID: [name] [name], male   DOB: 1950/11/11, 63 y.o.   MRN: [id number] PCP: [name] [facility]) Cardiac Surgeon: Dr Roxan Hockey   HPI: [name] is a 63 yo with history of COPD and HTN admitted with ADHF in 11/14. Echo showed EF 20-25% with moderate RV dysfunction.   Cath with severe 3V CAD.Patient underwent CABG on 04/2013 with Dr. Roxan Hockey (left internal mammary artery to left anterior descending, saphenous vein graft to second diagonal, sequential saphenous vein graft to ramus intermedius and obtuse marginal 2, saphenous vein graft to posterior descending).  ECHO 06/26/13 EF 20-25% RV mild HK  He returns for follow up.  At last visit lisinopril 2.5 added back. (was stopped in 2/15 by CP due to hypotension) but he never picked up from pharmacy. He is tolerating well. No dizziness or presyncope.  Denies SOB, orthopnea, PND, edema or CP. Able to walk around grocery store but takes breaks. Weighing daily 200-207 pounds. Ambulates with cane. Trying to follow low salt diet and drinking less than 2L a day. Trying to quit smoking - has cut way back. After eating feels bloated but it goes away. Denies ab pain. Taking all medications. He is attending cardiac rehab at [place] to appointments via [facility].   Echo today EF 20-25% restrictive filling pattern. Moderate RV dysfunction. Mild MR.  ROS: All systems negative except as listed in HPI, PMH and Problem List.  Past Medical History  Diagnosis Date  . COPD (chronic obstructive pulmonary disease)   . Gout   . Emphysema   . Hypertension   . Hyperlipidemia   . Tobacco abuse     30+ pack-year history  . Diabetes mellitus without complication   . Chronic systolic CHF (congestive heart failure)     a. 04/2013 Echo: EF 15-20%, diff HK, sev HK of inf and ant myocardium, dilated LA, improved EF post cardiopulmonary bypass.  Marland Kitchen CAD (coronary artery disease)     a. 04/2013 Cath: LM 10, LAD 40p, 73m, D1 50p, LCX 60p, 61m/100m, OM1  50, OM2 100 L-L collats, RCA 100p;  b. CABG x 5: LIMA->LAD, VG->D2, VG->RI->OM2, VG->PDA.  . Transaminitis     a. 04/2013 Abd U/S and CT unremarkable, hepatitis panel neg -->felt to be 2/2 acute R heart failure.    Current Outpatient Prescriptions  Medication Sig Dispense Refill  . albuterol (PROVENTIL HFA;VENTOLIN HFA) 108 (90 BASE) MCG/ACT inhaler Inhale 2 puffs into the lungs every 4 (four) hours as needed. For shortness of breath       . allopurinol (ZYLOPRIM) 300 MG tablet Take 300 mg by mouth daily.      Marland Kitchen aspirin EC 325 MG EC tablet Take 1 tablet (325 mg total) by mouth daily.  30 tablet  0  . b complex vitamins tablet Take 1 tablet by mouth daily.      . carvedilol (COREG) 3.125 MG tablet Take 3.125 mg in am and 6.25 mg in pm  90 tablet  3  . colchicine 0.6 MG tablet Take 0.6-1.2 mg by mouth daily. Take 2 tablets at the onset of gout symptoms, then take 1 tablet in an hour for a max of 3 tablets per 24 hours      . furosemide (LASIX) 40 MG tablet Take 1 tablet (40 mg total) by mouth 2 (two) times daily.  60 tablet  6  . gabapentin (NEURONTIN) 300 MG capsule Take 300 mg by mouth 2 (two) times daily.      Marland Kitchen lubiprostone (AMITIZA)  24 MCG capsule Take 24 mcg by mouth 2 (two) times daily with a meal.      . omeprazole (PRILOSEC) 20 MG capsule Take 20 mg by mouth daily.      . potassium chloride SA (K-DUR,KLOR-CON) 20 MEQ tablet Take 1 tablet (20 mEq total) by mouth daily.  30 tablet  6  . simvastatin (ZOCOR) 20 MG tablet Take 1 tablet (20 mg total) by mouth daily at 6 PM.  30 tablet  3  . spironolactone (ALDACTONE) 25 MG tablet Take 12.5 mg by mouth daily.      Marland Kitchen tiotropium (SPIRIVA) 18 MCG inhalation capsule Place 18 mcg into inhaler and inhale daily.       No current facility-administered medications for this encounter.    Filed Vitals:   10/10/13 1032  BP: 104/72  Pulse: 83  Weight: 207 lb (93.895 kg)  SpO2: 99%    PHYSICAL EXAM: General:  Elderly. No resp difficulty. Walks  with cane. HEENT: normal Neck: supple. JVP 5-6. Carotids 2+ bilaterally; no bruits. No lymphadenopathy or thryomegaly appreciated. Cor: PMI normal. Heart regular S1/S2, no S3/S4, 1/6 HSM LLSB. Lungs: clear with mildly decreased breath sounds throughout Abdomen: soft, nontender, nondistended. No hepatosplenomegaly. No bruits or masses. Good bowel sounds. Extremities: no cyanosis, clubbing, rash, edema Neuro: alert & orientedx3, cranial nerves grossly intact. Moves all 4 extremities w/o difficulty. Affect pleasant.   ASSESSMENT & PLAN:  1. CAD s/p recent CABG- Stable no s/s of ischemia. Continue aspirin and simvastatin. Continue cardiac rehab at [facility] .  2. Chronic systolic HF: EF 35% (0/0/9381)  - NYHA II-III symptoms. Volume status stable. Continue lasix 40 mg BID and spironolactone 12.5  mg daily. - Continue coreg 3.125 mg q am and 6.25 mg qpm.  - Will attempt to resume lisinopril 2.5 qhs. Stop if develops dizziness  - Consider titrating carvedilol to 6.25 bid and/or adding digoxin 0.125 at next visit.  - I reviewed echo personally  EF remains ~ 25% will refer for ICD. QRS 122ms but not LBBB pattern. Will defer to Dr. Caryl Comes regarding appropriateness of CRT.  (Would request [employer] device for Optivol capabilities) --May need CPX  - Reinforced the need and importance of daily weights, a low sodium diet, and fluid restriction (less than 2 L a day). Instructed to call the HF clinic if weight increases more than 3 lbs overnight or 5 lbs in a week.  3. Current Smoker-  Encouraged to stop smoking.    Follow up in 3-4 weeks for ongoing med titration efforts.  Jolaine Artist MD  11:06 AM

## 2013-10-10 NOTE — Progress Notes (Signed)
Quick Note:  Dr Haroldine Laws reviewed and discussed ECHO results ______

## 2013-10-10 NOTE — Patient Instructions (Signed)
Start Lisinopril 2.5 mg at bedtime, prescription has been sent to [facility]  You have been referred to Dr Caryl Comes on [facility] 5/7 at 3:45 pm, [place]. 3rd floor [phone]  Your physician recommends that you schedule a follow-up appointment in: 3-4 weeks

## 2013-10-10 NOTE — Progress Notes (Signed)
  Echocardiogram 2D Echocardiogram has been performed.  [name] [name] 10/10/2013, 10:17 AM

## 2013-10-16 ENCOUNTER — Other Ambulatory Visit (HOSPITAL_COMMUNITY): Payer: Self-pay | Admitting: Adult Health

## 2013-10-17 ENCOUNTER — Ambulatory Visit: Payer: Medicaid Other | Admitting: Podiatrist

## 2013-10-26 ENCOUNTER — Ambulatory Visit (INDEPENDENT_AMBULATORY_CARE_PROVIDER_SITE_OTHER): Payer: Medicaid Other | Admitting: Internal Medicine

## 2013-10-26 ENCOUNTER — Encounter: Payer: Self-pay | Admitting: *Deleted

## 2013-10-26 ENCOUNTER — Encounter: Payer: Self-pay | Admitting: Internal Medicine

## 2013-10-26 VITALS — BP 141/86 | HR 88 | Resp 16 | Ht 67.0 in | Wt 214.0 lb

## 2013-10-26 DIAGNOSIS — R0989 Other specified symptoms and signs involving the circulatory and respiratory systems: Secondary | ICD-10-CM

## 2013-10-26 DIAGNOSIS — Z01812 Encounter for preprocedural laboratory examination: Secondary | ICD-10-CM

## 2013-10-26 DIAGNOSIS — I5022 Chronic systolic (congestive) heart failure: Secondary | ICD-10-CM

## 2013-10-26 DIAGNOSIS — R0689 Other abnormalities of breathing: Secondary | ICD-10-CM

## 2013-10-26 DIAGNOSIS — I2589 Other forms of chronic ischemic heart disease: Secondary | ICD-10-CM

## 2013-10-26 DIAGNOSIS — I255 Ischemic cardiomyopathy: Secondary | ICD-10-CM

## 2013-10-26 MED ORDER — ASPIRIN EC 81 MG PO TBEC: 81.0000 mg | DELAYED_RELEASE_TABLET | Freq: Every day | ORAL | Status: DC

## 2013-10-26 NOTE — Progress Notes (Signed)
ELECTROPHYSIOLOGY CONSULT NOTE  Patient ID: [name], MRN: [id number], DOB/AGE: 62-Dec-1952 62 y.o. Admit date: (Not on file) Date of Consult: 10/26/2013  Primary Physician: Fae Pippin Primary Cardiologist: CHF  Chief Complaint: ICD   HPI [name] is a 63 y.o. male  Referred from the heart failure clinic for consideration of ICD implantation.  Is a history of ischemic heart disease and underwent bypass surgery 11/14. He has significant left ventricular dysfunction which has not improved not withstanding guideline directed medical therapy. Echocardiogram 4/15 remains 20-25%.  Hypotension is precluded significant up titration of his medications.  He has modest DOE he does not have nocturnal dyspnea or orthopnea.   he has not had syncope or palpitations.   Past Medical History  Diagnosis Date  . COPD (chronic obstructive pulmonary disease)   . Gout   . Emphysema   . Hypertension   . Hyperlipidemia   . Tobacco abuse     30+ pack-year history  . Diabetes mellitus without complication   . Chronic systolic CHF (congestive heart failure)     a. 04/2013 Echo: EF 15-20%, diff HK, sev HK of inf and ant myocardium, dilated LA, improved EF post cardiopulmonary bypass.  Marland Kitchen CAD (coronary artery disease)     a. 04/2013 Cath: LM 10, LAD 40p, 77m, D1 50p, LCX 60p, 32m/100m, OM1 50, OM2 100 L-L collats, RCA 100p;  b. CABG x 5: LIMA->LAD, VG->D2, VG->RI->OM2, VG->PDA.  . Transaminitis     a. 04/2013 Abd U/S and CT unremarkable, hepatitis panel neg -->felt to be 2/2 acute R heart failure.      Surgical History:  Past Surgical History  Procedure Laterality Date  . Appendectomy  as a child  . Multiple extractions with alveoloplasty N/A 05/03/2013    Procedure: Extraction of tooth #s 3,4,5,6,8,9,27 with alveoloplast and maxillary left osseous tuberosity reduction;  Surgeon: Lenn Cal, DDS;  Location: [facility];  Service: Oral Surgery;  Laterality: N/A;  . Coronary artery  bypass graft N/A 05/05/2013    Procedure: CORONARY ARTERY BYPASS GRAFTING (CABG) TIMES FIVE USING LEFT INTERNAL MAMMARY ARTERY AND RIGHT AND LEFT SAPHENOUS LEG VEIN HARVESTED ENDOSCOPICALLY;  Surgeon: Melrose Nakayama, MD;  Location: [facility];  Service: Open Heart Surgery;  Laterality: N/A;     Home Meds: Prior to Admission medications   Medication Sig Start Date End Date Taking? Authorizing Provider  albuterol (PROVENTIL HFA;VENTOLIN HFA) 108 (90 BASE) MCG/ACT inhaler Inhale 2 puffs into the lungs every 4 (four) hours as needed. For shortness of breath    Yes Historical Provider, MD  allopurinol (ZYLOPRIM) 300 MG tablet Take 300 mg by mouth daily.   Yes Historical Provider, MD  aspirin EC 325 MG EC tablet Take 1 tablet (325 mg total) by mouth daily. 05/11/13  Yes Erin Barrett, PA-C  b complex vitamins tablet Take 1 tablet by mouth daily.   Yes Historical Provider, MD  carvedilol (COREG) 3.125 MG tablet TAKE 3.125 MG IN AM AND 6.25 MG IN PM   Yes Jolaine Artist, MD  colchicine 0.6 MG tablet Take 0.6-1.2 mg by mouth daily. Take 2 tablets at the onset of gout symptoms, then take 1 tablet in an hour for a max of 3 tablets per 24 hours   Yes Historical Provider, MD  furosemide (LASIX) 40 MG tablet Take 1 tablet (40 mg total) by mouth 2 (two) times daily. 05/11/13  Yes Amy D Clegg, NP  gabapentin (NEURONTIN) 300 MG capsule Take 300 mg by mouth  2 (two) times daily.   Yes Historical Provider, MD  ipratropium-albuterol (DUONEB) 0.5-2.5 (3) MG/3ML SOLN 3 mLs.   Yes Historical Provider, MD  lisinopril (PRINIVIL,ZESTRIL) 2.5 MG tablet Take 1 tablet (2.5 mg total) by mouth at bedtime. 10/10/13  Yes Jolaine Artist, MD  lubiprostone (AMITIZA) 24 MCG capsule Take 24 mcg by mouth 2 (two) times daily with a meal.   Yes Historical Provider, MD  nitroGLYCERIN (NITROSTAT) 0.4 MG SL tablet Place 0.4 mg under the tongue.   Yes Historical Provider, MD  Olopatadine HCl (PATADAY) 0.2 % SOLN Apply 1 drop to eye.    Yes Historical Provider, MD  omeprazole (PRILOSEC) 20 MG capsule Take 20 mg by mouth daily.   Yes Historical Provider, MD  ondansetron (ZOFRAN) 4 MG tablet Take 4 mg by mouth.   Yes Historical Provider, MD  potassium chloride SA (K-DUR,KLOR-CON) 20 MEQ tablet Take 1 tablet (20 mEq total) by mouth daily. 05/19/13  Yes Rogelia Mire, NP  simvastatin (ZOCOR) 20 MG tablet Take 1 tablet (20 mg total) by mouth daily at 6 PM. 05/11/13  Yes Erin Barrett, PA-C  spironolactone (ALDACTONE) 25 MG tablet Take 12.5 mg by mouth daily.   Yes Historical Provider, MD  tiotropium (SPIRIVA) 18 MCG inhalation capsule Place 18 mcg into inhaler and inhale daily.   Yes Historical Provider, MD    Allergies:  Allergies  Allergen Reactions  . Penicillins Other (See Comments)    Swelling , long time ago.     History   Social History  . Marital Status: Single    Spouse Name: N/A    Number of Children: N/A  . Years of Education: N/A   Occupational History  . Retired    Social History Main Topics  . Smoking status: Current Every Day Smoker -- 1.00 packs/day for 30 years    Last Attempt to Quit: 05/11/2011  . Smokeless tobacco: Never Used     Comment: has had a few cigarettes since discharge after CABG.  Marland Kitchen Alcohol Use: No  . Drug Use: No  . Sexual Activity: Not on file   Other Topics Concern  . Not on file   Social History Narrative   Lives in [place] with his girlfriend and her daughter.       Family History  Problem Relation Age of Onset  . Diabetes Mother   . Hypertension    . Heart attack      Brothers     ROS:  Please see the history of present illness. Arthritis   All other systems reviewed and negative.    Physical Exam   Blood pressure 141/86, pulse 88, resp. rate 16, height 5\' 7"  (1.702 m), weight 214 lb (97.07 kg), SpO2 99.00%. General: Well developed, well nourished male in no acute distress. Head: Normocephalic, atraumatic, sclera non-icteric, no xanthomas, nares are  without discharge. EENT: normal Lymph Nodes:  none Back: without scoliosis/kyphosis, no CVA tendersness Neck: Negative for carotid bruits. JVD not elevated. Lungs: Markedly decreased breath sounds on the left all the way of  Heart: RRR with S1 S2. 2/6 murmur , rubs, or gallops appreciated. Abdomen: Soft, non-tender, non-distended with normoactive bowel sounds. No hepatomegaly. No rebound/guarding. No obvious abdominal masses. Msk:  Strength and tone appear normal for age. Extremities: No clubbing or cyanosis. No  edema.  Distal pedal pulses are 2+ and equal bilaterally. Skin: Warm and Dry Neuro: Alert and oriented X 3. CN III-XII intact Grossly normal sensory and motor function . Psych:  Responds to questions appropriately with a normal affect.      Labs: Cardiac Enzymes No results found for this basename: CKTOTAL, CKMB, TROPONINI,  in the last 72 hours CBC Lab Results  Component Value Date   WBC 8.9 05/29/2013   HGB 10.8* 05/29/2013   HCT 33.8* 05/29/2013   MCV 83.7 05/29/2013   PLT 256 05/29/2013   PROTIME: No results found for this basename: LABPROT, INR,  in the last 72 hours Chemistry No results found for this basename: NA, K, CL, CO2, BUN, CREATININE, CALCIUM, LABALBU, PROT, BILITOT, ALKPHOS, ALT, AST, GLUCOSE,  in the last 168 hours Lipids Lab Results  Component Value Date   CHOL 98 05/01/2013   HDL 31* 05/01/2013   LDLCALC 53 05/01/2013   TRIG 71 05/01/2013   BNP Pro B Natriuretic peptide (BNP)  Date/Time Value Ref Range Status  04/22/2013  1:59 PM 2616.0* 0 - 125 pg/mL Final  05/20/2011  9:05 AM 368.9* 0 - 125 pg/mL Final  05/17/2011  8:10 AM 1672.0* 0 - 125 pg/mL Final  07/02/2010  8:22 AM 250.0* 0.0 - 100.0 pg/mL Final   Miscellaneous No results found for this basename: DDIMER    Radiology/Studies:  No results found.  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 barnhart is on going problems with congestive heart failure-class  3 in the setting of ischemic heart disease with maximal medical therapy up titration of which has been limited by hypotension. Despite this his ejection fraction remains 20-25% and as such is appropriate to consider ICD therapy. His benefits may be attenuated in part by his right navicular dysfunction.  He also has right bundle left anterior fascicular block with a QRS duration of greater than 150 ms. Given his heart failure symptoms this is a class II A indication for CRT again with benefit likelihood attenuated by right bundle branch block instead of left in the presence of right ventricular dysfunction.  He has absent breath sounds on the left. Last chest x-ray was 12/14 which noted no abnormality. We'll repeat a chest x-ray  Blood work 3/15 was normal.   Deboraha Sprang

## 2013-10-26 NOTE — Patient Instructions (Addendum)
Your physician has recommended you make the following change in your medication:  1) DECREASE Aspirin to 81 mg daily  A chest x-ray takes a picture of the organs and structures inside the chest, including the heart, lungs, and blood vessels. This test can show several things, including, whether the heart is enlarges; whether fluid is building up in the lungs; and whether pacemaker / defibrillator leads are still in place.  CRT or cardiac resynchronization therapy is a treatment used to correct heart failure. When you have heart failure your heart is weakened and doesn't pump as well as it should. This therapy may help reduce symptoms and improve the quality of life.  Please see the handout/brochure given to you today to get more information of the different options of therapy.  Syris Brookens, RN will call you to schedule this -- [phone]  Biventricular Pacemaker Implantation A pacemaker is a small, lightweight, battery-powered device that is placed (implanted) under the skin in the upper chest. Your caregiver may prescribe a pacemaker for you if your heartbeat is too slow (bradycardia). A biventricular pacemaker is a pulse generator connected by wires called leads that go into the two lower chambers on the right and left sides of your heart (ventricles). It is used to treat symptoms of heart failure. The pulse generator is a small computer run by a battery. The generator creates a regular electronic pulse. The pulse is sent through the leads, which go through a blood vessel and into the ventricles of your heart. This type of pacemaker makes a weak heart more efficient. LET YOUR CAREGIVER KNOW ABOUT  Any allergies. Some allergies can cause serious problems during the procedure. Allergies to shellfish or agents, such as iodine, used in liquids that enhance specific areas of your body on X-ray images (contrast dyes) are especially problematic.   All medicines you take. These include vitamins, herbs, eyedrops,  over-the-counter medicines, and creams.   Use of steroids.   Problems with numbing medicines (anesthetics).   Bleeding problems.   Past surgeries.   Other health problems. RISKS AND COMPLICATIONS Implanting a biventricular pacemaker is usually a safe procedure but problems can occur. For example:   Too much bleeding may occur.   Infection may develop.   Blood vessels, your lungs, or your heart may be harmed.   The pacemaker may not make your condition better. BEFORE THE PROCEDURE   You may need to have blood tests, heart tests, or a chest X-ray done before the day of the procedure.   Ask your caregiver about changing or stopping your regular medicines.   Make plans to have someone drive you home. You will usually need to stay in the hospital overnight after the procedure.   Stop smoking at least 24 hours before the procedure.   Take a bath or shower the night before the procedure. You may need to scrub your chest with a special type of soap.   Do not eat or drink anything after midnight the night before your procedure. Ask if it is okay to take any needed medicine with a small sip of water. PROCEDURE The procedure to put a pacemaker in your chest is usually done at a hospital in a room that has a large X-ray machine called a fluoroscope. The machine will be above you during the procedure. It will help your doctor see your heart during the procedure. Implanting a biventricular pacemaker usually takes 2 5 hours. Before the procedure:   Small monitors will be put on  your body. They will be used to check your heart, blood pressure, and oxygen level.   A needle will be put into a vein in your hand or arm. This is called an intravenous (IV) access tube. Fluids and medicine will flow directly into your body through the IV tube.   Your chest will be cleaned with a germ-killing (antiseptic) solution. Your chest may be shaved.   You may be given medicine to help you  relax (sedative).   You will be given a numbing medicine called a local anesthetic. This medicine will make your chest area have no feeling while the pacemaker is implanted. You will be sleepy but awake during the procedure. After you are numb the procedure will begin. The caregiver will:   Make a small cut (incision). This will make a pocket deep under your skin that will hold the pulse generator.   Guide the leads through a large blood vessel into your heart and attach them to the heart muscles.   Test the pacemaker.   Close the incision with stitches, glue, or staples. AFTER THE PROCEDURE  You may feel pain. Some pain is normal. It may last a few days.   You may stay in a recovery area until the local anesthetic has worn off. Your blood pressure and pulse will be checked often. You will be taken to a room where your heart beat will be monitored.   A chest X-ray will be taken. This checks that the pacemaker is in the right place.   You may stay in the hospital overnight.   The pacemaker will be checked before you go home. It can be adjusted if that is needed. Document Released: 03/02/2012 Document Reviewed: 03/02/2012 [facility] Patient Information 2014 [place], [place].

## 2013-10-30 ENCOUNTER — Other Ambulatory Visit (HOSPITAL_COMMUNITY): Payer: Self-pay | Admitting: Adult Health

## 2013-11-02 ENCOUNTER — Ambulatory Visit (HOSPITAL_COMMUNITY)
Admission: RE | Admit: 2013-11-02 | Discharge: 2013-11-02 | Disposition: A | Discharge status: Home / Self Care | Payer: Medicaid Other | Source: Ambulatory Visit | Attending: Internal Medicine | Admitting: Internal Medicine

## 2013-11-02 ENCOUNTER — Encounter (HOSPITAL_COMMUNITY): Payer: Self-pay

## 2013-11-02 VITALS — BP 117/83 | HR 86 | Resp 18 | Wt 209.0 lb

## 2013-11-02 DIAGNOSIS — Z951 Presence of aortocoronary bypass graft: Secondary | ICD-10-CM | POA: Insufficient documentation

## 2013-11-02 DIAGNOSIS — I700 Atherosclerosis of aorta: Secondary | ICD-10-CM | POA: Insufficient documentation

## 2013-11-02 DIAGNOSIS — J449 Chronic obstructive pulmonary disease, unspecified: Secondary | ICD-10-CM | POA: Insufficient documentation

## 2013-11-02 DIAGNOSIS — I509 Heart failure, unspecified: Secondary | ICD-10-CM | POA: Insufficient documentation

## 2013-11-02 DIAGNOSIS — F17200 Nicotine dependence, unspecified, uncomplicated: Secondary | ICD-10-CM

## 2013-11-02 DIAGNOSIS — J4489 Other specified chronic obstructive pulmonary disease: Secondary | ICD-10-CM | POA: Insufficient documentation

## 2013-11-02 DIAGNOSIS — I2510 Atherosclerotic heart disease of native coronary artery without angina pectoris: Secondary | ICD-10-CM

## 2013-11-02 DIAGNOSIS — J90 Pleural effusion, not elsewhere classified: Secondary | ICD-10-CM | POA: Insufficient documentation

## 2013-11-02 DIAGNOSIS — M109 Gout, unspecified: Secondary | ICD-10-CM

## 2013-11-02 DIAGNOSIS — R079 Chest pain, unspecified: Secondary | ICD-10-CM | POA: Insufficient documentation

## 2013-11-02 DIAGNOSIS — M47814 Spondylosis without myelopathy or radiculopathy, thoracic region: Secondary | ICD-10-CM | POA: Insufficient documentation

## 2013-11-02 DIAGNOSIS — I5022 Chronic systolic (congestive) heart failure: Secondary | ICD-10-CM

## 2013-11-02 DIAGNOSIS — R0689 Other abnormalities of breathing: Secondary | ICD-10-CM

## 2013-11-02 DIAGNOSIS — J9819 Other pulmonary collapse: Secondary | ICD-10-CM | POA: Insufficient documentation

## 2013-11-02 LAB — BASIC METABOLIC PANEL
BUN: 23 mg/dL (ref 6–23)
CHLORIDE: 100 meq/L (ref 96–112)
CO2: 26 mEq/L (ref 19–32)
Calcium: 9.7 mg/dL (ref 8.4–10.5)
Creatinine, Ser: 1.12 mg/dL (ref 0.50–1.35)
GFR calc Af Amer: 80 mL/min — ABNORMAL LOW (ref >90)
GFR calc non Af Amer: 69 mL/min — ABNORMAL LOW (ref >90)
GLUCOSE: 100 mg/dL — AB (ref 70–99)
POTASSIUM: 4 meq/L (ref 3.7–5.3)
Sodium: 137 mEq/L (ref 137–147)

## 2013-11-02 MED ORDER — CARVEDILOL 3.125 MG PO TABS: 6.2500 mg | ORAL_TABLET | Freq: Two times a day (BID) | ORAL | Status: DC

## 2013-11-02 NOTE — Progress Notes (Signed)
Patient ID: [name], male   DOB: [birth year], 63 y.o.   MRN: [id number] PCP: [name]) Cardiac Surgeon: [name]   HPI: [name] is [name] 63 yo with history of COPD, tobacco abuse,  and HTN admitted with ADHF in 11/14. Echo showed EF 20-25% with moderate RV dysfunction. Cath with severe 3V CAD.Patient underwent CABG on 04/2013 with [name]. Rox[name] (left internal mammary artery to left anterior descending, saphenous vein graft to second diagonal, sequential saphenous vein graft to ramus intermedius and obtuse marginal 2, saphenous vein graft to posterior descending).  ECHO 06/26/13 EF 20-25% RV mild HK ECHO 10/10/13 EF 20-25% restrictive filling pattern. Moderate RV dysfunction. Mild MR.  He returns for follow up. Last visit 2.5 mg lisinopril added at night. He was evaluated by [name] 5/715 and deemed appropriate for CRT-D. SOB when he is sweeping. SOB with steps. Denies dizziness/PND. + Orthopnea sleeps on 1-2 pillows. Weight at home 206-210 pounds.  Ambulates with [name] cane. Trying to follow low salt diet and drinking less than 2L [name] day. Smoking 7-8 cigarettes per day. Taking all medications. He is attending cardiac rehab at [name] 3 days [name] week. Says he has leg fatigue when exercises at rehab. Has ABI scheduled on 11/07/13. Transported to appointments via [name]. Has [name] to fill  Pill box.    ECHO 06/26/13 EF 20-25% RV mild HK ECHO 10/10/13 EF 20-25% restrictive filling pattern. Moderate RV dysfunction. Mild MR.  Labs 09/19/13 K 4.2 Creatinine 1.1  SH: Lives with his girlfriend [name] 7-8 cigarette per day. Does not drink alcohol FH: 2 brothers died from heart attacks, Mother DM, Dad HTN   ROS: All systems negative except as listed in HPI, PMH and Problem List.  Past Medical History  Diagnosis Date  . COPD (chronic obstructive pulmonary disease)   . Gout   . Emphysema   . Hypertension   . Hyperlipidemia   . Tobacco abuse     30+ pack-year history  . Diabetes  mellitus without complication   . Chronic systolic CHF (congestive heart failure)     [name]. 04/2013 Echo: EF 15-20%, diff HK, sev HK of inf and ant myocardium, dilated LA, improved EF post cardiopulmonary bypass.  Marland Kitchen CAD (coronary artery disease)     [name]. 04/2013 Cath: LM 10, LAD 40p, 44m, D1 50p, LCX 60p, 54m/100m, OM1 50, OM2 100 L-L collats, RCA 100p;  b. CABG x 5: LIMA->LAD, VG->D2, VG->RI->OM2, VG->PDA.  . Transaminitis     [name]. 04/2013 Abd U/S and CT unremarkable, hepatitis panel neg -->felt to be 2/2 acute R heart failure.    Current Outpatient Prescriptions  Medication Sig Dispense Refill  . albuterol (PROVENTIL HFA;VENTOLIN HFA) 108 (90 BASE) MCG/ACT inhaler Inhale 2 puffs into the lungs every 4 (four) hours as needed. For shortness of breath       . allopurinol (ZYLOPRIM) 300 MG tablet Take 300 mg by mouth daily.      Marland Kitchen aspirin 81 MG tablet Take 1 tablet (81 mg total) by mouth daily.  30 tablet  11  . b complex vitamins tablet Take 1 tablet by mouth daily.      . carvedilol (COREG) 3.125 MG tablet TAKE 3.125 MG IN AM AND 6.25 MG IN PM  90 tablet  3  . colchicine 0.6 MG tablet Take 0.6-1.2 mg by mouth daily. Take 2 tablets at the onset of gout symptoms, then take 1 tablet in an hour for [name] max of 3 tablets  per 24 hours      . furosemide (LASIX) 40 MG tablet Take 1 tablet (40 mg total) by mouth 2 (two) times daily.  60 tablet  6  . gabapentin (NEURONTIN) 300 MG capsule Take 300 mg by mouth 2 (two) times daily.      Marland Kitchen ipratropium-albuterol (DUONEB) 0.5-2.5 (3) MG/3ML SOLN 3 mLs.      Marland Kitchen lisinopril (PRINIVIL,ZESTRIL) 2.5 MG tablet Take 1 tablet (2.5 mg total) by mouth at bedtime.  30 tablet  3  . lubiprostone (AMITIZA) 24 MCG capsule Take 24 mcg by mouth 2 (two) times daily with [name] meal.      . nitroGLYCERIN (NITROSTAT) 0.4 MG SL tablet Place 0.4 mg under the tongue.      Marland Kitchen Olopatadine HCl (PATADAY) 0.2 % SOLN Apply 1 drop to [name].      Marland Kitchen omeprazole (PRILOSEC) 20 MG capsule Take 20 mg by mouth  daily.      . ondansetron (ZOFRAN) 4 MG tablet Take 4 mg by mouth every 8 (eight) hours as needed for nausea.       . potassium chloride SA (K-DUR,KLOR-CON) 20 MEQ tablet Take 1 tablet (20 mEq total) by mouth daily.  30 tablet  6  . simvastatin (ZOCOR) 20 MG tablet Take 1 tablet (20 mg total) by mouth daily at 6 PM.  30 tablet  3  . spironolactone (ALDACTONE) 25 MG tablet Take 12.5 mg by mouth daily.      Marland Kitchen tiotropium (SPIRIVA) 18 MCG inhalation capsule Place 18 mcg into inhaler and inhale daily.       No current facility-administered medications for this encounter.    Filed Vitals:   11/02/13 1349  BP: 117/83  Pulse: 86  Resp: 18  Weight: 209 lb (94.802 kg)  SpO2: 99%    PHYSICAL EXAM: General:  Elderly. No resp difficulty. Walks with cane. HEENT: normal Neck: supple. JVP 5-6. Carotids 2+ bilaterally; no bruits. No lymphadenopathy or thryomegaly appreciated. Cor: PMI normal. Heart regular S1/S2, no S3/S4, 1/6 HSM LLSB. Lungs: clear with mildly decreased breath sounds throughout Abdomen: soft, nontender, nondistended. No hepatosplenomegaly. No bruits or masses. Good bowel sounds. Extremities: no cyanosis, clubbing, rash, edema Neuro: alert & orientedx3, cranial nerves grossly intact. Moves all 4 extremities w/o difficulty. Affect pleasant.   ASSESSMENT & PLAN:  1. CAD s/p recent CABG- Stable no s/s of ischemia. Continue aspirin and simvastatin. Continue cardiac rehab at [name]. [name] 2. Chronic systolic HF: ECHO -->EF 42% 10/10/13 Evaluated by [name] with plan for CRT-D soon. If he has no functional improvement will need CPX.  - NYHA II-III symptoms. Volume status stable. Continue lasix 40 mg BID and spironolactone 12.5  mg daily. - Increase day time coreg to 6.25 mg daily and continue 6.25 mg in the evening.   - Continue lisinopril 2.5 qhs. Check BMET today.   - Consider digoxin at next visit.  - Reinforced the need and importance of daily weights, [name] low sodium diet,  and fluid restriction (less than 2 L [name] day). Instructed to call the HF clinic if weight increases more than 3 lbs overnight or 5 lbs in [name] week.  3. Current Smoker-  Encouraged to stop smoking.  4. Gout- Continue allopurinol.   Follow up in 4 weeks for ongoing med titration efforts.  Kamari Buch D Juleen Sorrels NP-C  1:58 PM

## 2013-11-02 NOTE — Patient Instructions (Signed)
Follow up in 2 weeks  Take carvedilol 6.25 mg twice a day  Do the following things EVERYDAY: 1) Weigh yourself in the morning before breakfast. Write it down and keep it in a log. 2) Take your medicines as prescribed 3) Eat low salt foods-Limit salt (sodium) to 2000 mg per day.  4) Stay as active as you can everyday 5) Limit all fluids for the day to less than 2 liters

## 2013-11-06 ENCOUNTER — Encounter: Payer: Self-pay | Admitting: Family

## 2013-11-07 ENCOUNTER — Ambulatory Visit (INDEPENDENT_AMBULATORY_CARE_PROVIDER_SITE_OTHER): Payer: Medicaid Other | Admitting: Family

## 2013-11-07 ENCOUNTER — Ambulatory Visit (HOSPITAL_COMMUNITY)
Admission: RE | Admit: 2013-11-07 | Discharge: 2013-11-07 | Disposition: A | Discharge status: Home / Self Care | Payer: Medicaid Other | Source: Ambulatory Visit | Attending: Family | Admitting: Family

## 2013-11-07 ENCOUNTER — Encounter: Payer: Self-pay | Admitting: Family

## 2013-11-07 VITALS — BP 102/70 | HR 74 | Resp 16 | Ht 67.5 in | Wt 216.0 lb

## 2013-11-07 DIAGNOSIS — E785 Hyperlipidemia, unspecified: Secondary | ICD-10-CM | POA: Insufficient documentation

## 2013-11-07 DIAGNOSIS — I70219 Atherosclerosis of native arteries of extremities with intermittent claudication, unspecified extremity: Secondary | ICD-10-CM | POA: Insufficient documentation

## 2013-11-07 DIAGNOSIS — R29898 Other symptoms and signs involving the musculoskeletal system: Secondary | ICD-10-CM | POA: Insufficient documentation

## 2013-11-07 DIAGNOSIS — M79609 Pain in unspecified limb: Secondary | ICD-10-CM

## 2013-11-07 DIAGNOSIS — I739 Peripheral vascular disease, unspecified: Secondary | ICD-10-CM

## 2013-11-07 DIAGNOSIS — E119 Type 2 diabetes mellitus without complications: Secondary | ICD-10-CM | POA: Insufficient documentation

## 2013-11-07 DIAGNOSIS — F17200 Nicotine dependence, unspecified, uncomplicated: Secondary | ICD-10-CM | POA: Insufficient documentation

## 2013-11-07 DIAGNOSIS — L98499 Non-pressure chronic ulcer of skin of other sites with unspecified severity: Secondary | ICD-10-CM

## 2013-11-07 DIAGNOSIS — I10 Essential (primary) hypertension: Secondary | ICD-10-CM | POA: Insufficient documentation

## 2013-11-07 NOTE — Progress Notes (Signed)
[facility] HISTORY AND PHYSICAL -PAD  History of Present Illness Graysin Luczynski is a 63 y.o. male patient of Dr. Scot Dock who developed a blister on both feet approximately Sept., 2014. The blister on the left foot healed. He was having a persistent wound in the right foot and was referred for vascular consultation. He did not remember any specific injury to the foot. Dr. Scot Dock did not get any clear-cut history of claudication although it sounded like his activity was fairly limited. He did describe some rest pain in the right foot. However, this seems to be improving as the wound on his foot has healed. He does have a history of tobacco use but is trying to quit currently.  At his Dec., 2014 visit with Dr. 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 However, the wound on the dorsum of his right foot had healed at that point. His symptoms in the right foot were improving. For this reason Dr. Scot Dock held off on arteriography. He has had veins taken from both legs for his CABG. Based on his duplex, it is unlikely that he would have disease amenable to angioplasty on the right as the disease is fairly diffuse. As the wound has healed, and his symptoms were improving,  it is safe to simply follow this closely with follow up visit in 3 months and see him back at that time. He has pain at posterior aspect of both knees after about 5 minutes walking on a treadmill, relieved by rest, he denies any recurring wounds, the past wounds on his right foot has healed. 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 He goes to cardiac rehab. 3 days/week, walks 1-3 miles daily. 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 He has CAD, denies any history of stroke or TIA.  Pt has not had previous intervention for PAD .  The patient reports New Medical or Surgical  History: that he is supposed to have a cardiac defibrillator placed, awaiting to hear when.  Pt Diabetic: Yes, A1C April, 2015 was 6.3 Pt smoker: smoker  (1/2 ppd, started smoking at age 71 yrs), he also gets second hand smoke from his girlfriend who smokes in the house  Pt meds include: Statin :Yes Betablocker: Yes ASA: Yes Other anticoagulants/antiplatelets: no  Past Medical History  Diagnosis Date  . COPD (chronic obstructive pulmonary disease)   . Gout   . Emphysema   . Hypertension   . Hyperlipidemia   . Tobacco abuse     30+ pack-year history  . Diabetes mellitus without complication   . Chronic systolic CHF (congestive heart failure)     a. 04/2013 Echo: EF 15-20%, diff HK, sev HK of inf and ant myocardium, dilated LA, improved EF post cardiopulmonary bypass.  Marland Kitchen CAD (coronary artery disease)     a. 04/2013 Cath: LM 10, LAD 40p, 15m, D1 50p, LCX 60p, 27m/100m, OM1 50, OM2 100 L-L collats, RCA 100p;  b. CABG x 5: LIMA->LAD, VG->D2, VG->RI->OM2, VG->PDA.  . Transaminitis     a. 04/2013 Abd U/S and CT unremarkable, hepatitis panel neg -->felt to be 2/2 acute R heart failure.    Social History History  Substance Use Topics  . Smoking status: Current Every Day Smoker -- 1.00 packs/day for 30 years    Last Attempt to Quit: 05/11/2011  . Smokeless tobacco: Never Used  Comment: has had a few cigarettes since discharge after CABG.  Marland Kitchen Alcohol Use: No    Family History Family History  Problem Relation Age of Onset  . Diabetes Mother   . Hypertension    . Heart attack      Brothers    Past Surgical History  Procedure Laterality Date  . Appendectomy  as a child  . Multiple extractions with alveoloplasty N/A 05/03/2013    Procedure: Extraction of tooth #s 3,4,5,6,8,9,27 with alveoloplast and maxillary left osseous tuberosity reduction;  Surgeon: Lenn Cal, DDS;  Location: [facility];  Service: Oral Surgery;  Laterality: N/A;  . Coronary artery bypass graft N/A  05/05/2013    Procedure: CORONARY ARTERY BYPASS GRAFTING (CABG) TIMES FIVE USING LEFT INTERNAL MAMMARY ARTERY AND RIGHT AND LEFT SAPHENOUS LEG VEIN HARVESTED ENDOSCOPICALLY;  Surgeon: Melrose Nakayama, MD;  Location: [facility];  Service: Open Heart Surgery;  Laterality: N/A;    Allergies  Allergen Reactions  . Penicillins Other (See Comments)    Swelling , long time ago.     Current Outpatient Prescriptions  Medication Sig Dispense Refill  . albuterol (PROVENTIL HFA;VENTOLIN HFA) 108 (90 BASE) MCG/ACT inhaler Inhale 2 puffs into the lungs every 4 (four) hours as needed. For shortness of breath       . allopurinol (ZYLOPRIM) 300 MG tablet Take 300 mg by mouth daily.      Marland Kitchen aspirin 81 MG tablet Take 1 tablet (81 mg total) by mouth daily.  30 tablet  11  . b complex vitamins tablet Take 1 tablet by mouth daily.      . carvedilol (COREG) 3.125 MG tablet Take 2 tablets (6.25 mg total) by mouth 2 (two) times daily with a meal.  120 tablet  3  . colchicine 0.6 MG tablet Take 0.6-1.2 mg by mouth daily. Take 2 tablets at the onset of gout symptoms, then take 1 tablet in an hour for a max of 3 tablets per 24 hours      . furosemide (LASIX) 40 MG tablet Take 1 tablet (40 mg total) by mouth 2 (two) times daily.  60 tablet  6  . gabapentin (NEURONTIN) 300 MG capsule Take 300 mg by mouth 2 (two) times daily.      Marland Kitchen ipratropium-albuterol (DUONEB) 0.5-2.5 (3) MG/3ML SOLN 3 mLs.      Marland Kitchen lisinopril (PRINIVIL,ZESTRIL) 2.5 MG tablet Take 1 tablet (2.5 mg total) by mouth at bedtime.  30 tablet  3  . lubiprostone (AMITIZA) 24 MCG capsule Take 24 mcg by mouth 2 (two) times daily with a meal.      . nitroGLYCERIN (NITROSTAT) 0.4 MG SL tablet Place 0.4 mg under the tongue.      Marland Kitchen Olopatadine HCl (PATADAY) 0.2 % SOLN Apply 1 drop to eye.      Marland Kitchen omeprazole (PRILOSEC) 20 MG capsule Take 20 mg by mouth daily.      . ondansetron (ZOFRAN) 4 MG tablet Take 4 mg by mouth every 8 (eight) hours as needed for nausea.       .  potassium chloride SA (K-DUR,KLOR-CON) 20 MEQ tablet Take 1 tablet (20 mEq total) by mouth daily.  30 tablet  6  . simvastatin (ZOCOR) 20 MG tablet Take 1 tablet (20 mg total) by mouth daily at 6 PM.  30 tablet  3  . spironolactone (ALDACTONE) 25 MG tablet Take 12.5 mg by mouth daily.      Marland Kitchen tiotropium (SPIRIVA) 18 MCG inhalation capsule Place 18  mcg into inhaler and inhale daily.       No current facility-administered medications for this visit.    ROS: See HPI for pertinent positives and negatives.   Physical Examination   Filed Vitals:   11/07/13 1007  BP: 102/70  Pulse: 74  Resp: 16  Height: 5' 7.5" (1.715 m)  Weight: 216 lb (97.977 kg)  SpO2: 98%   Body mass index is 33.31 kg/(m^2).  General: A&O x 3, WDWN, obese male. Gait: using cane Eyes: Pupils =. Pulmonary: CTAB, without wheezes , rales or rhonchi, decreased air movement in left posterior fields Cardiac: regular Rythm , without detected murmur.         Carotid Bruits Left Right   Negative Negative  Aorta is not palpable. Radial pulses: are 2+ palpable and =                           VASCULAR EXAM: Extremities without ischemic changes  without Gangrene; without open wounds. Feet are slightly ruddy, skin is dry, evidence of healed ulcer at dorsum of right foot, onychomycosis all toenails                                                                                                           LE Pulses LEFT RIGHT       FEMORAL  not palpable  not palpable        POPLITEAL  not palpable   not palpable       POSTERIOR TIBIAL  not palpable   not palpable        DORSALIS PEDIS      ANTERIOR TIBIAL not palpable  not palpable        PERONEAL not Palpable   not Palpable    Abdomen: soft, NT, no masses. Skin: no rashes, no ulcers noted. Musculoskeletal: no muscle wasting or atrophy.  Neurologic: A&O X 3; Appropriate Affect ; SENSATION: normal; MOTOR FUNCTION:  moving all extremities equally, motor strength  5/5 in UE's, 4/5 in LE's. Speech is fluent/normal. CN 2-12 intact.    Non-Invasive Vascular Imaging: DATE: 11/07/2013 ABI: RIGHT 0.63, Waveforms: monophasic;  LEFT 0.78, Waveforms: monophasic  ASSESSMENT: Geoge Lawrance is a 63 y.o. 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 He has pain at the posterior aspects of both knees after walking on a treadmill for 5 minutes, relieved by rest; he has an occupational history of laying carpet and attributes weak knees to this, he denies pain in thigh or calf muscles. ABI's  demonstrate moderate bilateral arterial occlusive disease in both legs, bilateral LE arterial Duplex result from Dec., 2014 showed diffuse plaque throughout lower extremities, but the pain at his posterior knees with walking is not typical of claudication pain and may be more related to OA pain of both knees.  The ulcers in his feet have healed.  PLAN:  Counseled re smoking cessation. Graduated walking program. I discussed in depth with  the patient the nature of atherosclerosis, and emphasized the importance of maximal medical management including strict control of blood pressure, blood glucose, and lipid levels, obtaining regular exercise, and cessation of smoking.  The patient is aware that without maximal medical management the underlying atherosclerotic disease process will progress, limiting the benefit of any interventions.  Based on the patient's vascular studies and examination, pt will return to clinic in 3 months for ABI's.  The patient was given information about PAD including signs, symptoms, treatment, what symptoms should prompt the patient to seek immediate medical care, and risk reduction measures to take.  Clemon Chambers, RN, MSN, FNP-C [facility] Phone: [phone]  Clinic MD: Early  11/07/2013 10:14 AM

## 2013-11-07 NOTE — Patient Instructions (Addendum)
Peripheral Vascular Disease Peripheral Vascular Disease (PVD), also called Peripheral Arterial Disease (PAD), is a circulation problem caused by cholesterol (atherosclerotic plaque) deposits in the arteries. PVD commonly occurs in the lower extremities (legs) but it can occur in other areas of the body, such as your arms. The cholesterol buildup in the arteries reduces blood flow which can cause pain and other serious problems. The presence of PVD can place a person at risk for Coronary Artery Disease (CAD).  CAUSES  Causes of PVD can be many. It is usually associated with more than one risk factor such as:   High Cholesterol.  Smoking.  Diabetes.  Lack of exercise or inactivity.  High blood pressure (hypertension).  Obesity.  Family history. SYMPTOMS   When the lower extremities are affected, patients with PVD may experience:  Leg pain with exertion or physical activity. This is called INTERMITTENT CLAUDICATION. This may present as cramping or numbness with physical activity. The location of the pain is associated with the level of blockage. For example, blockage at the abdominal level (distal abdominal aorta) may result in buttock or hip pain. Lower leg arterial blockage may result in calf pain.  As PVD becomes more severe, pain can develop with less physical activity.  In people with severe PVD, leg pain may occur at rest.  Other PVD signs and symptoms:  Leg numbness or weakness.  Coldness in the affected leg or foot, especially when compared to the other leg.  A change in leg color.  Patients with significant PVD are more prone to ulcers or sores on toes, feet or legs. These may take longer to heal or may reoccur. The ulcers or sores can become infected.  If signs and symptoms of PVD are ignored, gangrene may occur. This can result in the loss of toes or loss of an entire limb.  Not all leg pain is related to PVD. Other medical conditions can cause leg pain such  as:  Blood clots (embolism) or Deep Vein Thrombosis.  Inflammation of the blood vessels (vasculitis).  Spinal stenosis. DIAGNOSIS  Diagnosis of PVD can involve several different types of tests. These can include:  Pulse Volume Recording Method (PVR). This test is simple, painless and does not involve the use of X-rays. PVR involves measuring and comparing the blood pressure in the arms and legs. An ABI (Ankle-Brachial Index) is calculated. The normal ratio of blood pressures is 1. As this number becomes smaller, it indicates more severe disease.  < 0.95  indicates significant narrowing in one or more leg vessels.  <0.8 there will usually be pain in the foot, leg or buttock with exercise.  <0.4 will usually have pain in the legs at rest.  <0.25  usually indicates limb threatening PVD.  Doppler detection of pulses in the legs. This test is painless and checks to see if you have a pulses in your legs/feet.  A dye or contrast material (a substance that highlights the blood vessels so they show up on x-ray) may be given to help your caregiver better see the arteries for the following tests. The dye is eliminated from your body by the kidney's. Your caregiver may order blood work to check your kidney function and other laboratory values before the following tests are performed:  Magnetic Resonance Angiography (MRA). An MRA is a picture study of the blood vessels and arteries. The MRA machine uses a large magnet to produce images of the blood vessels.  Computed Tomography Angiography (CTA). A CTA is a   specialized x-ray that looks at how the blood flows in your blood vessels. An IV may be inserted into your arm so contrast dye can be injected.  Angiogram. Is a procedure that uses x-rays to look at your blood vessels. This procedure is minimally invasive, meaning a small incision (cut) is made in your groin. A small tube (catheter) is then inserted into the artery of your groin. The catheter is  guided to the blood vessel or artery your caregiver wants to examine. Contrast dye is injected into the catheter. X-rays are then taken of the blood vessel or artery. After the images are obtained, the catheter is taken out. TREATMENT  Treatment of PVD involves many interventions which may include:  Lifestyle changes:  Quitting smoking.  Exercise.  Following a low fat, low cholesterol diet.  Control of diabetes.  Foot care is very important to the PVD patient. Good foot care can help prevent infection.  Medication:  Cholesterol-lowering medicine.  Blood pressure medicine.  Anti-platelet drugs.  Certain medicines may reduce symptoms of Intermittent Claudication.  Interventional/Surgical options:  Angioplasty. An Angioplasty is a procedure that inflates a balloon in the blocked artery. This opens the blocked artery to improve blood flow.  Stent Implant. A wire mesh tube (stent) is placed in the artery. The stent expands and stays in place, allowing the artery to remain open.  Peripheral Bypass Surgery. This is a surgical procedure that reroutes the blood around a blocked artery to help improve blood flow. This type of procedure may be performed if Angioplasty or stent implants are not an option. SEEK IMMEDIATE MEDICAL CARE IF:   You develop pain or numbness in your arms or legs.  Your arm or leg turns cold, becomes blue in color.  You develop redness, warmth, swelling and pain in your arms or legs. MAKE SURE YOU:   Understand these instructions.  Will watch your condition.  Will get help right away if you are not doing well or get worse. Document Released: 07/16/2004 Document Revised: 08/31/2011 Document Reviewed: 06/12/2008 [employer] Patient Information 2014 [employer].   Smoking Cessation Quitting smoking is important to your health and has many advantages. However, it is not always easy to quit since nicotine is a very addictive drug. Often times, people try 3  times or more before being able to quit. This document explains the best ways for you to prepare to quit smoking. Quitting takes hard work and a lot of effort, but you can do it. ADVANTAGES OF QUITTING SMOKING  You will live longer, feel better, and live better.  Your body will feel the impact of quitting smoking almost immediately.  Within 20 minutes, blood pressure decreases. Your pulse returns to its normal level.  After 8 hours, carbon monoxide levels in the blood return to normal. Your oxygen level increases.  After 24 hours, the chance of having a heart attack starts to decrease. Your breath, hair, and body stop smelling like smoke.  After 48 hours, damaged nerve endings begin to recover. Your sense of taste and smell improve.  After 72 hours, the body is virtually free of nicotine. Your bronchial tubes relax and breathing becomes easier.  After 2 to 12 weeks, lungs can hold more air. Exercise becomes easier and circulation improves.  The risk of having a heart attack, stroke, cancer, or lung disease is greatly reduced.  After 1 year, the risk of coronary heart disease is cut in half.  After 5 years, the risk of stroke falls to   the same as a nonsmoker.  After 10 years, the risk of lung cancer is cut in half and the risk of other cancers decreases significantly.  After 15 years, the risk of coronary heart disease drops, usually to the level of a nonsmoker.  If you are pregnant, quitting smoking will improve your chances of having a healthy baby.  The people you live with, especially any children, will be healthier.  You will have extra money to spend on things other than cigarettes. QUESTIONS TO THINK ABOUT BEFORE ATTEMPTING TO QUIT You may want to talk about your answers with your caregiver.  Why do you want to quit?  If you tried to quit in the past, what helped and what did not?  What will be the most difficult situations for you after you quit? How will you plan to  handle them?  Who can help you through the tough times? Your family? Friends? A caregiver?  What pleasures do you get from smoking? What ways can you still get pleasure if you quit? Here are some questions to ask your caregiver:  How can you help me to be successful at quitting?  What medicine do you think would be best for me and how should I take it?  What should I do if I need more help?  What is smoking withdrawal like? How can I get information on withdrawal? GET READY  Set a quit date.  Change your environment by getting rid of all cigarettes, ashtrays, matches, and lighters in your home, car, or work. Do not let people smoke in your home.  Review your past attempts to quit. Think about what worked and what did not. GET SUPPORT AND ENCOURAGEMENT You have a better chance of being successful if you have help. You can get support in many ways.  Tell your family, friends, and co-workers that you are going to quit and need their support. Ask them not to smoke around you.  Get individual, group, or telephone counseling and support. Programs are available at local hospitals and health centers. Call your local health department for information about programs in your area.  Spiritual beliefs and practices may help some smokers quit.  Download a "quit meter" on your computer to keep track of quit statistics, such as how long you have gone without smoking, cigarettes not smoked, and money saved.  Get a self-help book about quitting smoking and staying off of tobacco. LEARN NEW SKILLS AND BEHAVIORS  Distract yourself from urges to smoke. Talk to someone, go for a walk, or occupy your time with a task.  Change your normal routine. Take a different route to work. Drink tea instead of coffee. Eat breakfast in a different place.  Reduce your stress. Take a hot bath, exercise, or read a book.  Plan something enjoyable to do every day. Reward yourself for not smoking.  Explore  interactive web-based programs that specialize in helping you quit. GET MEDICINE AND USE IT CORRECTLY Medicines can help you stop smoking and decrease the urge to smoke. Combining medicine with the above behavioral methods and support can greatly increase your chances of successfully quitting smoking.  Nicotine replacement therapy helps deliver nicotine to your body without the negative effects and risks of smoking. Nicotine replacement therapy includes nicotine gum, lozenges, inhalers, nasal sprays, and skin patches. Some may be available over-the-counter and others require a prescription.  Antidepressant medicine helps people abstain from smoking, but how this works is unknown. This medicine is available by prescription.    Nicotinic receptor partial agonist medicine simulates the effect of nicotine in your brain. This medicine is available by prescription. Ask your caregiver for advice about which medicines to use and how to use them based on your health history. Your caregiver will tell you what side effects to look out for if you choose to be on a medicine or therapy. Carefully read the information on the package. Do not use any other product containing nicotine while using a nicotine replacement product.  RELAPSE OR DIFFICULT SITUATIONS Most relapses occur within the first 3 months after quitting. Do not be discouraged if you start smoking again. Remember, most people try several times before finally quitting. You may have symptoms of withdrawal because your body is used to nicotine. You may crave cigarettes, be irritable, feel very hungry, cough often, get headaches, or have difficulty concentrating. The withdrawal symptoms are only temporary. They are strongest when you first quit, but they will go away within 10 14 days. To reduce the chances of relapse, try to:  Avoid drinking alcohol. Drinking lowers your chances of successfully quitting.  Reduce the amount of caffeine you consume. Once you  quit smoking, the amount of caffeine in your body increases and can give you symptoms, such as a rapid heartbeat, sweating, and anxiety.  Avoid smokers because they can make you want to smoke.  Do not let weight gain distract you. Many smokers will gain weight when they quit, usually less than 10 pounds. Eat a healthy diet and stay active. You can always lose the weight gained after you quit.  Find ways to improve your mood other than smoking. FOR MORE INFORMATION  www.smokefree.gov  Document Released: 06/02/2001 Document Revised: 12/08/2011 Document Reviewed: 09/17/2011 [facility] Patient Information 2014 [place], [place].  Secondhand Smoke Secondhand smoke is the smoke exhaled by smokers and the smoke given off by a burning cigarette, cigar, or pipe. When a cigarette is smoked, about half of the smoke is inhaled and exhaled by the smoker, and the other half floats around in the air. Exposure to secondhand smoke is also called involuntary smoking or passive smoking. People can be exposed to secondhand smoke in:   Homes.  Cars.  Workplaces.  Public places (bars, restaurants, other recreation sites). Exposure to secondhand smoke is hazardous.It contains more than 250 harmful chemicals, including at least 60 that can cause cancer. These chemicals include:  Arsenic, a heavy metal toxin.  Benzene, a chemical found in gasoline.  Beryllium, a toxic metal.  Cadmium, a metal used in batteries.  Chromium, a metallic element.  Ethylene oxide, a chemical used to sterilize medical devices.  Marise Knapper, a metallic element.  Polonium 210, a chemical element that gives off radiation.  Vinyl chloride, a toxic substance used in the Social research officer, government. Nonsmoking spouses and family members of smokers have higher rates of cancer, heart disease, and serious respiratory illnesses than those not exposed to secondhand smoke.  Nicotine, a nicotine by-product called cotinine, carbon monoxide, and  other evidence of secondhand smoke exposure have been found in the body fluids of nonsmokers exposed to secondhand smoke.  Living with a smoker may increase a nonsmoker's chances of developing lung cancer by 20 to 30 percent.  Secondhand smoke may increase the risk of breast cancer, nasal sinus cavity cancer, cervical cancer, bladder cancer, and nose and throat (nasopharyngeal) cancer in adults.  Secondhand smoke may increase the risk of heart disease by 25 to 30 percent. Children are especially at risk from secondhand smoke exposure. Children of  smokers have higher rates of:  Pneumonia.  Asthma.  Smoking.  Bronchitis.  Colds.  Chronic cough.  Ear infections.  Tonsilitis.  School absences. Research suggests that exposure to secondhand smoke may cause leukemia, lymphoma, and brain tumors in children. Babies are three times more likely to die from sudden infant death syndrome (SIDS) if their mothers smoked during and after pregnancy. There is no safe level of exposure to secondhand smoke. Studies have shown that even low levels of exposure can be harmful. The only way to fully protect nonsmokers from secondhand smoke exposure is to completely eliminate smoking in indoor spaces. The best thing you can do for your own health and for your children's health is to stop smoking. You should stop as soon as possible. This is not easy, and you may fail several times at quitting before you get free of this addiction. Nicotine replacement therapy ( such as patches, gum, or lozenges) can help. These therapies can help you deal with the physical symptoms of withdrawal. Attending quit-smoking support groups can help you deal with the emotional issues of quitting smoking.  Even if you are not ready to quit right now, there are some simple changes you can make to reduce the effect of your smoking on your family:  Do not smoke in your home. Smoke away from your home in an open area, preferably  outside.  Ask others to not smoke in your home.  Do not smoke while holding a child or when children are near.  Do not smoke in your car.  Avoid restaurants, day care centers, and other places that allow smoking. Document Released: 07/16/2004 Document Revised: 03/02/2012 Document Reviewed: 03/20/2009 [facility] Patient Information 2014 [place], [place].

## 2013-11-08 ENCOUNTER — Telehealth: Payer: Self-pay | Admitting: *Deleted

## 2013-11-08 NOTE — Telephone Encounter (Signed)
Attempted to call pt -- no answer.   (Need to inform pt CXR from 5/14 shows moderate-size left pleural effusion and Dr. Caryl Comes is referring him to pulmonology to address.  After this has been evaluated by pulm., then we will look at scheduling CRT-D implantation.)

## 2013-11-09 ENCOUNTER — Other Ambulatory Visit: Payer: Self-pay | Admitting: *Deleted

## 2013-11-09 DIAGNOSIS — J90 Pleural effusion, not elsewhere classified: Secondary | ICD-10-CM

## 2013-11-09 NOTE — Telephone Encounter (Signed)
Explained to pt need for pulm referral secondary to pleural effusion. Also explained that we will wait to schedule CRT-D procedure until after pulmonary visit/diagnosis. Pt is agreeable to this.

## 2013-11-09 NOTE — Telephone Encounter (Signed)
F/u ° ° °Pt returning your call °

## 2013-11-15 ENCOUNTER — Institutional Professional Consult (permissible substitution): Payer: Self-pay | Admitting: Internal Medicine

## 2013-11-23 ENCOUNTER — Institutional Professional Consult (permissible substitution): Payer: Self-pay | Admitting: Emergency Medicine

## 2013-11-28 ENCOUNTER — Ambulatory Visit (INDEPENDENT_AMBULATORY_CARE_PROVIDER_SITE_OTHER): Payer: Medicaid Other | Admitting: Emergency Medicine

## 2013-11-28 ENCOUNTER — Encounter: Payer: Self-pay | Admitting: Emergency Medicine

## 2013-11-28 VITALS — BP 140/92 | HR 78 | Ht 67.5 in | Wt 209.0 lb

## 2013-11-28 DIAGNOSIS — F17200 Nicotine dependence, unspecified, uncomplicated: Secondary | ICD-10-CM

## 2013-11-28 DIAGNOSIS — J449 Chronic obstructive pulmonary disease, unspecified: Secondary | ICD-10-CM

## 2013-11-28 DIAGNOSIS — J90 Pleural effusion, not elsewhere classified: Secondary | ICD-10-CM

## 2013-11-28 NOTE — Assessment & Plan Note (Signed)
Continue spiriva for now. Explained that he can use the albuterol prn, not just once a day on a schedule as he was doing. Cigarette cessation most important - discussed with him today.

## 2013-11-28 NOTE — Assessment & Plan Note (Signed)
Discussed cessation with him today. Will revisit and try to plan next time

## 2013-11-28 NOTE — Progress Notes (Signed)
Subjective:    Patient ID: [name] [name], male    DOB: [birth year], 63 y.o.   MRN: [id number]  HPI 63 yo man, smoker (30 pk-yrs), hx of HTN, DM, CAD/CABG with ischemic CM, PVD. Referred by Dr Caryl Comes for COPD and a L effusion on CXR 11/02/13 and also on a CT abdomen from [facility] 11/13/13. There is some associated atx vs opacity with some possible pleural thickening suggesting chronicity. CXR from 05/30/13 did not show any effusion. He denies any chest pain, L flank pain.  He was evaluated by Dr Caryl Comes to discuss the benefits of ICD vs  BiV pacing.   He continues to smoke, has congestion and a productive cough. Continues to have exertional SOB but this is baseline.    Review of Systems  Constitutional: Negative for fever and unexpected weight change.  HENT: Positive for congestion, postnasal drip and sinus pressure. Negative for dental problem, ear pain, nosebleeds, rhinorrhea, sneezing, sore throat and trouble swallowing.   Eyes: Negative for redness and itching.  Respiratory: Positive for cough, chest tightness, shortness of breath and wheezing.   Cardiovascular: Positive for leg swelling. Negative for palpitations.       Feet and ankles   Gastrointestinal: Negative for nausea and vomiting.  Genitourinary: Negative for dysuria.  Musculoskeletal: Positive for joint swelling.       Pain in feet  Skin: Negative for rash.  Neurological: Negative for headaches.  Hematological: Does not bruise/bleed easily.  Psychiatric/Behavioral: Negative for dysphoric mood. The patient is not nervous/anxious.    Past Medical History  Diagnosis Date  . COPD (chronic obstructive pulmonary disease)   . Gout   . Emphysema   . Hypertension   . Hyperlipidemia   . Tobacco abuse     30+ pack-year history  . Diabetes mellitus without complication   . Chronic systolic CHF (congestive heart failure)     a. 04/2013 Echo: EF 15-20%, diff HK, sev HK of inf and ant myocardium, dilated LA, improved EF post  cardiopulmonary bypass.  Marland Kitchen CAD (coronary artery disease)     a. 04/2013 Cath: LM 10, LAD 40p, 33m, D1 50p, LCX 60p, 47m/100m, OM1 50, OM2 100 L-L collats, RCA 100p;  b. CABG x 5: LIMA->LAD, VG->D2, VG->RI->OM2, VG->PDA.  . Transaminitis     a. 04/2013 Abd U/S and CT unremarkable, hepatitis panel neg -->felt to be 2/2 acute R heart failure.     Family History  Problem Relation Age of Onset  . Diabetes Mother   . Hypertension    . Heart attack      Brothers     History   Social History  . Marital Status: Single    Spouse Name: N/A    Number of Children: N/A  . Years of Education: N/A   Occupational History  . Retired    Social History Main Topics  . Smoking status: Current Every Day Smoker -- 1.00 packs/day for 30 years    Types: Cigarettes  . Smokeless tobacco: Never Used     Comment: has had a few cigarettes since discharge after CABG.  Marland Kitchen Alcohol Use: No  . Drug Use: No  . Sexual Activity: Not on file   Other Topics Concern  . Not on file   Social History Narrative   Lives in [place] with his girlfriend and her daughter.       Allergies  Allergen Reactions  . Penicillins Other (See Comments)    Swelling , long time ago.  Outpatient Prescriptions Prior to Visit  Medication Sig Dispense Refill  . albuterol (PROVENTIL HFA;VENTOLIN HFA) 108 (90 BASE) MCG/ACT inhaler Inhale 2 puffs into the lungs every 4 (four) hours as needed. For shortness of breath       . allopurinol (ZYLOPRIM) 300 MG tablet Take 300 mg by mouth daily.      Marland Kitchen aspirin 81 MG tablet Take 1 tablet (81 mg total) by mouth daily.  30 tablet  11  . b complex vitamins tablet Take 1 tablet by mouth daily.      . carvedilol (COREG) 3.125 MG tablet Take 2 tablets (6.25 mg total) by mouth 2 (two) times daily with a meal.  120 tablet  3  . colchicine 0.6 MG tablet Take 0.6-1.2 mg by mouth daily. Take 2 tablets at the onset of gout symptoms, then take 1 tablet in an hour for a max of 3 tablets per 24 hours       . furosemide (LASIX) 40 MG tablet Take 1 tablet (40 mg total) by mouth 2 (two) times daily.  60 tablet  6  . gabapentin (NEURONTIN) 300 MG capsule Take 300 mg by mouth 2 (two) times daily.      Marland Kitchen ipratropium-albuterol (DUONEB) 0.5-2.5 (3) MG/3ML SOLN 3 mLs.      Marland Kitchen lisinopril (PRINIVIL,ZESTRIL) 2.5 MG tablet Take 1 tablet (2.5 mg total) by mouth at bedtime.  30 tablet  3  . lubiprostone (AMITIZA) 24 MCG capsule Take 24 mcg by mouth 2 (two) times daily with a meal.      . nitroGLYCERIN (NITROSTAT) 0.4 MG SL tablet Place 0.4 mg under the tongue.      Marland Kitchen Olopatadine HCl (PATADAY) 0.2 % SOLN Apply 1 drop to eye.      Marland Kitchen omeprazole (PRILOSEC) 20 MG capsule Take 20 mg by mouth daily.      . ondansetron (ZOFRAN) 4 MG tablet Take 4 mg by mouth every 8 (eight) hours as needed for nausea.       . potassium chloride SA (K-DUR,KLOR-CON) 20 MEQ tablet Take 1 tablet (20 mEq total) by mouth daily.  30 tablet  6  . simvastatin (ZOCOR) 20 MG tablet Take 1 tablet (20 mg total) by mouth daily at 6 PM.  30 tablet  3  . spironolactone (ALDACTONE) 25 MG tablet Take 12.5 mg by mouth daily.      Marland Kitchen tiotropium (SPIRIVA) 18 MCG inhalation capsule Place 18 mcg into inhaler and inhale daily.       No facility-administered medications prior to visit.         Objective:   Physical Exam Filed Vitals:   11/28/13 1137  BP: 140/92  Pulse: 78  Height: 5' 7.5" (1.715 m)  Weight: 209 lb (94.802 kg)  SpO2: 98%   Gen: Pleasant, well-nourished, in no distress,  normal affect  ENT: No lesions,  mouth clear,  oropharynx clear, no postnasal drip  Neck: No JVD, no TMG, no carotid bruits  Lungs: No use of accessory muscles, decrease BS at the L base, clear without rales or rhonchi  Cardiovascular: RRR, heart sounds normal, no murmur or gallops, no peripheral edema  Musculoskeletal: No deformities, no cyanosis or clubbing  Neuro: alert, non focal  Skin: Warm, no lesions or rashes       Assessment & Plan:    Smoker Discussed cessation with him today. Will revisit and try to plan next time  Pleural effusion on left Etiology unclear. Most likely cause would be his cardiac  status with some total body volume accumulation, but consider the LLL opacity as a cause > with smoking hx he is at high risk for lung CA. The pleura;l thickening is worrisome for an inflammatory or malignant process. Best initial step is to obtain some fluid for chemistries and other studies. Once he is drained dry he may also benefit from a CT scan chest to better eval the LLL opacity./ will set up thora today  COPD (chronic obstructive pulmonary disease) Continue spiriva for now. Explained that he can use the albuterol prn, not just once a day on a schedule as he was doing. Cigarette cessation most important - discussed with him today.

## 2013-11-28 NOTE — Assessment & Plan Note (Addendum)
Etiology unclear. Most likely cause would be his cardiac status with some total body volume accumulation, but consider the LLL opacity as a cause > with smoking hx he is at high risk for lung CA. The pleura;l thickening is worrisome for an inflammatory or malignant process. Best initial step is to obtain some fluid for chemistries and other studies. Once he is drained dry he may also benefit from a CT scan chest to better eval the LLL opacity./ will set up thora today

## 2013-11-28 NOTE — Patient Instructions (Signed)
We will plan a procedure to drain the fluid around your left lung Please try to decrease your smoking. We will start talking about quitting next visit Continue your spiriva daily You may use your albuterol up to every 4 hours if needed for shortness of breath Follow with Dr Lamonte Sakai next available to review your results.

## 2013-11-30 ENCOUNTER — Telehealth: Payer: Self-pay | Admitting: Emergency Medicine

## 2013-11-30 NOTE — Telephone Encounter (Signed)
Left message with Jackelyn Poling and advised I did not receive transportation forms and could she re-fax them [phone] and to call me back at [phone] and to let me know she received my message.

## 2013-11-30 NOTE — Telephone Encounter (Signed)
Have received transportation forms and filled them out and placed for RB to sign and will fax back today (11/30/13) Nothing further needed

## 2013-11-30 NOTE — Telephone Encounter (Signed)
lmomtcb x1 

## 2013-11-30 NOTE — Telephone Encounter (Signed)
meghan do you have this form?   Please advise thanks

## 2013-12-01 NOTE — Telephone Encounter (Signed)
lmomtcb x 2  

## 2013-12-04 ENCOUNTER — Other Ambulatory Visit: Payer: Self-pay | Admitting: Emergency Medicine

## 2013-12-04 ENCOUNTER — Ambulatory Visit (HOSPITAL_COMMUNITY)
Admission: RE | Admit: 2013-12-04 | Discharge: 2013-12-04 | Disposition: A | Discharge status: Home / Self Care | Payer: Medicaid Other | Source: Ambulatory Visit | Attending: Emergency Medicine | Admitting: Emergency Medicine

## 2013-12-04 ENCOUNTER — Ambulatory Visit (HOSPITAL_COMMUNITY): Payer: Medicaid Other

## 2013-12-04 ENCOUNTER — Telehealth: Payer: Self-pay | Admitting: Emergency Medicine

## 2013-12-04 DIAGNOSIS — Z5309 Procedure and treatment not carried out because of other contraindication: Secondary | ICD-10-CM | POA: Diagnosis not present

## 2013-12-04 DIAGNOSIS — J90 Pleural effusion, not elsewhere classified: Secondary | ICD-10-CM

## 2013-12-04 DIAGNOSIS — Z87891 Personal history of nicotine dependence: Secondary | ICD-10-CM | POA: Insufficient documentation

## 2013-12-04 NOTE — Telephone Encounter (Signed)
Rowe Robert PA-C called stating that thoracentesis was not able to be done today d/t insufficient fluid  Will forward to RB as FYI

## 2013-12-04 NOTE — Telephone Encounter (Signed)
Pt has biopsy today. LMTCBx3. Sequatchie Bing, CMA

## 2013-12-04 NOTE — Progress Notes (Signed)
Patient ID: [name] [name], male   DOB: February 17, 1951, 63 y.o.   MRN: [id number]  PCP: [name] [facility]) Cardiac Surgeon: Dr Roxan Hockey Pulmonologist: Dr. Lamonte Sakai EP: Dr. Caryl Comes   HPI: [name] is a 63 yo with history of COPD, HTN, 3V CAD s/p CABG x 5  (04/2013: LIMA to LAD, SVG to second diagonal, SVG to ramus intermediate and obtuse marginal 2, SVG to posterior descending), ICM, chronic systolic HF, COPD,DM2, PVD and tobacco abuse.   ECHO 06/26/13 EF 20-25% RV mild HK ECHO 10/10/13 EF 20-25% restrictive filling pattern. Moderate RV dysfunction. Mild MR.  Follow up for Heart Failure: Last visit increased coreg to 6.25 mg BID which he tolerated. He was also set up to have CRT-D however was placed on hold d/t large pleural effusion on CXR. He was referred to pulmonary who wanted to have thoracentesis, however not enough fluid and has not heard back from pulmonary for plans. Feels good. Denies SOB, orthopnea, PND or CP. Can go up about 6 stairs before he gets SOB. Can walk around the whole grocery store without stopping but has to lean on cart. Weight at home 204-206 lbs. Ambulates with cane. Finished CR [facility] Still smoking about 7-8 cigarettes a day. Taking medications as prescribed. Still having some leg fatigue and pain in calves. HHRN fills boxes.   ECHO 06/26/13 EF 20-25% RV mild HK ECHO 10/10/13 EF 20-25% restrictive filling pattern. Moderate RV dysfunction. Mild MR.  ABIs 11/07/13 - RABI .63 moderate arterial occlusive dz,  - LABI .78 moderate arterial occlusive dz - R toe BI <.69 abnl - L toe BI < .69 abnl  Labs 09/19/13 K 4.2 Creatinine 1.1          11/02/13 K 4.0, Creatinine 1.12  SH: Lives with his girlfriend Smokes 7-8 cigarette per day. Does not drink alcohol  FH: 2 brothers died from heart attacks, Mother DM, Dad HTN   ROS: All systems negative except as listed in HPI, PMH and Problem List.  Past Medical History  Diagnosis Date  . COPD (chronic obstructive pulmonary  disease)   . Gout   . Emphysema   . Hypertension   . Hyperlipidemia   . Tobacco abuse     30+ pack-year history  . Diabetes mellitus without complication   . Chronic systolic CHF (congestive heart failure)     a. 04/2013 Echo: EF 15-20%, diff HK, sev HK of inf and ant myocardium, dilated LA, improved EF post cardiopulmonary bypass.  Marland Kitchen CAD (coronary artery disease)     a. 04/2013 Cath: LM 10, LAD 40p, 35m, D1 50p, LCX 60p, 69m/100m, OM1 50, OM2 100 L-L collats, RCA 100p;  b. CABG x 5: LIMA->LAD, VG->D2, VG->RI->OM2, VG->PDA.  . Transaminitis     a. 04/2013 Abd U/S and CT unremarkable, hepatitis panel neg -->felt to be 2/2 acute R heart failure.    Current Outpatient Prescriptions  Medication Sig Dispense Refill  . albuterol (PROVENTIL HFA;VENTOLIN HFA) 108 (90 BASE) MCG/ACT inhaler Inhale 2 puffs into the lungs every 4 (four) hours as needed. For shortness of breath       . allopurinol (ZYLOPRIM) 300 MG tablet Take 300 mg by mouth daily.      Marland Kitchen aspirin 81 MG tablet Take 1 tablet (81 mg total) by mouth daily.  30 tablet  11  . b complex vitamins tablet Take 1 tablet by mouth daily.      . carvedilol (COREG) 3.125 MG tablet Take 2 tablets (6.25 mg  total) by mouth 2 (two) times daily with a meal.  120 tablet  3  . colchicine 0.6 MG tablet Take 0.6-1.2 mg by mouth daily. Take 2 tablets at the onset of gout symptoms, then take 1 tablet in an hour for a max of 3 tablets per 24 hours      . furosemide (LASIX) 40 MG tablet Take 1 tablet (40 mg total) by mouth 2 (two) times daily.  60 tablet  6  . gabapentin (NEURONTIN) 300 MG capsule Take 300 mg by mouth 2 (two) times daily.      Marland Kitchen lisinopril (PRINIVIL,ZESTRIL) 2.5 MG tablet Take 1 tablet (2.5 mg total) by mouth at bedtime.  30 tablet  3  . lubiprostone (AMITIZA) 24 MCG capsule Take 24 mcg by mouth 2 (two) times daily with a meal.      . nitroGLYCERIN (NITROSTAT) 0.4 MG SL tablet Place 0.4 mg under the tongue.      Marland Kitchen Olopatadine HCl (PATADAY) 0.2  % SOLN Apply 1 drop to eye.      Marland Kitchen omeprazole (PRILOSEC) 20 MG capsule Take 20 mg by mouth daily.      . ondansetron (ZOFRAN) 4 MG tablet Take 4 mg by mouth every 8 (eight) hours as needed for nausea.       Marland Kitchen oxycodone (OXY-IR) 5 MG capsule Take 5 mg by mouth every 4 (four) hours as needed.      . potassium chloride SA (K-DUR,KLOR-CON) 20 MEQ tablet Take 1 tablet (20 mEq total) by mouth daily.  30 tablet  6  . simvastatin (ZOCOR) 20 MG tablet Take 1 tablet (20 mg total) by mouth daily at 6 PM.  30 tablet  3  . spironolactone (ALDACTONE) 25 MG tablet Take 12.5 mg by mouth daily.      Marland Kitchen tiotropium (SPIRIVA) 18 MCG inhalation capsule Place 18 mcg into inhaler and inhale daily.      . traMADol (ULTRAM) 50 MG tablet Take 50 mg by mouth every 6 (six) hours as needed.       No current facility-administered medications for this encounter.    Filed Vitals:   12/05/13 0956  BP: 111/73  Pulse: 83  Resp: 16  Weight: 207 lb 8 oz (94.121 kg)  SpO2: 99%    PHYSICAL EXAM: General:  Elderly. No resp difficulty. Walks with cane. HEENT: normal Neck: supple. JVP 5-6. Carotids 2+ bilaterally; no bruits. No lymphadenopathy or thryomegaly appreciated. Cor: PMI normal. Heart regular S1/S2, no S3/S4, 1/6 HSM LLSB. Lungs: clear with mildly decreased breath sounds throughout Abdomen: soft, nontender, nondistended. No hepatosplenomegaly. No bruits or masses. Good bowel sounds. Extremities: no cyanosis, clubbing, rash, edema Neuro: alert & orientedx3, cranial nerves grossly intact. Moves all 4 extremities w/o difficulty. Affect pleasant.   ASSESSMENT & PLAN:  1. CAD s/p recent CABG- Stable no s/s of ischemia. Continue aspirin, statin and BB. He has completed cardiac rehab.  2. Chronic systolic HF: EF 19% 10/29/30 Evaluated by Dr Caryl Comes with plan for CRT-D soon. If he has no functional improvement will need CPX. Awaiting CRT-D until pulmonary issues resolved. He had large pleural effusion on CXR and was set up  for thoracentesis but not enough fluid to tap.  - NYHA II symptoms and volume status stable. Continue lasix 40 mg BID and spironolactone 12.5  mg daily. - Continue coreg 6.25 mg BID - Will increase lisinopril to 2.5 mg BID. Will check BMET in 7-10 days - Reinforced the need and importance of daily weights,  a low sodium diet, and fluid restriction (less than 2 L a day). Instructed to call the HF clinic if weight increases more than 3 lbs overnight or 5 lbs in a week.  3. Current Smoker-  Encouraged to stop smoking.  4. PVD - ABIs in 10/2013 with moderate bilateral arterial occlusive disease. Encouraged to quit smoking and continue medical treatment and walking program. He has follow up in 3 months.  F/U 4-6 weeks for medication titration Junie Bame B NP-C  10:22 AM

## 2013-12-04 NOTE — Progress Notes (Signed)
Patient ID: [name] [name], male   DOB: 10-Jan-1951, 63 y.o.   MRN: [id number] Pt presented to [place] dept today for US guided left thoracentesis. On limited US post left chest there is only a minimal amount of loculated left pleural fluid present which is not amenable to safely tap at this time. Images were reviewed by Dr. Laurence Ferrari.Procedure was cancelled. Pt aware.

## 2013-12-05 ENCOUNTER — Ambulatory Visit (HOSPITAL_COMMUNITY)
Admission: RE | Admit: 2013-12-05 | Discharge: 2013-12-05 | Disposition: A | Discharge status: Home / Self Care | Payer: Medicaid Other | Source: Ambulatory Visit | Attending: Internal Medicine | Admitting: Internal Medicine

## 2013-12-05 ENCOUNTER — Encounter (HOSPITAL_COMMUNITY): Payer: Self-pay

## 2013-12-05 VITALS — BP 111/73 | HR 83 | Resp 16 | Wt 207.5 lb

## 2013-12-05 DIAGNOSIS — Z09 Encounter for follow-up examination after completed treatment for conditions other than malignant neoplasm: Secondary | ICD-10-CM | POA: Insufficient documentation

## 2013-12-05 DIAGNOSIS — I749 Embolism and thrombosis of unspecified artery: Secondary | ICD-10-CM | POA: Diagnosis not present

## 2013-12-05 DIAGNOSIS — I10 Essential (primary) hypertension: Secondary | ICD-10-CM

## 2013-12-05 DIAGNOSIS — Z8249 Family history of ischemic heart disease and other diseases of the circulatory system: Secondary | ICD-10-CM | POA: Diagnosis not present

## 2013-12-05 DIAGNOSIS — M109 Gout, unspecified: Secondary | ICD-10-CM | POA: Diagnosis not present

## 2013-12-05 DIAGNOSIS — Z7982 Long term (current) use of aspirin: Secondary | ICD-10-CM | POA: Insufficient documentation

## 2013-12-05 DIAGNOSIS — Z951 Presence of aortocoronary bypass graft: Secondary | ICD-10-CM | POA: Diagnosis not present

## 2013-12-05 DIAGNOSIS — E119 Type 2 diabetes mellitus without complications: Secondary | ICD-10-CM | POA: Insufficient documentation

## 2013-12-05 DIAGNOSIS — F17200 Nicotine dependence, unspecified, uncomplicated: Secondary | ICD-10-CM

## 2013-12-05 DIAGNOSIS — I5022 Chronic systolic (congestive) heart failure: Secondary | ICD-10-CM

## 2013-12-05 DIAGNOSIS — E785 Hyperlipidemia, unspecified: Secondary | ICD-10-CM | POA: Diagnosis not present

## 2013-12-05 DIAGNOSIS — I2510 Atherosclerotic heart disease of native coronary artery without angina pectoris: Secondary | ICD-10-CM | POA: Diagnosis not present

## 2013-12-05 DIAGNOSIS — Z79899 Other long term (current) drug therapy: Secondary | ICD-10-CM | POA: Diagnosis not present

## 2013-12-05 DIAGNOSIS — J438 Other emphysema: Secondary | ICD-10-CM | POA: Diagnosis not present

## 2013-12-05 DIAGNOSIS — I70219 Atherosclerosis of native arteries of extremities with intermittent claudication, unspecified extremity: Secondary | ICD-10-CM

## 2013-12-05 HISTORY — DX: Ischemic cardiomyopathy: I25.5

## 2013-12-05 HISTORY — DX: Peripheral vascular disease, unspecified: I73.9

## 2013-12-05 MED ORDER — LISINOPRIL 2.5 MG PO TABS: 2.5000 mg | ORAL_TABLET | Freq: Two times a day (BID) | ORAL | Status: DC

## 2013-12-05 NOTE — Telephone Encounter (Signed)
Please let the pt know that I know about the thoracentesis - I think he needs to have a CT scan chest without contrast and then see me in office to review. Please set these up.

## 2013-12-05 NOTE — Telephone Encounter (Signed)
Spoke with pt's wife and advised her of RB's rec's. She understands order placed for CT and that someone will call them to schedule this and he will need to follow up with RB after. Nothing further needed at this point

## 2013-12-05 NOTE — Patient Instructions (Addendum)
Doing great.  Increase your lisinopril (Prinivil or Zesteril) to 2.5 mg (1 tablet) in the morning and and 2.5 mg (1 tablet) in the evening.  Come back next Tuesday for blood work.  Follow up in 5 weeks   Do the following things EVERYDAY: 1) Weigh yourself in the morning before breakfast. Write it down and keep it in a log. 2) Take your medicines as prescribed 3) Eat low salt foods-Limit salt (sodium) to 2000 mg per day.  4) Stay as active as you can everyday 5) Limit all fluids for the day to less than 2 liters 6)

## 2013-12-08 ENCOUNTER — Ambulatory Visit (INDEPENDENT_AMBULATORY_CARE_PROVIDER_SITE_OTHER)
Admission: RE | Admit: 2013-12-08 | Discharge: 2013-12-08 | Disposition: A | Discharge status: Home / Self Care | Payer: Medicaid Other | Source: Ambulatory Visit | Attending: Emergency Medicine | Admitting: Emergency Medicine

## 2013-12-08 ENCOUNTER — Ambulatory Visit (HOSPITAL_COMMUNITY)
Admission: RE | Admit: 2013-12-08 | Discharge: 2013-12-08 | Disposition: A | Discharge status: Home / Self Care | Payer: Medicaid Other | Source: Ambulatory Visit | Attending: Internal Medicine | Admitting: Internal Medicine

## 2013-12-08 DIAGNOSIS — J90 Pleural effusion, not elsewhere classified: Secondary | ICD-10-CM

## 2013-12-08 DIAGNOSIS — I5022 Chronic systolic (congestive) heart failure: Secondary | ICD-10-CM

## 2013-12-08 DIAGNOSIS — I5021 Acute systolic (congestive) heart failure: Secondary | ICD-10-CM | POA: Diagnosis not present

## 2013-12-08 DIAGNOSIS — I509 Heart failure, unspecified: Secondary | ICD-10-CM | POA: Diagnosis present

## 2013-12-08 LAB — BASIC METABOLIC PANEL
BUN: 13 mg/dL (ref 6–23)
CHLORIDE: 100 meq/L (ref 96–112)
CO2: 24 meq/L (ref 19–32)
CREATININE: 1.01 mg/dL (ref 0.50–1.35)
Calcium: 9.7 mg/dL (ref 8.4–10.5)
GFR calc Af Amer: >90 mL/min (ref >90)
GFR calc non Af Amer: 78 mL/min — ABNORMAL LOW (ref >90)
Glucose, Bld: 90 mg/dL (ref 70–99)
Potassium: 3.7 mEq/L (ref 3.7–5.3)
Sodium: 138 mEq/L (ref 137–147)

## 2013-12-12 ENCOUNTER — Other Ambulatory Visit (HOSPITAL_COMMUNITY): Payer: Self-pay

## 2013-12-25 ENCOUNTER — Other Ambulatory Visit: Payer: Self-pay | Admitting: Nurse Practitioner

## 2013-12-25 ENCOUNTER — Encounter: Payer: Self-pay | Admitting: Gastroenterology

## 2013-12-26 NOTE — Telephone Encounter (Signed)
Rx was sent to pharmacy electronically. 

## 2013-12-28 ENCOUNTER — Ambulatory Visit: Payer: Self-pay | Admitting: Emergency Medicine

## 2014-01-03 ENCOUNTER — Other Ambulatory Visit: Payer: Self-pay | Admitting: Nurse Practitioner

## 2014-01-03 ENCOUNTER — Other Ambulatory Visit (HOSPITAL_COMMUNITY): Payer: Self-pay | Admitting: Adult Health

## 2014-01-09 ENCOUNTER — Inpatient Hospital Stay (HOSPITAL_COMMUNITY): Admission: RE | Admit: 2014-01-09 | Payer: Self-pay | Source: Ambulatory Visit

## 2014-01-09 ENCOUNTER — Encounter (HOSPITAL_COMMUNITY): Payer: Self-pay

## 2014-01-22 NOTE — Progress Notes (Signed)
Patient ID: [name] [name], male   DOB: 09-12-1950, 63 y.o.   MRN: [id number]  PCP: [name] [facility]) Cardiac Surgeon: Dr Roxan Hockey Pulmonologist: Dr. Lamonte Sakai EP: Dr. Caryl Comes   HPI: [name] is a 63 yo with history of COPD, HTN, 3V CAD s/p CABG x 5  (04/2013: LIMA to LAD, SVG to second diagonal, SVG to ramus intermediate and obtuse marginal 2, SVG to posterior descending), ICM, chronic systolic HF, COPD,DM2, PVD and tobacco abuse.   ECHO 06/26/13 EF 20-25% RV mild HK ECHO 10/10/13 EF 20-25% restrictive filling pattern. Moderate RV dysfunction. Mild MR.  Follow up for Heart Failure: CRT-D was place don hold due to pleural efusion however he did not have enough to tap per Dr Lamonte Sakai. Last visit lisinopril was increased to 2.5 mg twice a day.  Overall he feels good. Able to walk 1/2 mile 3 times a week. Mild dyspnea with steps. + Bendopnea. Denies orthopnea/PND. Weight at home 207-209 pounds. Tries to follow low slat diet. Drinking > 2 liters per day +eating water melon daily. HH completed. Smoking 1/2 pack per day.   ECHO 06/26/13 EF 20-25% RV mild HK ECHO 10/10/13 EF 20-25% restrictive filling pattern. Moderate RV dysfunction. Mild MR.  ABIs 11/07/13 - RABI .63 moderate arterial occlusive dz,  - LABI .78 moderate arterial occlusive dz - R toe BI <.69 abnl - L toe BI < .69 abnl  Labs 09/19/13 K 4.2 Creatinine 1.1          11/02/13 K 4.0, Creatinine 1.12          12/08/13 K 3.7 Creatinine 1.01   SH: Lives with his girlfriend Smokes 7-8 cigarette per day. Does not drink alcohol  FH: 2 brothers died from heart attacks, Mother DM, Dad HTN   ROS: All systems negative except as listed in HPI, PMH and Problem List.  Past Medical History  Diagnosis Date  . COPD (chronic obstructive pulmonary disease)   . Gout   . Emphysema   . Hypertension   . Hyperlipidemia   . Tobacco abuse     30+ pack-year history  . Diabetes mellitus without complication   . Chronic systolic CHF (congestive heart failure)      a. 04/2013 Echo: EF 15-20%, diff HK, sev HK of inf and ant myocardium, dilated LA,  b. EF 20-25% with restrictive filling pattern, mod RV dysfx, mild MR (10/10/2013)  . CAD (coronary artery disease)     a. 04/2013 Cath: LM 10, LAD 40p, 65m, D1 50p, LCX 60p, 8m/100m, OM1 50, OM2 100 L-L collats, RCA 100p;  b. CABG x 5: LIMA->LAD, VG->D2, VG->RI->OM2, VG->PDA.  . Transaminitis     a. 04/2013 Abd U/S and CT unremarkable, hepatitis panel neg -->felt to be 2/2 acute R heart failure.  . Ischemic cardiomyopathy   . PVD (peripheral vascular disease)     a. (10/2013) ABIs: RIGHT 0.63, Waveforms: monophasic;  LEFT 0.78, Waveforms: monophasic    Current Outpatient Prescriptions  Medication Sig Dispense Refill  . albuterol (PROVENTIL HFA;VENTOLIN HFA) 108 (90 BASE) MCG/ACT inhaler Inhale 2 puffs into the lungs every 4 (four) hours as needed. For shortness of breath       . allopurinol (ZYLOPRIM) 300 MG tablet Take 300 mg by mouth daily.      Marland Kitchen aspirin 81 MG tablet Take 1 tablet (81 mg total) by mouth daily.  30 tablet  11  . b complex vitamins tablet Take 1 tablet by mouth daily.      Marland Kitchen  carvedilol (COREG) 3.125 MG tablet Take 2 tablets (6.25 mg total) by mouth 2 (two) times daily with a meal.  120 tablet  3  . colchicine 0.6 MG tablet Take 0.6-1.2 mg by mouth daily. Take 2 tablets at the onset of gout symptoms, then take 1 tablet in an hour for a max of 3 tablets per 24 hours      . furosemide (LASIX) 40 MG tablet TAKE 1 TABLET BY MOUTH TWICE A DAY  60 tablet  6  . gabapentin (NEURONTIN) 300 MG capsule Take 300 mg by mouth 2 (two) times daily.      Marland Kitchen KLOR-CON M20 20 MEQ tablet TAKE 1 TABLET (20 MEQ TOTAL) BY MOUTH DAILY.  30 tablet  5  . lisinopril (PRINIVIL,ZESTRIL) 2.5 MG tablet Take 1 tablet (2.5 mg total) by mouth 2 (two) times daily.  60 tablet  3  . lubiprostone (AMITIZA) 24 MCG capsule Take 24 mcg by mouth 2 (two) times daily with a meal.      . nitroGLYCERIN (NITROSTAT) 0.4 MG SL tablet Place  0.4 mg under the tongue.      Marland Kitchen Olopatadine HCl (PATADAY) 0.2 % SOLN Apply 1 drop to eye.      Marland Kitchen omeprazole (PRILOSEC) 20 MG capsule Take 20 mg by mouth daily.      . ondansetron (ZOFRAN) 4 MG tablet Take 4 mg by mouth every 8 (eight) hours as needed for nausea.       Marland Kitchen oxycodone (OXY-IR) 5 MG capsule Take 5 mg by mouth every 4 (four) hours as needed.      . simvastatin (ZOCOR) 20 MG tablet Take 1 tablet (20 mg total) by mouth daily at 6 PM.  30 tablet  3  . spironolactone (ALDACTONE) 25 MG tablet Take 12.5 mg by mouth daily.      Marland Kitchen tiotropium (SPIRIVA) 18 MCG inhalation capsule Place 18 mcg into inhaler and inhale daily.      . traMADol (ULTRAM) 50 MG tablet Take 50 mg by mouth every 6 (six) hours as needed.       No current facility-administered medications for this encounter.    Filed Vitals:   01/23/14 1108  BP: 115/69  Pulse: 87  Weight: 211 lb (95.709 kg)  SpO2: 99%    PHYSICAL EXAM: General:  Elderly. No resp difficulty. Walks with cane. HEENT: normal Neck: supple. JVP 7-8. Carotids 2+ bilaterally; no bruits. No lymphadenopathy or thryomegaly appreciated. Cor: PMI normal. Heart regular S1/S2, no S3/S4, 1/6 HSM LLSB. Lungs: clear with mildly decreased breath sounds throughout Abdomen: soft, nontender, nondistended. No hepatosplenomegaly. No bruits or masses. Good bowel sounds. Extremities: no cyanosis, clubbing, rash, edema Neuro: alert & orientedx3, cranial nerves grossly intact. Moves all 4 extremities w/o difficulty. Affect pleasant.   ASSESSMENT & PLAN:  1. CAD s/p recent CABG- Stable no s/s of ischemia. Continue aspirin, statin and BB.  2. Chronic systolic HF: EF 78% 6/76/72 Evaluated by Dr Caryl Comes with plan for CRT-D soon. If he has no functional improvement will need CPX. Awaiting CRT-D until pulmonary issues resolved. He had large pleural effusion on CXR and was set up for thoracentesis but not enough fluid to tap.  - NYHA II symptoms and volume status stable despite  weight gain. Continue lasix 40 mg BID and spironolactone 12.5  mg daily. - Increase coreg 9.375 mg mg BID - Continue lisinopril to 2.5 mg BID.  Check BMET and Pro BNP today. - Reinforced the need and importance of daily weights,  a low sodium diet, and fluid restriction (less than 2 L a day). Instructed to call the HF clinic if weight increases more than 3 lbs overnight or 5 lbs in a week.  3. Current Smoker-  Encouraged to stop smoking.  4. PVD - ABIs in 10/2013 with moderate bilateral arterial occlusive disease. Encouraged to quit smoking and continue medical treatment and walking program. He has follow up iwith Dr Scot Dock scheduled.   Follow up in 4 weeks with Dr Haroldine Laws. Anticipate referring him back for CRT-D by  Dr Caryl Comes.  Vickye Astorino NP-C  11:29 AM

## 2014-01-23 ENCOUNTER — Encounter (HOSPITAL_COMMUNITY): Payer: Self-pay

## 2014-01-23 ENCOUNTER — Ambulatory Visit (HOSPITAL_COMMUNITY)
Admission: RE | Admit: 2014-01-23 | Discharge: 2014-01-23 | Disposition: A | Discharge status: Home / Self Care | Payer: Medicaid Other | Source: Ambulatory Visit | Attending: Internal Medicine | Admitting: Internal Medicine

## 2014-01-23 VITALS — BP 115/69 | HR 87 | Wt 211.0 lb

## 2014-01-23 DIAGNOSIS — E119 Type 2 diabetes mellitus without complications: Secondary | ICD-10-CM | POA: Diagnosis not present

## 2014-01-23 DIAGNOSIS — Z7982 Long term (current) use of aspirin: Secondary | ICD-10-CM | POA: Diagnosis not present

## 2014-01-23 DIAGNOSIS — I5022 Chronic systolic (congestive) heart failure: Secondary | ICD-10-CM | POA: Diagnosis not present

## 2014-01-23 DIAGNOSIS — I10 Essential (primary) hypertension: Secondary | ICD-10-CM | POA: Insufficient documentation

## 2014-01-23 DIAGNOSIS — I739 Peripheral vascular disease, unspecified: Secondary | ICD-10-CM | POA: Diagnosis not present

## 2014-01-23 DIAGNOSIS — M109 Gout, unspecified: Secondary | ICD-10-CM | POA: Insufficient documentation

## 2014-01-23 DIAGNOSIS — J449 Chronic obstructive pulmonary disease, unspecified: Secondary | ICD-10-CM | POA: Insufficient documentation

## 2014-01-23 DIAGNOSIS — F17200 Nicotine dependence, unspecified, uncomplicated: Secondary | ICD-10-CM | POA: Insufficient documentation

## 2014-01-23 DIAGNOSIS — Z951 Presence of aortocoronary bypass graft: Secondary | ICD-10-CM | POA: Insufficient documentation

## 2014-01-23 DIAGNOSIS — I2589 Other forms of chronic ischemic heart disease: Secondary | ICD-10-CM | POA: Diagnosis not present

## 2014-01-23 DIAGNOSIS — I2510 Atherosclerotic heart disease of native coronary artery without angina pectoris: Secondary | ICD-10-CM | POA: Insufficient documentation

## 2014-01-23 DIAGNOSIS — J4489 Other specified chronic obstructive pulmonary disease: Secondary | ICD-10-CM | POA: Insufficient documentation

## 2014-01-23 DIAGNOSIS — E785 Hyperlipidemia, unspecified: Secondary | ICD-10-CM | POA: Insufficient documentation

## 2014-01-23 LAB — BASIC METABOLIC PANEL
Anion gap: 14 (ref 5–15)
BUN: 32 mg/dL — ABNORMAL HIGH (ref 6–23)
CALCIUM: 10.3 mg/dL (ref 8.4–10.5)
CO2: 21 meq/L (ref 19–32)
CREATININE: 1.18 mg/dL (ref 0.50–1.35)
Chloride: 101 mEq/L (ref 96–112)
GFR calc Af Amer: 75 mL/min — ABNORMAL LOW (ref >90)
GFR calc non Af Amer: 64 mL/min — ABNORMAL LOW (ref >90)
GLUCOSE: 92 mg/dL (ref 70–99)
Potassium: 4.1 mEq/L (ref 3.7–5.3)
Sodium: 136 mEq/L — ABNORMAL LOW (ref 137–147)

## 2014-01-23 LAB — PRO B NATRIURETIC PEPTIDE: Pro B Natriuretic peptide (BNP): 635.6 pg/mL — ABNORMAL HIGH (ref 0–125)

## 2014-01-23 MED ORDER — CARVEDILOL 3.125 MG PO TABS: ORAL_TABLET | ORAL | Status: DC

## 2014-01-23 NOTE — Patient Instructions (Signed)
Follow up in 1 month  Take carveidlol 6.25 mg in am and 9.375 mg in evening    Do the following things EVERYDAY: 1) Weigh yourself in the morning before breakfast. Write it down and keep it in a log. 2) Take your medicines as prescribed 3) Eat low salt foods-Limit salt (sodium) to 2000 mg per day.  4) Stay as active as you can everyday 5) Limit all fluids for the day to less than 2 liters

## 2014-02-02 ENCOUNTER — Telehealth: Payer: Self-pay | Admitting: *Deleted

## 2014-02-02 NOTE — Telephone Encounter (Signed)
Dr. Fuller Plan,  [name] [name] is scheduled for previsit Monday February 05, 2014 and a screening colonoscopy February 19, 2014. Upon reviewing his chart it is noted that he has an EF of 20-25% and therefore would not be eligible to have his procedure here at [facility]. Okay to schedule direct at hospital or would you prefer an OV first?  Thank you, Olivia Mackie

## 2014-02-02 NOTE — Telephone Encounter (Signed)
Phone call to Mr. Bruington to schedule office visit for evaluation of need for colonoscopy. EF 20-25% and significant medical history. [name] [name] understands that he will not be seeing the previsit nurse that day but will be seeing Janne Napoleon, NP for evaluation of his medical history and next best steps for GI evaluation.

## 2014-02-02 NOTE — Telephone Encounter (Signed)
Patient should have a office visit prior

## 2014-02-07 ENCOUNTER — Encounter (HOSPITAL_COMMUNITY): Payer: Self-pay

## 2014-02-07 ENCOUNTER — Ambulatory Visit: Payer: Self-pay | Admitting: Family

## 2014-02-08 ENCOUNTER — Encounter: Payer: Self-pay | Admitting: Nurse Practitioner

## 2014-02-08 ENCOUNTER — Ambulatory Visit (INDEPENDENT_AMBULATORY_CARE_PROVIDER_SITE_OTHER): Payer: Medicaid Other | Admitting: Nurse Practitioner

## 2014-02-08 VITALS — BP 112/60 | HR 68 | Ht 67.5 in | Wt 214.0 lb

## 2014-02-08 DIAGNOSIS — Z1211 Encounter for screening for malignant neoplasm of colon: Secondary | ICD-10-CM

## 2014-02-08 DIAGNOSIS — D649 Anemia, unspecified: Secondary | ICD-10-CM

## 2014-02-08 NOTE — Progress Notes (Signed)
HPI :   Patient is a 63 year old male, new to this practice, referred for evaluation of colon cancer screening. He has multiple, significant medical problems including, but not limited to, COPD, hypertension, ischemic heart disease / status post bypass surgery November 2014. Patient has significant left ventricular dysfunction and an ejection fraction of 20-25%. He is being considered for an ICD   Patient has no gastrointestinal complaints. Specifically, no abdominal pain, bowel changes or rectal bleeding. He's had no unexpected weight loss, and a family history of colon cancer.  Past Medical History  Diagnosis Date  . COPD (chronic obstructive pulmonary disease)   . Gout   . Emphysema   . Hypertension   . Hyperlipidemia   . Tobacco abuse     30+ pack-year history  . Diabetes mellitus without complication   . Chronic systolic CHF (congestive heart failure)     a. 04/2013 Echo: EF 15-20%, diff HK, sev HK of inf and ant myocardium, dilated LA,  b. EF 20-25% with restrictive filling pattern, mod RV dysfx, mild MR (10/10/2013)  . CAD (coronary artery disease)     a. 04/2013 Cath: LM 10, LAD 40p, 74m, D1 50p, LCX 60p, 31m/100m, OM1 50, OM2 100 L-L collats, RCA 100p;  b. CABG x 5: LIMA->LAD, VG->D2, VG->RI->OM2, VG->PDA.  . Transaminitis     a. 04/2013 Abd U/S and CT unremarkable, hepatitis panel neg -->felt to be 2/2 acute R heart failure.  . Ischemic cardiomyopathy   . PVD (peripheral vascular disease)     a. (10/2013) ABIs: RIGHT 0.63, Waveforms: monophasic;  LEFT 0.78, Waveforms: monophasic     Family History  Problem Relation Age of Onset  . Diabetes Mother   . Hypertension    . Heart attack      Brothers   History  Substance Use Topics  . Smoking status: Current Every Day Smoker -- 1.00 packs/day for 30 years    Types: Cigarettes  . Smokeless tobacco: Never Used     Comment: smokes 7-8 cigarettes a day  . Alcohol Use: No   Current Outpatient Prescriptions  Medication  Sig Dispense Refill  . albuterol (PROVENTIL HFA;VENTOLIN HFA) 108 (90 BASE) MCG/ACT inhaler Inhale 2 puffs into the lungs every 4 (four) hours as needed. For shortness of breath       . allopurinol (ZYLOPRIM) 300 MG tablet Take 300 mg by mouth daily.      Marland Kitchen aspirin 81 MG tablet Take 1 tablet (81 mg total) by mouth daily.  30 tablet  11  . b complex vitamins tablet Take 1 tablet by mouth daily.      . carvedilol (COREG) 3.125 MG tablet Take 6.25 mg (2 tabs)  in am and 9.375 mg in pm (3 tabs)  120 tablet  3  . colchicine 0.6 MG tablet Take 0.6-1.2 mg by mouth daily. Take 2 tablets at the onset of gout symptoms, then take 1 tablet in an hour for a max of 3 tablets per 24 hours      . furosemide (LASIX) 40 MG tablet TAKE 1 TABLET BY MOUTH TWICE A DAY  60 tablet  6  . gabapentin (NEURONTIN) 300 MG capsule Take 300 mg by mouth 2 (two) times daily.      Marland Kitchen KLOR-CON M20 20 MEQ tablet TAKE 1 TABLET (20 MEQ TOTAL) BY MOUTH DAILY.  30 tablet  5  . lisinopril (PRINIVIL,ZESTRIL) 2.5 MG tablet Take 1 tablet (2.5 mg total) by mouth 2 (two) times daily.  60 tablet  3  . lubiprostone (AMITIZA) 24 MCG capsule Take 24 mcg by mouth 2 (two) times daily with a meal.      . nitroGLYCERIN (NITROSTAT) 0.4 MG SL tablet Place 0.4 mg under the tongue.      Marland Kitchen Olopatadine HCl (PATADAY) 0.2 % SOLN Apply 1 drop to eye.      Marland Kitchen omeprazole (PRILOSEC) 20 MG capsule Take 20 mg by mouth daily.      . ondansetron (ZOFRAN) 4 MG tablet Take 4 mg by mouth every 8 (eight) hours as needed for nausea.       Marland Kitchen oxycodone (OXY-IR) 5 MG capsule Take 5 mg by mouth every 4 (four) hours as needed.      . simvastatin (ZOCOR) 20 MG tablet Take 1 tablet (20 mg total) by mouth daily at 6 PM.  30 tablet  3  . spironolactone (ALDACTONE) 25 MG tablet Take 12.5 mg by mouth daily.      Marland Kitchen tiotropium (SPIRIVA) 18 MCG inhalation capsule Place 18 mcg into inhaler and inhale daily.      . traMADol (ULTRAM) 50 MG tablet Take 50 mg by mouth every 6 (six) hours as  needed.       No current facility-administered medications for this visit.   Allergies  Allergen Reactions  . Penicillins Other (See Comments)    Swelling , long time ago.     Review of Systems: All systems reviewed and negative except where noted in HPI.   Physical Exam: BP 112/60  Pulse 68  Ht 5' 7.5" (1.715 m)  Wt 214 lb (97.07 kg)  BMI 33.00 kg/m2 Constitutional: Pleasant, black male in no acute distress. HEENT: Normocephalic and atraumatic. Conjunctivae are normal. No scleral icterus. Neck supple.  Cardiovascular: Normal rate, regular rhythm.  Pulmonary/chest: Effort normal and breath sounds normal. No wheezing, rales or rhonchi. Abdominal: Soft, nondistended, nontender. Bowel sounds active throughout. There are no masses palpable. No hepatomegaly. Extremities: no edema Lymphadenopathy: No cervical adenopathy noted. Neurological: Alert and oriented to person place and time. Skin: Skin is warm and dry. No rashes noted. Psychiatric: Normal mood and affect. Behavior is normal.   ASSESSMENT AND PLAN:  25. 63 year old male for colon cancer screening. Patient has multiple, significant medical problems as described in history of present illness and he is at increased risk for a screening colonoscopy. He has no abdominal pain, bowel changes, rectal bleeding or family history of colon cancer. He does have have chronic normocytic anemia which is probably multifactorial. Hemoglobin was at baseline  (10.8) at last check December 2014. Given significant comorbidities will obtain a Cologuard (stool DNA testing for colon cancer) in lieu of colonoscopy. If Cologuard returns abnormal then will likely pursue virtual colonoscopy. Patient and sister agree with this plan  2. Multiple medical problems as described above.

## 2014-02-08 NOTE — Patient Instructions (Signed)
We have given you a booklet for the Cologuard test. You will receive a package ( box) in the mail. This booklet explains the steps for this test you will follow.  Once you have completed the steps for the stool collection and mailed the box back to [employer] ( postage free).  We will contact you with the results.

## 2014-02-09 ENCOUNTER — Encounter: Payer: Self-pay | Admitting: Nurse Practitioner

## 2014-02-09 DIAGNOSIS — Z1211 Encounter for screening for malignant neoplasm of colon: Secondary | ICD-10-CM | POA: Insufficient documentation

## 2014-02-09 DIAGNOSIS — D649 Anemia, unspecified: Secondary | ICD-10-CM | POA: Insufficient documentation

## 2014-02-09 NOTE — Progress Notes (Signed)
Case reviewed with NP. Agree with noninvasive screening in this patient with significant comorbidities.

## 2014-02-19 ENCOUNTER — Encounter: Payer: Self-pay | Admitting: Gastroenterology

## 2014-02-21 ENCOUNTER — Ambulatory Visit: Payer: Self-pay | Admitting: Family

## 2014-02-21 ENCOUNTER — Encounter (HOSPITAL_COMMUNITY): Payer: Self-pay

## 2014-02-27 ENCOUNTER — Ambulatory Visit (HOSPITAL_COMMUNITY)
Admission: RE | Admit: 2014-02-27 | Discharge: 2014-02-27 | Disposition: A | Discharge status: Home / Self Care | Payer: Medicaid Other | Source: Ambulatory Visit | Attending: Cardiology | Admitting: Cardiology

## 2014-02-27 ENCOUNTER — Encounter (HOSPITAL_COMMUNITY): Payer: Self-pay | Admitting: Vascular Surgery

## 2014-02-27 ENCOUNTER — Encounter (HOSPITAL_COMMUNITY): Payer: Self-pay

## 2014-02-27 VITALS — BP 110/70 | HR 84 | Wt 214.0 lb

## 2014-02-27 DIAGNOSIS — I509 Heart failure, unspecified: Secondary | ICD-10-CM | POA: Diagnosis not present

## 2014-02-27 DIAGNOSIS — I10 Essential (primary) hypertension: Secondary | ICD-10-CM | POA: Diagnosis not present

## 2014-02-27 DIAGNOSIS — E119 Type 2 diabetes mellitus without complications: Secondary | ICD-10-CM | POA: Diagnosis not present

## 2014-02-27 DIAGNOSIS — I2510 Atherosclerotic heart disease of native coronary artery without angina pectoris: Secondary | ICD-10-CM | POA: Insufficient documentation

## 2014-02-27 DIAGNOSIS — I5022 Chronic systolic (congestive) heart failure: Secondary | ICD-10-CM | POA: Diagnosis not present

## 2014-02-27 DIAGNOSIS — J438 Other emphysema: Secondary | ICD-10-CM | POA: Diagnosis not present

## 2014-02-27 DIAGNOSIS — Z951 Presence of aortocoronary bypass graft: Secondary | ICD-10-CM | POA: Diagnosis not present

## 2014-02-27 DIAGNOSIS — J90 Pleural effusion, not elsewhere classified: Secondary | ICD-10-CM | POA: Insufficient documentation

## 2014-02-27 DIAGNOSIS — F17200 Nicotine dependence, unspecified, uncomplicated: Secondary | ICD-10-CM | POA: Insufficient documentation

## 2014-02-27 DIAGNOSIS — I739 Peripheral vascular disease, unspecified: Secondary | ICD-10-CM | POA: Insufficient documentation

## 2014-02-27 MED ORDER — CARVEDILOL 3.125 MG PO TABS: 9.3750 mg | ORAL_TABLET | Freq: Two times a day (BID) | ORAL | Status: DC

## 2014-02-27 MED ORDER — FUROSEMIDE 40 MG PO TABS: ORAL_TABLET | ORAL | Status: DC

## 2014-02-27 MED ORDER — SPIRONOLACTONE 25 MG PO TABS: 25.0000 mg | ORAL_TABLET | Freq: Every day | ORAL | Status: DC

## 2014-02-27 NOTE — Progress Notes (Signed)
Patient ID: [name] [name], male   DOB: [birth year], 63 y.o.   MRN: [id number] PCP: [name]) Cardiac Surgeon: Dr [name] [name] Pulmonologist: Dr. Lamonte [name] EP: Dr. Caryl [name]   HPI: [name] [name] is a 63 yo with history of COPD, HTN, 3V CAD s/p CABG x 5  (04/2013: LIMA to LAD, SVG to second diagonal, SVG to ramus intermediate and obtuse marginal 2, SVG to posterior descending), ICM, chronic systolic HF, COPD,DM2, PVD and tobacco abuse.   ECHO 06/26/13 EF 20-25% RV mild HK ECHO 10/10/13 EF 20-25% restrictive filling pattern. Moderate RV dysfunction. Mild MR.  He saw Dr. Caryl [name] for CRT-D, but this was placed on hold due to loculated left pleural effusion.  He is undergoing workup for this with Dr. Lamonte [name].  CT showed a partially loculated left pleural effusion but no mass.  He is still smoking.  Weight is stable.  He has atypical, nonexertional chest pain.  He is short of breath walking up steps and walks about 1/2 mile/day on flat ground without dyspnea.  He has some abdominal bloating and feels like he is retaining fluid.    ABIs 11/07/13 - RABI .63 moderate arterial occlusive dz,  - LABI .78 moderate arterial occlusive dz - R toe BI <.69 abnl - L toe BI < .69 abnl  Labs 09/19/13 K 4.2 Creatinine 1.1          11/02/13 K 4.0, Creatinine 1.12          12/08/13 K 3.7 Creatinine 1.01           8/15 K 4.4, creatinine 1.54, LDL 23, HDL 36  SH: Lives with his girlfriend.  Smokes 7-8 cigarette per day. Does not drink alcohol  FH: 2 brothers died from heart attacks, Mother DM, Dad HTN   ROS: All systems negative except as listed in HPI, PMH and Problem List.  Past Medical History  Diagnosis Date  . COPD (chronic obstructive pulmonary disease)   . Gout   . Emphysema   . Hypertension   . Hyperlipidemia   . Tobacco abuse     30+ pack-year history  . Diabetes mellitus without complication   . Chronic systolic CHF (congestive heart failure)     a. 04/2013 Echo: EF 15-20%, diff HK, sev HK of inf and ant  myocardium, dilated LA,  b. EF 20-25% with restrictive filling pattern, mod RV dysfx, mild MR (10/10/2013)  . CAD (coronary artery disease)     a. 04/2013 Cath: LM 10, LAD 40p, 45m, D1 50p, LCX 60p, 36m/100m, OM1 50, OM2 100 L-L collats, RCA 100p;  b. CABG x 5: LIMA->LAD, VG->D2, VG->RI->OM2, VG->PDA.  . Transaminitis     a. 04/2013 Abd U/S and CT unremarkable, hepatitis panel neg -->felt to be 2/2 acute R heart failure.  . Ischemic cardiomyopathy   . PVD (peripheral vascular disease)     a. (10/2013) ABIs: RIGHT 0.63, Waveforms: monophasic;  LEFT 0.78, Waveforms: monophasic    Current Outpatient Prescriptions  Medication Sig Dispense Refill  . albuterol (PROVENTIL HFA;VENTOLIN HFA) 108 (90 BASE) MCG/ACT inhaler Inhale 2 puffs into the lungs every 4 (four) hours as needed. For shortness of breath       . allopurinol (ZYLOPRIM) 300 MG tablet Take 300 mg by mouth daily.      Marland Kitchen aspirin 81 MG tablet Take 1 tablet (81 mg total) by mouth daily.  30 tablet  11  . b complex vitamins tablet Take 1 tablet by mouth daily.      Marland Kitchen  carvedilol (COREG) 3.125 MG tablet Take 3 tablets (9.375 mg total) by mouth 2 (two) times daily with a meal.  120 tablet  3  . colchicine 0.6 MG tablet Take 0.6-1.2 mg by mouth daily. Take 2 tablets at the onset of gout symptoms, then take 1 tablet in an hour for a max of 3 tablets per 24 hours      . furosemide (LASIX) 40 MG tablet Take 1 & 1/2 tabs in AM and take 1 tab in PM  75 tablet  3  . gabapentin (NEURONTIN) 300 MG capsule Take 300 mg by mouth 2 (two) times daily.      Marland Kitchen KLOR-CON M20 20 MEQ tablet TAKE 1 TABLET (20 MEQ TOTAL) BY MOUTH DAILY.  30 tablet  5  . lisinopril (PRINIVIL,ZESTRIL) 2.5 MG tablet Take 1 tablet (2.5 mg total) by mouth 2 (two) times daily.  60 tablet  3  . lubiprostone (AMITIZA) 24 MCG capsule Take 24 mcg by mouth 2 (two) times daily with a meal.      . nitroGLYCERIN (NITROSTAT) 0.4 MG SL tablet Place 0.4 mg under the tongue.      Marland Kitchen Olopatadine HCl  (PATADAY) 0.2 % SOLN Apply 1 drop to eye.      Marland Kitchen omeprazole (PRILOSEC) 20 MG capsule Take 20 mg by mouth daily.      . ondansetron (ZOFRAN) 4 MG tablet Take 4 mg by mouth every 8 (eight) hours as needed for nausea.       Marland Kitchen oxycodone (OXY-IR) 5 MG capsule Take 5 mg by mouth every 4 (four) hours as needed.      . simvastatin (ZOCOR) 20 MG tablet Take 1 tablet (20 mg total) by mouth daily at 6 PM.  30 tablet  3  . spironolactone (ALDACTONE) 25 MG tablet Take 1 tablet (25 mg total) by mouth daily.  30 tablet  3  . tiotropium (SPIRIVA) 18 MCG inhalation capsule Place 18 mcg into inhaler and inhale daily.      . traMADol (ULTRAM) 50 MG tablet Take 50 mg by mouth every 6 (six) hours as needed.       No current facility-administered medications for this encounter.    Filed Vitals:   02/27/14 1016  BP: 110/70  Pulse: 84  Weight: 214 lb (97.07 kg)  SpO2: 98%    PHYSICAL EXAM: General:  Elderly. No resp difficulty. Walks with cane. HEENT: normal Neck: supple. JVP 8. Carotids 2+ bilaterally; no bruits. No lymphadenopathy or thryomegaly appreciated. Cor: PMI normal. Heart regular S1/S2, no S3/S4, 1/6 HSM LLSB. Lungs: Decreased breath sounds left base.  Abdomen: soft, nontender.  Mild abdominal distention. No hepatosplenomegaly. No bruits or masses. Good bowel sounds. Extremities: no cyanosis, clubbing, rash, edema Neuro: alert & orientedx3, cranial nerves grossly intact. Moves all 4 extremities w/o difficulty. Affect pleasant.  ASSESSMENT & PLAN:  1. CAD s/p recent CABG: Stable, no chest pain. Continue aspirin, statin and BB.  Good lipids in 8/15 (goal LDL < 70).  2. Chronic systolic HF: EF 33% 2/62/18.  Evaluated by Dr [name] [name] with plan for CRT-D eventually.  If he has no functional improvement will need CPX. CRT-D implantation put on hold while completing pulmonary workup for loculated left pleural effusion.  NYHA II-III symptoms with mild volume overload (abdominal distention, mild JVD).  -  Today, I will increase Lasix to 60 qam/40 qpm and increase spironolactone to 25 mg daily.  Recent BMET with creatinine increased a bit up to 1.5.  Will need  to follow creatinine closely, repeat BMET in 2 wks.  - Increase Coreg to 9.375 mg mg BID - Continue lisinopril to 2.5 mg BID.  - I will check with Dr [name] [name] regarding pulmonary workup. If he looks stable, will arrange CRT-D with Dr. Caryl [name]. - Reinforced the need and importance of daily weights, a low sodium diet, and fluid restriction (less than 2 L a day). Instructed to call the HF clinic if weight increases more than 3 lbs overnight or 5 lbs in a week.  3. Current Smoker:  Encouraged to stop smoking.  4. PVD: ABIs in 10/2013 with moderate bilateral arterial occlusive disease. Encouraged to quit smoking and continue medical treatment and walking program. He has follow up with Dr Scot Dock scheduled. He is on ASA and statin.  5. Pulmonary: Baseline COPD.  He has a loculated left pleural effusion, no enough fluid for thoracentesis.  CT chest showed no definite tumor.  I will contact Dr. Lamonte [name].  If no further workup needed, will arrange to have CRT-D implanted.   Loralie Champagne 02/27/2014

## 2014-02-27 NOTE — Patient Instructions (Signed)
Increase Carvedilol to 9.375 mg (3 tabs) Twice daily   Increase Spironolactone 25 mg (1 tab) daily  Increase Furosemide (Lasix) to 60 mg (1 & 1/2 tabs) in AM and 40 mg (1 tab) in PM  Labs in 2 weeks  Your physician recommends that you schedule a follow-up appointment in: 2 months

## 2014-03-12 ENCOUNTER — Encounter: Payer: Self-pay | Admitting: Family

## 2014-03-13 ENCOUNTER — Encounter: Payer: Self-pay | Admitting: Family

## 2014-03-13 ENCOUNTER — Ambulatory Visit (HOSPITAL_COMMUNITY)
Admission: RE | Admit: 2014-03-13 | Discharge: 2014-03-13 | Disposition: A | Discharge status: Home / Self Care | Payer: Medicaid Other | Source: Ambulatory Visit | Attending: Family | Admitting: Family

## 2014-03-13 ENCOUNTER — Ambulatory Visit (INDEPENDENT_AMBULATORY_CARE_PROVIDER_SITE_OTHER): Payer: Medicaid Other | Admitting: Family

## 2014-03-13 VITALS — BP 105/68 | HR 74 | Resp 16 | Ht 67.5 in | Wt 212.0 lb

## 2014-03-13 DIAGNOSIS — I70219 Atherosclerosis of native arteries of extremities with intermittent claudication, unspecified extremity: Secondary | ICD-10-CM | POA: Diagnosis not present

## 2014-03-13 DIAGNOSIS — M79605 Pain in left leg: Secondary | ICD-10-CM

## 2014-03-13 DIAGNOSIS — M545 Low back pain, unspecified: Secondary | ICD-10-CM | POA: Insufficient documentation

## 2014-03-13 DIAGNOSIS — E785 Hyperlipidemia, unspecified: Secondary | ICD-10-CM | POA: Diagnosis not present

## 2014-03-13 DIAGNOSIS — E119 Type 2 diabetes mellitus without complications: Secondary | ICD-10-CM | POA: Diagnosis not present

## 2014-03-13 DIAGNOSIS — I10 Essential (primary) hypertension: Secondary | ICD-10-CM | POA: Diagnosis not present

## 2014-03-13 DIAGNOSIS — I739 Peripheral vascular disease, unspecified: Secondary | ICD-10-CM | POA: Insufficient documentation

## 2014-03-13 DIAGNOSIS — M79609 Pain in unspecified limb: Secondary | ICD-10-CM

## 2014-03-13 NOTE — Progress Notes (Signed)
[facility] [place] HISTORY AND PHYSICAL -PAD  History of Present Illness [name] [name] is a 63 y.o. male patient of Dr. Scot Dock who developed a blister on both feet approximately Sept., 2014. The blister on the left foot healed. He was having a persistent wound in the right foot and was referred for vascular consultation. He did not remember any specific injury to the foot. Dr. Scot Dock did not get any clear-cut history of claudication although it sounded like his activity was fairly limited. He did describe some rest pain in the right foot. However, this seems to be improving as the wound on his foot has healed. He does have a history of tobacco use but is trying to quit currently.  At his Dec., 2014 visit with Dr. 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 However, the wound on the dorsum of his right foot had healed at that point. His symptoms in the right foot were improving. For this reason Dr. Scot Dock held off on arteriography. He has had veins taken from both legs for his 5 vessel CABG in November, 2014. Based on his duplex, it is unlikely that he would have disease amenable to angioplasty on the right as the disease is fairly diffuse.  He has pain at posterior aspect of both calves and his low back after about 5-7 minutes walking on a treadmill, relieved by rest, he denies any recurring wounds, the past wounds on his right foot has healed.  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  He goes to cardiac rehab 3 days/week, walks 1-3 miles daily.  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  He has CAD, denies any history of stroke or TIA.   Pt has not had previous intervention for PAD .   The patient reports New Medical or Surgical History: that he is supposed to have a cardiac defibrillator placed, awaiting to hear when.   Pt Diabetic:  Yes, A1C April, 2015 was 6.3  Pt smoker: smoker (1/2 ppd, started smoking at age 54 yrs), he also gets second hand smoke from his girlfriend who smokes in the house  Pt meds include:  Statin :Yes  Betablocker: Yes  ASA: Yes  Other anticoagulants/antiplatelets: no   Past Medical History  Diagnosis Date  . COPD (chronic obstructive pulmonary disease)   . Gout   . Emphysema   . Hypertension   . Hyperlipidemia   . Tobacco abuse     30+ pack-year history  . Diabetes mellitus without complication   . Chronic systolic CHF (congestive heart failure)     a. 04/2013 Echo: EF 15-20%, diff HK, sev HK of inf and ant myocardium, dilated LA,  b. EF 20-25% with restrictive filling pattern, mod RV dysfx, mild MR (10/10/2013)  . CAD (coronary artery disease)     a. 04/2013 Cath: LM 10, LAD 40p, 34m, D1 50p, LCX 60p, 28m/100m, OM1 50, OM2 100 L-L collats, RCA 100p;  b. CABG x 5: LIMA->LAD, VG->D2, VG->RI->OM2, VG->PDA.  . Transaminitis     a. 04/2013 Abd U/S and CT unremarkable, hepatitis panel neg -->felt to be 2/2 acute R heart failure.  . Ischemic cardiomyopathy   . PVD (peripheral vascular disease)     a. (10/2013) ABIs: RIGHT 0.63, Waveforms: monophasic;  LEFT 0.78, Waveforms: monophasic    Social History History  Substance Use Topics  . Smoking  status: Current Some Day Smoker -- 1.00 packs/day for 30 years    Types: Cigarettes  . Smokeless tobacco: Never Used     Comment: smokes 7-8 cigarettes a day  . Alcohol Use: No    Family History Family History  Problem Relation Age of Onset  . Diabetes Mother   . Hypertension    . Heart attack      Brothers    Past Surgical History  Procedure Laterality Date  . Appendectomy  as a child  . Multiple extractions with alveoloplasty N/A 05/03/2013    Procedure: Extraction of tooth #s 3,4,5,6,8,9,27 with alveoloplast and maxillary left osseous tuberosity reduction;  Surgeon: Lenn Cal, DDS;  Location: [facility];  Service: Oral Surgery;   Laterality: N/A;  . Coronary artery bypass graft N/A 05/05/2013    Procedure: CORONARY ARTERY BYPASS GRAFTING (CABG) TIMES FIVE USING LEFT INTERNAL MAMMARY ARTERY AND RIGHT AND LEFT SAPHENOUS LEG VEIN HARVESTED ENDOSCOPICALLY;  Surgeon: Melrose Nakayama, MD;  Location: [facility];  Service: Open Heart Surgery;  Laterality: N/A;    Allergies  Allergen Reactions  . Penicillins Other (See Comments)    Swelling , long time ago.     Current Outpatient Prescriptions  Medication Sig Dispense Refill  . albuterol (PROVENTIL HFA;VENTOLIN HFA) 108 (90 BASE) MCG/ACT inhaler Inhale 2 puffs into the lungs every 4 (four) hours as needed. For shortness of breath       . allopurinol (ZYLOPRIM) 300 MG tablet Take 300 mg by mouth daily.      Marland Kitchen aspirin 81 MG tablet Take 1 tablet (81 mg total) by mouth daily.  30 tablet  11  . atorvastatin (LIPITOR) 20 MG tablet Take 20 mg by mouth daily.      Marland Kitchen b complex vitamins tablet Take 1 tablet by mouth daily.      . colchicine 0.6 MG tablet Take 0.6-1.2 mg by mouth daily. Take 2 tablets at the onset of gout symptoms, then take 1 tablet in an hour for a max of 3 tablets per 24 hours      . furosemide (LASIX) 40 MG tablet Take 1 & 1/2 tabs in AM and take 1 tab in PM  75 tablet  3  . gabapentin (NEURONTIN) 300 MG capsule Take 300 mg by mouth 2 (two) times daily.      Marland Kitchen KLOR-CON M20 20 MEQ tablet TAKE 1 TABLET (20 MEQ TOTAL) BY MOUTH DAILY.  30 tablet  5  . lisinopril (PRINIVIL,ZESTRIL) 2.5 MG tablet Take 1 tablet (2.5 mg total) by mouth 2 (two) times daily.  60 tablet  3  . lubiprostone (AMITIZA) 24 MCG capsule Take 24 mcg by mouth 2 (two) times daily with a meal.      . nitroGLYCERIN (NITROSTAT) 0.4 MG SL tablet Place 0.4 mg under the tongue.      Marland Kitchen Olopatadine HCl (PATADAY) 0.2 % SOLN Apply 1 drop to eye.      Marland Kitchen omeprazole (PRILOSEC) 20 MG capsule Take 20 mg by mouth daily.      . ondansetron (ZOFRAN) 4 MG tablet Take 4 mg by mouth every 8 (eight) hours as needed for  nausea.       Marland Kitchen oxycodone (OXY-IR) 5 MG capsule Take 5 mg by mouth every 4 (four) hours as needed.      Marland Kitchen spironolactone (ALDACTONE) 25 MG tablet Take 1 tablet (25 mg total) by mouth daily.  30 tablet  3  . tiotropium (SPIRIVA) 18 MCG inhalation capsule Place  18 mcg into inhaler and inhale daily.      . traMADol (ULTRAM) 50 MG tablet Take 50 mg by mouth every 6 (six) hours as needed.      . carvedilol (COREG) 3.125 MG tablet Take 3 tablets (9.375 mg total) by mouth 2 (two) times daily with a meal.  120 tablet  3  . simvastatin (ZOCOR) 20 MG tablet Take 1 tablet (20 mg total) by mouth daily at 6 PM.  30 tablet  3   No current facility-administered medications for this visit.    ROS: See HPI for pertinent positives and negatives.   Physical Examination  Filed Vitals:   03/13/14 1239  BP: 105/68  Pulse: 74  Resp: 16  Height: 5' 7.5" (1.715 m)  Weight: 212 lb (96.163 kg)  SpO2: 100%   Body mass index is 32.69 kg/(m^2).  General: A&O x 3, WDWN, obese male.  Gait: using cane, slow and deliberate  Eyes: Pupils =.  Pulmonary: CTAB, without wheezes , rales or rhonchi, decreased air movement in posterior fields  Cardiac: regular Rythm , without detected murmur.   Carotid Bruits  Left  Right    Negative  Negative    Aorta is not palpable.  Radial pulses: are 2+ palpable and =   VASCULAR EXAM:  Extremities without ischemic changes  without Gangrene; without open wounds. Feet are slightly ruddy, skin is dry, evidence of healed ulcer at dorsum of right foot, onychomycosis all toenails   LE Pulses  LEFT  RIGHT   FEMORAL  not palpable  not palpable   POPLITEAL  not palpable  not palpable   POSTERIOR TIBIAL  not palpable, monophasic by Doppler  not palpable, monophasic by Doppler  DORSALIS PEDIS  ANTERIOR TIBIAL  not palpable, biphasic by Doppler  not palpable, monophasic by Doppler  PERONEAL  not Palpable  not Palpable    Abdomen: soft, NT, no masses palpated.  Skin: no rashes, no  ulcers noted.  Musculoskeletal: no muscle wasting or atrophy.  Neurologic: A&O X 3; Appropriate Affect ; SENSATION: normal; MOTOR FUNCTION: moving all extremities equally, motor strength 5/5 in UE's, 4/5 in LE's. Speech is fluent/normal. CN 2-12 intact.    Non-Invasive Vascular Imaging: DATE: 03/13/2014 ABI: RIGHT 0.60, Waveforms: monophasic;  LEFT 0.80, Waveforms: monophasic and biphasic Previous (11/07/13) ABI's: Right: 0.63, Left: 0.78   ASSESSMENT: [name] [name] is a 63 y.o. male who presents with stable bilateral LE moderate arterial occlusive disease in his right leg, mild in his left leg, in the background of controlled DM and long term tobacco use. He has an equal degree of pain at the posterior aspects of both calves after walking on a treadmill for 5-7 minutes, relieved by rest.  Bilateral LE arterial Duplex result from Dec., 2014 showed diffuse plaque throughout lower extremities. The ulcers in his feet have healed. He has seen a podiatrist in the past for toenail trimming, advised to return every 3-4 months for this and diabetic feet inspection.  PLAN:  I discussed in depth with the patient the nature of atherosclerosis, and emphasized the importance of maximal medical management including strict control of blood pressure, blood glucose, and lipid levels, obtaining regular exercise, and cessation of smoking.  The patient is aware that without maximal medical management the underlying atherosclerotic disease process will progress, limiting the benefit of any interventions.  He was again counseled re smoking cessation.  Based on the patient's vascular studies and examination, pt will return to clinic in 1 year  for ABI's.   The patient was given information about PAD including signs, symptoms, treatment, what symptoms should prompt the patient to seek immediate medical care, and risk reduction measures to take.  Clemon Chambers, RN, MSN, FNP-C [facility] Phone: [phone]  Clinic MD: Early  03/13/2014 12:48 PM

## 2014-03-13 NOTE — Addendum Note (Signed)
Addended by: Mena Goes on: 03/13/2014 04:49 PM   Modules accepted: Orders

## 2014-03-13 NOTE — Patient Instructions (Signed)
Peripheral Vascular Disease Peripheral Vascular Disease (PVD), also called Peripheral Arterial Disease (PAD), is a circulation problem caused by cholesterol (atherosclerotic plaque) deposits in the arteries. PVD commonly occurs in the lower extremities (legs) but it can occur in other areas of the body, such as your arms. The cholesterol buildup in the arteries reduces blood flow which can cause pain and other serious problems. The presence of PVD can place a person at risk for Coronary Artery Disease (CAD).  CAUSES  Causes of PVD can be many. It is usually associated with more than one risk factor such as:   High Cholesterol.  Smoking.  Diabetes.  Lack of exercise or inactivity.  High blood pressure (hypertension).  Obesity.  Family history. SYMPTOMS   When the lower extremities are affected, patients with PVD may experience:  Leg pain with exertion or physical activity. This is called INTERMITTENT CLAUDICATION. This may present as cramping or numbness with physical activity. The location of the pain is associated with the level of blockage. For example, blockage at the abdominal level (distal abdominal aorta) may result in buttock or hip pain. Lower leg arterial blockage may result in calf pain.  As PVD becomes more severe, pain can develop with less physical activity.  In people with severe PVD, leg pain may occur at rest.  Other PVD signs and symptoms:  Leg numbness or weakness.  Coldness in the affected leg or foot, especially when compared to the other leg.  A change in leg color.  Patients with significant PVD are more prone to ulcers or sores on toes, feet or legs. These may take longer to heal or may reoccur. The ulcers or sores can become infected.  If signs and symptoms of PVD are ignored, gangrene may occur. This can result in the loss of toes or loss of an entire limb.  Not all leg pain is related to PVD. Other medical conditions can cause leg pain such  as:  Blood clots (embolism) or Deep Vein Thrombosis.  Inflammation of the blood vessels (vasculitis).  Spinal stenosis. DIAGNOSIS  Diagnosis of PVD can involve several different types of tests. These can include:  Pulse Volume Recording Method (PVR). This test is simple, painless and does not involve the use of X-rays. PVR involves measuring and comparing the blood pressure in the arms and legs. An ABI (Ankle-Brachial Index) is calculated. The normal ratio of blood pressures is 1. As this number becomes smaller, it indicates more severe disease.  < 0.95 - indicates significant narrowing in one or more leg vessels.  <0.8 - there will usually be pain in the foot, leg or buttock with exercise.  <0.4 - will usually have pain in the legs at rest.  <0.25 - usually indicates limb threatening PVD.  Doppler detection of pulses in the legs. This test is painless and checks to see if you have a pulses in your legs/feet.  A dye or contrast material (a substance that highlights the blood vessels so they show up on x-ray) may be given to help your caregiver better see the arteries for the following tests. The dye is eliminated from your body by the kidney's. Your caregiver may order blood work to check your kidney function and other laboratory values before the following tests are performed:  Magnetic Resonance Angiography (MRA). An MRA is a picture study of the blood vessels and arteries. The MRA machine uses a large magnet to produce images of the blood vessels.  Computed Tomography Angiography (CTA). A CTA   is a specialized x-ray that looks at how the blood flows in your blood vessels. An IV may be inserted into your arm so contrast dye can be injected.  Angiogram. Is a procedure that uses x-rays to look at your blood vessels. This procedure is minimally invasive, meaning a small incision (cut) is made in your groin. A small tube (catheter) is then inserted into the artery of your groin. The catheter  is guided to the blood vessel or artery your caregiver wants to examine. Contrast dye is injected into the catheter. X-rays are then taken of the blood vessel or artery. After the images are obtained, the catheter is taken out. TREATMENT  Treatment of PVD involves many interventions which may include:  Lifestyle changes:  Quitting smoking.  Exercise.  Following a low fat, low cholesterol diet.  Control of diabetes.  Foot care is very important to the PVD patient. Good foot care can help prevent infection.  Medication:  Cholesterol-lowering medicine.  Blood pressure medicine.  Anti-platelet drugs.  Certain medicines may reduce symptoms of Intermittent Claudication.  Interventional/Surgical options:  Angioplasty. An Angioplasty is a procedure that inflates a balloon in the blocked artery. This opens the blocked artery to improve blood flow.  Stent Implant. A wire mesh tube (stent) is placed in the artery. The stent expands and stays in place, allowing the artery to remain open.  Peripheral Bypass Surgery. This is a surgical procedure that reroutes the blood around a blocked artery to help improve blood flow. This type of procedure may be performed if Angioplasty or stent implants are not an option. SEEK IMMEDIATE MEDICAL CARE IF:   You develop pain or numbness in your arms or legs.  Your arm or leg turns cold, becomes blue in color.  You develop redness, warmth, swelling and pain in your arms or legs. MAKE SURE YOU:   Understand these instructions.  Will watch your condition.  Will get help right away if you are not doing well or get worse. Document Released: 07/16/2004 Document Revised: 08/31/2011 Document Reviewed: 06/12/2008 ExitCare Patient Information 2015 [employer]. This information is not intended to replace advice given to you by your health care provider. Make sure you discuss any questions you have with your health care provider.   Smoking  Cessation Quitting smoking is important to your health and has many advantages. However, it is not always easy to quit since nicotine is a very addictive drug. Oftentimes, people try 3 times or more before being able to quit. This document explains the best ways for you to prepare to quit smoking. Quitting takes hard work and a lot of effort, but you can do it. ADVANTAGES OF QUITTING SMOKING  You will live longer, feel better, and live better.  Your body will feel the impact of quitting smoking almost immediately.  Within 20 minutes, blood pressure decreases. Your pulse returns to its normal level.  After 8 hours, carbon monoxide levels in the blood return to normal. Your oxygen level increases.  After 24 hours, the chance of having a heart attack starts to decrease. Your breath, hair, and body stop smelling like smoke.  After 48 hours, damaged nerve endings begin to recover. Your sense of taste and smell improve.  After 72 hours, the body is virtually free of nicotine. Your bronchial tubes relax and breathing becomes easier.  After 2 to 12 weeks, lungs can hold more air. Exercise becomes easier and circulation improves.  The risk of having a heart attack, stroke, cancer,   or lung disease is greatly reduced.  After 1 year, the risk of coronary heart disease is cut in half.  After 5 years, the risk of stroke falls to the same as a nonsmoker.  After 10 years, the risk of lung cancer is cut in half and the risk of other cancers decreases significantly.  After 15 years, the risk of coronary heart disease drops, usually to the level of a nonsmoker.  If you are pregnant, quitting smoking will improve your chances of having a healthy baby.  The people you live with, especially any children, will be healthier.  You will have extra money to spend on things other than cigarettes. QUESTIONS TO THINK ABOUT BEFORE ATTEMPTING TO QUIT You may want to talk about your answers with your health care  provider.  Why do you want to quit?  If you tried to quit in the past, what helped and what did not?  What will be the most difficult situations for you after you quit? How will you plan to handle them?  Who can help you through the tough times? Your family? Friends? A health care provider?  What pleasures do you get from smoking? What ways can you still get pleasure if you quit? Here are some questions to ask your health care provider:  How can you help me to be successful at quitting?  What medicine do you think would be best for me and how should I take it?  What should I do if I need more help?  What is smoking withdrawal like? How can I get information on withdrawal? GET READY  Set a quit date.  Change your environment by getting rid of all cigarettes, ashtrays, matches, and lighters in your home, car, or work. Do not let people smoke in your home.  Review your past attempts to quit. Think about what worked and what did not. GET SUPPORT AND ENCOURAGEMENT You have a better chance of being successful if you have help. You can get support in many ways.  Tell your family, friends, and coworkers that you are going to quit and need their support. Ask them not to smoke around you.  Get individual, group, or telephone counseling and support. Programs are available at local hospitals and health centers. Call your local health department for information about programs in your area.  Spiritual beliefs and practices may help some smokers quit.  Download a "quit meter" on your computer to keep track of quit statistics, such as how long you have gone without smoking, cigarettes not smoked, and money saved.  Get a self-help book about quitting smoking and staying off tobacco. LEARN NEW SKILLS AND BEHAVIORS  Distract yourself from urges to smoke. Talk to someone, go for a walk, or occupy your time with a task.  Change your normal routine. Take a different route to work. Drink tea  instead of coffee. Eat breakfast in a different place.  Reduce your stress. Take a hot bath, exercise, or read a book.  Plan something enjoyable to do every day. Reward yourself for not smoking.  Explore interactive web-based programs that specialize in helping you quit. GET MEDICINE AND USE IT CORRECTLY Medicines can help you stop smoking and decrease the urge to smoke. Combining medicine with the above behavioral methods and support can greatly increase your chances of successfully quitting smoking.  Nicotine replacement therapy helps deliver nicotine to your body without the negative effects and risks of smoking. Nicotine replacement therapy includes nicotine gum, lozenges, inhalers,   nasal sprays, and skin patches. Some may be available over-the-counter and others require a prescription.  Antidepressant medicine helps people abstain from smoking, but how this works is unknown. This medicine is available by prescription.  Nicotinic receptor partial agonist medicine simulates the effect of nicotine in your brain. This medicine is available by prescription. Ask your health care provider for advice about which medicines to use and how to use them based on your health history. Your health care provider will tell you what side effects to look out for if you choose to be on a medicine or therapy. Carefully read the information on the package. Do not use any other product containing nicotine while using a nicotine replacement product.  RELAPSE OR DIFFICULT SITUATIONS Most relapses occur within the first 3 months after quitting. Do not be discouraged if you start smoking again. Remember, most people try several times before finally quitting. You may have symptoms of withdrawal because your body is used to nicotine. You may crave cigarettes, be irritable, feel very hungry, cough often, get headaches, or have difficulty concentrating. The withdrawal symptoms are only temporary. They are strongest when you  first quit, but they will go away within 10-14 days. To reduce the chances of relapse, try to:  Avoid drinking alcohol. Drinking lowers your chances of successfully quitting.  Reduce the amount of caffeine you consume. Once you quit smoking, the amount of caffeine in your body increases and can give you symptoms, such as a rapid heartbeat, sweating, and anxiety.  Avoid smokers because they can make you want to smoke.  Do not let weight gain distract you. Many smokers will gain weight when they quit, usually less than 10 pounds. Eat a healthy diet and stay active. You can always lose the weight gained after you quit.  Find ways to improve your mood other than smoking. FOR MORE INFORMATION  www.smokefree.gov  Document Released: 06/02/2001 Document Revised: 10/23/2013 Document Reviewed: 09/17/2011 [employer] Patient Information 2015 [employer]. This information is not intended to replace advice given to you by your health care provider. Make sure you discuss any questions you have with your health care provider.  

## 2014-03-15 ENCOUNTER — Encounter: Payer: Self-pay | Admitting: Cardiology

## 2014-03-16 ENCOUNTER — Other Ambulatory Visit (HOSPITAL_COMMUNITY): Payer: Self-pay | Admitting: Cardiology

## 2014-03-16 DIAGNOSIS — I509 Heart failure, unspecified: Secondary | ICD-10-CM

## 2014-03-16 MED ORDER — LISINOPRIL 2.5 MG PO TABS: 2.5000 mg | ORAL_TABLET | Freq: Two times a day (BID) | ORAL | Status: DC

## 2014-03-29 ENCOUNTER — Telehealth: Payer: Self-pay | Admitting: *Deleted

## 2014-03-29 NOTE — Telephone Encounter (Signed)
I called [employer] and asked about this patient.  They said the patient did not get the sample back to them within 72 hours. He had done this two times. He is not mailing the package back through [employer] in time. There is a 72 hour limit.

## 2014-03-30 NOTE — Telephone Encounter (Signed)
SENT TO ME IN ERROR...sounds like no more attempts at cologuard

## 2014-03-30 NOTE — Telephone Encounter (Signed)
Yes I got a notice that this had happened a 2nd time. Will talk to you about it on Monday to see what we can figure out. Thanks

## 2014-04-03 NOTE — Telephone Encounter (Signed)
[name] [name], does patient realize that there is a 72 hour limit on the sample?  If he didn't know this then perhaps problem can be corrected and he can try one last time. He is HIGH risk for colonoscopy. If he cannot get the package back in appropriate time frame then there isn't much I can do. Thanks

## 2014-04-09 NOTE — Telephone Encounter (Signed)
I called [employer] and I was told if the patient wants to have a kit sent to him again and he thinks he can mail it before the 72 hour limit they will mail him another kit.  I told the rep at [employer] I will call the patient first. I called the patient but he did not answer. He has no answering device and no cell phone.

## 2014-04-10 ENCOUNTER — Other Ambulatory Visit (HOSPITAL_COMMUNITY): Payer: Self-pay | Admitting: Cardiology

## 2014-04-10 DIAGNOSIS — I5022 Chronic systolic (congestive) heart failure: Secondary | ICD-10-CM

## 2014-04-10 MED ORDER — LISINOPRIL 2.5 MG PO TABS: 2.5000 mg | ORAL_TABLET | Freq: Two times a day (BID) | ORAL | Status: DC

## 2014-04-18 NOTE — Telephone Encounter (Signed)
Called the patient on home number. No answer and no answering device.

## 2014-05-01 ENCOUNTER — Ambulatory Visit (HOSPITAL_COMMUNITY)
Admission: RE | Admit: 2014-05-01 | Discharge: 2014-05-01 | Disposition: A | Discharge status: Home / Self Care | Payer: Medicaid Other | Source: Ambulatory Visit | Attending: Cardiology | Admitting: Cardiology

## 2014-05-01 VITALS — BP 126/78 | HR 95 | Wt 222.0 lb

## 2014-05-01 DIAGNOSIS — I739 Peripheral vascular disease, unspecified: Secondary | ICD-10-CM | POA: Insufficient documentation

## 2014-05-01 DIAGNOSIS — Z720 Tobacco use: Secondary | ICD-10-CM | POA: Insufficient documentation

## 2014-05-01 DIAGNOSIS — J449 Chronic obstructive pulmonary disease, unspecified: Secondary | ICD-10-CM | POA: Insufficient documentation

## 2014-05-01 DIAGNOSIS — I5022 Chronic systolic (congestive) heart failure: Secondary | ICD-10-CM | POA: Diagnosis not present

## 2014-05-01 DIAGNOSIS — J438 Other emphysema: Secondary | ICD-10-CM

## 2014-05-01 DIAGNOSIS — Z951 Presence of aortocoronary bypass graft: Secondary | ICD-10-CM

## 2014-05-01 DIAGNOSIS — I2510 Atherosclerotic heart disease of native coronary artery without angina pectoris: Secondary | ICD-10-CM | POA: Diagnosis not present

## 2014-05-01 DIAGNOSIS — F17200 Nicotine dependence, unspecified, uncomplicated: Secondary | ICD-10-CM

## 2014-05-01 DIAGNOSIS — I70219 Atherosclerosis of native arteries of extremities with intermittent claudication, unspecified extremity: Secondary | ICD-10-CM

## 2014-05-01 DIAGNOSIS — I4891 Unspecified atrial fibrillation: Secondary | ICD-10-CM

## 2014-05-01 LAB — BASIC METABOLIC PANEL
ANION GAP: 16 — AB (ref 5–15)
BUN: 30 mg/dL — ABNORMAL HIGH (ref 6–23)
CALCIUM: 9.9 mg/dL (ref 8.4–10.5)
CO2: 19 mEq/L (ref 19–32)
Chloride: 100 mEq/L (ref 96–112)
Creatinine, Ser: 1.23 mg/dL (ref 0.50–1.35)
GFR calc Af Amer: 71 mL/min — ABNORMAL LOW (ref >90)
GFR, EST NON AFRICAN AMERICAN: 61 mL/min — AB (ref >90)
GLUCOSE: 130 mg/dL — AB (ref 70–99)
POTASSIUM: 4.9 meq/L (ref 3.7–5.3)
SODIUM: 135 meq/L — AB (ref 137–147)

## 2014-05-01 LAB — PRO B NATRIURETIC PEPTIDE: Pro B Natriuretic peptide (BNP): 938 pg/mL — ABNORMAL HIGH (ref 0–125)

## 2014-05-01 MED ORDER — LISINOPRIL 5 MG PO TABS: 5.0000 mg | ORAL_TABLET | Freq: Two times a day (BID) | ORAL | Status: DC

## 2014-05-01 MED ORDER — FUROSEMIDE 40 MG PO TABS: 60.0000 mg | ORAL_TABLET | Freq: Two times a day (BID) | ORAL | Status: DC

## 2014-05-01 NOTE — Progress Notes (Signed)
Patient ID: [name] [name], male   DOB: 07-27-1950, 63 y.o.   MRN: [id number] PCP: [name]) Cardiac Surgeon: Dr Roxan Hockey Pulmonologist: Dr. Lamonte Sakai EP: Dr. Caryl Comes   HPI: [name] [name] is a 63 yo with history of COPD, HTN, 3V CAD s/p CABG x 5  (04/2013: LIMA to LAD, SVG to second diagonal, SVG to ramus intermediate and obtuse marginal 2, SVG to posterior descending), ICM, chronic systolic HF, COPD,DM2, PVD and tobacco abuse.   ECHO 06/26/13 EF 20-25% RV mild HK ECHO 10/10/13 EF 20-25% restrictive filling pattern. Moderate RV dysfunction. Mild MR.  He saw Dr. Caryl Comes for CRT-D, but this was placed on hold due to loculated left pleural effusion.  He has had workup for this with Dr. Lamonte Sakai.  CT showed a partially loculated left pleural effusion but no mass. There was no accessible fluid by ultrasound for thoracentesis.  He is still smoking.  Weight is up today but he was weighed wearing a heavy coat.  He is short of breath walking up steps and walks about 1/2 mile/day on flat ground without dyspnea.  Very mild chest tightness after walking 1/2 mile.  He has bilateral calf pain after walking 1/2 mile. No pedal ulcers or rest pain.   ABIs 11/07/13 - RABI .63 moderate arterial occlusive dz,  - LABI .78 moderate arterial occlusive dz - R toe BI <.69 abnl - L toe BI < .69 abnl  Labs 09/19/13 K 4.2 Creatinine 1.1          11/02/13 K 4.0, Creatinine 1.12          12/08/13 K 3.7 Creatinine 1.01           8/15 K 4.4, creatinine 1.54, LDL 23, HDL 36  ECG: NSR, LAFB, RBBB  SH: Lives with his girlfriend.  Smokes 7-8 cigarette per day. Does not drink alcohol  FH: 2 brothers died from heart attacks, Mother DM, Dad HTN   ROS: All systems negative except as listed in HPI, PMH and Problem List.  Past Medical History  Diagnosis Date  . COPD (chronic obstructive pulmonary disease)   . Gout   . Emphysema   . Hypertension   . Hyperlipidemia   . Tobacco abuse     30+ pack-year history  . Diabetes  mellitus without complication   . Chronic systolic CHF (congestive heart failure)     a. 04/2013 Echo: EF 15-20%, diff HK, sev HK of inf and ant myocardium, dilated LA,  b. EF 20-25% with restrictive filling pattern, mod RV dysfx, mild MR (10/10/2013)  . CAD (coronary artery disease)     a. 04/2013 Cath: LM 10, LAD 40p, 51m, D1 50p, LCX 60p, 40m/100m, OM1 50, OM2 100 L-L collats, RCA 100p;  b. CABG x 5: LIMA->LAD, VG->D2, VG->RI->OM2, VG->PDA.  . Transaminitis     a. 04/2013 Abd U/S and CT unremarkable, hepatitis panel neg -->felt to be 2/2 acute R heart failure.  . Ischemic cardiomyopathy   . PVD (peripheral vascular disease)     a. (10/2013) ABIs: RIGHT 0.63, Waveforms: monophasic;  LEFT 0.78, Waveforms: monophasic    Current Outpatient Prescriptions  Medication Sig Dispense Refill  . albuterol (PROVENTIL HFA;VENTOLIN HFA) 108 (90 BASE) MCG/ACT inhaler Inhale 2 puffs into the lungs every 4 (four) hours as needed. For shortness of breath     . allopurinol (ZYLOPRIM) 300 MG tablet Take 300 mg by mouth daily.    Marland Kitchen aspirin 81 MG tablet Take 1 tablet (81 mg total) by  mouth daily. 30 tablet 11  . atorvastatin (LIPITOR) 20 MG tablet Take 20 mg by mouth daily.    Marland Kitchen b complex vitamins tablet Take 1 tablet by mouth daily.    . carvedilol (COREG) 3.125 MG tablet Take 3 tablets (9.375 mg total) by mouth 2 (two) times daily with a meal. 120 tablet 3  . colchicine 0.6 MG tablet Take 0.6-1.2 mg by mouth daily. Take 2 tablets at the onset of gout symptoms, then take 1 tablet in an hour for a max of 3 tablets per 24 hours    . furosemide (LASIX) 40 MG tablet Take 1.5 tablets (60 mg total) by mouth 2 (two) times daily. 90 tablet 3  . gabapentin (NEURONTIN) 300 MG capsule Take 300 mg by mouth 2 (two) times daily.    Marland Kitchen KLOR-CON M20 20 MEQ tablet TAKE 1 TABLET (20 MEQ TOTAL) BY MOUTH DAILY. 30 tablet 5  . lisinopril (PRINIVIL,ZESTRIL) 5 MG tablet Take 1 tablet (5 mg total) by mouth 2 (two) times daily. 60 tablet  3  . lubiprostone (AMITIZA) 24 MCG capsule Take 24 mcg by mouth 2 (two) times daily with a meal.    . nitroGLYCERIN (NITROSTAT) 0.4 MG SL tablet Place 0.4 mg under the tongue.    Marland Kitchen Olopatadine HCl (PATADAY) 0.2 % SOLN Apply 1 drop to eye.    Marland Kitchen omeprazole (PRILOSEC) 20 MG capsule Take 20 mg by mouth daily.    . ondansetron (ZOFRAN) 4 MG tablet Take 4 mg by mouth every 8 (eight) hours as needed for nausea.     Marland Kitchen oxycodone (OXY-IR) 5 MG capsule Take 5 mg by mouth every 4 (four) hours as needed.    Marland Kitchen spironolactone (ALDACTONE) 25 MG tablet Take 1 tablet (25 mg total) by mouth daily. 30 tablet 3  . tiotropium (SPIRIVA) 18 MCG inhalation capsule Place 18 mcg into inhaler and inhale daily.    . traMADol (ULTRAM) 50 MG tablet Take 50 mg by mouth every 6 (six) hours as needed.     No current facility-administered medications for this encounter.    Filed Vitals:   05/01/14 0941  BP: 126/78  Pulse: 95  Weight: 222 lb (100.699 kg)  SpO2: 97%    PHYSICAL EXAM: General:  Elderly. No resp difficulty. Walks with cane. HEENT: normal Neck: supple. JVP 8. Carotids 2+ bilaterally; no bruits. No lymphadenopathy or thryomegaly appreciated. Cor: PMI normal. Heart regular S1/S2, no S3/S4, 1/6 HSM LLSB. Lungs: Bilateral rhonchi, decreased breath sounds right base.   Abdomen: soft, nontender.  Mild abdominal distention. No hepatosplenomegaly. No bruits or masses. Good bowel sounds. Extremities: no cyanosis, clubbing, rash.  Trace ankle edema.  Neuro: alert & orientedx3, cranial nerves grossly intact. Moves all 4 extremities w/o difficulty. Affect pleasant.  ASSESSMENT & PLAN:  1. CAD s/p CABG: Stable, mild chest tightness after walking 1/2 mile. Continue aspirin, statin and BB. Good lipids in 8/15 (goal LDL < 70).  2. Chronic systolic HF: EF 19% 1/47/82.  Evaluated by Dr Caryl Comes with plan for ICD versus CRT-D.  Initial plan appears to have been CRT-D but he has RBBB so not sure of benefit beyond ICD alone.   If he has no functional improvement will need CPX. CRT-D implantation was on hold while completing pulmonary workup for loculated left pleural effusion, which seems to be finished.  NYHA II-III symptoms with mild volume overload still.  - Today, I will increase Lasix to 60 mg bid and continue spironolactone 25 mg daily.  BMET  in 2 wks.  - Continue current Coreg.  - Increase lisinopril to 5 mg bid with BMET in 2 wks (as above).   - I will send back to Dr Caryl Comes for ICD versus CRT-D.  - Reinforced the need and importance of daily weights, a low sodium diet, and fluid restriction (less than 2 L a day). Instructed to call the HF clinic if weight increases more than 3 lbs overnight or 5 lbs in a week.  3. Current Smoker:  Encouraged to stop smoking.  He has COPD which likely contributes to his dyspnea.  4. PVD: ABIs in 10/2013 with moderate bilateral arterial occlusive disease. Encouraged to quit smoking and continue medical treatment and walking program. He has follow up with Dr Scot Dock scheduled. He is on ASA and statin.  5. Pulmonary: Baseline COPD.  He has a loculated left pleural effusion, no enough fluid for thoracentesis.  CT chest showed no definite tumor.  It does not appear that further workup is planned at this time. As above, will send to Dr Caryl Comes for ICD.    Loralie Champagne 05/01/2014

## 2014-05-01 NOTE — Patient Instructions (Signed)
Increase Furosemide (Lasix) to 60 mg (1 & 1/2) Twice daily   Increase Lisinopril to 5 mg Twice daily   Labs today  Labs in 2 weeks (bmet, bnp)  You have been referred to Dr Caryl Comes (has seen him before)  We will contact you in 2 months to schedule your next appointment.

## 2014-05-04 NOTE — Addendum Note (Signed)
Encounter addended by: Asencion Gowda, CCT on: 05/04/2014  9:12 AM<BR>     Documentation filed: Charges VN

## 2014-05-15 ENCOUNTER — Ambulatory Visit (HOSPITAL_COMMUNITY)
Admission: RE | Admit: 2014-05-15 | Discharge: 2014-05-15 | Disposition: A | Discharge status: Home / Self Care | Payer: Medicaid Other | Source: Ambulatory Visit | Attending: Cardiology | Admitting: Cardiology

## 2014-05-15 DIAGNOSIS — I5022 Chronic systolic (congestive) heart failure: Secondary | ICD-10-CM | POA: Diagnosis not present

## 2014-05-15 LAB — BASIC METABOLIC PANEL
ANION GAP: 16 — AB (ref 5–15)
BUN: 40 mg/dL — ABNORMAL HIGH (ref 6–23)
CALCIUM: 9.7 mg/dL (ref 8.4–10.5)
CO2: 21 mEq/L (ref 19–32)
CREATININE: 1.44 mg/dL — AB (ref 0.50–1.35)
Chloride: 100 mEq/L (ref 96–112)
GFR, EST AFRICAN AMERICAN: 59 mL/min — AB (ref >90)
GFR, EST NON AFRICAN AMERICAN: 51 mL/min — AB (ref >90)
Glucose, Bld: 111 mg/dL — ABNORMAL HIGH (ref 70–99)
Potassium: 4.6 mEq/L (ref 3.7–5.3)
SODIUM: 137 meq/L (ref 137–147)

## 2014-05-18 ENCOUNTER — Encounter: Payer: Self-pay | Admitting: *Deleted

## 2014-05-18 ENCOUNTER — Ambulatory Visit (INDEPENDENT_AMBULATORY_CARE_PROVIDER_SITE_OTHER): Payer: Medicaid Other | Admitting: Internal Medicine

## 2014-05-18 VITALS — BP 80/58 | HR 83 | Ht 67.0 in | Wt 221.0 lb

## 2014-05-18 DIAGNOSIS — I255 Ischemic cardiomyopathy: Secondary | ICD-10-CM

## 2014-05-18 MED ORDER — CARVEDILOL 3.125 MG PO TABS: ORAL_TABLET | ORAL | Status: DC

## 2014-05-18 NOTE — Progress Notes (Signed)
Patient Care Team: [name], PA-C as PCP - General (Physician Assistant)   HPI  [name] [name] is a 63 y.o. male Seen in follow-up for reconsideration of ICD implantation with rate on the left anterior fascicular block and a duration of greater than 150 ms also to consider CRT. We saw him initially 5/15 He is s/p CABG x 5 (04/2013: LIMA to LAD, SVG to second diagonal, SVG to ramus intermediate and obtuse marginal 2, SVG to posterior descending  ECHO 06/26/13 EF 20-25% RV mild HK ECHO 10/10/13 EF 20-25% restrictive filling pattern. Moderate RV dysfunction. Mild MR.  Initial evaluation was deferred because of comorbidities including a concern related to a loculated pleural effusion for which no further workup is anticipated and no malignancy was found.  he's had some symptomatic hypotension. He continues to smoke  Past Medical History  Diagnosis Date  . COPD (chronic obstructive pulmonary disease)   . Gout   . Emphysema   . Hypertension   . Hyperlipidemia   . Tobacco abuse     30+ pack-year history  . Diabetes mellitus without complication   . Chronic systolic CHF (congestive heart failure)     a. 04/2013 Echo: EF 15-20%, diff HK, sev HK of inf and ant myocardium, dilated LA,  b. EF 20-25% with restrictive filling pattern, mod RV dysfx, mild MR (10/10/2013)  . CAD (coronary artery disease)     a. 04/2013 Cath: LM 10, LAD 40p, 85m, D1 50p, LCX 60p, 18m/100m, OM1 50, OM2 100 L-L collats, RCA 100p;  b. CABG x 5: LIMA->LAD, VG->D2, VG->RI->OM2, VG->PDA.  . Transaminitis     a. 04/2013 Abd U/S and CT unremarkable, hepatitis panel neg -->felt to be 2/2 acute R heart failure.  . Ischemic cardiomyopathy   . PVD (peripheral vascular disease)     a. (10/2013) ABIs: RIGHT 0.63, Waveforms: monophasic;  LEFT 0.78, Waveforms: monophasic    Past Surgical History  Procedure Laterality Date  . Appendectomy  as a child  . Multiple extractions with alveoloplasty N/A 05/03/2013   Procedure: Extraction of tooth #s 3,4,5,6,8,9,27 with alveoloplast and maxillary left osseous tuberosity reduction;  Surgeon: Lenn Cal, DDS;  Location: [facility];  Service: Oral Surgery;  Laterality: N/A;  . Coronary artery bypass graft N/A 05/05/2013    Procedure: CORONARY ARTERY BYPASS GRAFTING (CABG) TIMES FIVE USING LEFT INTERNAL MAMMARY ARTERY AND RIGHT AND LEFT SAPHENOUS LEG VEIN HARVESTED ENDOSCOPICALLY;  Surgeon: Melrose Nakayama, MD;  Location: [facility];  Service: Open Heart Surgery;  Laterality: N/A;    Current Outpatient Prescriptions  Medication Sig Dispense Refill  . ACCU-CHEK AVIVA PLUS test strip See admin instructions. for testing  12  . albuterol (PROVENTIL HFA;VENTOLIN HFA) 108 (90 BASE) MCG/ACT inhaler Inhale 2 puffs into the lungs every 4 (four) hours as needed. For shortness of breath     . allopurinol (ZYLOPRIM) 300 MG tablet Take 300 mg by mouth daily.    Marland Kitchen aspirin 81 MG tablet Take 1 tablet (81 mg total) by mouth daily. 30 tablet 11  . atorvastatin (LIPITOR) 20 MG tablet Take 20 mg by mouth daily.    Marland Kitchen b complex vitamins tablet Take 1 tablet by mouth daily.    . carvedilol (COREG) 3.125 MG tablet Take 3 tablets (9.375 mg total) by mouth 2 (two) times daily with a meal. 120 tablet 3  . colchicine 0.6 MG tablet Take 0.6-1.2 mg by mouth daily. Take 2 tablets at the onset of gout  symptoms, then take 1 tablet in an hour for a max of 3 tablets per 24 hours    . furosemide (LASIX) 40 MG tablet Take 1.5 tablets (60 mg total) by mouth 2 (two) times daily. 90 tablet 3  . gabapentin (NEURONTIN) 300 MG capsule Take 300 mg by mouth 2 (two) times daily.    Marland Kitchen KLOR-CON M20 20 MEQ tablet TAKE 1 TABLET (20 MEQ TOTAL) BY MOUTH DAILY. 30 tablet 5  . lisinopril (PRINIVIL,ZESTRIL) 5 MG tablet Take 1 tablet (5 mg total) by mouth 2 (two) times daily. 60 tablet 3  . lubiprostone (AMITIZA) 24 MCG capsule Take 24 mcg by mouth 2 (two) times daily with a meal.    . nitroGLYCERIN (NITROSTAT) 0.4  MG SL tablet Place 0.4 mg under the tongue.    Marland Kitchen Olopatadine HCl (PATADAY) 0.2 % SOLN Apply 1 drop to eye.    Marland Kitchen omeprazole (PRILOSEC) 20 MG capsule Take 20 mg by mouth daily.    . ondansetron (ZOFRAN) 4 MG tablet Take 4 mg by mouth every 8 (eight) hours as needed for nausea.     Marland Kitchen oxycodone (OXY-IR) 5 MG capsule Take 5 mg by mouth every 4 (four) hours as needed.    Marland Kitchen spironolactone (ALDACTONE) 25 MG tablet Take 1 tablet (25 mg total) by mouth daily. 30 tablet 3  . tiotropium (SPIRIVA) 18 MCG inhalation capsule Place 18 mcg into inhaler and inhale daily.    . traMADol (ULTRAM) 50 MG tablet Take 50 mg by mouth every 6 (six) hours as needed.     No current facility-administered medications for this visit.    Allergies  Allergen Reactions  . Penicillins Other (See Comments)    Swelling , long time ago.     Review of Systems negative except from HPI and PMH  Physical Exam BP 80/58 mmHg  Pulse 83  Ht 5\' 7"  (1.702 m)  Wt 221 lb (100.245 kg)  BMI 34.61 kg/m2 Well developed and well nourished in no acute distress HENT normal E scleral and icterus clear Neck Supple JVP flat; carotids brisk and full Clear to ausculation lungs sounds are diminished Distant heart sounds Regular rate and rhythm, no murmurs gallops or rub Soft with active bowel sounds No clubbing cyanosis  Edema Alert and oriented, grossly normal motor and sensory function Skin Warm and Dry  ECG demonstrates sinus rhythm with right bundle left anterior fascicular block QRS duration is 150 ms.  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  He has been advised to stop smoking. I think his emphysema is  contributing to his dyspnea  He has some  symptomatic hypotension. we will decrease his carvedilol minimally from 9.375--6.25 during the day to try to attenuate this. He is advised to let us know if there were problems with lightheadedness.  Have reviewed the potential benefits and risks of ICD implantation including but not limited to death, perforation of heart or lung, lead dislodgement, infection,  device malfunction and inappropriate shocks.  The patient and family express understanding  and are willing to proceed.     Risks are enhanced with his COPD

## 2014-05-18 NOTE — Patient Instructions (Signed)
CHANGE CARVEDILOL TO 3.125 MG TAKE 2 TABLETS IN THE MORNING AND 3 TABLETS IN THE EVENING  Your physician has requested that you have an echocardiogram. Echocardiography is a painless test that uses sound waves to create images of your heart. It provides your doctor with information about the size and shape of your heart and how well your heart's chambers and valves are working. This procedure takes approximately one hour. There are no restrictions for this procedure.   CRT-D POTENTIAL DATE=

## 2014-05-22 ENCOUNTER — Telehealth (HOSPITAL_COMMUNITY): Payer: Self-pay | Admitting: *Deleted

## 2014-05-22 DIAGNOSIS — I4891 Unspecified atrial fibrillation: Secondary | ICD-10-CM

## 2014-05-22 DIAGNOSIS — I5022 Chronic systolic (congestive) heart failure: Secondary | ICD-10-CM

## 2014-05-22 MED ORDER — LISINOPRIL 5 MG PO TABS: 5.0000 mg | ORAL_TABLET | Freq: Every day | ORAL | Status: DC

## 2014-05-22 NOTE — Telephone Encounter (Signed)
Pt aware, labs sch 12/8

## 2014-05-22 NOTE — Telephone Encounter (Signed)
-----   Message from Larey Dresser, MD sent at 05/20/2014  9:28 PM EST ----- Back off on lisinopril to 5 mg once daily, repeat BMET in 10 days.

## 2014-05-24 ENCOUNTER — Institutional Professional Consult (permissible substitution): Payer: Self-pay | Admitting: Internal Medicine

## 2014-05-24 ENCOUNTER — Ambulatory Visit (HOSPITAL_COMMUNITY): Payer: Medicaid Other | Attending: Internal Medicine

## 2014-05-24 DIAGNOSIS — Z720 Tobacco use: Secondary | ICD-10-CM | POA: Diagnosis not present

## 2014-05-24 DIAGNOSIS — I255 Ischemic cardiomyopathy: Secondary | ICD-10-CM | POA: Diagnosis present

## 2014-05-24 DIAGNOSIS — I10 Essential (primary) hypertension: Secondary | ICD-10-CM | POA: Diagnosis not present

## 2014-05-24 NOTE — Progress Notes (Signed)
2D Echo completed. 05/24/2014 

## 2014-05-29 ENCOUNTER — Encounter (HOSPITAL_COMMUNITY): Payer: Self-pay | Admitting: Cardiology

## 2014-05-29 ENCOUNTER — Ambulatory Visit (HOSPITAL_COMMUNITY)
Admission: RE | Admit: 2014-05-29 | Discharge: 2014-05-29 | Disposition: A | Discharge status: Home / Self Care | Payer: Medicaid Other | Source: Ambulatory Visit | Attending: Cardiology | Admitting: Cardiology

## 2014-05-29 DIAGNOSIS — I5022 Chronic systolic (congestive) heart failure: Secondary | ICD-10-CM | POA: Insufficient documentation

## 2014-05-29 LAB — BASIC METABOLIC PANEL
ANION GAP: 14 (ref 5–15)
BUN: 40 mg/dL — ABNORMAL HIGH (ref 6–23)
CO2: 23 meq/L (ref 19–32)
CREATININE: 1.46 mg/dL — AB (ref 0.50–1.35)
Calcium: 9.9 mg/dL (ref 8.4–10.5)
Chloride: 97 mEq/L (ref 96–112)
GFR calc Af Amer: 58 mL/min — ABNORMAL LOW (ref >90)
GFR calc non Af Amer: 50 mL/min — ABNORMAL LOW (ref >90)
GLUCOSE: 96 mg/dL (ref 70–99)
Potassium: 4.9 mEq/L (ref 3.7–5.3)
SODIUM: 134 meq/L — AB (ref 137–147)

## 2014-05-31 ENCOUNTER — Encounter (HOSPITAL_COMMUNITY): Payer: Self-pay | Admitting: Cardiovascular Disease

## 2014-06-07 ENCOUNTER — Telehealth: Payer: Self-pay | Admitting: *Deleted

## 2014-06-07 NOTE — Telephone Encounter (Signed)
Called patient to schedule ICD implant. Patient given some dates in January and he will discuss with his sister which is the best day (she is bringing him). I will call him tomorrow to get decided date.

## 2014-06-08 NOTE — Telephone Encounter (Signed)
No answer. No voicemail. 

## 2014-06-11 ENCOUNTER — Telehealth: Payer: Self-pay | Admitting: Internal Medicine

## 2014-06-11 DIAGNOSIS — I5022 Chronic systolic (congestive) heart failure: Secondary | ICD-10-CM

## 2014-06-11 DIAGNOSIS — Z01812 Encounter for preprocedural laboratory examination: Secondary | ICD-10-CM

## 2014-06-11 NOTE — Telephone Encounter (Signed)
New message       07-06-14 will be a good day to have his device put in.  He is supposed to call the nurse back with a date.

## 2014-06-13 ENCOUNTER — Encounter: Payer: Self-pay | Admitting: *Deleted

## 2014-06-13 NOTE — Telephone Encounter (Signed)
Scheduled ICD implant for 07/06/14. Pre procedure labs 06/29/14. Wound check 07/16/14. Reviewed letter of instructions with patient and mailed to home address per request. Patient verbalized understanding and agreeable to plan.

## 2014-06-26 ENCOUNTER — Other Ambulatory Visit (INDEPENDENT_AMBULATORY_CARE_PROVIDER_SITE_OTHER): Payer: Medicaid Other | Admitting: *Deleted

## 2014-06-26 DIAGNOSIS — I5022 Chronic systolic (congestive) heart failure: Secondary | ICD-10-CM

## 2014-06-26 DIAGNOSIS — Z01812 Encounter for preprocedural laboratory examination: Secondary | ICD-10-CM

## 2014-06-26 LAB — CBC WITH DIFFERENTIAL/PLATELET
Basophils Absolute: 0 10*3/uL (ref 0.0–0.1)
Basophils Relative: 0.5 % (ref 0.0–3.0)
EOS ABS: 0.2 10*3/uL (ref 0.0–0.7)
Eosinophils Relative: 2.2 % (ref 0.0–5.0)
HEMATOCRIT: 40.7 % (ref 39.0–52.0)
HEMOGLOBIN: 13.1 g/dL (ref 13.0–17.0)
Lymphocytes Relative: 27.3 % (ref 12.0–46.0)
Lymphs Abs: 2.5 10*3/uL (ref 0.7–4.0)
MCHC: 32.2 g/dL (ref 30.0–36.0)
MCV: 91.1 fl (ref 78.0–100.0)
MONOS PCT: 8.6 % (ref 3.0–12.0)
Monocytes Absolute: 0.8 10*3/uL (ref 0.1–1.0)
NEUTROS ABS: 5.5 10*3/uL (ref 1.4–7.7)
Neutrophils Relative %: 61.4 % (ref 43.0–77.0)
Platelets: 223 10*3/uL (ref 150.0–400.0)
RBC: 4.47 Mil/uL (ref 4.22–5.81)
RDW: 16.8 % — AB (ref 11.5–15.5)
WBC: 9 10*3/uL (ref 4.0–10.5)

## 2014-06-26 LAB — BASIC METABOLIC PANEL
BUN: 45 mg/dL — ABNORMAL HIGH (ref 6–23)
CO2: 25 mEq/L (ref 19–32)
Calcium: 9.5 mg/dL (ref 8.4–10.5)
Chloride: 102 mEq/L (ref 96–112)
Creatinine, Ser: 1.6 mg/dL — ABNORMAL HIGH (ref 0.4–1.5)
GFR: 54.86 mL/min — ABNORMAL LOW (ref >60.00)
GLUCOSE: 98 mg/dL (ref 70–99)
Potassium: 4.7 mEq/L (ref 3.5–5.1)
SODIUM: 134 meq/L — AB (ref 135–145)

## 2014-07-06 ENCOUNTER — Encounter (HOSPITAL_COMMUNITY)
Admission: RE | Disposition: A | Discharge status: Home / Self Care | Payer: Self-pay | Source: Ambulatory Visit | Attending: Internal Medicine

## 2014-07-06 ENCOUNTER — Ambulatory Visit (HOSPITAL_COMMUNITY)
Admission: RE | Admit: 2014-07-06 | Discharge: 2014-07-07 | Disposition: A | Discharge status: Home / Self Care | Payer: Medicaid Other | Source: Ambulatory Visit | Attending: Internal Medicine | Admitting: Internal Medicine

## 2014-07-06 ENCOUNTER — Encounter (HOSPITAL_COMMUNITY): Payer: Self-pay | Admitting: Internal Medicine

## 2014-07-06 DIAGNOSIS — I255 Ischemic cardiomyopathy: Secondary | ICD-10-CM | POA: Diagnosis present

## 2014-07-06 DIAGNOSIS — I5022 Chronic systolic (congestive) heart failure: Secondary | ICD-10-CM | POA: Diagnosis not present

## 2014-07-06 DIAGNOSIS — I959 Hypotension, unspecified: Secondary | ICD-10-CM | POA: Diagnosis not present

## 2014-07-06 DIAGNOSIS — I739 Peripheral vascular disease, unspecified: Secondary | ICD-10-CM | POA: Insufficient documentation

## 2014-07-06 DIAGNOSIS — I10 Essential (primary) hypertension: Secondary | ICD-10-CM | POA: Insufficient documentation

## 2014-07-06 DIAGNOSIS — I429 Cardiomyopathy, unspecified: Secondary | ICD-10-CM

## 2014-07-06 DIAGNOSIS — E785 Hyperlipidemia, unspecified: Secondary | ICD-10-CM | POA: Diagnosis not present

## 2014-07-06 DIAGNOSIS — I452 Bifascicular block: Secondary | ICD-10-CM | POA: Insufficient documentation

## 2014-07-06 DIAGNOSIS — F17210 Nicotine dependence, cigarettes, uncomplicated: Secondary | ICD-10-CM | POA: Diagnosis not present

## 2014-07-06 DIAGNOSIS — Z951 Presence of aortocoronary bypass graft: Secondary | ICD-10-CM | POA: Insufficient documentation

## 2014-07-06 DIAGNOSIS — Z959 Presence of cardiac and vascular implant and graft, unspecified: Secondary | ICD-10-CM

## 2014-07-06 DIAGNOSIS — E119 Type 2 diabetes mellitus without complications: Secondary | ICD-10-CM | POA: Insufficient documentation

## 2014-07-06 DIAGNOSIS — J449 Chronic obstructive pulmonary disease, unspecified: Secondary | ICD-10-CM | POA: Diagnosis not present

## 2014-07-06 HISTORY — PX: IMPLANTABLE CARDIOVERTER DEFIBRILLATOR IMPLANT: SHX5473

## 2014-07-06 LAB — GLUCOSE, CAPILLARY: Glucose-Capillary: 76 mg/dL (ref 70–99)

## 2014-07-06 SURGERY — IMPLANTABLE CARDIOVERTER DEFIBRILLATOR IMPLANT
Anesthesia: LOCAL

## 2014-07-06 SURGERY — Surgical Case
Anesthesia: *Unknown

## 2014-07-06 MED ORDER — SODIUM CHLORIDE 0.9 % IV SOLN
INTRAVENOUS | Status: DC
  Administered 2014-07-06: 16:00:00 via INTRAVENOUS

## 2014-07-06 MED ORDER — POTASSIUM CHLORIDE CRYS ER 20 MEQ PO TBCR
20.0000 meq | EXTENDED_RELEASE_TABLET | Freq: Every day | ORAL | Status: DC
  Administered 2014-07-06 – 2014-07-07 (×2): 20 meq via ORAL
  Filled 2014-07-06: qty 1
  Filled 2014-07-06: qty 2

## 2014-07-06 MED ORDER — SPIRONOLACTONE 25 MG PO TABS
25.0000 mg | ORAL_TABLET | Freq: Every day | ORAL | Status: DC
  Administered 2014-07-06 – 2014-07-07 (×2): 25 mg via ORAL
  Filled 2014-07-06 (×2): qty 1

## 2014-07-06 MED ORDER — HEPARIN (PORCINE) IN NACL 2-0.9 UNIT/ML-% IJ SOLN
INTRAMUSCULAR | Status: AC
  Filled 2014-07-06: qty 500

## 2014-07-06 MED ORDER — FENTANYL CITRATE 0.05 MG/ML IJ SOLN
INTRAMUSCULAR | Status: AC
  Filled 2014-07-06: qty 2

## 2014-07-06 MED ORDER — SODIUM CHLORIDE 0.9 % IV SOLN
INTRAVENOUS | Status: AC
  Administered 2014-07-06: 19:00:00 via INTRAVENOUS

## 2014-07-06 MED ORDER — ALBUTEROL SULFATE (2.5 MG/3ML) 0.083% IN NEBU: 2.5000 mg | INHALATION_SOLUTION | Freq: Four times a day (QID) | RESPIRATORY_TRACT | Status: DC | PRN

## 2014-07-06 MED ORDER — SODIUM CHLORIDE 0.9 % IR SOLN
80.0000 mg | Status: DC
  Filled 2014-07-06: qty 2

## 2014-07-06 MED ORDER — TRAMADOL HCL 50 MG PO TABS
50.0000 mg | ORAL_TABLET | Freq: Four times a day (QID) | ORAL | Status: DC | PRN
  Administered 2014-07-07: 50 mg via ORAL
  Filled 2014-07-06: qty 1

## 2014-07-06 MED ORDER — CARVEDILOL 6.25 MG PO TABS
9.3750 mg | ORAL_TABLET | Freq: Every day | ORAL | Status: DC
  Administered 2014-07-06: 9.375 mg via ORAL
  Filled 2014-07-06 (×2): qty 1

## 2014-07-06 MED ORDER — SODIUM CHLORIDE 0.9 % IV SOLN
INTRAVENOUS | Status: DC
Start: 2014-07-06 — End: 2014-07-06
  Administered 2014-07-06: 15:00:00 via INTRAVENOUS

## 2014-07-06 MED ORDER — CARVEDILOL 6.25 MG PO TABS
6.2500 mg | ORAL_TABLET | Freq: Every day | ORAL | Status: DC
  Administered 2014-07-07: 6.25 mg via ORAL
  Filled 2014-07-06 (×2): qty 1

## 2014-07-06 MED ORDER — FUROSEMIDE 40 MG PO TABS
60.0000 mg | ORAL_TABLET | Freq: Two times a day (BID) | ORAL | Status: DC
  Administered 2014-07-06 – 2014-07-07 (×2): 60 mg via ORAL
  Filled 2014-07-06 (×4): qty 1

## 2014-07-06 MED ORDER — LISINOPRIL 5 MG PO TABS
5.0000 mg | ORAL_TABLET | Freq: Every day | ORAL | Status: DC
  Administered 2014-07-06 – 2014-07-07 (×2): 5 mg via ORAL
  Filled 2014-07-06 (×2): qty 1

## 2014-07-06 MED ORDER — CHLORHEXIDINE GLUCONATE 4 % EX LIQD
60.0000 mL | Freq: Once | CUTANEOUS | Status: DC
Start: 2014-07-06 — End: 2014-07-06
  Filled 2014-07-06: qty 60

## 2014-07-06 MED ORDER — ATORVASTATIN CALCIUM 20 MG PO TABS
20.0000 mg | ORAL_TABLET | Freq: Every day | ORAL | Status: DC
  Administered 2014-07-06 – 2014-07-07 (×2): 20 mg via ORAL
  Filled 2014-07-06 (×2): qty 1

## 2014-07-06 MED ORDER — MUPIROCIN 2 % EX OINT
TOPICAL_OINTMENT | CUTANEOUS | Status: AC
  Filled 2014-07-06: qty 22

## 2014-07-06 MED ORDER — ASPIRIN EC 81 MG PO TBEC
81.0000 mg | DELAYED_RELEASE_TABLET | Freq: Every day | ORAL | Status: DC
  Administered 2014-07-06 – 2014-07-07 (×2): 81 mg via ORAL
  Filled 2014-07-06 (×2): qty 1

## 2014-07-06 MED ORDER — VANCOMYCIN HCL 10 G IV SOLR
1500.0000 mg | INTRAVENOUS | Status: DC
  Filled 2014-07-06: qty 1500

## 2014-07-06 MED ORDER — OXYCODONE HCL 5 MG PO TABS
5.0000 mg | ORAL_TABLET | ORAL | Status: DC | PRN
  Administered 2014-07-06: 5 mg via ORAL
  Filled 2014-07-06 (×2): qty 1

## 2014-07-06 MED ORDER — CARVEDILOL 6.25 MG PO TABS: 6.2500 mg | ORAL_TABLET | Freq: Two times a day (BID) | ORAL | Status: DC

## 2014-07-06 MED ORDER — LUBIPROSTONE 24 MCG PO CAPS
24.0000 ug | ORAL_CAPSULE | Freq: Two times a day (BID) | ORAL | Status: DC
  Administered 2014-07-07: 24 ug via ORAL
  Filled 2014-07-06 (×4): qty 1

## 2014-07-06 MED ORDER — GABAPENTIN 300 MG PO CAPS
300.0000 mg | ORAL_CAPSULE | Freq: Two times a day (BID) | ORAL | Status: DC
  Administered 2014-07-06 – 2014-07-07 (×2): 300 mg via ORAL
  Filled 2014-07-06 (×2): qty 1

## 2014-07-06 MED ORDER — ONDANSETRON HCL 4 MG/2ML IJ SOLN: 4.0000 mg | Freq: Four times a day (QID) | INTRAMUSCULAR | Status: DC | PRN

## 2014-07-06 MED ORDER — COLCHICINE 0.6 MG PO TABS
0.6000 mg | ORAL_TABLET | Freq: Every day | ORAL | Status: DC
  Administered 2014-07-06 – 2014-07-07 (×2): 0.6 mg via ORAL
  Filled 2014-07-06 (×2): qty 1

## 2014-07-06 MED ORDER — PANTOPRAZOLE SODIUM 40 MG PO TBEC
40.0000 mg | DELAYED_RELEASE_TABLET | Freq: Every day | ORAL | Status: DC
  Administered 2014-07-06 – 2014-07-07 (×2): 40 mg via ORAL
  Filled 2014-07-06 (×2): qty 1

## 2014-07-06 MED ORDER — MIDAZOLAM HCL 5 MG/5ML IJ SOLN
INTRAMUSCULAR | Status: AC
  Filled 2014-07-06: qty 5

## 2014-07-06 MED ORDER — LIDOCAINE HCL (PF) 1 % IJ SOLN
INTRAMUSCULAR | Status: AC
  Filled 2014-07-06: qty 60

## 2014-07-06 MED ORDER — ALBUTEROL SULFATE (2.5 MG/3ML) 0.083% IN NEBU: 2.5000 mg | INHALATION_SOLUTION | RESPIRATORY_TRACT | Status: DC | PRN

## 2014-07-06 MED ORDER — ACETAMINOPHEN 325 MG PO TABS: 325.0000 mg | ORAL_TABLET | ORAL | Status: DC | PRN

## 2014-07-06 MED ORDER — ONDANSETRON HCL 4 MG PO TABS: 4.0000 mg | ORAL_TABLET | Freq: Three times a day (TID) | ORAL | Status: DC | PRN

## 2014-07-06 MED ORDER — ALLOPURINOL 300 MG PO TABS
300.0000 mg | ORAL_TABLET | Freq: Every day | ORAL | Status: DC
  Administered 2014-07-06 – 2014-07-07 (×2): 300 mg via ORAL
  Filled 2014-07-06 (×2): qty 1

## 2014-07-06 MED ORDER — TIOTROPIUM BROMIDE MONOHYDRATE 18 MCG IN CAPS
18.0000 ug | ORAL_CAPSULE | Freq: Every day | RESPIRATORY_TRACT | Status: DC
  Filled 2014-07-06: qty 5

## 2014-07-06 MED ORDER — MUPIROCIN 2 % EX OINT
1.0000 "application " | TOPICAL_OINTMENT | Freq: Once | CUTANEOUS | Status: AC
  Administered 2014-07-06: 1 via TOPICAL

## 2014-07-06 MED ORDER — NITROGLYCERIN 0.4 MG SL SUBL: 0.4000 mg | SUBLINGUAL_TABLET | SUBLINGUAL | Status: DC | PRN

## 2014-07-06 NOTE — CV Procedure (Signed)
Tank Difiore [id number]  [id number]  Preop Dx: cardiomyopathy RBBBLAFB Postop Dx same/    Procedure: single chamber ICD implantation with out intraoperative defibrillation threshold testing  Following the obtaining of informed consent the patient was brought to the electrophysiology laboratory in place of the fluoroscopic table in the supine position.  After routine prep and drape, lidocaine was infiltrated in the prepectoral subclavicular region and an incision was made and carried down to the layer of the prepectoral fascia using electrocautery and sharp dissection. A pocket was formed similarly.  Thereafter an attention was turned to gain access to the extrathoracic left subclavian vein which was accomplished without difficulty and without the aspiration of air or puncture of the artery. A 9 French sheath was placed for which was then passed a  Single coil  active fixation defibrillator lead, model St Jude [id number] serial number [id number]. It was  passed under fluoroscopic guidance to the right ventricular apex. In its location the bipolar R wave was 8 millivolts, impedance was 616+ ohms, the pacing threshold was 0.4 volts at 0.5 msec. Current at threshold was 0.7 mA.  There was no diaphragmatic pacing at 10 V. The current of injury was brisk .  The lead was secured to the prepectoral fascia and then attached to a [place], serial number  [id number].  Through the device, the bipolar R wave was 6 millivolts, impedance was 540  ohms, the pacing threshold was 0.5 volts at 0.5 msec. High-voltage impedance was 73 ohms.  The pocket was copiously irrigated with antibiotic containing saline solution. Hemostasis was assured, and the device and the lead was placed in the pocket and secured to the prepectoral fascia.  The wound was closed in 2 layers in normal fashion. The wound was washed dried and a DERMABOND   was then applied. Needle counts sponge counts and instrument counts were correct at the end of  the procedure according to the staff.   EBL  Minimal     Cx: None     [name], MD 07/06/2014 5:00 PM

## 2014-07-06 NOTE — H&P (Signed)
Patient Care Team: [name], PA-C as PCP - General (Physician Assistant)   HPI  [name] [name] is a 64 y.o. male Admitted for ICD implantation.   He is s/p CABG x 5 (04/2013: LIMA to LAD, SVG to second diagonal, SVG to ramus intermediate and obtuse marginal 2, SVG to posterior descending  ECHO 06/26/13 EF 20-25% RV mild HK ECHO 10/10/13 EF 20-25% restrictive filling pattern. Moderate RV dysfunction. Mild MR.  He has RBBB LAFB with borderline QRS d @< 140 msec; hence we have elected not to pursue CRT  he's had some symptomatic hypotension. He continues to smoke  The patient denies chest pain, shortness of breath, nocturnal dyspnea, orthopnea or peripheral edema.  There have been no palpitations, lightheadedness or syncope.    Past Medical History  Diagnosis Date  . COPD (chronic obstructive pulmonary disease)   . Gout   . Emphysema   . Hypertension   . Hyperlipidemia   . Tobacco abuse     30+ pack-year history  . Diabetes mellitus without complication   . Chronic systolic CHF (congestive heart failure)     a. 04/2013 Echo: EF 15-20%, diff HK, sev HK of inf and ant myocardium, dilated LA,  b. EF 20-25% with restrictive filling pattern, mod RV dysfx, mild MR (10/10/2013)  . CAD (coronary artery disease)     a. 04/2013 Cath: LM 10, LAD 40p, 79m, D1 50p, LCX 60p, 21m/100m, OM1 50, OM2 100 L-L collats, RCA 100p;  b. CABG x 5: LIMA->LAD, VG->D2, VG->RI->OM2, VG->PDA.  . Transaminitis     a. 04/2013 Abd U/S and CT unremarkable, hepatitis panel neg -->felt to be 2/2 acute R heart failure.  . Ischemic cardiomyopathy   . PVD (peripheral vascular disease)     a. (10/2013) ABIs: RIGHT 0.63, Waveforms: monophasic;  LEFT 0.78, Waveforms: monophasic    Past Surgical History  Procedure Laterality Date  . Appendectomy  as a child  . Multiple extractions with alveoloplasty N/A 05/03/2013    Procedure: Extraction of tooth #s 3,4,5,6,8,9,27 with alveoloplast and maxillary left  osseous tuberosity reduction;  Surgeon: Lenn Cal, DDS;  Location: [facility];  Service: Oral Surgery;  Laterality: N/A;  . Coronary artery bypass graft N/A 05/05/2013    Procedure: CORONARY ARTERY BYPASS GRAFTING (CABG) TIMES FIVE USING LEFT INTERNAL MAMMARY ARTERY AND RIGHT AND LEFT SAPHENOUS LEG VEIN HARVESTED ENDOSCOPICALLY;  Surgeon: Melrose Nakayama, MD;  Location: [facility];  Service: Open Heart Surgery;  Laterality: N/A;  . Left and right heart catheterization with coronary angiogram N/A 04/28/2013    Procedure: LEFT AND RIGHT HEART CATHETERIZATION WITH CORONARY ANGIOGRAM;  Surgeon: Burnell Blanks, MD;  Location: [facility] CATH LAB;  Service: Cardiovascular;  Laterality: N/A;      Medication List    ASK your doctor about these medications        ACCU-CHEK AVIVA PLUS test strip  Generic drug:  glucose blood  See admin instructions. for testing     albuterol 108 (90 BASE) MCG/ACT inhaler  Commonly known as:  PROVENTIL HFA;VENTOLIN HFA  Inhale 2 puffs into the lungs every 4 (four) hours as needed. For shortness of breath     albuterol (2.5 MG/3ML) 0.083% nebulizer solution  Commonly known as:  PROVENTIL  Take 2.5 mg by nebulization every 6 (six) hours as needed for wheezing or shortness of breath.     allopurinol 300 MG tablet  Commonly known as:  ZYLOPRIM  Take 300 mg by  mouth daily.     aspirin EC 81 MG tablet  Take 1 tablet (81 mg total) by mouth daily.     atorvastatin 20 MG tablet  Commonly known as:  LIPITOR  Take 20 mg by mouth daily.     b complex vitamins tablet  Take 1 tablet by mouth daily.     carvedilol 3.125 MG tablet  Commonly known as:  COREG  Take 6.25-9.375 mg by mouth 2 (two) times daily with a meal. Takes 2 tablets in am and 3 tablets in pm     carvedilol 3.125 MG tablet  Commonly known as:  COREG  TAKE 2 TABLETS IN THE MORNING AND 3 TABLETS IN THE EVENING     colchicine 0.6 MG tablet  Take 0.6-1.2 mg by mouth daily. Take 2 tablets at the  onset of gout symptoms, then take 1 tablet in an hour for a max of 3 tablets per 24 hours     furosemide 40 MG tablet  Commonly known as:  LASIX  Take 1.5 tablets (60 mg total) by mouth 2 (two) times daily.     gabapentin 300 MG capsule  Commonly known as:  NEURONTIN  Take 300 mg by mouth 2 (two) times daily.     KLOR-CON M20 20 MEQ tablet  Generic drug:  potassium chloride SA  TAKE 1 TABLET (20 MEQ TOTAL) BY MOUTH DAILY.     potassium chloride SA 20 MEQ tablet  Commonly known as:  K-DUR,KLOR-CON  Take 20 mEq by mouth daily.     lisinopril 5 MG tablet  Commonly known as:  PRINIVIL,ZESTRIL  Take 1 tablet (5 mg total) by mouth daily.     lubiprostone 24 MCG capsule  Commonly known as:  AMITIZA  Take 24 mcg by mouth 2 (two) times daily with a meal.     NITROSTAT 0.4 MG SL tablet  Generic drug:  nitroGLYCERIN  Place 0.4 mg under the tongue every 5 (five) minutes as needed for chest pain.     omeprazole 20 MG capsule  Commonly known as:  PRILOSEC  Take 20 mg by mouth daily.     ondansetron 4 MG tablet  Commonly known as:  ZOFRAN  Take 4 mg by mouth every 8 (eight) hours as needed for nausea.     oxycodone 5 MG capsule  Commonly known as:  OXY-IR  Take 5 mg by mouth every 4 (four) hours as needed for pain.     PATADAY 0.2 % Soln  Generic drug:  Olopatadine HCl  Apply 1 drop to eye daily.     spironolactone 25 MG tablet  Commonly known as:  ALDACTONE  Take 1 tablet (25 mg total) by mouth daily.     tiotropium 18 MCG inhalation capsule  Commonly known as:  SPIRIVA  Place 18 mcg into inhaler and inhale daily.     traMADol 50 MG tablet  Commonly known as:  ULTRAM  Take 50 mg by mouth every 6 (six) hours as needed for moderate pain.         Allergies  Allergen Reactions  . Penicillins Other (See Comments)    Swelling , long time ago.     Review of Systems negative except from HPI and PMH  Physical Exam BP 119/75 mmHg  Pulse 86  Temp(Src) 97.8 F (36.6 C)  (Oral)  Resp 16  Ht 5\' 7"  (1.702 m)  Wt 220 lb (99.791 kg)  BMI 34.45 kg/m2  SpO2 98% Well developed and well  nourished in no acute distress HENT normal E scleral and icterus clear Neck Supple JVP flat; carotids brisk and full Clear to ausculation  Regular rate and rhythm, no murmurs gallops or rub Soft with active bowel sounds No clubbing cyanosis  Edema Alert and oriented, grossly normal motor and sensory function Skin Warm and Dry    Assessment and  Plan  ICM  RBB/LAFB  CHF chronic systolic  For ICD implant    Have reviewed the potential benefits and risks of ICD implantation including but not limited to death, perforation of heart or lung, lead dislodgement, infection,  device malfunction and inappropriate shocks.  The patient and family express understanding  and are willing to proceed.

## 2014-07-07 ENCOUNTER — Encounter (HOSPITAL_COMMUNITY): Payer: Self-pay | Admitting: Physician Assistant

## 2014-07-07 ENCOUNTER — Ambulatory Visit (HOSPITAL_COMMUNITY): Payer: Medicaid Other

## 2014-07-07 DIAGNOSIS — F17210 Nicotine dependence, cigarettes, uncomplicated: Secondary | ICD-10-CM | POA: Diagnosis not present

## 2014-07-07 DIAGNOSIS — I959 Hypotension, unspecified: Secondary | ICD-10-CM | POA: Diagnosis not present

## 2014-07-07 DIAGNOSIS — I255 Ischemic cardiomyopathy: Secondary | ICD-10-CM | POA: Diagnosis not present

## 2014-07-07 DIAGNOSIS — Z951 Presence of aortocoronary bypass graft: Secondary | ICD-10-CM | POA: Diagnosis not present

## 2014-07-07 NOTE — Progress Notes (Signed)
       Patient Name: [name] [name]      SUBJECTIVE: stable withlout chest pain or sob  Past Medical History  Diagnosis Date  . COPD (chronic obstructive pulmonary disease)   . Gout   . Emphysema   . Hypertension   . Hyperlipidemia   . Tobacco abuse     30+ pack-year history  . Diabetes mellitus without complication   . Chronic systolic CHF (congestive heart failure)     a. 04/2013 Echo: EF 15-20%, diff HK, sev HK of inf and ant myocardium, dilated LA,  b. EF 20-25% with restrictive filling pattern, mod RV dysfx, mild MR (10/10/2013)  . CAD (coronary artery disease)     a. 04/2013 Cath: LM 10, LAD 40p, 99m, D1 50p, LCX 60p, 40m/100m, OM1 50, OM2 100 L-L collats, RCA 100p;  b. CABG x 5: LIMA->LAD, VG->D2, VG->RI->OM2, VG->PDA.  . Transaminitis     a. 04/2013 Abd U/S and CT unremarkable, hepatitis panel neg -->felt to be 2/2 acute R heart failure.  . Ischemic cardiomyopathy   . PVD (peripheral vascular disease)     a. (10/2013) ABIs: RIGHT 0.63, Waveforms: monophasic;  LEFT 0.78, Waveforms: monophasic    Scheduled Meds:  Scheduled Meds: . allopurinol  300 mg Oral Daily  . aspirin  81 mg Oral Daily  . atorvastatin  20 mg Oral Daily  . carvedilol  6.25 mg Oral Q breakfast  . carvedilol  9.375 mg Oral Q supper  . colchicine  0.6 mg Oral Daily  . furosemide  60 mg Oral BID  . gabapentin  300 mg Oral BID  . lisinopril  5 mg Oral Daily  . lubiprostone  24 mcg Oral BID WC  . pantoprazole  40 mg Oral Daily  . potassium chloride SA  20 mEq Oral Daily  . spironolactone  25 mg Oral Daily  . tiotropium  18 mcg Inhalation Daily   Continuous Infusions:  acetaminophen, albuterol, nitroGLYCERIN, ondansetron (ZOFRAN) IV, ondansetron, oxyCODONE, traMADol    PHYSICAL EXAM Filed Vitals:   07/07/14 0400 07/07/14 0410 07/07/14 0510 07/07/14 0610  BP:  164/117 129/50 128/56  Pulse:  95 91 82  Temp: 97.6 F (36.4 C)     TempSrc: Oral     Resp:  21 25 19   Height:      Weight: 223  lb (101.152 kg)     SpO2:  98% 95% 95%    Well developed and nourished in no acute distress HENT normal Neck supple with JVP-flat Clear pocekt without heamtoma Regular rate and rhythm, no murmurs or gallops Abd-soft with active BS No Clubbing cyanosis edema Skin-warm and dry A & Oriented  Grossly normal sensory and motor function   TELEMETRY: Reviewed telemetry pt in *nsr     Intake/Output Summary (Last 24 hours) at 07/07/14 0908 Last data filed at 07/07/14 0842  Gross per 24 hour  Intake    480 ml  Output    950 ml  Net   -470 ml    LABS:  BNP (last 3 results)  Recent Labs  01/23/14 1137 05/01/14 1023  PROBNP 635.6* 938.0*     Device Interrogation: normal device function  ASSESSMENT AND PLAN:  Active Problems:   Ischemic cardiomyopathy  For discharge  Signed, [name] MD  07/07/2014

## 2014-07-07 NOTE — Discharge Instructions (Signed)
° ° °  Supplemental Discharge Instructions for  Defibrillator Patients  Activity No heavy lifting or vigorous activity with your left arm for 6 to 8 weeks.  Do not raise your left arm above your head for one week.  Gradually raise your affected arm as drawn below.          1/23                         1/24                              1/25                     1/26       NO DRIVING for  2 weeks    ; you may begin driving on   8/75/64       . WOUND CARE - Keep the wound area clean and dry.  Do not get this area wet for one week. No showers for one week; you may shower on    1/23          . - The tape/steri-strips on your wound will fall off; do not pull them off.  No bandage is needed on the site.  DO  NOT apply any creams, oils, or ointments to the wound area. - If you notice any drainage or discharge from the wound, any swelling or bruising at the site, or you develop a fever > 101? F after you are discharged home, call the office at once.  Special Instructions - You are still able to use cellular telephones; use the ear opposite the side where you have your pacemaker/defibrillator.  Avoid carrying your cellular phone near your device. - When traveling through airports, show security personnel your identification card to avoid being screened in the metal detectors.  Ask the security personnel to use the hand wand. - Avoid arc welding equipment, MRI testing (magnetic resonance imaging), TENS units (transcutaneous nerve stimulators).  Call the office for questions about other devices. - Avoid electrical appliances that are in poor condition or are not properly grounded. - Microwave ovens are safe to be near or to operate.  Additional information for defibrillator patients should your device go off: - If your device goes off ONCE and you feel fine afterward, notify the device clinic nurses. - If your device goes off ONCE and you do not feel well afterward, call 911. - If your device goes off  TWICE, call 911. - If your device goes off THREE times in one day, call 911.  DO NOT DRIVE YOURSELF OR A FAMILY MEMBER WITH A DEFIBRILLATOR TO THE HOSPITAL--CALL 911.

## 2014-07-07 NOTE — Discharge Summary (Signed)
Discharge Summary   Patient ID: [name] [name], MRN: [id number], DOB/AGE: 1950/10/09 64 y.o.  Admit date: 07/06/2014 Discharge date: 07/07/2014   Primary Care Physician:  [name]   Primary Cardiologist:  Dr. Loralie Champagne ([facility]) Primary Electrophysiologist:  Dr. [name]   Reason for Admission:   ICD Implantation   Primary Discharge Diagnoses:  Active Problems:   Ischemic cardiomyopathy     Wt Readings from Last 3 Encounters:  07/07/14 223 lb (101.152 kg)  05/18/14 221 lb (100.245 kg)  05/01/14 222 lb (100.699 kg)    Secondary Discharge Diagnoses:   Past Medical History  Diagnosis Date  . COPD (chronic obstructive pulmonary disease)   . Gout   . Emphysema   . Hypertension   . Hyperlipidemia   . Tobacco abuse     30+ pack-year history  . Diabetes mellitus without complication   . Chronic systolic CHF (congestive heart failure)     a. 04/2013 Echo: EF 15-20%, diff HK, sev HK of inf and ant myocardium, dilated LA,  b. EF 20-25% with restrictive filling pattern, mod RV dysfx, mild MR (10/10/2013);  c.  Echo 12/15: EF 25-30%  . CAD (coronary artery disease)     a. 04/2013 Cath: LM 10, LAD 40p, 71m, D1 50p, LCX 60p, 60m/100m, OM1 50, OM2 100 L-L collats, RCA 100p;  b. CABG x 5: LIMA->LAD, VG->D2, VG->RI->OM2, VG->PDA.  . Transaminitis     a. 04/2013 Abd U/S and CT unremarkable, hepatitis panel neg -->felt to be 2/2 acute R heart failure.  . Ischemic cardiomyopathy   . PVD (peripheral vascular disease)     a. (10/2013) ABIs: RIGHT 0.63, Waveforms: monophasic;  LEFT 0.78, Waveforms: monophasic      Allergies:    Allergies  Allergen Reactions  . Penicillins Other (See Comments)    Swelling , long time ago.       Procedures Performed This Admission:   Single chamber ICD implantation 07/06/14   Hospital Course:  [name] [name] is a 64 y.o. male with a history of CAD, status post CABG, ischemic cardiomyopathy with an EF of 16-96%, systolic CHF, COPD, DM  2, PAD, HTN, tobacco abuse. He was referred to Dr. Caryl Comes for further consideration of ICD implantation. He was felt to meet criteria.  Follow-up echocardiogram 12/15 demonstrated an EF of 25-30%.  He was brought in by Dr. Caryl Comes on 07/06/14 for the planned procedure. He underwent implantation of a single-chamber AICD. He tolerated the procedure well and endometrial, occasions. He was seen this morning by Dr. Caryl Comes. He was doing well without chest pain or shortness of breath. He was felt stable for discharge to home.  Appropriate follow-up will be obtained with a 10 day wound check and a 3 month follow-up with Dr. Caryl Comes.   Discharge Vitals:   Blood pressure 114/80, pulse 87, temperature 97.6 F (36.4 C), temperature source Oral, resp. rate 18, height 5\' 7"  (1.702 m), weight 223 lb (101.152 kg), SpO2 97 %.   Labs:  No results for input(s): WBC, HGB, HCT, MCV, PLT in the last 72 hours.  No results for input(s): NA, K, CL, CO2, BUN, CREATININE, CALCIUM, PROT, BILITOT, ALKPHOS, ALT, AST in the last 72 hours.  Invalid input(s): LABALBU, GLUUCOSE  No results for input(s): CKTOTAL, CKMB, TROPONINI in the last 72 hours.  Lab Results  Component Value Date   CHOL 98 05/01/2013   HDL 31* 05/01/2013   LDLCALC 53 05/01/2013   TRIG 71 05/01/2013  No results found for: DDIMER  Lab Results  Component Value Date   TSH 0.264* 05/17/2011    No results for input(s): INR in the last 72 hours.   Diagnostic Procedures and Studies:  Dg Chest 2 View  07/07/2014   CLINICAL DATA:  Ischemic cardiomyopathy.  Postop from ICD placement.  EXAM: CHEST  2 VIEW  COMPARISON:  11/02/2013  FINDINGS: Single lead AICD is seen with lead overlying the right ventricle. No evidence of pneumothorax or pleural effusion. Both lungs are clear. Heart size is normal. Prior CABG again noted.  IMPRESSION: AICD in appropriate position. No evidence of pneumothorax or other acute findings.   Electronically Signed   By: Earle Gell  M.D.   On: 07/07/2014 10:36     Disposition:   Pt is being discharged home today in good condition.  Follow-up Plans & Appointments      Follow-up Information    Follow up with [facility] On 07/16/2014.   Why:  12 PM - , For wound re-check   Contact information:   [place] [phone]       Follow up with [name], MD In 3 months.   Specialty:  Cardiology   Why:  The office will call you to arrange a follow-up with Dr. Caryl Comes in 3 months   Contact information:   [address] [place] [address] [phone]       Follow up with Loralie Champagne, MD.   Specialty:  Cardiology   Why:  As planned   Contact information:   [address] [place] [address] [phone]       Discharge Medications    Medication List    TAKE these medications        ACCU-CHEK AVIVA PLUS test strip  Generic drug:  glucose blood  See admin instructions. for testing     albuterol 108 (90 BASE) MCG/ACT inhaler  Commonly known as:  PROVENTIL HFA;VENTOLIN HFA  Inhale 2 puffs into the lungs every 4 (four) hours as needed. For shortness of breath     albuterol (2.5 MG/3ML) 0.083% nebulizer solution  Commonly known as:  PROVENTIL  Take 2.5 mg by nebulization every 6 (six) hours as needed for wheezing or shortness of breath.     allopurinol 300 MG tablet  Commonly known as:  ZYLOPRIM  Take 300 mg by mouth daily.     aspirin EC 81 MG tablet  Take 1 tablet (81 mg total) by mouth daily.     atorvastatin 20 MG tablet  Commonly known as:  LIPITOR  Take 20 mg by mouth daily.     b complex vitamins tablet  Take 1 tablet by mouth daily.     carvedilol 3.125 MG tablet  Commonly known as:  COREG  Take 6.25-9.375 mg by mouth 2 (two) times daily with a meal. Takes 2 tablets in am and 3 tablets in pm     colchicine 0.6 MG tablet  Take 0.6-1.2 mg by mouth daily. Take 2 tablets at the onset of gout  symptoms, then take 1 tablet in an hour for a max of 3 tablets per 24 hours     furosemide 40 MG tablet  Commonly known as:  LASIX  Take 1.5 tablets (60 mg total) by mouth 2 (two) times daily.     gabapentin 300 MG capsule  Commonly known as:  NEURONTIN  Take 300 mg by mouth 2 (  two) times daily.     KLOR-CON M20 20 MEQ tablet  Generic drug:  potassium chloride SA  TAKE 1 TABLET (20 MEQ TOTAL) BY MOUTH DAILY.     potassium chloride SA 20 MEQ tablet  Commonly known as:  K-DUR,KLOR-CON  Take 20 mEq by mouth daily.     lisinopril 5 MG tablet  Commonly known as:  PRINIVIL,ZESTRIL  Take 1 tablet (5 mg total) by mouth daily.     lubiprostone 24 MCG capsule  Commonly known as:  AMITIZA  Take 24 mcg by mouth 2 (two) times daily with a meal.     NITROSTAT 0.4 MG SL tablet  Generic drug:  nitroGLYCERIN  Place 0.4 mg under the tongue every 5 (five) minutes as needed for chest pain.     omeprazole 20 MG capsule  Commonly known as:  PRILOSEC  Take 20 mg by mouth daily.     ondansetron 4 MG tablet  Commonly known as:  ZOFRAN  Take 4 mg by mouth every 8 (eight) hours as needed for nausea.     oxycodone 5 MG capsule  Commonly known as:  OXY-IR  Take 5 mg by mouth every 4 (four) hours as needed for pain.     PATADAY 0.2 % Soln  Generic drug:  Olopatadine HCl  Apply 1 drop to eye daily.     spironolactone 25 MG tablet  Commonly known as:  ALDACTONE  Take 1 tablet (25 mg total) by mouth daily.     tiotropium 18 MCG inhalation capsule  Commonly known as:  SPIRIVA  Place 18 mcg into inhaler and inhale daily.     traMADol 50 MG tablet  Commonly known as:  ULTRAM  Take 50 mg by mouth every 6 (six) hours as needed for moderate pain.         Outstanding Labs/Studies  1. None   Duration of Discharge Encounter: Greater than 30 minutes including physician and PA time.  Signed, Richardson Dopp, PA-C   07/07/2014 4:11 PM

## 2014-07-16 ENCOUNTER — Ambulatory Visit: Payer: Self-pay

## 2014-07-19 ENCOUNTER — Ambulatory Visit: Payer: Self-pay

## 2014-07-19 ENCOUNTER — Encounter: Payer: Self-pay | Admitting: Internal Medicine

## 2014-07-19 NOTE — Progress Notes (Unsigned)
ICD Criteria  Current LVEF:25% ;Obtained > or = 1 month ago and < or = 3 months ago.  NYHA Functional Classification: Class II  Heart Failure History:  Yes, Duration of heart failure since onset is 3 to 9 months  Non-Ischemic Dilated Cardiomyopathy History:  No.  Atrial Fibrillation/Atrial Flutter:  No.  Ventricular Tachycardia History:  No.  Cardiac Arrest History:  No  History of Syndromes with Risk of Sudden Death:  No.  Previous ICD:  No.  Electrophysiology Study: No.  Prior MI: Yes, Most recent MI timeframe is > 40 days.  PPM: No.  OSA:  No  Patient Life Expectancy of >=1 year: Yes.  Anticoagulation Therapy:  Patient is NOT on anticoagulation therapy.   Beta Blocker Therapy:  Yes.   Ace Inhibitor/ARB Therapy:  Yes.

## 2014-07-25 ENCOUNTER — Ambulatory Visit (INDEPENDENT_AMBULATORY_CARE_PROVIDER_SITE_OTHER): Payer: Medicaid Other | Admitting: *Deleted

## 2014-07-25 DIAGNOSIS — I255 Ischemic cardiomyopathy: Secondary | ICD-10-CM | POA: Diagnosis not present

## 2014-07-25 LAB — MDC_IDC_ENUM_SESS_TYPE_INCLINIC
Battery Remaining Longevity: 98.4 mo
Brady Statistic RV Percent Paced: 0 %
Date Time Interrogation Session: 20160203103600
HighPow Impedance: 72 Ohm
Implantable Pulse Generator Serial Number: 7233603
Lead Channel Pacing Threshold Amplitude: 0.75 V
Lead Channel Pacing Threshold Amplitude: 0.75 V
Lead Channel Pacing Threshold Pulse Width: 0.5 ms
Lead Channel Sensing Intrinsic Amplitude: 12 mV
Lead Channel Setting Pacing Pulse Width: 0.5 ms
Lead Channel Setting Sensing Sensitivity: 0.5 mV
MDC IDC MSMT LEADCHNL RV IMPEDANCE VALUE: 525 Ohm
MDC IDC MSMT LEADCHNL RV PACING THRESHOLD PULSEWIDTH: 0.5 ms
MDC IDC SET LEADCHNL RV PACING AMPLITUDE: 3.5 V
MDC IDC SET ZONE DETECTION INTERVAL: 300 ms
Zone Setting Detection Interval: 250 ms

## 2014-07-25 NOTE — Progress Notes (Signed)
Pacemaker check in clinic. Normal device function. Thresholds, sensing, impedances consistent with previous measurements. Device programmed to maximize longevity. No mode switch or high ventricular rates noted. Device programmed at appropriate safety margins. Histogram distribution appropriate for patient activity level. Device programmed to optimize intrinsic conduction. Estimated longevity 11.5 years. Patient enrolled in remote follow-up/TTM's with [employer]. Plan to follow every 3 months remotely and see annually in office. Patient education completed.  [employer] 10/24/14.restrictions, shock plan. ROV in 3 months with implanting physician.

## 2014-08-01 ENCOUNTER — Encounter: Payer: Self-pay | Admitting: Internal Medicine

## 2014-08-27 ENCOUNTER — Other Ambulatory Visit: Payer: Self-pay | Admitting: *Deleted

## 2014-08-27 MED ORDER — POTASSIUM CHLORIDE CRYS ER 20 MEQ PO TBCR: EXTENDED_RELEASE_TABLET | ORAL | Status: DC

## 2014-09-07 ENCOUNTER — Ambulatory Visit (INDEPENDENT_AMBULATORY_CARE_PROVIDER_SITE_OTHER): Payer: Medicaid Other

## 2014-09-07 VITALS — BP 126/80 | HR 78 | Resp 18

## 2014-09-07 DIAGNOSIS — E1140 Type 2 diabetes mellitus with diabetic neuropathy, unspecified: Secondary | ICD-10-CM | POA: Diagnosis not present

## 2014-09-07 DIAGNOSIS — M79676 Pain in unspecified toe(s): Secondary | ICD-10-CM | POA: Diagnosis not present

## 2014-09-07 DIAGNOSIS — R234 Changes in skin texture: Secondary | ICD-10-CM

## 2014-09-07 DIAGNOSIS — B351 Tinea unguium: Secondary | ICD-10-CM | POA: Diagnosis not present

## 2014-09-07 NOTE — Progress Notes (Signed)
   Subjective:    Patient ID: [name] [name], male    DOB: [birth year], 64 y.o.   MRN: [id number]  HPI I AM HERE TO GET MY TOENAILS TRIMMED UP AND I HAVE PEELING AND CRACKING AND DRY SKIN ON BOTH FEET    Review of Systems patient does have diabetes with history of neuropathy taking gabapentin for this history of gait problems and peripheral neuropathy as mentioned remainder other systems otherwise unremarkable and unchanged from a year ago.     Objective:   Physical Exam This is a 65 year old epicritic and male well-developed well-nourished oriented 3 presents at this time for follow-up foot and nail care. The more than a year since he was last seen nails extremely gratified thick discolored brittle overgrown tender and painful on palpation. Pedal pulses are diminished DP plus one over 4 PT nonpalpable bilateral temperature warm to cool turgor diminished patient is a history of vascular compromise over no active ulcers no open wounds no secondary infections. 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 There is history of previous ulcerations and complications currently nails thick brittle Crumley friable dystrophic and discolored tender and have not been cut in over a year. Remainder the exam unremarkable no other open wounds no ulcers no secondary infections semirigid digital contractures are confirmed       Assessment & Plan:  Assessment this time is history of peripheral vascular disease symptomology also diabetes apparently diet-controlled currently previously was on oral medications as as well as vascular disease and peripheral neuropathy-year-old present patient does have thick brittle Crumley dystrophic frontal mycotic nails 1 through 5 bilateral extremely overgrown painful symptomatic. There is also  severe dry fissured scaling and hyperkeratotic tissue around the heels arch and mid foot patient is advised to scrub these areas with soap and water and brush and use lotion twice daily to the affected areas of dry affected skin needs to maintain lotion regimen as a diabetic at all times. A full mycotic nails debrided 10 return for future palliative care every 3 months as recommended  Harriet Masson DPM

## 2014-10-05 ENCOUNTER — Other Ambulatory Visit (HOSPITAL_COMMUNITY): Payer: Self-pay | Admitting: *Deleted

## 2014-10-05 MED ORDER — SPIRONOLACTONE 25 MG PO TABS: 25.0000 mg | ORAL_TABLET | Freq: Every day | ORAL | Status: DC

## 2014-10-08 ENCOUNTER — Other Ambulatory Visit (HOSPITAL_COMMUNITY): Payer: Self-pay | Admitting: Internal Medicine

## 2014-10-30 ENCOUNTER — Encounter: Payer: Self-pay | Admitting: *Deleted

## 2014-10-30 ENCOUNTER — Encounter: Payer: Self-pay | Admitting: Internal Medicine

## 2014-10-30 ENCOUNTER — Other Ambulatory Visit: Payer: Self-pay | Admitting: *Deleted

## 2014-10-30 ENCOUNTER — Ambulatory Visit (INDEPENDENT_AMBULATORY_CARE_PROVIDER_SITE_OTHER): Payer: Medicaid Other | Admitting: Internal Medicine

## 2014-10-30 VITALS — BP 102/70 | HR 74 | Ht 67.5 in | Wt 220.6 lb

## 2014-10-30 DIAGNOSIS — Z95810 Presence of automatic (implantable) cardiac defibrillator: Secondary | ICD-10-CM | POA: Diagnosis not present

## 2014-10-30 DIAGNOSIS — I255 Ischemic cardiomyopathy: Secondary | ICD-10-CM | POA: Diagnosis not present

## 2014-10-30 DIAGNOSIS — I5022 Chronic systolic (congestive) heart failure: Secondary | ICD-10-CM | POA: Diagnosis not present

## 2014-10-30 LAB — CUP PACEART INCLINIC DEVICE CHECK
Brady Statistic RV Percent Paced: 0 %
Date Time Interrogation Session: 20160510153215
HighPow Impedance: 74.25 Ohm
Lead Channel Impedance Value: 475 Ohm
Lead Channel Pacing Threshold Pulse Width: 0.5 ms
Lead Channel Sensing Intrinsic Amplitude: 10.5 mV
Lead Channel Setting Sensing Sensitivity: 0.5 mV
MDC IDC MSMT BATTERY REMAINING LONGEVITY: 97.2 mo
MDC IDC MSMT LEADCHNL RV PACING THRESHOLD AMPLITUDE: 0.75 V
MDC IDC MSMT LEADCHNL RV PACING THRESHOLD AMPLITUDE: 0.75 V
MDC IDC MSMT LEADCHNL RV PACING THRESHOLD PULSEWIDTH: 0.5 ms
MDC IDC SET LEADCHNL RV PACING AMPLITUDE: 2.5 V
MDC IDC SET LEADCHNL RV PACING PULSEWIDTH: 0.5 ms
Pulse Gen Serial Number: 7233603
Zone Setting Detection Interval: 250 ms
Zone Setting Detection Interval: 300 ms

## 2014-10-30 LAB — BASIC METABOLIC PANEL
BUN: 20 mg/dL (ref 6–23)
CO2: 28 meq/L (ref 19–32)
Calcium: 9.5 mg/dL (ref 8.4–10.5)
Chloride: 101 mEq/L (ref 96–112)
Creatinine, Ser: 1.22 mg/dL (ref 0.40–1.50)
GFR: 77.09 mL/min (ref >60.00)
GLUCOSE: 81 mg/dL (ref 70–99)
POTASSIUM: 3.7 meq/L (ref 3.5–5.1)
Sodium: 136 mEq/L (ref 135–145)

## 2014-10-30 NOTE — Patient Instructions (Signed)
Remote monitoring is used to monitor your Pacemaker of ICD from home. This monitoring reduces the number of office visits required to check your device to one time per year. It allows [place] to keep an eye on the functioning of your device to ensure it is working properly. You are scheduled for a device check from home on 01/29/2015. You may send your transmission at any time that day. If you have a wireless device, the transmission will be sent automatically. After your physician reviews your transmission, you will receive a postcard with your next transmission date.  Your physician wants you to follow-up in: 9 months with Dr. Caryl Comes.  You will receive a reminder letter in the mail two months in advance. If you don't receive a letter, please call our office to schedule the follow-up appointment.

## 2014-10-30 NOTE — Progress Notes (Signed)
Electrophysiology Office Note   Date:  10/30/2014   ID:  [name] [name], [name] [name] 06-13-1951, MRN [id number]  PCP:  Fae Pippin  Cardiologist:   Primary Electrophysiologist:    [name], MD    Chief Complaint  Patient presents with  . Annual Exam     History of Present Illness: [name] [name] is a 64 y.o. 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 Moderate RV dysfunction. Mild MR.   .   Today, he denies  symptoms of palpitations, chest pain, shortness of breath, orthopnea, PND, lower extremity edema, claudication, dizziness, presyncope, syncope, bleeding, or neurologic sequela. The patient is tolerating medications without difficulties and is otherwise without complaint today.   He has complaints of back pain. And a sense of heaviness on his feet  Lab work was last checked prior to his ICD. Potassium 1/16 was 4.7 creatinine 1.6  Past Medical History  Diagnosis Date  . COPD (chronic obstructive pulmonary disease)   . Gout   . Emphysema   . Hypertension   . Hyperlipidemia   . Tobacco abuse     30+ pack-year history  . Diabetes mellitus without complication   . Chronic systolic CHF (congestive heart failure)     a. 04/2013 Echo: EF 15-20%, diff HK, sev HK of inf and ant myocardium, dilated LA,  b. EF 20-25% with restrictive filling pattern, mod RV dysfx, mild MR (10/10/2013);  c.  Echo 12/15: EF 25-30%  . CAD (coronary artery disease)     a. 04/2013 Cath: LM 10, LAD 40p, 70m D1 50p, LCX 60p, 934m41m, OM1 50, OM2 100 L-L collats, RCA 100p;  b. CABG x 5: LIMA->LAD, VG->D2, VG->RI->OM2, VG->PDA.  . Transaminitis     a. 04/2013 Abd U/S and CT unremarkable, hepatitis panel neg -->felt to be  2/2 acute R heart failure.  . Ischemic cardiomyopathy   . PVD (peripheral vascular disease)     a. (10/2013) ABIs: RIGHT 0.63, Waveforms: monophasic;  LEFT 0.78, Waveforms: monophasic   Past Surgical History  Procedure Laterality Date  . Appendectomy  as a child  . Multiple extractions with alveoloplasty N/A 05/03/2013    Procedure: Extraction of tooth #s 3,4,5,6,8,9,27 with alveoloplast and maxillary left osseous tuberosity reduction;  Surgeon: RoLenn CalDDS;  [place] [facility] [place];  Laterality: N/A;  . Coronary artery bypass graft N/A 05/05/2013    Procedure: CORONARY ARTERY BYPASS GRAFTING (CABG) TIMES FIVE USING LEFT INTERNAL MAMMARY ARTERY AND RIGHT AND LEFT SAPHENOUS LEG VEIN HARVESTED ENDOSCOPICALLY;  Surgeon: StMelrose NakayamaMD;  [facility]: Open [facility];  Laterality: N/A;  . Left and right heart catheterization with coronary angiogram N/A 04/28/2013    Procedure: LEFT AND RIGHT HEART CATHETERIZATION WITH CORONARY ANGIOGRAM;  Surgeon: ChBurnell BlanksMD;  [facility];  Service: Cardiovascular;  Laterality: N/A;  . Implantable cardioverter defibrillator implant N/A 07/06/2014    Procedure: IMPLANTABLE CARDIOVERTER DEFIBRILLATOR IMPLANT;  Surgeon: StDeboraha SprangMD;  [facility];  Service: Cardiovascular;  Laterality: N/A;     Current Outpatient Prescriptions  Medication Sig Dispense Refill  . allopurinol (ZYLOPRIM) 300 MG tablet  Take 300 mg by mouth daily.    Marland Kitchen aspirin 81 MG tablet Take 1 tablet (81 mg total) by mouth daily. 30 tablet 11  . atorvastatin (LIPITOR) 20 MG tablet Take 20 mg by mouth daily.    Marland Kitchen b complex vitamins tablet Take 1 tablet by mouth daily.    . carvedilol (COREG) 3.125 MG tablet Take 6.25-9.375 mg by mouth 2 (two) times daily with a meal. Takes 2 tablets in am and 3 tablets in pm    . furosemide (LASIX) 40 MG tablet Take 1.5 tablets (60 mg total) by mouth 2 (two) times daily. 90  tablet 3  . gabapentin (NEURONTIN) 300 MG capsule Take 300 mg by mouth 2 (two) times daily.    Marland Kitchen lisinopril (PRINIVIL,ZESTRIL) 5 MG tablet Take 1 tablet (5 mg total) by mouth 2 (two) times daily. 60 tablet 1  . lubiprostone (AMITIZA) 24 MCG capsule Take 24 mcg by mouth daily.     Marland Kitchen omeprazole (PRILOSEC) 20 MG capsule Take 20 mg by mouth daily.    . ondansetron (ZOFRAN) 4 MG tablet Take 4 mg by mouth every 8 (eight) hours as needed for nausea.     . potassium chloride SA (KLOR-CON M20) 20 MEQ tablet TAKE 1 TABLET (20 MEQ TOTAL) BY MOUTH DAILY. 30 tablet 2  . spironolactone (ALDACTONE) 25 MG tablet Take 1 tablet (25 mg total) by mouth daily. 30 tablet 3   No current facility-administered medications for this visit.    Allergies:   Penicillins   Social History:  The patient  reports that he has been smoking Cigarettes.  He has a 30 pack-year smoking history. He has never used smokeless tobacco. He reports that he does not drink alcohol or use illicit drugs.   Family History:  The patient's    family history includes Diabetes in his mother; Heart attack in an other family member; Hypertension in an other family member.    ROS:  Please see the history of present illness and past medical history  Otherwise, all other systems were reviewed and were negative except for sweating.     PHYSICAL EXAM: VS:  BP 102/70 mmHg  Pulse 74  Ht 5' 7.5" (1.715 m)  Wt 220 lb 9.6 oz (100.064 kg)  BMI 34.02 kg/m2 , BMI Body mass index is 34.02 kg/(m^2). GEN: Well nourished, well developed, in no acute distress HEENT: normal Neck:  JVD flat , carotid bruits, or masses Cardiac:REGULAR RATE and RHYTHM ;  No murmurs, rubs,  *No S4  Back without kyphosis; No CVAT Respiratory:  clear to auscultation bilaterally, normal work of breathing GI: soft, nontender, nondistended, + BS MS: no deformity or atrophy Extremities no clubbing cyanosis   edema Skin: warm and dry,  device pocket is well healed without  teathering Neuro:  Strength and sensation are intact Psych: euthymic mood, full affect  EKG:  EKG is ordered today. The ekg ordered today shows sinus rhythm at 74 and a slight interval 17/14/43 Right bundle branch block with left anterior fascicular block  Device interrogation is reviewed today in detail.  See PaceArt for details.   Recent Labs: 05/01/2014: Pro B Natriuretic peptide (BNP) 938.0* 06/26/2014: BUN 45*; Creatinine 1.6*; Hemoglobin 13.1; Platelets 223.0; Potassium 4.7; Sodium 134*    Lipid Panel     Component Value Date/Time   CHOL 98 05/01/2013 0356   TRIG 71 05/01/2013 0356   HDL 31* 05/01/2013 0356   CHOLHDL 3.2 05/01/2013 0356   VLDL 14 05/01/2013 0356  LDLCALC 53 05/01/2013 0356     Wt Readings from Last 3 Encounters:  10/30/14 220 lb 9.6 oz (100.064 kg)  07/07/14 223 lb (101.152 kg)  05/18/14 221 lb (100.245 kg)      Other studies Reviewed: Additional studies/ records that were reviewed today include: labs   ASSESSMENT AND PLAN:  Ischemic cardiomyopathy  Congestive heart failure-chronic-systolic  Implantable defibrillator-[employer]  Right bundle/left anterior fascicular block  Renal insufficiency grade 3  Hypertension  Hyperlipidemia  Without symptoms of ischemia  Euvolemic continue current meds  No intercurrent Ventricular tachycardia  BP well controlled   we will check renal function and potassium on his current medications-high risk-Aldactone   We will arrange for him to have follow-up with the heart failure clinic  Current medicines are reviewed at length with the patient today.   The patient does not have concerns regarding his medicines.  The following changes were made today:  none  Labs/ tests ordered today include: As above   No orders of the defined types were placed in this encounter.     Disposition:   FU with [facility] device clinic  1 year(s)  Signed, [name], MD  10/30/2014 3:21 PM     [address] [place] [address] [phone] (office) [phone] (fax)

## 2014-10-30 NOTE — Addendum Note (Signed)
Addended by: Andres Ege on: 10/30/2014 03:53 PM   Modules accepted: Orders

## 2014-11-09 ENCOUNTER — Encounter: Payer: Self-pay | Admitting: Internal Medicine

## 2014-11-23 ENCOUNTER — Other Ambulatory Visit: Payer: Self-pay | Admitting: *Deleted

## 2014-11-23 MED ORDER — POTASSIUM CHLORIDE CRYS ER 20 MEQ PO TBCR: EXTENDED_RELEASE_TABLET | ORAL | Status: DC

## 2014-12-08 ENCOUNTER — Other Ambulatory Visit (HOSPITAL_COMMUNITY): Payer: Self-pay | Admitting: Internal Medicine

## 2014-12-14 ENCOUNTER — Ambulatory Visit: Payer: Medicaid Other

## 2014-12-26 ENCOUNTER — Encounter: Payer: Self-pay | Admitting: Podiatry

## 2014-12-26 ENCOUNTER — Ambulatory Visit (INDEPENDENT_AMBULATORY_CARE_PROVIDER_SITE_OTHER): Payer: Medicaid Other | Admitting: Podiatry

## 2014-12-26 DIAGNOSIS — E1140 Type 2 diabetes mellitus with diabetic neuropathy, unspecified: Secondary | ICD-10-CM

## 2014-12-26 DIAGNOSIS — I739 Peripheral vascular disease, unspecified: Secondary | ICD-10-CM

## 2014-12-26 DIAGNOSIS — B351 Tinea unguium: Secondary | ICD-10-CM

## 2014-12-26 DIAGNOSIS — M79676 Pain in unspecified toe(s): Secondary | ICD-10-CM | POA: Diagnosis not present

## 2014-12-27 NOTE — Progress Notes (Signed)
Patient ID: [name] [name], male   DOB: 08-11-1950, 64 y.o.   MRN: [id number] Complaint:  Visit Type: Patient returns to my office for continued preventative foot care services. Complaint: Patient states" my nails have grown long and thick and become painful to walk and wear shoes" Patient has been diagnosed with DM with neuropathy.Marland Kitchen He presents for preventative foot care services. No changes to ROS  Podiatric Exam: Vascular: dorsalis pedis and posterior tibial pulses are palpable bilateral. Capillary return is immediate. Temperature gradient is WNL. Skin turgor WNL  Sensorium: Abnormal  Semmes Weinstein monofilament test. Diminished  tactile sensation bilaterally. Nail Exam: Pt has thick disfigured discolored nails with subungual debris noted bilateral entire nail hallux through fifth toenails Ulcer Exam: There is no evidence of ulcer or pre-ulcerative changes or infection. Orthopedic Exam: Muscle tone and strength are WNL. No limitations in general ROM. No crepitus or effusions noted. Foot type and digits show no abnormalities. Bony prominences are unremarkable. Skin: No Porokeratosis. No infection or ulcers  Diagnosis:  Tinea unguium, Pain in right toe, pain in left toes  Treatment & Plan Procedures and Treatment: Consent by patient was obtained for treatment procedures. The patient understood the discussion of treatment and procedures well. All questions were answered thoroughly reviewed. Debridement of mycotic and hypertrophic toenails, 1 through 5 bilateral and clearing of subungual debris. No ulceration, no infection noted.  Return Visit-Office Procedure: Patient instructed to return to the office for a follow up visit 3 months for continued evaluation and treatment.

## 2015-01-02 ENCOUNTER — Other Ambulatory Visit: Payer: Self-pay | Admitting: Internal Medicine

## 2015-01-02 ENCOUNTER — Other Ambulatory Visit (HOSPITAL_COMMUNITY): Payer: Self-pay | Admitting: Internal Medicine

## 2015-01-10 ENCOUNTER — Other Ambulatory Visit (HOSPITAL_COMMUNITY): Payer: Self-pay

## 2015-01-10 MED ORDER — CARVEDILOL 3.125 MG PO TABS: ORAL_TABLET | ORAL | Status: DC

## 2015-01-29 ENCOUNTER — Ambulatory Visit (INDEPENDENT_AMBULATORY_CARE_PROVIDER_SITE_OTHER): Payer: Medicaid Other | Admitting: *Deleted

## 2015-01-29 ENCOUNTER — Telehealth: Payer: Self-pay | Admitting: Cardiology

## 2015-01-29 DIAGNOSIS — I255 Ischemic cardiomyopathy: Secondary | ICD-10-CM | POA: Diagnosis not present

## 2015-01-29 DIAGNOSIS — I5022 Chronic systolic (congestive) heart failure: Secondary | ICD-10-CM | POA: Diagnosis not present

## 2015-01-29 NOTE — Telephone Encounter (Signed)
Spoke with pt and reminded pt of remote transmission that is due today. Pt verbalized understanding.   

## 2015-01-30 ENCOUNTER — Encounter: Payer: Self-pay | Admitting: Cardiology

## 2015-02-02 NOTE — Progress Notes (Signed)
ICD remote received

## 2015-02-05 ENCOUNTER — Other Ambulatory Visit: Payer: Self-pay | Admitting: Cardiology

## 2015-02-06 LAB — CUP PACEART REMOTE DEVICE CHECK
Battery Remaining Percentage: 93 %
Battery Voltage: 3.19 V
Brady Statistic RV Percent Paced: 1 %
HIGH POWER IMPEDANCE MEASURED VALUE: 74 Ohm
HighPow Impedance: 74 Ohm
Lead Channel Impedance Value: 450 Ohm
Lead Channel Pacing Threshold Amplitude: 0.75 V
Lead Channel Sensing Intrinsic Amplitude: 6.5 mV
Lead Channel Setting Pacing Amplitude: 2.5 V
Lead Channel Setting Pacing Pulse Width: 0.5 ms
Lead Channel Setting Sensing Sensitivity: 0.5 mV
MDC IDC MSMT BATTERY REMAINING LONGEVITY: 95 mo
MDC IDC MSMT LEADCHNL RV PACING THRESHOLD PULSEWIDTH: 0.5 ms
MDC IDC SESS DTM: 20160809182210
Pulse Gen Serial Number: 7233603
Zone Setting Detection Interval: 250 ms
Zone Setting Detection Interval: 300 ms

## 2015-02-11 ENCOUNTER — Telehealth: Payer: Self-pay | Admitting: Internal Medicine

## 2015-02-11 NOTE — Telephone Encounter (Signed)
Returned patient's call about letter.  Explained that remote transmission was received around the same time as the letter was sent so he could disregard the letter.  Patient voiced understanding and is appreciative of call back.  Patient made aware that his next remote date is 04/30/2015.  Patient denies any additional questions or concerns at this time.

## 2015-02-11 NOTE — Telephone Encounter (Signed)
New message      Pt got a letter from [place] stating we did not get his transmission.  Please call and help him with his transmission

## 2015-03-01 ENCOUNTER — Encounter: Payer: Self-pay | Admitting: Cardiology

## 2015-03-12 ENCOUNTER — Encounter: Payer: Self-pay | Admitting: Internal Medicine

## 2015-03-18 ENCOUNTER — Encounter: Payer: Self-pay | Admitting: Family

## 2015-03-20 ENCOUNTER — Ambulatory Visit (HOSPITAL_COMMUNITY)
Admission: RE | Admit: 2015-03-20 | Discharge: 2015-03-20 | Disposition: A | Discharge status: Home / Self Care | Payer: Medicaid Other | Source: Ambulatory Visit | Attending: Family | Admitting: Family

## 2015-03-20 ENCOUNTER — Emergency Department (HOSPITAL_COMMUNITY): Payer: Medicaid Other

## 2015-03-20 ENCOUNTER — Encounter (HOSPITAL_COMMUNITY): Payer: Self-pay | Admitting: Emergency Medicine

## 2015-03-20 ENCOUNTER — Other Ambulatory Visit: Payer: Self-pay | Admitting: Student

## 2015-03-20 ENCOUNTER — Other Ambulatory Visit: Payer: Self-pay

## 2015-03-20 ENCOUNTER — Emergency Department (HOSPITAL_COMMUNITY)
Admission: EM | Admit: 2015-03-20 | Discharge: 2015-03-20 | Disposition: A | Discharge status: Home / Self Care | Payer: Medicaid Other | Attending: Emergency Medicine | Admitting: Emergency Medicine

## 2015-03-20 ENCOUNTER — Ambulatory Visit (INDEPENDENT_AMBULATORY_CARE_PROVIDER_SITE_OTHER): Payer: Medicaid Other | Admitting: Family

## 2015-03-20 ENCOUNTER — Encounter: Payer: Self-pay | Admitting: Family

## 2015-03-20 VITALS — BP 104/71 | HR 72 | Temp 97.0°F | Resp 16 | Ht 67.0 in | Wt 229.0 lb

## 2015-03-20 DIAGNOSIS — E119 Type 2 diabetes mellitus without complications: Secondary | ICD-10-CM | POA: Diagnosis not present

## 2015-03-20 DIAGNOSIS — Z79899 Other long term (current) drug therapy: Secondary | ICD-10-CM | POA: Diagnosis not present

## 2015-03-20 DIAGNOSIS — Z720 Tobacco use: Secondary | ICD-10-CM | POA: Diagnosis not present

## 2015-03-20 DIAGNOSIS — I5022 Chronic systolic (congestive) heart failure: Secondary | ICD-10-CM | POA: Diagnosis not present

## 2015-03-20 DIAGNOSIS — Z7982 Long term (current) use of aspirin: Secondary | ICD-10-CM | POA: Insufficient documentation

## 2015-03-20 DIAGNOSIS — I2510 Atherosclerotic heart disease of native coronary artery without angina pectoris: Secondary | ICD-10-CM | POA: Insufficient documentation

## 2015-03-20 DIAGNOSIS — R079 Chest pain, unspecified: Secondary | ICD-10-CM

## 2015-03-20 DIAGNOSIS — F17200 Nicotine dependence, unspecified, uncomplicated: Secondary | ICD-10-CM | POA: Insufficient documentation

## 2015-03-20 DIAGNOSIS — M109 Gout, unspecified: Secondary | ICD-10-CM | POA: Diagnosis not present

## 2015-03-20 DIAGNOSIS — I10 Essential (primary) hypertension: Secondary | ICD-10-CM | POA: Diagnosis not present

## 2015-03-20 DIAGNOSIS — I255 Ischemic cardiomyopathy: Secondary | ICD-10-CM

## 2015-03-20 DIAGNOSIS — R062 Wheezing: Secondary | ICD-10-CM

## 2015-03-20 DIAGNOSIS — I70219 Atherosclerosis of native arteries of extremities with intermittent claudication, unspecified extremity: Secondary | ICD-10-CM

## 2015-03-20 DIAGNOSIS — Z95810 Presence of automatic (implantable) cardiac defibrillator: Secondary | ICD-10-CM | POA: Insufficient documentation

## 2015-03-20 DIAGNOSIS — R0789 Other chest pain: Secondary | ICD-10-CM

## 2015-03-20 DIAGNOSIS — J441 Chronic obstructive pulmonary disease with (acute) exacerbation: Secondary | ICD-10-CM | POA: Insufficient documentation

## 2015-03-20 DIAGNOSIS — I25810 Atherosclerosis of coronary artery bypass graft(s) without angina pectoris: Secondary | ICD-10-CM

## 2015-03-20 DIAGNOSIS — Z880 Allergy status to penicillin: Secondary | ICD-10-CM | POA: Diagnosis not present

## 2015-03-20 DIAGNOSIS — E785 Hyperlipidemia, unspecified: Secondary | ICD-10-CM | POA: Diagnosis not present

## 2015-03-20 LAB — I-STAT TROPONIN, ED
TROPONIN I, POC: 0.01 ng/mL (ref 0.00–0.08)
Troponin i, poc: 0.01 ng/mL (ref 0.00–0.08)

## 2015-03-20 LAB — BASIC METABOLIC PANEL
Anion gap: 6 (ref 5–15)
BUN: 30 mg/dL — ABNORMAL HIGH (ref 6–20)
CHLORIDE: 107 mmol/L (ref 101–111)
CO2: 24 mmol/L (ref 22–32)
CREATININE: 1.37 mg/dL — AB (ref 0.61–1.24)
Calcium: 9.3 mg/dL (ref 8.9–10.3)
GFR calc non Af Amer: 53 mL/min — ABNORMAL LOW (ref >60)
Glucose, Bld: 88 mg/dL (ref 65–99)
POTASSIUM: 4.3 mmol/L (ref 3.5–5.1)
SODIUM: 137 mmol/L (ref 135–145)

## 2015-03-20 LAB — CBC
HEMATOCRIT: 41.8 % (ref 39.0–52.0)
HEMOGLOBIN: 13.6 g/dL (ref 13.0–17.0)
MCH: 28.7 pg (ref 26.0–34.0)
MCHC: 32.5 g/dL (ref 30.0–36.0)
MCV: 88.2 fL (ref 78.0–100.0)
PLATELETS: 206 10*3/uL (ref 150–400)
RBC: 4.74 MIL/uL (ref 4.22–5.81)
RDW: 16.1 % — ABNORMAL HIGH (ref 11.5–15.5)
WBC: 9.2 10*3/uL (ref 4.0–10.5)

## 2015-03-20 LAB — CBG MONITORING, ED
GLUCOSE-CAPILLARY: 82 mg/dL (ref 65–99)
Glucose-Capillary: 84 mg/dL (ref 65–99)

## 2015-03-20 LAB — BRAIN NATRIURETIC PEPTIDE: B NATRIURETIC PEPTIDE 5: 71.6 pg/mL (ref 0.0–100.0)

## 2015-03-20 MED ORDER — IPRATROPIUM-ALBUTEROL 0.5-2.5 (3) MG/3ML IN SOLN
3.0000 mL | Freq: Once | RESPIRATORY_TRACT | Status: AC
  Administered 2015-03-20: 3 mL via RESPIRATORY_TRACT
  Filled 2015-03-20: qty 3

## 2015-03-20 MED ORDER — BUDESONIDE-FORMOTEROL FUMARATE 80-4.5 MCG/ACT IN AERO: 2.0000 | INHALATION_SPRAY | Freq: Two times a day (BID) | RESPIRATORY_TRACT | Status: DC

## 2015-03-20 NOTE — ED Notes (Signed)
Patient ambulated sats above 99%

## 2015-03-20 NOTE — Discharge Instructions (Signed)
You have a Stress Test scheduled at  [facility]. Your doctor has ordered this test to get a better idea of how your heart works. ° °Please arrive 15 minutes early for paperwork.  ° °Location: °[address], [address] °[place], [place] [address] °[phone] ° °Instructions: °· No food/drink after midnight the night before.  °· No caffeine/decaf products 24 hours before, including medicines such as Excedrin or Goody Powders. Call if there are any questions.  °· Wear comfortable clothes and shoes.  °· It is OK to take your morning meds with a sip of water EXCEPT for those types of medicines listed below or otherwise instructed. ° °Special Medication Instructions: °· Beta blockers such as metoprolol (Lopressor/Toprol XL), atenolol (Tenormin), carvedilol (Coreg), nebivolol (Bystolic), propranolol (Inderal) should not be taken for 24 hours before the test. °· Calcium channel blockers such as diltiazem (Cardizem) or verapmil (Calan) should not be taken for 24 hours before the test. °· Remove nitroglycerin patches and do not take nitrate preparations such as Imdur/isosorbide the day of your test. °· No Persantine/Theophylline or Aggrenox medicines should be used within 24 hours of the test.  ° °What To Expect: °The whole test will take several hours. When you arrive in the lab, the technician will inject a small amount of radioactive tracer into your arm through an IV while you are resting quietly. This helps us to form pictures of your heart. You will likely only feel a sting from the IV. After a waiting period, resting pictures will be obtained under a big camera. These are the "before" pictures. ° °Next, you will be prepped for the stress portion of the test. This may include either walking on a treadmill or receiving a medicine that helps to dilate blood vessels in your heart to simulate the effect of exercise on your heart. If you are walking on a treadmill, you will walk at different paces to  try to get your heart rate to a goal number that is based on your age. If your doctor has chosen the pharmacologic test, then you will receive a medicine through your IV that may cause temporary nausea, flushing, shortness of breath and sometimes chest discomfort or vomiting. This is typically short-lived and usually resolves quickly. Your blood pressure and heart rate will be monitored, and we will be watching your EKG on a computer screen for any changes. During this portion of the test, the radiologist will inject another small amount of radioactive tracer into your IV. After a waiting period, you will undergo a second set of pictures. These are the "after" pictures. ° °The doctor reading the test will compare the before-and-after images to look for evidence of heart blockages or heart weakness. In certain instances, this test is done over 2 days but usually only takes 1 day to complete. ° °

## 2015-03-20 NOTE — ED Notes (Signed)
Patient states he has had Chest tightness for the past 3 months states saw his Vascular MD today and was advised to come to ER to be check out. Patient has had bypass surgery in the past . Patient states the pain does not radiate and denies any SOB, N/V with it. Patient was given 324 ASA and 2 nitro. Patient did become hypotensive at 98 after 2nd nitro.

## 2015-03-20 NOTE — Progress Notes (Signed)
[facility] [place] HISTORY AND PHYSICAL -PAD  History of Present Illness [name] [name] is a 64 y.o. male patient of Dr. Scot Dock who developed a blister on both feet approximately Sept., 2014. The blister on the left foot healed. He was having a persistent wound in the right foot and was referred for vascular consultation. He did not remember any specific injury to the foot. Dr. Scot Dock did not get any clear-cut history of claudication although it sounded like his activity was fairly limited. He did describe some rest pain in the right foot. However, this seems to be improving as the wound on his foot has healed. He does have a history of tobacco use but is trying to quit currently.  At his Dec., 2014 visit with Dr. 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 However, the wound on the dorsum of his right foot had healed at that point. His symptoms in the right foot were improving. For this reason Dr. Scot Dock held off on arteriography. He has had veins taken from both legs for his 5 vessel CABG in November, 2014. Based on his duplex, it is unlikely that he would have disease amenable to angioplasty on the right as the disease is fairly diffuse.  He has pain at posterior aspect of both calves and his low back after about 5-7 minutes walking on a treadmill, relieved by rest, he denies any recurring wounds, the past wounds on his right foot has healed.  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  He goes to cardiac rehab 3 days/week, walks 1-3 miles daily.  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  He has CAD, denies any history of stroke or TIA.   Pt c/o intermittent chest tightness for the last 2-3 months, somewhat relieved by albuterol neb; he states that he has NTG tablets but has not tried taking them. He states that he has not informed  his cardiologist or PCP. Pt denies nausea with chest tightness, states he always has excessive sweating. He is walking more since his last visit.  Pt has not had previous intervention for PAD .   The patient reports New Medical or Surgical History: 07/06/14 had ICD implanted, sees Dr. Caryl Comes, electrophysiologist.  Pt Diabetic: Yes, pt states in control Pt smoker: smoker (1/2 ppd, started smoking at age 86 yrs), he also gets second hand smoke from his girlfriend who smokes in the house   Pt meds include:  Statin :Yes  Betablocker: Yes  ASA: Yes  Other anticoagulants/antiplatelets: no     Past Medical History  Diagnosis Date  . COPD (chronic obstructive pulmonary disease)   . Gout   . Emphysema   . Hypertension   . Hyperlipidemia   . Tobacco abuse     30+ pack-year history  . Diabetes mellitus without complication   . Chronic systolic CHF (congestive heart failure)     a. 04/2013 Echo: EF 15-20%, diff HK, sev HK of inf and ant myocardium, dilated LA,  b. EF 20-25% with restrictive filling pattern, mod RV dysfx, mild MR (10/10/2013);  c.  Echo 12/15: EF 25-30%  . CAD (coronary artery disease)     a. 04/2013 Cath: LM 10, LAD 40p, 3m D1 50p, LCX 60p, 97m68m, OM1 50, OM2 100 L-L collats, RCA 100p;  b. CABG x 5: LIMA->LAD, VG->D2, VG->RI->OM2, VG->PDA.  . Transaminitis  a. 04/2013 Abd U/S and CT unremarkable, hepatitis panel neg -->felt to be 2/2 acute R heart failure.  . Ischemic cardiomyopathy   . PVD (peripheral vascular disease)     a. (10/2013) ABIs: RIGHT 0.63, Waveforms: monophasic;  LEFT 0.78, Waveforms: monophasic    Social History Social History  Substance Use Topics  . Smoking status: Current Every Day Smoker -- 1.00 packs/day for 30 years    Types: Cigarettes  . Smokeless tobacco: Never Used     Comment: smokes 7-8 cigarettes a day  . Alcohol Use: No    Family History Family History  Problem Relation Age of Onset  . Diabetes Mother   . Hypertension     . Heart attack      Brothers    Past Surgical History  Procedure Laterality Date  . Appendectomy  as a child  . Multiple extractions with alveoloplasty N/A 05/03/2013    Procedure: Extraction of tooth #s 3,4,5,6,8,9,27 with alveoloplast and maxillary left osseous tuberosity reduction;  Surgeon: Lenn Cal, DDS;  Location: [facility];  Service: Oral Surgery;  Laterality: N/A;  . Coronary artery bypass graft N/A 05/05/2013    Procedure: CORONARY ARTERY BYPASS GRAFTING (CABG) TIMES FIVE USING LEFT INTERNAL MAMMARY ARTERY AND RIGHT AND LEFT SAPHENOUS LEG VEIN HARVESTED ENDOSCOPICALLY;  Surgeon: Melrose Nakayama, MD;  Location: [place];  Service: Open Heart Surgery;  Laterality: N/A;  . Left and right heart catheterization with coronary angiogram N/A 04/28/2013    Procedure: LEFT AND RIGHT HEART CATHETERIZATION WITH CORONARY ANGIOGRAM;  Surgeon: Burnell Blanks, MD;  Location: [facility] CATH LAB;  Service: Cardiovascular;  Laterality: N/A;  . Implantable cardioverter defibrillator implant N/A 07/06/2014    Procedure: IMPLANTABLE CARDIOVERTER DEFIBRILLATOR IMPLANT;  Surgeon: Deboraha Sprang, MD;  Location: [facility] CATH LAB;  Service: Cardiovascular;  Laterality: N/A;    Allergies  Allergen Reactions  . Penicillins Other (See Comments)    Swelling , long time ago.     Current Outpatient Prescriptions  Medication Sig Dispense Refill  . allopurinol (ZYLOPRIM) 300 MG tablet Take 300 mg by mouth daily.    Marland Kitchen aspirin EC 81 MG tablet TAKE ONE TABLET BY MOUTH ONCE DAILY 30 tablet 6  . atorvastatin (LIPITOR) 20 MG tablet Take 20 mg by mouth daily.    Marland Kitchen b complex vitamins tablet Take 1 tablet by mouth daily.    . carvedilol (COREG) 3.125 MG tablet Takes 2 tablets in am and 3 tablets in pm 150 tablet 6  . furosemide (LASIX) 40 MG tablet TAKE 1 AND 1/2 TABLETS BY MOUTH TWICE DAILY 90 tablet 3  . gabapentin (NEURONTIN) 300 MG capsule Take 300 mg by mouth 2 (two) times daily.    Marland Kitchen lisinopril  (PRINIVIL,ZESTRIL) 5 MG tablet TAKE ONE TABLET BY MOUTH TWICE DAILY 60 tablet 3  . lubiprostone (AMITIZA) 24 MCG capsule Take 24 mcg by mouth daily.     Marland Kitchen omeprazole (PRILOSEC) 20 MG capsule Take 20 mg by mouth daily.    . potassium chloride SA (KLOR-CON M20) 20 MEQ tablet TAKE 1 TABLET (20 MEQ TOTAL) BY MOUTH DAILY. 30 tablet 11  . spironolactone (ALDACTONE) 25 MG tablet TAKE ONE TABLET BY MOUTH ONCE DAILY 30 tablet 3  . Tetrahydrozoline HCl (EYE DROPS OP) Apply to eye.    . ondansetron (ZOFRAN) 4 MG tablet Take 4 mg by mouth every 8 (eight) hours as needed for nausea.      No current facility-administered medications for this visit.  ROS: See HPI for pertinent positives and negatives.   Physical Examination  Filed Vitals:   03/20/15 1044  BP: 104/71  Pulse: 72  Temp: 97 F (36.1 C)  TempSrc: Oral  Resp: 16  Height: '5\' 7"'$  (1.702 m)  Weight: 229 lb (103.874 kg)  SpO2: 98%   Body mass index is 35.86 kg/(m^2).  General: A&O x 3, WDWN, obese male.  Gait: using cane, slow and deliberate  Eyes: Pupils =.  Pulmonary: CTAB, without wheezes , rales or rhonchi, decreased air movement in posterior fields, occasional moist cough  Cardiac: regular Rythm , without detected murmur. ICD palpated subcutaneously left side of chest.  Carotid Bruits  Left  Right    Negative  Negative    Aorta is not palpable.  Radial pulses: are 2+ palpable and =   VASCULAR EXAM:  Extremities without ischemic changes  without Gangrene; without open wounds. Feet are slightly ruddy, skin is dry, evidence of healed ulcer at dorsum of right foot, onychomycosis all toenails   LE Pulses  LEFT  RIGHT   FEMORAL   palpable   palpable   POPLITEAL  not palpable  not palpable   POSTERIOR TIBIAL  not palpable, monophasic by Doppler  not palpable, monophasic by Doppler  DORSALIS PEDIS  ANTERIOR TIBIAL  not palpable, monophasic by Doppler  not palpable, monophasic by Doppler   PERONEAL  not Palpable  not Palpable    Abdomen: soft, NT, no masses palpated.  Skin: no rashes, no ulcers.  Musculoskeletal: no muscle wasting or atrophy.  Neurologic: A&O X 3; Appropriate Affect ; SENSATION: normal; MOTOR FUNCTION: moving all extremities equally, motor strength 5/5 in UE's, 4/5 in LE's. Speech is fluent/normal. CN 2-12 intact.           Non-Invasive Vascular Imaging: DATE: 03/20/2015 ABI (Date: 03/20/2015)  R: 0.68 (0.60, 03/13/14), DP: monophasic, PT: monophasic, TBI: 0.54  L: 0.75 (0.80), DP: monophasic, PT: monophasic, TBI: 0.58   ASSESSMENT: [name] [name] is a 64 y.o. 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 He has an equal degree of pain at the posterior aspects of both calves after walking on a treadmill for 5-7 minutes, relieved by rest.  Bilateral LE arterial Duplex result from Dec., 2014 showed diffuse plaque throughout lower extremities. The ulcers in his feet have healed. Discussed with Dr. Scot Dock pt current and intermittent chest tightness in this pt with a history of CAD and recent ICD placement; pt advised to have the person who drove him to our office to take him to [facility] ED now for evaluation. However, the person who dove him here is unable to take pt to the ED, EMS notified, report given to EMS personnel, pt taken by stretcher on cardiac monitor to [facility] ED.  I spoke with triage nurse at [facility] and gave her a brief HPI, let her know pt is on the way there.  Face to face time with patient was 25 minutes. Over 50% of this time was spent on counseling and coordination of care.   PLAN:  The patient was counseled re smoking cessation and given several free resources re smoking cessation. Continue graduated walking program. Based on the patient's vascular studies and examination, pt will return to clinic in 1 year with ABI's.   I discussed in depth  with the patient the nature of atherosclerosis, and emphasized the importance of maximal medical management including strict control  of blood pressure, blood glucose, and lipid levels, obtaining regular exercise, and cessation of smoking.  The patient is aware that without maximal medical management the underlying atherosclerotic disease process will progress, limiting the benefit of any interventions.  The patient was given information about PAD including signs, symptoms, treatment, what symptoms should prompt the patient to seek immediate medical care, and risk reduction measures to take.  Clemon Chambers, RN, MSN, FNP-C [facility] [employer] Phone: [phone]  Clinic MD: Scot Dock  03/20/2015 10:57 AM

## 2015-03-20 NOTE — ED Provider Notes (Signed)
Please see previous physicians note regarding patient's presenting history and physical, initial ED course, and associated MDM. In short this is 64 year old male with CAD, CABG, ischemic cardiomyopathy, and COPD who presents with chest tightness. At time of sign out he is pending cardiology evaluation and consult. At that time he had nonischemic EKG as well as negative serial troponins. Cardiology did not feel that chest tightness was ACS in nature. They felt that this was more likely due to his COPD. I he is noncompliant with his inhalers typically. On my exam his lungs are clear to to auscultation, and he is able to ambulate without hypoxia or significant tachycardia. He does not have worsening chest pain or shortness of breath with ambulation. Cardiology does feel comfortable with discharge home and outpatient close cardiology follow-up, which is arranged for him. They recommended a new steroid inhaler for him, which is prescribed. Strict return follow-up instructions are reviewed. He expressed understanding of all discharge instructions and felt comfortable to plan of care.  Forde Dandy, MD 03/20/15 Joen Laura

## 2015-03-20 NOTE — ED Notes (Signed)
Meal tray ordered 

## 2015-03-20 NOTE — Patient Instructions (Addendum)
Intermittent Claudication Blockage of leg arteries results from poor circulation of blood in the leg arteries. This produces an aching, tired, and sometimes burning pain in the legs that is brought on by exercise and made better by rest. Claudication refers to the limping that happens from leg cramps. It is also referred to as Vaso-occlusive disease of the legs, arterial insufficiency of the legs, recurrent leg pain, recurrent leg cramping and calf pain with exercise.  CAUSES  This condition is due to narrowing or blockage of the arteries (muscular vessels which carry blood away from the heart and around the body). Blockage of arteries can occur anywhere in the body. If they occur in the heart, a person may experience angina (chest pain) or even a heart attack. If they occur in the neck or the brain, a person may have a stroke. Intermittent claudication is when the blockage occurs in the legs, most commonly in the calf or the foot.  Atherosclerosis, or blockage of arteries, can occur for many reasons. Some of these are smoking, diabetes, and high cholesterol. SYMPTOMS  Intermittent claudication may occur in both legs, and it often continues to get worse over time. However, some people complain only of weakness in the legs when walking, or a feeling of "tiredness" in the buttocks. Impotence (not able to have an erection) is an occasional complaint in men. Pain while resting is uncommon.  WHAT TO EXPECT AT YOUR HEALTH CARE PROVIDER'S OFFICE: Your medical history will be asked for and a physical examination will be performed. Medical history questions documenting claudication in detail may include:   Time pattern  Do you have leg cramps at night (nocturnal cramps)?  How often does leg pain with cramping occur?  Is it getting worse?  What is the quality of the pain?  Is the pain sharp?  Is there an aching pain with the cramps?  Aggravating factors  Is it worse after you exercise?  Is it  worse after you are standing for a while?  Do you smoke? How much?  Do you drink alcohol? How much?  Are you diabetic? How well is your blood sugar controlled?  Other  What other symptoms are also present?  Has there been impotence (men)?  Is there pain in the back?  Is there a darkening of the skin of the legs, feet or toes?  Is there weakness or paralysis of the legs? The physical examination may include evaluation of the femoral pulse (in the groin) and the other areas where the pulse can be felt in the legs. DIAGNOSIS  Diagnostic tests that may be performed include:  Blood pressure measured in arms and legs for comparison.  Doppler ultrasonography on the legs and the heart.  Duplex Doppler/ultrasound exam of extremity to visualize arterial blood flow.  ECG- to evaluate the activity of your heart.  Aortography- to visualize blockages in your arteries. TREATMENT Surgical treatment may be suggested if claudication interferes with the patient's activities or work, and if the diseased arteries do not seem to be improving after treatment. Be aware that this condition can worsen over time and you should carefully monitor your condition. HOME CARE INSTRUCTIONS  Talk to your caregiver about the cause of your leg cramping and about what to do at home to relieve it.  A healthy diet is important to lessen the likeliness of atherosclerosis.  A program of daily walking for short periods, and stopping for pain or cramping, may help improve function.  It is important to   stop smoking.  Avoid putting hot or cold items on legs.  Avoid tight shoes. SEEK MEDICAL CARE IF: There are many other causes of leg pain such as arthritis or low blood potassium. However, some causes of leg pain may be life threatening such as a blood clot in the legs. Seek medical attention if you have:  Leg pain that does not go away.  Legs that may be red, hot or swollen.  Ulcers or sores appear on your  ankle or foot.  Any chest pain or shortness of breath accompanying leg pain.  Diabetes.  You are pregnant. SEEK IMMEDIATE MEDICAL CARE IF:   Your leg pain becomes severe or will not go away.  Your foot turns blue or a dark color.  Your leg becomes red, hot or swollen or you develop a fever over 102F.  Any chest pain or shortness of breath accompanying leg pain. MAKE SURE YOU:   Understand these instructions.  Will watch your condition.  Will get help right away if you are not doing well or get worse. Document Released: 04/10/2004 Document Revised: 08/31/2011 Document Reviewed: 09/14/2013 [facility] Patient Information 2015 [place], [place]. This information is not intended to replace advice given to you by your health care provider. Make sure you discuss any questions you have with your health care provider.     Smoking Cessation Quitting smoking is important to your health and has many advantages. However, it is not always easy to quit since nicotine is a very addictive drug. Oftentimes, people try 3 times or more before being able to quit. This document explains the best ways for you to prepare to quit smoking. Quitting takes hard work and a lot of effort, but you can do it. ADVANTAGES OF QUITTING SMOKING  You will live longer, feel better, and live better.  Your body will feel the impact of quitting smoking almost immediately.  Within 20 minutes, blood pressure decreases. Your pulse returns to its normal level.  After 8 hours, carbon monoxide levels in the blood return to normal. Your oxygen level increases.  After 24 hours, the chance of having a heart attack starts to decrease. Your breath, hair, and body stop smelling like smoke.  After 48 hours, damaged nerve endings begin to recover. Your sense of taste and smell improve.  After 72 hours, the body is virtually free of nicotine. Your bronchial tubes relax and breathing becomes easier.  After 2 to 12 weeks, lungs  can hold more air. Exercise becomes easier and circulation improves.  The risk of having a heart attack, stroke, cancer, or lung disease is greatly reduced.  After 1 year, the risk of coronary heart disease is cut in half.  After 5 years, the risk of stroke falls to the same as a nonsmoker.  After 10 years, the risk of lung cancer is cut in half and the risk of other cancers decreases significantly.  After 15 years, the risk of coronary heart disease drops, usually to the level of a nonsmoker.  If you are pregnant, quitting smoking will improve your chances of having a healthy baby.  The people you live with, especially any children, will be healthier.  You will have extra money to spend on things other than cigarettes. QUESTIONS TO THINK ABOUT BEFORE ATTEMPTING TO QUIT You may want to talk about your answers with your health care provider.  Why do you want to quit?  If you tried to quit in the past, what helped and what did not?  What will be the most difficult situations for you after you quit? How will you plan to handle them?  Who can help you through the tough times? Your family? Friends? A health care provider?  What pleasures do you get from smoking? What ways can you still get pleasure if you quit? Here are some questions to ask your health care provider:  How can you help me to be successful at quitting?  What medicine do you think would be best for me and how should I take it?  What should I do if I need more help?  What is smoking withdrawal like? How can I get information on withdrawal? GET READY  Set a quit date.  Change your environment by getting rid of all cigarettes, ashtrays, matches, and lighters in your home, car, or work. Do not let people smoke in your home.  Review your past attempts to quit. Think about what worked and what did not. GET SUPPORT AND ENCOURAGEMENT You have a better chance of being successful if you have help. You can get support in  many ways.  Tell your family, friends, and coworkers that you are going to quit and need their support. Ask them not to smoke around you.  Get individual, group, or telephone counseling and support. Programs are available at [employer] and health centers. Call your local health department for information about programs in your area.  Spiritual beliefs and practices may help some smokers quit.  Download a "quit meter" on your computer to keep track of quit statistics, such as how long you have gone without smoking, cigarettes not smoked, and money saved.  Get a self-help book about quitting smoking and staying off tobacco. [place] yourself from urges to smoke. Talk to someone, go for a walk, or occupy your time with a task.  Change your normal routine. Take a different route to work. Drink tea instead of coffee. Eat breakfast in a different place.  Reduce your stress. Take a hot bath, exercise, or read a book.  Plan something enjoyable to do every day. Reward yourself for not smoking.  Explore interactive web-based programs that specialize in helping you quit. GET MEDICINE AND USE IT CORRECTLY Medicines can help you stop smoking and decrease the urge to smoke. Combining medicine with the above behavioral methods and support can greatly increase your chances of successfully quitting smoking.  Nicotine replacement therapy helps deliver nicotine to your body without the negative effects and risks of smoking. Nicotine replacement therapy includes nicotine gum, lozenges, inhalers, nasal sprays, and skin patches. Some may be available over-the-counter and others require a prescription.  Antidepressant medicine helps people abstain from smoking, but how this works is unknown. This medicine is available by prescription.  Nicotinic receptor partial agonist medicine simulates the effect of nicotine in your brain. This medicine is available by prescription. Ask  your health care provider for advice about which medicines to use and how to use them based on your health history. Your health care provider will tell you what side effects to look out for if you choose to be on a medicine or therapy. Carefully read the information on the package. Do not use any other product containing nicotine while using a nicotine replacement product.  RELAPSE OR DIFFICULT SITUATIONS Most relapses occur within the first 3 months after quitting. Do not be discouraged if you start smoking again. Remember, most people try several times before finally quitting. You may have symptoms  of withdrawal because your body is used to nicotine. You may crave cigarettes, be irritable, feel very hungry, cough often, get headaches, or have difficulty concentrating. The withdrawal symptoms are only temporary. They are strongest when you first quit, but they will go away within 10-14 days. To reduce the chances of relapse, try to:  Avoid drinking alcohol. Drinking lowers your chances of successfully quitting.  Reduce the amount of caffeine you consume. Once you quit smoking, the amount of caffeine in your body increases and can give you symptoms, such as a rapid heartbeat, sweating, and anxiety.  Avoid smokers because they can make you want to smoke.  Do not let weight gain distract you. Many smokers will gain weight when they quit, usually less than 10 pounds. Eat a healthy diet and stay active. You can always lose the weight gained after you quit.  Find ways to improve your mood other than smoking. FOR MORE INFORMATION  www.smokefree.gov  Document Released: 06/02/2001 Document Revised: 10/23/2013 Document Reviewed: 09/17/2011 [facility] Patient Information 2015 [place], [place]. This information is not intended to replace advice given to you by your health care provider. Make sure you discuss any questions you have with your health care provider.    Smoking Cessation, Tips for Success If you  are ready to quit smoking, congratulations! You have chosen to help yourself be healthier. Cigarettes bring nicotine, tar, carbon monoxide, and other irritants into your body. Your lungs, heart, and blood vessels will be able to work better without these poisons. There are many different ways to quit smoking. Nicotine gum, nicotine patches, a nicotine inhaler, or nicotine nasal spray can help with physical craving. Hypnosis, support groups, and medicines help break the habit of smoking. WHAT THINGS CAN I DO TO MAKE QUITTING EASIER?  Here are some tips to help you quit for good:  Pick a date when you will quit smoking completely. Tell all of your friends and family about your plan to quit on that date.  Do not try to slowly cut down on the number of cigarettes you are smoking. Pick a quit date and quit smoking completely starting on that day.  Throw away all cigarettes.   Clean and remove all ashtrays from your home, work, and car.  On a card, write down your reasons for quitting. Carry the card with you and read it when you get the urge to smoke.  Cleanse your body of nicotine. Drink enough water and fluids to keep your urine clear or pale yellow. Do this after quitting to flush the nicotine from your body.  Learn to predict your moods. Do not let a bad situation be your excuse to have a cigarette. Some situations in your life might tempt you into wanting a cigarette.  Never have "just one" cigarette. It leads to wanting another and another. Remind yourself of your decision to quit.  Change habits associated with smoking. If you smoked while driving or when feeling stressed, try other activities to replace smoking. Stand up when drinking your coffee. Brush your teeth after eating. Sit in a different chair when you read the paper. Avoid alcohol while trying to quit, and try to drink fewer caffeinated beverages. Alcohol and caffeine may urge you to smoke.  Avoid foods and drinks that can trigger  a desire to smoke, such as sugary or spicy foods and alcohol.  Ask people who smoke not to smoke around you.  Have something planned to do right after eating or having a cup of  coffee. For example, plan to take a walk or exercise.  Try a relaxation exercise to calm you down and decrease your stress. Remember, you may be tense and nervous for the first 2 weeks after you quit, but this will pass.  Find new activities to keep your hands busy. Play with a pen, coin, or rubber band. Doodle or draw things on paper.  Brush your teeth right after eating. This will help cut down on the craving for the taste of tobacco after meals. You can also try mouthwash.   Use oral substitutes in place of cigarettes. Try using lemon drops, carrots, cinnamon sticks, or chewing gum. Keep them handy so they are available when you have the urge to smoke.  When you have the urge to smoke, try deep breathing.  Designate your home as a nonsmoking area.  If you are a heavy smoker, ask your health care provider about a prescription for nicotine chewing gum. It can ease your withdrawal from nicotine.  Reward yourself. Set aside the cigarette money you save and buy yourself something nice.  Look for support from others. Join a support group or smoking cessation program. Ask someone at home or at work to help you with your plan to quit smoking.  Always ask yourself, "Do I need this cigarette or is this just a reflex?" Tell yourself, "Today, I choose not to smoke," or "I do not want to smoke." You are reminding yourself of your decision to quit.  Do not replace cigarette smoking with electronic cigarettes (commonly called e-cigarettes). The safety of e-cigarettes is unknown, and some may contain harmful chemicals.  If you relapse, do not give up! Plan ahead and think about what you will do the next time you get the urge to smoke. HOW WILL I FEEL WHEN I QUIT SMOKING? You may have symptoms of withdrawal because your body  is used to nicotine (the addictive substance in cigarettes). You may crave cigarettes, be irritable, feel very hungry, cough often, get headaches, or have difficulty concentrating. The withdrawal symptoms are only temporary. They are strongest when you first quit but will go away within 10-14 days. When withdrawal symptoms occur, stay in control. Think about your reasons for quitting. Remind yourself that these are signs that your body is healing and getting used to being without cigarettes. Remember that withdrawal symptoms are easier to treat than the major diseases that smoking can cause.  Even after the withdrawal is over, expect periodic urges to smoke. However, these cravings are generally short lived and will go away whether you smoke or not. Do not smoke! WHAT RESOURCES ARE AVAILABLE TO HELP ME QUIT SMOKING? Your health care provider can direct you to community resources or hospitals for support, which may include:  Group support.  Education.  Hypnosis.  Therapy. Document Released: 03/06/2004 Document Revised: 10/23/2013 Document Reviewed: 11/24/2012 [facility] Patient Information 2015 [place], [place]. This information is not intended to replace advice given to you by your health care provider. Make sure you discuss any questions you have with your health care provider.

## 2015-03-20 NOTE — Consult Note (Signed)
Patient ID: [name] [name] MRN: [id number], DOB/AGE: 28-May-1951   Admit date: 03/20/2015   Primary Physician: Fae Pippin Primary Cardiologist: Dr. Aundra Dubin Primary Electrophysiologist: Dr. [name]    [facility]:XNAT [name] [name] is a 64 y.o. male with past medical history of CAD (s/p CABG 04/2013: LIMA to LAD, SVG to second diagonal, SVG to ramus intermediate and obtuse marginal 2, SVG to posterior descending), ischemic cardiomyopathy (ICD implantation 55/73/2202), chronic systolic CHF (EF 54-27% in 05/2014), HTN, HLD, tobacco abuse, COPD, PAD, and who presents to [facility] ED on 03/20/2015 for chest pain.  Patient reports he has experienced chest tightness over the past 3-4 months. He was seen by his vascular specialist this morning and informed her of the chest pressure, and he was transferred here via EMS.   He reports the pressure is constant but changes in intensity throughout the day at no apparent time, being mainly located in his sternal region. He reports the pressure is not worse with exertion. The pressure does intensity at night when lying down. He denies any radiating pain, nausea, vomiting, or edema. He reports the only thing that helps with the pressure, is his nebulizer treatment which he uses 2-3 times per week.  He reports weighing anywhere from 215lbs to 220lbs as his baseline weight. Recorded weight in system today is 228lbs, but patient reports that was with all of his clothing and shoes. He was 221lbs on his home scale this morning. He reports good compliance with his medications.  While in the ED, initial troponin was negative. Has received 2 SL NTG and ASA with no change in his chest pressure. BNP normal at 71.6. Creatinine elevated to 1.37 (baseline of 1.1-1.2).  Last echocardiogram was in 05/2014 and showed reduced EF of 25-30% and global hypokinesis with inferior akinesis. Grade 2 diastolic dysfunction noted at that time as well. He underwent ICD placement in  06/2014 and has followed up with Dr. Caryl Comes. Last seen in the office in 10/2014 and doing well at that time with no symptoms of ischemia. Device recently checked on 01/29/2015.   Problem List  Past Medical History  Diagnosis Date  . COPD (chronic obstructive pulmonary disease)   . Gout   . Emphysema   . Hypertension   . Hyperlipidemia   . Tobacco abuse     30+ pack-year history  . Diabetes mellitus without complication   . Chronic systolic CHF (congestive heart failure)     a. 04/2013 Echo: EF 15-20%, diff HK, sev HK of inf and ant myocardium, dilated LA,  b. EF 20-25% with restrictive filling pattern, mod RV dysfx, mild MR (10/10/2013);  c.  Echo 12/15: EF 25-30%  . CAD (coronary artery disease)     a. 04/2013 Cath: LM 10, LAD 40p, 11m D1 50p, LCX 60p, 943m44m, OM1 50, OM2 100 L-L collats, RCA 100p;  b. CABG x 5: LIMA->LAD, VG->D2, VG->RI->OM2, VG->PDA.  . Transaminitis     a. 04/2013 Abd U/S and CT unremarkable, hepatitis panel neg -->felt to be 2/2 acute R heart failure.  . Ischemic cardiomyopathy   . PVD (peripheral vascular disease)     a. (10/2013) ABIs: RIGHT 0.63, Waveforms: monophasic;  LEFT 0.78, Waveforms: monophasic    Past Surgical History  Procedure Laterality Date  . Appendectomy  as a child  . Multiple extractions with alveoloplasty N/A 05/03/2013    Procedure: Extraction of tooth #s 3,4,5,6,8,9,27 with alveoloplast and maxillary left osseous tuberosity reduction;  Surgeon: RoLenn CalDDS;  Location: [facility] OR;  Service: Oral Surgery;  Laterality: N/A;  . Coronary artery bypass graft N/A 05/05/2013    Procedure: CORONARY ARTERY BYPASS GRAFTING (CABG) TIMES FIVE USING LEFT INTERNAL MAMMARY ARTERY AND RIGHT AND LEFT SAPHENOUS LEG VEIN HARVESTED ENDOSCOPICALLY;  Surgeon: Melrose Nakayama, MD;  Location: [place];  Service: Open Heart Surgery;  Laterality: N/A;  . Left and right heart catheterization with coronary angiogram N/A 04/28/2013    Procedure: LEFT AND RIGHT  HEART CATHETERIZATION WITH CORONARY ANGIOGRAM;  Surgeon: Burnell Blanks, MD;  Location: [facility] CATH LAB;  Service: Cardiovascular;  Laterality: N/A;  . Implantable cardioverter defibrillator implant N/A 07/06/2014    Procedure: IMPLANTABLE CARDIOVERTER DEFIBRILLATOR IMPLANT;  Surgeon: Deboraha Sprang, MD;  Location: [facility] CATH LAB;  Service: Cardiovascular;  Laterality: N/A;      Home Medications Prior to Admission medications   Medication Sig Start Date End Date Taking? Authorizing Provider  albuterol (PROVENTIL HFA;VENTOLIN HFA) 108 (90 BASE) MCG/ACT inhaler Inhale 1 puff into the lungs every 6 (six) hours as needed for wheezing or shortness of breath.   Yes Historical Provider, MD  allopurinol (ZYLOPRIM) 300 MG tablet Take 300 mg by mouth daily.   Yes Historical Provider, MD  aspirin EC 81 MG tablet TAKE ONE TABLET BY MOUTH ONCE DAILY 01/03/15  Yes Deboraha Sprang, MD  atorvastatin (LIPITOR) 20 MG tablet Take 20 mg by mouth daily.   Yes Historical Provider, MD  b complex vitamins tablet Take 1 tablet by mouth daily.   Yes Historical Provider, MD  carvedilol (COREG) 3.125 MG tablet Takes 2 tablets in am and 3 tablets in pm 01/10/15  Yes Larey Dresser, MD  colchicine (COLCRYS) 0.6 MG tablet Take 0.6 mg by mouth daily as needed (gout).   Yes Historical Provider, MD  furosemide (LASIX) 40 MG tablet TAKE 1 AND 1/2 TABLETS BY MOUTH TWICE DAILY 12/10/14  Yes Deboraha Sprang, MD  gabapentin (NEURONTIN) 300 MG capsule Take 300 mg by mouth 2 (two) times daily.   Yes Historical Provider, MD  lisinopril (PRINIVIL,ZESTRIL) 5 MG tablet TAKE ONE TABLET BY MOUTH TWICE DAILY 01/02/15  Yes Jolaine Artist, MD  lubiprostone (AMITIZA) 24 MCG capsule Take 24 mcg by mouth daily.    Yes Historical Provider, MD  omeprazole (PRILOSEC) 20 MG capsule Take 20 mg by mouth daily.   Yes Historical Provider, MD  potassium chloride SA (KLOR-CON M20) 20 MEQ tablet TAKE 1 TABLET (20 MEQ TOTAL) BY MOUTH DAILY. 11/23/14  Yes Deboraha Sprang, MD  spironolactone (ALDACTONE) 25 MG tablet TAKE ONE TABLET BY MOUTH ONCE DAILY 02/05/15  Yes Larey Dresser, MD  tiotropium (SPIRIVA) 18 MCG inhalation capsule Place 18 mcg into inhaler and inhale daily.   Yes Historical Provider, MD  traMADol (ULTRAM) 50 MG tablet Take 50 mg by mouth every 6 (six) hours as needed for moderate pain.   Yes Historical Provider, MD    Allergies Allergies  Allergen Reactions  . Penicillins Other (See Comments)    Swelling , long time ago. Doesn't remember    Past Medical History Past Medical History  Diagnosis Date  . COPD (chronic obstructive pulmonary disease)   . Gout   . Emphysema   . Hypertension   . Hyperlipidemia   . Tobacco abuse     30+ pack-year history  . Diabetes mellitus without complication   . Chronic systolic CHF (congestive heart failure)     a. 04/2013 Echo: EF 15-20%, diff HK, sev  HK of inf and ant myocardium, dilated LA,  b. EF 20-25% with restrictive filling pattern, mod RV dysfx, mild MR (10/10/2013);  c.  Echo 12/15: EF 25-30%  . CAD (coronary artery disease)     a. 04/2013 Cath: LM 10, LAD 40p, 68m D1 50p, LCX 60p, 927m67m, OM1 50, OM2 100 L-L collats, RCA 100p;  b. CABG x 5: LIMA->LAD, VG->D2, VG->RI->OM2, VG->PDA.  . Transaminitis     a. 04/2013 Abd U/S and CT unremarkable, hepatitis panel neg -->felt to be 2/2 acute R heart failure.  . Ischemic cardiomyopathy   . PVD (peripheral vascular disease)     a. (10/2013) ABIs: RIGHT 0.63, Waveforms: monophasic;  LEFT 0.78, Waveforms: monophasic     Surgical History Past Surgical History  Procedure Laterality Date  . Appendectomy  as a child  . Multiple extractions with alveoloplasty N/A 05/03/2013    Procedure: Extraction of tooth #s 3,4,5,6,8,9,27 with alveoloplast and maxillary left osseous tuberosity reduction;  Surgeon: RoLenn CalDDS;  [facility] Service: Oral Surgery;  Laterality: N/A;  . Coronary artery bypass graft N/A 05/05/2013    Procedure:  CORONARY ARTERY BYPASS GRAFTING (CABG) TIMES FIVE USING LEFT INTERNAL MAMMARY ARTERY AND RIGHT AND LEFT SAPHENOUS LEG VEIN HARVESTED ENDOSCOPICALLY;  Surgeon: StMelrose NakayamaMD;  [facility] Service: Open Heart Surgery;  Laterality: N/A;  . Left and right heart catheterization with coronary angiogram N/A 04/28/2013    Procedure: LEFT AND RIGHT HEART CATHETERIZATION WITH CORONARY ANGIOGRAM;  Surgeon: ChBurnell BlanksMD;  [facility];  Service: Cardiovascular;  Laterality: N/A;  . Implantable cardioverter defibrillator implant N/A 07/06/2014    Procedure: IMPLANTABLE CARDIOVERTER DEFIBRILLATOR IMPLANT;  Surgeon: StDeboraha SprangMD;  [facility];  Service: Cardiovascular;  Laterality: N/A;     Family History Family History  Problem Relation Age of Onset  . Diabetes Mother   . Hypertension    . Heart attack      Brothers    Social History Social History   Social History  . Marital Status: Single    Spouse Name: N/A  . Number of Children: N/A  . Years of Education: N/A   Occupational History  . Retired    Social History Main Topics  . Smoking status: Current Every Day Smoker -- 1.00 packs/day for 30 years    Types: Cigarettes  . Smokeless tobacco: Never Used     Comment: smokes 7-8 cigarettes a day  . Alcohol Use: No  . Drug Use: No  . Sexual Activity: Not on file   Other Topics Concern  . Not on file   Social History Narrative   [place]       Review of Systems General:  No chills, fever, night sweats or weight changes.  Cardiovascular:  No dyspnea on exertion, edema, orthopnea, palpitations, paroxysmal nocturnal dyspnea. Positive for chest pressure. Dermatological: No rash, lesions/masses Respiratory: No cough, dyspnea Urologic: No hematuria, dysuria Abdominal:   No nausea, vomiting, diarrhea, bright red blood per rectum, melena, or hematemesis Neurologic:  No visual changes, wkns,  changes in mental status. All other systems reviewed and are otherwise negative except as noted above.   Physical Exam: Blood pressure 116/73, pulse 71, temperature 98.5 F (36.9 C), temperature source Oral, resp. rate 21, weight 228 lb 4.8 oz (103.556 kg), SpO2 100 %.  General: Well developed, well nourished,male in no acute distress. Head: Normocephalic, atraumatic, sclera non-icteric,  no xanthomas, nares are without discharge. Dentition:  Neck: No carotid bruits. JVD not elevated.  Lungs: Respirations regular and unlabored. Wheezing noted with expiration.  Heart: Regular rate and rhythm. No S3 or S4.  No murmur, no rubs, or gallops appreciated. Pressure not reproducible with palpation. Abdomen: Soft, non-tender, non-distended with normoactive bowel sounds. No hepatomegaly. No rebound/guarding. No obvious abdominal masses. Msk:  Strength and tone appear normal for age. No joint deformities or effusions. Extremities: No clubbing or cyanosis. No edema.  Distal pedal pulses are 2+ bilaterally. Neuro: Alert and oriented X 3. Moves all extremities spontaneously. No focal deficits noted. Psych:  Responds to questions appropriately with a normal affect. Skin: No rashes or lesions noted   Labs Lab Results  Component Value Date   WBC 9.2 03/20/2015   HGB 13.6 03/20/2015   HCT 41.8 03/20/2015   MCV 88.2 03/20/2015   PLT 206 03/20/2015    Recent Labs Lab 03/20/15 1254  NA 137  K 4.3  CL 107  CO2 24  BUN 30*  CREATININE 1.37*  CALCIUM 9.3  GLUCOSE 88   Troponin (Point of Care Test)  Recent Labs  03/20/15 1303  TROPIPOC 0.01   B NATRIURETIC PEPTIDE  Date/Time Value Ref Range Status  03/20/2015 12:54 PM 71.6 0.0 - 100.0 pg/mL Final    ECG: Sinus rhythm with PVC's. RBBB (Present on previous tracings). No acute changes.  Echo: 05/2014 Study Conclusions - Left ventricle: The cavity size was normal. Wall thickness was normal. Systolic function was severely reduced. The  estimated ejection fraction was in the range of 25% to 30%. Global hypokinesis with inferior akinesis. There is a mid LV cavity false tendon from the mid-ventricular septum to the lateral wall. Doppler parameters are consistent with pseudonormal left ventricular relaxation (grade 2 diastolic dysfunction). The E/a ratio is >2. The E/e&' ratio is >15, suggesting elevated LV filling pressure. - Mitral valve: Calcified annulus. There was mild regurgitation. - Left atrium: The atrium was mildly dilated (38 ml/m2). - Right ventricle: Systolic function is reduced. - Tricuspid valve: There was mild regurgitation. - Pulmonary arteries: PA peak pressure: 35 mm Hg (S). - Inferior vena cava: The vessel was normal in size. The respirophasic diameter changes were in the normal range (= 50%), consistent with normal central venous pressure.  Impressions: - Compared to the prior echo in 09/2013, there appears to be little change.  Radiology/Studies: Dg Chest 2 View: 03/20/2015   CLINICAL DATA:  Chest pain.  EXAM: CHEST  2 VIEW  COMPARISON:  07/07/2014 chest radiograph.  FINDINGS: Median sternotomy wires, CABG clips and single lead left subclavian ICD are stable in configuration. Stable cardiomediastinal silhouette with top-normal heart size. No pneumothorax. No pleural effusion. Clear lungs, with no pulmonary edema. Mild degenerative changes in the thoracic spine.  IMPRESSION: No active disease in the chest.   Electronically Signed   By: Ilona Sorrel M.D.   On: 03/20/2015 14:49     ASSESSMENT AND PLAN  1. Atypical Chest Pain - patient has history of CAD with and is s/p CABG 04/2013: LIMA to LAD, SVG to second diagonal, SVG to ramus intermediate and obtuse marginal 2, SVG to posterior descending - has had chronic chest pressure for the past 3 months that is relieved with nebulizer treatment. Not worse with exertion. - initial troponin is negative and EKG shows no evidence of acute  changes. Old RBBB noted. TWI in leads V5 and V6 but that was noted on previous EKG in 10/2014. -  will arrange for the patient to have an outpatient Lexiscan and cardiology follow-up. - Encouraged the patient to use Spiriva daily and made sure he understood how to use it. Albuterol Nebulizer and Inhaler are for PRN use only and should not have to be used on a daily basis unless needed. - Recommend steroid inhaler to help with patient's expiratory wheezing. Would need ICS + LABA due to contraindication of ICS alone in COPD.   Signed, Erma Heritage, PA-C 03/20/2015, 4:50 PM Pager: [phone]   Patient seen and examined and history reviewed. Agree with above findings and plan. 64 yo BM seen at the request of ED for chest tightness. Patient reports he has had this chest tightness for the last 3 months. It is constant. Improved with albuterol nebulizer. No increased orthopnea, weight, or edema. Denies any real pain. Still smoking. Has Spiriva ordered but only uses when he thinks he needs it. No cough or fever. He is s/p CABG in Nov. 2014. He has an ICD in for low EF. On exam he is in NSR. Lungs reveal coarse wheezing. No JVD or edema. No gallop. Ecg is without acute change with chronic RBBB. Troponin is negative.  I think his symptoms are primarily pulmonary related. Instructed patient on appropriate use of Spiriva. Can still use albuterol for rescue. Recommend starting steroid inhaler as noted above. Needs to use daily as instructed. Counseled on smoking cessation. He can safely be DC from the ED. We will arrange a follow up Myoview study as an outpatient.   Peter Martinique, [place] 03/20/2015 5:57 PM

## 2015-03-20 NOTE — ED Provider Notes (Signed)
CSN: [id number]     Arrival date & time 03/20/15  1240 History   First MD Initiated Contact with Patient 03/20/15 1300     Chief Complaint  Patient presents with  . Chest Pain     (Consider location/radiation/quality/duration/timing/severity/associated sxs/prior Treatment) HPI Comments: 63 year old male with extensive past medical history including CAD status post CABG, ischemic cardiomyopathy, CHF, PVD, T2DM, HTN, HLD, COPD who p/w chest pain. Patient went to the vascular surgery clinic today for a routine evaluation and was sent here after he noted that he has had 3 months of chest pain. Patient states that he has intermittent episodes of central chest tightness which occur randomly but are worse with exertion and improved with rest. He occasionally has shortness of breath but denies any currently. The pain is nonradiating and is not associated with any antinausea, vomiting, or diaphoresis. Patient endorses chronic cough but no change in his cough recently. No fevers or recent illness. He does endorse cramping in his calves when he walks. Patient was given aspirin and nitroglycerin prior to transport.  Patient is a 64 y.o. male presenting with chest pain. The history is provided by the patient.  Chest Pain   Past Medical History  Diagnosis Date  . COPD (chronic obstructive pulmonary disease)   . Gout   . Emphysema   . Hypertension   . Hyperlipidemia   . Tobacco abuse     30+ pack-year history  . Diabetes mellitus without complication   . Chronic systolic CHF (congestive heart failure)     a. 04/2013 Echo: EF 15-20%, diff HK, sev HK of inf and ant myocardium, dilated LA,  b. EF 20-25% with restrictive filling pattern, mod RV dysfx, mild MR (10/10/2013);  c.  Echo 12/15: EF 25-30%  . CAD (coronary artery disease)     a. 04/2013 Cath: LM 10, LAD 40p, 37m D1 50p, LCX 60p, 935m91m, OM1 50, OM2 100 L-L collats, RCA 100p;  b. CABG x 5: LIMA->LAD, VG->D2, VG->RI->OM2, VG->PDA.  .  Transaminitis     a. 04/2013 Abd U/S and CT unremarkable, hepatitis panel neg -->felt to be 2/2 acute R heart failure.  . Ischemic cardiomyopathy   . PVD (peripheral vascular disease)     a. (10/2013) ABIs: RIGHT 0.63, Waveforms: monophasic;  LEFT 0.78, Waveforms: monophasic   Past Surgical History  Procedure Laterality Date  . Appendectomy  as a child  . Multiple extractions with alveoloplasty N/A 05/03/2013    Procedure: Extraction of tooth #s 3,4,5,6,8,9,27 with alveoloplast and maxillary left osseous tuberosity reduction;  Surgeon: RoLenn CalDDS;  [facility];  Laterality: N/A;  . Coronary artery bypass graft N/A 05/05/2013    Procedure: CORONARY ARTERY BYPASS GRAFTING (CABG) TIMES FIVE USING LEFT INTERNAL MAMMARY ARTERY AND RIGHT AND LEFT SAPHENOUS LEG VEIN HARVESTED ENDOSCOPICALLY;  Surgeon: StMelrose NakayamaMD;  [facility]: [facility];  Laterality: N/A;  . Left and right heart catheterization with coronary angiogram N/A 04/28/2013    Procedure: LEFT AND RIGHT HEART CATHETERIZATION WITH CORONARY ANGIOGRAM;  Surgeon: ChBurnell BlanksMD;  [facility];  Service: Cardiovascular;  Laterality: N/A;  . Implantable cardioverter defibrillator implant N/A 07/06/2014    Procedure: IMPLANTABLE CARDIOVERTER DEFIBRILLATOR IMPLANT;  Surgeon: StDeboraha SprangMD;  [facility];  Service: Cardiovascular;  Laterality: N/A;   Family History  Problem Relation Age of Onset  . Diabetes Mother   . Hypertension    .  Heart attack      Brothers   Social History  Substance Use Topics  . Smoking status: Current Every Day Smoker -- 1.00 packs/day for 30 years    Types: Cigarettes  . Smokeless tobacco: Never Used     Comment: smokes 7-8 cigarettes a day  . Alcohol Use: No    Review of Systems  Cardiovascular: Positive for chest pain.    10 Systems reviewed and are negative for acute change except as noted in the  HPI.   Allergies  Penicillins  Home Medications   Prior to Admission medications   Medication Sig Start Date End Date Taking? Authorizing Provider  albuterol (PROVENTIL HFA;VENTOLIN HFA) 108 (90 BASE) MCG/ACT inhaler Inhale 1 puff into the lungs every 6 (six) hours as needed for wheezing or shortness of breath.   Yes Historical Provider, MD  allopurinol (ZYLOPRIM) 300 MG tablet Take 300 mg by mouth daily.   Yes Historical Provider, MD  aspirin EC 81 MG tablet TAKE ONE TABLET BY MOUTH ONCE DAILY 01/03/15  Yes Deboraha Sprang, MD  atorvastatin (LIPITOR) 20 MG tablet Take 20 mg by mouth daily.   Yes Historical Provider, MD  b complex vitamins tablet Take 1 tablet by mouth daily.   Yes Historical Provider, MD  carvedilol (COREG) 3.125 MG tablet Takes 2 tablets in am and 3 tablets in pm 01/10/15  Yes Larey Dresser, MD  colchicine (COLCRYS) 0.6 MG tablet Take 0.6 mg by mouth daily as needed (gout).   Yes Historical Provider, MD  furosemide (LASIX) 40 MG tablet TAKE 1 AND 1/2 TABLETS BY MOUTH TWICE DAILY 12/10/14  Yes Deboraha Sprang, MD  gabapentin (NEURONTIN) 300 MG capsule Take 300 mg by mouth 2 (two) times daily.   Yes Historical Provider, MD  lisinopril (PRINIVIL,ZESTRIL) 5 MG tablet TAKE ONE TABLET BY MOUTH TWICE DAILY 01/02/15  Yes Jolaine Artist, MD  lubiprostone (AMITIZA) 24 MCG capsule Take 24 mcg by mouth daily.    Yes Historical Provider, MD  omeprazole (PRILOSEC) 20 MG capsule Take 20 mg by mouth daily.   Yes Historical Provider, MD  potassium chloride SA (KLOR-CON M20) 20 MEQ tablet TAKE 1 TABLET (20 MEQ TOTAL) BY MOUTH DAILY. 11/23/14  Yes Deboraha Sprang, MD  spironolactone (ALDACTONE) 25 MG tablet TAKE ONE TABLET BY MOUTH ONCE DAILY 02/05/15  Yes Larey Dresser, MD  tiotropium (SPIRIVA) 18 MCG inhalation capsule Place 18 mcg into inhaler and inhale daily.   Yes Historical Provider, MD  traMADol (ULTRAM) 50 MG tablet Take 50 mg by mouth every 6 (six) hours as needed for moderate pain.    Yes Historical Provider, MD   BP 116/73 mmHg  Pulse 71  Temp(Src) 98.5 F (36.9 C) (Oral)  Resp 21  Wt 228 lb 4.8 oz (103.556 kg)  SpO2 100% Physical Exam  Constitutional: He is oriented to person, place, and time. He appears well-developed and well-nourished. No distress.  HENT:  Head: Normocephalic and atraumatic.  Moist mucous membranes  Eyes: Conjunctivae are normal. Pupils are equal, round, and reactive to light.  Neck: Neck supple.  Cardiovascular: Normal rate, regular rhythm and normal heart sounds.   No murmur heard. Sternotomy scar  Pulmonary/Chest: Effort normal.  Coarse BS b/l w/ occasional expiratory wheezes  Abdominal: Soft. Bowel sounds are normal. He exhibits no distension. There is no tenderness.  Musculoskeletal: He exhibits no edema.  Neurological: He is alert and oriented to person, place, and time.  Fluent speech  Skin: Skin  is warm and dry.  Psychiatric: He has a normal mood and affect. Judgment normal.  Nursing note and vitals reviewed.   ED Course  Procedures (including critical care time) Labs Review Labs Reviewed  BASIC METABOLIC PANEL - Abnormal; Notable for the following:    BUN 30 (*)    Creatinine, Ser 1.37 (*)    GFR calc non Af Amer 53 (*)    All other components within normal limits  CBC - Abnormal; Notable for the following:    RDW 16.1 (*)    All other components within normal limits  BRAIN NATRIURETIC PEPTIDE  CBG MONITORING, ED  Randolm Idol, ED  Randolm Idol, ED    Imaging Review Dg Chest 2 View  03/20/2015   CLINICAL DATA:  Chest pain.  EXAM: CHEST  2 VIEW  COMPARISON:  07/07/2014 chest radiograph.  FINDINGS: Median sternotomy wires, CABG clips and single lead left subclavian ICD are stable in configuration. Stable cardiomediastinal silhouette with top-normal heart size. No pneumothorax. No pleural effusion. Clear lungs, with no pulmonary edema. Mild degenerative changes in the thoracic spine.  IMPRESSION: No active  disease in the chest.   Electronically Signed   By: Ilona Sorrel M.D.   On: 03/20/2015 14:49   I have personally reviewed and evaluated these lab results as part of my medical decision-making.   EKG Interpretation   Date/Time:  Wednesday March 20 2015 12:41:43 EDT Ventricular Rate:  71 PR Interval:  181 QRS Duration: 140 QT Interval:  454 QTC Calculation: 493 R Axis:   -32 Text Interpretation:  Sinus rhythm Multiple ventricular premature  complexes Right bundle branch block Consider inferior infarct Confirmed by  LITTLE MD, RACHEL ([id number]) on 03/20/2015 1:25:18 PM      MDM   Final diagnoses:  None  chest pain Shortness of breath H/o CAD s/p CABG   64 year old male with extensive cardiac history who presents with 3 months of intermittent chest tightness. He was sent from the vascular surgery clinic and received aspirin and nitroglycerin prior to transport. Patient was noted to be hypotensive after receiving nitroglycerin. On arrival, patient was awake, alert, and in no acute distress. Other than mild hypotension, other VS stable. Pt comfortable on exam w/ normal WOB. EKG on arrival shows right bundle branch block. Obtained labs including troponin and BNP and obtained chest x-ray.  Labs show negative troponin, stable Cr at 1.37, normal BNP. CXR with no acute process. With h/o CABG and PVD, I am concerned about ACS given pt's significant vascular history. He is a poor historian but he does endorse anginal symptoms of CP on exertion. I have consulted cardiology for evaluation and consideration of admission. Pt will be evaluated by cardiology in ED. I am signing out to oncoming attending who will f/u with cardiology recommendations.  Sharlett Iles, MD 03/20/15 [phone]

## 2015-03-25 ENCOUNTER — Telehealth (HOSPITAL_COMMUNITY): Payer: Self-pay | Admitting: *Deleted

## 2015-03-25 NOTE — Telephone Encounter (Signed)
Patient given detailed instructions per Myocardial Perfusion Study Information Sheet for test on 03/27/15 at 1230. Patient notified to arrive 15 minutes early and that it is imperative to arrive on time for appointment to keep from having the test rescheduled.  If you need to cancel or reschedule your appointment, please call the office within 24 hours of your appointment. Failure to do so may result in a cancellation of your appointment, and a $50 no show fee. Patient verbalized understanding. Marlene Pfluger, Ranae Palms

## 2015-03-27 ENCOUNTER — Ambulatory Visit (HOSPITAL_COMMUNITY): Payer: Medicaid Other | Attending: Cardiology

## 2015-03-27 DIAGNOSIS — I10 Essential (primary) hypertension: Secondary | ICD-10-CM | POA: Insufficient documentation

## 2015-03-27 DIAGNOSIS — I517 Cardiomegaly: Secondary | ICD-10-CM | POA: Insufficient documentation

## 2015-03-27 DIAGNOSIS — R0789 Other chest pain: Secondary | ICD-10-CM | POA: Diagnosis not present

## 2015-03-27 DIAGNOSIS — R9439 Abnormal result of other cardiovascular function study: Secondary | ICD-10-CM | POA: Insufficient documentation

## 2015-03-27 LAB — MYOCARDIAL PERFUSION IMAGING
CHL CUP NUCLEAR SDS: 5
CHL CUP NUCLEAR SSS: 15
CSEPPHR: 93 {beats}/min
LHR: 0.28
LV dias vol: 258 mL
LVSYSVOL: 208 mL
Rest HR: 67 {beats}/min
SRS: 10
TID: 1

## 2015-03-27 MED ORDER — TECHNETIUM TC 99M SESTAMIBI GENERIC - CARDIOLITE
10.8000 | Freq: Once | INTRAVENOUS | Status: AC | PRN
  Administered 2015-03-27: 11 via INTRAVENOUS

## 2015-03-27 MED ORDER — REGADENOSON 0.4 MG/5ML IV SOLN
0.4000 mg | Freq: Once | INTRAVENOUS | Status: AC
  Administered 2015-03-27: 0.4 mg via INTRAVENOUS

## 2015-03-27 MED ORDER — TECHNETIUM TC 99M SESTAMIBI GENERIC - CARDIOLITE
30.9000 | Freq: Once | INTRAVENOUS | Status: AC | PRN
  Administered 2015-03-27: 30.9 via INTRAVENOUS

## 2015-04-02 ENCOUNTER — Other Ambulatory Visit (HOSPITAL_COMMUNITY): Payer: Self-pay | Admitting: *Deleted

## 2015-04-02 MED ORDER — LISINOPRIL 5 MG PO TABS: 5.0000 mg | ORAL_TABLET | Freq: Two times a day (BID) | ORAL | Status: DC

## 2015-04-03 ENCOUNTER — Ambulatory Visit: Payer: Medicaid Other | Admitting: Sports Medicine

## 2015-04-03 ENCOUNTER — Ambulatory Visit (INDEPENDENT_AMBULATORY_CARE_PROVIDER_SITE_OTHER): Payer: Medicaid Other | Admitting: Cardiology

## 2015-04-03 ENCOUNTER — Encounter: Payer: Self-pay | Admitting: Cardiology

## 2015-04-03 VITALS — BP 90/60 | HR 77 | Ht 67.0 in | Wt 224.0 lb

## 2015-04-03 DIAGNOSIS — I10 Essential (primary) hypertension: Secondary | ICD-10-CM

## 2015-04-03 DIAGNOSIS — I4891 Unspecified atrial fibrillation: Secondary | ICD-10-CM

## 2015-04-03 DIAGNOSIS — R0789 Other chest pain: Secondary | ICD-10-CM

## 2015-04-03 DIAGNOSIS — R072 Precordial pain: Secondary | ICD-10-CM | POA: Diagnosis not present

## 2015-04-03 DIAGNOSIS — I5021 Acute systolic (congestive) heart failure: Secondary | ICD-10-CM | POA: Diagnosis not present

## 2015-04-03 LAB — CBC WITH DIFFERENTIAL/PLATELET
Basophils Absolute: 0 10*3/uL (ref 0.0–0.1)
Basophils Relative: 0 % (ref 0–1)
EOS PCT: 2 % (ref 0–5)
Eosinophils Absolute: 0.2 10*3/uL (ref 0.0–0.7)
HEMATOCRIT: 41.9 % (ref 39.0–52.0)
HEMOGLOBIN: 14.4 g/dL (ref 13.0–17.0)
LYMPHS ABS: 2.5 10*3/uL (ref 0.7–4.0)
Lymphocytes Relative: 25 % (ref 12–46)
MCH: 28.7 pg (ref 26.0–34.0)
MCHC: 34.4 g/dL (ref 30.0–36.0)
MCV: 83.6 fL (ref 78.0–100.0)
MONO ABS: 0.8 10*3/uL (ref 0.1–1.0)
MONOS PCT: 8 % (ref 3–12)
MPV: 9.6 fL (ref 8.6–12.4)
NEUTROS ABS: 6.4 10*3/uL (ref 1.7–7.7)
Neutrophils Relative %: 65 % (ref 43–77)
Platelets: 238 10*3/uL (ref 150–400)
RBC: 5.01 MIL/uL (ref 4.22–5.81)
RDW: 16.1 % — AB (ref 11.5–15.5)
WBC: 9.9 10*3/uL (ref 4.0–10.5)

## 2015-04-03 LAB — BASIC METABOLIC PANEL
BUN: 36 mg/dL — AB (ref 7–25)
CO2: 20 mmol/L (ref 20–31)
CREATININE: 1.4 mg/dL — AB (ref 0.70–1.25)
Calcium: 9.6 mg/dL (ref 8.6–10.3)
Chloride: 103 mmol/L (ref 98–110)
GLUCOSE: 149 mg/dL — AB (ref 65–99)
POTASSIUM: 3.8 mmol/L (ref 3.5–5.3)
Sodium: 135 mmol/L (ref 135–146)

## 2015-04-03 NOTE — Patient Instructions (Addendum)
Medication Instructions:  Your physician recommends that you continue on your current medications as directed. Please refer to the Current Medication list given to you today.   Labwork: TODAY:  BMET                CBC W/DIFF  Testing/Procedures: Your physician has requested that you have a cardiac catheterization. Cardiac catheterization is used to diagnose and/or treat various heart conditions. Doctors may recommend this procedure for a number of different reasons. The most common reason is to evaluate chest pain. Chest pain can be a symptom of coronary artery disease (CAD), and cardiac catheterization can show whether plaque is narrowing or blocking your heart's arteries. This procedure is also used to evaluate the valves, as well as measure the blood flow and oxygen levels in different parts of your heart. For further information please visit [email or web address]. Please follow instruction sheet, as given.    Follow-Up: Will be decided when you have the Catherization.   Any Other Special Instructions Will Be Listed Below (If Applicable).  ARRIVE AT [place], SHORT STAY, Monday 04-08-15 @ 5:30 A.M.   NO SOLID FOODS OR LIQUIDS AFTER 12 P.M. ON 04-06-16 DO NOT TAKE YOUR LASIX OR LISINOPRIL ON THE MORNING OF 04-08-15 (MORNING OF YOUR PROCEDURE)   Coronary Angiogram A coronary angiogram, also called coronary angiography, is an X-ray procedure used to look at the arteries in the heart. In this procedure, a dye (contrast dye) is injected through a long, hollow tube (catheter). The catheter is about the size of a piece of cooked spaghetti and is inserted through your groin, wrist, or arm. The dye is injected into each artery, and X-rays are then taken to show if there is a blockage in the arteries of your heart. LET [facility] CARE PROVIDER KNOW ABOUT:  Any allergies you have, including allergies to shellfish or contrast dye.   All medicines you are taking, including vitamins, herbs, eye  drops, creams, and over-the-counter medicines.   Previous problems you or members of your family have had with the use of anesthetics.   Any blood disorders you have.   Previous surgeries you have had.  History of kidney problems or failure.   Other medical conditions you have. RISKS AND COMPLICATIONS  Generally, a coronary angiogram is a safe procedure. However, problems can occur and include:  Allergic reaction to the dye.  Bleeding from the access site or other locations.  Kidney injury, especially in people with impaired kidney function.  Stroke (rare).  Heart attack (rare). BEFORE THE PROCEDURE   Do not eat or drink anything after midnight the night before the procedure or as directed by your health care provider.   HOLD YOUR LASIX AND LISINOPRIL ON THE MORNING OF YOUR PROCEDURE. PROCEDURE  You may be given a medicine to help you relax (sedative) before the procedure. This medicine is given through an intravenous (IV) access tube that is inserted into one of your veins.   The area where the catheter will be inserted will be washed and shaved. This is usually done in the groin but may be done in the fold of your arm (near your elbow) or in the wrist.   A medicine will be given to numb the area where the catheter will be inserted (local anesthetic).   The health care provider will insert the catheter into an artery. The catheter will be guided by using a special type of X-ray (fluoroscopy) of the blood vessel being examined.   A  special dye will then be injected into the catheter, and X-rays will be taken. The dye will help to show where any narrowing or blockages are located in the heart arteries.  AFTER THE PROCEDURE   If the procedure is done through the leg, you will be kept in bed lying flat for several hours. You will be instructed to not bend or cross your legs.  The insertion site will be checked frequently.   The pulse in your feet or wrist will  be checked frequently.   Additional blood tests, X-rays, and an electrocardiogram may be done.    This information is not intended to replace advice given to you by your health care provider. Make sure you discuss any questions you have with your health care provider.   Document Released: 12/13/2002 Document Revised: 06/29/2014 Document Reviewed: 10/31/2012 Elsevier Interactive Patient Education [employer].

## 2015-04-03 NOTE — Progress Notes (Signed)
04/03/2015 [name] [name]   10/17/1950  [id number]  Primary Physician [name], PA-C Primary Cardiologist: Dr. Aundra Dubin Electrophysiologist: Dr. Caryl Comes  Reason for Visit/CC: Chest Pain and abnormal NST  HPI:  [name] [name] is a 64 y.o. male with a past medical history of CAD (s/p CABG 04/2013: LIMA to LAD, SVG to second diagonal, SVG to ramus intermediate and obtuse marginal 2, SVG to posterior descending), ischemic cardiomyopathy (ICD implantation 23/30/0762), chronic systolic CHF (EF 26-33% in 05/2014), HTN, HLD, tobacco abuse, COPD and PAD who presents to clinic today for post ED follow-up.  He recently presented to [facility] emergency department on 03/20/2015 with a complaint of a 3 month history of progressive substernal chest tightness. Per ED consult note, the patient had endorsed episodes of constant substernal pressure with changes in intensity throughout the day.He denied any exacerbation with exertion. He did note that the pressure intensified at night when lying down. He denied any radiation of pain. No nausea or vomiting. He reported that the only thing that will help with this chest discomfort was use of his nebulizer. In the ED his troponin was negative x 2. BNP was normal at 71.6. He was seen by Dr. Martinique who felt that his symptoms were more consistent with a pulmonary etiology. However, he did recommend that he follow-up in the office with an outpatient nuclear stress test to risk stratify to rule out possible cardiac etiology.  His stress test, performed 03/27/2015 revealed severe global hypokinesis with moderate to severe patchy perfusion defects, somewhat worse anteriorly at stress compared to rest (SDS 5), felt to represent anterior ischemia or decreased tracer uptake due to infiltrative process. LVEF was 19% with severe global hypokinesis. This was interpreted as a high risk study. This is his first ischemic eval since his CABG in 2014.   Today in clinic, he is  currently CP free but does note that he has had several brief episodes of recurrent substernal chest tightness since being seen in the ED. He denies any significant dyspnea, syncope/ near syncope. He has been fully compliant with his medications but unfortunately has continued to smoke since his CABG. He has SL NTG that he carries with him.     Current Outpatient Prescriptions  Medication Sig Dispense Refill  . albuterol (PROVENTIL HFA;VENTOLIN HFA) 108 (90 BASE) MCG/ACT inhaler Inhale 1 puff into the lungs every 6 (six) hours as needed for wheezing or shortness of breath.    . allopurinol (ZYLOPRIM) 300 MG tablet Take 300 mg by mouth daily.    Marland Kitchen aspirin EC 81 MG tablet TAKE ONE TABLET BY MOUTH ONCE DAILY 30 tablet 6  . atorvastatin (LIPITOR) 20 MG tablet Take 20 mg by mouth daily.    Marland Kitchen b complex vitamins tablet Take 1 tablet by mouth daily.    . budesonide-formoterol (SYMBICORT) 80-4.5 MCG/ACT inhaler Inhale 2 puffs into the lungs 2 (two) times daily. 1 Inhaler 0  . carvedilol (COREG) 3.125 MG tablet Takes 2 tablets in am and 3 tablets in pm 150 tablet 6  . colchicine (COLCRYS) 0.6 MG tablet Take 0.6 mg by mouth daily as needed (gout).    . furosemide (LASIX) 40 MG tablet TAKE 1 AND 1/2 TABLETS BY MOUTH TWICE DAILY 90 tablet 3  . gabapentin (NEURONTIN) 300 MG capsule Take 300 mg by mouth 2 (two) times daily.    Marland Kitchen lisinopril (PRINIVIL,ZESTRIL) 5 MG tablet Take 1 tablet (5 mg total) by mouth 2 (two) times daily. 60 tablet 3  .  lubiprostone (AMITIZA) 24 MCG capsule Take 24 mcg by mouth daily.     Marland Kitchen omeprazole (PRILOSEC) 20 MG capsule Take 20 mg by mouth daily.    . potassium chloride SA (KLOR-CON M20) 20 MEQ tablet TAKE 1 TABLET (20 MEQ TOTAL) BY MOUTH DAILY. 30 tablet 11  . spironolactone (ALDACTONE) 25 MG tablet TAKE ONE TABLET BY MOUTH ONCE DAILY 30 tablet 3  . tiotropium (SPIRIVA) 18 MCG inhalation capsule Place 18 mcg into inhaler and inhale daily.    . traMADol (ULTRAM) 50 MG tablet Take 50  mg by mouth every 6 (six) hours as needed for moderate pain.     No current facility-administered medications for this visit.    Allergies  Allergen Reactions  . Penicillins Other (See Comments)    Swelling , long time ago. Doesn't remember    Social History   Social History  . Marital Status: Single    Spouse Name: N/A  . Number of Children: N/A  . Years of Education: N/A   Occupational History  . Retired    Social History Main Topics  . Smoking status: Current Every Day Smoker -- 1.00 packs/day for 30 years    Types: Cigarettes  . Smokeless tobacco: Never Used     Comment: smokes 7-8 cigarettes a day  . Alcohol Use: No  . Drug Use: No  . Sexual Activity: Not on file   Other Topics Concern  . Not on file   Social History Narrative   Lives in [place] with his girlfriend and her daughter.       Review of Systems: General: negative for chills, fever, night sweats or weight changes.  Cardiovascular: negative for chest pain, dyspnea on exertion, edema, orthopnea, palpitations, paroxysmal nocturnal dyspnea or shortness of breath Dermatological: negative for rash Respiratory: negative for cough or wheezing Urologic: negative for hematuria Abdominal: negative for nausea, vomiting, diarrhea, bright red blood per rectum, melena, or hematemesis Neurologic: negative for visual changes, syncope, or dizziness All other systems reviewed and are otherwise negative except as noted above.    There were no vitals taken for this visit.  General appearance: alert, cooperative and no distress Neck: no carotid bruit and no JVD Lungs: clear to auscultation bilaterally Heart: regular rate and rhythm, S1, S2 normal, no murmur, click, rub or gallop Extremities: no LEE Pulses: 2+ and symmetric Skin: warm and dry Neurologic: Grossly normal  EKG NSR with chronic RBBB. 77 bpm. No ischemia.   NST: 03/27/15 Study Highlights     The left ventricular ejection fraction is severely  decreased (<30%).  Nuclear stress EF: 19%.  There was no ST segment deviation noted during stress.  No T wave inversion was noted during stress.  Defect 1: There is a large defect of moderate severity.  Findings consistent with prior myocardial infarction with peri-infarct ischemia.  This is a high risk study.  Severe global hypokinesis with moderate to severe patchy perfusion defects, somewhat worse anteriorly at stress compared to rest (SDS 5). This may represent anterior ischemia or decreased tracer uptake due to infiltrative process. LVEF 19% with severe global hypokinesis. This is a high risk study. Clinical correlation is advised.       ASSESSMENT AND PLAN:   1. Chest Pain/Abnormal NST: still with occasional substernal chest tightness also with abnormal NST suggestive of possible anterior ischemia. Currently CP free in clinic today. EKG nonischemic. He has continued to smoke since his CABG, thus increasing his risk for disease progression.  Will plan for  L[facility] for definitive assessment. I've discussed potential risk of procedure and patient is willing to proceed.   2. CAD: s/p CABG 04/2013: LIMA to LAD, SVG to second diagonal, SVG to ramus intermediate and obtuse marginal 2, SVG to posterior descending. Abnormal nuclear study. Will proceed with definitive cath to reassess native and graft anatomy.   3. ICM/Chronic Systolic CHF: EF on echo 94/4739 was 25-30%. 19% on recent NST. He has an ICD. Euvolemic on physical exam. Continue Coreg, lisinopril, spironolactone and Lasix.   4. ICD: for primary prevention given ICM with EF <35%. Followed by Dr. Caryl Comes. Denies any ICD shocks.  5. Chronic Renal insufficiency: Baseline Scr ~1.4. Will order a BMP today to ensure renal function is stable and to see if he will need any pre-cath hydration.   6. Tobacco Abuse: smoking cessation strongly advised.   PLAN  Plan for [name] for definitive assessment given known CAD and abnormal NST.    Lyda Jester PA-C 04/03/2015 1:39 PM

## 2015-04-04 ENCOUNTER — Ambulatory Visit: Payer: Medicaid Other | Admitting: Sports Medicine

## 2015-04-08 ENCOUNTER — Ambulatory Visit (HOSPITAL_COMMUNITY)
Admission: RE | Admit: 2015-04-08 | Discharge: 2015-04-08 | Disposition: A | Discharge status: Home / Self Care | Payer: Medicaid Other | Source: Ambulatory Visit | Attending: Cardiology | Admitting: Cardiology

## 2015-04-08 ENCOUNTER — Encounter (HOSPITAL_COMMUNITY)
Admission: RE | Disposition: A | Discharge status: Home / Self Care | Payer: Self-pay | Source: Ambulatory Visit | Attending: Cardiology

## 2015-04-08 DIAGNOSIS — Z95810 Presence of automatic (implantable) cardiac defibrillator: Secondary | ICD-10-CM | POA: Insufficient documentation

## 2015-04-08 DIAGNOSIS — I739 Peripheral vascular disease, unspecified: Secondary | ICD-10-CM | POA: Diagnosis not present

## 2015-04-08 DIAGNOSIS — F17210 Nicotine dependence, cigarettes, uncomplicated: Secondary | ICD-10-CM | POA: Diagnosis not present

## 2015-04-08 DIAGNOSIS — I130 Hypertensive heart and chronic kidney disease with heart failure and stage 1 through stage 4 chronic kidney disease, or unspecified chronic kidney disease: Secondary | ICD-10-CM | POA: Insufficient documentation

## 2015-04-08 DIAGNOSIS — I255 Ischemic cardiomyopathy: Secondary | ICD-10-CM | POA: Insufficient documentation

## 2015-04-08 DIAGNOSIS — I5022 Chronic systolic (congestive) heart failure: Secondary | ICD-10-CM | POA: Diagnosis present

## 2015-04-08 DIAGNOSIS — E785 Hyperlipidemia, unspecified: Secondary | ICD-10-CM | POA: Insufficient documentation

## 2015-04-08 DIAGNOSIS — N189 Chronic kidney disease, unspecified: Secondary | ICD-10-CM | POA: Insufficient documentation

## 2015-04-08 DIAGNOSIS — J449 Chronic obstructive pulmonary disease, unspecified: Secondary | ICD-10-CM | POA: Diagnosis present

## 2015-04-08 DIAGNOSIS — R0789 Other chest pain: Secondary | ICD-10-CM | POA: Diagnosis present

## 2015-04-08 DIAGNOSIS — Z880 Allergy status to penicillin: Secondary | ICD-10-CM | POA: Insufficient documentation

## 2015-04-08 DIAGNOSIS — I10 Essential (primary) hypertension: Secondary | ICD-10-CM | POA: Diagnosis present

## 2015-04-08 DIAGNOSIS — Z951 Presence of aortocoronary bypass graft: Secondary | ICD-10-CM | POA: Diagnosis not present

## 2015-04-08 DIAGNOSIS — Z7982 Long term (current) use of aspirin: Secondary | ICD-10-CM | POA: Insufficient documentation

## 2015-04-08 DIAGNOSIS — R9439 Abnormal result of other cardiovascular function study: Secondary | ICD-10-CM | POA: Diagnosis present

## 2015-04-08 DIAGNOSIS — I2510 Atherosclerotic heart disease of native coronary artery without angina pectoris: Secondary | ICD-10-CM | POA: Diagnosis present

## 2015-04-08 DIAGNOSIS — R079 Chest pain, unspecified: Secondary | ICD-10-CM | POA: Diagnosis present

## 2015-04-08 HISTORY — PX: CARDIAC CATHETERIZATION: SHX172

## 2015-04-08 LAB — GLUCOSE, CAPILLARY: Glucose-Capillary: 110 mg/dL — ABNORMAL HIGH (ref 65–99)

## 2015-04-08 LAB — PROTIME-INR
INR: 1.04 (ref 0.00–1.49)
Prothrombin Time: 13.8 seconds (ref 11.6–15.2)

## 2015-04-08 SURGERY — LEFT HEART CATH AND CORS/GRAFTS ANGIOGRAPHY

## 2015-04-08 MED ORDER — HEPARIN (PORCINE) IN NACL 2-0.9 UNIT/ML-% IJ SOLN
INTRAMUSCULAR | Status: AC
  Filled 2015-04-08: qty 1000

## 2015-04-08 MED ORDER — SODIUM CHLORIDE 0.9 % IJ SOLN: 3.0000 mL | INTRAMUSCULAR | Status: DC | PRN

## 2015-04-08 MED ORDER — SODIUM CHLORIDE 0.9 % IV SOLN: 250.0000 mL | INTRAVENOUS | Status: DC | PRN

## 2015-04-08 MED ORDER — NITROGLYCERIN 1 MG/10 ML FOR IR/CATH LAB
INTRA_ARTERIAL | Status: AC
  Filled 2015-04-08: qty 10

## 2015-04-08 MED ORDER — SODIUM CHLORIDE 0.9 % IJ SOLN: 3.0000 mL | Freq: Two times a day (BID) | INTRAMUSCULAR | Status: DC

## 2015-04-08 MED ORDER — LIDOCAINE HCL (PF) 1 % IJ SOLN
INTRAMUSCULAR | Status: DC | PRN
  Administered 2015-04-08: 09:00:00

## 2015-04-08 MED ORDER — LIDOCAINE HCL (PF) 1 % IJ SOLN
INTRAMUSCULAR | Status: DC | PRN
  Administered 2015-04-08: 3 mL

## 2015-04-08 MED ORDER — HEPARIN SODIUM (PORCINE) 1000 UNIT/ML IJ SOLN
INTRAMUSCULAR | Status: AC
  Filled 2015-04-08: qty 1

## 2015-04-08 MED ORDER — SODIUM CHLORIDE 0.9 % WEIGHT BASED INFUSION: 1.0000 mL/kg/h | INTRAVENOUS | Status: DC

## 2015-04-08 MED ORDER — LIDOCAINE HCL (PF) 1 % IJ SOLN
INTRAMUSCULAR | Status: AC
  Filled 2015-04-08: qty 30

## 2015-04-08 MED ORDER — VERAPAMIL HCL 2.5 MG/ML IV SOLN
INTRAVENOUS | Status: AC
  Filled 2015-04-08: qty 2

## 2015-04-08 MED ORDER — MIDAZOLAM HCL 2 MG/2ML IJ SOLN
INTRAMUSCULAR | Status: DC | PRN
  Administered 2015-04-08: 1 mg via INTRAVENOUS

## 2015-04-08 MED ORDER — FENTANYL CITRATE (PF) 100 MCG/2ML IJ SOLN
INTRAMUSCULAR | Status: DC | PRN
  Administered 2015-04-08: 25 ug via INTRAVENOUS

## 2015-04-08 MED ORDER — HEPARIN SODIUM (PORCINE) 1000 UNIT/ML IJ SOLN
INTRAMUSCULAR | Status: DC | PRN
  Administered 2015-04-08: 5000 [IU] via INTRAVENOUS

## 2015-04-08 MED ORDER — FENTANYL CITRATE (PF) 100 MCG/2ML IJ SOLN
INTRAMUSCULAR | Status: AC
  Filled 2015-04-08: qty 4

## 2015-04-08 MED ORDER — SODIUM CHLORIDE 0.9 % WEIGHT BASED INFUSION
3.0000 mL/kg/h | INTRAVENOUS | Status: AC
  Administered 2015-04-08: 3 mL/kg/h via INTRAVENOUS

## 2015-04-08 MED ORDER — VERAPAMIL HCL 2.5 MG/ML IV SOLN
INTRAVENOUS | Status: DC | PRN
  Administered 2015-04-08: 08:00:00 via INTRA_ARTERIAL

## 2015-04-08 MED ORDER — MIDAZOLAM HCL 2 MG/2ML IJ SOLN
INTRAMUSCULAR | Status: AC
  Filled 2015-04-08: qty 4

## 2015-04-08 MED ORDER — IOHEXOL 350 MG/ML SOLN
INTRAVENOUS | Status: DC | PRN
  Administered 2015-04-08: 125 mL via INTRAVENOUS

## 2015-04-08 MED ORDER — ASPIRIN 81 MG PO CHEW: 81.0000 mg | CHEWABLE_TABLET | ORAL | Status: DC

## 2015-04-08 SURGICAL SUPPLY — 9 items
CATH INFINITI 5 FR LCB (CATHETERS) ×4 IMPLANT
CATH INFINITI 5FR MULTPACK ANG (CATHETERS) ×4 IMPLANT
DEVICE RAD COMP TR BAND LRG (VASCULAR PRODUCTS) ×4 IMPLANT
GLIDESHEATH SLEND SS 6F .021 (SHEATH) ×4 IMPLANT
KIT HEART LEFT (KITS) ×4 IMPLANT
PACK CARDIAC CATHETERIZATION (CUSTOM PROCEDURE TRAY) ×4 IMPLANT
TRANSDUCER W/STOPCOCK (MISCELLANEOUS) ×4 IMPLANT
TUBING CIL FLEX 10 FLL-RA (TUBING) ×4 IMPLANT
WIRE SAFE-T 1.5MM-J .035X260CM (WIRE) ×4 IMPLANT

## 2015-04-08 NOTE — Discharge Instructions (Signed)
Radial Site Care °Refer to this sheet in the next few weeks. These instructions provide you with information about caring for yourself after your procedure. Your health care provider may also give you more specific instructions. Your treatment has been planned according to current medical practices, but problems sometimes occur. Call your health care provider if you have any problems or questions after your procedure. °WHAT TO EXPECT AFTER THE PROCEDURE °After your procedure, it is typical to have the following: °· Bruising at the radial site that usually fades within 1-2 weeks. °· Blood collecting in the tissue (hematoma) that may be painful to the touch. It should usually decrease in size and tenderness within 1-2 weeks. °HOME CARE INSTRUCTIONS °· Take medicines only as directed by your health care provider. °· You may shower 24-48 hours after the procedure or as directed by your health care provider. Remove the bandage (dressing) and gently wash the site with plain soap and water. Pat the area dry with a clean towel. Do not rub the site, because this may cause bleeding. °· Do not take baths, swim, or use a hot tub until your health care provider approves. °· Check your insertion site every day for redness, swelling, or drainage. °· Do not apply powder or lotion to the site. °· Do not flex or bend the affected arm for 24 hours or as directed by your health care provider. °· Do not push or pull heavy objects with the affected arm for 24 hours or as directed by your health care provider. °· Do not lift over 10 lb (4.5 kg) for 5 days after your procedure or as directed by your health care provider. °· Ask your health care provider when it is okay to: °¨ Return to work or school. °¨ Resume usual physical activities or sports. °¨ Resume sexual activity. °· Do not drive home if you are discharged the same day as the procedure. Have someone else drive you. °· You may drive 24 hours after the procedure unless otherwise  instructed by your health care provider. °· Do not operate machinery or power tools for 24 hours after the procedure. °· If your procedure was done as an outpatient procedure, which means that you went home the same day as your procedure, a responsible adult should be with you for the first 24 hours after you arrive home. °· Keep all follow-up visits as directed by your health care provider. This is important. °SEEK MEDICAL CARE IF: °· You have a fever. °· You have chills. °· You have increased bleeding from the radial site. Hold pressure on the site. °SEEK IMMEDIATE MEDICAL CARE IF: °· You have unusual pain at the radial site. °· You have redness, warmth, or swelling at the radial site. °· You have drainage (other than a small amount of blood on the dressing) from the radial site. °· The radial site is bleeding, and the bleeding does not stop after 30 minutes of holding steady pressure on the site. °· Your arm or hand becomes pale, cool, tingly, or numb. °  °This information is not intended to replace advice given to you by your health care provider. Make sure you discuss any questions you have with your health care provider. °  °Document Released: 07/11/2010 Document Revised: 06/29/2014 Document Reviewed: 12/25/2013 °Elsevier Interactive Patient Education ©2016 [employer] ° °

## 2015-04-08 NOTE — H&P (View-Only) (Signed)
04/03/2015 [name]   [birth year]  [id number]  Primary Physician [name], PA-C Primary Cardiologist: Dr. Aundra Dubin Electrophysiologist: Dr. Caryl Comes  Reason for Visit/CC: Chest Pain and abnormal NST  HPI:  [name] [name] is a 64 y.o. male with a past medical history of CAD (s/p CABG 04/2013: LIMA to LAD, SVG to second diagonal, SVG to ramus intermediate and obtuse marginal 2, SVG to posterior descending), ischemic cardiomyopathy (ICD implantation 09/38/1829), chronic systolic CHF (EF 93-71% in 05/2014), HTN, HLD, tobacco abuse, COPD and PAD who presents to clinic today for post ED follow-up.  He recently presented to the [facility] emergency department on 03/20/2015 with a complaint of a 3 month history of progressive substernal chest tightness. Per ED consult note, the patient had endorsed episodes of constant substernal pressure with changes in intensity throughout the day.He denied any exacerbation with exertion. He did note that the pressure intensified at night when lying down. He denied any radiation of pain. No nausea or vomiting. He reported that the only thing that will help with this chest discomfort was use of his nebulizer. In the ED his troponin was negative x 2. BNP was normal at 71.6. He was seen by Dr. Martinique who felt that his symptoms were more consistent with a pulmonary etiology. However, he did recommend that he follow-up in the office with an outpatient nuclear stress test to risk stratify to rule out possible cardiac etiology.  His stress test, performed 03/27/2015 revealed severe global hypokinesis with moderate to severe patchy perfusion defects, somewhat worse anteriorly at stress compared to rest (SDS 5), felt to represent anterior ischemia or decreased tracer uptake due to infiltrative process. LVEF was 19% with severe global hypokinesis. This was interpreted as a high risk study. This is his first ischemic eval since his CABG in 2014.   Today in clinic, he is  currently CP free but does note that he has had several brief episodes of recurrent substernal chest tightness since being seen in the ED. He denies any significant dyspnea, syncope/ near syncope. He has been fully compliant with his medications but unfortunately has continued to smoke since his CABG. He has SL NTG that he carries with him.     Current Outpatient Prescriptions  Medication Sig Dispense Refill  . albuterol (PROVENTIL HFA;VENTOLIN HFA) 108 (90 BASE) MCG/ACT inhaler Inhale 1 puff into the lungs every 6 (six) hours as needed for wheezing or shortness of breath.    . allopurinol (ZYLOPRIM) 300 MG tablet Take 300 mg by mouth daily.    Marland Kitchen aspirin EC 81 MG tablet TAKE ONE TABLET BY MOUTH ONCE DAILY 30 tablet 6  . atorvastatin (LIPITOR) 20 MG tablet Take 20 mg by mouth daily.    Marland Kitchen b complex vitamins tablet Take 1 tablet by mouth daily.    . budesonide-formoterol (SYMBICORT) 80-4.5 MCG/ACT inhaler Inhale 2 puffs into the lungs 2 (two) times daily. 1 Inhaler 0  . carvedilol (COREG) 3.125 MG tablet Takes 2 tablets in am and 3 tablets in pm 150 tablet 6  . colchicine (COLCRYS) 0.6 MG tablet Take 0.6 mg by mouth daily as needed (gout).    . furosemide (LASIX) 40 MG tablet TAKE 1 AND 1/2 TABLETS BY MOUTH TWICE DAILY 90 tablet 3  . gabapentin (NEURONTIN) 300 MG capsule Take 300 mg by mouth 2 (two) times daily.    Marland Kitchen lisinopril (PRINIVIL,ZESTRIL) 5 MG tablet Take 1 tablet (5 mg total) by mouth 2 (two) times daily. 60 tablet 3  .  lubiprostone (AMITIZA) 24 MCG capsule Take 24 mcg by mouth daily.     Marland Kitchen omeprazole (PRILOSEC) 20 MG capsule Take 20 mg by mouth daily.    . potassium chloride SA (KLOR-CON M20) 20 MEQ tablet TAKE 1 TABLET (20 MEQ TOTAL) BY MOUTH DAILY. 30 tablet 11  . spironolactone (ALDACTONE) 25 MG tablet TAKE ONE TABLET BY MOUTH ONCE DAILY 30 tablet 3  . tiotropium (SPIRIVA) 18 MCG inhalation capsule Place 18 mcg into inhaler and inhale daily.    . traMADol (ULTRAM) 50 MG tablet Take 50  mg by mouth every 6 (six) hours as needed for moderate pain.     No current facility-administered medications for this visit.    Allergies  Allergen Reactions  . Penicillins Other (See Comments)    Swelling , long time ago. Doesn't remember    Social History   Social History  . Marital Status: Single    Spouse Name: N/A  . Number of Children: N/A  . Years of Education: N/A   Occupational History  . Retired    Social History Main Topics  . Smoking status: Current Every Day Smoker -- 1.00 packs/day for 30 years    Types: Cigarettes  . Smokeless tobacco: Never Used     Comment: smokes 7-8 cigarettes a day  . Alcohol Use: No  . Drug Use: No  . Sexual Activity: Not on file   Other Topics Concern  . Not on file   Social History Narrative   Lives in [place] with his girlfriend and her daughter.       Review of Systems: General: negative for chills, fever, night sweats or weight changes.  Cardiovascular: negative for chest pain, dyspnea on exertion, edema, orthopnea, palpitations, paroxysmal nocturnal dyspnea or shortness of breath Dermatological: negative for rash Respiratory: negative for cough or wheezing Urologic: negative for hematuria Abdominal: negative for nausea, vomiting, diarrhea, bright red blood per rectum, melena, or hematemesis Neurologic: negative for visual changes, syncope, or dizziness All other systems reviewed and are otherwise negative except as noted above.    There were no vitals taken for this visit.  General appearance: alert, cooperative and no distress Neck: no carotid bruit and no JVD Lungs: clear to auscultation bilaterally Heart: regular rate and rhythm, S1, S2 normal, no murmur, click, rub or gallop Extremities: no LEE Pulses: 2+ and symmetric Skin: warm and dry Neurologic: Grossly normal  EKG NSR with chronic RBBB. 77 bpm. No ischemia.   NST: 03/27/15 Study Highlights     The left ventricular ejection fraction is severely  decreased (<30%).  Nuclear stress EF: 19%.  There was no ST segment deviation noted during stress.  No T wave inversion was noted during stress.  Defect 1: There is a large defect of moderate severity.  Findings consistent with prior myocardial infarction with peri-infarct ischemia.  This is a high risk study.  Severe global hypokinesis with moderate to severe patchy perfusion defects, somewhat worse anteriorly at stress compared to rest (SDS 5). This may represent anterior ischemia or decreased tracer uptake due to infiltrative process. LVEF 19% with severe global hypokinesis. This is a high risk study. Clinical correlation is advised.       ASSESSMENT AND PLAN:   1. Chest Pain/Abnormal NST: still with occasional substernal chest tightness also with abnormal NST suggestive of possible anterior ischemia. Currently CP free in clinic today. EKG nonischemic. He has continued to smoke since his CABG, thus increasing his risk for disease progression.  Will plan for  LHC for definitive assessment. I've discussed potential risk of procedure and patient is willing to proceed.   2. CAD: s/p CABG 04/2013: LIMA to LAD, SVG to second diagonal, SVG to ramus intermediate and obtuse marginal 2, SVG to posterior descending. Abnormal nuclear study. Will proceed with definitive cath to reassess native and graft anatomy.   3. ICM/Chronic Systolic CHF: EF on echo 49/3552 was 25-30%. 19% on recent NST. He has an ICD. Euvolemic on physical exam. Continue Coreg, lisinopril, spironolactone and Lasix.   4. ICD: for primary prevention given ICM with EF <35%. Followed by Dr. Caryl Comes. Denies any ICD shocks.  5. Chronic Renal insufficiency: Baseline Scr ~1.4. Will order a BMP today to ensure renal function is stable and to see if he will need any pre-cath hydration.   6. Tobacco Abuse: smoking cessation strongly advised.   PLAN  Plan for [facility] for definitive assessment given known CAD and abnormal NST.    Lyda Jester PA-C 04/03/2015 1:39 PM

## 2015-04-08 NOTE — Interval H&P Note (Signed)
History and Physical Interval Note:  04/08/2015 7:34 AM  Dulce Sellar  has presented today for surgery, with the diagnosis of abnormal stress   The various methods of treatment have been discussed with the patient and family. After consideration of risks, benefits and other options for treatment, the patient has consented to  Procedure(s): Left Heart Cath and Coronary Angiography (N/A) as a surgical intervention .  The patient's history has been reviewed, patient examined, no change in status, stable for surgery.  I have reviewed the patient's chart and labs.  Questions were answered to the patient's satisfaction.   Cath Lab Visit (complete for each Cath Lab visit)  Clinical Evaluation Leading to the Procedure:   ACS: No.  Non-ACS:    Anginal Classification: CCS II  Anti-ischemic medical therapy: Maximal Therapy (2 or more classes of medications)  Non-Invasive Test Results: High-risk stress test findings: cardiac mortality >3%/year  Prior CABG: Previous CABG        Collier Salina [facility] 04/08/2015 7:34 AM

## 2015-04-09 ENCOUNTER — Encounter (HOSPITAL_COMMUNITY): Payer: Self-pay | Admitting: Cardiology

## 2015-04-10 ENCOUNTER — Encounter: Payer: Self-pay | Admitting: Sports Medicine

## 2015-04-10 ENCOUNTER — Ambulatory Visit (INDEPENDENT_AMBULATORY_CARE_PROVIDER_SITE_OTHER): Payer: Medicaid Other | Admitting: Sports Medicine

## 2015-04-10 DIAGNOSIS — F17200 Nicotine dependence, unspecified, uncomplicated: Secondary | ICD-10-CM

## 2015-04-10 DIAGNOSIS — E1140 Type 2 diabetes mellitus with diabetic neuropathy, unspecified: Secondary | ICD-10-CM

## 2015-04-10 DIAGNOSIS — L853 Xerosis cutis: Secondary | ICD-10-CM

## 2015-04-10 DIAGNOSIS — I739 Peripheral vascular disease, unspecified: Secondary | ICD-10-CM

## 2015-04-10 DIAGNOSIS — Z720 Tobacco use: Secondary | ICD-10-CM

## 2015-04-10 DIAGNOSIS — M79606 Pain in leg, unspecified: Secondary | ICD-10-CM

## 2015-04-10 DIAGNOSIS — B351 Tinea unguium: Secondary | ICD-10-CM | POA: Diagnosis not present

## 2015-04-10 DIAGNOSIS — M79676 Pain in unspecified toe(s): Secondary | ICD-10-CM | POA: Diagnosis not present

## 2015-04-10 NOTE — Progress Notes (Signed)
Patient ID: [name] [name], male   DOB: 04-26-1951, 64 y.o.   MRN: [id number] Subjective: [name] is a 64 y.o. male patient with history of type 2 diabetes who presents to office today complaining of long, painful nails  while ambulating in shoes, unable to trim. Patient states that the glucose reading this morning was not recorded. Diagnosed with DM >1 years ago around the same time that he had open heart surgery. Admits to Tobacco use and difficulty with COPD. Patient denies any new changes in medication or new problems. Patient denies any new cramping, numbness, burning or tingling in the legs.  Patient Active Problem List   Diagnosis Date Noted  . Chest pain 04/08/2015  . Abnormal nuclear stress test 04/08/2015  . Ischemic cardiomyopathy 07/06/2014  . Lumbago 03/13/2014  . Special screening for malignant neoplasms, colon 02/09/2014  . Normocytic anemia 02/09/2014  . Pleural effusion on left 11/28/2013  . Atherosclerosis of native arteries of extremity with intermittent claudication ([place]) 11/07/2013  . Weakness of both legs 11/07/2013  . Smoker 09/19/2013  . Numbness in right leg/ Foot 06/14/2013  . Pain in limb-Right Leg/foot 06/14/2013  . Atherosclerosis of native arteries of the extremities with ulceration(440.23) 06/14/2013  . Chronic systolic heart failure ([place]) 05/29/2013  . Atrial fibrillation ([place]) 05/29/2013  . Chest pain, mid sternal 05/19/2013  . S/P CABG x 5 05/08/2013  . Right foot ulcer ([place]) 05/04/2013  . Coronary atherosclerosis of native coronary artery 04/28/2013  . Acute systolic heart failure ([place]) 04/28/2013  . Abnormal blood chemistry 12/30/2012  . Pneumonia 05/18/2011  . Gout 05/17/2011  . Respiratory distress, acute ([place]) 05/17/2011  . Hypertension 05/17/2011  . COPD (chronic obstructive pulmonary disease) ([place]) 05/17/2011  . CHF, acute ([place]) 05/17/2011   Current Outpatient Prescriptions on File Prior to Visit  Medication Sig Dispense Refill  . albuterol  (PROVENTIL HFA;VENTOLIN HFA) 108 (90 BASE) MCG/ACT inhaler Inhale 1 puff into the lungs every 6 (six) hours as needed for wheezing or shortness of breath.    . allopurinol (ZYLOPRIM) 300 MG tablet Take 300 mg by mouth daily.    Marland Kitchen aspirin EC 81 MG tablet TAKE ONE TABLET BY MOUTH ONCE DAILY (Patient taking differently: TAKE 81 MG BY MOUTH ONCE DAILY) 30 tablet 6  . atorvastatin (LIPITOR) 20 MG tablet Take 20 mg by mouth daily.    Marland Kitchen b complex vitamins tablet Take 1 tablet by mouth daily.    . budesonide-formoterol (SYMBICORT) 80-4.5 MCG/ACT inhaler Inhale 2 puffs into the lungs 2 (two) times daily. 1 Inhaler 0  . carvedilol (COREG) 3.125 MG tablet Takes 2 tablets in am and 3 tablets in pm (Patient taking differently: Take 6.25-9.375 mg by mouth 2 (two) times daily. Take 6.25 mg by mouth in the morning and take 9.375 mg by mouth in the evening) 150 tablet 6  . colchicine (COLCRYS) 0.6 MG tablet Take 0.6 mg by mouth daily as needed (gout).    . furosemide (LASIX) 40 MG tablet TAKE 1 AND 1/2 TABLETS BY MOUTH TWICE DAILY (Patient taking differently: TAKE 60 MG BY MOUTH TWICE DAILY) 90 tablet 3  . gabapentin (NEURONTIN) 300 MG capsule Take 300 mg by mouth 2 (two) times daily.    Marland Kitchen lisinopril (PRINIVIL,ZESTRIL) 5 MG tablet Take 1 tablet (5 mg total) by mouth 2 (two) times daily. 60 tablet 3  . lubiprostone (AMITIZA) 24 MCG capsule Take 24 mcg by mouth daily.     . Olopatadine HCl (PATADAY) 0.2 % SOLN Place 1  drop into both eyes 2 (two) times daily.    Marland Kitchen omeprazole (PRILOSEC) 20 MG capsule Take 20 mg by mouth daily.    . potassium chloride SA (KLOR-CON M20) 20 MEQ tablet TAKE 1 TABLET (20 MEQ TOTAL) BY MOUTH DAILY. 30 tablet 11  . spironolactone (ALDACTONE) 25 MG tablet TAKE ONE TABLET BY MOUTH ONCE DAILY (Patient taking differently: TAKE 25 MG BY MOUTH ONCE DAILY) 30 tablet 3  . tiotropium (SPIRIVA) 18 MCG inhalation capsule Place 18 mcg into inhaler and inhale daily.    . traMADol (ULTRAM) 50 MG tablet Take  50 mg by mouth every 6 (six) hours as needed for moderate pain.     No current facility-administered medications on file prior to visit.   Allergies  Allergen Reactions  . Penicillins Other (See Comments)    Swelling , long time ago. Doesn't remember    Labs:HEMOGLOBIN A1C- No recent lab   Objective: General: Patient is awake, alert, and oriented x 3 and in no acute distress.  Integument: Skin is warm, dry and supple bilateral. Mild plantar xerosis bilateral. Nails are tender, long, thickened and dystrophic with subungual debris, consistent with onychomycosis, 1-5 bilateral. No signs of infection. No open lesions or preulcerative lesions present bilateral. Remaining integument unremarkable.  Vasculature:  Dorsalis Pedis pulse 1/4 bilateral. Posterior Tibial pulse  0/4 bilateral.  Capillary fill time <3 sec 1-5 bilateral. Scant hair growth to the level of the digits. Temperature gradient within normal limits. No varicosities present bilateral. Mild trace edema present bilateral ankles.   Neurology: The patient has diminished sensation measured with a 5.07/10g Semmes Weinstein Monofilament at all pedal sites bilateral . Vibratory sensation diminished bilateral with tuning fork. No Babinski sign present bilateral.   Musculoskeletal: No gross pedal deformities noted bilateral. Muscular strength 5/5 in all lower extremity muscular groups bilateral without pain or limitation on range of motion . No tenderness with calf compression bilateral.  Assessment and Plan: Problem List Items Addressed This Visit      Other   Pain in limb-Right Leg/foot   Smoker    Other Visit Diagnoses    Dermatophytosis of nail    -  Primary    Type 2 diabetes, controlled, with neuropathy ([place])        Peripheral vascular disease ([place])        Xerosis of skin          -Examined patient. -Discussed and educated patient on diabetic foot care, especially with  regards to the vascular, neurological and  musculoskeletal systems.  -Stressed the importance of good glycemic control and the detriment of not  controlling glucose levels in relation to the foot. -Encouraged smoking cessation and to consider Diabetic Education; attending classes offered by [facility] -Mechanically debrided all nails 1-5 bilateral using sterile nail nipper and filed with dremel without incident  -Cont. B-vitamin and Gabapentin as Rx daily for neuropathy -Recommend OTC skin emollient for dry skin daily  -Recommend continue with good supportive shoes -Answered all patient questions -Patient to return as needed or in 3 months for at risk foot care -Patient advised to call the office if any problems or questions arise in the  Meantime.  Landis Martins, DPM

## 2015-04-16 ENCOUNTER — Other Ambulatory Visit (HOSPITAL_COMMUNITY): Payer: Self-pay | Admitting: *Deleted

## 2015-04-16 ENCOUNTER — Encounter: Payer: Self-pay | Admitting: Internal Medicine

## 2015-04-29 ENCOUNTER — Other Ambulatory Visit (HOSPITAL_COMMUNITY): Payer: Self-pay | Admitting: *Deleted

## 2015-04-29 MED ORDER — FUROSEMIDE 40 MG PO TABS: ORAL_TABLET | ORAL | Status: DC

## 2015-04-30 ENCOUNTER — Ambulatory Visit (INDEPENDENT_AMBULATORY_CARE_PROVIDER_SITE_OTHER): Payer: Medicaid Other | Admitting: *Deleted

## 2015-04-30 ENCOUNTER — Telehealth: Payer: Self-pay | Admitting: Cardiology

## 2015-04-30 DIAGNOSIS — I255 Ischemic cardiomyopathy: Secondary | ICD-10-CM

## 2015-04-30 NOTE — Telephone Encounter (Signed)
Spoke with pt and reminded pt of remote transmission that is due today. Pt verbalized understanding.   

## 2015-05-01 NOTE — Progress Notes (Signed)
Remote ICD transmission.   

## 2015-05-02 LAB — CUP PACEART REMOTE DEVICE CHECK
Date Time Interrogation Session: 20161108223353
HIGH POWER IMPEDANCE MEASURED VALUE: 73 Ohm
HighPow Impedance: 73 Ohm
Implantable Lead Implant Date: 20160115
Implantable Lead Location: 753860
Implantable Lead Model: 7122
Lead Channel Setting Sensing Sensitivity: 0.5 mV
MDC IDC MSMT BATTERY REMAINING LONGEVITY: 94 mo
MDC IDC MSMT BATTERY REMAINING PERCENTAGE: 91 %
MDC IDC MSMT BATTERY VOLTAGE: 3.14 V
MDC IDC MSMT LEADCHNL RV IMPEDANCE VALUE: 450 Ohm
MDC IDC MSMT LEADCHNL RV SENSING INTR AMPL: 9.7 mV
MDC IDC SET LEADCHNL RV PACING AMPLITUDE: 2.5 V
MDC IDC SET LEADCHNL RV PACING PULSEWIDTH: 0.5 ms
MDC IDC STAT BRADY RV PERCENT PACED: 1 %
Pulse Gen Serial Number: 7233603

## 2015-05-03 ENCOUNTER — Encounter: Payer: Self-pay | Admitting: Cardiology

## 2015-05-13 ENCOUNTER — Other Ambulatory Visit: Payer: Self-pay | Admitting: Cardiology

## 2015-07-11 ENCOUNTER — Ambulatory Visit: Payer: Medicaid Other | Admitting: Sports Medicine

## 2015-07-24 ENCOUNTER — Ambulatory Visit: Payer: Medicaid Other | Admitting: Sports Medicine

## 2015-07-30 ENCOUNTER — Encounter: Payer: Medicaid Other | Admitting: Internal Medicine

## 2015-07-31 ENCOUNTER — Ambulatory Visit (INDEPENDENT_AMBULATORY_CARE_PROVIDER_SITE_OTHER): Payer: Medicaid Other | Admitting: Sports Medicine

## 2015-07-31 ENCOUNTER — Encounter: Payer: Self-pay | Admitting: Sports Medicine

## 2015-07-31 DIAGNOSIS — I739 Peripheral vascular disease, unspecified: Secondary | ICD-10-CM | POA: Diagnosis not present

## 2015-07-31 DIAGNOSIS — E1140 Type 2 diabetes mellitus with diabetic neuropathy, unspecified: Secondary | ICD-10-CM

## 2015-07-31 DIAGNOSIS — B351 Tinea unguium: Secondary | ICD-10-CM | POA: Diagnosis not present

## 2015-07-31 DIAGNOSIS — M79676 Pain in unspecified toe(s): Secondary | ICD-10-CM

## 2015-07-31 NOTE — Progress Notes (Signed)
Patient ID: [name] [name], male   DOB: March 05, 1951, 65 y.o.   MRN: [id number]  Subjective: [name] [name] is a 65 y.o. male patient with history of type 2 diabetes who returns to office today complaining of long, painful nails  while ambulating in shoes, unable to trim. Patient states that the glucose reading this morning was not recorded.Patient denies any new changes in medication or new problems. Patient denies any new cramping, numbness, burning or tingling in the legs.  Patient Active Problem List   Diagnosis Date Noted  . Chest pain 04/08/2015  . Abnormal nuclear stress test 04/08/2015  . Ischemic cardiomyopathy 07/06/2014  . Lumbago 03/13/2014  . Special screening for malignant neoplasms, colon 02/09/2014  . Normocytic anemia 02/09/2014  . Pleural effusion on left 11/28/2013  . Atherosclerosis of native arteries of extremity with intermittent claudication ([place]) 11/07/2013  . Weakness of both legs 11/07/2013  . Smoker 09/19/2013  . Numbness in right leg/ Foot 06/14/2013  . Pain in limb-Right Leg/foot 06/14/2013  . Atherosclerosis of native arteries of the extremities with ulceration(440.23) 06/14/2013  . Chronic systolic heart failure ([place]) 05/29/2013  . Atrial fibrillation ([place]) 05/29/2013  . Chest pain, mid sternal 05/19/2013  . S/P CABG x 5 05/08/2013  . Right foot ulcer ([place]) 05/04/2013  . Coronary atherosclerosis of native coronary artery 04/28/2013  . Acute systolic heart failure ([place]) 04/28/2013  . Abnormal blood chemistry 12/30/2012  . Pneumonia 05/18/2011  . Gout 05/17/2011  . Respiratory distress, acute ([place]) 05/17/2011  . Hypertension 05/17/2011  . COPD (chronic obstructive pulmonary disease) ([place]) 05/17/2011  . CHF, acute ([place]) 05/17/2011   Current Outpatient Prescriptions on File Prior to Visit  Medication Sig Dispense Refill  . albuterol (PROVENTIL HFA;VENTOLIN HFA) 108 (90 BASE) MCG/ACT inhaler Inhale 1 puff into the lungs every 6 (six) hours as needed for wheezing or  shortness of breath.    . allopurinol (ZYLOPRIM) 300 MG tablet Take 300 mg by mouth daily.    Marland Kitchen aspirin EC 81 MG tablet TAKE ONE TABLET BY MOUTH ONCE DAILY (Patient taking differently: TAKE 81 MG BY MOUTH ONCE DAILY) 30 tablet 6  . atorvastatin (LIPITOR) 20 MG tablet Take 20 mg by mouth daily.    Marland Kitchen b complex vitamins tablet Take 1 tablet by mouth daily.    . budesonide-formoterol (SYMBICORT) 80-4.5 MCG/ACT inhaler Inhale 2 puffs into the lungs 2 (two) times daily. 1 Inhaler 0  . carvedilol (COREG) 3.125 MG tablet Takes 2 tablets in am and 3 tablets in pm (Patient taking differently: Take 6.25-9.375 mg by mouth 2 (two) times daily. Take 6.25 mg by mouth in the morning and take 9.375 mg by mouth in the evening) 150 tablet 6  . colchicine (COLCRYS) 0.6 MG tablet Take 0.6 mg by mouth daily as needed (gout).    . furosemide (LASIX) 40 MG tablet TAKE 60 MG BY MOUTH TWICE DAILY 90 tablet 3  . gabapentin (NEURONTIN) 300 MG capsule Take 300 mg by mouth 2 (two) times daily.    Marland Kitchen lisinopril (PRINIVIL,ZESTRIL) 5 MG tablet Take 1 tablet (5 mg total) by mouth 2 (two) times daily. 60 tablet 3  . lubiprostone (AMITIZA) 24 MCG capsule Take 24 mcg by mouth daily.     . Olopatadine HCl (PATADAY) 0.2 % SOLN Place 1 drop into both eyes 2 (two) times daily.    Marland Kitchen omeprazole (PRILOSEC) 20 MG capsule Take 20 mg by mouth daily.    . potassium chloride SA (KLOR-CON M20) 20 MEQ tablet TAKE  1 TABLET (20 MEQ TOTAL) BY MOUTH DAILY. 30 tablet 11  . spironolactone (ALDACTONE) 25 MG tablet TAKE ONE TABLET BY MOUTH ONCE DAILY 30 tablet 3  . tiotropium (SPIRIVA) 18 MCG inhalation capsule Place 18 mcg into inhaler and inhale daily.    . traMADol (ULTRAM) 50 MG tablet Take 50 mg by mouth every 6 (six) hours as needed for moderate pain.     No current facility-administered medications on file prior to visit.   Allergies  Allergen Reactions  . Penicillins Other (See Comments)    Swelling , long time ago. Doesn't remember    Objective: General: Patient is awake, alert, and oriented x 3 and in no acute distress.  Integument: Skin is warm, dry and supple bilateral. Mild plantar xerosis bilateral. Nails are tender, long, thickened and dystrophic with subungual debris, consistent with onychomycosis, 1-5 bilateral. No signs of infection. No open lesions or preulcerative lesions present bilateral. Remaining integument unremarkable.  Vasculature:  Dorsalis Pedis pulse 1/4 bilateral. Posterior Tibial pulse  0/4 bilateral.  Capillary fill time <3 sec 1-5 bilateral. Scant hair growth to the level of the digits. Temperature gradient within normal limits. No varicosities present bilateral. Mild trace edema present bilateral ankles.   Neurology: The patient has diminished sensation measured with a 5.07/10g Semmes Weinstein Monofilament at all pedal sites bilateral . Vibratory sensation diminished bilateral with tuning fork. No Babinski sign present bilateral.   Musculoskeletal: No gross pedal deformities noted bilateral. Muscular strength 5/5 in all lower extremity muscular groups bilateral without pain or limitation on range of motion . No tenderness with calf compression bilateral.  Assessment and Plan: Problem List Items Addressed This Visit      Other   Pain in limb-Right Leg/foot    Other Visit Diagnoses    Dermatophytosis of nail    -  Primary    Type 2 diabetes, controlled, with neuropathy ([place])        Peripheral vascular disease ([place])          -Examined patient. -Discussed and educated patient on diabetic foot care, especially with  regards to the vascular, neurological and musculoskeletal systems.  -Stressed the importance of good glycemic control and the detriment of not  controlling glucose levels in relation to the foot. -Encouraged smoking cessation and to consider Diabetic Education; attending classes offered by [facility] -Mechanically debrided all nails 1-5 bilateral using sterile nail nipper  and filed with dremel without incident  -Cont. B-vitamin and Gabapentin as Rx daily for neuropathy -Recommend OTC skin emollient for dry skin daily; Okeeffe's healthy feet  -Recommend continue with good supportive shoes -Answered all patient questions -Patient to return as needed or in 3 months for at risk foot care -Patient advised to call the office if any problems or questions arise in the meantime.  Landis Martins, DPM

## 2015-08-05 ENCOUNTER — Other Ambulatory Visit (HOSPITAL_COMMUNITY): Payer: Self-pay | Admitting: *Deleted

## 2015-08-05 MED ORDER — CARVEDILOL 3.125 MG PO TABS: ORAL_TABLET | ORAL | Status: DC

## 2015-08-16 ENCOUNTER — Other Ambulatory Visit (HOSPITAL_COMMUNITY): Payer: Self-pay | Admitting: *Deleted

## 2015-08-16 MED ORDER — FUROSEMIDE 40 MG PO TABS: ORAL_TABLET | ORAL | Status: DC

## 2015-08-19 NOTE — Progress Notes (Signed)
Electrophysiology Office Note Date: 08/20/2015  ID:  [name] [name], DOB [birth year], MRN [id number]  PCP: Fae Pippin Primary Cardiologist: Aundra Dubin Electrophysiologist: Caryl Comes  CC: Routine ICD follow-up  [name] [name] is a 65 y.o. male seen today for Dr Caryl Comes.  He presents today for routine electrophysiology followup.  Since last being seen in our clinic, the patient reports doing reasonably well. He had one episode of chest tightness that was relieved with NTG but none since a few weeks ago.  He walks on a daily basis without chest pain or shortness of breath. He does have chronic chest "tightness" that is improved with inhalers. He continues to smoke. He denies palpitations, dyspnea, PND, orthopnea, nausea, vomiting, dizziness, syncope, edema, weight gain, or early satiety.  He has not had ICD shocks.   Device History: STJ single chamber ICD implanted 2016 for ischemic cardiomyopathy, primary prevention History of appropriate therapy: No History of AAD therapy: No   Past Medical History  Diagnosis Date  . COPD (chronic obstructive pulmonary disease) ([place])   . Gout   . Emphysema   . Hypertension   . Hyperlipidemia   . Tobacco abuse     30+ pack-year history  . Diabetes mellitus without complication ([place])   . Chronic systolic CHF (congestive heart failure) ([place])     a. 04/2013 Echo: EF 15-20%, diff HK, sev HK of inf and ant myocardium, dilated LA,  b. EF 20-25% with restrictive filling pattern, mod RV dysfx, mild MR (10/10/2013);  c.  Echo 12/15: EF 25-30%  . CAD (coronary artery disease)     a. 04/2013 Cath: LM 10, LAD 40p, 55m D1 50p, LCX 60p, 965m54m, OM1 50, OM2 100 L-L collats, RCA 100p;  b. CABG x 5: LIMA->LAD, VG->D2, VG->RI->OM2, VG->PDA.  . Transaminitis     a. 04/2013 Abd U/S and CT unremarkable, hepatitis panel neg -->felt to be 2/2 acute R heart failure.  . Ischemic cardiomyopathy   . PVD (peripheral vascular disease) ([place]    a. (10/2013) ABIs: RIGHT 0.63,  Waveforms: monophasic;  LEFT 0.78, Waveforms: monophasic   Past Surgical History  Procedure Laterality Date  . Appendectomy  as a child  . Multiple extractions with alveoloplasty N/A 05/03/2013    Procedure: Extraction of tooth #s 3,4,5,6,8,9,27 with alveoloplast and maxillary left osseous tuberosity reduction;  Surgeon: RoLenn CalDDS;  [facility]: Oral [facility];  Laterality: N/A;  . Coronary artery bypass graft N/A 05/05/2013    Procedure: CORONARY ARTERY BYPASS GRAFTING (CABG) TIMES FIVE USING LEFT INTERNAL MAMMARY ARTERY AND RIGHT AND LEFT SAPHENOUS LEG VEIN HARVESTED ENDOSCOPICALLY;  Surgeon: StMelrose NakayamaMD;  [facility]: Open Heart Surgery;  Laterality: N/A;  . Left and right heart catheterization with coronary angiogram N/A 04/28/2013    Procedure: LEFT AND RIGHT HEART CATHETERIZATION WITH CORONARY ANGIOGRAM;  Surgeon: ChBurnell BlanksMD;  [facility];  Service: Cardiovascular;  Laterality: N/A;  . Implantable cardioverter defibrillator implant N/A 07/06/2014    Procedure: IMPLANTABLE CARDIOVERTER DEFIBRILLATOR IMPLANT;  Surgeon: StDeboraha SprangMD;  [facility];  Service: Cardiovascular;  Laterality: N/A;  . Cardiac catheterization  04/08/2015    Procedure: Left Heart Cath and Cors/Grafts Angiography;  Surgeon: Peter M JoMartiniqueMD;  [facility];  Service: Cardiovascular;;    Current Outpatient Prescriptions  Medication Sig Dispense Refill  . albuterol (PROVENTIL HFA;VENTOLIN HFA) 108 (90 BASE) MCG/ACT inhaler Inhale 1 puff into the lungs every  6 (six) hours as needed for wheezing or shortness of breath.    . allopurinol (ZYLOPRIM) 300 MG tablet Take 300 mg by mouth daily.    Marland Kitchen aspirin EC 81 MG tablet TAKE ONE TABLET BY MOUTH ONCE DAILY 30 tablet 6  . atorvastatin (LIPITOR) 20 MG tablet Take 20 mg by mouth daily.    Marland Kitchen b complex vitamins tablet Take 1 tablet by mouth daily.    . budesonide-formoterol  (SYMBICORT) 80-4.5 MCG/ACT inhaler Inhale 2 puffs into the lungs 2 (two) times daily. 1 Inhaler 0  . carvedilol (COREG) 3.125 MG tablet TAKE 2 TABS BY MOUTH IN THE AM & 3 TABS BY MOUTH IN THE PM    . colchicine (COLCRYS) 0.6 MG tablet Take 0.6 mg by mouth daily as needed (gout).    . furosemide (LASIX) 40 MG tablet TAKE 60 MG BY MOUTH TWICE DAILY 90 tablet 3  . gabapentin (NEURONTIN) 300 MG capsule Take 300 mg by mouth 2 (two) times daily.    Marland Kitchen lisinopril (PRINIVIL,ZESTRIL) 5 MG tablet Take 1 tablet (5 mg total) by mouth 2 (two) times daily. 60 tablet 3  . lubiprostone (AMITIZA) 24 MCG capsule Take 24 mcg by mouth daily.     . Olopatadine HCl (PATADAY) 0.2 % SOLN Place 1 drop into both eyes 2 (two) times daily.    Marland Kitchen omeprazole (PRILOSEC) 20 MG capsule Take 20 mg by mouth daily.    . potassium chloride SA (KLOR-CON M20) 20 MEQ tablet TAKE 1 TABLET (20 MEQ TOTAL) BY MOUTH DAILY. 30 tablet 11  . spironolactone (ALDACTONE) 25 MG tablet TAKE ONE TABLET BY MOUTH ONCE DAILY 30 tablet 3  . tiotropium (SPIRIVA) 18 MCG inhalation capsule Place 18 mcg into inhaler and inhale daily.    . traMADol (ULTRAM) 50 MG tablet Take 50 mg by mouth every 6 (six) hours as needed for moderate pain.     No current facility-administered medications for this visit.    Allergies:   Penicillins   Social History: Social History   Social History  . Marital Status: Single    Spouse Name: N/A  . Number of Children: N/A  . Years of Education: N/A   Occupational History  . Retired    Social History Main Topics  . Smoking status: Current Every Day Smoker -- 1.00 packs/day for 30 years    Types: Cigarettes  . Smokeless tobacco: Never Used     Comment: smokes 7-8 cigarettes a day  . Alcohol Use: No  . Drug Use: No  . Sexual Activity: Not on file   Other Topics Concern  . Not on file   Social History Narrative   Lives in [place] with his girlfriend and her daughter.      Family History: Family History    Problem Relation Age of Onset  . Diabetes Mother   . Hypertension    . Heart attack      Brothers  . Stroke Neg Hx     Review of Systems: All other systems reviewed and are otherwise negative except as noted above.   Physical Exam: VS:  BP 98/70 mmHg  Pulse 78  Ht 5' 7.5" (1.715 m)  Wt 231 lb (104.781 kg)  BMI 35.62 kg/m2  SpO2 95% , BMI Body mass index is 35.62 kg/(m^2).  GEN- The patient is appears older than his stated age, alert and oriented x 3 today, strong tobacco smell HEENT: normocephalic, atraumatic; sclera clear, conjunctiva pink; hearing intact; oropharynx clear; neck supple  Lungs- Clear to ausculation bilaterally, normal work of breathing.  No wheezes, rales, rhonchi Heart- Regular rate and rhythm  GI- soft, non-tender, non-distended, bowel sounds present  Extremities- no clubbing, cyanosis, or edema  MS- no significant deformity or atrophy Skin- warm and dry, no rash or lesion; ICD pocket well healed Psych- euthymic mood, full affect Neuro- strength and sensation are intact  ICD interrogation- reviewed in detail today,  See PACEART report  EKG:  EKG is not ordered today.  Recent Labs: 03/20/2015: B Natriuretic Peptide 71.6 04/03/2015: BUN 36*; Creat 1.40*; Hemoglobin 14.4; Platelets 238; Potassium 3.8; Sodium 135   Wt Readings from Last 3 Encounters:  08/20/15 231 lb (104.781 kg)  04/08/15 220 lb (99.791 kg)  04/03/15 224 lb (101.606 kg)     Other studies Reviewed: Additional studies/ records that were reviewed today include: Dr Olin Pia office notes  Assessment and Plan:  1.  Chronic systolic dysfunction euvolemic today Stable on an appropriate medical regimen Normal ICD function See Pace Art report No changes today  2.  ICM/CAD Cath 03/2015 with patent grafts Continue medical therapy Pt advised to call back if recurrent symptoms requiring NTG  3.  HTN Stable No change required today  4.  Tobacco abuse Cessation advised  today Discussed for >3 minutes Pt's girlfriend also smokes and he does not think he can be successful with cessation unless she quits as well.   5.  VT 1 episode of non sustained PMVT on device interrogation today Self terminated No symptoms Will follow for now Shock plan reviewed with patient  Current medicines are reviewed at length with the patient today.   The patient does not have concerns regarding his medicines.  The following changes were made today:  none  Labs/ tests ordered today include: none   Disposition:   Follow up with Dr Aundra Dubin 3 months, Merlin remotes, Dr Caryl Comes 1 year      Signed, Chanetta Marshall, NP 08/20/2015 10:52 AM  [facility] [address] [place] [address] [phone] (office) [phone] (fax)

## 2015-08-20 ENCOUNTER — Encounter: Payer: Self-pay | Admitting: Internal Medicine

## 2015-08-20 ENCOUNTER — Encounter: Payer: Self-pay | Admitting: Nurse Practitioner

## 2015-08-20 ENCOUNTER — Ambulatory Visit (INDEPENDENT_AMBULATORY_CARE_PROVIDER_SITE_OTHER): Payer: Medicaid Other | Admitting: Nurse Practitioner

## 2015-08-20 VITALS — BP 98/70 | HR 78 | Ht 67.5 in | Wt 231.0 lb

## 2015-08-20 DIAGNOSIS — I255 Ischemic cardiomyopathy: Secondary | ICD-10-CM

## 2015-08-20 DIAGNOSIS — I10 Essential (primary) hypertension: Secondary | ICD-10-CM

## 2015-08-20 DIAGNOSIS — Z720 Tobacco use: Secondary | ICD-10-CM

## 2015-08-20 DIAGNOSIS — I5022 Chronic systolic (congestive) heart failure: Secondary | ICD-10-CM

## 2015-08-20 LAB — CUP PACEART INCLINIC DEVICE CHECK
Brady Statistic RV Percent Paced: 0 %
Date Time Interrogation Session: 20170228153223
HighPow Impedance: 75.375
Implantable Lead Implant Date: 20160115
Implantable Lead Location: 753860
Lead Channel Pacing Threshold Amplitude: 0.75 V
Lead Channel Pacing Threshold Amplitude: 0.75 V
Lead Channel Pacing Threshold Pulse Width: 0.5 ms
Lead Channel Setting Pacing Amplitude: 2.5 V
Lead Channel Setting Pacing Pulse Width: 0.5 ms
Lead Channel Setting Sensing Sensitivity: 0.5 mV
MDC IDC LEAD MODEL: 7122
MDC IDC MSMT BATTERY REMAINING LONGEVITY: 91.2
MDC IDC MSMT LEADCHNL RV IMPEDANCE VALUE: 450 Ohm
MDC IDC MSMT LEADCHNL RV PACING THRESHOLD PULSEWIDTH: 0.5 ms
MDC IDC MSMT LEADCHNL RV SENSING INTR AMPL: 10.7 mV
MDC IDC PG SERIAL: 7233603

## 2015-08-20 NOTE — Patient Instructions (Signed)
Medication Instructions:   Your physician recommends that you continue on your current medications as directed. Please refer to the Current Medication list given to you today.   If you need a refill on your cardiac medications before your next appointment, please call your pharmacy.  Labwork:  NONE ORDER TODAY   Testing/Procedures: NONE ORDER TODAY    Follow-Up:    DR [facility] IN 3 TO 4 MONTHS    Your physician wants you to follow-up in: [place] will receive a reminder letter in the mail two months in advance. If you don't receive a letter, please call our office to schedule the follow-up appointment.   Remote monitoring is used to monitor your Pacemaker of ICD from home. This monitoring reduces the number of office visits required to check your device to one time per year. It allows [place] to keep an eye on the functioning of your device to ensure it is working properly. You are scheduled for a device check from home on . 11/18/15.Marland KitchenMarland KitchenYou may send your transmission at any time that day. If you have a wireless device, the transmission will be sent automatically. After your physician reviews your transmission, you will receive a postcard with your next transmission date.    Any Other Special Instructions Will Be Listed Below (If Applicable).

## 2015-08-22 ENCOUNTER — Encounter: Payer: Medicaid Other | Admitting: Nurse Practitioner

## 2015-08-27 ENCOUNTER — Other Ambulatory Visit (HOSPITAL_COMMUNITY): Payer: Self-pay | Admitting: *Deleted

## 2015-08-27 MED ORDER — LISINOPRIL 5 MG PO TABS: 5.0000 mg | ORAL_TABLET | Freq: Two times a day (BID) | ORAL | Status: DC

## 2015-09-12 ENCOUNTER — Other Ambulatory Visit (HOSPITAL_COMMUNITY): Payer: Self-pay | Admitting: *Deleted

## 2015-09-12 MED ORDER — SPIRONOLACTONE 25 MG PO TABS: 25.0000 mg | ORAL_TABLET | Freq: Every day | ORAL | Status: DC

## 2015-10-30 ENCOUNTER — Ambulatory Visit: Payer: Medicaid Other | Admitting: Sports Medicine

## 2015-11-13 ENCOUNTER — Other Ambulatory Visit (HOSPITAL_COMMUNITY): Payer: Self-pay | Admitting: *Deleted

## 2015-11-13 MED ORDER — FUROSEMIDE 40 MG PO TABS: ORAL_TABLET | ORAL | Status: DC

## 2015-11-19 ENCOUNTER — Telehealth: Payer: Self-pay | Admitting: Cardiology

## 2015-11-19 ENCOUNTER — Ambulatory Visit (INDEPENDENT_AMBULATORY_CARE_PROVIDER_SITE_OTHER): Payer: Medicaid Other | Admitting: *Deleted

## 2015-11-19 DIAGNOSIS — I255 Ischemic cardiomyopathy: Secondary | ICD-10-CM | POA: Diagnosis not present

## 2015-11-19 NOTE — Telephone Encounter (Signed)
Spoke with pt and reminded pt of remote transmission that is due today. Pt verbalized understanding.   

## 2015-11-20 ENCOUNTER — Other Ambulatory Visit: Payer: Self-pay | Admitting: Internal Medicine

## 2015-11-20 NOTE — Progress Notes (Signed)
Remote ICD transmission.   

## 2015-12-05 LAB — CUP PACEART REMOTE DEVICE CHECK
Battery Remaining Longevity: 90 mo
Date Time Interrogation Session: 20170530184826
HighPow Impedance: 84 Ohm
HighPow Impedance: 84 Ohm
Implantable Lead Implant Date: 20160115
Lead Channel Impedance Value: 450 Ohm
Lead Channel Pacing Threshold Pulse Width: 0.5 ms
Lead Channel Setting Pacing Pulse Width: 0.5 ms
Lead Channel Setting Sensing Sensitivity: 0.5 mV
MDC IDC LEAD LOCATION: 753860
MDC IDC LEAD MODEL: 7122
MDC IDC MSMT BATTERY REMAINING PERCENTAGE: 87 %
MDC IDC MSMT BATTERY VOLTAGE: 3.04 V
MDC IDC MSMT LEADCHNL RV PACING THRESHOLD AMPLITUDE: 0.75 V
MDC IDC MSMT LEADCHNL RV SENSING INTR AMPL: 9.8 mV
MDC IDC SET LEADCHNL RV PACING AMPLITUDE: 2.5 V
MDC IDC STAT BRADY RV PERCENT PACED: 1 %
Pulse Gen Serial Number: 7233603

## 2015-12-10 ENCOUNTER — Encounter: Payer: Self-pay | Admitting: Cardiology

## 2015-12-13 ENCOUNTER — Encounter: Payer: Self-pay | Admitting: Cardiology

## 2015-12-13 ENCOUNTER — Ambulatory Visit (INDEPENDENT_AMBULATORY_CARE_PROVIDER_SITE_OTHER): Payer: Medicaid Other | Admitting: Cardiology

## 2015-12-13 VITALS — BP 122/66 | HR 72 | Ht 67.5 in | Wt 230.0 lb

## 2015-12-13 DIAGNOSIS — Z951 Presence of aortocoronary bypass graft: Secondary | ICD-10-CM | POA: Diagnosis not present

## 2015-12-13 DIAGNOSIS — Z720 Tobacco use: Secondary | ICD-10-CM

## 2015-12-13 DIAGNOSIS — I5022 Chronic systolic (congestive) heart failure: Secondary | ICD-10-CM

## 2015-12-13 DIAGNOSIS — Z79899 Other long term (current) drug therapy: Secondary | ICD-10-CM

## 2015-12-13 DIAGNOSIS — F17200 Nicotine dependence, unspecified, uncomplicated: Secondary | ICD-10-CM

## 2015-12-13 LAB — BASIC METABOLIC PANEL
BUN: 22 mg/dL (ref 7–25)
CO2: 25 mmol/L (ref 20–31)
Calcium: 9.1 mg/dL (ref 8.6–10.3)
Chloride: 100 mmol/L (ref 98–110)
Creat: 1.3 mg/dL — ABNORMAL HIGH (ref 0.70–1.25)
Glucose, Bld: 86 mg/dL (ref 65–99)
POTASSIUM: 4.2 mmol/L (ref 3.5–5.3)
SODIUM: 136 mmol/L (ref 135–146)

## 2015-12-13 MED ORDER — SACUBITRIL-VALSARTAN 24-26 MG PO TABS: 1.0000 | ORAL_TABLET | Freq: Two times a day (BID) | ORAL | Status: DC

## 2015-12-13 NOTE — Patient Instructions (Addendum)
Medication Instructions:  Your physician has recommended you make the following change in your medication:  1) STOP Lisinopril 2) START Entresto 24/'26mg'$  twice a day  (start this on Sunday 12/15/15)  Labwork: Today: BMET & BNP  Your physician recommends that you return for lab work in: 10 days for a BMET  Testing/Procedures: Your physician has requested that you have an echocardiogram. Echocardiography is a painless test that uses sound waves to create images of your heart. It provides your doctor with information about the size and shape of your heart and how well your heart's chambers and valves are working. This procedure takes approximately one hour. There are no restrictions for this procedure.  Follow-Up: Your physician recommends that you schedule a follow-up appointment in: 1 month with Dr. Aundra Dubin in the heart failure clinic.  If you need a refill on your cardiac medications before your next appointment, please call your pharmacy.  Thank you for choosing [facility]!!    Any Other Special Instructions Will Be Listed Below (If Applicable). Sacubitril; Valsartan oral tablet What is this medicine? SACUBITRIL; VALSARTAN (sak UE bi tril; val SAR tan) is a combination of 2 drugs used to reduce the risk of death and hospitalizations in people with long-lasting heart failure. It is usually used with other medicines to treat heart failure. This medicine may be used for other purposes; ask your health care provider or pharmacist if you have questions. What should I tell my health care provider before I take this medicine? They need to know if you have any of these conditions: -diabetes and take a medicine that contains aliskiren -kidney disease -liver disease -an unusual or allergic reaction to sacubitril; valsartan, drugs called angiotensin converting enzyme (ACE) inhibitors, angiotensin II receptor blockers (ARBs), other medicines, foods, dyes, or preservatives -pregnant or trying  to get pregnant -breast-feeding How should I use this medicine? Take this medicine by mouth with a glass of water. Follow the directions on the prescription label. You can take it with or without food. If it upsets your stomach, take it with food. Take your medicine at regular intervals. Do not take it more often than directed. Do not stop taking except on your doctor's advice. Talk to your pediatrician regarding the use of this medicine in children. Special care may be needed. Overdosage: If you think you have taken too much of this medicine contact a poison control center or emergency room at once. NOTE: This medicine is only for you. Do not share this medicine with others. What if I miss a dose? If you miss a dose, take it as soon as you can. If it is almost time for next dose, take only that dose. Do not take double or extra doses. What may interact with this medicine? Do not take this medicine with any of the following medicines: -aliskiren if you have diabetes -angiotensin-converting enzyme (ACE) inhibitors, like benazepril, captopril, enalapril, fosinopril, lisinopril, or ramipril This medicine may also interact with the following medicines: -angiotensin II receptor blockers (ARBs) like azilsartan, candesartan, eprosartan, irbesartan, losartan, olmesartan, telmisartan, or valsartan -lithium -NSAIDS, medicines for pain and inflammation, like ibuprofen or naproxen -potassium-sparing diuretics like amiloride, spironolactone, and triamterene -potassium supplements This list may not describe all possible interactions. Give your health care provider a list of all the medicines, herbs, non-prescription drugs, or dietary supplements you use. Also tell them if you smoke, drink alcohol, or use illegal drugs. Some items may interact with your medicine. What should I watch for while using this  medicine? Tell your doctor or healthcare professional if your symptoms do not start to get better or if  they get worse. Do not become pregnant while taking this medicine. Women should inform their doctor if they wish to become pregnant or think they might be pregnant. There is a potential for serious side effects to an unborn child. Talk to your health care professional or pharmacist for more information. You may get dizzy. Do not drive, use machinery, or do anything that needs mental alertness until you know how this medicine affects you. Do not stand or sit up quickly, especially if you are an older patient. This reduces the risk of dizzy or fainting spells. Avoid alcoholic drinks; they can make you more dizzy. What side effects may I notice from receiving this medicine? Side effects that you should report to your doctor or health care professional as soon as possible: -allergic reactions like skin rash, itching or hives, swelling of the face, lips, or tongue -signs and symptoms of increased potassium like muscle weakness; chest pain; or fast, irregular heartbeat -signs and symptoms of kidney injury like trouble passing urine or change in the amount of urine -signs and symptoms of low blood pressure like feeling dizzy or lightheaded, or if you develop extreme fatigue. Side effects that usually do not require medical attention (Report these to your doctor or health care professional if they continue or are bothersome.): -cough This list may not describe all possible side effects. Call your doctor for medical advice about side effects. You may report side effects to FDA at 1-800-FDA-1088. Where should I keep my medicine? Keep out of the reach of children. Store at room temperature between 15 and 30 degrees C (59 and 86 degrees F). Throw away any unused medicine after the expiration date. NOTE: This sheet is a summary. It may not cover all possible information. If you have questions about this medicine, talk to your doctor, pharmacist, or health care provider.    2016, Elsevier/Gold Standard.  (2014-02-08 13:16:47)

## 2015-12-14 LAB — BRAIN NATRIURETIC PEPTIDE: Brain Natriuretic Peptide: 113.1 pg/mL — ABNORMAL HIGH (ref <100)

## 2015-12-15 NOTE — Progress Notes (Signed)
Patient ID: [name] [name], male   DOB: Aug 30, 1950, 65 y.o.   MRN: [id number] PCP: [name] [facility]) Cardiac Surgeon: Dr Roxan Hockey Pulmonologist: Dr. Lamonte Sakai Cardiology: Dr. Aundra Dubin  HPI: [name] [name] is a 65 yo with history of COPD, HTN, 3V CAD s/p CABG x 5  (04/2013: LIMA to LAD, SVG to second diagonal, SVG to ramus intermediate and obtuse marginal 2, SVG to posterior descending), ICM, chronic systolic HF, COPD,DM2, PVD and tobacco abuse.   ECHO 06/26/13 EF 20-25% RV mild HK ECHO 10/10/13 EF 20-25% restrictive filling pattern. Moderate RV dysfunction. Mild MR. Cardiolite (10/16) with EF 19%, anterior ischemia.  LHC (10/16) with patent LIMA-LAD and 60% pLAD stenosis (competitive flow). SVG-D, sequential SVG-ramus/OM2, SVG-PDA all patent. Medical management.   St Jude ICD placed by Dr Caryl Comes.   ABIs 11/07/13 - RABI .63 moderate arterial occlusive dz,  - LABI .78 moderate arterial occlusive dz - R toe BI <.69 abnl - L toe BI < .69 abnl ABIs 9/16 - RABI .68 - LABI 0.75  Patient seen in followup today.  I have not seen him since 2015.  He continues to have atypical chest pain episodes, no change x years.  He had a cath in 10/16 with patent grafts.  He is not short of breath walking on flat ground but does get short of breath walking up hills and stairs.  This has been stable for months.  His calves tighten bilaterally after walking about 1 mile.  He has known PAD followed at [facility]. Still smoking 1/2 ppd.   Labs 09/19/13 K 4.2 Creatinine 1.1          11/02/13 K 4.0, Creatinine 1.12          12/08/13 K 3.7 Creatinine 1.01           8/15 K 4.4, creatinine 1.54, LDL 23, HDL 36          10/16 K 4.6, creatinine 1.17, hgb 14.7, LDL 42, HDL 34, TGs 294  ECG: NSR, LAFB, RBBB  SH: Lives with his girlfriend.  Smokes 1/2 ppd. Does not drink alcohol  FH: 2 brothers died from heart attacks, Mother DM, Dad HTN   ROS: All systems negative except as listed in HPI, PMH and Problem List.  Past Medical History   Diagnosis Date  . COPD (chronic obstructive pulmonary disease) ([place])   . Gout   . Emphysema   . Hypertension   . Hyperlipidemia   . Tobacco abuse     30+ pack-year history  . Diabetes mellitus without complication ([place])   . Chronic systolic CHF (congestive heart failure) ([place])     a. 04/2013 Echo: EF 15-20%, diff HK, sev HK of inf and ant myocardium, dilated LA,  b. EF 20-25% with restrictive filling pattern, mod RV dysfx, mild MR (10/10/2013);  c.  Echo 12/15: EF 25-30%  . CAD (coronary artery disease)     a. 04/2013 Cath: LM 10, LAD 40p, 2m D1 50p, LCX 60p, 961m32m, OM1 50, OM2 100 L-L collats, RCA 100p;  b. CABG x 5: LIMA->LAD, VG->D2, VG->RI->OM2, VG->PDA.  . Transaminitis     a. 04/2013 Abd U/S and CT unremarkable, hepatitis panel neg -->felt to be 2/2 acute R heart failure.  . Ischemic cardiomyopathy   . [place]VD [place]    a. (10/2013) ABIs: RIGHT 0.63, Waveforms: monophasic;  LEFT 0.78, Waveforms: monophasic    Current Outpatient Prescriptions  Medication Sig Dispense Refill  . albuterol (PROVENTIL HFA;VENTOLIN HFA) 108 (90  BASE) MCG/ACT inhaler Inhale 1 puff into the lungs every 6 (six) hours as needed for wheezing or shortness of breath.    . allopurinol (ZYLOPRIM) 300 MG tablet Take 300 mg by mouth daily.    Marland Kitchen aspirin EC 81 MG tablet TAKE ONE TABLET BY MOUTH ONCE DAILY 30 tablet 6  . atorvastatin (LIPITOR) 20 MG tablet Take 20 mg by mouth daily.    Marland Kitchen b complex vitamins tablet Take 1 tablet by mouth daily.    . budesonide-formoterol (SYMBICORT) 80-4.5 MCG/ACT inhaler Inhale 2 puffs into the lungs 2 (two) times daily. 1 Inhaler 0  . carvedilol (COREG) 3.125 MG tablet TAKE 2 TABS BY MOUTH IN THE AM & 3 TABS BY MOUTH IN THE PM    . colchicine (COLCRYS) 0.6 MG tablet Take 0.6 mg by mouth daily as needed (gout).    . furosemide (LASIX) 40 MG tablet TAKE 60 MG BY MOUTH TWICE DAILY 90 tablet 3  . gabapentin (NEURONTIN) 300 MG capsule Take 300 mg by mouth 2  (two) times daily.    Marland Kitchen lubiprostone (AMITIZA) 24 MCG capsule Take 24 mcg by mouth 2 (two) times daily with a meal.     . Olopatadine HCl (PATADAY) 0.2 % SOLN Place 1 drop into both eyes 2 (two) times daily.    Marland Kitchen omeprazole (PRILOSEC) 20 MG capsule Take 20 mg by mouth daily.    . potassium chloride SA (K-DUR,KLOR-CON) 20 MEQ tablet TAKE ONE TABLET BY MOUTH ONCE DAILY 30 tablet 8  . spironolactone (ALDACTONE) 25 MG tablet Take 1 tablet (25 mg total) by mouth daily. 30 tablet 3  . tiotropium (SPIRIVA) 18 MCG inhalation capsule Place 18 mcg into inhaler and inhale daily.    . traMADol (ULTRAM) 50 MG tablet Take 50 mg by mouth every 6 (six) hours as needed for moderate pain.    . sacubitril-valsartan (ENTRESTO) 24-26 MG Take 1 tablet by mouth 2 (two) times daily. 60 tablet 0   No current facility-administered medications for this visit.    Filed Vitals:   12/13/15 1411  BP: 122/66  Pulse: 72  Height: 5' 7.5" (1.715 m)  Weight: 230 lb (104.327 kg)    PHYSICAL EXAM: General:  Elderly. No resp difficulty. Walks with cane. HEENT: normal Neck: supple. JVP 7. Carotids 2+ bilaterally; no bruits. No lymphadenopathy or thryomegaly appreciated. Cor: PMI normal. Heart regular S1/S2, no S3/S4, 1/6 HSM LLSB.  Unable to palpate pedal pulses.  Lungs: Bilateral rhonchi.   Abdomen: soft, nontender.  Mild abdominal distention. No hepatosplenomegaly. No bruits or masses. Good bowel sounds. Extremities: no cyanosis, clubbing, rash.  Trace ankle edema.  Neuro: alert & orientedx3, cranial nerves grossly intact. Moves all 4 extremities w/o difficulty. Affect pleasant.  ASSESSMENT & PLAN:  1. CAD s/p CABG: Chronic atypical chest pain, no change to pattern.  Had cath in 10/16 with patent grafts, medical management.   - Continue ASA 81, statin.  2. Chronic systolic HF: EF 03% 4/74/25 echo.  EF was 19% by Cardiolite in 10/16.  He has a [employer], [employer] ICD.  NYHA II symptoms, does not appear volume overloaded. -  Continue Lasix 60 mg bid.  - Continue current Coreg and spironolactone. - Stop lisinopril, start Entresto 24/26 bid with BMET/BNP today and repeat BMET in 2 wks.  - Eventually should go on Bidil.   - Needs repeat echo.  3. Current Smoker:  Encouraged to stop smoking.  He has COPD which likely contributes to his dyspnea.  4. PAD:  ABIs in 9/16 with moderate bilateral arterial occlusive disease. Encouraged to quit smoking and continue medical treatment and walking program. He follows at [facility].  He is on ASA and statin.   Followup in 1 month at CHF clinic   Loralie Champagne 12/15/2015

## 2015-12-16 ENCOUNTER — Telehealth: Payer: Self-pay

## 2015-12-16 NOTE — Telephone Encounter (Signed)
Checking on Insurance for [employer].

## 2015-12-17 ENCOUNTER — Other Ambulatory Visit: Payer: Self-pay

## 2015-12-17 MED ORDER — SACUBITRIL-VALSARTAN 24-26 MG PO TABS: 1.0000 | ORAL_TABLET | Freq: Two times a day (BID) | ORAL | Status: DC

## 2015-12-23 ENCOUNTER — Other Ambulatory Visit: Payer: Self-pay

## 2015-12-25 ENCOUNTER — Other Ambulatory Visit (INDEPENDENT_AMBULATORY_CARE_PROVIDER_SITE_OTHER): Payer: Medicaid Other

## 2015-12-25 DIAGNOSIS — I5022 Chronic systolic (congestive) heart failure: Secondary | ICD-10-CM

## 2015-12-25 DIAGNOSIS — Z79899 Other long term (current) drug therapy: Secondary | ICD-10-CM

## 2015-12-25 LAB — BASIC METABOLIC PANEL
BUN: 30 mg/dL — ABNORMAL HIGH (ref 7–25)
CO2: 23 mmol/L (ref 20–31)
Calcium: 9.5 mg/dL (ref 8.6–10.3)
Chloride: 106 mmol/L (ref 98–110)
Creat: 1.35 mg/dL — ABNORMAL HIGH (ref 0.70–1.25)
Glucose, Bld: 101 mg/dL — ABNORMAL HIGH (ref 65–99)
Potassium: 4 mmol/L (ref 3.5–5.3)
SODIUM: 138 mmol/L (ref 135–146)

## 2016-01-08 ENCOUNTER — Other Ambulatory Visit (HOSPITAL_COMMUNITY): Payer: Self-pay

## 2016-01-08 ENCOUNTER — Other Ambulatory Visit: Payer: Self-pay | Admitting: Internal Medicine

## 2016-01-14 ENCOUNTER — Ambulatory Visit (HOSPITAL_COMMUNITY): Payer: Medicaid Other | Attending: Cardiology

## 2016-01-14 ENCOUNTER — Other Ambulatory Visit (HOSPITAL_COMMUNITY): Payer: Self-pay

## 2016-01-14 DIAGNOSIS — Z720 Tobacco use: Secondary | ICD-10-CM | POA: Insufficient documentation

## 2016-01-14 DIAGNOSIS — R29898 Other symptoms and signs involving the musculoskeletal system: Secondary | ICD-10-CM | POA: Diagnosis not present

## 2016-01-14 DIAGNOSIS — I340 Nonrheumatic mitral (valve) insufficiency: Secondary | ICD-10-CM | POA: Diagnosis not present

## 2016-01-14 DIAGNOSIS — E119 Type 2 diabetes mellitus without complications: Secondary | ICD-10-CM | POA: Insufficient documentation

## 2016-01-14 DIAGNOSIS — I5022 Chronic systolic (congestive) heart failure: Secondary | ICD-10-CM | POA: Insufficient documentation

## 2016-01-14 DIAGNOSIS — J449 Chronic obstructive pulmonary disease, unspecified: Secondary | ICD-10-CM | POA: Insufficient documentation

## 2016-01-14 DIAGNOSIS — I110 Hypertensive heart disease with heart failure: Secondary | ICD-10-CM | POA: Diagnosis not present

## 2016-01-14 DIAGNOSIS — I509 Heart failure, unspecified: Secondary | ICD-10-CM | POA: Diagnosis present

## 2016-01-15 ENCOUNTER — Encounter (HOSPITAL_COMMUNITY): Payer: Self-pay

## 2016-01-31 ENCOUNTER — Telehealth: Payer: Self-pay

## 2016-01-31 NOTE — Telephone Encounter (Signed)
Prior auth obtained from [employer] for [employer] 24-26. PA # [id number] and is good through 01/25/2017. Local pharmacy notified.

## 2016-02-04 ENCOUNTER — Other Ambulatory Visit (HOSPITAL_COMMUNITY): Payer: Self-pay | Admitting: *Deleted

## 2016-02-04 MED ORDER — SACUBITRIL-VALSARTAN 24-26 MG PO TABS
1.0000 | ORAL_TABLET | Freq: Two times a day (BID) | ORAL | 6 refills | Status: DC
Start: 2016-02-04 — End: 2016-10-29

## 2016-02-06 ENCOUNTER — Encounter: Payer: Self-pay | Admitting: Sports Medicine

## 2016-02-06 ENCOUNTER — Ambulatory Visit (INDEPENDENT_AMBULATORY_CARE_PROVIDER_SITE_OTHER): Payer: Medicaid Other | Admitting: Sports Medicine

## 2016-02-06 DIAGNOSIS — M79676 Pain in unspecified toe(s): Secondary | ICD-10-CM

## 2016-02-06 DIAGNOSIS — B351 Tinea unguium: Secondary | ICD-10-CM

## 2016-02-06 DIAGNOSIS — E1140 Type 2 diabetes mellitus with diabetic neuropathy, unspecified: Secondary | ICD-10-CM

## 2016-02-06 DIAGNOSIS — I739 Peripheral vascular disease, unspecified: Secondary | ICD-10-CM

## 2016-02-06 NOTE — Progress Notes (Signed)
Patient ID: [name] [name], male   DOB: February 22, 1951, 65 y.o.   MRN: [id number]  Subjective: [name] [name] is a 65 y.o. male patient with history of type 2 diabetes who returns to office today complaining of long, painful nails  while ambulating in shoes, unable to trim. Patient states that the glucose reading this morning was not recorded, meter is out of battery.Patient denies any new changes in medication or new problems. Patient denies any new cramping, numbness, burning or tingling in the legs.  Patient Active Problem List   Diagnosis Date Noted  . Chest pain 04/08/2015  . Abnormal nuclear stress test 04/08/2015  . Ischemic cardiomyopathy 07/06/2014  . Lumbago 03/13/2014  . Special screening for malignant neoplasms, colon 02/09/2014  . Normocytic anemia 02/09/2014  . Pleural effusion on left 11/28/2013  . Atherosclerosis of native arteries of extremity with intermittent claudication ([place]) 11/07/2013  . Weakness of both legs 11/07/2013  . Smoker 09/19/2013  . Numbness in right leg/ Foot 06/14/2013  . Pain in limb-Right Leg/foot 06/14/2013  . Atherosclerosis of native arteries of the extremities with ulceration(440.23) 06/14/2013  . Chronic systolic heart failure ([place]) 05/29/2013  . Atrial fibrillation ([place]) 05/29/2013  . Chest pain, mid sternal 05/19/2013  . S/P CABG x 5 05/08/2013  . Right foot ulcer ([place]) 05/04/2013  . Coronary atherosclerosis of native coronary artery 04/28/2013  . Acute systolic heart failure ([place]) 04/28/2013  . Abnormal blood chemistry 12/30/2012  . Abnormal LFTs 12/30/2012  . Aspiration pneumonia ([place]) 12/30/2012  . Chronic congestive heart failure ([place]) 12/27/2012  . Leg weakness 12/26/2012  . Pneumonia 05/18/2011  . Gout 05/17/2011  . Respiratory distress, acute ([place]) 05/17/2011  . Hypertension 05/17/2011  . COPD (chronic obstructive pulmonary disease) ([place]) 05/17/2011  . CHF, acute ([place]) 05/17/2011   Current Outpatient Prescriptions on File Prior to Visit   Medication Sig Dispense Refill  . albuterol (PROVENTIL HFA;VENTOLIN HFA) 108 (90 BASE) MCG/ACT inhaler Inhale 1 puff into the lungs every 6 (six) hours as needed for wheezing or shortness of breath.    . allopurinol (ZYLOPRIM) 300 MG tablet Take 300 mg by mouth daily.    Marland Kitchen aspirin EC 81 MG tablet TAKE ONE TABLET BY MOUTH ONCE DAILY 30 tablet 6  . atorvastatin (LIPITOR) 20 MG tablet Take 20 mg by mouth daily.    Marland Kitchen b complex vitamins tablet Take 1 tablet by mouth daily.    . budesonide-formoterol (SYMBICORT) 80-4.5 MCG/ACT inhaler Inhale 2 puffs into the lungs 2 (two) times daily. 1 Inhaler 0  . carvedilol (COREG) 3.125 MG tablet TAKE 2 TABS BY MOUTH IN THE AM & 3 TABS BY MOUTH IN THE PM    . colchicine (COLCRYS) 0.6 MG tablet Take 0.6 mg by mouth daily as needed (gout).    . furosemide (LASIX) 40 MG tablet TAKE 60 MG BY MOUTH TWICE DAILY 90 tablet 3  . gabapentin (NEURONTIN) 300 MG capsule Take 300 mg by mouth 2 (two) times daily.    Marland Kitchen lubiprostone (AMITIZA) 24 MCG capsule Take 24 mcg by mouth 2 (two) times daily with a meal.     . Olopatadine HCl (PATADAY) 0.2 % SOLN Place 1 drop into both eyes 2 (two) times daily.    Marland Kitchen omeprazole (PRILOSEC) 20 MG capsule Take 20 mg by mouth daily.    . potassium chloride SA (K-DUR,KLOR-CON) 20 MEQ tablet TAKE ONE TABLET BY MOUTH ONCE DAILY 30 tablet 8  . sacubitril-valsartan (ENTRESTO) 24-26 MG Take 1 tablet by mouth 2 (two)  times daily. 60 tablet 6  . spironolactone (ALDACTONE) 25 MG tablet Take 1 tablet (25 mg total) by mouth daily. 30 tablet 3  . tiotropium (SPIRIVA) 18 MCG inhalation capsule Place 18 mcg into inhaler and inhale daily.    . traMADol (ULTRAM) 50 MG tablet Take 50 mg by mouth every 6 (six) hours as needed for moderate pain.     No current facility-administered medications on file prior to visit.    Allergies  Allergen Reactions  . Penicillins Other (See Comments) and Nausea And Vomiting    Per Dr Audria Nine Via phone 12/27/12  0400 Swelling , long time ago. Doesn't remember   Objective: General: Patient is awake, alert, and oriented x 3 and in no acute distress.  Integument: Skin is warm, dry and supple bilateral. Moderate plantar xerosis bilateral. Nails are tender, long, thickened and dystrophic with subungual debris, consistent with onychomycosis, 1-5 bilateral. No signs of infection. No open lesions or preulcerative lesions present bilateral. Remaining integument unremarkable.  Vasculature:  Dorsalis Pedis pulse 1/4 bilateral. Posterior Tibial pulse  0/4 bilateral.  Capillary fill time <3 sec 1-5 bilateral. Scant hair growth to the level of the digits. Temperature gradient within normal limits. No varicosities present bilateral. Mild trace edema present bilateral ankles.   Neurology: The patient has diminished sensation measured with a 5.07/10g Semmes Weinstein Monofilament at all pedal sites bilateral . Vibratory sensation diminished bilateral with tuning fork. No Babinski sign present bilateral.   Musculoskeletal: No gross pedal deformities noted bilateral. Muscular strength 5/5 in all lower extremity muscular groups bilateral without pain or limitation on range of motion . No tenderness with calf compression bilateral.  Assessment and Plan: Problem List Items Addressed This Visit      Other   Pain in limb-Right Leg/foot    Other Visit Diagnoses    Dermatophytosis of nail    -  Primary   Type 2 diabetes, controlled, with neuropathy ([place])       Peripheral vascular disease ([place])         -Examined patient. -Discussed and educated patient on diabetic foot care, especially with regards to the vascular, neurological and musculoskeletal systems.  -Stressed the importance of good glycemic control and the detriment of not  controlling glucose levels in relation to the foot. -Encouraged smoking cessation and to consider Diabetic Education; attending classes offered by [facility]; will think about  it -Mechanically debrided all nails 1-5 bilateral using sterile nail nipper and filed with dremel without incident  -Cont. B-vitamin and Gabapentin for neuropathy -Recommend OTC skin emollient for dry skin daily; [place]'s healthy feet  -Recommend continue with good supportive shoes -Answered all patient questions -Patient to return as needed or in 3 months for at risk foot care -Patient advised to call the office if any problems or questions arise in the meantime.  Landis Martins, DPM

## 2016-02-18 ENCOUNTER — Ambulatory Visit (INDEPENDENT_AMBULATORY_CARE_PROVIDER_SITE_OTHER): Payer: Medicaid Other | Admitting: *Deleted

## 2016-02-18 ENCOUNTER — Telehealth: Payer: Self-pay | Admitting: Cardiology

## 2016-02-18 DIAGNOSIS — I255 Ischemic cardiomyopathy: Secondary | ICD-10-CM | POA: Diagnosis not present

## 2016-02-18 NOTE — Telephone Encounter (Signed)
Spoke with pt and reminded pt of remote transmission that is due today. Pt verbalized understanding.   

## 2016-02-19 NOTE — Progress Notes (Signed)
Remote ICD transmission.   

## 2016-02-20 ENCOUNTER — Encounter: Payer: Self-pay | Admitting: Cardiology

## 2016-03-10 LAB — CUP PACEART REMOTE DEVICE CHECK
Battery Remaining Longevity: 86 mo
Brady Statistic RV Percent Paced: 1 %
HighPow Impedance: 79 Ohm
HighPow Impedance: 79 Ohm
Implantable Lead Location: 753860
Lead Channel Setting Pacing Amplitude: 2.5 V
Lead Channel Setting Sensing Sensitivity: 0.5 mV
MDC IDC LEAD IMPLANT DT: 20160115
MDC IDC LEAD MODEL: 7122
MDC IDC MSMT BATTERY REMAINING PERCENTAGE: 85 %
MDC IDC MSMT BATTERY VOLTAGE: 3.02 V
MDC IDC MSMT LEADCHNL RV IMPEDANCE VALUE: 450 Ohm
MDC IDC MSMT LEADCHNL RV PACING THRESHOLD AMPLITUDE: 0.75 V
MDC IDC MSMT LEADCHNL RV PACING THRESHOLD PULSEWIDTH: 0.5 ms
MDC IDC MSMT LEADCHNL RV SENSING INTR AMPL: 9.7 mV
MDC IDC SESS DTM: 20170829143705
MDC IDC SET LEADCHNL RV PACING PULSEWIDTH: 0.5 ms
Pulse Gen Serial Number: 7233603

## 2016-03-11 ENCOUNTER — Other Ambulatory Visit: Payer: Self-pay | Admitting: Internal Medicine

## 2016-03-18 ENCOUNTER — Encounter: Payer: Self-pay | Admitting: Family

## 2016-03-25 ENCOUNTER — Encounter (HOSPITAL_COMMUNITY): Payer: Self-pay

## 2016-03-25 ENCOUNTER — Ambulatory Visit: Payer: Self-pay | Admitting: Family

## 2016-03-31 ENCOUNTER — Encounter: Payer: Self-pay | Admitting: Family

## 2016-04-03 ENCOUNTER — Encounter: Payer: Self-pay | Admitting: Family

## 2016-04-03 ENCOUNTER — Ambulatory Visit (INDEPENDENT_AMBULATORY_CARE_PROVIDER_SITE_OTHER): Payer: Medicaid Other | Admitting: Family

## 2016-04-03 ENCOUNTER — Ambulatory Visit (HOSPITAL_COMMUNITY)
Admission: RE | Admit: 2016-04-03 | Discharge: 2016-04-03 | Disposition: A | Discharge status: Home / Self Care | Payer: Medicaid Other | Source: Ambulatory Visit | Attending: Family | Admitting: Family

## 2016-04-03 VITALS — BP 116/77 | HR 74 | Temp 97.1°F | Resp 22 | Ht 67.5 in | Wt 228.0 lb

## 2016-04-03 DIAGNOSIS — I10 Essential (primary) hypertension: Secondary | ICD-10-CM | POA: Diagnosis not present

## 2016-04-03 DIAGNOSIS — I70213 Atherosclerosis of native arteries of extremities with intermittent claudication, bilateral legs: Secondary | ICD-10-CM | POA: Diagnosis not present

## 2016-04-03 DIAGNOSIS — I70219 Atherosclerosis of native arteries of extremities with intermittent claudication, unspecified extremity: Secondary | ICD-10-CM | POA: Insufficient documentation

## 2016-04-03 DIAGNOSIS — R0989 Other specified symptoms and signs involving the circulatory and respiratory systems: Secondary | ICD-10-CM | POA: Diagnosis present

## 2016-04-03 DIAGNOSIS — R938 Abnormal findings on diagnostic imaging of other specified body structures: Secondary | ICD-10-CM | POA: Diagnosis not present

## 2016-04-03 DIAGNOSIS — E119 Type 2 diabetes mellitus without complications: Secondary | ICD-10-CM | POA: Insufficient documentation

## 2016-04-03 DIAGNOSIS — F17200 Nicotine dependence, unspecified, uncomplicated: Secondary | ICD-10-CM | POA: Diagnosis not present

## 2016-04-03 NOTE — Patient Instructions (Signed)
Peripheral Vascular Disease Peripheral vascular disease (PVD) is a disease of the blood vessels that are not part of your heart and brain. A simple term for PVD is poor circulation. In most cases, PVD narrows the blood vessels that carry blood from your heart to the rest of your body. This can result in a decreased supply of blood to your arms, legs, and internal organs, like your stomach or kidneys. However, it most often affects a person's lower legs and feet. There are two types of PVD.  Organic PVD. This is the more common type. It is caused by damage to the structure of blood vessels.  Functional PVD. This is caused by conditions that make blood vessels contract and tighten (spasm). Without treatment, PVD tends to get worse over time. PVD can also lead to acute ischemic limb. This is when an arm or limb suddenly has trouble getting enough blood. This is a medical emergency. CAUSES Each type of PVD has many different causes. The most common cause of PVD is buildup of a fatty material (plaque) inside of your arteries (atherosclerosis). Small amounts of plaque can break off from the walls of the blood vessels and become lodged in a smaller artery. This blocks blood flow and can cause acute ischemic limb. Other common causes of PVD include:  Blood clots that form inside of blood vessels.  Injuries to blood vessels.  Diseases that cause inflammation of blood vessels or cause blood vessel spasms.  Health behaviors and health history that increase your risk of developing PVD. RISK FACTORS  You may have a greater risk of PVD if you:  Have a family history of PVD.  Have certain medical conditions, including:  High cholesterol.  Diabetes.  High blood pressure (hypertension).  Coronary heart disease.  Past problems with blood clots.  Past injury, such as burns or a broken bone. These may have damaged blood vessels in your limbs.  Buerger disease. This is caused by inflamed blood  vessels in your hands and feet.  Some forms of arthritis.  Rare birth defects that affect the arteries in your legs.  Use tobacco.  Do not get enough exercise.  Are obese.  Are age 50 or older. SIGNS AND SYMPTOMS  PVD may cause many different symptoms. Your symptoms depend on what part of your body is not getting enough blood. Some common signs and symptoms include:  Cramps in your lower legs. This may be a symptom of poor leg circulation (claudication).  Pain and weakness in your legs while you are physically active that goes away when you rest (intermittent claudication).  Leg pain when at rest.  Leg numbness, tingling, or weakness.  Coldness in a leg or foot, especially when compared with the other leg.  Skin or hair changes. These can include:  Hair loss.  Shiny skin.  Pale or bluish skin.  Thick toenails.  Inability to get or maintain an erection (erectile dysfunction). People with PVD are more prone to developing ulcers and sores on their toes, feet, or legs. These may take longer than normal to heal. DIAGNOSIS Your health care provider may diagnose PVD from your signs and symptoms. The health care provider will also do a physical exam. You may have tests to find out what is causing your PVD and determine its severity. Tests may include:  Blood pressure recordings from your arms and legs and measurements of the strength of your pulses (pulse volume recordings).  Imaging studies using sound waves to take pictures of   the blood flow through your blood vessels (Doppler ultrasound).  Injecting a dye into your blood vessels before having imaging studies using:  X-rays (angiogram or arteriogram).  Computer-generated X-rays (CT angiogram).  A powerful electromagnetic field and a computer (magnetic resonance angiogram or MRA). TREATMENT Treatment for PVD depends on the cause of your condition and the severity of your symptoms. It also depends on your age. Underlying  causes need to be treated and controlled. These include long-lasting (chronic) conditions, such as diabetes, high cholesterol, and high blood pressure. You may need to first try making lifestyle changes and taking medicines. Surgery may be needed if these do not work. Lifestyle changes may include:  Quitting smoking.  Exercising regularly.  Following a low-fat, low-cholesterol diet. Medicines may include:  Blood thinners to prevent blood clots.  Medicines to improve blood flow.  Medicines to improve your blood cholesterol levels. Surgical procedures may include:  A procedure that uses an inflated balloon to open a blocked artery and improve blood flow (angioplasty).  A procedure to put in a tube (stent) to keep a blocked artery open (stent implant).  Surgery to reroute blood flow around a blocked artery (peripheral bypass surgery).  Surgery to remove dead tissue from an infected wound on the affected limb.  Amputation. This is surgical removal of the affected limb. This may be necessary in cases of acute ischemic limb that are not improved through medical or surgical treatments. HOME CARE INSTRUCTIONS  Take medicines only as directed by your health care provider.  Do not use any tobacco products, including cigarettes, chewing tobacco, or electronic cigarettes. If you need help quitting, ask your health care provider.  Lose weight if you are overweight, and maintain a healthy weight as directed by your health care provider.  Eat a diet that is low in fat and cholesterol. If you need help, ask your health care provider.  Exercise regularly. Ask your health care provider to suggest some good activities for you.  Use compression stockings or other mechanical devices as directed by your health care provider.  Take good care of your feet.  Wear comfortable shoes that fit well.  Check your feet often for any cuts or sores. SEEK MEDICAL CARE IF:  You have cramps in your legs  while walking.  You have leg pain when you are at rest.  You have coldness in a leg or foot.  Your skin changes.  You have erectile dysfunction.  You have cuts or sores on your feet that are not healing. SEEK IMMEDIATE MEDICAL CARE IF:  Your arm or leg turns cold and blue.  Your arms or legs become red, warm, swollen, painful, or numb.  You have chest pain or trouble breathing.  You suddenly have weakness in your face, arm, or leg.  You become very confused or lose the ability to speak.  You suddenly have a very bad headache or lose your vision.   This information is not intended to replace advice given to you by your health care provider. Make sure you discuss any questions you have with your health care provider.   Document Released: 07/16/2004 Document Revised: 06/29/2014 Document Reviewed: 11/16/2013 Elsevier Interactive Patient Education 2016 Elsevier Inc.     Steps to Quit Smoking  Smoking tobacco can be harmful to your health and can affect almost every organ in your body. Smoking puts you, and those around you, at risk for developing many serious chronic diseases. Quitting smoking is difficult, but it is one of   the best things that you can do for your health. It is never too late to quit. WHAT ARE THE BENEFITS OF QUITTING SMOKING? When you quit smoking, you lower your risk of developing serious diseases and conditions, such as:  Lung cancer or lung disease, such as COPD.  Heart disease.  Stroke.  Heart attack.  Infertility.  Osteoporosis and bone fractures. Additionally, symptoms such as coughing, wheezing, and shortness of breath may get better when you quit. You may also find that you get sick less often because your body is stronger at fighting off colds and infections. If you are pregnant, quitting smoking can help to reduce your chances of having a baby of low birth weight. HOW DO I GET READY TO QUIT? When you decide to quit smoking, create a plan to  make sure that you are successful. Before you quit:  Pick a date to quit. Set a date within the next two weeks to give you time to prepare.  Write down the reasons why you are quitting. Keep this list in places where you will see it often, such as on your bathroom mirror or in your car or wallet.  Identify the people, places, things, and activities that make you want to smoke (triggers) and avoid them. Make sure to take these actions:  Throw away all cigarettes at home, at work, and in your car.  Throw away smoking accessories, such as ashtrays and lighters.  Clean your car and make sure to empty the ashtray.  Clean your home, including curtains and carpets.  Tell your family, friends, and coworkers that you are quitting. Support from your loved ones can make quitting easier.  Talk with your health care provider about your options for quitting smoking.  Find out what treatment options are covered by your health insurance. WHAT STRATEGIES CAN I USE TO QUIT SMOKING?  Talk with your healthcare provider about different strategies to quit smoking. Some strategies include:  Quitting smoking altogether instead of gradually lessening how much you smoke over a period of time. Research shows that quitting "cold turkey" is more successful than gradually quitting.  Attending in-person counseling to help you build problem-solving skills. You are more likely to have success in quitting if you attend several counseling sessions. Even short sessions of 10 minutes can be effective.  Finding resources and support systems that can help you to quit smoking and remain smoke-free after you quit. These resources are most helpful when you use them often. They can include:  Online chats with a counselor.  Telephone quitlines.  Printed self-help materials.  Support groups or group counseling.  Text messaging programs.  Mobile phone applications.  Taking medicines to help you quit smoking. (If you are  pregnant or breastfeeding, talk with your health care provider first.) Some medicines contain nicotine and some do not. Both types of medicines help with cravings, but the medicines that include nicotine help to relieve withdrawal symptoms. Your health care provider may recommend:  Nicotine patches, gum, or lozenges.  Nicotine inhalers or sprays.  Non-nicotine medicine that is taken by mouth. Talk with your health care provider about combining strategies, such as taking medicines while you are also receiving in-person counseling. Using these two strategies together makes you more likely to succeed in quitting than if you used either strategy on its own. If you are pregnant or breastfeeding, talk with your health care provider about finding counseling or other support strategies to quit smoking. Do not take medicine to help you   quit smoking unless told to do so by your health care provider. WHAT THINGS CAN I DO TO MAKE IT EASIER TO QUIT? Quitting smoking might feel overwhelming at first, but there is a lot that you can do to make it easier. Take these important actions:  Reach out to your family and friends and ask that they support and encourage you during this time. Call telephone quitlines, reach out to support groups, or work with a counselor for support.  Ask people who smoke to avoid smoking around you.  Avoid places that trigger you to smoke, such as bars, parties, or smoke-break areas at work.  Spend time around people who do not smoke.  Lessen stress in your life, because stress can be a smoking trigger for some people. To lessen stress, try:  Exercising regularly.  Deep-breathing exercises.  Yoga.  Meditating.  Performing a body scan. This involves closing your eyes, scanning your body from head to toe, and noticing which parts of your body are particularly tense. Purposefully relax the muscles in those areas.  Download or purchase mobile phone or tablet apps (applications)  that can help you stick to your quit plan by providing reminders, tips, and encouragement. There are many free apps, such as QuitGuide from the CDC (Centers for Disease Control and Prevention). You can find other support for quitting smoking (smoking cessation) through smokefree.gov and other websites. HOW WILL I FEEL WHEN I QUIT SMOKING? Within the first 24 hours of quitting smoking, you may start to feel some withdrawal symptoms. These symptoms are usually most noticeable 2-3 days after quitting, but they usually do not last beyond 2-3 weeks. Changes or symptoms that you might experience include:  Mood swings.  Restlessness, anxiety, or irritation.  Difficulty concentrating.  Dizziness.  Strong cravings for sugary foods in addition to nicotine.  Mild weight gain.  Constipation.  Nausea.  Coughing or a sore throat.  Changes in how your medicines work in your body.  A depressed mood.  Difficulty sleeping (insomnia). After the first 2-3 weeks of quitting, you may start to notice more positive results, such as:  Improved sense of smell and taste.  Decreased coughing and sore throat.  Slower heart rate.  Lower blood pressure.  Clearer skin.  The ability to breathe more easily.  Fewer sick days. Quitting smoking is very challenging for most people. Do not get discouraged if you are not successful the first time. Some people need to make many attempts to quit before they achieve long-term success. Do your best to stick to your quit plan, and talk with your health care provider if you have any questions or concerns.   This information is not intended to replace advice given to you by your health care provider. Make sure you discuss any questions you have with your health care provider.   Document Released: 06/02/2001 Document Revised: 10/23/2014 Document Reviewed: 10/23/2014 Elsevier Interactive Patient Education 2016 Elsevier Inc.  

## 2016-04-03 NOTE — Progress Notes (Signed)
[facility] [place]   CC: Follow up peripheral artery occlusive disease  History of Present Illness [name] [name] is a 65 y.o. male patient of Dr. Scot Dock who developed a blister on both feet approximately Sept., 2014. The blister on the left foot healed. He was having a persistent wound in the right foot and was referred for vascular consultation. He did not remember any specific injury to the foot. Dr. Scot Dock did not get any clear-cut history of claudication although it sounded like his activity was fairly limited. He did describe some rest pain in the right foot. However, this seems to be improving as the wound on his foot has healed. He does have a history of tobacco use but is trying to quit currently.  At his Dec., 2014 visit with Dr. 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 However, the wound on the dorsum of his right foot had healed at that point. His symptoms in the right foot were improving. For this reason Dr. Scot Dock held off on arteriography. He has had veins taken from both legs for his 5 vessel CABG in November, 2014. Based on his duplex, it is unlikely that he would have disease amenable to angioplasty on the right as the disease is fairly diffuse.  He has pain at posterior aspect of both calves after walking a mile, relieved by rest, he denies any recurring wounds, the past wounds on his right foot have healed.  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  He has CAD, denies any history of stroke or TIA.   Pt has not had previous intervention for PAD .   The patient reports New Medical or Surgical History: 07/06/14 had ICD implanted, sees Dr. Caryl Comes, electrophysiologist.  Pt Diabetic: Yes, he does not know his A1C, result not on file Pt smoker: smoker (1/3 ppd, started  smoking at age 35 yrs), he also gets second hand smoke from his girlfriend who smokes in the house   Pt meds include:  Statin :Yes  Betablocker: Yes  ASA: Yes  Other anticoagulants/antiplatelets: no    Past Medical History:  Diagnosis Date  . CAD (coronary artery disease)    a. 04/2013 Cath: LM 10, LAD 40p, 71m D1 50p, LCX 60p, 973m60m, OM1 50, OM2 100 L-L collats, RCA 100p;  b. CABG x 5: LIMA->LAD, VG->D2, VG->RI->OM2, VG->PDA.  . Marland Kitchenhronic systolic CHF (congestive heart failure) ([facility]   a. 04/2013 Echo: EF 15-20%, diff HK, sev HK of inf and ant myocardium, dilated LA,  b. EF 20-25% with restrictive filling pattern, mod RV dysfx, mild MR (10/10/2013);  c.  Echo 12/15: EF 25-30%  . COPD (chronic obstructive pulmonary disease) ([facility]  . Diabetes mellitus without complication ([facility]  . Emphysema   . Gout   . Hyperlipidemia   . Hypertension   . Ischemic cardiomyopathy   . PVD (peripheral vascular disease) ([facility]   a. (10/2013) ABIs: RIGHT 0.63, Waveforms: monophasic;  LEFT 0.78, Waveforms: monophasic  . Tobacco abuse    30+ pack-year history  . Transaminitis    a. 04/2013 Abd U/S and CT unremarkable, hepatitis panel neg -->felt to be 2/2 acute R heart failure.    Social History Social History  Substance Use Topics  . Smoking status: Current Every Day Smoker    Packs/day:  1.00    Years: 30.00    Types: Cigarettes  . Smokeless tobacco: Never Used     Comment: smokes 7-8 cigarettes a day  . Alcohol use No    Family History Family History  Problem Relation Age of Onset  . Diabetes Mother   . Hypertension    . Heart attack      Brothers  . Stroke Neg Hx     Past Surgical History:  Procedure Laterality Date  . APPENDECTOMY  as a child  . CARDIAC CATHETERIZATION  04/08/2015   Procedure: Left Heart Cath and Cors/Grafts Angiography;  Surgeon: Peter M Martinique, MD;  Location: [facility];  Service: Cardiovascular;;  . CORONARY ARTERY BYPASS GRAFT N/A 05/05/2013    Procedure: CORONARY ARTERY BYPASS GRAFTING (CABG) TIMES FIVE USING LEFT INTERNAL MAMMARY ARTERY AND RIGHT AND LEFT SAPHENOUS LEG VEIN HARVESTED ENDOSCOPICALLY;  Surgeon: Melrose Nakayama, MD;  Location: [place];  Service: Open Heart Surgery;  Laterality: N/A;  . IMPLANTABLE CARDIOVERTER DEFIBRILLATOR IMPLANT N/A 07/06/2014   Procedure: IMPLANTABLE CARDIOVERTER DEFIBRILLATOR IMPLANT;  Surgeon: Deboraha Sprang, MD;  [facility] C[facility] LAB;  Service: Cardiovascular;  Laterality: N/A;  . LEFT AND RIGHT HEART CATHETERIZATION WITH CORONARY ANGIOGRAM N/A 04/28/2013   Procedure: LEFT AND RIGHT HEART CATHETERIZATION WITH CORONARY ANGIOGRAM;  Surgeon: Burnell Blanks, MD;  Location: [facility] CATH LAB;  Service: Cardiovascular;  Laterality: N/A;  . MULTIPLE EXTRACTIONS WITH ALVEOLOPLASTY N/A 05/03/2013   Procedure: Extraction of tooth #s 3,4,5,6,8,9,27 with alveoloplast and maxillary left osseous tuberosity reduction;  Surgeon: Lenn Cal, DDS;  Location: [place];  Service: Oral Surgery;  Laterality: N/A;    Allergies  Allergen Reactions  . Penicillins Other (See Comments) and Nausea And Vomiting    Per Dr Audria Nine Via phone 12/27/12 0400 Swelling , long time ago. Doesn't remember    Current Outpatient Prescriptions  Medication Sig Dispense Refill  . allopurinol (ZYLOPRIM) 300 MG tablet Take 300 mg by mouth daily.    Marland Kitchen aspirin EC 81 MG tablet TAKE ONE TABLET BY MOUTH ONCE DAILY 30 tablet 3  . atorvastatin (LIPITOR) 20 MG tablet Take 20 mg by mouth daily.    Marland Kitchen b complex vitamins tablet Take 1 tablet by mouth daily.    . carvedilol (COREG) 3.125 MG tablet TAKE 2 TABS BY MOUTH IN THE AM & 3 TABS BY MOUTH IN THE PM    . furosemide (LASIX) 40 MG tablet TAKE 60 MG BY MOUTH TWICE DAILY 90 tablet 3  . gabapentin (NEURONTIN) 300 MG capsule Take 300 mg by mouth 2 (two) times daily.    Marland Kitchen omeprazole (PRILOSEC) 20 MG capsule Take 20 mg by mouth daily.    . potassium chloride SA (K-DUR,KLOR-CON) 20  MEQ tablet TAKE ONE TABLET BY MOUTH ONCE DAILY 30 tablet 8  . sacubitril-valsartan (ENTRESTO) 24-26 MG Take 1 tablet by mouth 2 (two) times daily. 60 tablet 6  . spironolactone (ALDACTONE) 25 MG tablet Take 1 tablet (25 mg total) by mouth daily. 30 tablet 3  . albuterol (PROVENTIL HFA;VENTOLIN HFA) 108 (90 BASE) MCG/ACT inhaler Inhale 1 puff into the lungs every 6 (six) hours as needed for wheezing or shortness of breath.    . budesonide-formoterol (SYMBICORT) 80-4.5 MCG/ACT inhaler Inhale 2 puffs into the lungs 2 (two) times daily. (Patient not taking: Reported on 04/03/2016) 1 Inhaler 0  . colchicine (COLCRYS) 0.6 MG tablet Take 0.6 mg by mouth daily as needed (gout).    . lubiprostone (AMITIZA)  24 MCG capsule Take 24 mcg by mouth 2 (two) times daily with a meal.     . Olopatadine HCl (PATADAY) 0.2 % SOLN Place 1 drop into both eyes 2 (two) times daily.    Marland Kitchen tiotropium (SPIRIVA) 18 MCG inhalation capsule Place 18 mcg into inhaler and inhale daily.    . traMADol (ULTRAM) 50 MG tablet Take 50 mg by mouth every 6 (six) hours as needed for moderate pain.     No current facility-administered medications for this visit.     ROS: See HPI for pertinent positives and negatives.   Physical Examination  Vitals:   04/03/16 1441  BP: 116/77  Pulse: 74  Resp: (!) 22  Temp: 97.1 F (36.2 C)  TempSrc: Oral  SpO2: 95%  Weight: 228 lb (103.4 kg)  Height: 5' 7.5" (1.715 m)   Body mass index is 35.18 kg/m.  General: A&O x 3, WDWN, obese male.  Gait: using cane, slow and deliberate  Eyes: Pupils =.  Pulmonary: Respirations are non labored, CTAB, without wheezes , rales or rhonchi, decreased air movement in posterior fields, occasional moist cough  Cardiac: regular rhythm and rate, no detected murmur. ICD palpated subcutaneously left side of chest.  Carotid Bruits  Left  Right    Negative  Negative    Aorta is not palpable.  Radial pulses: are 2+ palpable and =   VASCULAR  EXAM:  Extremities without ischemic changes  without Gangrene; without open wounds. Feet are pink and warm, skin is dry, evidence of healed ulcer at dorsum of right foot, onychomycosis all toenails    LE Pulses  LEFT  RIGHT   FEMORAL   palpable   palpable   POPLITEAL  not palpable  not palpable   POSTERIOR TIBIAL  not palpable, monophasic by Doppler  not palpable, monophasic by Doppler  DORSALIS PEDIS  ANTERIOR TIBIAL  not palpable, monophasic by Doppler  not palpable, monophasic by Doppler  PERONEAL  not Palpable  not Palpable    Abdomen: soft, NT, no masses palpated.  Skin: no rashes, no ulcers.  Musculoskeletal: no muscle wasting or atrophy.  Neurologic: A&O X 3; Appropriate Affect ; SENSATION: normal; MOTOR FUNCTION: moving all extremities equally, motor strength 5/5 in UE's, 4/5 in LE's. Speech is fluent/normal. CN 2-12 intact.   ASSESSMENT: Ronaldo Crilly is a 65 y.o. male who presents with stable bilateral LE moderate arterial occlusive disease in the background of DM and long term tobacco use. He has an equal degree of pain at the posterior aspects of both calves after walking a mile, relieved by rest.  The ulcers in his feet have healed. There are no signs of ischemia in his feet/legs.   He had a cardiac cath on 04/08/15.  DATA Bilateral LE arterial Duplex result from Dec., 2014 showed diffuse plaque throughout lower extremities.  Today's ABI's remain stable compared to 03/20/15: moderate arterial occlusive disease bilaterally with all monophasic waveforms; bilateral TBI's have declined: right: 0.28, was 0.54; Left: 0.36, was 0.58.   PLAN:  Graduated walking program discussed and how to achieve. The patient was counseled re smoking cessation and given several free resources re smoking cessation.  Based on the patient's vascular studies and examination, pt will return to clinic in 1 year with ABI's. I advised him to return sooner if he develops  concerns re the circulation in his feet/legs.   I discussed in depth with the patient the nature of atherosclerosis, and emphasized the importance of maximal medical management  including strict control of blood pressure, blood glucose, and lipid levels, obtaining regular exercise, and cessation of smoking.  The patient is aware that without maximal medical management the underlying atherosclerotic disease process will progress, limiting the benefit of any interventions.  The patient was given information about PAD including signs, symptoms, treatment, what symptoms should prompt the patient to seek immediate medical care, and risk reduction measures to take.  Clemon Chambers, RN, MSN, FNP-C [facility] Phone: [phone]  Clinic MD: Donzetta Matters on call  04/03/16 3:03 PM

## 2016-04-09 ENCOUNTER — Other Ambulatory Visit (HOSPITAL_COMMUNITY): Payer: Self-pay | Admitting: *Deleted

## 2016-04-09 MED ORDER — FUROSEMIDE 20 MG PO TABS: 60.0000 mg | ORAL_TABLET | Freq: Two times a day (BID) | ORAL | 3 refills | Status: DC

## 2016-05-05 NOTE — Addendum Note (Signed)
Addended by: Mena Goes on: 05/05/2016 02:55 PM   Modules accepted: Orders

## 2016-05-08 ENCOUNTER — Encounter: Payer: Self-pay | Admitting: Sports Medicine

## 2016-05-08 ENCOUNTER — Ambulatory Visit (INDEPENDENT_AMBULATORY_CARE_PROVIDER_SITE_OTHER): Payer: Medicaid Other | Admitting: Sports Medicine

## 2016-05-08 DIAGNOSIS — F17200 Nicotine dependence, unspecified, uncomplicated: Secondary | ICD-10-CM

## 2016-05-08 DIAGNOSIS — B351 Tinea unguium: Secondary | ICD-10-CM

## 2016-05-08 DIAGNOSIS — M79676 Pain in unspecified toe(s): Secondary | ICD-10-CM | POA: Diagnosis not present

## 2016-05-08 DIAGNOSIS — E1140 Type 2 diabetes mellitus with diabetic neuropathy, unspecified: Secondary | ICD-10-CM

## 2016-05-08 DIAGNOSIS — L853 Xerosis cutis: Secondary | ICD-10-CM

## 2016-05-08 DIAGNOSIS — I739 Peripheral vascular disease, unspecified: Secondary | ICD-10-CM

## 2016-05-08 NOTE — Progress Notes (Signed)
Patient ID: [name] [name], male   DOB: [birth year], 65 y.o.   MRN: [id number]  Subjective: [name] [name] is a 65 y.o. male patient with history of type 2 diabetes who returns to office today complaining of long, painful nails  while ambulating in shoes, unable to trim. Patient states that the glucose reading this morning was 121 yesterday.Patient denies any new changes in medication or new problems. Patient denies any new cramping, numbness, burning or tingling in the legs.  Patient Active Problem List   Diagnosis Date Noted  . Chest pain 04/08/2015  . Abnormal nuclear stress test 04/08/2015  . Ischemic cardiomyopathy 07/06/2014  . Lumbago 03/13/2014  . Special screening for malignant neoplasms, colon 02/09/2014  . Normocytic anemia 02/09/2014  . Pleural effusion on left 11/28/2013  . Atherosclerosis of native arteries of extremity with intermittent claudication ([place]) 11/07/2013  . Weakness of both legs 11/07/2013  . Smoker 09/19/2013  . Numbness in right leg/ Foot 06/14/2013  . Pain in limb-Right Leg/foot 06/14/2013  . Atherosclerosis of native arteries of the extremities with ulceration(440.23) 06/14/2013  . Chronic systolic heart failure ([place]) 05/29/2013  . Atrial fibrillation ([place]) 05/29/2013  . Chest pain, mid sternal 05/19/2013  . S/P CABG x 5 05/08/2013  . Right foot ulcer ([place]) 05/04/2013  . Coronary atherosclerosis of native coronary artery 04/28/2013  . Acute systolic heart failure ([place]) 04/28/2013  . Abnormal blood chemistry 12/30/2012  . Abnormal LFTs 12/30/2012  . Aspiration pneumonia ([place]) 12/30/2012  . Chronic congestive heart failure ([place]) 12/27/2012  . Leg weakness 12/26/2012  . Pneumonia 05/18/2011  . Gout 05/17/2011  . Respiratory distress, acute 05/17/2011  . Hypertension 05/17/2011  . COPD (chronic obstructive pulmonary disease) ([place]) 05/17/2011  . CHF, acute ([place]) 05/17/2011   Current Outpatient Prescriptions on File Prior to Visit  Medication Sig Dispense Refill   . albuterol (PROVENTIL HFA;VENTOLIN HFA) 108 (90 BASE) MCG/ACT inhaler Inhale 1 puff into the lungs every 6 (six) hours as needed for wheezing or shortness of breath.    . allopurinol (ZYLOPRIM) 300 MG tablet Take 300 mg by mouth daily.    Marland Kitchen aspirin EC 81 MG tablet TAKE ONE TABLET BY MOUTH ONCE DAILY 30 tablet 3  . atorvastatin (LIPITOR) 20 MG tablet Take 20 mg by mouth daily.    Marland Kitchen b complex vitamins tablet Take 1 tablet by mouth daily.    . budesonide-formoterol (SYMBICORT) 80-4.5 MCG/ACT inhaler Inhale 2 puffs into the lungs 2 (two) times daily. (Patient not taking: Reported on 04/03/2016) 1 Inhaler 0  . carvedilol (COREG) 3.125 MG tablet TAKE 2 TABS BY MOUTH IN THE AM & 3 TABS BY MOUTH IN THE PM    . colchicine (COLCRYS) 0.6 MG tablet Take 0.6 mg by mouth daily as needed (gout).    . furosemide (LASIX) 20 MG tablet Take 3 tablets (60 mg total) by mouth 2 (two) times daily. TAKE 60 MG BY MOUTH TWICE DAILY 180 tablet 3  . gabapentin (NEURONTIN) 300 MG capsule Take 300 mg by mouth 2 (two) times daily.    Marland Kitchen lubiprostone (AMITIZA) 24 MCG capsule Take 24 mcg by mouth 2 (two) times daily with a meal.     . Olopatadine HCl (PATADAY) 0.2 % SOLN Place 1 drop into both eyes 2 (two) times daily.    Marland Kitchen omeprazole (PRILOSEC) 20 MG capsule Take 20 mg by mouth daily.    . potassium chloride SA (K-DUR,KLOR-CON) 20 MEQ tablet TAKE ONE TABLET BY MOUTH ONCE DAILY 30 tablet 8  .  sacubitril-valsartan (ENTRESTO) 24-26 MG Take 1 tablet by mouth 2 (two) times daily. 60 tablet 6  . spironolactone (ALDACTONE) 25 MG tablet Take 1 tablet (25 mg total) by mouth daily. 30 tablet 3  . tiotropium (SPIRIVA) 18 MCG inhalation capsule Place 18 mcg into inhaler and inhale daily.    . traMADol (ULTRAM) 50 MG tablet Take 50 mg by mouth every 6 (six) hours as needed for moderate pain.     No current facility-administered medications on file prior to visit.    Allergies  Allergen Reactions  . Penicillins Other (See Comments) and  Nausea And Vomiting    Per Dr Audria Nine Via phone 12/27/12 0400 Swelling , long time ago. Doesn't remember   Objective: General: Patient is awake, alert, and oriented x 3 and in no acute distress.  Integument: Skin is warm, dry and supple bilateral. Moderate plantar xerosis bilateral. Nails are tender, long, thickened and dystrophic with subungual debris, consistent with onychomycosis, 1-5 bilateral. No signs of infection. No open lesions or preulcerative lesions present bilateral. Remaining integument unremarkable.  Vasculature:  Dorsalis Pedis pulse 1/4 bilateral. Posterior Tibial pulse  0/4 bilateral.  Capillary fill time <3 sec 1-5 bilateral. Scant hair growth to the level of the digits. Temperature gradient within normal limits. No varicosities present bilateral. Mild trace edema present bilateral ankles.   Neurology: The patient has diminished sensation measured with a 5.07/10g Semmes Weinstein Monofilament at all pedal sites bilateral . Vibratory sensation diminished bilateral with tuning fork. No Babinski sign present bilateral.   Musculoskeletal: No gross pedal deformities noted bilateral. Muscular strength 5/5 in all lower extremity muscular groups bilateral without pain or limitation on range of motion . No tenderness with calf compression bilateral.  Assessment and Plan: Problem List Items Addressed This Visit      Other   Smoker    Other Visit Diagnoses    Dermatophytosis of nail    -  Primary   Pain of toe, unspecified laterality       Type 2 diabetes, controlled, with neuropathy ([place])       Peripheral vascular disease ([place])       Xerosis of skin         -Examined patient. -Discussed and educated patient on diabetic foot care, especially with regards to the vascular, neurological and musculoskeletal systems.  -Stressed the importance of good glycemic control and the detriment of not  controlling glucose levels in relation to the foot. -Encouraged smoking  cessation and to consider Diabetic Education; attending classes offered by [facility]; still wants to think about it -Mechanically debrided all nails 1-5 bilateral using sterile nail nipper and filed with dremel without incident  -Cont. B-vitamin and Gabapentin for neuropathy -Recommend OTC skin emollient for dry skin daily; Vaseline or  Okeeffe's healthy feet  -Recommend continue with good supportive shoes -Answered all patient questions -Patient to return in 3 months for at risk foot care -Patient advised to call the office if any problems or questions arise in the meantime.  Landis Martins, DPM

## 2016-05-19 ENCOUNTER — Encounter: Payer: Medicaid Other | Admitting: *Deleted

## 2016-05-19 ENCOUNTER — Telehealth: Payer: Self-pay | Admitting: Cardiology

## 2016-05-19 NOTE — Telephone Encounter (Signed)
Spoke with pt and reminded pt of remote transmission that is due today. Pt verbalized understanding.   

## 2016-05-22 ENCOUNTER — Encounter: Payer: Self-pay | Admitting: Cardiology

## 2016-07-22 ENCOUNTER — Other Ambulatory Visit: Payer: Self-pay | Admitting: *Deleted

## 2016-07-22 MED ORDER — POTASSIUM CHLORIDE CRYS ER 20 MEQ PO TBCR: 20.0000 meq | EXTENDED_RELEASE_TABLET | Freq: Every day | ORAL | 0 refills | Status: DC

## 2016-07-24 DIAGNOSIS — I509 Heart failure, unspecified: Secondary | ICD-10-CM | POA: Diagnosis not present

## 2016-07-24 DIAGNOSIS — R32 Unspecified urinary incontinence: Secondary | ICD-10-CM | POA: Diagnosis not present

## 2016-07-27 ENCOUNTER — Telehealth (HOSPITAL_COMMUNITY): Payer: Self-pay | Admitting: *Deleted

## 2016-07-27 MED ORDER — FUROSEMIDE 20 MG PO TABS: 60.0000 mg | ORAL_TABLET | Freq: Two times a day (BID) | ORAL | 3 refills | Status: DC

## 2016-07-27 NOTE — Telephone Encounter (Signed)
Refill

## 2016-08-04 DIAGNOSIS — M545 Low back pain: Secondary | ICD-10-CM | POA: Diagnosis not present

## 2016-08-14 ENCOUNTER — Ambulatory Visit (INDEPENDENT_AMBULATORY_CARE_PROVIDER_SITE_OTHER): Payer: Medicare HMO | Admitting: Sports Medicine

## 2016-08-14 ENCOUNTER — Encounter: Payer: Self-pay | Admitting: Sports Medicine

## 2016-08-14 DIAGNOSIS — F17200 Nicotine dependence, unspecified, uncomplicated: Secondary | ICD-10-CM

## 2016-08-14 DIAGNOSIS — B351 Tinea unguium: Secondary | ICD-10-CM

## 2016-08-14 DIAGNOSIS — E1140 Type 2 diabetes mellitus with diabetic neuropathy, unspecified: Secondary | ICD-10-CM

## 2016-08-14 DIAGNOSIS — I739 Peripheral vascular disease, unspecified: Secondary | ICD-10-CM

## 2016-08-14 DIAGNOSIS — R32 Unspecified urinary incontinence: Secondary | ICD-10-CM | POA: Diagnosis not present

## 2016-08-14 DIAGNOSIS — L853 Xerosis cutis: Secondary | ICD-10-CM

## 2016-08-14 DIAGNOSIS — M79676 Pain in unspecified toe(s): Secondary | ICD-10-CM

## 2016-08-14 DIAGNOSIS — I509 Heart failure, unspecified: Secondary | ICD-10-CM | POA: Diagnosis not present

## 2016-08-14 NOTE — Progress Notes (Signed)
Patient ID: [name] [name], male   DOB: [birth year], 66 y.o.   MRN: [id number]  Subjective: [name] [name] is a 65 y.o. male patient with history of type 2 diabetes who returns to office today complaining of long, painful nails  while ambulating in shoes, unable to trim. Patient states that the glucose reading this morning was "good".Patient denies any new changes in medication or new problems. Patient denies any new cramping, numbness, burning or tingling in the legs.  Patient Active Problem List   Diagnosis Date Noted  . Chest pain 04/08/2015  . Abnormal nuclear stress test 04/08/2015  . Ischemic cardiomyopathy 07/06/2014  . Lumbago 03/13/2014  . Special screening for malignant neoplasms, colon 02/09/2014  . Normocytic anemia 02/09/2014  . Pleural effusion on left 11/28/2013  . Atherosclerosis of native arteries of extremity with intermittent claudication ([place]) 11/07/2013  . Weakness of both legs 11/07/2013  . Smoker 09/19/2013  . Numbness in right leg/ Foot 06/14/2013  . Pain in limb-Right Leg/foot 06/14/2013  . Atherosclerosis of native arteries of the extremities with ulceration(440.23) 06/14/2013  . Chronic systolic heart failure ([place]) 05/29/2013  . Atrial fibrillation ([place]) 05/29/2013  . Chest pain, mid sternal 05/19/2013  . S/P CABG x 5 05/08/2013  . Right foot ulcer ([place]) 05/04/2013  . Coronary atherosclerosis of native coronary artery 04/28/2013  . Acute systolic heart failure ([place]) 04/28/2013  . Abnormal blood chemistry 12/30/2012  . Abnormal LFTs 12/30/2012  . Aspiration pneumonia ([place]) 12/30/2012  . Chronic congestive heart failure ([place]) 12/27/2012  . Leg weakness 12/26/2012  . Pneumonia 05/18/2011  . Gout 05/17/2011  . Respiratory distress, acute 05/17/2011  . Hypertension 05/17/2011  . COPD (chronic obstructive pulmonary disease) ([place]) 05/17/2011  . CHF, acute ([place]) 05/17/2011   Current Outpatient Prescriptions on File Prior to Visit  Medication Sig Dispense Refill  .  albuterol (PROVENTIL HFA;VENTOLIN HFA) 108 (90 BASE) MCG/ACT inhaler Inhale 1 puff into the lungs every 6 (six) hours as needed for wheezing or shortness of breath.    . allopurinol (ZYLOPRIM) 300 MG tablet Take 300 mg by mouth daily.    Marland Kitchen aspirin EC 81 MG tablet TAKE ONE TABLET BY MOUTH ONCE DAILY 30 tablet 3  . atorvastatin (LIPITOR) 20 MG tablet Take 20 mg by mouth daily.    Marland Kitchen b complex vitamins tablet Take 1 tablet by mouth daily.    . budesonide-formoterol (SYMBICORT) 80-4.5 MCG/ACT inhaler Inhale 2 puffs into the lungs 2 (two) times daily. (Patient not taking: Reported on 04/03/2016) 1 Inhaler 0  . carvedilol (COREG) 3.125 MG tablet TAKE 2 TABS BY MOUTH IN THE AM & 3 TABS BY MOUTH IN THE PM    . colchicine (COLCRYS) 0.6 MG tablet Take 0.6 mg by mouth daily as needed (gout).    . furosemide (LASIX) 20 MG tablet Take 3 tablets (60 mg total) by mouth 2 (two) times daily. TAKE 60 MG BY MOUTH TWICE DAILY 180 tablet 3  . gabapentin (NEURONTIN) 300 MG capsule Take 300 mg by mouth 2 (two) times daily.    Marland Kitchen lubiprostone (AMITIZA) 24 MCG capsule Take 24 mcg by mouth 2 (two) times daily with a meal.     . Olopatadine HCl (PATADAY) 0.2 % SOLN Place 1 drop into both eyes 2 (two) times daily.    Marland Kitchen omeprazole (PRILOSEC) 20 MG capsule Take 20 mg by mouth daily.    . potassium chloride SA (K-DUR,KLOR-CON) 20 MEQ tablet Take 1 tablet (20 mEq total) by mouth daily. 30 tablet 0  .  sacubitril-valsartan (ENTRESTO) 24-26 MG Take 1 tablet by mouth 2 (two) times daily. 60 tablet 6  . spironolactone (ALDACTONE) 25 MG tablet Take 1 tablet (25 mg total) by mouth daily. 30 tablet 3  . tiotropium (SPIRIVA) 18 MCG inhalation capsule Place 18 mcg into inhaler and inhale daily.    . traMADol (ULTRAM) 50 MG tablet Take 50 mg by mouth every 6 (six) hours as needed for moderate pain.     No current facility-administered medications on file prior to visit.    Allergies  Allergen Reactions  . Penicillins Other (See Comments)  and Nausea And Vomiting    Per Dr Audria Nine Via phone 12/27/12 0400 Swelling , long time ago. Doesn't remember   Objective: General: Patient is awake, alert, and oriented x 3 and in no acute distress.  Integument: Skin is warm, dry and supple bilateral. Moderate plantar xerosis bilateral. Nails are tender, long, thickened and dystrophic with subungual debris, consistent with onychomycosis, 1-5 bilateral. No signs of infection. No open lesions or preulcerative lesions present bilateral. Remaining integument unremarkable.  Vasculature:  Dorsalis Pedis pulse 1/4 bilateral. Posterior Tibial pulse  0/4 bilateral.  Capillary fill time <3 sec 1-5 bilateral. Scant hair growth to the level of the digits. Temperature gradient within normal limits. No varicosities present bilateral. Mild trace edema present bilateral ankles.   Neurology: The patient has diminished sensation measured with a 5.07/10g Semmes Weinstein Monofilament at all pedal sites bilateral . Vibratory sensation diminished bilateral with tuning fork. No Babinski sign present bilateral.   Musculoskeletal: No gross pedal deformities noted bilateral. Muscular strength 5/5 in all lower extremity muscular groups bilateral without pain or limitation on range of motion . No tenderness with calf compression bilateral.  Assessment and Plan: Problem List Items Addressed This Visit      Other   Smoker    Other Visit Diagnoses    Dermatophytosis of nail    -  Primary   Pain of toe, unspecified laterality       Type 2 diabetes, controlled, with neuropathy ([place])       Peripheral vascular disease ([place])       Xerosis of skin         -Examined patient. -Discussed and educated patient on diabetic foot care, especially with regards to the vascular, neurological and musculoskeletal systems.  -Stressed the importance of good glycemic control and the detriment of not controlling glucose levels in relation to the foot. -Mechanically debrided  all nails 1-5 bilateral using sterile nail nipper and filed with dremel without incident  -Cont. B-vitamin and Gabapentin for neuropathy -Recommend OTC skin emollient for dry skin daily; Vaseline or  Okeeffe's healthy feet again to patient -Recommend continue with good supportive shoes -Answered all patient questions -Patient to return in 3 months for at risk foot care -Patient advised to call the office if any problems or questions arise in the meantime.  Landis Martins, DPM

## 2016-08-20 ENCOUNTER — Encounter: Payer: Self-pay | Admitting: Internal Medicine

## 2016-08-26 ENCOUNTER — Ambulatory Visit: Payer: Self-pay | Admitting: Internal Medicine

## 2016-08-27 DIAGNOSIS — I509 Heart failure, unspecified: Secondary | ICD-10-CM | POA: Diagnosis not present

## 2016-08-27 DIAGNOSIS — R32 Unspecified urinary incontinence: Secondary | ICD-10-CM | POA: Diagnosis not present

## 2016-08-28 ENCOUNTER — Encounter: Payer: Self-pay | Admitting: Internal Medicine

## 2016-08-28 ENCOUNTER — Ambulatory Visit (INDEPENDENT_AMBULATORY_CARE_PROVIDER_SITE_OTHER): Payer: Medicare HMO | Admitting: Internal Medicine

## 2016-08-28 ENCOUNTER — Encounter (INDEPENDENT_AMBULATORY_CARE_PROVIDER_SITE_OTHER): Payer: Self-pay

## 2016-08-28 VITALS — BP 104/64 | HR 92 | Ht 67.0 in | Wt 223.6 lb

## 2016-08-28 DIAGNOSIS — I255 Ischemic cardiomyopathy: Secondary | ICD-10-CM | POA: Diagnosis not present

## 2016-08-28 DIAGNOSIS — Z95810 Presence of automatic (implantable) cardiac defibrillator: Secondary | ICD-10-CM

## 2016-08-28 DIAGNOSIS — I5022 Chronic systolic (congestive) heart failure: Secondary | ICD-10-CM

## 2016-08-28 DIAGNOSIS — Z79899 Other long term (current) drug therapy: Secondary | ICD-10-CM | POA: Diagnosis not present

## 2016-08-28 LAB — CUP PACEART INCLINIC DEVICE CHECK
Brady Statistic RV Percent Paced: 0 %
HighPow Impedance: 77.625
Implantable Lead Location: 753860
Implantable Lead Model: 7122
Lead Channel Impedance Value: 412.5 Ohm
Lead Channel Pacing Threshold Pulse Width: 0.5 ms
Lead Channel Pacing Threshold Pulse Width: 0.5 ms
Lead Channel Setting Pacing Amplitude: 2.5 V
Lead Channel Setting Sensing Sensitivity: 0.5 mV
MDC IDC LEAD IMPLANT DT: 20160115
MDC IDC MSMT LEADCHNL RV PACING THRESHOLD AMPLITUDE: 1 V
MDC IDC MSMT LEADCHNL RV PACING THRESHOLD AMPLITUDE: 1 V
MDC IDC MSMT LEADCHNL RV SENSING INTR AMPL: 6.9 mV
MDC IDC PG IMPLANT DT: 20160115
MDC IDC SESS DTM: 20180309170904
MDC IDC SET LEADCHNL RV PACING PULSEWIDTH: 0.5 ms
Pulse Gen Serial Number: 7233603

## 2016-08-28 NOTE — Progress Notes (Signed)
Electrophysiology Office Note   Date:  08/28/2016   ID:  [name] [name], DOB Oct 24, 1950, MRN [id number]  PCP:  [name], PA-C  Cardiologist:   Primary Electrophysiologist:    [name], MD    No chief complaint on file.    History of Present Illness: [name] [name] is a 66 y.o. 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 Moderate RV dysfunction. Mild MR. Echo  7/17  EF  30-35%   .   Today, he denies  symptoms of palpitations, chest pain, shortness of breath, orthopnea, PND, lower extremity edema, claudication, dizziness, presyncope, syncope, bleeding, or neurologic sequela. The patient is tolerating medications without difficulties   He has complaints of back pain and a sense of heaviness on his feet  He has vascualr disease followed by [facility]   He continues to smoke   Lab work was last checked prior to his ICD. Potassium 1/16 was 4.7 creatinine 1.6  Past Medical History:  Diagnosis Date  . CAD (coronary artery disease)    a. 04/2013 Cath: LM 10, LAD 40p, 82m D1 50p, LCX 60p, 943m74m, OM1 50, OM2 100 L-L collats, RCA 100p;  b. CABG x 5: LIMA->LAD, VG->D2, VG->RI->OM2, VG->PDA.  . Marland Kitchenhronic systolic CHF (congestive heart failure) ([facility]   a. 04/2013 Echo: EF 15-20%, diff HK, sev HK of inf and ant myocardium, dilated LA,  b. EF 20-25% with restrictive filling pattern, mod RV dysfx, mild MR (10/10/2013);  c.  Echo 12/15: EF 25-30%  . COPD (chronic obstructive pulmonary disease) ([facility]  . Diabetes mellitus without complication ([facility]  . Emphysema   . Gout   . Hyperlipidemia   . Hypertension   . Ischemic cardiomyopathy   . PVD (peripheral vascular disease) ([facility]   a. (10/2013) ABIs: RIGHT 0.63,  Waveforms: monophasic;  LEFT 0.78, Waveforms: monophasic  . Tobacco abuse    30+ pack-year history  . Transaminitis    a. 04/2013 Abd U/S and CT unremarkable, hepatitis panel neg -->felt to be 2/2 acute R heart failure.   Past Surgical History:  Procedure Laterality Date  . APPENDECTOMY  as a child  . CARDIAC CATHETERIZATION  04/08/2015   Procedure: Left Heart Cath and Cors/Grafts Angiography;  Surgeon: Peter M JoMartiniqueMD;  [facility];  Service: Cardiovascular;;  . CORONARY ARTERY BYPASS GRAFT N/A 05/05/2013   Procedure: CORONARY ARTERY BYPASS GRAFTING (CABG) TIMES FIVE USING LEFT INTERNAL MAMMARY ARTERY AND RIGHT AND LEFT SAPHENOUS LEG VEIN HARVESTED ENDOSCOPICALLY;  Surgeon: StMelrose NakayamaMD;  [facility] Service: Open Heart Surgery;  Laterality: N/A;  . IMPLANTABLE CARDIOVERTER DEFIBRILLATOR IMPLANT N/A 07/06/2014   Procedure: IMPLANTABLE CARDIOVERTER DEFIBRILLATOR IMPLANT;  Surgeon: StDeboraha SprangMD;  [facility];  Service: Cardiovascular;  Laterality: N/A;  . LEFT AND RIGHT HEART CATHETERIZATION WITH CORONARY ANGIOGRAM N/A 04/28/2013   Procedure: LEFT AND RIGHT HEART CATHETERIZATION WITH CORONARY ANGIOGRAM;  Surgeon: ChBurnell BlanksMD;  [facility];  Service: Cardiovascular;  Laterality: N/A;  . MULTIPLE EXTRACTIONS WITH ALVEOLOPLASTY N/A 05/03/2013   Procedure: Extraction of tooth #s 3,4,5,6,8,9,27 with alveoloplast and maxillary left osseous tuberosity reduction;  Surgeon:  Lenn Cal, DDS;  Location: [place];  Service: Oral Surgery;  Laterality: N/A;     Current Outpatient Prescriptions  Medication Sig Dispense Refill  . albuterol (PROVENTIL HFA;VENTOLIN HFA) 108 (90 BASE) MCG/ACT inhaler Inhale 1 puff into the lungs every 6 (six) hours as needed for wheezing or shortness of breath.    . allopurinol (ZYLOPRIM) 300 MG tablet Take 300 mg by mouth daily.    Marland Kitchen aspirin EC 81 MG tablet TAKE ONE TABLET BY MOUTH ONCE DAILY 30  tablet 3  . atorvastatin (LIPITOR) 20 MG tablet Take 20 mg by mouth daily.    Marland Kitchen b complex vitamins tablet Take 1 tablet by mouth daily.    . budesonide-formoterol (SYMBICORT) 80-4.5 MCG/ACT inhaler Inhale 2 puffs into the lungs 2 (two) times daily. 1 Inhaler 0  . carvedilol (COREG) 3.125 MG tablet TAKE 2 TABS BY MOUTH IN THE AM & 3 TABS BY MOUTH IN THE PM    . colchicine (COLCRYS) 0.6 MG tablet Take 0.6 mg by mouth daily as needed (gout).    . furosemide (LASIX) 20 MG tablet Take 3 tablets (60 mg total) by mouth 2 (two) times daily. TAKE 60 MG BY MOUTH TWICE DAILY 180 tablet 3  . gabapentin (NEURONTIN) 300 MG capsule Take 300 mg by mouth 2 (two) times daily.    Marland Kitchen lubiprostone (AMITIZA) 24 MCG capsule Take 24 mcg by mouth 2 (two) times daily with a meal.     . Olopatadine HCl (PATADAY) 0.2 % SOLN Place 1 drop into both eyes 2 (two) times daily.    Marland Kitchen omeprazole (PRILOSEC) 20 MG capsule Take 20 mg by mouth daily.    . potassium chloride SA (K-DUR,KLOR-CON) 20 MEQ tablet Take 1 tablet (20 mEq total) by mouth daily. 30 tablet 0  . sacubitril-valsartan (ENTRESTO) 24-26 MG Take 1 tablet by mouth 2 (two) times daily. 60 tablet 6  . spironolactone (ALDACTONE) 25 MG tablet Take 1 tablet (25 mg total) by mouth daily. 30 tablet 3  . tiotropium (SPIRIVA) 18 MCG inhalation capsule Place 18 mcg into inhaler and inhale daily.    . traMADol (ULTRAM) 50 MG tablet Take 50 mg by mouth every 6 (six) hours as needed for moderate pain.     No current facility-administered medications for this visit.     Allergies:   Penicillins   Social History:  The patient  reports that he has been smoking Cigarettes.  He has a 30.00 pack-year smoking history. He has never used smokeless tobacco. He reports that he does not drink alcohol or use drugs.   Family History:  The patient's    family history includes Diabetes in his mother.    ROS:  Please see the history of present illness and past medical history  Otherwise, all  other systems were reviewed and were negative except for sweating.     PHYSICAL EXAM: VS:  BP 104/64   Pulse 92   Ht '5\' 7"'$  (1.702 m)   Wt 223 lb 9.6 oz (101.4 kg)   SpO2 97%   BMI 35.02 kg/m  , BMI Body mass index is 35.02 kg/m. GEN: Well nourished, well developed, in no acute distress HEENT: normal Neck:  JVD flat , carotid bruits, or masses Cardiac:REGULAR RATE and RHYTHM ;  No murmurs, rubs,  *No S4  Back without kyphosis; No CVAT Respiratory:  clear to auscultation bilaterally, normal work of breathing GI: soft, nontender, nondistended, + BS MS: no deformity or atrophy Extremities no clubbing  cyanosis   edema Skin: warm and dry,  device pocket is well healed without teathering Neuro:  Strength and sensation are intact Psych: euthymic mood, full affect  EKG:  EKG is ordered today. The ekg ordered today shows sinus rhythm at 74 and a slight interval 17/14/43 Right bundle branch block with left anterior fascicular block  Device interrogation is reviewed today in detail.  See PaceArt for details.   Recent Labs: 12/13/2015: Brain Natriuretic Peptide 113.1 12/25/2015: BUN 30; Creat 1.35; Potassium 4.0; Sodium 138    Lipid Panel     Component Value Date/Time   CHOL 98 05/01/2013 0356   TRIG 71 05/01/2013 0356   HDL 31 (L) 05/01/2013 0356   CHOLHDL 3.2 05/01/2013 0356   VLDL 14 05/01/2013 0356   LDLCALC 53 05/01/2013 0356     Wt Readings from Last 3 Encounters:  08/28/16 223 lb 9.6 oz (101.4 kg)  04/03/16 228 lb (103.4 kg)  12/13/15 230 lb (104.3 kg)      Other studies Reviewed: Additional studies/ records that were reviewed today include: labs   ASSESSMENT AND PLAN:  Ischemic cardiomyopathy  Congestive heart failure-chronic-systolic  Implantable defibrillator-[employer]  Right bundle/left anterior fascicular block  Renal insufficiency grade 3  Hypertension  Hyperlipidemia  Without symptoms of ischemia  Euvolemic continue current meds  No  intercurrent Ventricular tachycardia  BP well controlled  We will check renal function and potassium on his current medications-high risk-Aldactone    F/u with CHF clinic   Current medicines are reviewed at length with the patient today.   The patient does not have concerns regarding his medicines.  The following changes were made today:  none  Labs/ tests ordered today include: As above   No orders of the defined types were placed in this encounter.    Disposition:   FU with AS device clinic  1 year(s)  Signed, [name], MD  08/28/2016 4:45 PM     [address] [place] [address] [phone] (office) [phone] (fax)

## 2016-08-28 NOTE — Patient Instructions (Signed)
Medication Instructions: Your physician recommends that you continue on your current medications as directed. Please refer to the Current Medication list given to you today.   Labwork: Your physician recommends that you have lab work today: BMET   Procedures/Testing: None Ordered  Follow-Up: Your physician recommends that you schedule a follow-up appointment next available with Dr. Aundra Dubin in [place] wants you to follow-up in: 1 YEAR with Chanetta Marshall, NP. You will receive a reminder letter in the mail two months in advance. If you don't receive a letter, please call our office to schedule the follow-up appointment.   Any Additional Special Instructions Will Be Listed Below (If Applicable).     If you need a refill on your cardiac medications before your next appointment, please call your pharmacy.

## 2016-08-29 LAB — BASIC METABOLIC PANEL
BUN / CREAT RATIO: 15 (ref 10–24)
BUN: 22 mg/dL (ref 8–27)
CHLORIDE: 101 mmol/L (ref 96–106)
CO2: 20 mmol/L (ref 18–29)
Calcium: 9.2 mg/dL (ref 8.6–10.2)
Creatinine, Ser: 1.49 mg/dL — ABNORMAL HIGH (ref 0.76–1.27)
GFR calc Af Amer: 56 mL/min/{1.73_m2} — ABNORMAL LOW (ref >59)
GFR calc non Af Amer: 49 mL/min/{1.73_m2} — ABNORMAL LOW (ref >59)
GLUCOSE: 82 mg/dL (ref 65–99)
POTASSIUM: 5.1 mmol/L (ref 3.5–5.2)
SODIUM: 139 mmol/L (ref 134–144)

## 2016-09-01 ENCOUNTER — Telehealth: Payer: Self-pay | Admitting: Internal Medicine

## 2016-09-01 ENCOUNTER — Telehealth: Payer: Self-pay

## 2016-09-01 NOTE — Telephone Encounter (Signed)
Patient is aware and agreeable to normal lab results with stable renal insuffiencey.

## 2016-09-01 NOTE — Telephone Encounter (Signed)
lmtcb for lab results

## 2016-09-01 NOTE — Telephone Encounter (Signed)
Follow Up: ° ° °Returning your call,concerning his lab results. °

## 2016-09-02 ENCOUNTER — Telehealth (HOSPITAL_COMMUNITY): Payer: Self-pay

## 2016-09-02 NOTE — Telephone Encounter (Addendum)
PA for Entresto 24/26 approved by [facility] through 09/03/18.   Belia Heman, PharmD PGY1 Pharmacy Resident [phone] (Pager) 09/02/2016 4:49 PM

## 2016-09-11 DIAGNOSIS — R32 Unspecified urinary incontinence: Secondary | ICD-10-CM | POA: Diagnosis not present

## 2016-09-11 DIAGNOSIS — I509 Heart failure, unspecified: Secondary | ICD-10-CM | POA: Diagnosis not present

## 2016-09-17 DIAGNOSIS — R32 Unspecified urinary incontinence: Secondary | ICD-10-CM | POA: Diagnosis not present

## 2016-09-17 DIAGNOSIS — I509 Heart failure, unspecified: Secondary | ICD-10-CM | POA: Diagnosis not present

## 2016-09-18 ENCOUNTER — Other Ambulatory Visit (HOSPITAL_COMMUNITY): Payer: Self-pay | Admitting: Cardiology

## 2016-10-06 ENCOUNTER — Other Ambulatory Visit: Payer: Self-pay | Admitting: Cardiology

## 2016-10-07 DIAGNOSIS — R32 Unspecified urinary incontinence: Secondary | ICD-10-CM | POA: Diagnosis not present

## 2016-10-07 DIAGNOSIS — I509 Heart failure, unspecified: Secondary | ICD-10-CM | POA: Diagnosis not present

## 2016-10-12 DIAGNOSIS — R32 Unspecified urinary incontinence: Secondary | ICD-10-CM | POA: Diagnosis not present

## 2016-10-12 DIAGNOSIS — I509 Heart failure, unspecified: Secondary | ICD-10-CM | POA: Diagnosis not present

## 2016-10-14 DIAGNOSIS — I509 Heart failure, unspecified: Secondary | ICD-10-CM | POA: Diagnosis not present

## 2016-10-14 DIAGNOSIS — M109 Gout, unspecified: Secondary | ICD-10-CM | POA: Diagnosis not present

## 2016-10-14 DIAGNOSIS — E119 Type 2 diabetes mellitus without complications: Secondary | ICD-10-CM | POA: Diagnosis not present

## 2016-10-14 DIAGNOSIS — Z125 Encounter for screening for malignant neoplasm of prostate: Secondary | ICD-10-CM | POA: Diagnosis not present

## 2016-10-14 DIAGNOSIS — M545 Low back pain: Secondary | ICD-10-CM | POA: Diagnosis not present

## 2016-10-14 DIAGNOSIS — I2510 Atherosclerotic heart disease of native coronary artery without angina pectoris: Secondary | ICD-10-CM | POA: Diagnosis not present

## 2016-10-14 DIAGNOSIS — E669 Obesity, unspecified: Secondary | ICD-10-CM | POA: Diagnosis not present

## 2016-10-14 DIAGNOSIS — J449 Chronic obstructive pulmonary disease, unspecified: Secondary | ICD-10-CM | POA: Diagnosis not present

## 2016-10-14 DIAGNOSIS — E1140 Type 2 diabetes mellitus with diabetic neuropathy, unspecified: Secondary | ICD-10-CM | POA: Diagnosis not present

## 2016-10-14 DIAGNOSIS — I10 Essential (primary) hypertension: Secondary | ICD-10-CM | POA: Diagnosis not present

## 2016-10-23 DIAGNOSIS — I509 Heart failure, unspecified: Secondary | ICD-10-CM | POA: Diagnosis not present

## 2016-10-23 DIAGNOSIS — R32 Unspecified urinary incontinence: Secondary | ICD-10-CM | POA: Diagnosis not present

## 2016-10-29 ENCOUNTER — Ambulatory Visit (HOSPITAL_COMMUNITY)
Admission: RE | Admit: 2016-10-29 | Discharge: 2016-10-29 | Disposition: A | Discharge status: Home / Self Care | Payer: Medicare HMO | Source: Ambulatory Visit | Attending: Internal Medicine | Admitting: Internal Medicine

## 2016-10-29 ENCOUNTER — Encounter (HOSPITAL_COMMUNITY): Payer: Self-pay | Admitting: Internal Medicine

## 2016-10-29 VITALS — BP 112/66 | HR 72 | Ht 67.0 in | Wt 223.0 lb

## 2016-10-29 DIAGNOSIS — I739 Peripheral vascular disease, unspecified: Secondary | ICD-10-CM

## 2016-10-29 DIAGNOSIS — Z79899 Other long term (current) drug therapy: Secondary | ICD-10-CM | POA: Diagnosis not present

## 2016-10-29 DIAGNOSIS — I5082 Biventricular heart failure: Secondary | ICD-10-CM | POA: Insufficient documentation

## 2016-10-29 DIAGNOSIS — Z95810 Presence of automatic (implantable) cardiac defibrillator: Secondary | ICD-10-CM | POA: Diagnosis not present

## 2016-10-29 DIAGNOSIS — I110 Hypertensive heart disease with heart failure: Secondary | ICD-10-CM | POA: Insufficient documentation

## 2016-10-29 DIAGNOSIS — I5022 Chronic systolic (congestive) heart failure: Secondary | ICD-10-CM | POA: Diagnosis not present

## 2016-10-29 DIAGNOSIS — F17210 Nicotine dependence, cigarettes, uncomplicated: Secondary | ICD-10-CM | POA: Diagnosis not present

## 2016-10-29 DIAGNOSIS — I2510 Atherosclerotic heart disease of native coronary artery without angina pectoris: Secondary | ICD-10-CM | POA: Diagnosis not present

## 2016-10-29 DIAGNOSIS — Z951 Presence of aortocoronary bypass graft: Secondary | ICD-10-CM | POA: Diagnosis not present

## 2016-10-29 DIAGNOSIS — F17200 Nicotine dependence, unspecified, uncomplicated: Secondary | ICD-10-CM

## 2016-10-29 DIAGNOSIS — M109 Gout, unspecified: Secondary | ICD-10-CM | POA: Insufficient documentation

## 2016-10-29 DIAGNOSIS — E1151 Type 2 diabetes mellitus with diabetic peripheral angiopathy without gangrene: Secondary | ICD-10-CM | POA: Diagnosis not present

## 2016-10-29 DIAGNOSIS — Z7982 Long term (current) use of aspirin: Secondary | ICD-10-CM | POA: Diagnosis not present

## 2016-10-29 DIAGNOSIS — E785 Hyperlipidemia, unspecified: Secondary | ICD-10-CM | POA: Diagnosis not present

## 2016-10-29 DIAGNOSIS — J449 Chronic obstructive pulmonary disease, unspecified: Secondary | ICD-10-CM | POA: Diagnosis not present

## 2016-10-29 LAB — COMPREHENSIVE METABOLIC PANEL
ALK PHOS: 61 U/L (ref 38–126)
ALT: 17 U/L (ref 17–63)
ANION GAP: 11 (ref 5–15)
AST: 22 U/L (ref 15–41)
Albumin: 3.7 g/dL (ref 3.5–5.0)
BILIRUBIN TOTAL: 0.6 mg/dL (ref 0.3–1.2)
BUN: 47 mg/dL — ABNORMAL HIGH (ref 6–20)
CALCIUM: 9.5 mg/dL (ref 8.9–10.3)
CO2: 20 mmol/L — ABNORMAL LOW (ref 22–32)
Chloride: 104 mmol/L (ref 101–111)
Creatinine, Ser: 2.16 mg/dL — ABNORMAL HIGH (ref 0.61–1.24)
GFR calc Af Amer: 35 mL/min — ABNORMAL LOW (ref >60)
GFR, EST NON AFRICAN AMERICAN: 30 mL/min — AB (ref >60)
GLUCOSE: 87 mg/dL (ref 65–99)
POTASSIUM: 4.7 mmol/L (ref 3.5–5.1)
Sodium: 135 mmol/L (ref 135–145)
TOTAL PROTEIN: 7.7 g/dL (ref 6.5–8.1)

## 2016-10-29 MED ORDER — SACUBITRIL-VALSARTAN 49-51 MG PO TABS: 1.0000 | ORAL_TABLET | Freq: Two times a day (BID) | ORAL | 3 refills | Status: DC

## 2016-10-29 NOTE — Patient Instructions (Signed)
Increase Entresto to 49/51 mg Twice daily   Labs today  You have been referred to [facility]  Your physician recommends that you schedule a follow-up appointment in: 3-4 months

## 2016-10-29 NOTE — Addendum Note (Signed)
Encounter addended by: Scarlette Calico, RN on: 10/29/2016 12:12 PM<BR>    Actions taken: Visit diagnoses modified, Order list changed, Diagnosis association updated, Sign clinical note

## 2016-10-29 NOTE — Progress Notes (Signed)
Patient ID: [name] [name], male   DOB: March 07, 1951, 66 y.o.   MRN: [id number] PCP: [name] [facility]) Cardiac Surgeon: Dr Roxan Hockey Pulmonologist: Dr. Lamonte Sakai Cardiology: Dr. Aundra Dubin  HPI: [name] [name] is a 66 yo with history of COPD, HTN, 3V CAD s/p CABG x 5  (04/2013: LIMA to LAD, SVG to second diagonal, SVG to ramus intermediate and obtuse marginal 2, SVG to posterior descending), ICM, chronic systolic HF, COPD,DM2, PVD and tobacco abuse.   ECHO 06/26/13 EF 20-25% RV mild HK ECHO 10/10/13 EF 20-25% restrictive filling pattern. Moderate RV dysfunction. Mild MR. Cardiolite (10/16) with EF 19%, anterior ischemia.  LHC (10/16) with patent LIMA-LAD and 60% pLAD stenosis (competitive flow). SVG-D, sequential SVG-ramus/OM2, SVG-PDA all patent. Medical management.  Echo 7/17 EF 30-35%  St Jude ICD placed by Dr Caryl Comes.   ABIs 11/07/13 - RABI .63 moderate arterial occlusive dz,  - LABI .78 moderate arterial occlusive dz - R toe BI <.69 abnl - L toe BI < .69 abnl ABIs 9/16 - RABI .68 - LABI 0.75  Patient seen in followup today.  He is typically followed by Dr. Aundra Dubin but has not been compliant with f/u. We haven't seen him in over a year. He had a cath in 10/16 with patent grafts. Says he is doing well. Denies any CP or SOB. Walks almost a mile every day without problem. Just mild claudication calf /tightness which is stable. [facility] with [facility]. Still smoking about 1/2 ppd. Compliant with meds. No orthopnea or PND.  Labs 09/19/13 K 4.2 Creatinine 1.1          11/02/13 K 4.0, Creatinine 1.12          12/08/13 K 3.7 Creatinine 1.01           8/15 K 4.4, creatinine 1.54, LDL 23, HDL 36          10/16 K 4.6, creatinine 1.17, hgb 14.7, LDL 42, HDL 34, TGs 294  ECG: NSR, LAFB, RBBB  SH: Lives with his girlfriend.  Smokes 1/2 ppd. Does not drink alcohol  FH: 2 brothers died from heart attacks, Mother DM, Dad HTN   ROS: All systems negative except as listed in HPI, PMH and Problem List.  Past Medical  History:  Diagnosis Date  . CAD (coronary artery disease)    a. 04/2013 Cath: LM 10, LAD 40p, 11m D1 50p, LCX 60p, 963m42m, OM1 50, OM2 100 L-L collats, RCA 100p;  b. CABG x 5: LIMA->LAD, VG->D2, VG->RI->OM2, VG->PDA.  . Marland Kitchenhronic systolic CHF (congestive heart failure) ([facility]   a. 04/2013 Echo: EF 15-20%, diff HK, sev HK of inf and ant myocardium, dilated LA,  b. EF 20-25% with restrictive filling pattern, mod RV dysfx, mild MR (10/10/2013);  c.  Echo 12/15: EF 25-30%  . COPD (chronic obstructive pulmonary disease) ([facility] [place]  . Diabetes mellitus without complication ([facility]  . Emphysema   . Gout   . Hyperlipidemia   . Hypertension   . Ischemic cardiomyopathy   . PVD (peripheral vascular disease) ([facility] [place]   a. (10/2013) ABIs: RIGHT 0.63, Waveforms: monophasic;  LEFT 0.78, Waveforms: monophasic  . Tobacco abuse    30+ pack-year history  . Transaminitis    a. 04/2013 Abd U/S and CT unremarkable, hepatitis panel neg -->felt to be 2/2 acute R heart failure.    Current Outpatient Prescriptions  Medication Sig Dispense Refill  . albuterol (PROVENTIL HFA;VENTOLIN HFA) 108 (90 BASE) MCG/ACT inhaler Inhale 1 puff into the lungs every  6 (six) hours as needed for wheezing or shortness of breath.    . allopurinol (ZYLOPRIM) 300 MG tablet Take 300 mg by mouth daily.    Marland Kitchen aspirin EC 81 MG tablet TAKE ONE TABLET BY MOUTH ONCE DAILY 30 tablet 3  . atorvastatin (LIPITOR) 20 MG tablet Take 20 mg by mouth daily.    Marland Kitchen b complex vitamins tablet Take 1 tablet by mouth daily.    . budesonide-formoterol (SYMBICORT) 80-4.5 MCG/ACT inhaler Inhale 2 puffs into the lungs 2 (two) times daily. 1 Inhaler 0  . carvedilol (COREG) 3.125 MG tablet TAKE 2 TABS BY MOUTH IN THE AM & 1 TABS BY MOUTH IN THE PM    . colchicine (COLCRYS) 0.6 MG tablet Take 0.6 mg by mouth daily as needed (gout).    . furosemide (LASIX) 20 MG tablet Take 3 tablets (60 mg total) by mouth 2 (two) times daily. TAKE 60 MG BY MOUTH TWICE DAILY (Patient  taking differently: Take 60 mg by mouth daily. Pt taking 3 tablets 60 mg once a day) 180 tablet 3  . gabapentin (NEURONTIN) 300 MG capsule Take 300 mg by mouth 2 (two) times daily.    Marland Kitchen lubiprostone (AMITIZA) 24 MCG capsule Take 24 mcg by mouth 2 (two) times daily with a meal.     . Olopatadine HCl (PATADAY) 0.2 % SOLN Place 1 drop into both eyes 2 (two) times daily.    Marland Kitchen omeprazole (PRILOSEC) 20 MG capsule Take 20 mg by mouth daily.    . potassium chloride SA (K-DUR,KLOR-CON) 20 MEQ tablet Take 1 tablet (20 mEq total) by mouth daily. 30 tablet 0  . sacubitril-valsartan (ENTRESTO) 24-26 MG Take 1 tablet by mouth 2 (two) times daily. 60 tablet 6  . spironolactone (ALDACTONE) 25 MG tablet TAKE ONE TABLET BY MOUTH ONCE DAILY 30 tablet 2  . tiotropium (SPIRIVA) 18 MCG inhalation capsule Place 18 mcg into inhaler and inhale daily.    . traMADol (ULTRAM) 50 MG tablet Take 50 mg by mouth every 6 (six) hours as needed for moderate pain.     No current facility-administered medications for this encounter.     Vitals:   10/29/16 1126  BP: 112/66  Pulse: 72  SpO2: 98%  Weight: 223 lb (101.2 kg)  Height: '5\' 7"'$  (1.702 m)    PHYSICAL EXAM: General:  Well appearing. No resp difficulty Walks with cane HEENT: normal Neck: supple. JVP 5-6 Carotids 2+ bilat; no bruits. No lymphadenopathy or thryomegaly appreciated. Cor: PMI laterally displaced. Regular rate & rhythm. No rubs, gallops or murmurs. Lungs: clear Abdomen: soft, nontender, nondistended. No hepatosplenomegaly. No bruits or masses. Good bowel sounds. Extremities: no cyanosis, clubbing, rash, trace  edema Neuro: alert & orientedx3, cranial nerves grossly intact. moves all 4 extremities w/o difficulty. Affect pleasant   ASSESSMENT & PLAN:  1. CAD s/p CABG:  -- Doing well No s/s of angina. Had cath in 10/16 with patent grafts, medical management.   - Continue ASA 81, statin.  2. Chronic systolic HF: EF 29% 11/07/82 echo.  EF was 19% by  Cardiolite in 10/16. Echo 7/17 30-35%   He has a Research officer, political party ICD. Stable NYHA II symptoms, does not appear volume overloaded. - ICD interrogated personally. Corevue ok. No VT.  - Continue Lasix 60 mg bid.  - Continue current Coreg and spironolactone. - Increase Entresto to 49/51 labs today - Eventually should go on Bidil if BP will tolerate  3. Current Smoker:  - Encouraged to stop smoking  4. PAD:  - ABIs in 9/16 with moderate bilateral arterial occlusive disease. Encouraged to quit smoking and continue medical treatment and walking program.  - Encouraged him to follow-up with [facility]. We will make sure he has an appt,.[facility] follows at [facility].  He is on ASA and statin.   Followup in 3-4 months at CHF clinic   [place], Quillian Quince 10/29/2016

## 2016-11-04 ENCOUNTER — Telehealth (HOSPITAL_COMMUNITY): Payer: Self-pay | Admitting: *Deleted

## 2016-11-04 DIAGNOSIS — I5022 Chronic systolic (congestive) heart failure: Secondary | ICD-10-CM

## 2016-11-04 NOTE — Telephone Encounter (Signed)
Pt aware of lab results.   Notes recorded by Jolaine Artist, MD on 11/01/2016 at 10:39 PM EDT Creatinine up. Lets repeat BMET this week. If still up would hold lasix 2 days then decrease lasix to 40 bid and recheck in another week    Ref Range & Units 6d ago 65moago 132mogo   Sodium 135 - 145 mmol/L 135  139R  138R    Potassium 3.5 - 5.1 mmol/L 4.7  5.1R, CM  4.0R    Chloride 101 - 111 mmol/L 104  101R  106R    CO2 22 - 32 mmol/L 20   20R  23R    Glucose, Bld 65 - 99 mg/dL 87  82CM  101     BUN 6 - 20 mg/dL 47   22R  30R     Creatinine, Ser 0.61 - 1.24 mg/dL 2.16   1.49R   1.35R, CM     Calcium 8.9 - 10.3 mg/dL 9.5  9.2R  9.5R    Total Protein 6.5 - 8.1 g/dL 7.7      Albumin 3.5 - 5.0 g/dL 3.7      AST 15 - 41 U/L 22      ALT 17 - 63 U/L 17      Alkaline Phosphatase 38 - 126 U/L 61      Total Bilirubin 0.3 - 1.2 mg/dL 0.6      GFR calc non Af Amer >60 mL/min 30   49R      GFR calc Af Amer >60 mL/min 35   56R     Comment: (NOTE)

## 2016-11-05 DIAGNOSIS — R32 Unspecified urinary incontinence: Secondary | ICD-10-CM | POA: Diagnosis not present

## 2016-11-05 DIAGNOSIS — I509 Heart failure, unspecified: Secondary | ICD-10-CM | POA: Diagnosis not present

## 2016-11-06 ENCOUNTER — Ambulatory Visit (HOSPITAL_COMMUNITY)
Admission: RE | Admit: 2016-11-06 | Discharge: 2016-11-06 | Disposition: A | Discharge status: Home / Self Care | Payer: Medicare HMO | Source: Ambulatory Visit | Attending: Cardiology | Admitting: Cardiology

## 2016-11-06 DIAGNOSIS — I5022 Chronic systolic (congestive) heart failure: Secondary | ICD-10-CM

## 2016-11-06 LAB — BASIC METABOLIC PANEL
ANION GAP: 7 (ref 5–15)
BUN: 34 mg/dL — ABNORMAL HIGH (ref 6–20)
CHLORIDE: 109 mmol/L (ref 101–111)
CO2: 22 mmol/L (ref 22–32)
Calcium: 9.7 mg/dL (ref 8.9–10.3)
Creatinine, Ser: 2.44 mg/dL — ABNORMAL HIGH (ref 0.61–1.24)
GFR calc non Af Amer: 26 mL/min — ABNORMAL LOW (ref >60)
GFR, EST AFRICAN AMERICAN: 30 mL/min — AB (ref >60)
Glucose, Bld: 92 mg/dL (ref 65–99)
POTASSIUM: 4.8 mmol/L (ref 3.5–5.1)
Sodium: 138 mmol/L (ref 135–145)

## 2016-11-13 ENCOUNTER — Encounter: Payer: Self-pay | Admitting: Sports Medicine

## 2016-11-13 ENCOUNTER — Ambulatory Visit (INDEPENDENT_AMBULATORY_CARE_PROVIDER_SITE_OTHER): Payer: Medicare HMO | Admitting: Sports Medicine

## 2016-11-13 DIAGNOSIS — B351 Tinea unguium: Secondary | ICD-10-CM | POA: Diagnosis not present

## 2016-11-13 DIAGNOSIS — E1140 Type 2 diabetes mellitus with diabetic neuropathy, unspecified: Secondary | ICD-10-CM

## 2016-11-13 DIAGNOSIS — M79676 Pain in unspecified toe(s): Secondary | ICD-10-CM | POA: Diagnosis not present

## 2016-11-13 DIAGNOSIS — I739 Peripheral vascular disease, unspecified: Secondary | ICD-10-CM

## 2016-11-13 NOTE — Progress Notes (Signed)
Patient ID: [name] [name], male   DOB: 1951-02-10, 66 y.o.   MRN: [id number]  Subjective: [name] [name] is a 66 y.o. male patient with history of type 2 diabetes who returns to office today complaining of long, painful nails  while ambulating in shoes, unable to trim. Patient states that the glucose reading this morning was "good".Patient denies any new changes in medication or new problems. Patient denies any new cramping, numbness, burning or tingling in the legs.  Patient Active Problem List   Diagnosis Date Noted  . Chest pain 04/08/2015  . Abnormal nuclear stress test 04/08/2015  . Ischemic cardiomyopathy 07/06/2014  . Lumbago 03/13/2014  . Special screening for malignant neoplasms, colon 02/09/2014  . Normocytic anemia 02/09/2014  . Pleural effusion on left 11/28/2013  . Atherosclerosis of native arteries of extremity with intermittent claudication ([place]) 11/07/2013  . Weakness of both legs 11/07/2013  . Smoker 09/19/2013  . Numbness in right leg/ Foot 06/14/2013  . Pain in limb-Right Leg/foot 06/14/2013  . Atherosclerosis of native arteries of the extremities with ulceration(440.23) 06/14/2013  . Chronic systolic heart failure ([place]) 05/29/2013  . Atrial fibrillation ([place]) 05/29/2013  . Chest pain, mid sternal 05/19/2013  . S/P CABG x 5 05/08/2013  . Right foot ulcer ([place]) 05/04/2013  . Coronary atherosclerosis of native coronary artery 04/28/2013  . Acute systolic heart failure ([place]) 04/28/2013  . Abnormal blood chemistry 12/30/2012  . Abnormal LFTs 12/30/2012  . Aspiration pneumonia ([place]) 12/30/2012  . Chronic congestive heart failure ([place]) 12/27/2012  . Leg weakness 12/26/2012  . Pneumonia 05/18/2011  . Gout 05/17/2011  . Respiratory distress, acute 05/17/2011  . Hypertension 05/17/2011  . COPD (chronic obstructive pulmonary disease) ([place]) 05/17/2011  . CHF, acute ([place]) 05/17/2011   Current Outpatient Prescriptions on File Prior to Visit  Medication Sig Dispense Refill  .  albuterol (PROVENTIL HFA;VENTOLIN HFA) 108 (90 BASE) MCG/ACT inhaler Inhale 1 puff into the lungs every 6 (six) hours as needed for wheezing or shortness of breath.    . allopurinol (ZYLOPRIM) 300 MG tablet Take 300 mg by mouth daily.    Marland Kitchen aspirin EC 81 MG tablet TAKE ONE TABLET BY MOUTH ONCE DAILY 30 tablet 3  . atorvastatin (LIPITOR) 20 MG tablet Take 20 mg by mouth daily.    Marland Kitchen b complex vitamins tablet Take 1 tablet by mouth daily.    . budesonide-formoterol (SYMBICORT) 80-4.5 MCG/ACT inhaler Inhale 2 puffs into the lungs 2 (two) times daily. 1 Inhaler 0  . carvedilol (COREG) 3.125 MG tablet TAKE 2 TABS BY MOUTH IN THE AM & 1 TABS BY MOUTH IN THE PM    . colchicine (COLCRYS) 0.6 MG tablet Take 0.6 mg by mouth daily as needed (gout).    . furosemide (LASIX) 20 MG tablet Take 3 tablets (60 mg total) by mouth 2 (two) times daily. TAKE 60 MG BY MOUTH TWICE DAILY (Patient taking differently: Take 60 mg by mouth daily. Pt taking 3 tablets 60 mg once a day) 180 tablet 3  . gabapentin (NEURONTIN) 300 MG capsule Take 300 mg by mouth 2 (two) times daily.    Marland Kitchen lubiprostone (AMITIZA) 24 MCG capsule Take 24 mcg by mouth 2 (two) times daily with a meal.     . Olopatadine HCl (PATADAY) 0.2 % SOLN Place 1 drop into both eyes 2 (two) times daily.    Marland Kitchen omeprazole (PRILOSEC) 20 MG capsule Take 20 mg by mouth daily.    . potassium chloride SA (K-DUR,KLOR-CON) 20 MEQ tablet  Take 1 tablet (20 mEq total) by mouth daily. 30 tablet 0  . sacubitril-valsartan (ENTRESTO) 49-51 MG Take 1 tablet by mouth 2 (two) times daily. 60 tablet 3  . spironolactone (ALDACTONE) 25 MG tablet TAKE ONE TABLET BY MOUTH ONCE DAILY 30 tablet 2  . tiotropium (SPIRIVA) 18 MCG inhalation capsule Place 18 mcg into inhaler and inhale daily.    . traMADol (ULTRAM) 50 MG tablet Take 50 mg by mouth every 6 (six) hours as needed for moderate pain.     No current facility-administered medications on file prior to visit.    Allergies  Allergen  Reactions  . Penicillins Other (See Comments) and Nausea And Vomiting    Per Dr Audria Nine Via phone 12/27/12 0400 Swelling , long time ago. Doesn't remember   Objective: General: Patient is awake, alert, and oriented x 3 and in no acute distress.  Integument: Skin is warm, dry and supple bilateral. Moderate plantar xerosis bilateral, improving in nature. Nails are tender, long, thickened and dystrophic with subungual debris, consistent with onychomycosis, 1-5 bilateral. No signs of infection. No open lesions or preulcerative lesions present bilateral. Remaining integument unremarkable.  Vasculature:  Dorsalis Pedis pulse 1/4 bilateral. Posterior Tibial pulse  0/4 bilateral.  Capillary fill time <3 sec 1-5 bilateral. Scant hair growth to the level of the digits. Temperature gradient within normal limits. No varicosities present bilateral. Mild trace edema present bilateral ankles.   Neurology: The patient has diminished sensation measured with a 5.07/10g Semmes Weinstein Monofilament at all pedal sites bilateral . Vibratory sensation diminished bilateral with tuning fork. No Babinski sign present bilateral.   Musculoskeletal: No gross pedal deformities noted bilateral. Muscular strength 5/5 in all lower extremity muscular groups bilateral without pain or limitation on range of motion . No tenderness with calf compression bilateral.  Assessment and Plan: Problem List Items Addressed This Visit    None    Visit Diagnoses    Dermatophytosis of nail    -  Primary   Pain of toe, unspecified laterality       Type 2 diabetes, controlled, with neuropathy ([place])       Peripheral vascular disease ([place])         -Examined patient. -Discussed and educated patient on diabetic foot care, especially with regards to the vascular, neurological and musculoskeletal systems.  -Stressed the importance of good glycemic control and the detriment of not controlling glucose levels in relation to the  foot. -Mechanically debrided all nails 1-5 bilateral using sterile nail nipper and filed with dremel without incident  -Cont. with B-vitamin and Gabapentin for neuropathy -Recommend continue with OTC skin emollient for dry skin daily -Recommend continue with good supportive shoes -Answered all patient questions -Patient to return in 3 months for at risk foot care -Patient advised to call the office if any problems or questions arise in the meantime.  Landis Martins, DPM

## 2016-11-17 ENCOUNTER — Other Ambulatory Visit: Payer: Self-pay | Admitting: Internal Medicine

## 2016-11-17 DIAGNOSIS — R32 Unspecified urinary incontinence: Secondary | ICD-10-CM | POA: Diagnosis not present

## 2016-11-17 DIAGNOSIS — I509 Heart failure, unspecified: Secondary | ICD-10-CM | POA: Diagnosis not present

## 2016-11-18 ENCOUNTER — Encounter (HOSPITAL_COMMUNITY): Payer: Self-pay | Admitting: *Deleted

## 2016-11-23 ENCOUNTER — Telehealth (HOSPITAL_COMMUNITY): Payer: Self-pay | Admitting: *Deleted

## 2016-11-23 DIAGNOSIS — I5022 Chronic systolic (congestive) heart failure: Secondary | ICD-10-CM

## 2016-11-23 NOTE — Telephone Encounter (Signed)
Patient called back today regarding lab work.  He is agreeable to come back this week to recheck BMET.  Lab order placed and appointment scheduled.   Basic metabolic panel  Order: [id number]  Status:  Final result Visible to patient:  No (Not Released) Next appt:  11/24/2016 at 09:00 AM in Cardiology ([facility]) Dx:  Chronic systolic heart failure ([place])  Notes recorded by Harvie Junior, CMA on 11/18/2016 at 2:36 PM EDT No answer letter mailed. ------  Notes recorded by Harvie Junior, CMA on 11/17/2016 at 4:12 PM EDT Tried to reach patient and call was disconnected. Called back and left VM ------  Notes recorded by Scarlette Calico, RN on 11/17/2016 at 1:11 PM EDT Left message to call back ------  Notes recorded by Bensimhon, Shaune Pascal, MD on 11/14/2016 at 9:31 PM EDT Creatinine continues to climb despite reduction in diuretics. Lets repeat this week.

## 2016-11-24 ENCOUNTER — Ambulatory Visit (HOSPITAL_COMMUNITY)
Admission: RE | Admit: 2016-11-24 | Discharge: 2016-11-24 | Disposition: A | Discharge status: Home / Self Care | Payer: Medicare HMO | Source: Ambulatory Visit | Attending: Internal Medicine | Admitting: Internal Medicine

## 2016-11-24 DIAGNOSIS — I5022 Chronic systolic (congestive) heart failure: Secondary | ICD-10-CM

## 2016-11-24 LAB — BASIC METABOLIC PANEL
Anion gap: 9 (ref 5–15)
BUN: 32 mg/dL — AB (ref 6–20)
CALCIUM: 9.6 mg/dL (ref 8.9–10.3)
CO2: 23 mmol/L (ref 22–32)
CREATININE: 2.38 mg/dL — AB (ref 0.61–1.24)
Chloride: 106 mmol/L (ref 101–111)
GFR calc Af Amer: 31 mL/min — ABNORMAL LOW (ref >60)
GFR, EST NON AFRICAN AMERICAN: 27 mL/min — AB (ref >60)
GLUCOSE: 87 mg/dL (ref 65–99)
Potassium: 4.2 mmol/L (ref 3.5–5.1)
SODIUM: 138 mmol/L (ref 135–145)

## 2016-11-25 DIAGNOSIS — R32 Unspecified urinary incontinence: Secondary | ICD-10-CM | POA: Diagnosis not present

## 2016-11-25 DIAGNOSIS — I509 Heart failure, unspecified: Secondary | ICD-10-CM | POA: Diagnosis not present

## 2016-11-30 ENCOUNTER — Encounter: Payer: Medicare HMO | Admitting: *Deleted

## 2016-11-30 ENCOUNTER — Telehealth: Payer: Self-pay | Admitting: Cardiology

## 2016-11-30 NOTE — Telephone Encounter (Signed)
Spoke with pt and reminded pt of remote transmission that is due today. Pt verbalized understanding.   

## 2016-12-04 ENCOUNTER — Encounter: Payer: Self-pay | Admitting: Cardiology

## 2016-12-08 DIAGNOSIS — H40003 Preglaucoma, unspecified, bilateral: Secondary | ICD-10-CM | POA: Diagnosis not present

## 2016-12-08 DIAGNOSIS — R32 Unspecified urinary incontinence: Secondary | ICD-10-CM | POA: Diagnosis not present

## 2016-12-08 DIAGNOSIS — I509 Heart failure, unspecified: Secondary | ICD-10-CM | POA: Diagnosis not present

## 2016-12-09 DIAGNOSIS — R32 Unspecified urinary incontinence: Secondary | ICD-10-CM | POA: Diagnosis not present

## 2016-12-09 DIAGNOSIS — I509 Heart failure, unspecified: Secondary | ICD-10-CM | POA: Diagnosis not present

## 2016-12-14 ENCOUNTER — Other Ambulatory Visit (HOSPITAL_COMMUNITY): Payer: Self-pay | Admitting: Cardiology

## 2016-12-14 MED ORDER — SPIRONOLACTONE 25 MG PO TABS: 25.0000 mg | ORAL_TABLET | Freq: Every day | ORAL | 2 refills | Status: DC

## 2016-12-18 ENCOUNTER — Telehealth (HOSPITAL_COMMUNITY): Payer: Self-pay | Admitting: *Deleted

## 2016-12-18 DIAGNOSIS — I5022 Chronic systolic (congestive) heart failure: Secondary | ICD-10-CM

## 2016-12-18 NOTE — Telephone Encounter (Signed)
Per Dr Haroldine Laws pt's bun/cr has been elevated since increasing Entresto in May, he would like pt to have repeat again next week.  Pt aware, bmet sch for 7/5

## 2016-12-22 DIAGNOSIS — R32 Unspecified urinary incontinence: Secondary | ICD-10-CM | POA: Diagnosis not present

## 2016-12-22 DIAGNOSIS — I509 Heart failure, unspecified: Secondary | ICD-10-CM | POA: Diagnosis not present

## 2016-12-24 ENCOUNTER — Ambulatory Visit (HOSPITAL_COMMUNITY)
Admission: RE | Admit: 2016-12-24 | Discharge: 2016-12-24 | Disposition: A | Discharge status: Home / Self Care | Payer: Medicare HMO | Source: Ambulatory Visit | Attending: Internal Medicine | Admitting: Internal Medicine

## 2016-12-24 DIAGNOSIS — I5022 Chronic systolic (congestive) heart failure: Secondary | ICD-10-CM

## 2016-12-24 LAB — BASIC METABOLIC PANEL
ANION GAP: 6 (ref 5–15)
BUN: 28 mg/dL — AB (ref 6–20)
CHLORIDE: 109 mmol/L (ref 101–111)
CO2: 23 mmol/L (ref 22–32)
Calcium: 9.2 mg/dL (ref 8.9–10.3)
Creatinine, Ser: 1.7 mg/dL — ABNORMAL HIGH (ref 0.61–1.24)
GFR calc Af Amer: 47 mL/min — ABNORMAL LOW (ref >60)
GFR calc non Af Amer: 41 mL/min — ABNORMAL LOW (ref >60)
GLUCOSE: 108 mg/dL — AB (ref 65–99)
POTASSIUM: 4.7 mmol/L (ref 3.5–5.1)
Sodium: 138 mmol/L (ref 135–145)

## 2016-12-30 DIAGNOSIS — L409 Psoriasis, unspecified: Secondary | ICD-10-CM | POA: Diagnosis not present

## 2016-12-30 DIAGNOSIS — R1032 Left lower quadrant pain: Secondary | ICD-10-CM | POA: Diagnosis not present

## 2017-01-05 ENCOUNTER — Other Ambulatory Visit (HOSPITAL_COMMUNITY): Payer: Self-pay | Admitting: *Deleted

## 2017-01-05 DIAGNOSIS — I509 Heart failure, unspecified: Secondary | ICD-10-CM | POA: Diagnosis not present

## 2017-01-05 DIAGNOSIS — R32 Unspecified urinary incontinence: Secondary | ICD-10-CM | POA: Diagnosis not present

## 2017-01-05 MED ORDER — FUROSEMIDE 20 MG PO TABS: 60.0000 mg | ORAL_TABLET | Freq: Two times a day (BID) | ORAL | 3 refills | Status: DC

## 2017-01-15 ENCOUNTER — Other Ambulatory Visit (HOSPITAL_COMMUNITY): Payer: Self-pay | Admitting: Cardiology

## 2017-01-15 MED ORDER — SACUBITRIL-VALSARTAN 49-51 MG PO TABS: 1.0000 | ORAL_TABLET | Freq: Two times a day (BID) | ORAL | 3 refills | Status: DC

## 2017-01-20 DIAGNOSIS — R32 Unspecified urinary incontinence: Secondary | ICD-10-CM | POA: Diagnosis not present

## 2017-01-20 DIAGNOSIS — I509 Heart failure, unspecified: Secondary | ICD-10-CM | POA: Diagnosis not present

## 2017-01-29 ENCOUNTER — Ambulatory Visit (HOSPITAL_COMMUNITY)
Admission: RE | Admit: 2017-01-29 | Discharge: 2017-01-29 | Disposition: A | Discharge status: Home / Self Care | Payer: Medicare HMO | Source: Ambulatory Visit | Attending: Internal Medicine | Admitting: Internal Medicine

## 2017-01-29 ENCOUNTER — Encounter (HOSPITAL_COMMUNITY): Payer: Self-pay | Admitting: Internal Medicine

## 2017-01-29 ENCOUNTER — Telehealth (HOSPITAL_COMMUNITY): Payer: Self-pay

## 2017-01-29 VITALS — BP 110/70 | HR 88 | Wt 217.8 lb

## 2017-01-29 DIAGNOSIS — I2510 Atherosclerotic heart disease of native coronary artery without angina pectoris: Secondary | ICD-10-CM | POA: Diagnosis not present

## 2017-01-29 DIAGNOSIS — I5022 Chronic systolic (congestive) heart failure: Secondary | ICD-10-CM | POA: Insufficient documentation

## 2017-01-29 DIAGNOSIS — Z79899 Other long term (current) drug therapy: Secondary | ICD-10-CM | POA: Insufficient documentation

## 2017-01-29 DIAGNOSIS — F17210 Nicotine dependence, cigarettes, uncomplicated: Secondary | ICD-10-CM | POA: Diagnosis not present

## 2017-01-29 DIAGNOSIS — Z5189 Encounter for other specified aftercare: Secondary | ICD-10-CM | POA: Insufficient documentation

## 2017-01-29 DIAGNOSIS — Z833 Family history of diabetes mellitus: Secondary | ICD-10-CM | POA: Diagnosis not present

## 2017-01-29 DIAGNOSIS — I739 Peripheral vascular disease, unspecified: Secondary | ICD-10-CM

## 2017-01-29 DIAGNOSIS — I5082 Biventricular heart failure: Secondary | ICD-10-CM | POA: Diagnosis not present

## 2017-01-29 DIAGNOSIS — Z79891 Long term (current) use of opiate analgesic: Secondary | ICD-10-CM | POA: Diagnosis not present

## 2017-01-29 DIAGNOSIS — J449 Chronic obstructive pulmonary disease, unspecified: Secondary | ICD-10-CM | POA: Insufficient documentation

## 2017-01-29 DIAGNOSIS — Z8249 Family history of ischemic heart disease and other diseases of the circulatory system: Secondary | ICD-10-CM | POA: Diagnosis not present

## 2017-01-29 DIAGNOSIS — I110 Hypertensive heart disease with heart failure: Secondary | ICD-10-CM | POA: Diagnosis not present

## 2017-01-29 DIAGNOSIS — Z951 Presence of aortocoronary bypass graft: Secondary | ICD-10-CM | POA: Insufficient documentation

## 2017-01-29 DIAGNOSIS — I25119 Atherosclerotic heart disease of native coronary artery with unspecified angina pectoris: Secondary | ICD-10-CM | POA: Insufficient documentation

## 2017-01-29 DIAGNOSIS — F17200 Nicotine dependence, unspecified, uncomplicated: Secondary | ICD-10-CM

## 2017-01-29 LAB — BASIC METABOLIC PANEL
Anion gap: 8 (ref 5–15)
BUN: 44 mg/dL — AB (ref 6–20)
CHLORIDE: 108 mmol/L (ref 101–111)
CO2: 23 mmol/L (ref 22–32)
Calcium: 9.4 mg/dL (ref 8.9–10.3)
Creatinine, Ser: 2.59 mg/dL — ABNORMAL HIGH (ref 0.61–1.24)
GFR calc Af Amer: 28 mL/min — ABNORMAL LOW (ref >60)
GFR, EST NON AFRICAN AMERICAN: 24 mL/min — AB (ref >60)
GLUCOSE: 90 mg/dL (ref 65–99)
POTASSIUM: 4.4 mmol/L (ref 3.5–5.1)
Sodium: 139 mmol/L (ref 135–145)

## 2017-01-29 MED ORDER — FUROSEMIDE 20 MG PO TABS: 60.0000 mg | ORAL_TABLET | Freq: Every day | ORAL | 3 refills | Status: DC

## 2017-01-29 NOTE — Addendum Note (Signed)
Encounter addended by: Scarlette Calico, RN on: 01/29/2017 12:00 PM<BR>    Actions taken: Diagnosis association updated, Order list changed, Sign clinical note

## 2017-01-29 NOTE — Progress Notes (Signed)
Patient ID: [name] [name], male   DOB: 09/12/1950, 66 y.o.   MRN: [id number] PCP: [name] [name] [facility]) Cardiac Surgeon: Dr [name] [name] Pulmonologist: Dr. Lamonte [name] Cardiology: Dr. Aundra [name]  HPI: [name] [name] is a 66 yo with history of COPD, HTN, 3V CAD s/p CABG x 5  (04/2013: LIMA to LAD, SVG to second diagonal, SVG to ramus intermediate and obtuse marginal 2, SVG to posterior descending), ICM, chronic systolic HF, COPD,DM2, PVD and tobacco abuse.   ECHO 06/26/13 EF 20-25% RV mild HK ECHO 10/10/13 EF 20-25% restrictive filling pattern. Moderate RV dysfunction. Mild MR. Cardiolite (10/16) with EF 19%, anterior ischemia.  LHC (10/16) with patent LIMA-LAD and 60% pLAD stenosis (competitive flow). SVG-D, sequential SVG-ramus/OM2, SVG-PDA all patent. Medical management.  Echo 7/17 EF 30-35%  St Jude ICD placed by Dr [name] [name].   ABIs 11/07/13 - RABI .63 moderate arterial occlusive dz,  - LABI .78 moderate arterial occlusive dz - R toe BI <.69 abnl - L toe BI < .69 abnl ABIs 9/16 - RABI .68 - LABI 0.75  Patient seen in followup today.  He had a cath in 10/16 with patent grafts.  At last visit Entresto increased to 49/51 bid. Says he is doing well. Says he is walking almost a mile every day with his cane. Walks to the store to weigh himself. Weight down 6 pounds. Denies SOB or CP. Denies any CP or SOB. Continues with mild claudication calf /tightness which is stable. [facility] with [facility]. Still smoking almost 1/2 ppd. Compliant with meds. No orthopnea or PND. Says if he gets up too quickly he will get dizzy at times. No syncope.   ICD interrogated personally. Corevue ok. No VT.   Labs 09/19/13 K 4.2 Creatinine 1.1          11/02/13 K 4.0, Creatinine 1.12          12/08/13 K 3.7 Creatinine 1.01           8/15 K 4.4, creatinine 1.54, LDL 23, HDL 36          10/16 K 4.6, creatinine 1.17, hgb 14.7, LDL 42, HDL 34, TGs 294  ECG: NSR, LAFB, RBBB  SH: Lives with his girlfriend.  Smokes 1/2 ppd. Does not drink  alcohol  FH: 2 brothers died from heart attacks, Mother DM, Dad HTN   ROS: All systems negative except as listed in HPI, PMH and Problem List.  Past Medical History:  Diagnosis Date  . CAD (coronary artery disease)    a. 04/2013 Cath: LM 10, LAD 40p, 80m, D1 50p, LCX 60p, 48m/100m, OM1 50, OM2 100 L-L collats, RCA 100p;  b. CABG x 5: LIMA->LAD, VG->D2, VG->RI->OM2, VG->PDA.  Marland Kitchen Chronic systolic CHF (congestive heart failure) ([place])    a. 04/2013 Echo: EF 15-20%, diff HK, sev HK of inf and ant myocardium, dilated LA,  b. EF 20-25% with restrictive filling pattern, mod RV dysfx, mild MR (10/10/2013);  c.  Echo 12/15: EF 25-30%  . COPD (chronic obstructive pulmonary disease) ([place])   . Diabetes mellitus without complication ([place])   . Emphysema   . Gout   . Hyperlipidemia   . Hypertension   . Ischemic cardiomyopathy   . PVD (peripheral vascular disease) ([place])    a. (10/2013) ABIs: RIGHT 0.63, Waveforms: monophasic;  LEFT 0.78, Waveforms: monophasic  . Tobacco abuse    30+ pack-year history  . Transaminitis    a. 04/2013 Abd U/S and CT unremarkable, hepatitis panel neg -->felt to be 2/2 acute  R heart failure.    Current Outpatient Prescriptions  Medication Sig Dispense Refill  . albuterol (PROVENTIL HFA;VENTOLIN HFA) 108 (90 BASE) MCG/ACT inhaler Inhale 1 puff into the lungs every 6 (six) hours as needed for wheezing or shortness of breath.    . allopurinol (ZYLOPRIM) 300 MG tablet Take 300 mg by mouth daily.    Marland Kitchen aspirin EC 81 MG tablet TAKE ONE TABLET BY MOUTH ONCE DAILY 30 tablet 3  . atorvastatin (LIPITOR) 20 MG tablet Take 20 mg by mouth daily.    Marland Kitchen b complex vitamins tablet Take 1 tablet by mouth daily.    . budesonide-formoterol (SYMBICORT) 80-4.5 MCG/ACT inhaler Inhale 2 puffs into the lungs 2 (two) times daily. 1 Inhaler 0  . carvedilol (COREG) 3.125 MG tablet TAKE 2 TABS BY MOUTH IN THE AM & 1 TABS BY MOUTH IN THE PM    . furosemide (LASIX) 20 MG tablet Take 3 tablets (60 mg  total) by mouth 2 (two) times daily. 180 tablet 3  . gabapentin (NEURONTIN) 300 MG capsule Take 300 mg by mouth 2 (two) times daily.    Marland Kitchen lubiprostone (AMITIZA) 24 MCG capsule Take 24 mcg by mouth 2 (two) times daily with a meal.     . Olopatadine HCl (PATADAY) 0.2 % SOLN Place 1 drop into both eyes 2 (two) times daily.    Marland Kitchen omeprazole (PRILOSEC) 20 MG capsule Take 20 mg by mouth daily.    . potassium chloride SA (K-DUR,KLOR-CON) 20 MEQ tablet Take 1 tablet (20 mEq total) by mouth daily. 30 tablet 0  . sacubitril-valsartan (ENTRESTO) 49-51 MG Take 1 tablet by mouth 2 (two) times daily. 60 tablet 3  . spironolactone (ALDACTONE) 25 MG tablet Take 1 tablet (25 mg total) by mouth daily. 90 tablet 2  . tiotropium (SPIRIVA) 18 MCG inhalation capsule Place 18 mcg into inhaler and inhale daily.    . traMADol (ULTRAM) 50 MG tablet Take 50 mg by mouth every 6 (six) hours as needed for moderate pain.    Marland Kitchen colchicine (COLCRYS) 0.6 MG tablet Take 0.6 mg by mouth daily as needed (gout).     No current facility-administered medications for this encounter.     Vitals:   01/29/17 1117  BP: 110/70  Pulse: 88  SpO2: 98%  Weight: 217 lb 12.8 oz (98.8 kg)    Filed Weights   01/29/17 1117  Weight: 217 lb 12.8 oz (98.8 kg)   PHYSICAL EXAM: General:  Well appearing. No resp difficulty Walks with cane HEENT: normal Neck: supple. no JVD. Carotids 2+ bilat; no bruits. No lymphadenopathy or thryomegaly appreciated. Cor: PMI laterally displaced. Regular rate & rhythm. No rubs, gallops or murmurs. Lungs: clear Abdomen: soft, nontender, nondistended. No hepatosplenomegaly. No bruits or masses. Good bowel sounds. Extremities: no cyanosis, clubbing, rash, edema Neuro: alert & orientedx3, cranial nerves grossly intact. moves all 4 extremities w/o difficulty. Affect pleasant   ASSESSMENT & PLAN:  1. CAD s/p CABG:  -- No /s angina. Had cath in 10/16 with patent grafts, medical management.   - Continue ASA 81  and statin.  2. Chronic systolic HF: EF 65% 07/11/39 echo.  EF was 19% by Cardiolite in 10/16. Echo 7/17 30-35%   He has a [employer] officer, political party ICD.  - Stable NYHA II symptoms. - Volume status down with increased Entresto dose. Will cut lasix from 60 bid to 60 daily. Can take afternoon dose as needed, - ICD interrogated personally. Corevue ok. No VT.  - Continue current  Coreg and spironolactone. - Continue Entresto to 49/51  - Check labs today - Eventually consider Bidil if BP will tolerate  3. Current Smoker:  - Encouraged to stop smoking 4. PAD:  - Has moderate claudication - ABIs in 9/16 with moderate bilateral arterial occlusive disease.  - Encouraged him to follow-up with [facility] again. He is on ASA and statin.  - Encouraged to quit smoking and continue medical treatment and walking program.     Glori Bickers 01/29/2017

## 2017-01-29 NOTE — Patient Instructions (Signed)
Decrease Furosemide to 60 mg DAILY  Lab today  Your physician recommends that you schedule a follow-up appointment in: 3-4 months

## 2017-01-29 NOTE — Addendum Note (Signed)
Encounter addended by: Effie Berkshire, RN on: 01/29/2017  1:35 PM<BR>    Actions taken: Pharmacy for encounter modified, Order list changed

## 2017-01-29 NOTE — Addendum Note (Signed)
Encounter addended by: Jolaine Artist, MD on: 01/29/2017 11:40 PM<BR>    Actions taken: Charge Capture section accepted

## 2017-02-02 NOTE — Telephone Encounter (Signed)
error 

## 2017-02-04 ENCOUNTER — Other Ambulatory Visit (HOSPITAL_COMMUNITY): Payer: Self-pay | Admitting: Cardiology

## 2017-02-04 DIAGNOSIS — I509 Heart failure, unspecified: Secondary | ICD-10-CM | POA: Diagnosis not present

## 2017-02-04 DIAGNOSIS — R32 Unspecified urinary incontinence: Secondary | ICD-10-CM | POA: Diagnosis not present

## 2017-02-04 MED ORDER — SACUBITRIL-VALSARTAN 49-51 MG PO TABS: 1.0000 | ORAL_TABLET | Freq: Two times a day (BID) | ORAL | 3 refills | Status: DC

## 2017-02-10 ENCOUNTER — Other Ambulatory Visit: Payer: Self-pay | Admitting: Internal Medicine

## 2017-02-12 ENCOUNTER — Ambulatory Visit (INDEPENDENT_AMBULATORY_CARE_PROVIDER_SITE_OTHER): Payer: Medicare HMO | Admitting: Sports Medicine

## 2017-02-12 DIAGNOSIS — L853 Xerosis cutis: Secondary | ICD-10-CM

## 2017-02-12 DIAGNOSIS — B351 Tinea unguium: Secondary | ICD-10-CM

## 2017-02-12 DIAGNOSIS — E1140 Type 2 diabetes mellitus with diabetic neuropathy, unspecified: Secondary | ICD-10-CM

## 2017-02-12 DIAGNOSIS — M79676 Pain in unspecified toe(s): Secondary | ICD-10-CM

## 2017-02-12 DIAGNOSIS — F17200 Nicotine dependence, unspecified, uncomplicated: Secondary | ICD-10-CM

## 2017-02-12 DIAGNOSIS — I739 Peripheral vascular disease, unspecified: Secondary | ICD-10-CM

## 2017-02-12 NOTE — Progress Notes (Signed)
Patient ID: [name] [name], male   DOB: 10/21/1950, 66 y.o.   MRN: [id number]  Subjective: [name] [name] is a 66 y.o. male patient with history of type 2 diabetes who returns to office today complaining of long, painful nails  while ambulating in shoes, unable to trim. Patient states that the glucose reading this morning was "good", yesterday was 108. Patient denies any new changes in medication or new problems.  Last saw PCP 1 month ago.  Patient Active Problem List   Diagnosis Date Noted  . Chest pain 04/08/2015  . Abnormal nuclear stress test 04/08/2015  . Ischemic cardiomyopathy 07/06/2014  . Lumbago 03/13/2014  . Special screening for malignant neoplasms, colon 02/09/2014  . Normocytic anemia 02/09/2014  . Pleural effusion on left 11/28/2013  . Atherosclerosis of native arteries of extremity with intermittent claudication ([place]) 11/07/2013  . Weakness of both legs 11/07/2013  . Smoker 09/19/2013  . Numbness in right leg/ Foot 06/14/2013  . Pain in limb-Right Leg/foot 06/14/2013  . Atherosclerosis of native arteries of the extremities with ulceration(440.23) 06/14/2013  . Chronic systolic heart failure ([place]) 05/29/2013  . Atrial fibrillation ([place]) 05/29/2013  . Chest pain, mid sternal 05/19/2013  . S/P CABG x 5 05/08/2013  . Right foot ulcer ([place]) 05/04/2013  . Coronary atherosclerosis of native coronary artery 04/28/2013  . Acute systolic heart failure ([place]) 04/28/2013  . Abnormal blood chemistry 12/30/2012  . Abnormal LFTs 12/30/2012  . Aspiration pneumonia ([place]) 12/30/2012  . Chronic congestive heart failure ([place]) 12/27/2012  . Leg weakness 12/26/2012  . Pneumonia 05/18/2011  . Gout 05/17/2011  . Respiratory distress, acute 05/17/2011  . Hypertension 05/17/2011  . COPD (chronic obstructive pulmonary disease) ([place]) 05/17/2011  . CHF, acute ([place]) 05/17/2011   Current Outpatient Prescriptions on File Prior to Visit  Medication Sig Dispense Refill  . albuterol (PROVENTIL  HFA;VENTOLIN HFA) 108 (90 BASE) MCG/ACT inhaler Inhale 1 puff into the lungs every 6 (six) hours as needed for wheezing or shortness of breath.    . allopurinol (ZYLOPRIM) 300 MG tablet Take 300 mg by mouth daily.    Marland Kitchen aspirin EC 81 MG tablet TAKE ONE TABLET BY MOUTH ONCE DAILY 30 tablet 3  . atorvastatin (LIPITOR) 20 MG tablet Take 20 mg by mouth daily.    Marland Kitchen b complex vitamins tablet Take 1 tablet by mouth daily.    . budesonide-formoterol (SYMBICORT) 80-4.5 MCG/ACT inhaler Inhale 2 puffs into the lungs 2 (two) times daily. 1 Inhaler 0  . carvedilol (COREG) 3.125 MG tablet TAKE 2 TABS BY MOUTH IN THE AM & 1 TABS BY MOUTH IN THE PM    . colchicine (COLCRYS) 0.6 MG tablet Take 0.6 mg by mouth daily as needed (gout).    . furosemide (LASIX) 20 MG tablet Take 3 tablets (60 mg total) by mouth daily. 90 tablet 3  . gabapentin (NEURONTIN) 300 MG capsule Take 300 mg by mouth 2 (two) times daily.    Marland Kitchen lubiprostone (AMITIZA) 24 MCG capsule Take 24 mcg by mouth 2 (two) times daily with a meal.     . Olopatadine HCl (PATADAY) 0.2 % SOLN Place 1 drop into both eyes 2 (two) times daily.    Marland Kitchen omeprazole (PRILOSEC) 20 MG capsule Take 20 mg by mouth daily.    . potassium chloride SA (K-DUR,KLOR-CON) 20 MEQ tablet Take 1 tablet (20 mEq total) by mouth daily. 30 tablet 0  . sacubitril-valsartan (ENTRESTO) 49-51 MG Take 1 tablet by mouth 2 (two) times daily. 60 tablet  3  . spironolactone (ALDACTONE) 25 MG tablet Take 1 tablet (25 mg total) by mouth daily. 90 tablet 2  . tiotropium (SPIRIVA) 18 MCG inhalation capsule Place 18 mcg into inhaler and inhale daily.    . traMADol (ULTRAM) 50 MG tablet Take 50 mg by mouth every 6 (six) hours as needed for moderate pain.     No current facility-administered medications on file prior to visit.    Allergies  Allergen Reactions  . Penicillins Other (See Comments) and Nausea And Vomiting    Per Dr Audria Nine Via phone 12/27/12 0400 Swelling , long time ago. Doesn't  remember   Objective: General: Patient is awake, alert, and oriented x 3 and in no acute distress.  Integument: Skin is warm, dry and supple bilateral. Continued Moderate plantar>dorsal xerosis bilateral, improving in nature. Nails are tender, long, thickened and dystrophic with subungual debris, consistent with onychomycosis, 1-5 bilateral. No signs of infection. No open lesions or preulcerative lesions present bilateral. Remaining integument unremarkable.  Vasculature:  Dorsalis Pedis pulse 1/4 bilateral. Posterior Tibial pulse  0/4 bilateral.  Capillary fill time <3 sec 1-5 bilateral. Scant hair growth to the level of the digits. Temperature gradient within normal limits. No varicosities present bilateral. Mild trace edema present bilateral ankles.   Neurology: The patient has diminished sensation measured with a 5.07/10g Semmes Weinstein Monofilament at all pedal sites bilateral . Vibratory sensation diminished bilateral with tuning fork. No Babinski sign present bilateral.   Musculoskeletal: No gross pedal deformities noted bilateral. Muscular strength 5/5 in all lower extremity muscular groups bilateral without pain or limitation on range of motion. No tenderness with calf compression bilateral.  Assessment and Plan: Problem List Items Addressed This Visit      Other   Smoker    Other Visit Diagnoses    Dermatophytosis of nail    -  Primary   Pain of toe, unspecified laterality       Type 2 diabetes, controlled, with neuropathy ([place])       Peripheral vascular disease ([place])       Xerosis of skin         -Examined patient. -Discussed and educated patient on diabetic foot care, especially with regards to the vascular, neurological and musculoskeletal systems.  -Stressed the importance of good glycemic control and the detriment of not controlling glucose levels in relation to the foot. -Mechanically debrided all nails 1-5 bilateral using sterile nail nipper and filed with dremel  without incident  -Cont. with B-vitamin and Gabapentin for neuropathy -Recommend continue with OTC skin emollient for dry skin daily -Recommend continue with good supportive shoes -Encouraged smoking cessation; patient continues to decline  -Answered all patient questions -Patient to return in 3 months for at risk foot care -Patient advised to call the office if any problems or questions arise in the meantime.  Landis Martins, DPM

## 2017-02-22 DIAGNOSIS — I509 Heart failure, unspecified: Secondary | ICD-10-CM | POA: Diagnosis not present

## 2017-02-22 DIAGNOSIS — R32 Unspecified urinary incontinence: Secondary | ICD-10-CM | POA: Diagnosis not present

## 2017-03-03 DIAGNOSIS — R32 Unspecified urinary incontinence: Secondary | ICD-10-CM | POA: Diagnosis not present

## 2017-03-03 DIAGNOSIS — I509 Heart failure, unspecified: Secondary | ICD-10-CM | POA: Diagnosis not present

## 2017-03-10 DIAGNOSIS — I509 Heart failure, unspecified: Secondary | ICD-10-CM | POA: Diagnosis not present

## 2017-03-10 DIAGNOSIS — R32 Unspecified urinary incontinence: Secondary | ICD-10-CM | POA: Diagnosis not present

## 2017-03-17 DIAGNOSIS — S79912A Unspecified injury of left hip, initial encounter: Secondary | ICD-10-CM | POA: Diagnosis not present

## 2017-03-17 DIAGNOSIS — Z9181 History of falling: Secondary | ICD-10-CM | POA: Diagnosis not present

## 2017-03-17 DIAGNOSIS — Z1211 Encounter for screening for malignant neoplasm of colon: Secondary | ICD-10-CM | POA: Diagnosis not present

## 2017-03-17 DIAGNOSIS — M25551 Pain in right hip: Secondary | ICD-10-CM | POA: Diagnosis not present

## 2017-03-17 DIAGNOSIS — Z6833 Body mass index (BMI) 33.0-33.9, adult: Secondary | ICD-10-CM | POA: Diagnosis not present

## 2017-03-17 DIAGNOSIS — Z23 Encounter for immunization: Secondary | ICD-10-CM | POA: Diagnosis not present

## 2017-03-25 DIAGNOSIS — I509 Heart failure, unspecified: Secondary | ICD-10-CM | POA: Diagnosis not present

## 2017-03-25 DIAGNOSIS — R32 Unspecified urinary incontinence: Secondary | ICD-10-CM | POA: Diagnosis not present

## 2017-04-06 DIAGNOSIS — Z1211 Encounter for screening for malignant neoplasm of colon: Secondary | ICD-10-CM | POA: Diagnosis not present

## 2017-04-06 DIAGNOSIS — Z1212 Encounter for screening for malignant neoplasm of rectum: Secondary | ICD-10-CM | POA: Diagnosis not present

## 2017-04-07 ENCOUNTER — Ambulatory Visit: Payer: Medicare HMO | Admitting: Family

## 2017-04-07 ENCOUNTER — Encounter (HOSPITAL_COMMUNITY): Payer: Self-pay

## 2017-04-14 ENCOUNTER — Ambulatory Visit (HOSPITAL_COMMUNITY)
Admission: RE | Admit: 2017-04-14 | Discharge: 2017-04-14 | Disposition: A | Discharge status: Home / Self Care | Payer: Medicare HMO | Source: Ambulatory Visit | Attending: Family | Admitting: Family

## 2017-04-14 ENCOUNTER — Encounter: Payer: Self-pay | Admitting: Family

## 2017-04-14 ENCOUNTER — Ambulatory Visit (INDEPENDENT_AMBULATORY_CARE_PROVIDER_SITE_OTHER): Payer: Medicare HMO | Admitting: Family

## 2017-04-14 VITALS — BP 107/66 | HR 76 | Temp 97.3°F | Resp 20 | Ht 67.0 in | Wt 213.1 lb

## 2017-04-14 DIAGNOSIS — I70213 Atherosclerosis of native arteries of extremities with intermittent claudication, bilateral legs: Secondary | ICD-10-CM | POA: Insufficient documentation

## 2017-04-14 DIAGNOSIS — F17200 Nicotine dependence, unspecified, uncomplicated: Secondary | ICD-10-CM

## 2017-04-14 NOTE — Progress Notes (Signed)
[facility]   CC: Follow up peripheral artery occlusive disease  History of Present Illness [name] [name] is a 66 y.o. male patient of Dr. Scot Dock who developed a blister on both feet approximately Sept., 2014. The blister on the left foot healed. He was having a persistent wound in the right foot and was referred for vascular consultation. He did not remember any specific injury to the foot. Dr. Scot Dock did not get any clear-cut history of claudication although it sounded like his activity was fairly limited. He did describe some rest pain in the right foot. However, this seems to be improving as the wound on his foot has healed. He does have a history of tobacco use but is trying to quit currently.  At his Dec., 2014 visit with Dr. 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 However, the wound on the dorsum of his right foot had healed at that point. His symptoms in the right foot were improving. For this reason Dr. Scot Dock held off on arteriography. He has had veins taken from both legs for his 5 vessel CABG in November, 2014. Based on his duplex, it is unlikely that he would have disease amenable to angioplasty on the right as the disease is fairly diffuse.  He has pain at posterior aspect of both calves after walking almost a mile, relieved by rest, he denies any recurring wounds, the past wounds on his right foot have healed.  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  He has CAD, denies any history of stroke or TIA.   Pt has not had previous intervention for PAD .   07/06/14 had ICD implanted, sees Dr. Caryl Comes, electrophysiologist.  Pt Diabetic: Yes, 6.0 A1C in April 2018 (review of records his PCP) Pt smoker: smoker (2/3 ppd, started smoking at age 30 yrs), he also gets  second hand smoke from his girlfriend who smokes in the house  Pt meds include:  Statin :Yes Betablocker: Yes ASA: Yes Other anticoagulants/antiplatelets: no    Past Medical History:  Diagnosis Date  . CAD (coronary artery disease)    a. 04/2013 Cath: LM 10, LAD 40p, 1m, D1 50p, LCX 60p, 85m/100m, OM1 50, OM2 100 L-L collats, RCA 100p;  b. CABG x 5: LIMA->LAD, VG->D2, VG->RI->OM2, VG->PDA.  Marland Kitchen Chronic systolic CHF (congestive heart failure) ([place])    a. 04/2013 Echo: EF 15-20%, diff HK, sev HK of inf and ant myocardium, dilated LA,  b. EF 20-25% with restrictive filling pattern, mod RV dysfx, mild MR (10/10/2013);  c.  Echo 12/15: EF 25-30%  . COPD (chronic obstructive pulmonary disease) ([place])   . Diabetes mellitus without complication ([place])   . Emphysema   . Gout   . Hyperlipidemia   . Hypertension   . Ischemic cardiomyopathy   . PVD (peripheral vascular disease) ([place])    a. (10/2013) ABIs: RIGHT 0.63, Waveforms: monophasic;  LEFT 0.78, Waveforms: monophasic  . Tobacco abuse    30+ pack-year history  . Transaminitis    a. 04/2013 Abd U/S and CT unremarkable, hepatitis panel neg -->felt to be 2/2 acute R heart failure.    Social History Social History  Substance Use Topics  . Smoking status: Current Every Day Smoker    Packs/day: 1.00    Years: 30.00    Types: Cigarettes  .  Smokeless tobacco: Never Used     Comment: 1 pk per day  . Alcohol use No    Family History Family History  Problem Relation Age of Onset  . Diabetes Mother   . Hypertension Unknown   . Heart attack Unknown        Brothers  . Stroke Neg Hx     Past Surgical History:  Procedure Laterality Date  . APPENDECTOMY  as a child  . CARDIAC CATHETERIZATION  04/08/2015   Procedure: Left Heart Cath and Cors/Grafts Angiography;  Surgeon: Peter M Martinique, MD;  Location: [facility];  Service: Cardiovascular;;  . CORONARY ARTERY BYPASS GRAFT N/A 05/05/2013   Procedure: CORONARY ARTERY BYPASS  GRAFTING (CABG) TIMES FIVE USING LEFT INTERNAL MAMMARY ARTERY AND RIGHT AND LEFT SAPHENOUS LEG VEIN HARVESTED ENDOSCOPICALLY;  Surgeon: Melrose Nakayama, MD;  Location: [facility];  Service: Open Heart Surgery;  Laterality: N/A;  . IMPLANTABLE CARDIOVERTER DEFIBRILLATOR IMPLANT N/A 07/06/2014   Procedure: IMPLANTABLE CARDIOVERTER DEFIBRILLATOR IMPLANT;  Surgeon: Deboraha Sprang, MD;  Location: [facility] C[facility] LAB;  Service: Cardiovascular;  Laterality: N/A;  . LEFT AND RIGHT HEART CATHETERIZATION WITH CORONARY ANGIOGRAM N/A 04/28/2013   Procedure: LEFT AND RIGHT HEART CATHETERIZATION WITH CORONARY ANGIOGRAM;  Surgeon: Burnell Blanks, MD;  Location: [facility] CATH LAB;  Service: Cardiovascular;  Laterality: N/A;  . MULTIPLE EXTRACTIONS WITH ALVEOLOPLASTY N/A 05/03/2013   Procedure: Extraction of tooth #s 3,4,5,6,8,9,27 with alveoloplast and maxillary left osseous tuberosity reduction;  Surgeon: Lenn Cal, DDS;  Location: [facility];  Service: Oral Surgery;  Laterality: N/A;    Allergies  Allergen Reactions  . Penicillins Other (See Comments) and Nausea And Vomiting    Per Dr Audria Nine Via phone 12/27/12 0400 Swelling , long time ago. Doesn't remember    Current Outpatient Prescriptions  Medication Sig Dispense Refill  . albuterol (PROVENTIL HFA;VENTOLIN HFA) 108 (90 BASE) MCG/ACT inhaler Inhale 1 puff into the lungs every 6 (six) hours as needed for wheezing or shortness of breath.    . allopurinol (ZYLOPRIM) 300 MG tablet Take 300 mg by mouth daily.    Marland Kitchen aspirin EC 81 MG tablet TAKE ONE TABLET BY MOUTH ONCE DAILY 30 tablet 3  . atorvastatin (LIPITOR) 20 MG tablet Take 20 mg by mouth daily.    Marland Kitchen b complex vitamins tablet Take 1 tablet by mouth daily.    . budesonide-formoterol (SYMBICORT) 80-4.5 MCG/ACT inhaler Inhale 2 puffs into the lungs 2 (two) times daily. 1 Inhaler 0  . carvedilol (COREG) 3.125 MG tablet TAKE 2 TABS BY MOUTH IN THE AM & 1 TABS BY MOUTH IN THE PM    . colchicine (COLCRYS)  0.6 MG tablet Take 0.6 mg by mouth daily as needed (gout).    . furosemide (LASIX) 20 MG tablet Take 3 tablets (60 mg total) by mouth daily. 90 tablet 3  . gabapentin (NEURONTIN) 300 MG capsule Take 300 mg by mouth 2 (two) times daily.    Marland Kitchen lubiprostone (AMITIZA) 24 MCG capsule Take 24 mcg by mouth 2 (two) times daily with a meal.     . Olopatadine HCl (PATADAY) 0.2 % SOLN Place 1 drop into both eyes 2 (two) times daily.    Marland Kitchen omeprazole (PRILOSEC) 20 MG capsule Take 20 mg by mouth daily.    . potassium chloride SA (K-DUR,KLOR-CON) 20 MEQ tablet Take 1 tablet (20 mEq total) by mouth daily. 30 tablet 0  . sacubitril-valsartan (ENTRESTO) 49-51 MG Take 1 tablet by mouth 2 (  two) times daily. 60 tablet 3  . spironolactone (ALDACTONE) 25 MG tablet Take 1 tablet (25 mg total) by mouth daily. 90 tablet 2  . tiotropium (SPIRIVA) 18 MCG inhalation capsule Place 18 mcg into inhaler and inhale daily.    . traMADol (ULTRAM) 50 MG tablet Take 50 mg by mouth every 6 (six) hours as needed for moderate pain.     No current facility-administered medications for this visit.     ROS: See HPI for pertinent positives and negatives.   Physical Examination  Vitals:   04/14/17 1428 04/14/17 1431  BP: 103/65 107/66  Pulse: 76   Resp: 20   Temp: (!) 97.3 F (36.3 C)   TempSrc: Oral   SpO2: 97%   Weight: 213 lb 1.6 oz (96.7 kg)   Height: 5\' 7"  (1.702 m)    Body mass index is 33.38 kg/m.  General: A&O x 3, WDWN, obese male.  Gait: using cane, slow and deliberate  Eyes: Pupils =.  Pulmonary: Respirations are non labored, CTAB, without wheezes , rales or rhonchi, decreased air movement in posterior fields, occasional moist cough  Cardiac: regular rhythm and rate, no detected murmur. ICD palpated subcutaneously left side of chest.  Carotid Bruits Left Right   Negative  Negative    Abdominal aortic pulse is not palpable.  Radial pulses: are 2+ palpable and =   VASCULAR  EXAM: Extremitieswithout ischemic changes  without Gangrene; without open wounds. Feet are pink and warm, skin is dry, evidence of healed ulcer at dorsum of right foot, onychomycosis most toenails    LE Pulses  LEFT  RIGHT   FEMORAL  palpable palpable   POPLITEAL  not palpable  not palpable  POSTERIOR TIBIAL  not palpable not palpabler  DORSALIS PEDIS ANTERIOR TIBIAL  not palpable  not palpable  PERONEAL  not Palpable  not Palpable    Abdomen: soft, NT, no masses palpated.  Skin: no rashes, no ulcers, no cellulitis.  Musculoskeletal: no muscle wasting or atrophy.  Neurologic: A&O X 3; Appropriate Affect ; SENSATION: normal; MOTOR FUNCTION: moving all extremities equally, motor strength 5/5 in UE's, 4/5 in LE's. Speech is fluent/normal. CN 2-12 intact   ASSESSMENT: [name] [name] is a 66 y.o. male whopresents with stable bilateral LE moderate arterial occlusive disease in the background of DM, currently well controlled, and long term tobacco use. He has pain at the posterior aspects of both calves, right more so than left, after walking almost a mile, relieved by rest.  The ulcers in his feet have healed. There are no signs of ischemia in his feet/legs.    DATA  ABI (Date: 04/14/2017):  R:   ABI: 0.72 (was 0.67 on 04-03-16),   PT: mono  DP: mono  TBI:  0.20 (was 0.28)  L:   ABI: 0.78 (was 0.78),   PT: mono  DP: mono  TBI: 0.30 (was 0.58)  Stable bilateral ABI with moderate disease, all monophasic waveforms; decline in bilateral TBI.     Bilateral LE arterial Duplex result from Dec., 2014 showed diffuse plaque throughout lower extremities.   PLAN:  Continue extensive walking. The patient was counseled re smoking cessation and given several free resources re smoking cessation.  Based on the patient's vascular studies and examination, pt will return to clinic in 1 year with ABI's. I advised him to return sooner if he develops  concerns re the circulation in his feet/legs.    I discussed in depth with the patient the nature of  atherosclerosis, and emphasized the importance of maximal medical management including strict control of blood pressure, blood glucose, and lipid levels, obtaining regular exercise, and cessation of smoking.  The patient is aware that without maximal medical management the underlying atherosclerotic disease process will progress, limiting the benefit of any interventions.  The patient was given information about PAD including signs, symptoms, treatment, what symptoms should prompt the patient to seek immediate medical care, and risk reduction measures to take.  Clemon Chambers, RN, MSN, FNP-C [facility] Phone: [phone]  Clinic MD: [place]  04/14/17 2:35 PM

## 2017-04-14 NOTE — Patient Instructions (Signed)
Steps to Quit Smoking Smoking tobacco can be bad for your health. It can also affect almost every organ in your body. Smoking puts you and people around you at risk for many serious long-lasting (chronic) diseases. Quitting smoking is hard, but it is one of the best things that you can do for your health. It is never too late to quit. What are the benefits of quitting smoking? When you quit smoking, you lower your risk for getting serious diseases and conditions. They can include:  Lung cancer or lung disease.  Heart disease.  Stroke.  Heart attack.  Not being able to have children (infertility).  Weak bones (osteoporosis) and broken bones (fractures).  If you have coughing, wheezing, and shortness of breath, those symptoms may get better when you quit. You may also get sick less often. If you are pregnant, quitting smoking can help to lower your chances of having a baby of low birth weight. What can I do to help me quit smoking? Talk with your doctor about what can help you quit smoking. Some things you can do (strategies) include:  Quitting smoking totally, instead of slowly cutting back how much you smoke over a period of time.  Going to in-person counseling. You are more likely to quit if you go to many counseling sessions.  Using resources and support systems, such as: ? Online chats with a counselor. ? Phone quitlines. ? Printed self-help materials. ? Support groups or group counseling. ? Text messaging programs. ? Mobile phone apps or applications.  Taking medicines. Some of these medicines may have nicotine in them. If you are pregnant or breastfeeding, do not take any medicines to quit smoking unless your doctor says it is okay. Talk with your doctor about counseling or other things that can help you.  Talk with your doctor about using more than one strategy at the same time, such as taking medicines while you are also going to in-person counseling. This can help make  quitting easier. What things can I do to make it easier to quit? Quitting smoking might feel very hard at first, but there is a lot that you can do to make it easier. Take these steps:  Talk to your family and friends. Ask them to support and encourage you.  Call phone quitlines, reach out to support groups, or work with a counselor.  Ask people who smoke to not smoke around you.  Avoid places that make you want (trigger) to smoke, such as: ? Bars. ? Parties. ? Smoke-break areas at work.  Spend time with people who do not smoke.  Lower the stress in your life. Stress can make you want to smoke. Try these things to help your stress: ? Getting regular exercise. ? Deep-breathing exercises. ? Yoga. ? Meditating. ? Doing a body scan. To do this, close your eyes, focus on one area of your body at a time from head to toe, and notice which parts of your body are tense. Try to relax the muscles in those areas.  Download or buy apps on your mobile phone or tablet that can help you stick to your quit plan. There are many free apps, such as QuitGuide from the CDC (Centers for Disease Control and Prevention). You can find more support from smokefree.gov and other websites.  This information is not intended to replace advice given to you by your health care provider. Make sure you discuss any questions you have with your health care provider. Document Released: 04/04/2009 Document   Revised: 02/04/2016 Document Reviewed: 10/23/2014 Elsevier Interactive Patient Education  2018 Elsevier Inc.     Peripheral Vascular Disease Peripheral vascular disease (PVD) is a disease of the blood vessels that are not part of your heart and brain. A simple term for PVD is poor circulation. In most cases, PVD narrows the blood vessels that carry blood from your heart to the rest of your body. This can result in a decreased supply of blood to your arms, legs, and internal organs, like your stomach or kidneys.  However, it most often affects a person's lower legs and feet. There are two types of PVD.  Organic PVD. This is the more common type. It is caused by damage to the structure of blood vessels.  Functional PVD. This is caused by conditions that make blood vessels contract and tighten (spasm).  Without treatment, PVD tends to get worse over time. PVD can also lead to acute ischemic limb. This is when an arm or limb suddenly has trouble getting enough blood. This is a medical emergency. Follow these instructions at home:  Take medicines only as told by your doctor.  Do not use any tobacco products, including cigarettes, chewing tobacco, or electronic cigarettes. If you need help quitting, ask your doctor.  Lose weight if you are overweight, and maintain a healthy weight as told by your doctor.  Eat a diet that is low in fat and cholesterol. If you need help, ask your doctor.  Exercise regularly. Ask your doctor for some good activities for you.  Take good care of your feet. ? Wear comfortable shoes that fit well. ? Check your feet often for any cuts or sores. Contact a doctor if:  You have cramps in your legs while walking.  You have leg pain when you are at rest.  You have coldness in a leg or foot.  Your skin changes.  You are unable to get or have an erection (erectile dysfunction).  You have cuts or sores on your feet that are not healing. Get help right away if:  Your arm or leg turns cold and blue.  Your arms or legs become red, warm, swollen, painful, or numb.  You have chest pain or trouble breathing.  You suddenly have weakness in your face, arm, or leg.  You become very confused or you cannot speak.  You suddenly have a very bad headache.  You suddenly cannot see. This information is not intended to replace advice given to you by your health care provider. Make sure you discuss any questions you have with your health care provider. Document Released:  09/02/2009 Document Revised: 11/14/2015 Document Reviewed: 11/16/2013 Elsevier Interactive Patient Education  2017 Elsevier Inc.  

## 2017-04-16 DIAGNOSIS — R32 Unspecified urinary incontinence: Secondary | ICD-10-CM | POA: Diagnosis not present

## 2017-04-16 DIAGNOSIS — I509 Heart failure, unspecified: Secondary | ICD-10-CM | POA: Diagnosis not present

## 2017-04-21 DIAGNOSIS — M109 Gout, unspecified: Secondary | ICD-10-CM | POA: Diagnosis not present

## 2017-04-21 DIAGNOSIS — E7800 Pure hypercholesterolemia, unspecified: Secondary | ICD-10-CM | POA: Diagnosis not present

## 2017-04-21 DIAGNOSIS — I509 Heart failure, unspecified: Secondary | ICD-10-CM | POA: Diagnosis not present

## 2017-04-21 DIAGNOSIS — Z79899 Other long term (current) drug therapy: Secondary | ICD-10-CM | POA: Diagnosis not present

## 2017-04-21 DIAGNOSIS — Z1331 Encounter for screening for depression: Secondary | ICD-10-CM | POA: Diagnosis not present

## 2017-04-21 DIAGNOSIS — I739 Peripheral vascular disease, unspecified: Secondary | ICD-10-CM | POA: Diagnosis not present

## 2017-04-21 DIAGNOSIS — I2510 Atherosclerotic heart disease of native coronary artery without angina pectoris: Secondary | ICD-10-CM | POA: Diagnosis not present

## 2017-04-21 DIAGNOSIS — R32 Unspecified urinary incontinence: Secondary | ICD-10-CM | POA: Diagnosis not present

## 2017-04-21 DIAGNOSIS — M25551 Pain in right hip: Secondary | ICD-10-CM | POA: Diagnosis not present

## 2017-04-21 DIAGNOSIS — E119 Type 2 diabetes mellitus without complications: Secondary | ICD-10-CM | POA: Diagnosis not present

## 2017-04-27 DIAGNOSIS — M25551 Pain in right hip: Secondary | ICD-10-CM | POA: Diagnosis not present

## 2017-04-28 NOTE — Addendum Note (Signed)
Addended by: Lianne Cure A on: 04/28/2017 03:14 PM   Modules accepted: Orders

## 2017-05-03 ENCOUNTER — Encounter (HOSPITAL_COMMUNITY): Payer: Self-pay

## 2017-05-04 DIAGNOSIS — R32 Unspecified urinary incontinence: Secondary | ICD-10-CM | POA: Diagnosis not present

## 2017-05-04 DIAGNOSIS — I509 Heart failure, unspecified: Secondary | ICD-10-CM | POA: Diagnosis not present

## 2017-05-06 ENCOUNTER — Other Ambulatory Visit (HOSPITAL_COMMUNITY): Payer: Self-pay | Admitting: *Deleted

## 2017-05-06 MED ORDER — SACUBITRIL-VALSARTAN 49-51 MG PO TABS: 1.0000 | ORAL_TABLET | Freq: Two times a day (BID) | ORAL | 3 refills | Status: DC

## 2017-05-12 ENCOUNTER — Ambulatory Visit: Payer: Medicare HMO | Admitting: Sports Medicine

## 2017-05-12 DIAGNOSIS — M25551 Pain in right hip: Secondary | ICD-10-CM | POA: Diagnosis not present

## 2017-05-18 DIAGNOSIS — M25551 Pain in right hip: Secondary | ICD-10-CM | POA: Diagnosis not present

## 2017-05-18 DIAGNOSIS — M87051 Idiopathic aseptic necrosis of right femur: Secondary | ICD-10-CM | POA: Diagnosis not present

## 2017-05-19 DIAGNOSIS — R32 Unspecified urinary incontinence: Secondary | ICD-10-CM | POA: Diagnosis not present

## 2017-05-19 DIAGNOSIS — I509 Heart failure, unspecified: Secondary | ICD-10-CM | POA: Diagnosis not present

## 2017-05-24 DIAGNOSIS — M87051 Idiopathic aseptic necrosis of right femur: Secondary | ICD-10-CM | POA: Diagnosis not present

## 2017-05-24 DIAGNOSIS — M25551 Pain in right hip: Secondary | ICD-10-CM | POA: Diagnosis not present

## 2017-05-28 DIAGNOSIS — Z720 Tobacco use: Secondary | ICD-10-CM | POA: Diagnosis not present

## 2017-05-28 DIAGNOSIS — M87051 Idiopathic aseptic necrosis of right femur: Secondary | ICD-10-CM | POA: Diagnosis not present

## 2017-05-28 DIAGNOSIS — Z6834 Body mass index (BMI) 34.0-34.9, adult: Secondary | ICD-10-CM | POA: Diagnosis not present

## 2017-05-28 DIAGNOSIS — M1611 Unilateral primary osteoarthritis, right hip: Secondary | ICD-10-CM | POA: Diagnosis not present

## 2017-06-02 DIAGNOSIS — I509 Heart failure, unspecified: Secondary | ICD-10-CM | POA: Diagnosis not present

## 2017-06-02 DIAGNOSIS — R32 Unspecified urinary incontinence: Secondary | ICD-10-CM | POA: Diagnosis not present

## 2017-06-04 ENCOUNTER — Ambulatory Visit (INDEPENDENT_AMBULATORY_CARE_PROVIDER_SITE_OTHER): Payer: Medicare HMO | Admitting: Sports Medicine

## 2017-06-04 ENCOUNTER — Encounter: Payer: Self-pay | Admitting: Sports Medicine

## 2017-06-04 DIAGNOSIS — I739 Peripheral vascular disease, unspecified: Secondary | ICD-10-CM | POA: Diagnosis not present

## 2017-06-04 DIAGNOSIS — F17200 Nicotine dependence, unspecified, uncomplicated: Secondary | ICD-10-CM | POA: Diagnosis not present

## 2017-06-04 DIAGNOSIS — B351 Tinea unguium: Secondary | ICD-10-CM | POA: Diagnosis not present

## 2017-06-04 DIAGNOSIS — E1140 Type 2 diabetes mellitus with diabetic neuropathy, unspecified: Secondary | ICD-10-CM

## 2017-06-04 DIAGNOSIS — L853 Xerosis cutis: Secondary | ICD-10-CM

## 2017-06-04 DIAGNOSIS — M79676 Pain in unspecified toe(s): Secondary | ICD-10-CM

## 2017-06-04 NOTE — Progress Notes (Signed)
Patient ID: Paola Flynt, male   DOB: 1951-05-27, 66 y.o.   MRN: [id number]  Subjective: Eleftherios Dudenhoeffer is a 66 y.o. male patient with history of type 2 diabetes who returns to office today complaining of long, painful nails  while ambulating in shoes, unable to trim. Patient states that the glucose reading wednesday was 104. Patient denies any new changes in medication or new problems.  Last saw PCP last week   Patient Active Problem List   Diagnosis Date Noted  . Chest pain 04/08/2015  . Abnormal nuclear stress test 04/08/2015  . Ischemic cardiomyopathy 07/06/2014  . Lumbago 03/13/2014  . Special screening for malignant neoplasms, colon 02/09/2014  . Normocytic anemia 02/09/2014  . Pleural effusion on left 11/28/2013  . Atherosclerosis of native arteries of extremity with intermittent claudication ([place]) 11/07/2013  . Weakness of both legs 11/07/2013  . Smoker 09/19/2013  . Numbness in right leg/ Foot 06/14/2013  . Pain in limb-Right Leg/foot 06/14/2013  . Atherosclerosis of native arteries of the extremities with ulceration(440.23) 06/14/2013  . Chronic systolic heart failure ([place]) 05/29/2013  . Atrial fibrillation ([place]) 05/29/2013  . Chest pain, mid sternal 05/19/2013  . S/P CABG x 5 05/08/2013  . Right foot ulcer ([place]) 05/04/2013  . Coronary atherosclerosis of native coronary artery 04/28/2013  . Acute systolic heart failure ([place]) 04/28/2013  . Abnormal blood chemistry 12/30/2012  . Abnormal LFTs 12/30/2012  . Aspiration pneumonia ([place]) 12/30/2012  . Chronic congestive heart failure ([place]) 12/27/2012  . Leg weakness 12/26/2012  . Pneumonia 05/18/2011  . Gout 05/17/2011  . Respiratory distress, acute 05/17/2011  . Hypertension 05/17/2011  . COPD (chronic obstructive pulmonary disease) ([place]) 05/17/2011  . CHF, acute ([place]) 05/17/2011   Current Outpatient Medications on File Prior to Visit  Medication Sig Dispense Refill  . albuterol (PROVENTIL HFA;VENTOLIN HFA) 108 (90 BASE) MCG/ACT  inhaler Inhale 1 puff into the lungs every 6 (six) hours as needed for wheezing or shortness of breath.    . allopurinol (ZYLOPRIM) 300 MG tablet Take 300 mg by mouth daily.    Marland Kitchen aspirin EC 81 MG tablet TAKE ONE TABLET BY MOUTH ONCE DAILY 30 tablet 3  . atorvastatin (LIPITOR) 20 MG tablet Take 20 mg by mouth daily.    Marland Kitchen b complex vitamins tablet Take 1 tablet by mouth daily.    . budesonide-formoterol (SYMBICORT) 80-4.5 MCG/ACT inhaler Inhale 2 puffs into the lungs 2 (two) times daily. 1 Inhaler 0  . carvedilol (COREG) 3.125 MG tablet TAKE 2 TABS BY MOUTH IN THE AM & 1 TABS BY MOUTH IN THE PM    . colchicine (COLCRYS) 0.6 MG tablet Take 0.6 mg by mouth daily as needed (gout).    . furosemide (LASIX) 20 MG tablet Take 3 tablets (60 mg total) by mouth daily. 90 tablet 3  . gabapentin (NEURONTIN) 300 MG capsule Take 300 mg by mouth 2 (two) times daily.    Marland Kitchen lubiprostone (AMITIZA) 24 MCG capsule Take 24 mcg by mouth 2 (two) times daily with a meal.     . Olopatadine HCl (PATADAY) 0.2 % SOLN Place 1 drop into both eyes 2 (two) times daily.    Marland Kitchen omeprazole (PRILOSEC) 20 MG capsule Take 20 mg by mouth daily.    . potassium chloride SA (K-DUR,KLOR-CON) 20 MEQ tablet Take 1 tablet (20 mEq total) by mouth daily. 30 tablet 0  . sacubitril-valsartan (ENTRESTO) 49-51 MG Take 1 tablet 2 (two) times daily by mouth. 60 tablet 3  . spironolactone (  ALDACTONE) 25 MG tablet Take 1 tablet (25 mg total) by mouth daily. 90 tablet 2  . tiotropium (SPIRIVA) 18 MCG inhalation capsule Place 18 mcg into inhaler and inhale daily.    . traMADol (ULTRAM) 50 MG tablet Take 50 mg by mouth every 6 (six) hours as needed for moderate pain.     No current facility-administered medications on file prior to visit.    Allergies  Allergen Reactions  . Penicillins Other (See Comments) and Nausea And Vomiting    Per Dr Audria Nine Via phone 12/27/12 0400 Swelling , long time ago. Doesn't remember   Objective: General: Patient  is awake, alert, and oriented x 3 and in no acute distress.  Integument: Skin is warm, dry and supple bilateral. Continued Moderate plantar>dorsal xerosis bilateral, improving in nature. Nails are tender, long, thickened and dystrophic with subungual debris, consistent with onychomycosis, 1-5 bilateral. No signs of infection. No open lesions or preulcerative lesions present bilateral. Remaining integument unremarkable.  Vasculature:  Dorsalis Pedis pulse 1/4 bilateral. Posterior Tibial pulse  0/4 bilateral.  Capillary fill time <3 sec 1-5 bilateral. Scant hair growth to the level of the digits. Temperature gradient within normal limits. No varicosities present bilateral. Mild trace edema present bilateral ankles.   Neurology: The patient has diminished sensation measured with a 5.07/10g Semmes Weinstein Monofilament at all pedal sites bilateral . Vibratory sensation diminished bilateral with tuning fork. No Babinski sign present bilateral.   Musculoskeletal: No gross pedal deformities noted bilateral. Muscular strength 5/5 in all lower extremity muscular groups bilateral without pain or limitation on range of motion. No tenderness with calf compression bilateral.  Assessment and Plan: Problem List Items Addressed This Visit      Other   Pain in limb-Right Leg/foot   Smoker    Other Visit Diagnoses    Dermatophytosis of nail    -  Primary   Type 2 diabetes, controlled, with neuropathy ([place])       Peripheral vascular disease ([place])       Xerosis of skin         -Examined patient. -Discussed and educated patient on diabetic foot care, especially with regards to the vascular, neurological and musculoskeletal systems.  -Stressed the importance of good glycemic control and the detriment of not controlling glucose levels in relation to the foot. -Mechanically debrided all nails 1-5 bilateral using sterile nail nipper and filed with dremel without incident  -Cont. with B-vitamin and Gabapentin  for neuropathy -Recommend continue with OTC skin emollient for dry skin daily -Recommend continue with good supportive shoes -Encouraged smoking cessation; patient continues to decline  -Answered all patient questions -Patient to return in 3 months for at risk foot care -Patient advised to call the office if any problems or questions arise in the meantime.  Landis Martins, DPM

## 2017-06-09 DIAGNOSIS — E119 Type 2 diabetes mellitus without complications: Secondary | ICD-10-CM | POA: Diagnosis not present

## 2017-06-16 DIAGNOSIS — R32 Unspecified urinary incontinence: Secondary | ICD-10-CM | POA: Diagnosis not present

## 2017-06-16 DIAGNOSIS — I509 Heart failure, unspecified: Secondary | ICD-10-CM | POA: Diagnosis not present

## 2017-06-29 DIAGNOSIS — I509 Heart failure, unspecified: Secondary | ICD-10-CM | POA: Diagnosis not present

## 2017-06-29 DIAGNOSIS — R32 Unspecified urinary incontinence: Secondary | ICD-10-CM | POA: Diagnosis not present

## 2017-07-12 DIAGNOSIS — M159 Polyosteoarthritis, unspecified: Secondary | ICD-10-CM | POA: Diagnosis not present

## 2017-07-12 DIAGNOSIS — L602 Onychogryphosis: Secondary | ICD-10-CM | POA: Diagnosis not present

## 2017-07-12 DIAGNOSIS — J449 Chronic obstructive pulmonary disease, unspecified: Secondary | ICD-10-CM | POA: Diagnosis not present

## 2017-07-12 DIAGNOSIS — I469 Cardiac arrest, cause unspecified: Secondary | ICD-10-CM | POA: Diagnosis not present

## 2017-07-14 DIAGNOSIS — I509 Heart failure, unspecified: Secondary | ICD-10-CM | POA: Diagnosis not present

## 2017-07-14 DIAGNOSIS — R32 Unspecified urinary incontinence: Secondary | ICD-10-CM | POA: Diagnosis not present

## 2017-07-23 DIAGNOSIS — M87051 Idiopathic aseptic necrosis of right femur: Secondary | ICD-10-CM | POA: Diagnosis not present

## 2017-07-26 DIAGNOSIS — R32 Unspecified urinary incontinence: Secondary | ICD-10-CM | POA: Diagnosis not present

## 2017-07-26 DIAGNOSIS — I509 Heart failure, unspecified: Secondary | ICD-10-CM | POA: Diagnosis not present

## 2017-08-11 DIAGNOSIS — R32 Unspecified urinary incontinence: Secondary | ICD-10-CM | POA: Diagnosis not present

## 2017-08-11 DIAGNOSIS — I509 Heart failure, unspecified: Secondary | ICD-10-CM | POA: Diagnosis not present

## 2017-08-12 DIAGNOSIS — Z6833 Body mass index (BMI) 33.0-33.9, adult: Secondary | ICD-10-CM | POA: Diagnosis not present

## 2017-08-12 DIAGNOSIS — Z139 Encounter for screening, unspecified: Secondary | ICD-10-CM | POA: Diagnosis not present

## 2017-08-12 DIAGNOSIS — E785 Hyperlipidemia, unspecified: Secondary | ICD-10-CM | POA: Diagnosis not present

## 2017-08-12 DIAGNOSIS — Z125 Encounter for screening for malignant neoplasm of prostate: Secondary | ICD-10-CM | POA: Diagnosis not present

## 2017-08-12 DIAGNOSIS — E669 Obesity, unspecified: Secondary | ICD-10-CM | POA: Diagnosis not present

## 2017-08-12 DIAGNOSIS — Z0000 Encounter for general adult medical examination without abnormal findings: Secondary | ICD-10-CM | POA: Diagnosis not present

## 2017-08-12 DIAGNOSIS — Z136 Encounter for screening for cardiovascular disorders: Secondary | ICD-10-CM | POA: Diagnosis not present

## 2017-08-18 DIAGNOSIS — I509 Heart failure, unspecified: Secondary | ICD-10-CM | POA: Diagnosis not present

## 2017-08-18 DIAGNOSIS — R32 Unspecified urinary incontinence: Secondary | ICD-10-CM | POA: Diagnosis not present

## 2017-08-20 ENCOUNTER — Other Ambulatory Visit (HOSPITAL_COMMUNITY): Payer: Self-pay | Admitting: *Deleted

## 2017-08-20 MED ORDER — SPIRONOLACTONE 25 MG PO TABS: 25.0000 mg | ORAL_TABLET | Freq: Every day | ORAL | 3 refills | Status: DC

## 2017-08-23 ENCOUNTER — Telehealth (HOSPITAL_COMMUNITY): Payer: Self-pay | Admitting: Pharmacist

## 2017-08-23 NOTE — Telephone Encounter (Signed)
Entresto 49-51 mg BID PA approved by [facility] through 09/03/18. Auth #: [id number]  Ruta Hinds. Velva Harman, PharmD, BCPS, CPP Clinical Pharmacist Phone: [phone] 08/23/2017 11:01 AM

## 2017-08-26 DIAGNOSIS — M8700 Idiopathic aseptic necrosis of unspecified bone: Secondary | ICD-10-CM | POA: Diagnosis not present

## 2017-08-26 DIAGNOSIS — M25551 Pain in right hip: Secondary | ICD-10-CM | POA: Diagnosis not present

## 2017-08-27 DIAGNOSIS — I10 Essential (primary) hypertension: Secondary | ICD-10-CM | POA: Diagnosis not present

## 2017-08-27 DIAGNOSIS — M87051 Idiopathic aseptic necrosis of right femur: Secondary | ICD-10-CM | POA: Diagnosis not present

## 2017-08-27 DIAGNOSIS — E119 Type 2 diabetes mellitus without complications: Secondary | ICD-10-CM | POA: Diagnosis not present

## 2017-08-27 DIAGNOSIS — J449 Chronic obstructive pulmonary disease, unspecified: Secondary | ICD-10-CM | POA: Diagnosis not present

## 2017-08-27 DIAGNOSIS — E7800 Pure hypercholesterolemia, unspecified: Secondary | ICD-10-CM | POA: Diagnosis not present

## 2017-08-27 DIAGNOSIS — I509 Heart failure, unspecified: Secondary | ICD-10-CM | POA: Diagnosis not present

## 2017-08-27 DIAGNOSIS — M109 Gout, unspecified: Secondary | ICD-10-CM | POA: Diagnosis not present

## 2017-08-27 DIAGNOSIS — Z125 Encounter for screening for malignant neoplasm of prostate: Secondary | ICD-10-CM | POA: Diagnosis not present

## 2017-08-27 DIAGNOSIS — I2510 Atherosclerotic heart disease of native coronary artery without angina pectoris: Secondary | ICD-10-CM | POA: Diagnosis not present

## 2017-08-31 ENCOUNTER — Other Ambulatory Visit (HOSPITAL_COMMUNITY): Payer: Self-pay | Admitting: *Deleted

## 2017-08-31 ENCOUNTER — Ambulatory Visit (INDEPENDENT_AMBULATORY_CARE_PROVIDER_SITE_OTHER): Payer: Medicare HMO | Admitting: Internal Medicine

## 2017-08-31 ENCOUNTER — Encounter (INDEPENDENT_AMBULATORY_CARE_PROVIDER_SITE_OTHER): Payer: Self-pay

## 2017-08-31 ENCOUNTER — Encounter: Payer: Self-pay | Admitting: Internal Medicine

## 2017-08-31 VITALS — BP 106/64 | HR 81 | Ht 67.0 in | Wt 212.0 lb

## 2017-08-31 DIAGNOSIS — I509 Heart failure, unspecified: Secondary | ICD-10-CM | POA: Diagnosis not present

## 2017-08-31 DIAGNOSIS — Z95810 Presence of automatic (implantable) cardiac defibrillator: Secondary | ICD-10-CM

## 2017-08-31 DIAGNOSIS — I5022 Chronic systolic (congestive) heart failure: Secondary | ICD-10-CM | POA: Diagnosis not present

## 2017-08-31 DIAGNOSIS — I255 Ischemic cardiomyopathy: Secondary | ICD-10-CM

## 2017-08-31 DIAGNOSIS — R32 Unspecified urinary incontinence: Secondary | ICD-10-CM | POA: Diagnosis not present

## 2017-08-31 LAB — CUP PACEART INCLINIC DEVICE CHECK
Date Time Interrogation Session: 20190312162943
Implantable Lead Implant Date: 20160115
Implantable Lead Location: 753860
Implantable Pulse Generator Implant Date: 20160115
Pulse Gen Serial Number: 7233603

## 2017-08-31 MED ORDER — SPIRONOLACTONE 25 MG PO TABS: 25.0000 mg | ORAL_TABLET | Freq: Every day | ORAL | 3 refills | Status: DC

## 2017-08-31 NOTE — Progress Notes (Signed)
Electrophysiology Office Note   Date:  08/31/2017   ID:  [name] [name], DOB 1951-05-16, MRN [id number]  PCP:  [name], PA-C  Cardiologist:   Primary Electrophysiologist:    [name] Axe, MD    No chief complaint on file.    History of Present Illness: [name] [name] is a 67 y.o. 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 Moderate RV dysfunction. Mild MR. Echo  7/17  EF  30-35%   .   The patient denies chest pain nocturnal dyspnea, orthopnea or peripheral edema.  There have been no palpitations, lightheadedness or syncope.   He has modest DOE   He is fishing as much as he can    Date Cr K  3 /18 1.49 5.1  8 /18 2.59(GFR 28) 4.4     Past Medical History:  Diagnosis Date  . CAD (coronary artery disease)    a. 04/2013 Cath: LM 10, LAD 40p, 19m, D1 50p, LCX 60p, 58m/100m, OM1 50, OM2 100 L-L collats, RCA 100p;  b. CABG x 5: LIMA->LAD, VG->D2, VG->RI->OM2, VG->PDA.  Marland Kitchen Chronic systolic CHF (congestive heart failure) ([place])    a. 04/2013 Echo: EF 15-20%, diff HK, sev HK of inf and ant myocardium, dilated LA,  b. EF 20-25% with restrictive filling pattern, mod RV dysfx, mild MR (10/10/2013);  c.  Echo 12/15: EF 25-30%  . COPD (chronic obstructive pulmonary disease) ([place])   . Diabetes mellitus without complication ([place])   . Emphysema   . Gout   . Hyperlipidemia   . Hypertension   . Ischemic cardiomyopathy   . PVD (peripheral vascular disease) ([place])    a. (10/2013) ABIs: RIGHT 0.63, Waveforms: monophasic;  LEFT 0.78, Waveforms: monophasic  . Tobacco abuse    30+ pack-year history  . Transaminitis    a. 04/2013 Abd U/S and CT unremarkable, hepatitis panel neg -->felt to be 2/2 acute R heart  failure.   Past Surgical History:  Procedure Laterality Date  . APPENDECTOMY  as a child  . CARDIAC CATHETERIZATION  04/08/2015   Procedure: Left Heart Cath and Cors/Grafts Angiography;  Surgeon: Peter M Martinique, MD;  Location: [facility];  Service: Cardiovascular;;  . CORONARY ARTERY BYPASS GRAFT N/A 05/05/2013   Procedure: CORONARY ARTERY BYPASS GRAFTING (CABG) TIMES FIVE USING LEFT INTERNAL MAMMARY ARTERY AND RIGHT AND LEFT SAPHENOUS LEG VEIN HARVESTED ENDOSCOPICALLY;  Surgeon: Melrose Nakayama, MD;  Location: [facility];  Service: Open Heart Surgery;  Laterality: N/A;  . IMPLANTABLE CARDIOVERTER DEFIBRILLATOR IMPLANT N/A 07/06/2014   Procedure: IMPLANTABLE CARDIOVERTER DEFIBRILLATOR IMPLANT;  Surgeon: Deboraha Sprang, MD;  Location[facility] C[facility] LAB;  Service: Cardiovascular;  Laterality: N/A;  . LEFT AND RIGHT HEART CATHETERIZATION WITH CORONARY ANGIOGRAM N/A 04/28/2013   Procedure: LEFT AND RIGHT HEART CATHETERIZATION WITH CORONARY ANGIOGRAM;  Surgeon: Burnell Blanks, MD;  Location: [facility] CATH LAB;  Service: Cardiovascular;  Laterality: N/A;  . MULTIPLE EXTRACTIONS WITH ALVEOLOPLASTY N/A 05/03/2013   Procedure: Extraction of tooth #s 3,4,5,6,8,9,27 with alveoloplast and maxillary left osseous tuberosity reduction;  Surgeon: Lenn Cal, DDS;  Location: [facility];  Service: Oral Surgery;  Laterality: N/A;     Current Outpatient Medications  Medication Sig Dispense Refill  . albuterol (PROVENTIL HFA;VENTOLIN HFA) 108 (90 BASE) MCG/ACT inhaler Inhale 1 puff into the lungs every 6 (six) hours as needed for wheezing or shortness of breath.    . allopurinol (ZYLOPRIM) 300 MG tablet Take 300 mg by mouth daily.    Marland Kitchen aspirin EC 81 MG tablet TAKE ONE TABLET BY MOUTH ONCE DAILY 30 tablet 3  . atorvastatin (LIPITOR) 20 MG tablet Take 20 mg by mouth daily.    Marland Kitchen b complex vitamins tablet Take 1 tablet by mouth daily.    . budesonide-formoterol (SYMBICORT) 80-4.5 MCG/ACT inhaler Inhale 2 puffs  into the lungs 2 (two) times daily. 1 Inhaler 0  . carvedilol (COREG) 3.125 MG tablet TAKE 2 TABS BY MOUTH IN THE AM & 1 TABS BY MOUTH IN THE PM    . colchicine (COLCRYS) 0.6 MG tablet Take 0.6 mg by mouth daily as needed (gout).    . furosemide (LASIX) 20 MG tablet Take 3 tablets (60 mg total) by mouth daily. 90 tablet 3  . gabapentin (NEURONTIN) 300 MG capsule Take 300 mg by mouth 2 (two) times daily.    Marland Kitchen lubiprostone (AMITIZA) 24 MCG capsule Take 24 mcg by mouth 2 (two) times daily with a meal.     . Olopatadine HCl (PATADAY) 0.2 % SOLN Place 1 drop into both eyes 2 (two) times daily.    Marland Kitchen omeprazole (PRILOSEC) 20 MG capsule Take 20 mg by mouth daily.    . potassium chloride SA (K-DUR,KLOR-CON) 20 MEQ tablet Take 1 tablet (20 mEq total) by mouth daily. 30 tablet 0  . sacubitril-valsartan (ENTRESTO) 49-51 MG Take 1 tablet 2 (two) times daily by mouth. 60 tablet 3  . spironolactone (ALDACTONE) 25 MG tablet Take 1 tablet (25 mg total) by mouth daily. 90 tablet 3  . tiotropium (SPIRIVA) 18 MCG inhalation capsule Place 18 mcg into inhaler and inhale daily.    . traMADol (ULTRAM) 50 MG tablet Take 50 mg by mouth every 6 (six) hours as needed for moderate pain.     No current facility-administered medications for this visit.     Allergies:   Penicillins   Social History:  The patient  reports that he has been smoking cigarettes.  He has a 30.00 pack-year smoking history. he has never used smokeless tobacco. He reports that he does not drink alcohol or use drugs.   Family History:  The patient's    family history includes Diabetes in his mother; Heart attack in his unknown relative; Hypertension in his unknown relative.    ROS:  Please see the history of present illness and past medical history  Otherwise, all other systems were reviewed and were negative except for sweating.     PHYSICAL EXAM: VS:  BP 106/64   Pulse 81   Ht 5\' 7"  (1.702 m)   Wt 212 lb (96.2 kg)   SpO2 96%   BMI 33.20  kg/m  , BMI Body mass index is 33.2 kg/m. Well developed and nourished in no acute distress HENT normal Neck supple with JVP-flat Clear Regular rate and rhythm, no murmurs or gallops Abd-soft with active BS No Clubbing cyanosis edema Skin-warm and dry A & Oriented  Grossly normal sensory and motor function     Device interrogation is reviewed today in detail.  See PaceArt for details.   Recent Labs: 10/29/2016: ALT 17 01/29/2017: BUN 44; Creatinine, Ser 2.59; Potassium 4.4; Sodium 139    Lipid Panel  Component Value Date/Time   CHOL 98 05/01/2013 0356   TRIG 71 05/01/2013 0356   HDL 31 (L) 05/01/2013 0356   CHOLHDL 3.2 05/01/2013 0356   VLDL 14 05/01/2013 0356   LDLCALC 53 05/01/2013 0356     Wt Readings from Last 3 Encounters:  08/31/17 212 lb (96.2 kg)  04/14/17 213 lb 1.6 oz (96.7 kg)  01/29/17 217 lb 12.8 oz (98.8 kg)      Other studies Reviewed: Additional studies/ records that were reviewed today include: labs   ASSESSMENT AND PLAN:  Ischemic cardiomyopathy  Congestive heart failure-chronic-systolic  Implantable defibrillator-[employer] Jude  Right bundle/left anterior fascicular block  Renal insufficiency grade 4  Hypertension  Hyperlipidemia  VT nonsustaineD    Without symptoms of ischemia  Euvolemic continue current meds  No intercurrent Ventricular tachycardia   Need to check labs,  High risk medications with renal insufficiency-- if GFR is unimproved will need to revise meds    Current medicines are reviewed at length with the patient today.   The patient does not have concerns regarding his medicines.  The following changes were made today:  none  Labs/ tests ordered today include: As above   Orders Placed This Encounter  Procedures  . Basic Metabolic Panel (BMET)  . CUP PACEART [place]  . EKG 12-Lead   Will see next year       Signed, [name] Axe, MD  08/31/2017 6:18 PM     [address] [place] [address] [phone] (office) [phone] (fax)

## 2017-08-31 NOTE — Patient Instructions (Addendum)
Medication Instructions:  Your physician recommends that you continue on your current medications as directed. Please refer to the Current Medication list given to you today.  Labwork:  BMP  Testing/Procedures: None ordered.  Follow-Up: Your physician recommends that you schedule a follow-up appointment in:   One year with Dr Caryl Comes 3 months with the heart failure clinic  Remote monitoring is used to monitor your ICD from home. This monitoring reduces the number of office visits required to check your device to one time per year. It allows [place] to keep an eye on the functioning of your device to ensure it is working properly. You are scheduled for a device check from home on 11/30/2017. You may send your transmission at any time that day. If you have a wireless device, the transmission will be sent automatically. After your physician reviews your transmission, you will receive a postcard with your next transmission date.    Any Other Special Instructions Will Be Listed Below (If Applicable).     If you need a refill on your cardiac medications before your next appointment, please call your pharmacy.

## 2017-09-01 LAB — BASIC METABOLIC PANEL
BUN / CREAT RATIO: 19 (ref 10–24)
BUN: 33 mg/dL — AB (ref 8–27)
CHLORIDE: 107 mmol/L — AB (ref 96–106)
CO2: 18 mmol/L — ABNORMAL LOW (ref 20–29)
CREATININE: 1.78 mg/dL — AB (ref 0.76–1.27)
Calcium: 9.5 mg/dL (ref 8.6–10.2)
GFR calc Af Amer: 45 mL/min/{1.73_m2} — ABNORMAL LOW (ref >59)
GFR calc non Af Amer: 39 mL/min/{1.73_m2} — ABNORMAL LOW (ref >59)
Glucose: 76 mg/dL (ref 65–99)
Potassium: 4.4 mmol/L (ref 3.5–5.2)
Sodium: 141 mmol/L (ref 134–144)

## 2017-09-02 ENCOUNTER — Ambulatory Visit (INDEPENDENT_AMBULATORY_CARE_PROVIDER_SITE_OTHER): Payer: Medicare HMO | Admitting: Sports Medicine

## 2017-09-02 ENCOUNTER — Encounter: Payer: Self-pay | Admitting: Sports Medicine

## 2017-09-02 DIAGNOSIS — M79676 Pain in unspecified toe(s): Secondary | ICD-10-CM | POA: Diagnosis not present

## 2017-09-02 DIAGNOSIS — R32 Unspecified urinary incontinence: Secondary | ICD-10-CM | POA: Diagnosis not present

## 2017-09-02 DIAGNOSIS — I509 Heart failure, unspecified: Secondary | ICD-10-CM | POA: Diagnosis not present

## 2017-09-02 DIAGNOSIS — E1140 Type 2 diabetes mellitus with diabetic neuropathy, unspecified: Secondary | ICD-10-CM

## 2017-09-02 DIAGNOSIS — I739 Peripheral vascular disease, unspecified: Secondary | ICD-10-CM

## 2017-09-02 DIAGNOSIS — L853 Xerosis cutis: Secondary | ICD-10-CM

## 2017-09-02 DIAGNOSIS — F17200 Nicotine dependence, unspecified, uncomplicated: Secondary | ICD-10-CM

## 2017-09-02 DIAGNOSIS — B351 Tinea unguium: Secondary | ICD-10-CM | POA: Diagnosis not present

## 2017-09-02 NOTE — Progress Notes (Signed)
Patient ID: [name] [name], male   DOB: 1951-06-01, 67 y.o.   MRN: [id number]  Subjective: [name] [name] is a 67 y.o. male patient with history of type 2 diabetes who returns to office today complaining of long, painful nails  while ambulating in shoes, unable to trim. Patient states that the glucose reading today was not recorded. Patient denies any new changes in medication or new problems.  Last saw PCP on August 27, 2017  Patient Active Problem List   Diagnosis Date Noted  . Chest pain 04/08/2015  . Abnormal nuclear stress test 04/08/2015  . Ischemic cardiomyopathy 07/06/2014  . Lumbago 03/13/2014  . Special screening for malignant neoplasms, colon 02/09/2014  . Normocytic anemia 02/09/2014  . Pleural effusion on left 11/28/2013  . Atherosclerosis of native arteries of extremity with intermittent claudication ([place]) 11/07/2013  . Weakness of both legs 11/07/2013  . Smoker 09/19/2013  . Numbness in right leg/ Foot 06/14/2013  . Pain in limb-Right Leg/foot 06/14/2013  . Atherosclerosis of native arteries of the extremities with ulceration(440.23) 06/14/2013  . Chronic systolic heart failure ([name]) 05/29/2013  . Atrial fibrillation ([name]) 05/29/2013  . Chest pain, mid sternal 05/19/2013  . S/P CABG x 5 05/08/2013  . Right foot ulcer ([place]) 05/04/2013  . Coronary atherosclerosis of native coronary artery 04/28/2013  . Acute systolic heart failure ([name]) 04/28/2013  . Abnormal blood chemistry 12/30/2012  . Abnormal LFTs 12/30/2012  . Aspiration pneumonia ([name]) 12/30/2012  . Chronic congestive heart failure ([name]) 12/27/2012  . Leg weakness 12/26/2012  . Pneumonia 05/18/2011  . Gout 05/17/2011  . Respiratory distress, acute 05/17/2011  . Hypertension 05/17/2011  . COPD (chronic obstructive pulmonary disease) ([name]) 05/17/2011  . CHF, acute ([name]) 05/17/2011   Current Outpatient Medications on File Prior to Visit  Medication Sig Dispense Refill  . albuterol (PROVENTIL HFA;VENTOLIN HFA) 108 (90  BASE) MCG/ACT inhaler Inhale 1 puff into the lungs every 6 (six) hours as needed for wheezing or shortness of breath.    . allopurinol (ZYLOPRIM) 300 MG tablet Take 300 mg by mouth daily.    Marland Kitchen aspirin EC 81 MG tablet TAKE ONE TABLET BY MOUTH ONCE DAILY 30 tablet 3  . atorvastatin (LIPITOR) 20 MG tablet Take 20 mg by mouth daily.    Marland Kitchen b complex vitamins tablet Take 1 tablet by mouth daily.    . budesonide-formoterol (SYMBICORT) 80-4.5 MCG/ACT inhaler Inhale 2 puffs into the lungs 2 (two) times daily. 1 Inhaler 0  . carvedilol (COREG) 3.125 MG tablet TAKE 2 TABS BY MOUTH IN THE AM & 1 TABS BY MOUTH IN THE PM    . colchicine (COLCRYS) 0.6 MG tablet Take 0.6 mg by mouth daily as needed (gout).    . furosemide (LASIX) 20 MG tablet Take 3 tablets (60 mg total) by mouth daily. 90 tablet 3  . gabapentin (NEURONTIN) 300 MG capsule Take 300 mg by mouth 2 (two) times daily.    Marland Kitchen lubiprostone (AMITIZA) 24 MCG capsule Take 24 mcg by mouth 2 (two) times daily with a meal.     . Olopatadine HCl (PATADAY) 0.2 % SOLN Place 1 drop into both eyes 2 (two) times daily.    Marland Kitchen omeprazole (PRILOSEC) 20 MG capsule Take 20 mg by mouth daily.    . potassium chloride SA (K-DUR,KLOR-CON) 20 MEQ tablet Take 1 tablet (20 mEq total) by mouth daily. 30 tablet 0  . sacubitril-valsartan (ENTRESTO) 49-51 MG Take 1 tablet 2 (two) times daily by mouth. 60 tablet 3  .  spironolactone (ALDACTONE) 25 MG tablet Take 1 tablet (25 mg total) by mouth daily. 90 tablet 3  . tiotropium (SPIRIVA) 18 MCG inhalation capsule Place 18 mcg into inhaler and inhale daily.    . traMADol (ULTRAM) 50 MG tablet Take 50 mg by mouth every 6 (six) hours as needed for moderate pain.     No current facility-administered medications on file prior to visit.    Allergies  Allergen Reactions  . Penicillins Other (See Comments) and Nausea And Vomiting    Per Dr Audria Nine Via phone 12/27/12 0400 Swelling , long time ago. Doesn't remember    Objective: General: Patient is awake, alert, and oriented x 3 and in no acute distress.  Integument: Skin is warm, dry and supple bilateral. Continued Moderate plantar>dorsal xerosis bilateral, improving in nature. Nails are tender, long, thickened and dystrophic with subungual debris, consistent with onychomycosis, 1-5 bilateral. No signs of infection. No open lesions or preulcerative lesions present bilateral. Remaining integument unremarkable.  Vasculature:  Dorsalis Pedis pulse 1/4 bilateral. Posterior Tibial pulse  0/4 bilateral.  Capillary fill time <3 sec 1-5 bilateral. Scant hair growth to the level of the digits. Temperature gradient within normal limits. No varicosities present bilateral. Mild trace edema present bilateral ankles.   Neurology: The patient has diminished sensation measured with a 5.07/10g Semmes Weinstein Monofilament at all pedal sites bilateral . Vibratory sensation diminished bilateral with tuning fork. No Babinski sign present bilateral.   Musculoskeletal: No gross pedal deformities noted bilateral. Muscular strength 5/5 in all lower extremity muscular groups bilateral without pain or limitation on range of motion. No tenderness with calf compression bilateral.  Assessment and Plan: Problem List Items Addressed This Visit      Other   Pain in limb-Right Leg/foot   Smoker    Other Visit Diagnoses    Dermatophytosis of nail    -  Primary   Type 2 diabetes, controlled, with neuropathy ([place])       Peripheral vascular disease ([place])       Xerosis of skin         -Examined patient. -Discussed and educated patient on diabetic foot care, especially with regards to the vascular, neurological and musculoskeletal systems.  -Stressed the importance of good glycemic control and the detriment of not controlling glucose levels in relation to the foot. -Mechanically debrided all nails 1-5 bilateral using sterile nail nipper and filed with dremel without incident   -Recommend again to patient to continue with OTC skin emollient for dry skin daily -Recommend continue with good supportive shoes -Patient to return in 3 months for at risk foot care -Patient advised to call the office if any problems or questions arise in the meantime.  Landis Martins, DPM

## 2017-09-10 DIAGNOSIS — M87051 Idiopathic aseptic necrosis of right femur: Secondary | ICD-10-CM | POA: Diagnosis not present

## 2017-09-15 DIAGNOSIS — R32 Unspecified urinary incontinence: Secondary | ICD-10-CM | POA: Diagnosis not present

## 2017-09-15 DIAGNOSIS — I509 Heart failure, unspecified: Secondary | ICD-10-CM | POA: Diagnosis not present

## 2017-09-17 DIAGNOSIS — M87051 Idiopathic aseptic necrosis of right femur: Secondary | ICD-10-CM | POA: Diagnosis not present

## 2017-09-17 DIAGNOSIS — M1611 Unilateral primary osteoarthritis, right hip: Secondary | ICD-10-CM | POA: Diagnosis not present

## 2017-09-28 DIAGNOSIS — N289 Disorder of kidney and ureter, unspecified: Secondary | ICD-10-CM | POA: Diagnosis not present

## 2017-10-05 DIAGNOSIS — R32 Unspecified urinary incontinence: Secondary | ICD-10-CM | POA: Diagnosis not present

## 2017-10-05 DIAGNOSIS — I509 Heart failure, unspecified: Secondary | ICD-10-CM | POA: Diagnosis not present

## 2017-10-11 ENCOUNTER — Other Ambulatory Visit (HOSPITAL_COMMUNITY): Payer: Self-pay | Admitting: *Deleted

## 2017-10-11 MED ORDER — SACUBITRIL-VALSARTAN 49-51 MG PO TABS: 1.0000 | ORAL_TABLET | Freq: Two times a day (BID) | ORAL | 3 refills | Status: DC

## 2017-10-19 DIAGNOSIS — R32 Unspecified urinary incontinence: Secondary | ICD-10-CM | POA: Diagnosis not present

## 2017-10-19 DIAGNOSIS — I509 Heart failure, unspecified: Secondary | ICD-10-CM | POA: Diagnosis not present

## 2017-10-21 ENCOUNTER — Other Ambulatory Visit (HOSPITAL_COMMUNITY): Payer: Self-pay | Admitting: *Deleted

## 2017-10-21 MED ORDER — FUROSEMIDE 20 MG PO TABS: 60.0000 mg | ORAL_TABLET | Freq: Every day | ORAL | 3 refills | Status: DC

## 2017-10-26 DIAGNOSIS — I509 Heart failure, unspecified: Secondary | ICD-10-CM | POA: Diagnosis not present

## 2017-10-26 DIAGNOSIS — R32 Unspecified urinary incontinence: Secondary | ICD-10-CM | POA: Diagnosis not present

## 2017-11-10 DIAGNOSIS — E119 Type 2 diabetes mellitus without complications: Secondary | ICD-10-CM | POA: Diagnosis not present

## 2017-11-10 DIAGNOSIS — H25813 Combined forms of age-related cataract, bilateral: Secondary | ICD-10-CM | POA: Diagnosis not present

## 2017-11-16 DIAGNOSIS — R32 Unspecified urinary incontinence: Secondary | ICD-10-CM | POA: Diagnosis not present

## 2017-11-16 DIAGNOSIS — I509 Heart failure, unspecified: Secondary | ICD-10-CM | POA: Diagnosis not present

## 2017-11-29 DIAGNOSIS — I509 Heart failure, unspecified: Secondary | ICD-10-CM | POA: Diagnosis not present

## 2017-11-29 DIAGNOSIS — M87051 Idiopathic aseptic necrosis of right femur: Secondary | ICD-10-CM | POA: Diagnosis not present

## 2017-11-29 DIAGNOSIS — I2510 Atherosclerotic heart disease of native coronary artery without angina pectoris: Secondary | ICD-10-CM | POA: Diagnosis not present

## 2017-11-29 DIAGNOSIS — E7800 Pure hypercholesterolemia, unspecified: Secondary | ICD-10-CM | POA: Diagnosis not present

## 2017-11-29 DIAGNOSIS — Z79899 Other long term (current) drug therapy: Secondary | ICD-10-CM | POA: Diagnosis not present

## 2017-11-29 DIAGNOSIS — E119 Type 2 diabetes mellitus without complications: Secondary | ICD-10-CM | POA: Diagnosis not present

## 2017-11-29 DIAGNOSIS — I10 Essential (primary) hypertension: Secondary | ICD-10-CM | POA: Diagnosis not present

## 2017-11-30 ENCOUNTER — Telehealth: Payer: Self-pay

## 2017-11-30 ENCOUNTER — Encounter: Payer: Medicare HMO | Admitting: *Deleted

## 2017-11-30 NOTE — Telephone Encounter (Signed)
Spoke with pt and reminded pt of remote transmission that is due today. Pt verbalized understanding.   

## 2017-12-01 ENCOUNTER — Encounter (HOSPITAL_COMMUNITY): Payer: Self-pay

## 2017-12-01 ENCOUNTER — Encounter: Payer: Self-pay | Admitting: Cardiology

## 2017-12-01 DIAGNOSIS — I509 Heart failure, unspecified: Secondary | ICD-10-CM | POA: Diagnosis not present

## 2017-12-01 DIAGNOSIS — R32 Unspecified urinary incontinence: Secondary | ICD-10-CM | POA: Diagnosis not present

## 2017-12-03 ENCOUNTER — Encounter: Payer: Self-pay | Admitting: Sports Medicine

## 2017-12-03 ENCOUNTER — Ambulatory Visit (INDEPENDENT_AMBULATORY_CARE_PROVIDER_SITE_OTHER): Payer: Medicare HMO | Admitting: Sports Medicine

## 2017-12-03 VITALS — BP 119/78 | HR 75 | Temp 96.6°F | Resp 16

## 2017-12-03 DIAGNOSIS — M79676 Pain in unspecified toe(s): Secondary | ICD-10-CM

## 2017-12-03 DIAGNOSIS — M2141 Flat foot [pes planus] (acquired), right foot: Secondary | ICD-10-CM

## 2017-12-03 DIAGNOSIS — L853 Xerosis cutis: Secondary | ICD-10-CM

## 2017-12-03 DIAGNOSIS — M2142 Flat foot [pes planus] (acquired), left foot: Secondary | ICD-10-CM

## 2017-12-03 DIAGNOSIS — B351 Tinea unguium: Secondary | ICD-10-CM

## 2017-12-03 DIAGNOSIS — E1140 Type 2 diabetes mellitus with diabetic neuropathy, unspecified: Secondary | ICD-10-CM | POA: Diagnosis not present

## 2017-12-03 DIAGNOSIS — M21619 Bunion of unspecified foot: Secondary | ICD-10-CM

## 2017-12-03 DIAGNOSIS — I739 Peripheral vascular disease, unspecified: Secondary | ICD-10-CM

## 2017-12-03 NOTE — Progress Notes (Signed)
Patient ID: [name] [name], male   DOB: 1951-06-21, 67 y.o.   MRN: [id number]  Subjective: [name] [name] is a 67 y.o. male patient with history of type 2 diabetes who returns to office today complaining of long, painful nails  while ambulating in shoes, unable to trim. Patient states that the glucose reading today was not recorded. Patient denies any new changes in medication or new problems. Patient is interested in Diabetic shoes.  Last saw PCP 4 days ago  Patient Active Problem List   Diagnosis Date Noted  . Chest pain 04/08/2015  . Abnormal nuclear stress test 04/08/2015  . Ischemic cardiomyopathy 07/06/2014  . Lumbago 03/13/2014  . Special screening for malignant neoplasms, colon 02/09/2014  . Normocytic anemia 02/09/2014  . Pleural effusion on left 11/28/2013  . Atherosclerosis of native arteries of extremity with intermittent claudication ([place]) 11/07/2013  . Weakness of both legs 11/07/2013  . Smoker 09/19/2013  . Numbness in right leg/ Foot 06/14/2013  . Pain in limb-Right Leg/foot 06/14/2013  . Atherosclerosis of native arteries of the extremities with ulceration(440.23) 06/14/2013  . Chronic systolic heart failure ([place]) 05/29/2013  . Atrial fibrillation ([place]) 05/29/2013  . Chest pain, mid sternal 05/19/2013  . S/P CABG x 5 05/08/2013  . Right foot ulcer ([place]) 05/04/2013  . Coronary atherosclerosis of native coronary artery 04/28/2013  . Acute systolic heart failure ([place]) 04/28/2013  . Abnormal blood chemistry 12/30/2012  . Abnormal LFTs 12/30/2012  . Aspiration pneumonia ([place]) 12/30/2012  . Chronic congestive heart failure ([place]) 12/27/2012  . Leg weakness 12/26/2012  . Pneumonia 05/18/2011  . Gout 05/17/2011  . Respiratory distress, acute 05/17/2011  . Hypertension 05/17/2011  . COPD (chronic obstructive pulmonary disease) ([place]) 05/17/2011  . CHF, acute ([place]) 05/17/2011   Current Outpatient Medications on File Prior to Visit  Medication Sig Dispense Refill  . albuterol  (PROVENTIL HFA;VENTOLIN HFA) 108 (90 BASE) MCG/ACT inhaler Inhale 1 puff into the lungs every 6 (six) hours as needed for wheezing or shortness of breath.    . allopurinol (ZYLOPRIM) 300 MG tablet Take 300 mg by mouth daily.    Marland Kitchen aspirin EC 81 MG tablet TAKE ONE TABLET BY MOUTH ONCE DAILY 30 tablet 3  . atorvastatin (LIPITOR) 20 MG tablet Take 20 mg by mouth daily.    Marland Kitchen b complex vitamins tablet Take 1 tablet by mouth daily.    . budesonide-formoterol (SYMBICORT) 80-4.5 MCG/ACT inhaler Inhale 2 puffs into the lungs 2 (two) times daily. 1 Inhaler 0  . carvedilol (COREG) 3.125 MG tablet TAKE 2 TABS BY MOUTH IN THE AM & 1 TABS BY MOUTH IN THE PM    . colchicine (COLCRYS) 0.6 MG tablet Take 0.6 mg by mouth daily as needed (gout).    . furosemide (LASIX) 20 MG tablet Take 3 tablets (60 mg total) by mouth daily. 90 tablet 3  . gabapentin (NEURONTIN) 300 MG capsule Take 300 mg by mouth 2 (two) times daily.    Marland Kitchen lubiprostone (AMITIZA) 24 MCG capsule Take 24 mcg by mouth 2 (two) times daily with a meal.     . Olopatadine HCl (PATADAY) 0.2 % SOLN Place 1 drop into both eyes 2 (two) times daily.    Marland Kitchen omeprazole (PRILOSEC) 20 MG capsule Take 20 mg by mouth daily.    . potassium chloride SA (K-DUR,KLOR-CON) 20 MEQ tablet Take 1 tablet (20 mEq total) by mouth daily. 30 tablet 0  . sacubitril-valsartan (ENTRESTO) 49-51 MG Take 1 tablet by mouth 2 (two) times  daily. 60 tablet 3  . spironolactone (ALDACTONE) 25 MG tablet Take 1 tablet (25 mg total) by mouth daily. 90 tablet 3  . tiotropium (SPIRIVA) 18 MCG inhalation capsule Place 18 mcg into inhaler and inhale daily.    . traMADol (ULTRAM) 50 MG tablet Take 50 mg by mouth every 6 (six) hours as needed for moderate pain.     No current facility-administered medications on file prior to visit.    Allergies  Allergen Reactions  . Penicillins Other (See Comments) and Nausea And Vomiting    Per Dr Audria Nine Via phone 12/27/12 0400 Swelling , long time ago.  Doesn't remember   Objective: General: Patient is awake, alert, and oriented x 3 and in no acute distress.  Integument: Skin is warm, dry and supple bilateral. Continued Moderate plantar>dorsal xerosis bilateral, improving in nature. Nails are tender, long, thickened and dystrophic with subungual debris, consistent with onychomycosis, 1-5 bilateral. No signs of infection. No open lesions or preulcerative lesions present bilateral. Minimal callus to 1st toesRemaining integument unremarkable.  Vasculature:  Dorsalis Pedis pulse 1/4 bilateral. Posterior Tibial pulse  0/4 bilateral.  Capillary fill time <3 sec 1-5 bilateral. Scant hair growth to the level of the digits. Temperature gradient within normal limits. No varicosities present bilateral. Mild trace edema present bilateral ankles.   Neurology: The patient has diminished sensation measured with a 5.07/10g Semmes Weinstein Monofilament at all pedal sites bilateral . Vibratory sensation diminished bilateral with tuning fork. No Babinski sign present bilateral.   Musculoskeletal: Asymptomatioc pes planus and bunion pedal deformities noted bilateral. Muscular strength 5/5 in all lower extremity muscular groups bilateral without pain or limitation on range of motion. No tenderness with calf compression bilateral.  Assessment and Plan: Problem List Items Addressed This Visit      Other   Pain in limb-Right Leg/foot    Other Visit Diagnoses    Dermatophytosis of nail    -  Primary   Type 2 diabetes, controlled, with neuropathy (HCC)       Peripheral vascular disease ([place])       Xerosis of skin       Pes planus of both feet       Bunion         -Examined patient. -Discussed and educated patient on diabetic foot care, especially with regards to the vascular, neurological and musculoskeletal systems.  -Stressed the importance of good glycemic control and the detriment of not controlling glucose levels in relation to the foot. -Mechanically  debrided all nails 1-5 bilateral using sterile nail nipper and filed with dremel without incident  -Recommend again to patient to continue with OTC skin emollient for dry skin daily -[employer] diabetic shoe order form was completed; office to contact primary care for approval / certification;  Office to arrange shoe fitting and dispensing. -Patient to return in 3 months for at risk foot care  -Patient advised to call the office if any problems or questions arise in the meantime.  Landis Martins, DPM

## 2017-12-09 ENCOUNTER — Ambulatory Visit (INDEPENDENT_AMBULATORY_CARE_PROVIDER_SITE_OTHER): Payer: Medicare HMO | Admitting: *Deleted

## 2017-12-09 DIAGNOSIS — I255 Ischemic cardiomyopathy: Secondary | ICD-10-CM

## 2017-12-10 ENCOUNTER — Encounter: Payer: Self-pay | Admitting: Cardiology

## 2017-12-10 NOTE — Progress Notes (Signed)
Remote ICD transmission.   

## 2017-12-20 DIAGNOSIS — R32 Unspecified urinary incontinence: Secondary | ICD-10-CM | POA: Diagnosis not present

## 2017-12-20 DIAGNOSIS — I509 Heart failure, unspecified: Secondary | ICD-10-CM | POA: Diagnosis not present

## 2017-12-21 DIAGNOSIS — M25551 Pain in right hip: Secondary | ICD-10-CM | POA: Diagnosis not present

## 2017-12-22 DIAGNOSIS — M87051 Idiopathic aseptic necrosis of right femur: Secondary | ICD-10-CM | POA: Diagnosis not present

## 2017-12-28 DIAGNOSIS — R32 Unspecified urinary incontinence: Secondary | ICD-10-CM | POA: Diagnosis not present

## 2017-12-28 DIAGNOSIS — I509 Heart failure, unspecified: Secondary | ICD-10-CM | POA: Diagnosis not present

## 2017-12-28 LAB — CUP PACEART REMOTE DEVICE CHECK
Battery Voltage: 2.95 V
Brady Statistic RV Percent Paced: 1 %
HighPow Impedance: 70 Ohm
HighPow Impedance: 70 Ohm
Implantable Lead Implant Date: 20160115
Implantable Lead Location: 753860
Implantable Pulse Generator Implant Date: 20160115
Lead Channel Pacing Threshold Amplitude: 1 V
Lead Channel Pacing Threshold Pulse Width: 0.5 ms
Lead Channel Setting Sensing Sensitivity: 0.5 mV
MDC IDC MSMT BATTERY REMAINING LONGEVITY: 71 mo
MDC IDC MSMT BATTERY REMAINING PERCENTAGE: 70 %
MDC IDC MSMT LEADCHNL RV IMPEDANCE VALUE: 360 Ohm
MDC IDC MSMT LEADCHNL RV SENSING INTR AMPL: 4.6 mV
MDC IDC PG SERIAL: 7233603
MDC IDC SESS DTM: 20190620143426
MDC IDC SET LEADCHNL RV PACING AMPLITUDE: 2.5 V
MDC IDC SET LEADCHNL RV PACING PULSEWIDTH: 0.5 ms

## 2017-12-30 ENCOUNTER — Encounter: Payer: Self-pay | Admitting: Sports Medicine

## 2017-12-30 ENCOUNTER — Ambulatory Visit: Payer: Medicare HMO | Admitting: *Deleted

## 2017-12-30 DIAGNOSIS — E1140 Type 2 diabetes mellitus with diabetic neuropathy, unspecified: Secondary | ICD-10-CM

## 2018-01-07 DIAGNOSIS — Z6833 Body mass index (BMI) 33.0-33.9, adult: Secondary | ICD-10-CM | POA: Diagnosis not present

## 2018-01-07 DIAGNOSIS — I739 Peripheral vascular disease, unspecified: Secondary | ICD-10-CM | POA: Diagnosis not present

## 2018-01-07 DIAGNOSIS — M87051 Idiopathic aseptic necrosis of right femur: Secondary | ICD-10-CM | POA: Diagnosis not present

## 2018-01-07 DIAGNOSIS — E1140 Type 2 diabetes mellitus with diabetic neuropathy, unspecified: Secondary | ICD-10-CM | POA: Diagnosis not present

## 2018-01-07 DIAGNOSIS — E119 Type 2 diabetes mellitus without complications: Secondary | ICD-10-CM | POA: Diagnosis not present

## 2018-01-11 DIAGNOSIS — R32 Unspecified urinary incontinence: Secondary | ICD-10-CM | POA: Diagnosis not present

## 2018-01-11 DIAGNOSIS — I509 Heart failure, unspecified: Secondary | ICD-10-CM | POA: Diagnosis not present

## 2018-01-19 DIAGNOSIS — M87051 Idiopathic aseptic necrosis of right femur: Secondary | ICD-10-CM | POA: Diagnosis not present

## 2018-01-27 ENCOUNTER — Telehealth: Payer: Self-pay | Admitting: Sports Medicine

## 2018-01-27 DIAGNOSIS — R32 Unspecified urinary incontinence: Secondary | ICD-10-CM | POA: Diagnosis not present

## 2018-01-27 NOTE — Telephone Encounter (Signed)
Pt called and left message for me about diabetic shoes.   I returned call and explained we where waiting on paperwork from Dr [facility] like she was out of the office). We did get signed paperwork on 8.6.19 and it shows them back ordered until 8.20.19. I told pt he would get a call when they arrived to schedule an appt.

## 2018-02-18 ENCOUNTER — Other Ambulatory Visit (HOSPITAL_COMMUNITY): Payer: Self-pay | Admitting: Internal Medicine

## 2018-03-04 ENCOUNTER — Encounter: Payer: Self-pay | Admitting: Sports Medicine

## 2018-03-04 ENCOUNTER — Ambulatory Visit (INDEPENDENT_AMBULATORY_CARE_PROVIDER_SITE_OTHER): Payer: Medicare Other | Admitting: Sports Medicine

## 2018-03-04 DIAGNOSIS — I739 Peripheral vascular disease, unspecified: Secondary | ICD-10-CM

## 2018-03-04 DIAGNOSIS — M79676 Pain in unspecified toe(s): Secondary | ICD-10-CM

## 2018-03-04 DIAGNOSIS — E1140 Type 2 diabetes mellitus with diabetic neuropathy, unspecified: Secondary | ICD-10-CM | POA: Diagnosis not present

## 2018-03-04 DIAGNOSIS — B351 Tinea unguium: Secondary | ICD-10-CM

## 2018-03-04 DIAGNOSIS — L853 Xerosis cutis: Secondary | ICD-10-CM | POA: Diagnosis not present

## 2018-03-04 NOTE — Progress Notes (Signed)
Patient ID: [name] [name], male   DOB: 03-20-1951, 67 y.o.   MRN: [id number]  Subjective: [name] [name] is a 67 y.o. male patient with history of type 2 diabetes who returns to office today to pick up diabetic shoes and complaining of long, painful nails  while ambulating in shoes, unable to trim. Patient states that the glucose reading today was not recorded but yesterday was 100. Patient denies any new changes in medication or new problems. Last PCP visit was 3 months ago. Patient Active Problem List   Diagnosis Date Noted  . Chest pain 04/08/2015  . Abnormal nuclear stress test 04/08/2015  . Ischemic cardiomyopathy 07/06/2014  . Lumbago 03/13/2014  . Special screening for malignant neoplasms, colon 02/09/2014  . Normocytic anemia 02/09/2014  . Pleural effusion on left 11/28/2013  . Atherosclerosis of native arteries of extremity with intermittent claudication ([place]) 11/07/2013  . Weakness of both legs 11/07/2013  . Smoker 09/19/2013  . Numbness in right leg/ Foot 06/14/2013  . Pain in limb-Right Leg/foot 06/14/2013  . Atherosclerosis of native arteries of the extremities with ulceration(440.23) 06/14/2013  . Chronic systolic heart failure ([place]) 05/29/2013  . Atrial fibrillation ([place]) 05/29/2013  . Chest pain, mid sternal 05/19/2013  . S/P CABG x 5 05/08/2013  . Right foot ulcer ([place]) 05/04/2013  . Coronary atherosclerosis of native coronary artery 04/28/2013  . Acute systolic heart failure ([place]) 04/28/2013  . Abnormal blood chemistry 12/30/2012  . Abnormal LFTs 12/30/2012  . Aspiration pneumonia ([place]) 12/30/2012  . Chronic congestive heart failure ([place]) 12/27/2012  . Leg weakness 12/26/2012  . Pneumonia 05/18/2011  . Gout 05/17/2011  . Respiratory distress, acute 05/17/2011  . Hypertension 05/17/2011  . COPD (chronic obstructive pulmonary disease) ([place]) 05/17/2011  . CHF, acute ([place]) 05/17/2011   Current Outpatient Medications on File Prior to Visit  Medication Sig Dispense Refill  .  albuterol (PROVENTIL HFA;VENTOLIN HFA) 108 (90 BASE) MCG/ACT inhaler Inhale 1 puff into the lungs every 6 (six) hours as needed for wheezing or shortness of breath.    . allopurinol (ZYLOPRIM) 300 MG tablet Take 300 mg by mouth daily.    Marland Kitchen aspirin EC 81 MG tablet TAKE ONE TABLET BY MOUTH ONCE DAILY 30 tablet 3  . atorvastatin (LIPITOR) 20 MG tablet Take 20 mg by mouth daily.    Marland Kitchen b complex vitamins tablet Take 1 tablet by mouth daily.    . budesonide-formoterol (SYMBICORT) 80-4.5 MCG/ACT inhaler Inhale 2 puffs into the lungs 2 (two) times daily. 1 Inhaler 0  . carvedilol (COREG) 3.125 MG tablet TAKE 2 TABS BY MOUTH IN THE AM & 1 TABS BY MOUTH IN THE PM    . colchicine (COLCRYS) 0.6 MG tablet Take 0.6 mg by mouth daily as needed (gout).    Marland Kitchen ENTRESTO 49-51 MG TAKE 1 TABLET BY MOUTH TWICE A DAY 60 tablet 3  . furosemide (LASIX) 20 MG tablet Take 3 tablets (60 mg total) by mouth daily. 90 tablet 3  . gabapentin (NEURONTIN) 300 MG capsule Take 300 mg by mouth 2 (two) times daily.    Marland Kitchen lubiprostone (AMITIZA) 24 MCG capsule Take 24 mcg by mouth 2 (two) times daily with a meal.     . Olopatadine HCl (PATADAY) 0.2 % SOLN Place 1 drop into both eyes 2 (two) times daily.    Marland Kitchen omeprazole (PRILOSEC) 20 MG capsule Take 20 mg by mouth daily.    . potassium chloride SA (K-DUR,KLOR-CON) 20 MEQ tablet Take 1 tablet (20 mEq total) by  mouth daily. 30 tablet 0  . spironolactone (ALDACTONE) 25 MG tablet Take 1 tablet (25 mg total) by mouth daily. 90 tablet 3  . tiotropium (SPIRIVA) 18 MCG inhalation capsule Place 18 mcg into inhaler and inhale daily.    . traMADol (ULTRAM) 50 MG tablet Take 50 mg by mouth every 6 (six) hours as needed for moderate pain.     No current facility-administered medications on file prior to visit.    Allergies  Allergen Reactions  . Penicillins Other (See Comments) and Nausea And Vomiting    Per Dr Audria Nine Via phone 12/27/12 0400 Swelling , long time ago. Doesn't remember    Objective: General: Patient is awake, alert, and oriented x 3 and in no acute distress.  Integument: Skin is warm, dry and supple bilateral. Continued Moderate plantar>dorsal xerosis bilateral, improving in nature. Nails are tender, long, thickened and dystrophic with subungual debris, consistent with onychomycosis, 1-5 bilateral. No signs of infection. No open lesions or preulcerative lesions present bilateral. Minimal callus to 1st toesRemaining integument unremarkable.  Vasculature:  Dorsalis Pedis pulse 1/4 bilateral. Posterior Tibial pulse  0/4 bilateral.  Capillary fill time <3 sec 1-5 bilateral. Scant hair growth to the level of the digits. Temperature gradient within normal limits. No varicosities present bilateral. Mild trace edema present bilateral ankles.   Neurology: The patient has diminished sensation measured with a 5.07/10g Semmes Weinstein Monofilament at all pedal sites bilateral . Vibratory sensation diminished bilateral with tuning fork. No Babinski sign present bilateral.   Musculoskeletal: Asymptomatioc pes planus and bunion pedal deformities noted bilateral. Muscular strength 5/5 in all lower extremity muscular groups bilateral without pain or limitation on range of motion. No tenderness with calf compression bilateral.  Diabetic shoes fit appropriately and provide adequate support patient to able to walk in office 10 feet without pain or symptoms  Assessment and Plan: Problem List Items Addressed This Visit      Other   Pain in limb-Right Leg/foot    Other Visit Diagnoses    Dermatophytosis of nail    -  Primary   Type 2 diabetes, controlled, with neuropathy ([place])       Peripheral vascular disease ([place])       Xerosis of skin         -Examined patient. -Discussed and educated patient on diabetic foot care, especially with regards to the vascular, neurological and musculoskeletal systems.  -Stressed the importance of good glycemic control and the detriment of  not controlling glucose levels in relation to the foot. -Mechanically debrided all nails 1-5 bilateral using sterile nail nipper and filed with dremel without incident  -Recommend again to patient to continue with OTC skin emollient for dry skin daily -Diabetic shoes dispensed to patient with wear explained and comfort and support noted with no problems or complications -Patient to return in 3 months for at risk foot care  -Patient advised to call the office if any problems or questions arise in the meantime.  Landis Martins, DPM

## 2018-03-10 ENCOUNTER — Ambulatory Visit (INDEPENDENT_AMBULATORY_CARE_PROVIDER_SITE_OTHER): Payer: Medicare Other | Admitting: *Deleted

## 2018-03-10 ENCOUNTER — Telehealth: Payer: Self-pay | Admitting: Cardiology

## 2018-03-10 DIAGNOSIS — I255 Ischemic cardiomyopathy: Secondary | ICD-10-CM | POA: Diagnosis not present

## 2018-03-10 DIAGNOSIS — I5022 Chronic systolic (congestive) heart failure: Secondary | ICD-10-CM

## 2018-03-10 NOTE — Progress Notes (Signed)
Remote ICD transmission.   

## 2018-03-10 NOTE — Telephone Encounter (Signed)
Spoke with pt and reminded pt of remote transmission that is due today. Pt verbalized understanding.   

## 2018-03-18 ENCOUNTER — Other Ambulatory Visit: Payer: Self-pay | Admitting: Orthopedic Surgery

## 2018-03-18 DIAGNOSIS — M25551 Pain in right hip: Secondary | ICD-10-CM

## 2018-03-23 LAB — CUP PACEART REMOTE DEVICE CHECK
Battery Remaining Percentage: 67 %
Brady Statistic RV Percent Paced: 1 %
Date Time Interrogation Session: 20190919154741
HIGH POWER IMPEDANCE MEASURED VALUE: 69 Ohm
HighPow Impedance: 69 Ohm
Implantable Lead Implant Date: 20160115
Implantable Lead Location: 753860
Lead Channel Impedance Value: 380 Ohm
Lead Channel Pacing Threshold Amplitude: 1 V
Lead Channel Pacing Threshold Pulse Width: 0.5 ms
MDC IDC MSMT BATTERY REMAINING LONGEVITY: 68 mo
MDC IDC MSMT BATTERY VOLTAGE: 2.95 V
MDC IDC MSMT LEADCHNL RV SENSING INTR AMPL: 5.1 mV
MDC IDC PG IMPLANT DT: 20160115
MDC IDC SET LEADCHNL RV PACING AMPLITUDE: 2.5 V
MDC IDC SET LEADCHNL RV PACING PULSEWIDTH: 0.5 ms
MDC IDC SET LEADCHNL RV SENSING SENSITIVITY: 0.5 mV
Pulse Gen Serial Number: 7233603

## 2018-03-29 ENCOUNTER — Other Ambulatory Visit: Payer: Self-pay

## 2018-03-30 ENCOUNTER — Ambulatory Visit
Admission: RE | Admit: 2018-03-30 | Discharge: 2018-03-30 | Disposition: A | Discharge status: Home / Self Care | Payer: Medicare Other | Source: Ambulatory Visit | Attending: Orthopedic Surgery | Admitting: Orthopedic Surgery

## 2018-03-30 DIAGNOSIS — M25551 Pain in right hip: Secondary | ICD-10-CM

## 2018-04-28 ENCOUNTER — Telehealth: Payer: Self-pay

## 2018-04-28 ENCOUNTER — Telehealth: Payer: Self-pay | Admitting: Internal Medicine

## 2018-04-28 ENCOUNTER — Other Ambulatory Visit (HOSPITAL_COMMUNITY): Payer: Self-pay | Admitting: Internal Medicine

## 2018-04-28 NOTE — Telephone Encounter (Signed)
   [facility] Pre-operative Risk Assessment    Request for surgical clearance:  1. What type of surgery is being performed? Right Total Hip Arthroplasty   2. When is this surgery scheduled? TBD   3. What type of clearance is required (medical clearance vs. Pharmacy clearance to hold med vs. Both)? Both  4. Are there any medications that need to be held prior to surgery and how long? Patient on Aspirin   5. Practice name and name of physician performing surgery? [facility], Dr. Lyla Glassing   6. What is your office phone number [phone]    7.   What is your office fax number [phone] Attn: Santiago Bur  8.   Anesthesia type (None, local, MAC, general) ? Spinal   [name] [name] 04/28/2018, 1:34 PM  _________________________________________________________________

## 2018-04-28 NOTE — Telephone Encounter (Signed)
Walk In pt Form-Clearance dropped off. Placed in Triage box

## 2018-04-28 NOTE — Telephone Encounter (Signed)
   Primary Cardiologist:Dalton Aundra Dubin, MD  Chart reviewed as part of pre-operative protocol coverage. Because of Parvin Stetzer past medical history and time since last visit, he/she will require a follow-up visit in order to better assess preoperative cardiovascular risk.  The patient was last seen in 08/2017 by Dr. Caryl Comes who advised he follow up with the CHF clinic in 3 months (11/2017). He did not show up for that appointment. It needs to be rescheduled before clearing for surgery.  Pre-op covering staff: - Please schedule appointment and call patient to inform them. WITH CHF CLINIC - Please contact requesting surgeon's office via preferred method (i.e, phone, fax) to inform them of need for appointment prior to surgery.   Charlie Pitter, PA-C  04/28/2018, 4:18 PM

## 2018-04-29 NOTE — Telephone Encounter (Signed)
Spoke with pt re: surgical clearance.  Pt has been made aware that he missed an apt at CHF clinic and that he would need to be seen there before we could grant clearance. Pt has been given the # to call and reschedule. Pt thanked me for the call.

## 2018-05-05 ENCOUNTER — Ambulatory Visit (HOSPITAL_COMMUNITY)
Admission: RE | Admit: 2018-05-05 | Discharge: 2018-05-05 | Disposition: A | Discharge status: Home / Self Care | Payer: Medicare Other | Source: Ambulatory Visit | Attending: Cardiology | Admitting: Cardiology

## 2018-05-05 ENCOUNTER — Other Ambulatory Visit: Payer: Self-pay

## 2018-05-05 ENCOUNTER — Encounter (HOSPITAL_COMMUNITY): Payer: Self-pay

## 2018-05-05 VITALS — BP 110/76 | HR 77 | Wt 225.4 lb

## 2018-05-05 DIAGNOSIS — I255 Ischemic cardiomyopathy: Secondary | ICD-10-CM | POA: Diagnosis not present

## 2018-05-05 DIAGNOSIS — E1151 Type 2 diabetes mellitus with diabetic peripheral angiopathy without gangrene: Secondary | ICD-10-CM | POA: Diagnosis not present

## 2018-05-05 DIAGNOSIS — M109 Gout, unspecified: Secondary | ICD-10-CM | POA: Insufficient documentation

## 2018-05-05 DIAGNOSIS — Z833 Family history of diabetes mellitus: Secondary | ICD-10-CM | POA: Insufficient documentation

## 2018-05-05 DIAGNOSIS — F17210 Nicotine dependence, cigarettes, uncomplicated: Secondary | ICD-10-CM | POA: Diagnosis not present

## 2018-05-05 DIAGNOSIS — I5022 Chronic systolic (congestive) heart failure: Secondary | ICD-10-CM | POA: Diagnosis present

## 2018-05-05 DIAGNOSIS — M79676 Pain in unspecified toe(s): Secondary | ICD-10-CM

## 2018-05-05 DIAGNOSIS — Z7982 Long term (current) use of aspirin: Secondary | ICD-10-CM | POA: Diagnosis not present

## 2018-05-05 DIAGNOSIS — R0789 Other chest pain: Secondary | ICD-10-CM

## 2018-05-05 DIAGNOSIS — Z79899 Other long term (current) drug therapy: Secondary | ICD-10-CM | POA: Insufficient documentation

## 2018-05-05 DIAGNOSIS — Z95810 Presence of automatic (implantable) cardiac defibrillator: Secondary | ICD-10-CM | POA: Diagnosis not present

## 2018-05-05 DIAGNOSIS — M25551 Pain in right hip: Secondary | ICD-10-CM | POA: Diagnosis not present

## 2018-05-05 DIAGNOSIS — I110 Hypertensive heart disease with heart failure: Secondary | ICD-10-CM | POA: Insufficient documentation

## 2018-05-05 DIAGNOSIS — J449 Chronic obstructive pulmonary disease, unspecified: Secondary | ICD-10-CM | POA: Insufficient documentation

## 2018-05-05 DIAGNOSIS — I2510 Atherosclerotic heart disease of native coronary artery without angina pectoris: Secondary | ICD-10-CM | POA: Insufficient documentation

## 2018-05-05 DIAGNOSIS — E785 Hyperlipidemia, unspecified: Secondary | ICD-10-CM | POA: Insufficient documentation

## 2018-05-05 DIAGNOSIS — R079 Chest pain, unspecified: Secondary | ICD-10-CM

## 2018-05-05 DIAGNOSIS — F17200 Nicotine dependence, unspecified, uncomplicated: Secondary | ICD-10-CM

## 2018-05-05 DIAGNOSIS — Z951 Presence of aortocoronary bypass graft: Secondary | ICD-10-CM | POA: Diagnosis not present

## 2018-05-05 DIAGNOSIS — Z8249 Family history of ischemic heart disease and other diseases of the circulatory system: Secondary | ICD-10-CM | POA: Diagnosis not present

## 2018-05-05 LAB — BASIC METABOLIC PANEL
Anion gap: 6 (ref 5–15)
BUN: 35 mg/dL — ABNORMAL HIGH (ref 8–23)
CO2: 23 mmol/L (ref 22–32)
CREATININE: 2.05 mg/dL — AB (ref 0.61–1.24)
Calcium: 9.5 mg/dL (ref 8.9–10.3)
Chloride: 110 mmol/L (ref 98–111)
GFR calc non Af Amer: 32 mL/min — ABNORMAL LOW (ref >60)
GFR, EST AFRICAN AMERICAN: 37 mL/min — AB (ref >60)
Glucose, Bld: 98 mg/dL (ref 70–99)
Potassium: 4.6 mmol/L (ref 3.5–5.1)
SODIUM: 139 mmol/L (ref 135–145)

## 2018-05-05 NOTE — Patient Instructions (Signed)
Labs today We will only contact you if something comes back abnormal or we need to make some changes. Otherwise no news is good news!  Your physician has requested that you have a lexiscan myoview. For further information please visit [email or web address]. Please follow instruction sheet, as given.  Your physician has requested that you have an ankle brachial index (ABI). During this test an ultrasound and blood pressure cuff are used to evaluate the arteries that supply the arms and legs with blood. Allow thirty minutes for this exam. There are no restrictions or special instructions.  Your physician has requested that you have an echocardiogram. Echocardiography is a painless test that uses sound waves to create images of your heart. It provides your doctor with information about the size and shape of your heart and how well your heart's chambers and valves are working. This procedure takes approximately one hour. There are no restrictions for this procedure.   Your physician recommends that you schedule a follow-up appointment in: 3 weeks with Dr Aundra Dubin  Do the following things EVERYDAY: 1) Weigh yourself in the morning before breakfast. Write it down and keep it in a log. 2) Take your medicines as prescribed 3) Eat low salt foods-Limit salt (sodium) to 2000 mg per day.  4) Stay as active as you can everyday 5) Limit all fluids for the day to less than 2 liters

## 2018-05-05 NOTE — Progress Notes (Signed)
Patient ID: [name] [name], male   DOB: 14-Jun-1951, 67 y.o.   MRN: [id number] PCP: [name] [facility]) Cardiac Surgeon: Dr Roxan Hockey Pulmonologist: Dr. Lamonte Sakai Cardiology: Dr. Aundra Dubin  HPI: [name] [name] is a 67 yo with history of COPD, HTN, 3V CAD s/p CABG x 5  (04/2013: LIMA to LAD, SVG to second diagonal, SVG to ramus intermediate and obtuse marginal 2, SVG to posterior descending), ICM, chronic systolic HF, COPD,DM2, PVD and tobacco abuse.  Today he returns for HF follow up. Overall feeling fine. Say he took 1 sublingual nitro about 1 month ago for chest discomfort. Chest discomfort went away with sublingual nitro. SOB walking up steps. Denies PND/Orthopnea. Tries to walk but starts having pain in his right calf when he walks.  Appetite ok. No fever or chills. He has not been weighing at home.  Taking all medications. Lives with his girlfriend. Disabled for almost 3 years. Smokes 1/2 PPD.     ECHO 06/26/13 EF 20-25% RV mild HK ECHO 10/10/13 EF 20-25% restrictive filling pattern. Moderate RV dysfunction. Mild MR. Cardiolite (10/16) with EF 19%, anterior ischemia.  LHC (10/16) with patent LIMA-LAD and 60% pLAD stenosis (competitive flow). SVG-D, sequential SVG-ramus/OM2, SVG-PDA all patent. Medical management.  Echo 7/17 EF 30-35%  St Jude ICD placed by Dr Caryl Comes.   ABIs 11/07/13 - RABI .63 moderate arterial occlusive dz,  - LABI .78 moderate arterial occlusive dz - R toe BI <.69 abnl - L toe BI < .69 abnl ABIs 9/16 - RABI .68 - LABI 0.75   SH: Lives with his girlfriend.  Smokes 1/2 ppd. Does not drink alcohol  FH: 2 brothers died from heart attacks, Mother DM, Dad HTN   ROS: All systems negative except as listed in HPI, PMH and Problem List.  Past Medical History:  Diagnosis Date  . CAD (coronary artery disease)    a. 04/2013 Cath: LM 10, LAD 40p, 91m, D1 50p, LCX 60p, 76m/100m, OM1 50, OM2 100 L-L collats, RCA 100p;  b. CABG x 5: LIMA->LAD, VG->D2, VG->RI->OM2, VG->PDA.  Marland Kitchen Chronic  systolic CHF (congestive heart failure) ([employer])    a. 04/2013 Echo: EF 15-20%, diff HK, sev HK of inf and ant myocardium, dilated LA,  b. EF 20-25% with restrictive filling pattern, mod RV dysfx, mild MR (10/10/2013);  c.  Echo 12/15: EF 25-30%  . COPD (chronic obstructive pulmonary disease) ([place])   . Diabetes mellitus without complication ([place])   . Emphysema   . Gout   . Hyperlipidemia   . Hypertension   . Ischemic cardiomyopathy   . PVD (peripheral vascular disease) ([place])    a. (10/2013) ABIs: RIGHT 0.63, Waveforms: monophasic;  LEFT 0.78, Waveforms: monophasic  . Tobacco abuse    30+ pack-year history  . Transaminitis    a. 04/2013 Abd U/S and CT unremarkable, hepatitis panel neg -->felt to be 2/2 acute R heart failure.    Current Outpatient Medications  Medication Sig Dispense Refill  . albuterol (PROVENTIL HFA;VENTOLIN HFA) 108 (90 BASE) MCG/ACT inhaler Inhale 1 puff into the lungs every 6 (six) hours as needed for wheezing or shortness of breath.    . allopurinol (ZYLOPRIM) 300 MG tablet Take 300 mg by mouth daily.    Marland Kitchen aspirin EC 81 MG tablet TAKE ONE TABLET BY MOUTH ONCE DAILY 30 tablet 3  . atorvastatin (LIPITOR) 20 MG tablet Take 20 mg by mouth daily.    Marland Kitchen b complex vitamins tablet Take 1 tablet by mouth daily.    Marland Kitchen  budesonide-formoterol (SYMBICORT) 80-4.5 MCG/ACT inhaler Inhale 2 puffs into the lungs 2 (two) times daily as needed.    . carvedilol (COREG) 3.125 MG tablet TAKE 2 TABS BY MOUTH IN THE AM & 1 TABS BY MOUTH IN THE PM    . colchicine (COLCRYS) 0.6 MG tablet Take 0.6 mg by mouth daily as needed (gout).    Marland Kitchen ENTRESTO 49-51 MG TAKE 1 TABLET BY MOUTH TWICE A DAY 60 tablet 3  . furosemide (LASIX) 20 MG tablet Take 20 mg by mouth 2 (two) times daily.    Marland Kitchen gabapentin (NEURONTIN) 300 MG capsule Take 300 mg by mouth 2 (two) times daily.    Marland Kitchen lubiprostone (AMITIZA) 24 MCG capsule Take 24 mcg by mouth 2 (two) times daily with a meal.     . Olopatadine HCl (PATADAY) 0.2 % SOLN  Place 1 drop into both eyes 2 (two) times daily.    Marland Kitchen omeprazole (PRILOSEC) 20 MG capsule Take 20 mg by mouth daily.    . potassium chloride SA (K-DUR,KLOR-CON) 20 MEQ tablet Take 1 tablet (20 mEq total) by mouth daily. 30 tablet 0  . spironolactone (ALDACTONE) 25 MG tablet Take 1 tablet (25 mg total) by mouth daily. 90 tablet 3  . tiotropium (SPIRIVA) 18 MCG inhalation capsule Place 18 mcg into inhaler and inhale daily.    . traMADol (ULTRAM) 50 MG tablet Take 50 mg by mouth every 6 (six) hours as needed for moderate pain.     No current facility-administered medications for this encounter.     Vitals:   05/05/18 1354  BP: 110/76  Pulse: 77  SpO2: 97%  Weight: 102.2 kg (225 lb 6.4 oz)    Wt Readings from Last 3 Encounters:  05/05/18 102.2 kg (225 lb 6.4 oz)  08/31/17 96.2 kg (212 lb)  04/14/17 96.7 kg (213 lb 1.6 oz)    PHYSICAL EXAM: General:  Well appearing. No resp difficulty Walks with cane HEENT: normal Neck: supple. no JVD. Carotids 2+ bilat; no bruits. No lymphadenopathy or thryomegaly appreciated. Cor: PMI laterally displaced. Regular rate & rhythm. No rubs, gallops or murmurs. Lungs: clear Abdomen: soft, nontender, nondistended. No hepatosplenomegaly. No bruits or masses. Good bowel sounds. Extremities: no cyanosis, clubbing, rash, edema Neuro: alert & orientedx3, cranial nerves grossly intact. moves all 4 extremities w/o difficulty. Affect pleasant   ASSESSMENT & PLAN:  1. CAD s/p CABG:  -- Had cath in 10/16 with patent grafts, medical management. - Recently had chest pain. Set up myoview. .  - Continue ASA 81 and statin.  2. Chronic systolic HF: EF 86% 7/54/49 echo.  EF was 19% by Cardiolite in 10/16. Echo 7/17 30-35%   He has a St Jude ICD -NYHA II-IIIb. Volume status stable.  Continue current Coreg and spironolactone. - Continue Entresto to 49/51  3. Current Smoker:  - Continues to smoke. Discussed smoking cessation.  4. PAD:  - Seems to be having more  claudication - ABIs in 03/2017 with moderate bilateral arterial occlusive disease.  - Set up ABI.  - He is on ASA and statin.  5. R hip pain Needs hip replacement. Need to repeat stress test and echo.     Follow up 3-4 weeks. Needs to have ECHO and myoview before he can be cleared for surgery.       NP-C  05/05/2018

## 2018-05-06 NOTE — Progress Notes (Signed)
Patient ID: [name] [name], male   DOB: Aug 29, 1950, 67 y.o.   MRN: [id number]  Patient presents at Dr Leeanne Rio request to be measured for diabetic shoes and inserts with [facility] Certified Pedorthist.  Patient will be called when shoes and inserts arrive to schedule a fitting.

## 2018-05-27 ENCOUNTER — Ambulatory Visit (HOSPITAL_COMMUNITY)
Admission: RE | Admit: 2018-05-27 | Discharge: 2018-05-27 | Disposition: A | Discharge status: Home / Self Care | Payer: Medicare Other | Source: Ambulatory Visit | Attending: Cardiology | Admitting: Cardiology

## 2018-05-27 VITALS — BP 110/76 | HR 75 | Wt 219.8 lb

## 2018-05-27 DIAGNOSIS — N183 Chronic kidney disease, stage 3 (moderate): Secondary | ICD-10-CM | POA: Insufficient documentation

## 2018-05-27 DIAGNOSIS — Z7982 Long term (current) use of aspirin: Secondary | ICD-10-CM | POA: Diagnosis not present

## 2018-05-27 DIAGNOSIS — F17210 Nicotine dependence, cigarettes, uncomplicated: Secondary | ICD-10-CM | POA: Insufficient documentation

## 2018-05-27 DIAGNOSIS — M109 Gout, unspecified: Secondary | ICD-10-CM | POA: Insufficient documentation

## 2018-05-27 DIAGNOSIS — I452 Bifascicular block: Secondary | ICD-10-CM | POA: Diagnosis not present

## 2018-05-27 DIAGNOSIS — I2510 Atherosclerotic heart disease of native coronary artery without angina pectoris: Secondary | ICD-10-CM | POA: Insufficient documentation

## 2018-05-27 DIAGNOSIS — Z833 Family history of diabetes mellitus: Secondary | ICD-10-CM | POA: Insufficient documentation

## 2018-05-27 DIAGNOSIS — I444 Left anterior fascicular block: Secondary | ICD-10-CM | POA: Diagnosis not present

## 2018-05-27 DIAGNOSIS — E1122 Type 2 diabetes mellitus with diabetic chronic kidney disease: Secondary | ICD-10-CM | POA: Diagnosis not present

## 2018-05-27 DIAGNOSIS — I130 Hypertensive heart and chronic kidney disease with heart failure and stage 1 through stage 4 chronic kidney disease, or unspecified chronic kidney disease: Secondary | ICD-10-CM | POA: Insufficient documentation

## 2018-05-27 DIAGNOSIS — Z951 Presence of aortocoronary bypass graft: Secondary | ICD-10-CM | POA: Diagnosis not present

## 2018-05-27 DIAGNOSIS — F17200 Nicotine dependence, unspecified, uncomplicated: Secondary | ICD-10-CM | POA: Diagnosis not present

## 2018-05-27 DIAGNOSIS — Z8249 Family history of ischemic heart disease and other diseases of the circulatory system: Secondary | ICD-10-CM | POA: Insufficient documentation

## 2018-05-27 DIAGNOSIS — M1990 Unspecified osteoarthritis, unspecified site: Secondary | ICD-10-CM | POA: Diagnosis not present

## 2018-05-27 DIAGNOSIS — R9431 Abnormal electrocardiogram [ECG] [EKG]: Secondary | ICD-10-CM | POA: Insufficient documentation

## 2018-05-27 DIAGNOSIS — M25551 Pain in right hip: Secondary | ICD-10-CM | POA: Insufficient documentation

## 2018-05-27 DIAGNOSIS — E785 Hyperlipidemia, unspecified: Secondary | ICD-10-CM | POA: Insufficient documentation

## 2018-05-27 DIAGNOSIS — I5022 Chronic systolic (congestive) heart failure: Secondary | ICD-10-CM | POA: Diagnosis not present

## 2018-05-27 DIAGNOSIS — I739 Peripheral vascular disease, unspecified: Secondary | ICD-10-CM

## 2018-05-27 DIAGNOSIS — I255 Ischemic cardiomyopathy: Secondary | ICD-10-CM | POA: Diagnosis not present

## 2018-05-27 DIAGNOSIS — R079 Chest pain, unspecified: Secondary | ICD-10-CM

## 2018-05-27 DIAGNOSIS — J449 Chronic obstructive pulmonary disease, unspecified: Secondary | ICD-10-CM | POA: Insufficient documentation

## 2018-05-27 DIAGNOSIS — Z79899 Other long term (current) drug therapy: Secondary | ICD-10-CM | POA: Diagnosis not present

## 2018-05-27 DIAGNOSIS — E1151 Type 2 diabetes mellitus with diabetic peripheral angiopathy without gangrene: Secondary | ICD-10-CM | POA: Insufficient documentation

## 2018-05-27 DIAGNOSIS — I4510 Unspecified right bundle-branch block: Secondary | ICD-10-CM | POA: Insufficient documentation

## 2018-05-27 LAB — LIPID PANEL
Cholesterol: 109 mg/dL (ref 0–200)
HDL: 32 mg/dL — ABNORMAL LOW (ref >40)
LDL Cholesterol: 57 mg/dL (ref 0–99)
Total CHOL/HDL Ratio: 3.4 RATIO
Triglycerides: 101 mg/dL (ref <150)
VLDL: 20 mg/dL (ref 0–40)

## 2018-05-27 LAB — BASIC METABOLIC PANEL
Anion gap: 11 (ref 5–15)
BUN: 33 mg/dL — AB (ref 8–23)
CALCIUM: 9.5 mg/dL (ref 8.9–10.3)
CO2: 21 mmol/L — ABNORMAL LOW (ref 22–32)
Chloride: 107 mmol/L (ref 98–111)
Creatinine, Ser: 2.07 mg/dL — ABNORMAL HIGH (ref 0.61–1.24)
GFR calc Af Amer: 38 mL/min — ABNORMAL LOW (ref >60)
GFR, EST NON AFRICAN AMERICAN: 32 mL/min — AB (ref >60)
GLUCOSE: 96 mg/dL (ref 70–99)
Potassium: 4.3 mmol/L (ref 3.5–5.1)
Sodium: 139 mmol/L (ref 135–145)

## 2018-05-27 MED ORDER — CARVEDILOL 6.25 MG PO TABS: 6.2500 mg | ORAL_TABLET | Freq: Two times a day (BID) | ORAL | 6 refills | Status: DC

## 2018-05-27 NOTE — Patient Instructions (Addendum)
INCREASE Coreg to 6.25mg  (1 tab) twice day  Labs today We will only contact you if something comes back abnormal or we need to make some changes. Otherwise no news is good news!  You have been ordered for Dopplers of your lower extremities, Lexiscan of your heart, and an ECHO.  Please have them done as soon as possible. They will contact you to schedule your appointment.   Your physician recommends that you schedule a follow-up appointment in: 3 weeks with Pharmacy and 6 weeks with Dr. Aundra Dubin.  Your physician has requested that you have an echocardiogram. Echocardiography is a painless test that uses sound waves to create images of your heart. It provides your doctor with information about the size and shape of your heart and how well your heart's chambers and valves are working. This procedure takes approximately one hour. There are no restrictions for this procedure.

## 2018-05-29 NOTE — Progress Notes (Signed)
Patient ID: [name] [name], male   DOB: 12/03/1950, 67 y.o.   MRN: [id number] PCP: [name] [facility]) Cardiac Surgeon: Dr Roxan Hockey Pulmonologist: Dr. Lamonte Sakai Cardiology: Dr. Aundra Dubin  HPI: Mr. Debski is a 67 yo with history of COPD, HTN, 3V CAD s/p CABG x 5  (CABG 04/2013: LIMA to LAD, SVG to second diagonal, SVG to ramus intermediate and obtuse marginal 2, SVG to posterior descending), ischemic cardiomyopathy, chronic systolic HF, COPD, DM2, PVD and tobacco abuse.  He returns for followup of CHF and CAD. Still smoking 1/2 ppd.  Having significant right hip pain and says that R THR was recommended.  He saw NP in this office about a month ago and reported an occasional chest pain.  Cardiolite, echo, and ABIs were ordered but were never done. He has not had any further chest pain since last appointment.  He is short of breath walking up stairs but does ok on flat ground.  He walks about a mile for exercise daily. His calves will hurt bilaterally after walking 1/4-1/2 miles.  Weight is down 6 lbs.   Labs (11/19): K 4.6, creatinine 2.05  ECG (personally reviewed): NSR, RBBB, LAFB, inferior Qs    PMH: 1. Chronic systolic CHF: Ischemic cardiomyopathy.  St Jude ICD.  - ECHO 06/26/13 EF 20-25% RV mild HK - ECHO 10/10/13 EF 20-25% restrictive filling pattern. Moderate RV dysfunction. Mild MR. - Cardiolite (10/16) with EF 19%, anterior ischemia.  - Echo 7/17 EF 30-35% 2. CAD: CABG x 5 11/14 with LIMA-LAD, SVG-D2, seq SVG-ramus/OM2, SVG-PDA.  - LHC (10/16) with patent LIMA-LAD and 60% pLAD stenosis (competitive flow). SVG-D, sequential SVG-ramus/OM2, SVG-PDA all patent. Medical management.  3. COPD: Active smoker.  4. PAD:  - ABIs 11/07/13: RABI .63 moderate arterial occlusive dz,  LABI .78 moderate arterial occlusive dz - ABIs 9/16: RABI .68, LABI 0.75 5. Type 2 diabetes 6. Hyperlipidemia 7. Osteoarthritis 8. Gout  9. Hyperlipidemia 10.  CKD: Stage 3.   SH: Lives with his girlfriend.  Smokes 1/2  ppd. Does not drink alcohol.   FH: 2 brothers died from heart attacks, Mother DM, Dad HTN   ROS: All systems negative except as listed in HPI, PMH and Problem List.  Current Outpatient Medications  Medication Sig Dispense Refill  . albuterol (PROVENTIL HFA;VENTOLIN HFA) 108 (90 BASE) MCG/ACT inhaler Inhale 1 puff into the lungs every 6 (six) hours as needed for wheezing or shortness of breath.    . allopurinol (ZYLOPRIM) 300 MG tablet Take 300 mg by mouth daily.    Marland Kitchen aspirin EC 81 MG tablet TAKE ONE TABLET BY MOUTH ONCE DAILY 30 tablet 3  . atorvastatin (LIPITOR) 20 MG tablet Take 20 mg by mouth daily.    Marland Kitchen azelastine (OPTIVAR) 0.05 % ophthalmic solution INSTILL 1 DROP INTO BOTH EYES TWICE A DAY  0  . b complex vitamins tablet Take 1 tablet by mouth daily.    . budesonide-formoterol (SYMBICORT) 80-4.5 MCG/ACT inhaler Inhale 2 puffs into the lungs 2 (two) times daily as needed.    . carvedilol (COREG) 6.25 MG tablet Take 1 tablet (6.25 mg total) by mouth 2 (two) times daily with a meal. TAKE 2 TABS BY MOUTH IN THE AM & 1 TABS BY MOUTH IN THE PM 60 tablet 6  . celecoxib (CELEBREX) 200 MG capsule TAKE 1 CAPSULE BY MOUTH 1 - 2 TIMES DAILY FOR ARTHRITIC HIP PAIN  0  . diclofenac sodium (VOLTAREN) 1 % GEL     . ENTRESTO 49-51  MG TAKE 1 TABLET BY MOUTH TWICE A DAY 60 tablet 3  . furosemide (LASIX) 20 MG tablet Take 20 mg by mouth 2 (two) times daily.    Marland Kitchen gabapentin (NEURONTIN) 300 MG capsule Take 300 mg by mouth 2 (two) times daily.    Marland Kitchen ibuprofen (ADVIL,MOTRIN) 800 MG tablet TAKE 1 TABLET BY MOUTH 3 TIMES A DAY AS NEEDED FOR HIP/ANKLE PAIN  5  . lubiprostone (AMITIZA) 24 MCG capsule Take 24 mcg by mouth 2 (two) times daily with a meal.     . Olopatadine HCl (PATADAY) 0.2 % SOLN Place 1 drop into both eyes 2 (two) times daily.    Marland Kitchen omeprazole (PRILOSEC) 40 MG capsule TAKE 1 CAPSULE BY MOUTH ONCE DAILY FOR GERD/HEARTBURN  8  . potassium chloride SA (K-DUR,KLOR-CON) 20 MEQ tablet Take 1 tablet (20  mEq total) by mouth daily. 30 tablet 0  . spironolactone (ALDACTONE) 25 MG tablet Take 1 tablet (25 mg total) by mouth daily. 90 tablet 3  . tiotropium (SPIRIVA) 18 MCG inhalation capsule Place 18 mcg into inhaler and inhale daily.    . traMADol (ULTRAM) 50 MG tablet Take 50 mg by mouth every 6 (six) hours as needed for moderate pain.    Marland Kitchen colchicine (COLCRYS) 0.6 MG tablet Take 0.6 mg by mouth daily as needed (gout).     No current facility-administered medications for this encounter.     Vitals:   05/27/18 1034  BP: 110/76  Pulse: 75  SpO2: 98%  Weight: 99.7 kg (219 lb 12.8 oz)    Wt Readings from Last 3 Encounters:  05/27/18 99.7 kg (219 lb 12.8 oz)  05/05/18 102.2 kg (225 lb 6.4 oz)  08/31/17 96.2 kg (212 lb)    PHYSICAL EXAM: General: NAD Neck: No JVD, no thyromegaly or thyroid nodule.  Lungs: Distant BS CV: Nondisplaced PMI.  Heart regular S1/S2, no S3/S4, no murmur.  No peripheral edema.  No carotid bruit.  Unableto palpate pedal pulses.  Abdomen: Soft, nontender, no hepatosplenomegaly, no distention.  Skin: Intact without lesions or rashes.  Neurologic: Alert and oriented x 3.  Psych: Normal affect. Extremities: No clubbing or cyanosis.  HEENT: Normal.   ASSESSMENT & PLAN:  1. CAD s/p CABG: Had cath in 10/16 with patent grafts, medical management.  Chest pain prior to last appointment, no CP since.  Cardiolite was ordered but never done.   - Given possible need for right THR, think Cardiolite for risk stratification is reasonable. I will re-order.  - Continue ASA 81 and statin. Check lipids today.  2. Chronic systolic HF: Ischemic cardiomyopathy, St Jude ICD. Echo 7/17 30-35%.  NYHA class II symptoms, he is not volume overloaded on exam.  - Increase Coreg to 6.25 mg bid.  - Continue Entresto to 49/51  - Continue spironolactone 25 mg daily.  - I wll arrange for repeat echo.  3. COPD/smoking: I strongly encouraged him to quit.  He is willing to try nicotine patches.   4. PAD: Stable claudication.  - I encouraged him to walk at least a short distance through the pain.  - Needs to quit smoking.  - I will repeat peripheral arterial dopplers.  - Continue ASA and statin.  5. R hip pain: Possible need for hip replacement.  As above, arranging for Cardiolite.  If there is not significant ischemia, he should be at reasonable risk to proceed with THR if needed.     Followup with HF pharmacist in 3 wks for med titration,  see me in 6 wks.   Loralie Champagne 05/29/2018

## 2018-05-30 ENCOUNTER — Other Ambulatory Visit (HOSPITAL_COMMUNITY): Payer: Self-pay | Admitting: Cardiology

## 2018-05-30 DIAGNOSIS — I739 Peripheral vascular disease, unspecified: Secondary | ICD-10-CM

## 2018-06-02 ENCOUNTER — Other Ambulatory Visit (HOSPITAL_COMMUNITY): Payer: Self-pay

## 2018-06-02 ENCOUNTER — Telehealth (HOSPITAL_COMMUNITY): Payer: Self-pay | Admitting: *Deleted

## 2018-06-02 NOTE — Telephone Encounter (Signed)
Patient given detailed instructions per Myocardial Perfusion Study Information Sheet for the test on 06/08/18 at 10:45. Patient notified to arrive 15 minutes early and that it is imperative to arrive on time for appointment to keep from having the test rescheduled.  If you need to cancel or reschedule your appointment, please call the office within 24 hours of your appointment. . Patient verbalized understanding.[name] [name]

## 2018-06-03 ENCOUNTER — Inpatient Hospital Stay (HOSPITAL_COMMUNITY): Admission: RE | Admit: 2018-06-03 | Payer: Medicare Other | Source: Ambulatory Visit

## 2018-06-03 ENCOUNTER — Encounter: Payer: Self-pay | Admitting: Sports Medicine

## 2018-06-03 ENCOUNTER — Ambulatory Visit: Payer: Medicare Other | Admitting: Sports Medicine

## 2018-06-03 VITALS — BP 94/58 | HR 78 | Resp 16

## 2018-06-03 DIAGNOSIS — I739 Peripheral vascular disease, unspecified: Secondary | ICD-10-CM

## 2018-06-03 DIAGNOSIS — B351 Tinea unguium: Secondary | ICD-10-CM

## 2018-06-03 DIAGNOSIS — M79676 Pain in unspecified toe(s): Secondary | ICD-10-CM

## 2018-06-03 DIAGNOSIS — L853 Xerosis cutis: Secondary | ICD-10-CM

## 2018-06-03 DIAGNOSIS — E1140 Type 2 diabetes mellitus with diabetic neuropathy, unspecified: Secondary | ICD-10-CM

## 2018-06-03 NOTE — Progress Notes (Signed)
Patient ID: [name] [name], male   DOB: 05-24-1951, 67 y.o.   MRN: [id number]  Subjective: [name] [name] is a 67 y.o. male patient with history of type 2 diabetes who returns to office today complaining of long, painful nails  while ambulating in shoes, unable to trim. Patient states that the glucose reading today was not recorded but yesterday was 116. Patient denies any new changes in medication or new problems. Last PCP with Dr. Tobie Lords was 1 month ago.  Patient Active Problem List   Diagnosis Date Noted  . Chest pain 04/08/2015  . Abnormal nuclear stress test 04/08/2015  . Ischemic cardiomyopathy 07/06/2014  . Lumbago 03/13/2014  . Special screening for malignant neoplasms, colon 02/09/2014  . Normocytic anemia 02/09/2014  . Pleural effusion on left 11/28/2013  . Atherosclerosis of native arteries of extremity with intermittent claudication ([place]) 11/07/2013  . Weakness of both legs 11/07/2013  . Smoker 09/19/2013  . Numbness in right leg/ Foot 06/14/2013  . Pain in limb-Right Leg/foot 06/14/2013  . Atherosclerosis of native arteries of the extremities with ulceration(440.23) 06/14/2013  . Chronic systolic heart failure ([place]) 05/29/2013  . Atrial fibrillation ([place]) 05/29/2013  . Chest pain, mid sternal 05/19/2013  . S/P CABG x 5 05/08/2013  . Right foot ulcer ([place]) 05/04/2013  . Coronary atherosclerosis of native coronary artery 04/28/2013  . Acute systolic heart failure ([place]) 04/28/2013  . Abnormal blood chemistry 12/30/2012  . Abnormal LFTs 12/30/2012  . Aspiration pneumonia ([place]) 12/30/2012  . Chronic congestive heart failure ([place]) 12/27/2012  . Leg weakness 12/26/2012  . Pneumonia 05/18/2011  . Gout 05/17/2011  . Respiratory distress, acute 05/17/2011  . Hypertension 05/17/2011  . COPD (chronic obstructive pulmonary disease) ([place]) 05/17/2011  . CHF, acute ([place]) 05/17/2011   Current Outpatient Medications on File Prior to Visit  Medication Sig Dispense Refill  . albuterol  (PROVENTIL HFA;VENTOLIN HFA) 108 (90 BASE) MCG/ACT inhaler Inhale 1 puff into the lungs every 6 (six) hours as needed for wheezing or shortness of breath.    . allopurinol (ZYLOPRIM) 300 MG tablet Take 300 mg by mouth daily.    Marland Kitchen aspirin EC 81 MG tablet TAKE ONE TABLET BY MOUTH ONCE DAILY 30 tablet 3  . atorvastatin (LIPITOR) 20 MG tablet Take 20 mg by mouth daily.    Marland Kitchen azelastine (OPTIVAR) 0.05 % ophthalmic solution INSTILL 1 DROP INTO BOTH EYES TWICE A DAY  0  . b complex vitamins tablet Take 1 tablet by mouth daily.    . budesonide-formoterol (SYMBICORT) 80-4.5 MCG/ACT inhaler Inhale 2 puffs into the lungs 2 (two) times daily as needed.    . carvedilol (COREG) 6.25 MG tablet Take 1 tablet (6.25 mg total) by mouth 2 (two) times daily with a meal. TAKE 2 TABS BY MOUTH IN THE AM & 1 TABS BY MOUTH IN THE PM 60 tablet 6  . celecoxib (CELEBREX) 200 MG capsule TAKE 1 CAPSULE BY MOUTH 1 - 2 TIMES DAILY FOR ARTHRITIC HIP PAIN  0  . colchicine (COLCRYS) 0.6 MG tablet Take 0.6 mg by mouth daily as needed (gout).    Marland Kitchen diclofenac sodium (VOLTAREN) 1 % GEL     . ENTRESTO 49-51 MG TAKE 1 TABLET BY MOUTH TWICE A DAY 60 tablet 3  . furosemide (LASIX) 20 MG tablet Take 20 mg by mouth 2 (two) times daily.    Marland Kitchen gabapentin (NEURONTIN) 300 MG capsule Take 300 mg by mouth 2 (two) times daily.    Marland Kitchen ibuprofen (ADVIL,MOTRIN) 800 MG tablet  TAKE 1 TABLET BY MOUTH 3 TIMES A DAY AS NEEDED FOR HIP/ANKLE PAIN  5  . lubiprostone (AMITIZA) 24 MCG capsule Take 24 mcg by mouth 2 (two) times daily with a meal.     . Olopatadine HCl (PATADAY) 0.2 % SOLN Place 1 drop into both eyes 2 (two) times daily.    Marland Kitchen omeprazole (PRILOSEC) 40 MG capsule TAKE 1 CAPSULE BY MOUTH ONCE DAILY FOR GERD/HEARTBURN  8  . potassium chloride SA (K-DUR,KLOR-CON) 20 MEQ tablet Take 1 tablet (20 mEq total) by mouth daily. 30 tablet 0  . spironolactone (ALDACTONE) 25 MG tablet Take 1 tablet (25 mg total) by mouth daily. 90 tablet 3  . tiotropium (SPIRIVA)  18 MCG inhalation capsule Place 18 mcg into inhaler and inhale daily.    . traMADol (ULTRAM) 50 MG tablet Take 50 mg by mouth every 6 (six) hours as needed for moderate pain.     No current facility-administered medications on file prior to visit.    Allergies  Allergen Reactions  . Penicillins Other (See Comments) and Nausea And Vomiting    Per Dr Audria Nine Via phone 12/27/12 0400 Swelling , long time ago. Doesn't remember   Objective: General: Patient is awake, alert, and oriented x 3 and in no acute distress.  Integument: Skin is warm, dry and supple bilateral. Continued Moderate plantar>dorsal xerosis bilateral, slowly improving. Nails are tender, long, thickened and dystrophic with subungual debris, consistent with onychomycosis, 1-5 bilateral. No signs of infection. No open lesions or preulcerative lesions present bilateral. Minimal callus to 1st toesRemaining integument unremarkable.  Vasculature:  Dorsalis Pedis pulse 1/4 bilateral. Posterior Tibial pulse  0/4 bilateral.  Capillary fill time <3 sec 1-5 bilateral. Scant hair growth to the level of the digits. Temperature gradient within normal limits. No varicosities present bilateral. Mild trace edema present bilateral ankles.   Neurology: The patient has diminished sensation measured with a 5.07/10g Semmes Weinstein Monofilament at all pedal sites bilateral . Vibratory sensation diminished bilateral with tuning fork. No Babinski sign present bilateral.   Musculoskeletal: Asymptomatioc pes planus and bunion pedal deformities noted bilateral. Muscular strength 5/5 in all lower extremity muscular groups bilateral without pain or limitation on range of motion. No tenderness with calf compression bilateral.  Assessment and Plan: Problem List Items Addressed This Visit      Other   Pain in limb-Right Leg/foot    Other Visit Diagnoses    Dermatophytosis of nail    -  Primary   Type 2 diabetes, controlled, with neuropathy ([place])        Peripheral vascular disease ([place])       Xerosis of skin         -Examined patient. -Discussed and educated patient on diabetic foot care, especially with regards to the vascular, neurological and musculoskeletal systems.  -Mechanically debrided all nails 1-5 bilateral using sterile nail nipper and filed with dremel without incident  -Advised daily skin emollients for dry skin -Continue with diabetic shoes -Patient to return in 3 months for at risk foot care  -Patient advised to call the office if any problems or questions arise in the meantime.  Landis Martins, DPM

## 2018-06-07 ENCOUNTER — Ambulatory Visit (HOSPITAL_COMMUNITY)
Admission: RE | Admit: 2018-06-07 | Discharge: 2018-06-07 | Disposition: A | Discharge status: Home / Self Care | Payer: Medicare Other | Source: Ambulatory Visit | Attending: Cardiology | Admitting: Cardiology

## 2018-06-07 DIAGNOSIS — I739 Peripheral vascular disease, unspecified: Secondary | ICD-10-CM

## 2018-06-08 ENCOUNTER — Other Ambulatory Visit: Payer: Self-pay

## 2018-06-08 ENCOUNTER — Ambulatory Visit (HOSPITAL_BASED_OUTPATIENT_CLINIC_OR_DEPARTMENT_OTHER): Payer: Medicare Other

## 2018-06-08 ENCOUNTER — Ambulatory Visit (HOSPITAL_COMMUNITY): Payer: Medicare Other | Attending: Cardiovascular Disease

## 2018-06-08 DIAGNOSIS — I5022 Chronic systolic (congestive) heart failure: Secondary | ICD-10-CM | POA: Insufficient documentation

## 2018-06-08 DIAGNOSIS — R079 Chest pain, unspecified: Secondary | ICD-10-CM | POA: Diagnosis not present

## 2018-06-08 MED ORDER — TECHNETIUM TC 99M TETROFOSMIN IV KIT
29.4000 | PACK | Freq: Once | INTRAVENOUS | Status: AC | PRN
  Administered 2018-06-08: 29.4 via INTRAVENOUS
  Filled 2018-06-08: qty 30

## 2018-06-09 ENCOUNTER — Ambulatory Visit (HOSPITAL_COMMUNITY): Payer: Medicare Other | Attending: Cardiology

## 2018-06-09 DIAGNOSIS — R079 Chest pain, unspecified: Secondary | ICD-10-CM | POA: Insufficient documentation

## 2018-06-09 LAB — MYOCARDIAL PERFUSION IMAGING
LV dias vol: 162 mL (ref 62–150)
LV sys vol: 115 mL
Peak HR: 87 {beats}/min
Rest HR: 72 {beats}/min
SDS: 3
SRS: 1
SSS: 4
TID: 1.01

## 2018-06-09 MED ORDER — TECHNETIUM TC 99M TETROFOSMIN IV KIT
31.4000 | PACK | Freq: Once | INTRAVENOUS | Status: AC | PRN
  Administered 2018-06-09: 31.4 via INTRAVENOUS
  Filled 2018-06-09: qty 32

## 2018-06-09 MED ORDER — REGADENOSON 0.4 MG/5ML IV SOLN
0.4000 mg | Freq: Once | INTRAVENOUS | Status: AC
  Administered 2018-06-09: 0.4 mg via INTRAVENOUS

## 2018-06-10 ENCOUNTER — Other Ambulatory Visit: Payer: Self-pay | Admitting: Internal Medicine

## 2018-06-10 ENCOUNTER — Encounter: Payer: Self-pay | Admitting: Cardiology

## 2018-06-21 ENCOUNTER — Encounter (HOSPITAL_COMMUNITY): Payer: Self-pay

## 2018-06-21 ENCOUNTER — Telehealth (HOSPITAL_COMMUNITY): Payer: Self-pay

## 2018-06-21 NOTE — Telephone Encounter (Signed)
Pt called unable to leave a voice mail, mail box full

## 2018-06-23 ENCOUNTER — Ambulatory Visit (INDEPENDENT_AMBULATORY_CARE_PROVIDER_SITE_OTHER): Payer: Medicare HMO

## 2018-06-23 DIAGNOSIS — I255 Ischemic cardiomyopathy: Secondary | ICD-10-CM

## 2018-06-24 NOTE — Progress Notes (Signed)
Remote ICD transmission.   

## 2018-06-26 LAB — CUP PACEART REMOTE DEVICE CHECK
Battery Remaining Longevity: 66 mo
Battery Remaining Percentage: 65 %
Battery Voltage: 2.93 V
Brady Statistic RV Percent Paced: 1 %
Date Time Interrogation Session: 20200102183035
HighPow Impedance: 66 Ohm
HighPow Impedance: 66 Ohm
Implantable Lead Implant Date: 20160115
Implantable Lead Location: 753860
Implantable Lead Model: 7122
Implantable Pulse Generator Implant Date: 20160115
Lead Channel Pacing Threshold Amplitude: 1 V
Lead Channel Pacing Threshold Pulse Width: 0.5 ms
Lead Channel Sensing Intrinsic Amplitude: 4.9 mV
Lead Channel Setting Pacing Amplitude: 2.5 V
Lead Channel Setting Pacing Pulse Width: 0.5 ms
Lead Channel Setting Sensing Sensitivity: 0.5 mV
MDC IDC MSMT LEADCHNL RV IMPEDANCE VALUE: 380 Ohm
Pulse Gen Serial Number: 7233603

## 2018-07-03 NOTE — Progress Notes (Signed)
PCP: [name] [facility]) Cardiac Surgeon: Dr Roxan Hockey Pulmonologist: Dr. Lamonte Sakai Cardiology: Dr. Aundra Dubin  HPI: [name] [name] is a 68 yo with history of COPD, HTN, 3V CAD s/p CABG x 5  (CABG 04/2013: LIMA to LAD, SVG to second diagonal, SVG to ramus intermediate and obtuse marginal 2, SVG to posterior descending), ischemic cardiomyopathy, chronic systolic HF, COPD, DM2, PVD and tobacco abuse. Recently underwent ABI and stress test. ABI 06/07/18: unchanged from prior study, Right: Resting right and left ABI indicate moderate lower extremity arterial disease. The left and right toe-brachial index is abnormal. Stress test 06/09/18: EF 29%, diffuse hypokinesis, no ischemia  He presents today for pharmacist-managed medication titration. He was last seen in HF clinic by Dr. Aundra Dubin on 05/27/18 where his carvedilol was increased to 6.25 mg BID. He has not had any further chest pain.  He is short of breath walking up stairs but does ok on flat ground.  He walks about a mile for exercise daily. His reports his calves will hurt sometimes bilaterally after walking 1/4-1/2 miles. Denies dizziness. Weight is up 5 lbs.   Discussed smoking cessation with patient, will be very hard moving forward. He has attempted to quit a handful of times and would last about 2 days. Unfortunately, his girlfriend who lives with him is also a smoker. He said on a handful of occasions he would forget he was wearing his patch, and took a few drags off his girlfriends cigarette; he is aware this is unsafe while wearing the patch. His girlfriend is wheelchair bound and would not be able to start only smoking outside.     . Shortness of breath/dyspnea on exertion? No, walks a mile a day . Orthopnea/PND? yes . Edema? no . Lightheadedness/dizziness? no . Daily weights at home? Yes, weighs self 5 times a week . Blood pressure/heart rate monitoring at home? Yes, SBP averaging 110 mmHg. "It's been a while since I've seen it in the  90s" . Following low-sodium/fluid-restricted diet? Yes, tries to not add salt to foods  HF Medications: Carvedilol 6.25 mg twice daily Spironolactone 25 mg daily ----- Entresto 49/51 mg twice daily- patient was unable to find pill bottle and was not in pill box, likely has run out Reports he has not been taking furosemide in the last month but is uncertain, previously 20 mg twice daily **Patient has nurse fill up pill box at home**  Has the patient been experiencing any side effects to the medications prescribed?  no Does the patient have any problems obtaining medications due to transportation or finances?  No- home health nurse helps pick it up.   Understanding of regimen: good Understanding of indications: good Potential of compliance: good Patient understands to avoid NSAIDs. Patient understands to avoid decongestants.    Pertinent Lab Values: . 05/27/18: Scr 2.07, BUN 33,  K 4.3, Na 139 . 05/05/18: SCr 2.05, BUN 35, K 4.6, Na 139 . 08/31/17: Scr 1.78, BUN 33, K 4.4, Na 141  Vital Signs: . Weight: 225 lb (dry weight: 220 lb) . Blood pressure: 135/80 mmHg  . Heart rate: 73 bpm  Assessment: 1. Chronic systolic CHF (EF 97%, 35/3299), due to ischemic cardiomyopathy. NYHA class II symptoms. - Volume neutral - Patient confused on medications. Called patient at home with bottles in hand, he was able to find empty carvedilol 6.25 mg twice daily and spironolactone 25 mg daily. Patient was unable to find Entresto bottle and Delene Loll was not identified to be in pill box - Continue carvedilol  6.25 mg twice daily and spironolactone 25 mg daily - Restart Entresto 49/51 mg twice daily. BMET next week to confirm safety - Basic disease state pathophysiology, medication indication, mechanism and side effects reviewed at length with patient and he verbalized understanding  2. CAD s/p CABG: Had cath in 10/16 with patent grafts, medical management.  Chest pain prior to last appointment, no CP  since. Stress test (05/2018) revealed no ischemia    - Continue ASA 81 and statin. 05/27/18: Tot 109, TG 101, HDL 32, LDL 57   3. COPD/smoking: - Discussed quitting smoking with patient. He has tried and quit for a few days, but is limited by girlfriend who is still currently smoking. I strongly encouraged him to quit and discuss quitting with his girlfriend. He is discouraged by his current situation, and wishes she were more motivated to quit  4. PAD: Stable claudication.  - I encouraged him to continue walking through the pain.  - Needs to quit smoking.  - Repeat dopplers revealed moderate disease - Continue ASA and statin.   5. R hip pain: Possible need for hip replacement.  Patient using ibuprofen regularly for pain. Emphasized the potential harms of ibuprofen given current kidney function, and encouraged patient to decrease ibuprofen intake and alternate with acetaminophen.   Plan: 1) Medication changes: Based on clinical presentation, vital signs and recent labs will restart Entresto 49/51 mg twice daily 2) Labs: one week- 07/13/2018 3) Follow-up: Dr. Aundra Dubin 07/13/2018  Claiborne Billings, PharmD PGY2 Cardiology Pharmacy Resident 07/04/2018 6:09 PM

## 2018-07-04 ENCOUNTER — Ambulatory Visit (HOSPITAL_COMMUNITY)
Admission: RE | Admit: 2018-07-04 | Discharge: 2018-07-04 | Disposition: A | Discharge status: Home / Self Care | Payer: Medicare HMO | Source: Ambulatory Visit | Attending: Internal Medicine | Admitting: Internal Medicine

## 2018-07-04 VITALS — BP 135/80 | HR 73 | Wt 225.4 lb

## 2018-07-04 DIAGNOSIS — I255 Ischemic cardiomyopathy: Secondary | ICD-10-CM

## 2018-07-04 DIAGNOSIS — I5022 Chronic systolic (congestive) heart failure: Secondary | ICD-10-CM | POA: Insufficient documentation

## 2018-07-04 MED ORDER — SACUBITRIL-VALSARTAN 49-51 MG PO TABS: 1.0000 | ORAL_TABLET | Freq: Two times a day (BID) | ORAL | 3 refills | Status: DC

## 2018-07-04 NOTE — Patient Instructions (Signed)
It was nice to meet you today.  Continue taking your spironolactone 25 mg daily and carvedilol 6.25 mg twice daily.   I will call you to confirm that you have been taking Entresto 9-51 mg twice daily.  Please try to LIMIT your intake of ibuprofen, as it can be harmful to your kidneys and your stomach. Try to alternate with tylenol to assist with the pain in the interim.   Start monitoring your blood pressure every day.

## 2018-07-13 ENCOUNTER — Other Ambulatory Visit (HOSPITAL_COMMUNITY): Payer: Self-pay

## 2018-07-13 ENCOUNTER — Encounter (HOSPITAL_COMMUNITY): Payer: Self-pay | Admitting: Cardiology

## 2018-07-13 ENCOUNTER — Ambulatory Visit (HOSPITAL_COMMUNITY)
Admission: RE | Admit: 2018-07-13 | Discharge: 2018-07-13 | Disposition: A | Discharge status: Home / Self Care | Payer: Medicare HMO | Source: Ambulatory Visit | Attending: Cardiology | Admitting: Cardiology

## 2018-07-13 VITALS — BP 110/68 | HR 94 | Wt 220.6 lb

## 2018-07-13 DIAGNOSIS — M25551 Pain in right hip: Secondary | ICD-10-CM | POA: Diagnosis not present

## 2018-07-13 DIAGNOSIS — E785 Hyperlipidemia, unspecified: Secondary | ICD-10-CM | POA: Insufficient documentation

## 2018-07-13 DIAGNOSIS — Z7982 Long term (current) use of aspirin: Secondary | ICD-10-CM | POA: Diagnosis not present

## 2018-07-13 DIAGNOSIS — M109 Gout, unspecified: Secondary | ICD-10-CM | POA: Diagnosis not present

## 2018-07-13 DIAGNOSIS — E1122 Type 2 diabetes mellitus with diabetic chronic kidney disease: Secondary | ICD-10-CM | POA: Insufficient documentation

## 2018-07-13 DIAGNOSIS — E1151 Type 2 diabetes mellitus with diabetic peripheral angiopathy without gangrene: Secondary | ICD-10-CM | POA: Insufficient documentation

## 2018-07-13 DIAGNOSIS — Z951 Presence of aortocoronary bypass graft: Secondary | ICD-10-CM | POA: Insufficient documentation

## 2018-07-13 DIAGNOSIS — J449 Chronic obstructive pulmonary disease, unspecified: Secondary | ICD-10-CM | POA: Insufficient documentation

## 2018-07-13 DIAGNOSIS — I5022 Chronic systolic (congestive) heart failure: Secondary | ICD-10-CM | POA: Insufficient documentation

## 2018-07-13 DIAGNOSIS — I739 Peripheral vascular disease, unspecified: Secondary | ICD-10-CM

## 2018-07-13 DIAGNOSIS — I2510 Atherosclerotic heart disease of native coronary artery without angina pectoris: Secondary | ICD-10-CM | POA: Diagnosis not present

## 2018-07-13 DIAGNOSIS — I255 Ischemic cardiomyopathy: Secondary | ICD-10-CM | POA: Insufficient documentation

## 2018-07-13 DIAGNOSIS — Z8249 Family history of ischemic heart disease and other diseases of the circulatory system: Secondary | ICD-10-CM | POA: Diagnosis not present

## 2018-07-13 DIAGNOSIS — I130 Hypertensive heart and chronic kidney disease with heart failure and stage 1 through stage 4 chronic kidney disease, or unspecified chronic kidney disease: Secondary | ICD-10-CM | POA: Insufficient documentation

## 2018-07-13 DIAGNOSIS — N183 Chronic kidney disease, stage 3 (moderate): Secondary | ICD-10-CM | POA: Diagnosis not present

## 2018-07-13 DIAGNOSIS — Z833 Family history of diabetes mellitus: Secondary | ICD-10-CM | POA: Diagnosis not present

## 2018-07-13 DIAGNOSIS — M1990 Unspecified osteoarthritis, unspecified site: Secondary | ICD-10-CM | POA: Diagnosis not present

## 2018-07-13 DIAGNOSIS — Z79899 Other long term (current) drug therapy: Secondary | ICD-10-CM | POA: Diagnosis not present

## 2018-07-13 DIAGNOSIS — F17210 Nicotine dependence, cigarettes, uncomplicated: Secondary | ICD-10-CM | POA: Diagnosis not present

## 2018-07-13 LAB — BASIC METABOLIC PANEL
Anion gap: 9 (ref 5–15)
BUN: 22 mg/dL (ref 8–23)
CALCIUM: 9.5 mg/dL (ref 8.9–10.3)
CO2: 24 mmol/L (ref 22–32)
Chloride: 107 mmol/L (ref 98–111)
Creatinine, Ser: 1.88 mg/dL — ABNORMAL HIGH (ref 0.61–1.24)
GFR calc Af Amer: 42 mL/min — ABNORMAL LOW (ref >60)
GFR calc non Af Amer: 36 mL/min — ABNORMAL LOW (ref >60)
Glucose, Bld: 96 mg/dL (ref 70–99)
Potassium: 4 mmol/L (ref 3.5–5.1)
Sodium: 140 mmol/L (ref 135–145)

## 2018-07-13 MED ORDER — CARVEDILOL 6.25 MG PO TABS: 9.3750 mg | ORAL_TABLET | Freq: Two times a day (BID) | ORAL | 6 refills | Status: DC

## 2018-07-13 NOTE — Patient Instructions (Signed)
Labs done today. We will contact you for any abnormal labs.  INCREASE Carvedilol 9.375mg  (1.5 tabs) twice daily.  In order for [place] to clear you for you surgery you need to quit smoking.  Follow up with the Advanced Practice Providers in 2 months.

## 2018-07-13 NOTE — Progress Notes (Signed)
Patient ID: [name] [name], male   DOB: [birth year], 68 y.o.   MRN: [id number] PCP: [name] [name]) Cardiac Surgeon: Dr [name] [name] Pulmonologist: Dr. Lamonte [name] Cardiology: Dr. Aundra [name]  HPI: [name] is a 68 yo with history of COPD, HTN, 3V CAD s/p CABG x 5  (CABG 04/2013: LIMA to LAD, SVG to second diagonal, SVG to ramus intermediate and obtuse marginal 2, SVG to posterior descending), ischemic cardiomyopathy, chronic systolic HF, COPD, DM2, PVD and tobacco abuse.  Lexiscan Cardiolite in 12/19 showed inferior infarct, no ischemia.  Echo in 12/19 showed EF 35%, mild LVH, mildly decreased RV systolic function.  ABIs in 12/19 were stable compared to the past.    He returns for followup of CHF and CAD. Still smoking 1/2 ppd.  He continues to have significant right hip pain and says that R THR was recommended.  He can climb up a flight of stairs but moves slowly due to hip pain.  No dyspnea walking on flat ground up to a mile.  He gets pain in his calves with moderate exertion but walks through it. No chest pain.   Labs (11/19): K 4.6, creatinine 2.05 Labs(12/19): K 4.3, creatinine 2.07, LDL 57    PMH: 1. Chronic systolic CHF: Ischemic cardiomyopathy.  St Jude ICD.  - ECHO 06/26/13 EF 20-25% RV mild HK - ECHO 10/10/13 EF 20-25% restrictive filling pattern. Moderate RV dysfunction. Mild MR. - Cardiolite (10/16) with EF 19%, anterior ischemia.  - Echo 7/17 EF 30-35% - Echo (12/19): EF 35%, mild LVH, basal-mid inferior akinesis, mildly decreased RV systolic function, mild MR.  2. CAD: CABG x 5 11/14 with LIMA-LAD, SVG-D2, seq SVG-ramus/OM2, SVG-PDA.  - LHC (10/16) with patent LIMA-LAD and 60% pLAD stenosis (competitive flow). SVG-D, sequential SVG-ramus/OM2, SVG-PDA all patent. Medical management.  - Lexiscan Cardiolite in 12/19 showed inferior infarction, no ischemia.  3. COPD: Active smoker.  4. PAD:  - ABIs 11/07/13: RABI .63 moderate arterial occlusive dz,  LABI .78 moderate arterial occlusive  dz - ABIs 9/16: RABI .68, LABI 0.75 - ABIs 12/19: right ABI 0.75, left ABI 0.7 5. Type 2 diabetes 6. Hyperlipidemia 7. Osteoarthritis 8. Gout  9. Hyperlipidemia 10.  CKD: Stage 3.   SH: Lives with his girlfriend.  Smokes 1/2 ppd. Does not drink alcohol.   FH: 2 brothers died from heart attacks, Mother DM, Dad HTN   ROS: All systems negative except as listed in HPI, PMH and Problem List.  Current Outpatient Medications  Medication Sig Dispense Refill  . albuterol (PROVENTIL HFA;VENTOLIN HFA) 108 (90 BASE) MCG/ACT inhaler Inhale 1 puff into the lungs every 6 (six) hours as needed for wheezing or shortness of breath.    . allopurinol (ZYLOPRIM) 300 MG tablet Take 300 mg by mouth daily.    Marland Kitchen aspirin EC 81 MG tablet TAKE ONE TABLET BY MOUTH ONCE DAILY 30 tablet 3  . atorvastatin (LIPITOR) 20 MG tablet Take 20 mg by mouth daily.    Marland Kitchen azelastine (OPTIVAR) 0.05 % ophthalmic solution INSTILL 1 DROP INTO BOTH EYES TWICE A DAY  0  . b complex vitamins tablet Take 1 tablet by mouth daily.    . budesonide-formoterol (SYMBICORT) 80-4.5 MCG/ACT inhaler Inhale 2 puffs into the lungs 2 (two) times daily as needed.    . carvedilol (COREG) 6.25 MG tablet Take 1.5 tablets (9.375 mg total) by mouth 2 (two) times daily with a meal. 60 tablet 6  . celecoxib (CELEBREX) 200 MG capsule TAKE 1 CAPSULE BY MOUTH 1 -  2 TIMES DAILY FOR ARTHRITIC HIP PAIN  0  . colchicine (COLCRYS) 0.6 MG tablet Take 0.6 mg by mouth daily as needed (gout).    Marland Kitchen diclofenac sodium (VOLTAREN) 1 % GEL     . furosemide (LASIX) 20 MG tablet Take 40 mg by mouth 2 (two) times daily.     Marland Kitchen gabapentin (NEURONTIN) 300 MG capsule Take 300 mg by mouth 2 (two) times daily.    Marland Kitchen ibuprofen (ADVIL,MOTRIN) 800 MG tablet TAKE 1 TABLET BY MOUTH 3 TIMES A DAY AS NEEDED FOR HIP/ANKLE PAIN  5  . lubiprostone (AMITIZA) 24 MCG capsule Take 24 mcg by mouth 2 (two) times daily with a meal.     . Olopatadine HCl (PATADAY) 0.2 % SOLN Place 1 drop into both  eyes 2 (two) times daily.    Marland Kitchen omeprazole (PRILOSEC) 40 MG capsule 20 mg.   8  . potassium chloride SA (K-DUR,KLOR-CON) 20 MEQ tablet Take 1 tablet (20 mEq total) by mouth daily. 30 tablet 0  . sacubitril-valsartan (ENTRESTO) 49-51 MG Take 1 tablet by mouth 2 (two) times daily. 60 tablet 3  . spironolactone (ALDACTONE) 25 MG tablet Take 1 tablet (25 mg total) by mouth daily. 90 tablet 3  . tiotropium (SPIRIVA) 18 MCG inhalation capsule Place 18 mcg into inhaler and inhale daily.    . traMADol (ULTRAM) 50 MG tablet Take 50 mg by mouth every 6 (six) hours as needed for moderate pain.     No current facility-administered medications for this encounter.     Vitals:   07/13/18 1450  BP: 110/68  Pulse: 94  SpO2: 98%  Weight: 100.1 kg (220 lb 9.6 oz)    Wt Readings from Last 3 Encounters:  07/13/18 100.1 kg (220 lb 9.6 oz)  07/04/18 102.2 kg (225 lb 6.4 oz)  06/08/18 99.7 kg (219 lb 12.8 oz)    PHYSICAL EXAM: General: NAD Neck: No JVD, no thyromegaly or thyroid nodule.  Lungs: Clear to auscultation bilaterally with normal respiratory effort. CV: Nondisplaced PMI.  Heart regular S1/S2, no S3/S4, no murmur.  No peripheral edema.  No carotid bruit.  Unable to palpate pedal pulses.  Abdomen: Soft, nontender, no hepatosplenomegaly, no distention.  Skin: Intact without lesions or rashes.  Neurologic: Alert and oriented x 3.  Psych: Normal affect. Extremities: No clubbing or cyanosis.  HEENT: Normal.   ASSESSMENT & PLAN:  1. CAD s/p CABG: Had cath in 10/16 with patent grafts, medical management.  Cardiolite in 12/19 showed no ischemia (just prior infarction).  - Continue ASA 81 and statin. Good lipids in 12/19.  2. Chronic systolic HF: Ischemic cardiomyopathy, St Jude ICD. Echo 12/19 with EF 35%.  NYHA class II symptoms, he is not volume overloaded on exam.  - Increase Coreg to 9.375 mg bid.   - Continue Entresto to 49/51  - Continue spironolactone 25 mg daily.  - BMET today.  3.  COPD/smoking: I strongly encouraged him to quit.  He is willing to try nicotine patches.  4. PAD: Stable claudication and stable ABIs in 12/19. He tries to walk through the pain.  - Needs to quit smoking.  - Continue ASA and statin.  5. R hip pain: He needs right THR.  I think he is of acceptable risk to undergo procedure.  Continue beta blocker peri-op.     Followup NP/PA 2 months.   Loralie Champagne 07/13/2018

## 2018-07-18 DIAGNOSIS — M87051 Idiopathic aseptic necrosis of right femur: Secondary | ICD-10-CM | POA: Diagnosis not present

## 2018-07-21 ENCOUNTER — Telehealth (HOSPITAL_COMMUNITY): Payer: Self-pay

## 2018-07-21 NOTE — Telephone Encounter (Signed)
Spoke with pt about improved creatinine. Pt aware, agreeable and verbalized understanding.

## 2018-07-21 NOTE — Telephone Encounter (Signed)
-----   Message from Larey Dresser, MD sent at 07/13/2018  8:34 PM EST ----- Creatinine improved

## 2018-07-27 ENCOUNTER — Telehealth: Payer: Self-pay | Admitting: *Deleted

## 2018-07-27 NOTE — Telephone Encounter (Signed)
   [facility] Pre-operative Risk Assessment    Request for surgical clearance:  1. What type of surgery is being performed? RIGHT TOTAL HIP   2. When is this surgery scheduled? TBD   3. What type of clearance is required (medical clearance vs. Pharmacy clearance to hold med vs. Both)? MEDICAL  4. Are there any medications that need to be held prior to surgery and how long?ASA    5. Practice name and name of physician performing surgery? [facility]; DR. Lyla Glassing   6. What is your office phone number [phone]    7.   What is your office fax number [phone]  8.   Anesthesia type (None, local, MAC, general) ? SPINAL   Julaine Hua 07/27/2018, 12:17 PM  _________________________________________________________________   (provider comments below)

## 2018-07-29 NOTE — Telephone Encounter (Signed)
   Primary Cardiologist: Loralie Champagne, MD  Chart reviewed as part of pre-operative protocol coverage. Patient was contacted 07/29/2018 in reference to pre-operative risk assessment for pending surgery as outlined below.  Christopher Glasscock was last seen on 07/13/18 by Dr. Aundra Dubin. Dr. Aundra Dubin medically cleared him for surgery at that visit. Per his communication, patient may hold ASA for 2 days prior to procedure.   Therefore, based on ACC/AHA guidelines, the patient would be at acceptable risk for the planned procedure without further cardiovascular testing.   I will route this recommendation to the requesting party via Epic fax function and remove from pre-op pool.  Please call with questions.  Ledora Bottcher, PA 07/29/2018, 6:51 PM

## 2018-07-29 NOTE — Telephone Encounter (Signed)
Can hold aspirin if needed 2 days prior to surgery.

## 2018-08-04 ENCOUNTER — Other Ambulatory Visit (HOSPITAL_COMMUNITY): Payer: Self-pay | Admitting: Internal Medicine

## 2018-08-05 DIAGNOSIS — I509 Heart failure, unspecified: Secondary | ICD-10-CM | POA: Diagnosis not present

## 2018-08-05 DIAGNOSIS — I10 Essential (primary) hypertension: Secondary | ICD-10-CM | POA: Diagnosis not present

## 2018-08-05 DIAGNOSIS — Z9181 History of falling: Secondary | ICD-10-CM | POA: Diagnosis not present

## 2018-08-05 DIAGNOSIS — I2510 Atherosclerotic heart disease of native coronary artery without angina pectoris: Secondary | ICD-10-CM | POA: Diagnosis not present

## 2018-08-05 DIAGNOSIS — E119 Type 2 diabetes mellitus without complications: Secondary | ICD-10-CM | POA: Diagnosis not present

## 2018-08-05 DIAGNOSIS — M87051 Idiopathic aseptic necrosis of right femur: Secondary | ICD-10-CM | POA: Diagnosis not present

## 2018-08-05 DIAGNOSIS — E7800 Pure hypercholesterolemia, unspecified: Secondary | ICD-10-CM | POA: Diagnosis not present

## 2018-08-05 DIAGNOSIS — M109 Gout, unspecified: Secondary | ICD-10-CM | POA: Diagnosis not present

## 2018-08-05 DIAGNOSIS — I25119 Atherosclerotic heart disease of native coronary artery with unspecified angina pectoris: Secondary | ICD-10-CM | POA: Diagnosis not present

## 2018-08-05 DIAGNOSIS — E1140 Type 2 diabetes mellitus with diabetic neuropathy, unspecified: Secondary | ICD-10-CM | POA: Diagnosis not present

## 2018-08-09 DIAGNOSIS — R32 Unspecified urinary incontinence: Secondary | ICD-10-CM | POA: Diagnosis not present

## 2018-08-15 DIAGNOSIS — E785 Hyperlipidemia, unspecified: Secondary | ICD-10-CM | POA: Diagnosis not present

## 2018-08-15 DIAGNOSIS — E119 Type 2 diabetes mellitus without complications: Secondary | ICD-10-CM | POA: Diagnosis not present

## 2018-08-15 DIAGNOSIS — Z9181 History of falling: Secondary | ICD-10-CM | POA: Diagnosis not present

## 2018-08-15 DIAGNOSIS — Z1339 Encounter for screening examination for other mental health and behavioral disorders: Secondary | ICD-10-CM | POA: Diagnosis not present

## 2018-08-15 DIAGNOSIS — Z6836 Body mass index (BMI) 36.0-36.9, adult: Secondary | ICD-10-CM | POA: Diagnosis not present

## 2018-08-15 DIAGNOSIS — Z125 Encounter for screening for malignant neoplasm of prostate: Secondary | ICD-10-CM | POA: Diagnosis not present

## 2018-08-15 DIAGNOSIS — Z1331 Encounter for screening for depression: Secondary | ICD-10-CM | POA: Diagnosis not present

## 2018-08-15 DIAGNOSIS — Z0000 Encounter for general adult medical examination without abnormal findings: Secondary | ICD-10-CM | POA: Diagnosis not present

## 2018-08-17 ENCOUNTER — Encounter: Payer: Self-pay | Admitting: Internal Medicine

## 2018-09-02 ENCOUNTER — Ambulatory Visit (INDEPENDENT_AMBULATORY_CARE_PROVIDER_SITE_OTHER): Payer: Medicare HMO | Admitting: Sports Medicine

## 2018-09-02 ENCOUNTER — Encounter: Payer: Self-pay | Admitting: Sports Medicine

## 2018-09-02 ENCOUNTER — Other Ambulatory Visit: Payer: Self-pay

## 2018-09-02 VITALS — BP 104/68 | HR 71 | Resp 16

## 2018-09-02 DIAGNOSIS — B351 Tinea unguium: Secondary | ICD-10-CM

## 2018-09-02 DIAGNOSIS — E1140 Type 2 diabetes mellitus with diabetic neuropathy, unspecified: Secondary | ICD-10-CM

## 2018-09-02 DIAGNOSIS — M79676 Pain in unspecified toe(s): Secondary | ICD-10-CM | POA: Diagnosis not present

## 2018-09-02 DIAGNOSIS — L853 Xerosis cutis: Secondary | ICD-10-CM

## 2018-09-02 DIAGNOSIS — I739 Peripheral vascular disease, unspecified: Secondary | ICD-10-CM | POA: Diagnosis not present

## 2018-09-02 NOTE — Progress Notes (Signed)
Patient ID: [name] [name], male   DOB: June 06, 1951, 68 y.o.   MRN: [id number]  Subjective: [name] [name] is a 68 y.o. male patient with history of type 2 diabetes who returns to office today complaining of long, painful nails  while ambulating in shoes, unable to trim. Patient states that the glucose reading today was not recorded but a couple of days ago was 132. Patient denies any new changes in medication or new problems. Last PCP with Dr. Tobie Lords was 2 weeks ago.  Patient reports that he is dealing with hip pain and is awaiting his evaluation on the 17th for possible consideration for hip surgery.  Patient Active Problem List   Diagnosis Date Noted  . Chest pain 04/08/2015  . Abnormal nuclear stress test 04/08/2015  . Ischemic cardiomyopathy 07/06/2014  . Lumbago 03/13/2014  . Special screening for malignant neoplasms, colon 02/09/2014  . Normocytic anemia 02/09/2014  . Pleural effusion on left 11/28/2013  . Atherosclerosis of native arteries of extremity with intermittent claudication ([place]) 11/07/2013  . Weakness of both legs 11/07/2013  . Smoker 09/19/2013  . Numbness in right leg/ Foot 06/14/2013  . Pain in limb-Right Leg/foot 06/14/2013  . Atherosclerosis of native arteries of the extremities with ulceration(440.23) 06/14/2013  . Chronic systolic heart failure ([place]) 05/29/2013  . Atrial fibrillation ([place]) 05/29/2013  . Chest pain, mid sternal 05/19/2013  . S/P CABG x 5 05/08/2013  . Right foot ulcer ([place]) 05/04/2013  . Coronary atherosclerosis of native coronary artery 04/28/2013  . Acute systolic heart failure ([place]) 04/28/2013  . Abnormal blood chemistry 12/30/2012  . Abnormal LFTs 12/30/2012  . Aspiration pneumonia ([place]) 12/30/2012  . Chronic congestive heart failure ([place]) 12/27/2012  . Leg weakness 12/26/2012  . Pneumonia 05/18/2011  . Gout 05/17/2011  . Respiratory distress, acute 05/17/2011  . Hypertension 05/17/2011  . COPD (chronic obstructive pulmonary disease) ([place])  05/17/2011  . CHF, acute ([place]) 05/17/2011   Current Outpatient Medications on File Prior to Visit  Medication Sig Dispense Refill  . albuterol (PROVENTIL HFA;VENTOLIN HFA) 108 (90 BASE) MCG/ACT inhaler Inhale 1 puff into the lungs every 6 (six) hours as needed for wheezing or shortness of breath.    . allopurinol (ZYLOPRIM) 300 MG tablet Take 300 mg by mouth daily.    Marland Kitchen aspirin EC 81 MG tablet TAKE ONE TABLET BY MOUTH ONCE DAILY 30 tablet 3  . atorvastatin (LIPITOR) 20 MG tablet Take 20 mg by mouth daily.    Marland Kitchen azelastine (OPTIVAR) 0.05 % ophthalmic solution INSTILL 1 DROP INTO BOTH EYES TWICE A DAY  0  . b complex vitamins tablet Take 1 tablet by mouth daily.    . budesonide-formoterol (SYMBICORT) 80-4.5 MCG/ACT inhaler Inhale 2 puffs into the lungs 2 (two) times daily as needed.    . carvedilol (COREG) 6.25 MG tablet Take 1.5 tablets (9.375 mg total) by mouth 2 (two) times daily with a meal. 60 tablet 6  . celecoxib (CELEBREX) 200 MG capsule TAKE 1 CAPSULE BY MOUTH 1 - 2 TIMES DAILY FOR ARTHRITIC HIP PAIN  0  . colchicine (COLCRYS) 0.6 MG tablet Take 0.6 mg by mouth daily as needed (gout).    Marland Kitchen diclofenac sodium (VOLTAREN) 1 % GEL     . furosemide (LASIX) 20 MG tablet TAKE 3 TABLETS BY MOUTH EVERY DAY 270 tablet 0  . gabapentin (NEURONTIN) 300 MG capsule Take 300 mg by mouth 2 (two) times daily.    Marland Kitchen ibuprofen (ADVIL,MOTRIN) 800 MG tablet TAKE 1 TABLET BY MOUTH  3 TIMES A DAY AS NEEDED FOR HIP/ANKLE PAIN  5  . lubiprostone (AMITIZA) 24 MCG capsule Take 24 mcg by mouth 2 (two) times daily with a meal.     . Olopatadine HCl (PATADAY) 0.2 % SOLN Place 1 drop into both eyes 2 (two) times daily.    Marland Kitchen omeprazole (PRILOSEC) 40 MG capsule 20 mg.   8  . potassium chloride SA (K-DUR,KLOR-CON) 20 MEQ tablet Take 1 tablet (20 mEq total) by mouth daily. 30 tablet 0  . sacubitril-valsartan (ENTRESTO) 49-51 MG Take 1 tablet by mouth 2 (two) times daily. 60 tablet 3  . spironolactone (ALDACTONE) 25 MG tablet  Take 1 tablet (25 mg total) by mouth daily. 90 tablet 3  . tiotropium (SPIRIVA) 18 MCG inhalation capsule Place 18 mcg into inhaler and inhale daily.    . traMADol (ULTRAM) 50 MG tablet Take 50 mg by mouth every 6 (six) hours as needed for moderate pain.     No current facility-administered medications on file prior to visit.    Allergies  Allergen Reactions  . Penicillins Other (See Comments) and Nausea And Vomiting    Per Dr Audria Nine Via phone 12/27/12 0400 Swelling , long time ago. Doesn't remember   Objective: General: Patient is awake, alert, and oriented x 3 and in no acute distress.  Integument: Skin is warm, dry and supple bilateral. Continued Moderate plantar>dorsal xerosis bilateral, slowly improving. Nails are tender, long, thickened and dystrophic with subungual debris, consistent with onychomycosis, 1-5 bilateral. No signs of infection. No open lesions or preulcerative lesions present bilateral. Minimal callus to 1st toesRemaining integument unremarkable.  Vasculature:  Dorsalis Pedis pulse 1/4 bilateral. Posterior Tibial pulse  0/4 bilateral.  Capillary fill time <3 sec 1-5 bilateral. Scant hair growth to the level of the digits. Temperature gradient within normal limits. No varicosities present bilateral. Mild trace edema present bilateral ankles.   Neurology: The patient has diminished sensation measured with a 5.07/10g Semmes Weinstein Monofilament at all pedal sites bilateral . Vibratory sensation diminished bilateral with tuning fork. No Babinski sign present bilateral.   Musculoskeletal: Asymptomatioc pes planus and bunion pedal deformities noted bilateral. Muscular strength 5/5 in all lower extremity muscular groups bilateral without pain or limitation on range of motion. No tenderness with calf compression bilateral.  Assessment and Plan: Problem List Items Addressed This Visit      Other   Pain in limb-Right Leg/foot    Other Visit Diagnoses     Dermatophytosis of nail    -  Primary   Type 2 diabetes, controlled, with neuropathy ([place])       Peripheral vascular disease ([place])       Xerosis of skin         -Examined patient. -Discussed and educated patient on diabetic foot care, especially with regards to the vascular, neurological and musculoskeletal systems.  -Mechanically debrided all nails 1-5 bilateral using sterile nail nipper and filed with dremel without incident  -Advised patient to continue with daily skin emollients for dry skin like recommended before -Patient to follow-up with Dr. about his hip -Patient to return in 3 months for at risk foot care  -Patient advised to call the office if any problems or questions arise in the meantime.  Landis Martins, DPM

## 2018-09-06 ENCOUNTER — Encounter: Payer: Medicare HMO | Admitting: Internal Medicine

## 2018-09-07 ENCOUNTER — Telehealth (HOSPITAL_COMMUNITY): Payer: Self-pay

## 2018-09-07 NOTE — Telephone Encounter (Signed)
Triage Appointment Phone Calls  NOTE : At this time, do not cancel POST HOSP Appointments.   Introduction: We are calling today to discuss your upcoming appointment. As you know we are dealing with the Coronavirus COVID-19 and want to take all precautions necessary to protect our patients and staff.   Questions 1. Have you or any one in your house had a fever in the past 2 weeks? no  2. How are you feeling/doing?  a. Is your weight stable? yes BP stable if checking at home? Yes. Home health nurse checks it. Have they needed extra diuretics? no Is their breathing at baseline? yes   -> If asymptomatic, #3. 3. Do you have enough of your cardiac medications to last you for the next 2 months, or do you need refills? Maybe. Wants [place] to call his home health nurse 4. Do you have enough food and supplies to last for at least 2 weeks? yes   5. Reschedule their appointment with the following recommendations:  10/26/2018 10:30a               -> We will remain open, so please call right away if they are developing any worsening symptoms   -> Their appointment may be out 2 months, but depending on the course of the virus, we may open up appointments sooner   -> Encouraged them to sign up for my-chart and let them know we are looking at the possibility of virtual or telephone appointments   Spoke to patient. Pt denies symptoms of worsening heart failure. Pt does believe he needs a refill on one medication but doesn't remember which one. Pt gave me the number to Santiago Glad his home health nurse who manages his pill box. Called her and left a voicemail inquiring which med needs refilled. Denies need for food or supplies. Encouraged pt to call if worsening symptoms occur.

## 2018-09-12 ENCOUNTER — Encounter (HOSPITAL_COMMUNITY): Payer: Self-pay

## 2018-09-14 ENCOUNTER — Ambulatory Visit (HOSPITAL_COMMUNITY)
Admission: RE | Admit: 2018-09-14 | Discharge: 2018-09-14 | Disposition: A | Discharge status: Home / Self Care | Payer: Medicare HMO | Source: Ambulatory Visit | Attending: Cardiology | Admitting: Cardiology

## 2018-09-14 ENCOUNTER — Other Ambulatory Visit: Payer: Self-pay

## 2018-09-14 VITALS — BP 102/70 | HR 73 | Wt 215.0 lb

## 2018-09-14 DIAGNOSIS — F17210 Nicotine dependence, cigarettes, uncomplicated: Secondary | ICD-10-CM

## 2018-09-14 DIAGNOSIS — I255 Ischemic cardiomyopathy: Secondary | ICD-10-CM

## 2018-09-14 DIAGNOSIS — I2510 Atherosclerotic heart disease of native coronary artery without angina pectoris: Secondary | ICD-10-CM

## 2018-09-14 DIAGNOSIS — N183 Chronic kidney disease, stage 3 unspecified: Secondary | ICD-10-CM

## 2018-09-14 DIAGNOSIS — I5022 Chronic systolic (congestive) heart failure: Secondary | ICD-10-CM

## 2018-09-14 DIAGNOSIS — J449 Chronic obstructive pulmonary disease, unspecified: Secondary | ICD-10-CM | POA: Diagnosis not present

## 2018-09-14 DIAGNOSIS — F17200 Nicotine dependence, unspecified, uncomplicated: Secondary | ICD-10-CM

## 2018-09-14 DIAGNOSIS — Z951 Presence of aortocoronary bypass graft: Secondary | ICD-10-CM

## 2018-09-14 NOTE — Progress Notes (Signed)
AVS mailed to pt as he does not have mychart or an email address.

## 2018-09-14 NOTE — Addendum Note (Signed)
Encounter addended by: Scarlette Calico, RN on: 09/14/2018 3:12 PM  Actions taken: Clinical Note Signed

## 2018-09-14 NOTE — Progress Notes (Signed)
Heart Failure TeleHealth Note  Due to national recommendations of social distancing due to [place] 19, telehealth visit is felt to be most appropriate for this patient at this time.  I discussed the limitations, risks, security and privacy concerns of performing an evaluation and management service by telephone and the availability of in person appointments. I also discussed with the patient that there may be a patient responsible charge related to this service. The patient expressed understanding and agreed to proceed.   ID:  [name] [name], DOB [birth year], MRN [id number]  Location: Home  Provider location: [address], [place] Type of Visit: Established patient  PCP:  [name], PA-C  Cardiologist:  Loralie Champagne, MD Primary HF: Dr Aundra Dubin  Chief Complaint: chronic systolic HF, CKD3, COPD, CAD   History of Present Illness: [name] [name] is a 68 yo with history of COPD, HTN, 3V CAD s/p CABG x 5  (CABG 04/2013: LIMA to LAD, SVG to second diagonal, SVG to ramus intermediate and obtuse marginal 2, SVG to posterior descending), ischemic cardiomyopathy, chronic systolic HF (EF 85%), COPD, DM2, PVD and tobacco abuse.  Lexiscan Cardiolite in 12/19 showed inferior infarct, no ischemia.  Echo in 12/19 showed EF 35%, mild LVH, mildly decreased RV systolic function.  ABIs in 12/19 were stable compared to the past.    He presents via audio conferencing for a telehealth visit today. Overall doing fine other than hip pain. Planning to have hip surgery. Activity limited by hip pain. Denies SOB. No edema, bloating, or PND. Sleeps on 2 pillows at baseline. No CP. Has nitro at home to take if needed, but hasn't needed in several months. Rare dizziness if he stands up quickly. Appetite and energy level okay. Taking all medications. No problems getting them. Rare cough with clear sputum. Continues to smoke 2-3 cigarettes/day, which is a huge improvement for him. Weights at home: 215-216 lbs. Limits  fluid and salt intake. No food insecurities presently.  Vitals signs taken by patient at his home: BP: 102/70 HR: 73  He denies symptoms of cough, fevers, chills, or new SOB worrisome for COVID 19.  No sick contacts.   Past Medical History:  Diagnosis Date  . CAD (coronary artery disease)    a. 04/2013 Cath: LM 10, LAD 40p, 36m, D1 50p, LCX 60p, 59m/100m, OM1 50, OM2 100 L-L collats, RCA 100p;  b. CABG x 5: LIMA->LAD, VG->D2, VG->RI->OM2, VG->PDA.  Marland Kitchen Chronic systolic CHF (congestive heart failure) ([place])    a. 04/2013 Echo: EF 15-20%, diff HK, sev HK of inf and ant myocardium, dilated LA,  b. EF 20-25% with restrictive filling pattern, mod RV dysfx, mild MR (10/10/2013);  c.  Echo 12/15: EF 25-30%  . COPD (chronic obstructive pulmonary disease) ([place])   . Diabetes mellitus without complication ([place])   . Emphysema   . Gout   . Hyperlipidemia   . Hypertension   . Ischemic cardiomyopathy   . PVD (peripheral vascular disease) ([place])    a. (10/2013) ABIs: RIGHT 0.63, Waveforms: monophasic;  LEFT 0.78, Waveforms: monophasic  . Tobacco abuse    30+ pack-year history  . Transaminitis    a. 04/2013 Abd U/S and CT unremarkable, hepatitis panel neg -->felt to be 2/2 acute R heart failure.   Past Surgical History:  Procedure Laterality Date  . APPENDECTOMY  as a child  . CARDIAC CATHETERIZATION  04/08/2015   Procedure: Left Heart Cath and Cors/Grafts Angiography;  Surgeon: Peter M Martinique, MD;  Location:  [facility];  Service: Cardiovascular;;  . CORONARY ARTERY BYPASS GRAFT N/A 05/05/2013   Procedure: CORONARY ARTERY BYPASS GRAFTING (CABG) TIMES FIVE USING LEFT INTERNAL MAMMARY ARTERY AND RIGHT AND LEFT SAPHENOUS LEG VEIN HARVESTED ENDOSCOPICALLY;  Surgeon: Melrose Nakayama, MD;  Location: [facility];  Service: Open Heart Surgery;  Laterality: N/A;  . IMPLANTABLE CARDIOVERTER DEFIBRILLATOR IMPLANT N/A 07/06/2014   Procedure: IMPLANTABLE CARDIOVERTER DEFIBRILLATOR IMPLANT;  Surgeon: Deboraha Sprang, MD;  Location: [facility];  Service: Cardiovascular;  Laterality: N/A;  . LEFT AND RIGHT HEART CATHETERIZATION WITH CORONARY ANGIOGRAM N/A 04/28/2013   Procedure: LEFT AND RIGHT HEART CATHETERIZATION WITH CORONARY ANGIOGRAM;  Surgeon: Burnell Blanks, MD;  Location: [facility] C[facility];  Service: Cardiovascular;  Laterality: N/A;  . MULTIPLE EXTRACTIONS WITH ALVEOLOPLASTY N/A 05/03/2013   Procedure: Extraction of tooth #s 3,4,5,6,8,9,27 with alveoloplast and maxillary left osseous tuberosity reduction;  Surgeon: Lenn Cal, DDS;  Location: [facility];  Service: Oral Surgery;  Laterality: N/A;     Current Outpatient Medications  Medication Sig Dispense Refill  . aspirin EC 81 MG tablet TAKE ONE TABLET BY MOUTH ONCE DAILY 30 tablet 3  . atorvastatin (LIPITOR) 20 MG tablet Take 20 mg by mouth daily.    . carvedilol (COREG) 6.25 MG tablet Take 1.5 tablets (9.375 mg total) by mouth 2 (two) times daily with a meal. 60 tablet 6  . furosemide (LASIX) 20 MG tablet TAKE 3 TABLETS BY MOUTH EVERY DAY 270 tablet 0  . potassium chloride SA (K-DUR,KLOR-CON) 20 MEQ tablet Take 1 tablet (20 mEq total) by mouth daily. 30 tablet 0  . sacubitril-valsartan (ENTRESTO) 49-51 MG Take 1 tablet by mouth 2 (two) times daily. 60 tablet 3  . spironolactone (ALDACTONE) 25 MG tablet Take 1 tablet (25 mg total) by mouth daily. 90 tablet 3  . albuterol (PROVENTIL HFA;VENTOLIN HFA) 108 (90 BASE) MCG/ACT inhaler Inhale 1 puff into the lungs every 6 (six) hours as needed for wheezing or shortness of breath.    . allopurinol (ZYLOPRIM) 300 MG tablet Take 300 mg by mouth daily.    Marland Kitchen azelastine (OPTIVAR) 0.05 % ophthalmic solution INSTILL 1 DROP INTO BOTH EYES TWICE A DAY  0  . b complex vitamins tablet Take 1 tablet by mouth daily.    . budesonide-formoterol (SYMBICORT) 80-4.5 MCG/ACT inhaler Inhale 2 puffs into the lungs 2 (two) times daily as needed.    . celecoxib (CELEBREX) 200 MG capsule TAKE 1 CAPSULE BY MOUTH 1 - 2  TIMES DAILY FOR ARTHRITIC HIP PAIN  0  . colchicine (COLCRYS) 0.6 MG tablet Take 0.6 mg by mouth daily as needed (gout).    Marland Kitchen diclofenac sodium (VOLTAREN) 1 % GEL     . gabapentin (NEURONTIN) 300 MG capsule Take 300 mg by mouth 2 (two) times daily.    Marland Kitchen ibuprofen (ADVIL,MOTRIN) 800 MG tablet TAKE 1 TABLET BY MOUTH 3 TIMES A DAY AS NEEDED FOR HIP/ANKLE PAIN  5  . lubiprostone (AMITIZA) 24 MCG capsule Take 24 mcg by mouth 2 (two) times daily with a meal.     . Olopatadine HCl (PATADAY) 0.2 % SOLN Place 1 drop into both eyes 2 (two) times daily.    Marland Kitchen omeprazole (PRILOSEC) 40 MG capsule 20 mg.   8  . tiotropium (SPIRIVA) 18 MCG inhalation capsule Place 18 mcg into inhaler and inhale daily.    . traMADol (ULTRAM) 50 MG tablet Take 50 mg by mouth every 6 (six) hours as needed for  moderate pain.     No current facility-administered medications for this encounter.     Allergies:   Penicillins   Social History:  The patient  reports that he has been smoking cigarettes. He has a 30.00 pack-year smoking history. He has never used smokeless tobacco. He reports that he does not drink alcohol or use drugs.   Family History:  The patient's family history includes Diabetes in his mother; Heart attack in an other family member; Hypertension in an other family member.   ROS:  Please see the history of present illness.   All other systems are personally reviewed and negative.   Exam:  [facility] Call) Lungs: Normal respiratory effort with conversation.  Neuro: Alert & oriented x 3.   Recent Labs: 07/13/2018: BUN 22; Creatinine, Ser 1.88; Potassium 4.0; Sodium 140  Personally reviewed   Wt Readings from Last 3 Encounters:  09/14/18 97.5 kg (215 lb)  07/13/18 100.1 kg (220 lb 9.6 oz)  07/04/18 102.2 kg (225 lb 6.4 oz)     ASSESSMENT AND PLAN:  1. CAD s/p CABG: Had cath in 10/16 with patent grafts, medical management.  Cardiolite in 12/19 showed no ischemia (just prior infarction).  - Continue ASA  81 and statin. Good lipids in 12/19.  - No s/s ischemia. 2. Chronic systolic HF: Ischemic cardiomyopathy, St Jude ICD. Echo 12/19 with EF 35%.  NYHA class II symptoms. Volume sounds stable.   - Continue lasix 60 mg every other day. Reviewed BMET from 1/22.  - Continue Coreg 9.375 mg bid.   - Continue Entresto to 49/51  - Continue spironolactone 25 mg daily.  Creatinine stable at 1.88, K 4.0 on 1/22.  3. COPD/smoking: Has cut down to 2-3 cigarettes/day. Encouraged complete cessation.  4. PAD: Stable claudication and stable ABIs in 12/19. He tries to walk through the pain.  - Needs to quit smoking. He has cut down to 2-3 cigarettes/day.  - Continue ASA and statin.  5. CKD 3 - Reviewed BMET from 1/22. Improved creatinine to 1.88 6. R hip pain: He needs right THR.  Dr Aundra Dubin has given him cardiac clearance. Can hold ASA x 2 days prior.  Continue beta blocker peri-op.    COVID screen The patient does not have any symptoms that suggest any further testing/ screening at this time.  Social distancing reinforced today.  Relevant cardiac medications were reviewed at length with the patient today.  The patient does not have concerns regarding their medications at this time.   Recommended follow-up: Keep follow up in 2 months with Dr Aundra Dubin. I think it would be okay to push back further if needed, but would recommend checking a BMET if he cannot be seen in May.   The following changes were made today: None today.   Labs/ tests ordered today include: None today.   Patient Risk: After full review of this patients clinical status, I feel that they are at moderate risk for cardiac decompensation at this time.  Today, I have spent 15 minutes with the patient with telehealth technology discussing the above.    Signed, Georgiana Shore, NP  09/14/2018 12:54 PM  [facility] [address] [place] [address] [phone] (office) [phone] (fax)\\

## 2018-09-14 NOTE — Patient Instructions (Addendum)
Please continue all current medications  Please keep your next appointment as scheduled on Wed May 6 at 10:30 am  If you have any issues, questions or concerns before that appointment please call our office at [phone]

## 2018-09-19 ENCOUNTER — Telehealth: Payer: Self-pay

## 2018-09-19 DIAGNOSIS — R32 Unspecified urinary incontinence: Secondary | ICD-10-CM | POA: Diagnosis not present

## 2018-09-19 NOTE — Telephone Encounter (Signed)
I called and spoke with patient, he states that he does not have the ability to do virtual visit. Telephone visit would be better. [place] and Dr. Caryl Comes aware.

## 2018-09-19 NOTE — Telephone Encounter (Signed)
Called patient, no answer and I could not leave a message. I am trying to reach patient to set up mychart for appointment tomorrow with Dr. Caryl Comes. Mychart needs to be set up for virtual visit.

## 2018-09-20 ENCOUNTER — Other Ambulatory Visit: Payer: Self-pay

## 2018-09-20 ENCOUNTER — Telehealth (INDEPENDENT_AMBULATORY_CARE_PROVIDER_SITE_OTHER): Payer: Medicare HMO | Admitting: Internal Medicine

## 2018-09-20 DIAGNOSIS — I255 Ischemic cardiomyopathy: Secondary | ICD-10-CM | POA: Diagnosis not present

## 2018-09-20 DIAGNOSIS — Z95810 Presence of automatic (implantable) cardiac defibrillator: Secondary | ICD-10-CM

## 2018-09-20 DIAGNOSIS — I5022 Chronic systolic (congestive) heart failure: Secondary | ICD-10-CM

## 2018-09-20 DIAGNOSIS — I110 Hypertensive heart disease with heart failure: Secondary | ICD-10-CM | POA: Diagnosis not present

## 2018-09-20 NOTE — Progress Notes (Signed)
Electrophysiology TeleHealth Note   Due to national recommendations of social distancing due to COVID 19, an audio/video telehealth visit is felt to be most appropriate for this patient at this time.  See MyChart message from today for the patient's consent to telehealth for [facility].   Date:  09/20/2018   ID:  [name] [name], DOB [birth year], MRN [id number]  Location: patient's home  Provider location: [address], [place]  Evaluation Performed: Follow-up visit  PCP:  [name], PA-C  Cardiologist:  Loralie Champagne, MD   Electrophysiologist:  [name], MD   Chief Complaint:    History of Present Illness:    [name] [name] is a 68 y.o. male who presents via audio/video conferencing for a telehealth visit today.  Since last being seen in our clinic, the patient reports doing very well.  Today, he denies symptoms of palpitations, chest pain, shortness of breath,  lower extremity edema, dizziness, presyncope, or syncope.  The patient is otherwise without complaint today.    He is s/p CABG x 5 (04/2013: LIMA to LAD, SVG to second diagonal, SVG to ramus intermediate and obtuse marginal 2, SVG to posterior descending      DATE TEST EF   1 /15 Echo   20-25 %   7/17 Echo   30-35 %   12/19 Myoview 29% No ischemia  Inferior Defect  12/19 Echo  35%        Date Cr K  3 /18 1.49 5.1  8 /18 2.59(GFR 28) 4.4  1/20 1.88 4.3           The patient denies symptoms of fevers, chills, cough, or new SOB worrisome for COVID 19. *  Past Medical History:  Diagnosis Date  . CAD (coronary artery disease)    a. 04/2013 Cath: LM 10, LAD 40p, 47m, D1 50p, LCX 60p, 34m/100m, OM1 50, OM2 100 L-L collats, RCA 100p;  b. CABG x 5: LIMA->LAD, VG->D2, VG->RI->OM2, VG->PDA.  Marland Kitchen Chronic systolic CHF (congestive heart failure) ([place])    a. 04/2013 Echo: EF 15-20%, diff HK, sev HK of inf and ant myocardium, dilated LA,  b. EF 20-25% with restrictive filling pattern, mod RV  dysfx, mild MR (10/10/2013);  c.  Echo 12/15: EF 25-30%  . COPD (chronic obstructive pulmonary disease) ([place])   . Diabetes mellitus without complication ([place])   . Emphysema   . Gout   . Hyperlipidemia   . Hypertension   . Ischemic cardiomyopathy   . PVD (peripheral vascular disease) ([place])    a. (10/2013) ABIs: RIGHT 0.63, Waveforms: monophasic;  LEFT 0.78, Waveforms: monophasic  . Tobacco abuse    30+ pack-year history  . Transaminitis    a. 04/2013 Abd U/S and CT unremarkable, hepatitis panel neg -->felt to be 2/2 acute R heart failure.    Past Surgical History:  Procedure Laterality Date  . APPENDECTOMY  as a child  . CARDIAC CATHETERIZATION  04/08/2015   Procedure: Left Heart Cath and Cors/Grafts Angiography;  Surgeon: Peter M Martinique, MD;  Location: [facility];  Service: Cardiovascular;;  . CORONARY ARTERY BYPASS GRAFT N/A 05/05/2013   Procedure: CORONARY ARTERY BYPASS GRAFTING (CABG) TIMES FIVE USING LEFT INTERNAL MAMMARY ARTERY AND RIGHT AND LEFT SAPHENOUS LEG VEIN HARVESTED ENDOSCOPICALLY;  Surgeon: Melrose Nakayama, MD;  Location: [facility];  Service: Open Heart Surgery;  Laterality: N/A;  . IMPLANTABLE CARDIOVERTER DEFIBRILLATOR IMPLANT N/A 07/06/2014   Procedure: IMPLANTABLE CARDIOVERTER DEFIBRILLATOR IMPLANT;  Surgeon: Remo Lipps  Peterson Lombard, MD;  Location: [facility] CATH LAB;  Service: Cardiovascular;  Laterality: N/A;  . LEFT AND RIGHT HEART CATHETERIZATION WITH CORONARY ANGIOGRAM N/A 04/28/2013   Procedure: LEFT AND RIGHT HEART CATHETERIZATION WITH CORONARY ANGIOGRAM;  Surgeon: Burnell Blanks, MD;  Location: [facility] CATH LAB;  Service: Cardiovascular;  Laterality: N/A;  . MULTIPLE EXTRACTIONS WITH ALVEOLOPLASTY N/A 05/03/2013   Procedure: Extraction of tooth #s 3,4,5,6,8,9,27 with alveoloplast and maxillary left osseous tuberosity reduction;  Surgeon: Lenn Cal, DDS;  Location: [facility];  Service: Oral Surgery;  Laterality: N/A;    Current Outpatient Medications  Medication  Sig Dispense Refill  . albuterol (PROVENTIL HFA;VENTOLIN HFA) 108 (90 BASE) MCG/ACT inhaler Inhale 1 puff into the lungs every 6 (six) hours as needed for wheezing or shortness of breath.    . allopurinol (ZYLOPRIM) 300 MG tablet Take 300 mg by mouth daily.    Marland Kitchen aspirin EC 81 MG tablet TAKE ONE TABLET BY MOUTH ONCE DAILY 30 tablet 3  . atorvastatin (LIPITOR) 20 MG tablet Take 20 mg by mouth daily.    Marland Kitchen azelastine (OPTIVAR) 0.05 % ophthalmic solution INSTILL 1 DROP INTO BOTH EYES TWICE A DAY  0  . b complex vitamins tablet Take 1 tablet by mouth daily.    . budesonide-formoterol (SYMBICORT) 80-4.5 MCG/ACT inhaler Inhale 2 puffs into the lungs 2 (two) times daily as needed.    . carvedilol (COREG) 6.25 MG tablet Take 1.5 tablets (9.375 mg total) by mouth 2 (two) times daily with a meal. 60 tablet 6  . celecoxib (CELEBREX) 200 MG capsule TAKE 1 CAPSULE BY MOUTH 1 - 2 TIMES DAILY FOR ARTHRITIC HIP PAIN  0  . colchicine (COLCRYS) 0.6 MG tablet Take 0.6 mg by mouth daily as needed (gout).    Marland Kitchen diclofenac sodium (VOLTAREN) 1 % GEL     . furosemide (LASIX) 20 MG tablet TAKE 3 TABLETS BY MOUTH EVERY DAY 270 tablet 0  . ibuprofen (ADVIL,MOTRIN) 800 MG tablet TAKE 1 TABLET BY MOUTH 3 TIMES A DAY AS NEEDED FOR HIP/ANKLE PAIN  5  . lubiprostone (AMITIZA) 24 MCG capsule Take 24 mcg by mouth 2 (two) times daily with a meal.     . Olopatadine HCl (PATADAY) 0.2 % SOLN Place 1 drop into both eyes 2 (two) times daily.    Marland Kitchen omeprazole (PRILOSEC) 40 MG capsule 20 mg.   8  . potassium chloride SA (K-DUR,KLOR-CON) 20 MEQ tablet Take 1 tablet (20 mEq total) by mouth daily. 30 tablet 0  . sacubitril-valsartan (ENTRESTO) 49-51 MG Take 1 tablet by mouth 2 (two) times daily. 60 tablet 3  . spironolactone (ALDACTONE) 25 MG tablet Take 1 tablet (25 mg total) by mouth daily. 90 tablet 3  . tiotropium (SPIRIVA) 18 MCG inhalation capsule Place 18 mcg into inhaler and inhale daily.    . traMADol (ULTRAM) 50 MG tablet Take 50 mg  by mouth every 6 (six) hours as needed for moderate pain.     No current facility-administered medications for this visit.     Allergies:   Penicillins   Social History:  The patient  reports that he has been smoking cigarettes. He has a 30.00 pack-year smoking history. He has never used smokeless tobacco. He reports that he does not drink alcohol or use drugs.   Family History:  The patient's * family history includes Diabetes in his mother; Heart attack in an other family member; Hypertension in an other family member.   ROS:  Please see  the history of present illness.   All other systems are personally reviewed and negative.    Exam:    Vital Signs:  There were no vitals taken for this visit.       Labs/Other Tests and Data Reviewed:    Recent Labs: 07/13/2018: BUN 22; Creatinine, Ser 1.88; Potassium 4.0; Sodium 140   Wt Readings from Last 3 Encounters:  09/14/18 215 lb (97.5 kg)  07/13/18 220 lb 9.6 oz (100.1 kg)  07/04/18 225 lb 6.4 oz (102.2 kg)     Other studies personally reviewed:  Last device remote is reviewed from [facility] PDF dated 1/20 which reveals normal device function, no arrhythmias     ASSESSMENT & PLAN:    Ischemic cardiomyopathy  Congestive heart failure-chronic-systolic  Implantable defibrillator-[employer]. Jude  Right bundle/left anterior fascicular block  Renal insufficiency grade 4  Hypertension  Hyperlipidemia  VT nonsustained  No intercurrent Ventricular tachycardia  Without symptoms of ischemia  Euvolemic by his report continue current meds  Interval Cr is improved   COVID 19 screen The patient denies symptoms of COVID 19 at this time.  The importance of social distancing was discussed today.  Follow-up:  59m Next remote: *29m  Current medicines are reviewed at length with the patient today.   The patient does not have concerns regarding his medicines.  The following changes were made today:  none  Labs/ tests ordered  today include:   No orders of the defined types were placed in this encounter.   Future tests ( post COVID )    in    months  Patient Risk:  after full review of this patients clinical status, I feel that they are at moderate risk at this time.  Today, I have spent 12 minutes with the patient with telehealth technology discussing * .    Signed, [name], MD  09/20/2018 3:39 PM     [facility] [address] [place] [address] [phone] (office) [phone] (fax)

## 2018-09-22 ENCOUNTER — Other Ambulatory Visit: Payer: Self-pay

## 2018-09-22 ENCOUNTER — Ambulatory Visit (INDEPENDENT_AMBULATORY_CARE_PROVIDER_SITE_OTHER): Payer: Medicare HMO | Admitting: *Deleted

## 2018-09-22 DIAGNOSIS — I255 Ischemic cardiomyopathy: Secondary | ICD-10-CM | POA: Diagnosis not present

## 2018-09-23 ENCOUNTER — Telehealth: Payer: Self-pay

## 2018-09-23 LAB — CUP PACEART REMOTE DEVICE CHECK
Battery Remaining Longevity: 64 mo
Battery Remaining Percentage: 63 %
Battery Voltage: 2.93 V
Brady Statistic RV Percent Paced: 1 %
Date Time Interrogation Session: 20200403144001
HighPow Impedance: 75 Ohm
HighPow Impedance: 75 Ohm
Implantable Lead Implant Date: 20160115
Implantable Lead Location: 753860
Implantable Lead Model: 7122
Implantable Pulse Generator Implant Date: 20160115
Lead Channel Impedance Value: 380 Ohm
Lead Channel Pacing Threshold Amplitude: 1 V
Lead Channel Pacing Threshold Pulse Width: 0.5 ms
Lead Channel Sensing Intrinsic Amplitude: 5 mV
Lead Channel Setting Pacing Amplitude: 2.5 V
Lead Channel Setting Pacing Pulse Width: 0.5 ms
Lead Channel Setting Sensing Sensitivity: 0.5 mV
Pulse Gen Serial Number: 7233603

## 2018-09-23 NOTE — Telephone Encounter (Signed)
Spoke with patient to remind of missed remote transmission 

## 2018-09-30 ENCOUNTER — Encounter: Payer: Self-pay | Admitting: Cardiology

## 2018-09-30 NOTE — Progress Notes (Signed)
Remote ICD transmission.   

## 2018-10-18 ENCOUNTER — Other Ambulatory Visit (HOSPITAL_COMMUNITY): Payer: Self-pay | Admitting: Internal Medicine

## 2018-10-19 ENCOUNTER — Other Ambulatory Visit (HOSPITAL_COMMUNITY): Payer: Self-pay | Admitting: Internal Medicine

## 2018-10-21 DIAGNOSIS — R32 Unspecified urinary incontinence: Secondary | ICD-10-CM | POA: Diagnosis not present

## 2018-10-23 DIAGNOSIS — M1990 Unspecified osteoarthritis, unspecified site: Secondary | ICD-10-CM | POA: Diagnosis not present

## 2018-10-23 DIAGNOSIS — I739 Peripheral vascular disease, unspecified: Secondary | ICD-10-CM | POA: Diagnosis not present

## 2018-10-23 DIAGNOSIS — M25552 Pain in left hip: Secondary | ICD-10-CM | POA: Diagnosis not present

## 2018-10-23 DIAGNOSIS — E1151 Type 2 diabetes mellitus with diabetic peripheral angiopathy without gangrene: Secondary | ICD-10-CM | POA: Diagnosis not present

## 2018-10-23 DIAGNOSIS — I499 Cardiac arrhythmia, unspecified: Secondary | ICD-10-CM | POA: Diagnosis not present

## 2018-10-23 DIAGNOSIS — R1084 Generalized abdominal pain: Secondary | ICD-10-CM | POA: Diagnosis not present

## 2018-10-23 DIAGNOSIS — M79605 Pain in left leg: Secondary | ICD-10-CM | POA: Diagnosis not present

## 2018-10-23 DIAGNOSIS — I447 Left bundle-branch block, unspecified: Secondary | ICD-10-CM | POA: Diagnosis not present

## 2018-10-26 ENCOUNTER — Telehealth (HOSPITAL_COMMUNITY): Payer: Medicare HMO

## 2018-10-26 DIAGNOSIS — E119 Type 2 diabetes mellitus without complications: Secondary | ICD-10-CM | POA: Diagnosis not present

## 2018-10-26 DIAGNOSIS — M87051 Idiopathic aseptic necrosis of right femur: Secondary | ICD-10-CM | POA: Diagnosis not present

## 2018-10-26 DIAGNOSIS — I739 Peripheral vascular disease, unspecified: Secondary | ICD-10-CM | POA: Diagnosis not present

## 2018-10-26 DIAGNOSIS — M545 Low back pain: Secondary | ICD-10-CM | POA: Diagnosis not present

## 2018-10-26 DIAGNOSIS — Z6825 Body mass index (BMI) 25.0-25.9, adult: Secondary | ICD-10-CM | POA: Diagnosis not present

## 2018-11-01 ENCOUNTER — Other Ambulatory Visit: Payer: Self-pay

## 2018-11-01 ENCOUNTER — Ambulatory Visit (HOSPITAL_COMMUNITY)
Admission: RE | Admit: 2018-11-01 | Discharge: 2018-11-01 | Disposition: A | Discharge status: Home / Self Care | Payer: Medicare HMO | Source: Ambulatory Visit | Attending: Adult Health | Admitting: Adult Health

## 2018-11-01 VITALS — Wt 215.0 lb

## 2018-11-01 DIAGNOSIS — I5022 Chronic systolic (congestive) heart failure: Secondary | ICD-10-CM

## 2018-11-01 DIAGNOSIS — I2510 Atherosclerotic heart disease of native coronary artery without angina pectoris: Secondary | ICD-10-CM

## 2018-11-01 DIAGNOSIS — F17200 Nicotine dependence, unspecified, uncomplicated: Secondary | ICD-10-CM

## 2018-11-01 DIAGNOSIS — I255 Ischemic cardiomyopathy: Secondary | ICD-10-CM

## 2018-11-01 NOTE — Progress Notes (Signed)
Heart Failure TeleHealth Note  Due to national recommendations of social distancing due to [place] 19, telehealth visit is felt to be most appropriate for this patient at this time.  I discussed the limitations, risks, security and privacy concerns of performing an evaluation and management service by telephone and the availability of in person appointments. I also discussed with the patient that there may be a patient responsible charge related to this service. The patient expressed understanding and agreed to proceed.   ID:  [name] [name], DOB 1951/02/11, MRN [id number]  Location: Home  Provider location: [address], [place] Type of Visit: Established patient   PCP:  [name], PA-C  Cardiologist:  Loralie Champagne, MD  Chief Complaint: HF follow up   History of Present Illness: [name] [name] is a 68 y.o. male with a history of COPD, HTN, 3V CAD s/p CABG x 5 (CABG 04/2013: LIMA to LAD, SVG to second diagonal, SVG to ramus intermediate and obtuse marginal 2, SVG to posterior descending), ischemic cardiomyopathy, chronic systolic HF (EF 57%), COPD, DM2, PVD and tobacco abuse.  Lexiscan Cardiolite in 12/19 showed inferior infarct, no ischemia. Echo in 12/19 showed EF 35%, mild LVH, mildly decreased RV systolic function. ABIs in 12/19 were stable compared to the past.   Patient presents via audio conferencing for a telehealth visit today. He says he had to go to ED in [place] last week with pain in left hip. He got Xrays and what sounds like ABIs, which were okay per his report. Otherwise doing well. He denies SOB, edema, or PND. Chronic 2 pillow orthopnea. No CP or dizziness. Appetite okay. Energy level poor. Taking all medications. Weight stable 215-216 lbs. SBP typically 100s. Was 130s yesterday, but ate a sausage. Limits fluid and salt intake generally.     Pt denies symptoms of cough, fevers, chills, or new SOB worrisome for COVID 19.    Past Medical History:  Diagnosis  Date  . CAD (coronary artery disease)    a. 04/2013 Cath: LM 10, LAD 40p, 57m, D1 50p, LCX 60p, 64m/100m, OM1 50, OM2 100 L-L collats, RCA 100p;  b. CABG x 5: LIMA->LAD, VG->D2, VG->RI->OM2, VG->PDA.  Marland Kitchen Chronic systolic CHF (congestive heart failure) ([place])    a. 04/2013 Echo: EF 15-20%, diff HK, sev HK of inf and ant myocardium, dilated LA,  b. EF 20-25% with restrictive filling pattern, mod RV dysfx, mild MR (10/10/2013);  c.  Echo 12/15: EF 25-30%  . COPD (chronic obstructive pulmonary disease) ([place])   . Diabetes mellitus without complication ([name])   . Emphysema   . Gout   . Hyperlipidemia   . Hypertension   . Ischemic cardiomyopathy   . PVD (peripheral vascular disease) ([place])    a. (10/2013) ABIs: RIGHT 0.63, Waveforms: monophasic;  LEFT 0.78, Waveforms: monophasic  . Tobacco abuse    30+ pack-year history  . Transaminitis    a. 04/2013 Abd U/S and CT unremarkable, hepatitis panel neg -->felt to be 2/2 acute R heart failure.   Past Surgical History:  Procedure Laterality Date  . APPENDECTOMY  as a child  . CARDIAC CATHETERIZATION  04/08/2015   Procedure: Left Heart Cath and Cors/Grafts Angiography;  Surgeon: Peter M Martinique, MD;  Location: [facility];  Service: Cardiovascular;;  . CORONARY ARTERY BYPASS GRAFT N/A 05/05/2013   Procedure: CORONARY ARTERY BYPASS GRAFTING (CABG) TIMES FIVE USING LEFT INTERNAL MAMMARY ARTERY AND RIGHT AND LEFT SAPHENOUS LEG VEIN HARVESTED ENDOSCOPICALLY;  Surgeon:  Melrose Nakayama, MD;  Location: [facility];  Service: Open Heart Surgery;  Laterality: N/A;  . IMPLANTABLE CARDIOVERTER DEFIBRILLATOR IMPLANT N/A 07/06/2014   Procedure: IMPLANTABLE CARDIOVERTER DEFIBRILLATOR IMPLANT;  Surgeon: Deboraha Sprang, MD;  Location: [facility] CATH LAB;  Service: Cardiovascular;  Laterality: N/A;  . LEFT AND RIGHT HEART CATHETERIZATION WITH CORONARY ANGIOGRAM N/A 04/28/2013   Procedure: LEFT AND RIGHT HEART CATHETERIZATION WITH CORONARY ANGIOGRAM;  Surgeon: Burnell Blanks, MD;  Location: [facility] CATH LAB;  Service: Cardiovascular;  Laterality: N/A;  . MULTIPLE EXTRACTIONS WITH ALVEOLOPLASTY N/A 05/03/2013   Procedure: Extraction of tooth #s 3,4,5,6,8,9,27 with alveoloplast and maxillary left osseous tuberosity reduction;  Surgeon: Lenn Cal, DDS;  Location: [facility];  Service: Oral Surgery;  Laterality: N/A;     Current Outpatient Medications  Medication Sig Dispense Refill  . aspirin EC 81 MG tablet TAKE ONE TABLET BY MOUTH ONCE DAILY 30 tablet 3  . atorvastatin (LIPITOR) 20 MG tablet Take 20 mg by mouth daily.    . carvedilol (COREG) 6.25 MG tablet Take 1.5 tablets (9.375 mg total) by mouth 2 (two) times daily with a meal. 60 tablet 6  . furosemide (LASIX) 20 MG tablet TAKE 3 TABLETS BY MOUTH EVERY DAY 270 tablet 0  . potassium chloride SA (K-DUR,KLOR-CON) 20 MEQ tablet Take 1 tablet (20 mEq total) by mouth daily. 30 tablet 0  . sacubitril-valsartan (ENTRESTO) 49-51 MG Take 1 tablet by mouth 2 (two) times daily. 60 tablet 3  . spironolactone (ALDACTONE) 25 MG tablet TAKE 1 TABLET BY MOUTH EVERY DAY 90 tablet 3  . albuterol (PROVENTIL HFA;VENTOLIN HFA) 108 (90 BASE) MCG/ACT inhaler Inhale 1 puff into the lungs every 6 (six) hours as needed for wheezing or shortness of breath.    . allopurinol (ZYLOPRIM) 300 MG tablet Take 300 mg by mouth daily.    Marland Kitchen azelastine (OPTIVAR) 0.05 % ophthalmic solution INSTILL 1 DROP INTO BOTH EYES TWICE A DAY  0  . b complex vitamins tablet Take 1 tablet by mouth daily.    . budesonide-formoterol (SYMBICORT) 80-4.5 MCG/ACT inhaler Inhale 2 puffs into the lungs 2 (two) times daily as needed.    . celecoxib (CELEBREX) 200 MG capsule TAKE 1 CAPSULE BY MOUTH 1 - 2 TIMES DAILY FOR ARTHRITIC HIP PAIN  0  . colchicine (COLCRYS) 0.6 MG tablet Take 0.6 mg by mouth daily as needed (gout).    Marland Kitchen diclofenac sodium (VOLTAREN) 1 % GEL     . ibuprofen (ADVIL,MOTRIN) 800 MG tablet TAKE 1 TABLET BY MOUTH 3 TIMES A DAY AS NEEDED FOR  HIP/ANKLE PAIN  5  . lubiprostone (AMITIZA) 24 MCG capsule Take 24 mcg by mouth 2 (two) times daily with a meal.     . Olopatadine HCl (PATADAY) 0.2 % SOLN Place 1 drop into both eyes 2 (two) times daily.    Marland Kitchen omeprazole (PRILOSEC) 40 MG capsule 20 mg.   8  . tiotropium (SPIRIVA) 18 MCG inhalation capsule Place 18 mcg into inhaler and inhale daily.    . traMADol (ULTRAM) 50 MG tablet Take 50 mg by mouth every 6 (six) hours as needed for moderate pain.     No current facility-administered medications for this encounter.     Allergies:   Penicillins   Social History:  The patient  reports that he has been smoking cigarettes. He has a 30.00 pack-year smoking history. He has never used smokeless tobacco. He reports that he does not drink alcohol or use drugs.  Family History:  The patient's family history includes Diabetes in his mother; Heart attack in an other family member; Hypertension in an other family member.   ROS:  Please see the history of present illness.   All other systems are personally reviewed and negative.    Exam:  (Video/Tele Health Call; Exam is subjective and or/visual.) General:  Speaks in full sentences. No resp difficulty. Lungs: Normal respiratory effort with conversation.  Abdomen: No distension per patient report Extremities: Pt denies edema. Neuro: Alert & oriented x 3.   Recent Labs: 07/13/2018: BUN 22; Creatinine, Ser 1.88; Potassium 4.0; Sodium 140  Personally reviewed   Wt Readings from Last 3 Encounters:  11/01/18 97.5 kg (215 lb)  09/14/18 97.5 kg (215 lb)  07/13/18 100.1 kg (220 lb 9.6 oz)      ASSESSMENT AND PLAN:  1. CAD s/p CABG: Had cath in 10/16 with patent grafts, medical management.Cardiolite in 12/19 showed no ischemia (just prior infarction). - Continue ASA 81 and statin.Good lipids in 12/19. - No s/s ischemia.  2. Chronic systolic HF: Ischemic cardiomyopathy, [employer] ICD. Echo12/19 with EF35%.  -NYHA class II symptoms. Volume  sounds stable.  - Continue lasix 60 mg every other day. Check BMET - Continue Coreg 9.375 mg bid.Will not increase with fatigue. - Continue Entresto 49/51 mg BID - Continue spironolactone 25 mg daily. - Discussed limiting fluid and salt intake. 3. COPD/smoking: Has cut down to 2-3 cigarettes/day. Encouraged complete cessation. No change.  4. PAD: Stable claudicationand stable ABIs in 12/19. He tries to walk through the pain. - Needs to quit smoking. He has cut down to 2-3 cigarettes/day.  - Continue ASA and statin. No change. 5. CKD 3 - Check BMET. 6. R hip pain:He needs right THR. Dr Aundra Dubin has given him cardiac clearance. Can hold ASA x 2 days prior. Continue beta blocker peri-op.  - He wants to know when his surgery will be. I advised that he call his surgeon's office to check in and he can let them know that he is anxious to have it done as soon as COVID restrictions are lifted.   COVID screen The patient does not have any symptoms that suggest any further testing/ screening at this time.  Social distancing reinforced today.  Patient Risk: After full review of this patients clinical status, I feel that they are at moderate risk for cardiac decompensation at this time.  Orders/Follow up: Check BMET at PCP office this week. Follow up in 3 months.   Today, I have spent 12 minutes with the patient with telehealth technology discussing the above issues.    Signed, Georgiana Shore, NP  11/01/2018 1:29 PM   [facility] [address] [facility] [place] [address] [phone] (office) [phone] (fax)

## 2018-11-01 NOTE — Patient Instructions (Addendum)
Please have labs drawn at your PCP. A prescription has been faxed to them.  Please follow up with the Advanced Practice Provider in 3 months. This is scheduled for August 13th at 2:00pm.

## 2018-11-01 NOTE — Progress Notes (Signed)
Spoke to pt. Sent fax for labs to pcp. Made him aware to call and set up an appointment to have labs drawn. Made follow up appointment. No further questions at this time.

## 2018-11-01 NOTE — Addendum Note (Signed)
Encounter addended by: Marlise Eves, RN on: 11/01/2018 1:55 PM  Actions taken: Order list changed, Diagnosis association updated, Clinical Note Signed

## 2018-11-02 NOTE — Progress Notes (Signed)
Need orders for 6-10 surgery in epic

## 2018-11-04 DIAGNOSIS — E7800 Pure hypercholesterolemia, unspecified: Secondary | ICD-10-CM | POA: Diagnosis not present

## 2018-11-04 DIAGNOSIS — E1140 Type 2 diabetes mellitus with diabetic neuropathy, unspecified: Secondary | ICD-10-CM | POA: Diagnosis not present

## 2018-11-04 DIAGNOSIS — E119 Type 2 diabetes mellitus without complications: Secondary | ICD-10-CM | POA: Diagnosis not present

## 2018-11-04 DIAGNOSIS — I509 Heart failure, unspecified: Secondary | ICD-10-CM | POA: Diagnosis not present

## 2018-11-04 DIAGNOSIS — M25552 Pain in left hip: Secondary | ICD-10-CM | POA: Diagnosis not present

## 2018-11-04 DIAGNOSIS — M109 Gout, unspecified: Secondary | ICD-10-CM | POA: Diagnosis not present

## 2018-11-04 DIAGNOSIS — I10 Essential (primary) hypertension: Secondary | ICD-10-CM | POA: Diagnosis not present

## 2018-11-04 DIAGNOSIS — M87051 Idiopathic aseptic necrosis of right femur: Secondary | ICD-10-CM | POA: Diagnosis not present

## 2018-11-04 DIAGNOSIS — I2510 Atherosclerotic heart disease of native coronary artery without angina pectoris: Secondary | ICD-10-CM | POA: Diagnosis not present

## 2018-11-04 DIAGNOSIS — Z79899 Other long term (current) drug therapy: Secondary | ICD-10-CM | POA: Diagnosis not present

## 2018-11-05 ENCOUNTER — Ambulatory Visit: Payer: Self-pay | Admitting: Orthopedic Surgery

## 2018-11-16 ENCOUNTER — Telehealth: Payer: Medicare HMO | Admitting: Internal Medicine

## 2018-11-18 ENCOUNTER — Ambulatory Visit: Payer: Self-pay | Admitting: Orthopedic Surgery

## 2018-11-18 NOTE — H&P (View-Only) (Signed)
TOTAL HIP ADMISSION H&P  Patient is admitted for right total hip arthroplasty.  Subjective:  Chief Complaint: right hip pain  HPI: [name] [name], 68 y.o. male, has a history of pain and functional disability in the right hip(s) due to AVN and patient has failed non-surgical conservative treatments for greater than 12 weeks to include NSAID's and/or analgesics, corticosteriod injections, flexibility and strengthening excercises, use of assistive devices and activity modification.  Onset of symptoms was gradual starting 4 years ago with rapidlly worsening course since that time.The patient noted no past surgery on the right hip(s).  Patient currently rates pain in the right hip at 10 out of 10 with activity. Patient has night pain, worsening of pain with activity and weight bearing, trendelenberg gait, pain that interfers with activities of daily living and pain with passive range of motion. Patient has evidence of subchondral cysts, subchondral sclerosis, periarticular osteophytes and joint space narrowing by imaging studies. This condition presents safety issues increasing the risk of falls. This patient has had avascular necrosis of the hip.  There is no current active infection.  Patient Active Problem List   Diagnosis Date Noted  . Chest pain 04/08/2015  . Abnormal nuclear stress test 04/08/2015  . Ischemic cardiomyopathy 07/06/2014  . Lumbago 03/13/2014  . Special screening for malignant neoplasms, colon 02/09/2014  . Normocytic anemia 02/09/2014  . Pleural effusion on left 11/28/2013  . Atherosclerosis of native arteries of extremity with intermittent claudication ([place]) 11/07/2013  . Weakness of both legs 11/07/2013  . Smoker 09/19/2013  . Numbness in right leg/ Foot 06/14/2013  . Pain in limb-Right Leg/foot 06/14/2013  . Atherosclerosis of native arteries of the extremities with ulceration(440.23) 06/14/2013  . Chronic systolic heart failure ([place]) 05/29/2013  . Atrial fibrillation ([place])  05/29/2013  . Chest pain, mid sternal 05/19/2013  . S/P CABG x 5 05/08/2013  . Right foot ulcer ([place]) 05/04/2013  . Coronary atherosclerosis of native coronary artery 04/28/2013  . Acute systolic heart failure ([place]) 04/28/2013  . Abnormal blood chemistry 12/30/2012  . Abnormal LFTs 12/30/2012  . Aspiration pneumonia ([place]) 12/30/2012  . Chronic congestive heart failure ([place]) 12/27/2012  . Leg weakness 12/26/2012  . Pneumonia 05/18/2011  . Gout 05/17/2011  . Respiratory distress, acute 05/17/2011  . Hypertension 05/17/2011  . COPD (chronic obstructive pulmonary disease) ([place]) 05/17/2011  . CHF, acute ([place]) 05/17/2011   Past Medical History:  Diagnosis Date  . CAD (coronary artery disease)    a. 04/2013 Cath: LM 10, LAD 40p, 15m, D1 50p, LCX 60p, 77m/100m, OM1 50, OM2 100 L-L collats, RCA 100p;  b. CABG x 5: LIMA->LAD, VG->D2, VG->RI->OM2, VG->PDA.  Marland Kitchen Chronic systolic CHF (congestive heart failure) ([place])    a. 04/2013 Echo: EF 15-20%, diff HK, sev HK of inf and ant myocardium, dilated LA,  b. EF 20-25% with restrictive filling pattern, mod RV dysfx, mild MR (10/10/2013);  c.  Echo 12/15: EF 25-30%  . COPD (chronic obstructive pulmonary disease) ([place])   . Diabetes mellitus without complication ([place])   . Emphysema   . Gout   . Hyperlipidemia   . Hypertension   . Ischemic cardiomyopathy   . PVD (peripheral vascular disease) ([place])    a. (10/2013) ABIs: RIGHT 0.63, Waveforms: monophasic;  LEFT 0.78, Waveforms: monophasic  . Tobacco abuse    30+ pack-year history  . Transaminitis    a. 04/2013 Abd U/S and CT unremarkable, hepatitis panel neg -->felt to be 2/2 acute R heart failure.    Past  Surgical History:  Procedure Laterality Date  . APPENDECTOMY  as a child  . CARDIAC CATHETERIZATION  04/08/2015   Procedure: Left Heart Cath and Cors/Grafts Angiography;  Surgeon: Peter M Martinique, MD;  Location: [facility];  Service: Cardiovascular;;  . CORONARY ARTERY BYPASS GRAFT N/A  05/05/2013   Procedure: CORONARY ARTERY BYPASS GRAFTING (CABG) TIMES FIVE USING LEFT INTERNAL MAMMARY ARTERY AND RIGHT AND LEFT SAPHENOUS LEG VEIN HARVESTED ENDOSCOPICALLY;  Surgeon: Melrose Nakayama, MD;  Location: [facility];  Service: Open Heart Surgery;  Laterality: N/A;  . IMPLANTABLE CARDIOVERTER DEFIBRILLATOR IMPLANT N/A 07/06/2014   Procedure: IMPLANTABLE CARDIOVERTER DEFIBRILLATOR IMPLANT;  Surgeon: Deboraha Sprang, MD;  Location: [facility] CATH LAB;  Service: Cardiovascular;  Laterality: N/A;  . LEFT AND RIGHT HEART CATHETERIZATION WITH CORONARY ANGIOGRAM N/A 04/28/2013   Procedure: LEFT AND RIGHT HEART CATHETERIZATION WITH CORONARY ANGIOGRAM;  Surgeon: Burnell Blanks, MD;  Location: [facility] CATH LAB;  Service: Cardiovascular;  Laterality: N/A;  . MULTIPLE EXTRACTIONS WITH ALVEOLOPLASTY N/A 05/03/2013   Procedure: Extraction of tooth #s 3,4,5,6,8,9,27 with alveoloplast and maxillary left osseous tuberosity reduction;  Surgeon: Lenn Cal, DDS;  Location: [facility];  Service: Oral Surgery;  Laterality: N/A;    Current Outpatient Medications  Medication Sig Dispense Refill Last Dose  . albuterol (PROVENTIL HFA;VENTOLIN HFA) 108 (90 BASE) MCG/ACT inhaler Inhale 1 puff into the lungs every 6 (six) hours as needed for wheezing or shortness of breath.   Taking  . allopurinol (ZYLOPRIM) 300 MG tablet Take 300 mg by mouth daily.   Taking  . aspirin EC 81 MG tablet TAKE ONE TABLET BY MOUTH ONCE DAILY 30 tablet 3 Taking  . atorvastatin (LIPITOR) 20 MG tablet Take 20 mg by mouth daily.   Taking  . azelastine (OPTIVAR) 0.05 % ophthalmic solution INSTILL 1 DROP INTO BOTH EYES TWICE A DAY  0 Taking  . b complex vitamins tablet Take 1 tablet by mouth daily.   Taking  . budesonide-formoterol (SYMBICORT) 80-4.5 MCG/ACT inhaler Inhale 2 puffs into the lungs 2 (two) times daily as needed.   Taking  . carvedilol (COREG) 6.25 MG tablet Take 1.5 tablets (9.375 mg total) by mouth 2 (two) times daily with a meal. 60  tablet 6 Taking  . celecoxib (CELEBREX) 200 MG capsule TAKE 1 CAPSULE BY MOUTH 1 - 2 TIMES DAILY FOR ARTHRITIC HIP PAIN  0 Taking  . colchicine (COLCRYS) 0.6 MG tablet Take 0.6 mg by mouth daily as needed (gout).   Taking  . diclofenac sodium (VOLTAREN) 1 % GEL    Taking  . furosemide (LASIX) 20 MG tablet TAKE 3 TABLETS BY MOUTH EVERY DAY 270 tablet 0 Taking  . ibuprofen (ADVIL,MOTRIN) 800 MG tablet TAKE 1 TABLET BY MOUTH 3 TIMES A DAY AS NEEDED FOR HIP/ANKLE PAIN  5 Taking  . lubiprostone (AMITIZA) 24 MCG capsule Take 24 mcg by mouth 2 (two) times daily with a meal.    Taking  . Olopatadine HCl (PATADAY) 0.2 % SOLN Place 1 drop into both eyes 2 (two) times daily.   Taking  . omeprazole (PRILOSEC) 40 MG capsule 20 mg.   8 Taking  . potassium chloride SA (K-DUR,KLOR-CON) 20 MEQ tablet Take 1 tablet (20 mEq total) by mouth daily. 30 tablet 0 Taking  . sacubitril-valsartan (ENTRESTO) 49-51 MG Take 1 tablet by mouth 2 (two) times daily. 60 tablet 3 Taking  . spironolactone (ALDACTONE) 25 MG tablet TAKE 1 TABLET BY MOUTH EVERY DAY 90 tablet 3 Taking  .  tiotropium (SPIRIVA) 18 MCG inhalation capsule Place 18 mcg into inhaler and inhale daily.   Taking  . traMADol (ULTRAM) 50 MG tablet Take 50 mg by mouth every 6 (six) hours as needed for moderate pain.   Taking   No current facility-administered medications for this visit.    Allergies  Allergen Reactions  . Penicillins Other (See Comments) and Nausea And Vomiting    Per Dr Audria Nine Via phone 12/27/12 0400 Swelling , long time ago. Doesn't remember    Social History   Tobacco Use  . Smoking status: Current Every Day Smoker    Packs/day: 1.00    Years: 30.00    Pack years: 30.00    Types: Cigarettes  . Smokeless tobacco: Never Used  . Tobacco comment: 1 pk per day  Substance Use Topics  . Alcohol use: No    Family History  Problem Relation Age of Onset  . Diabetes Mother   . Hypertension Other   . Heart attack Other         Brothers  . Stroke Neg Hx      Review of Systems  Constitutional: Positive for diaphoresis.  HENT: Negative.   Eyes: Positive for blurred vision.  Respiratory: Positive for cough and wheezing.   Cardiovascular: Negative.   Gastrointestinal: Negative.   Genitourinary: Positive for frequency.  Musculoskeletal: Positive for back pain, joint pain and myalgias.  Skin: Negative.   Neurological: Positive for dizziness.  Endo/Heme/Allergies: Negative.   Psychiatric/Behavioral: Negative.     Objective:  Physical Exam  Vitals reviewed. Constitutional: He is oriented to person, place, and time. He appears well-developed and well-nourished.  HENT:  Head: Normocephalic and atraumatic.  Eyes: Pupils are equal, round, and reactive to light. Conjunctivae and EOM are normal.  Neck: Normal range of motion. Neck supple.  Cardiovascular: Normal rate, regular rhythm and intact distal pulses.  Respiratory: Effort normal. No respiratory distress.  GI: Soft. He exhibits no distension.  Genitourinary:    Genitourinary Comments: deferred   Musculoskeletal:     Right hip: He exhibits decreased range of motion and bony tenderness.  Neurological: He is alert and oriented to person, place, and time. He has normal reflexes.  Skin: Skin is warm and dry.  Psychiatric: He has a normal mood and affect.    Vital signs in last 24 hours: @VSRANGES @  Labs:   Estimated body mass index is 33.67 kg/m as calculated from the following:   Height as of 06/08/18: 5\' 7"  (1.702 m).   Weight as of 11/01/18: 97.5 kg.   Imaging Review Plain radiographs demonstrate severe degenerative joint disease of the right hip(s). The bone quality appears to be adequate for age and reported activity level.      Assessment/Plan:  AVN, right hip(s)  The patient history, physical examination, clinical judgement of the provider and imaging studies are consistent with end stage degenerative joint disease of the right hip(s)  and total hip arthroplasty is deemed medically necessary. The treatment options including medical management, injection therapy, arthroscopy and arthroplasty were discussed at length. The risks and benefits of total hip arthroplasty were presented and reviewed. The risks due to aseptic loosening, infection, stiffness, dislocation/subluxation,  thromboembolic complications and other imponderables were discussed.  The patient acknowledged the explanation, agreed to proceed with the plan and consent was signed. Patient is being admitted for inpatient treatment for surgery, pain control, PT, OT, prophylactic antibiotics, VTE prophylaxis, progressive ambulation and ADL's and discharge planning.The patient is planning to be discharged  home with HEP   Anticipated LOS equal to or greater than 2 midnights due to - Age 54 and older with one or more of the following:  - Obesity  - Expected need for hospital services (PT, OT, Nursing) required for safe  discharge  - Anticipated need for postoperative skilled nursing care or inpatient rehab  - Active co-morbidities: Coronary Artery Disease OR   - Unanticipated findings during/Post Surgery: None  - Patient is a high risk of re-admission due to: None

## 2018-11-18 NOTE — H&P (Signed)
TOTAL HIP ADMISSION H&P  Patient is admitted for right total hip arthroplasty.  Subjective:  Chief Complaint: right hip pain  HPI: [name] [name], 68 y.o. male, has a history of pain and functional disability in the right hip(s) due to AVN and patient has failed non-surgical conservative treatments for greater than 12 weeks to include NSAID's and/or analgesics, corticosteriod injections, flexibility and strengthening excercises, use of assistive devices and activity modification.  Onset of symptoms was gradual starting 4 years ago with rapidlly worsening course since that time.The patient noted no past surgery on the right hip(s).  Patient currently rates pain in the right hip at 10 out of 10 with activity. Patient has night pain, worsening of pain with activity and weight bearing, trendelenberg gait, pain that interfers with activities of daily living and pain with passive range of motion. Patient has evidence of subchondral cysts, subchondral sclerosis, periarticular osteophytes and joint space narrowing by imaging studies. This condition presents safety issues increasing the risk of falls. This patient has had avascular necrosis of the hip.  There is no current active infection.  Patient Active Problem List   Diagnosis Date Noted  . Chest pain 04/08/2015  . Abnormal nuclear stress test 04/08/2015  . Ischemic cardiomyopathy 07/06/2014  . Lumbago 03/13/2014  . Special screening for malignant neoplasms, colon 02/09/2014  . Normocytic anemia 02/09/2014  . Pleural effusion on left 11/28/2013  . Atherosclerosis of native arteries of extremity with intermittent claudication ([place]) 11/07/2013  . Weakness of both legs 11/07/2013  . Smoker 09/19/2013  . Numbness in right leg/ Foot 06/14/2013  . Pain in limb-Right Leg/foot 06/14/2013  . Atherosclerosis of native arteries of the extremities with ulceration(440.23) 06/14/2013  . Chronic systolic heart failure ([place]) 05/29/2013  . Atrial fibrillation ([place])  05/29/2013  . Chest pain, mid sternal 05/19/2013  . S/P CABG x 5 05/08/2013  . Right foot ulcer ([place]) 05/04/2013  . Coronary atherosclerosis of native coronary artery 04/28/2013  . Acute systolic heart failure ([place]) 04/28/2013  . Abnormal blood chemistry 12/30/2012  . Abnormal LFTs 12/30/2012  . Aspiration pneumonia ([place]) 12/30/2012  . Chronic congestive heart failure ([place]) 12/27/2012  . Leg weakness 12/26/2012  . Pneumonia 05/18/2011  . Gout 05/17/2011  . Respiratory distress, acute 05/17/2011  . Hypertension 05/17/2011  . COPD (chronic obstructive pulmonary disease) ([place]) 05/17/2011  . CHF, acute ([place]) 05/17/2011   Past Medical History:  Diagnosis Date  . CAD (coronary artery disease)    a. 04/2013 Cath: LM 10, LAD 40p, 15m, D1 50p, LCX 60p, 70m/100m, OM1 50, OM2 100 L-L collats, RCA 100p;  b. CABG x 5: LIMA->LAD, VG->D2, VG->RI->OM2, VG->PDA.  Marland Kitchen Chronic systolic CHF (congestive heart failure) ([place])    a. 04/2013 Echo: EF 15-20%, diff HK, sev HK of inf and ant myocardium, dilated LA,  b. EF 20-25% with restrictive filling pattern, mod RV dysfx, mild MR (10/10/2013);  c.  Echo 12/15: EF 25-30%  . COPD (chronic obstructive pulmonary disease) ([place])   . Diabetes mellitus without complication ([place])   . Emphysema   . Gout   . Hyperlipidemia   . Hypertension   . Ischemic cardiomyopathy   . PVD (peripheral vascular disease) ([place])    a. (10/2013) ABIs: RIGHT 0.63, Waveforms: monophasic;  LEFT 0.78, Waveforms: monophasic  . Tobacco abuse    30+ pack-year history  . Transaminitis    a. 04/2013 Abd U/S and CT unremarkable, hepatitis panel neg -->felt to be 2/2 acute R heart failure.    Past  Surgical History:  Procedure Laterality Date  . APPENDECTOMY  as a child  . CARDIAC CATHETERIZATION  04/08/2015   Procedure: Left Heart Cath and Cors/Grafts Angiography;  Surgeon: Peter M Martinique, MD;  Location: [place] [facility];  Service: Cardiovascular;;  . CORONARY ARTERY BYPASS GRAFT N/A  05/05/2013   Procedure: CORONARY ARTERY BYPASS GRAFTING (CABG) TIMES FIVE USING LEFT INTERNAL MAMMARY ARTERY AND RIGHT AND LEFT SAPHENOUS LEG VEIN HARVESTED ENDOSCOPICALLY;  Surgeon: Melrose Nakayama, MD;  Location: [facility];  Service: Open Heart Surgery;  Laterality: N/A;  . IMPLANTABLE CARDIOVERTER DEFIBRILLATOR IMPLANT N/A 07/06/2014   Procedure: IMPLANTABLE CARDIOVERTER DEFIBRILLATOR IMPLANT;  Surgeon: Deboraha Sprang, MD;  Location: [facility] CATH LAB;  Service: Cardiovascular;  Laterality: N/A;  . LEFT AND RIGHT HEART CATHETERIZATION WITH CORONARY ANGIOGRAM N/A 04/28/2013   Procedure: LEFT AND RIGHT HEART CATHETERIZATION WITH CORONARY ANGIOGRAM;  Surgeon: Burnell Blanks, MD;  Location: [facility] CATH LAB;  Service: Cardiovascular;  Laterality: N/A;  . MULTIPLE EXTRACTIONS WITH ALVEOLOPLASTY N/A 05/03/2013   Procedure: Extraction of tooth #s 3,4,5,6,8,9,27 with alveoloplast and maxillary left osseous tuberosity reduction;  Surgeon: Lenn Cal, DDS;  Location: [place];  Service: Oral Surgery;  Laterality: N/A;    Current Outpatient Medications  Medication Sig Dispense Refill Last Dose  . albuterol (PROVENTIL HFA;VENTOLIN HFA) 108 (90 BASE) MCG/ACT inhaler Inhale 1 puff into the lungs every 6 (six) hours as needed for wheezing or shortness of breath.   Taking  . allopurinol (ZYLOPRIM) 300 MG tablet Take 300 mg by mouth daily.   Taking  . aspirin EC 81 MG tablet TAKE ONE TABLET BY MOUTH ONCE DAILY 30 tablet 3 Taking  . atorvastatin (LIPITOR) 20 MG tablet Take 20 mg by mouth daily.   Taking  . azelastine (OPTIVAR) 0.05 % ophthalmic solution INSTILL 1 DROP INTO BOTH EYES TWICE A DAY  0 Taking  . b complex vitamins tablet Take 1 tablet by mouth daily.   Taking  . budesonide-formoterol (SYMBICORT) 80-4.5 MCG/ACT inhaler Inhale 2 puffs into the lungs 2 (two) times daily as needed.   Taking  . carvedilol (COREG) 6.25 MG tablet Take 1.5 tablets (9.375 mg total) by mouth 2 (two) times daily with a meal. 60  tablet 6 Taking  . celecoxib (CELEBREX) 200 MG capsule TAKE 1 CAPSULE BY MOUTH 1 - 2 TIMES DAILY FOR ARTHRITIC HIP PAIN  0 Taking  . colchicine (COLCRYS) 0.6 MG tablet Take 0.6 mg by mouth daily as needed (gout).   Taking  . diclofenac sodium (VOLTAREN) 1 % GEL    Taking  . furosemide (LASIX) 20 MG tablet TAKE 3 TABLETS BY MOUTH EVERY DAY 270 tablet 0 Taking  . ibuprofen (ADVIL,MOTRIN) 800 MG tablet TAKE 1 TABLET BY MOUTH 3 TIMES A DAY AS NEEDED FOR HIP/ANKLE PAIN  5 Taking  . lubiprostone (AMITIZA) 24 MCG capsule Take 24 mcg by mouth 2 (two) times daily with a meal.    Taking  . Olopatadine HCl (PATADAY) 0.2 % SOLN Place 1 drop into both eyes 2 (two) times daily.   Taking  . omeprazole (PRILOSEC) 40 MG capsule 20 mg.   8 Taking  . potassium chloride SA (K-DUR,KLOR-CON) 20 MEQ tablet Take 1 tablet (20 mEq total) by mouth daily. 30 tablet 0 Taking  . sacubitril-valsartan (ENTRESTO) 49-51 MG Take 1 tablet by mouth 2 (two) times daily. 60 tablet 3 Taking  . spironolactone (ALDACTONE) 25 MG tablet TAKE 1 TABLET BY MOUTH EVERY DAY 90 tablet 3 Taking  .  tiotropium (SPIRIVA) 18 MCG inhalation capsule Place 18 mcg into inhaler and inhale daily.   Taking  . traMADol (ULTRAM) 50 MG tablet Take 50 mg by mouth every 6 (six) hours as needed for moderate pain.   Taking   No current facility-administered medications for this visit.    Allergies  Allergen Reactions  . Penicillins Other (See Comments) and Nausea And Vomiting    Per Dr Audria Nine Via phone 12/27/12 0400 Swelling , long time ago. Doesn't remember    Social History   Tobacco Use  . Smoking status: Current Every Day Smoker    Packs/day: 1.00    Years: 30.00    Pack years: 30.00    Types: Cigarettes  . Smokeless tobacco: Never Used  . Tobacco comment: 1 pk per day  Substance Use Topics  . Alcohol use: No    Family History  Problem Relation Age of Onset  . Diabetes Mother   . Hypertension Other   . Heart attack Other         Brothers  . Stroke Neg Hx      Review of Systems  Constitutional: Positive for diaphoresis.  HENT: Negative.   Eyes: Positive for blurred vision.  Respiratory: Positive for cough and wheezing.   Cardiovascular: Negative.   Gastrointestinal: Negative.   Genitourinary: Positive for frequency.  Musculoskeletal: Positive for back pain, joint pain and myalgias.  Skin: Negative.   Neurological: Positive for dizziness.  Endo/Heme/Allergies: Negative.   Psychiatric/Behavioral: Negative.     Objective:  Physical Exam  Vitals reviewed. Constitutional: He is oriented to person, place, and time. He appears well-developed and well-nourished.  HENT:  Head: Normocephalic and atraumatic.  Eyes: Pupils are equal, round, and reactive to light. Conjunctivae and EOM are normal.  Neck: Normal range of motion. Neck supple.  Cardiovascular: Normal rate, regular rhythm and intact distal pulses.  Respiratory: Effort normal. No respiratory distress.  GI: Soft. He exhibits no distension.  Genitourinary:    Genitourinary Comments: deferred   Musculoskeletal:     Right hip: He exhibits decreased range of motion and bony tenderness.  Neurological: He is alert and oriented to person, place, and time. He has normal reflexes.  Skin: Skin is warm and dry.  Psychiatric: He has a normal mood and affect.    Vital signs in last 24 hours: @VSRANGES @  Labs:   Estimated body mass index is 33.67 kg/m as calculated from the following:   Height as of 06/08/18: 5\' 7"  (1.702 m).   Weight as of 11/01/18: 97.5 kg.   Imaging Review Plain radiographs demonstrate severe degenerative joint disease of the right hip(s). The bone quality appears to be adequate for age and reported activity level.      Assessment/Plan:  AVN, right hip(s)  The patient history, physical examination, clinical judgement of the provider and imaging studies are consistent with end stage degenerative joint disease of the right hip(s)  and total hip arthroplasty is deemed medically necessary. The treatment options including medical management, injection therapy, arthroscopy and arthroplasty were discussed at length. The risks and benefits of total hip arthroplasty were presented and reviewed. The risks due to aseptic loosening, infection, stiffness, dislocation/subluxation,  thromboembolic complications and other imponderables were discussed.  The patient acknowledged the explanation, agreed to proceed with the plan and consent was signed. Patient is being admitted for inpatient treatment for surgery, pain control, PT, OT, prophylactic antibiotics, VTE prophylaxis, progressive ambulation and ADL's and discharge planning.The patient is planning to be discharged  home with HEP   Anticipated LOS equal to or greater than 2 midnights due to - Age 48 and older with one or more of the following:  - Obesity  - Expected need for hospital services (PT, OT, Nursing) required for safe  discharge  - Anticipated need for postoperative skilled nursing care or inpatient rehab  - Active co-morbidities: Coronary Artery Disease OR   - Unanticipated findings during/Post Surgery: None  - Patient is a high risk of re-admission due to: None

## 2018-11-21 ENCOUNTER — Ambulatory Visit (INDEPENDENT_AMBULATORY_CARE_PROVIDER_SITE_OTHER): Payer: Medicare HMO | Admitting: Student

## 2018-11-21 ENCOUNTER — Other Ambulatory Visit: Payer: Self-pay

## 2018-11-21 ENCOUNTER — Encounter: Payer: Self-pay | Admitting: Nurse Practitioner

## 2018-11-21 VITALS — BP 84/60 | HR 79 | Ht 67.0 in | Wt 210.8 lb

## 2018-11-21 DIAGNOSIS — Z95810 Presence of automatic (implantable) cardiac defibrillator: Secondary | ICD-10-CM | POA: Diagnosis not present

## 2018-11-21 DIAGNOSIS — I5022 Chronic systolic (congestive) heart failure: Secondary | ICD-10-CM

## 2018-11-21 DIAGNOSIS — N183 Chronic kidney disease, stage 3 unspecified: Secondary | ICD-10-CM

## 2018-11-21 DIAGNOSIS — I10 Essential (primary) hypertension: Secondary | ICD-10-CM | POA: Diagnosis not present

## 2018-11-21 LAB — CUP PACEART INCLINIC DEVICE CHECK
Battery Remaining Longevity: 63 mo
Brady Statistic RV Percent Paced: 0 %
Date Time Interrogation Session: 20200601115721
HighPow Impedance: 74.25 Ohm
Implantable Lead Implant Date: 20160115
Implantable Lead Location: 753860
Implantable Lead Model: 7122
Implantable Pulse Generator Implant Date: 20160115
Lead Channel Impedance Value: 375 Ohm
Lead Channel Pacing Threshold Amplitude: 0.75 V
Lead Channel Pacing Threshold Amplitude: 0.75 V
Lead Channel Pacing Threshold Pulse Width: 0.5 ms
Lead Channel Pacing Threshold Pulse Width: 0.5 ms
Lead Channel Sensing Intrinsic Amplitude: 5.7 mV
Lead Channel Setting Pacing Amplitude: 2.5 V
Lead Channel Setting Pacing Pulse Width: 0.5 ms
Lead Channel Setting Sensing Sensitivity: 0.5 mV
Pulse Gen Serial Number: 7233603

## 2018-11-21 NOTE — Patient Instructions (Addendum)
Medication Instructions:  NONE If you need a refill on your cardiac medications before your next appointment, please call your pharmacy.   Lab work: NONE If you have labs (blood work) drawn today and your tests are completely normal, you will receive your results only by: Marland Kitchen MyChart Message (if you have MyChart) OR . A paper copy in the mail If you have any lab test that is abnormal or we need to change your treatment, we will call you to review the results.  Testing/Procedures: NONE  Follow-Up: At [facility], you and your health needs are our priority.  As part of our continuing mission to provide you with exceptional heart care, we have created designated Provider Care Teams.  These Care Teams include your primary Cardiologist (physician) and Advanced Practice Providers (APPs -  Physician Assistants and Nurse Practitioners) who all work together to provide you with the care you need, when you need it. You will need a follow up appointment in 1 years.  Please call our office 2 months in advance to schedule this appointment.  You may see [name], MD or one of the following Advanced Practice Providers on your designated Care Team:   Chanetta Marshall, NP . Tommye Standard, PA-C  Any Other Special Instructions Will Be Listed Below (If Applicable). Referral To Congestive Heart Failure Clinic.  Remote monitoring is used to monitor your  ICD from home. This monitoring reduces the number of office visits required to check your device to one time per year. It allows [place] to keep an eye on the functioning of your device to ensure it is working properly. You are scheduled for a device check from home on 12/22/2018. You may send your transmission at any time that day. If you have a wireless device, the transmission will be sent automatically. After your physician reviews your transmission, you will receive a postcard with your next transmission date.

## 2018-11-21 NOTE — Progress Notes (Signed)
Electrophysiology Office Note Date: 11/21/2018  ID:  [name] [name], DOB 01-14-1951, MRN [id number]  PCP: [name], PA-C Primary Cardiologist: Loralie Champagne, MD Electrophysiologist: [name], MD  CC: Routine ICD follow-up  [name] [name] is a 68 y.o. male seen today for Dr. Caryl Comes.  They present today for routine electrophysiology followup.  Since last being seen in our clinic, the patient reports doing very well. He has upcoming R hip surgery (Total Hip) and has been cleared by Dr. Aundra Dubin. BP low on arrival today, but he had taken his medicine before coming. States his BP runs fine at home. Denies lightheadedness or dizziness. No syncope or pre-syncope. Has not needed to take any extra lasix. They deny chest pain, palpitations, dyspnea, PND, orthopnea, nausea, vomiting, dizziness, edema, weight gain, or early satiety. He has not had ICD shocks.   Device History: St. Jude single chamber ICD implanted 06/2014 for Chronic systolic CHF History of appropriate therapy: No History of AAD therapy: No   Past Medical History:  Diagnosis Date  . CAD (coronary artery disease)    a. 04/2013 Cath: LM 10, LAD 40p, 57m, D1 50p, LCX 60p, 91m/100m, OM1 50, OM2 100 L-L collats, RCA 100p;  b. CABG x 5: LIMA->LAD, VG->D2, VG->RI->OM2, VG->PDA.  Marland Kitchen Chronic systolic CHF (congestive heart failure) ([place])    a. 04/2013 Echo: EF 15-20%, diff HK, sev HK of inf and ant myocardium, dilated LA,  b. EF 20-25% with restrictive filling pattern, mod RV dysfx, mild MR (10/10/2013);  c.  Echo 12/15: EF 25-30%  . COPD (chronic obstructive pulmonary disease) ([place])   . Diabetes mellitus without complication ([place])   . Emphysema   . Gout   . Hyperlipidemia   . Hypertension   . Ischemic cardiomyopathy   . PVD (peripheral vascular disease) ([place])    a. (10/2013) ABIs: RIGHT 0.63, Waveforms: monophasic;  LEFT 0.78, Waveforms: monophasic  . Tobacco abuse    30+ pack-year history  . Transaminitis    a. 04/2013 Abd U/S and CT  unremarkable, hepatitis panel neg -->felt to be 2/2 acute R heart failure.   Past Surgical History:  Procedure Laterality Date  . APPENDECTOMY  as a child  . CARDIAC CATHETERIZATION  04/08/2015   Procedure: Left Heart Cath and Cors/Grafts Angiography;  Surgeon: Peter M Martinique, MD;  Location: [place] CV LAB;  Service: Cardiovascular;;  . CORONARY ARTERY BYPASS GRAFT N/A 05/05/2013   Procedure: CORONARY ARTERY BYPASS GRAFTING (CABG) TIMES FIVE USING LEFT INTERNAL MAMMARY ARTERY AND RIGHT AND LEFT SAPHENOUS LEG VEIN HARVESTED ENDOSCOPICALLY;  Surgeon: Melrose Nakayama, MD;  Location: [place];  Service: Open Heart Surgery;  Laterality: N/A;  . IMPLANTABLE CARDIOVERTER DEFIBRILLATOR IMPLANT N/A 07/06/2014   Procedure: IMPLANTABLE CARDIOVERTER DEFIBRILLATOR IMPLANT;  Surgeon: Deboraha Sprang, MD;  Location: [facility] CATH LAB;  Service: Cardiovascular;  Laterality: N/A;  . LEFT AND RIGHT HEART CATHETERIZATION WITH CORONARY ANGIOGRAM N/A 04/28/2013   Procedure: LEFT AND RIGHT HEART CATHETERIZATION WITH CORONARY ANGIOGRAM;  Surgeon: Burnell Blanks, MD;  Location: [facility] CATH LAB;  Service: Cardiovascular;  Laterality: N/A;  . MULTIPLE EXTRACTIONS WITH ALVEOLOPLASTY N/A 05/03/2013   Procedure: Extraction of tooth #s 3,4,5,6,8,9,27 with alveoloplast and maxillary left osseous tuberosity reduction;  Surgeon: Lenn Cal, DDS;  Location: [place];  Service: Oral Surgery;  Laterality: N/A;    Current Outpatient Medications  Medication Sig Dispense Refill  . albuterol (PROVENTIL HFA;VENTOLIN HFA) 108 (90 BASE) MCG/ACT inhaler Inhale 1 puff into the lungs every 6 (six)  hours as needed for wheezing or shortness of breath.    . allopurinol (ZYLOPRIM) 300 MG tablet Take 300 mg by mouth daily.    Marland Kitchen aspirin EC 81 MG tablet TAKE ONE TABLET BY MOUTH ONCE DAILY (Patient taking differently: Take 81 mg by mouth daily. ) 30 tablet 3  . atorvastatin (LIPITOR) 20 MG tablet Take 20 mg by mouth daily.    Marland Kitchen azelastine  (OPTIVAR) 0.05 % ophthalmic solution Place 1 drop into the left eye 2 (two) times daily.   0  . b complex vitamins tablet Take 1 tablet by mouth daily.    . budesonide-formoterol (SYMBICORT) 80-4.5 MCG/ACT inhaler Inhale 2 puffs into the lungs 2 (two) times a day.     . carvedilol (COREG) 6.25 MG tablet Take 1.5 tablets (9.375 mg total) by mouth 2 (two) times daily with a meal. 60 tablet 6  . celecoxib (CELEBREX) 200 MG capsule Take 200 mg by mouth daily.   0  . colchicine (COLCRYS) 0.6 MG tablet Take 0.6 mg by mouth daily as needed (gout).    Marland Kitchen diclofenac sodium (VOLTAREN) 1 % GEL Apply 2 g topically 2 (two) times daily as needed (hip pain).     . furosemide (LASIX) 20 MG tablet TAKE 3 TABLETS BY MOUTH EVERY DAY (Patient taking differently: Take 20 mg by mouth daily. ) 270 tablet 0  . ibuprofen (ADVIL,MOTRIN) 800 MG tablet Take 800 mg by mouth 3 (three) times daily as needed for moderate pain.   5  . nicotine (NICODERM CQ - DOSED IN MG/24 HOURS) 21 mg/24hr patch Place 21 mg onto the skin daily.    Marland Kitchen omeprazole (PRILOSEC) 40 MG capsule Take 40 mg by mouth daily.   8  . potassium chloride SA (K-DUR,KLOR-CON) 20 MEQ tablet Take 1 tablet (20 mEq total) by mouth daily. 30 tablet 0  . sacubitril-valsartan (ENTRESTO) 49-51 MG Take 1 tablet by mouth 2 (two) times daily. 60 tablet 3  . spironolactone (ALDACTONE) 25 MG tablet TAKE 1 TABLET BY MOUTH EVERY DAY (Patient taking differently: Take 25 mg by mouth daily. ) 90 tablet 3  . tiotropium (SPIRIVA) 18 MCG inhalation capsule Place 18 mcg into inhaler and inhale daily.    Marland Kitchen tiZANidine (ZANAFLEX) 4 MG tablet Take 4 mg by mouth 3 (three) times daily.     No current facility-administered medications for this visit.     Allergies:   Penicillins   Social History: Social History   Socioeconomic History  . Marital status: Single    Spouse name: Not on file  . Number of children: Not on file  . Years of education: Not on file  . Highest education level:  Not on file  Occupational History  . Occupation: Retired  Scientific laboratory technician  . Financial resource strain: Not on file  . Food insecurity:    Worry: Not on file    Inability: Not on file  . Transportation needs:    Medical: Not on file    Non-medical: Not on file  Tobacco Use  . Smoking status: Current Every Day Smoker    Packs/day: 1.00    Years: 30.00    Pack years: 30.00    Types: Cigarettes  . Smokeless tobacco: Never Used  . Tobacco comment: 1 pk per day  Substance and Sexual Activity  . Alcohol use: No  . Drug use: No  . Sexual activity: Not on file  Lifestyle  . Physical activity:    Days per week: Not on  file    Minutes per session: Not on file  . Stress: Not on file  Relationships  . Social connections:    Talks on phone: Not on file    Gets together: Not on file    Attends religious service: Not on file    Active member of club or organization: Not on file    Attends meetings of clubs or organizations: Not on file    Relationship status: Not on file  . Intimate partner violence:    Fear of current or ex partner: Not on file    Emotionally abused: Not on file    Physically abused: Not on file    Forced sexual activity: Not on file  Other Topics Concern  . Not on file  Social History Narrative   Lives in [place] with his girlfriend and her daughter.      Family History: Family History  Problem Relation Age of Onset  . Diabetes Mother   . Hypertension Other   . Heart attack Other        Brothers  . Stroke Neg Hx     Review of Systems: All other systems reviewed and are otherwise negative except as noted above.   Physical Exam: There were no vitals filed for this visit.   GEN- The patient is well appearing, alert and oriented x 3 today.   HEENT: normocephalic, atraumatic; sclera clear, conjunctiva pink; hearing intact; oropharynx clear; neck supple, no JVP Lymph- no cervical lymphadenopathy Lungs- Clear to ausculation bilaterally, normal work of  breathing.  No wheezes, rales, rhonchi Heart- Regular rate and rhythm, no murmurs, rubs or gallops, PMI not laterally displaced GI- soft, non-tender, non-distended, bowel sounds present, no hepatosplenomegaly Extremities- no clubbing, cyanosis, or edema; DP/PT/radial pulses 2+ bilaterally MS- no significant deformity or atrophy Skin- warm and dry, no rash or lesion; ICD pocket well healed Psych- euthymic mood, full affect Neuro- strength and sensation are intact  ICD interrogation- reviewed in detail today,  See PACEART report  EKG:  EKG is not ordered today.  Recent Labs: 07/13/2018: BUN 22; Creatinine, Ser 1.88; Potassium 4.0; Sodium 140   Wt Readings from Last 3 Encounters:  11/01/18 215 lb (97.5 kg)  09/14/18 215 lb (97.5 kg)  07/13/18 220 lb 9.6 oz (100.1 kg)     Other studies Reviewed: Additional studies/ records that were reviewed today include: Previous echo and most recent labwork.   Assessment and Plan:  1.  Chronic systolic dysfunction, ICM euvolemic today Stable on an appropriate medical regimen Normal ICD function See Pace Art report No changes today  2. Renal insufficiency grade4 Most recent labs stable. Under-going pre surgical clearance as below.  3. Hypertension Running low today. No lightheadedness or dizziness on HF regimen. States his BP runs fine at home. Continue to follow.   4. R Hip Arthritis He has been cleared by Dr. Aundra Dubin. Holding ASA 2 days prior.   Current medicines are reviewed at length with the patient today.   The patient does not have concerns regarding his medicines.  The following changes were made today:  none  Labs/ tests ordered today include:  No orders of the defined types were placed in this encounter.  Disposition:   Follow up with Dr Caryl Comes annually.   Jacalyn Lefevre, PA-C  11/21/2018 10:33 AM  [facility] [address] [place] [address] [phone] (office)  [phone] (fax)

## 2018-11-22 NOTE — Progress Notes (Addendum)
11/21/2018-noted in [place]- office visit with Dr. Chalmers Cater, Cardiology and device check  11/01/2018- noted in [place] visit with Dr. Loralie Champagne  09/14/2018- noted in Epic-Virtual office visit  with Dr. Aundra Dubin and Cardiac Clearance  06/09/2018- noted in Epic-Stress test  06/08/2018- noted in Epic-ECHO  05/27/2018- noted in [place]

## 2018-11-22 NOTE — Patient Instructions (Addendum)
YOU NEED TO HAVE A COVID 19 TEST ON_____Monday June 8th, 2020_______, THIS TEST MUST BE DONE BEFORE SURGERY, COME TO [place] ENTRANCE.                  [name] [name]    Your procedure is scheduled on: Wednesday 11/30/2018  Report to [facility] Main  Entrance              Report to Short Stay  at   [place] AM                  Call this number if you have problems the morning of surgery [phone]    Remember: Do not eat food  :After Midnight.    NO SOLID FOOD AFTER MIDNIGHT THE NIGHT PRIOR TO SURGERY. NOTHING BY MOUTH EXCEPT CLEAR LIQUIDS UNTIL 0430 am.   PLEASE FINISH Gatorade G 2  DRINK PER SURGEON ORDER  WHICH NEEDS TO BE COMPLETED AT  0430 am.   CLEAR LIQUID DIET   Foods Allowed                                                                     Foods Excluded  Coffee and tea, regular and decaf                             liquids that you cannot  Plain Jell-O in any flavor                                             see through such as: Fruit ices (not with fruit pulp)                                     milk, soups, orange juice  Iced Popsicles                                    All solid food Carbonated beverages, regular and diet                                    Cranberry, grape and apple juices Sports drinks like Gatorade Lightly seasoned clear broth or consume(fat free) Sugar, honey syrup  Sample Menu Breakfast                                Lunch                                     Supper Cranberry juice                    Beef broth  Chicken broth Jell-O                                     Grape juice                           Apple juice Coffee or tea                        Jell-O                                      Popsicle                                                Coffee or tea                        Coffee or  tea  _____________________________________________________________________               BRUSH YOUR TEETH MORNING OF SURGERY AND RINSE YOUR MOUTH OUT, NO CHEWING GUM CANDY OR MINTS.    How to Manage Your Diabetes Before and After Surgery  Why is it important to control my blood sugar before and after surgery? . Improving blood sugar levels before and after surgery helps healing and can limit problems. . A way of improving blood sugar control is eating a healthy diet by: o  Eating less sugar and carbohydrates o  Increasing activity/exercise o  Talking with your doctor about reaching your blood sugar goals . High blood sugars (greater than 180 mg/dL) can raise your risk of infections and slow your recovery, so you will need to focus on controlling your diabetes during the weeks before surgery. . Make sure that the doctor who takes care of your diabetes knows about your planned surgery including the date and location.  How do I manage my blood sugar before surgery? . Check your blood sugar at least 4 times a day, starting 2 days before surgery, to make sure that the level is not too high or low. o Check your blood sugar the morning of your surgery when you wake up and every 2 hours until you get to the Short Stay unit. . If your blood sugar is less than 70 mg/dL, you will need to treat for low blood sugar: o Do not take insulin. o Treat a low blood sugar (less than 70 mg/dL) with  cup of clear juice (cranberry or apple), 4 glucose tablets, OR glucose gel. o Recheck blood sugar in 15 minutes after treatment (to make sure it is greater than 70 mg/dL). If your blood sugar is not greater than 70 mg/dL on recheck, call [phone] for further instructions. . Report your blood sugar to the short stay nurse when you get to Short Stay.  . If you are admitted to the hospital after surgery: o Your blood sugar will be checked by the staff and you will probably be given insulin after surgery  (instead of oral diabetes medicines) to make sure you have good blood sugar levels. o The goal for blood sugar control after surgery is 80-180 mg/dL.  WHAT DO I DO ABOUT MY DIABETES MEDICATION?  Marland Kitchen Do not take oral diabetes medicines (pills) the morning of surgery.!           Take these medicines the morning of surgery with A SIP OF WATER: Omeprazole (Prilosec), use Spiriva inhaler and use Albuterol inhaler and bring inhalers with you to the hospital on the day of your surgery               DO NOT TAKE ANY DIABETIC MEDICATIONS DAY OF YOUR SURGERY!                               You may not have any metal on your body including hair pins and              piercings  Do not wear jewelry, make-up, lotions, powders or perfumes, deodorant                         Men may shave face and neck.   Do not bring valuables to the hospital. [place].  Contacts, dentures or bridgework may not be worn into surgery.  Leave suitcase in the car. After surgery it may be brought to your room.                  Please read over the following fact sheets you were given: _____________________________________________________________________             [facility] - Preparing for Surgery Before surgery, you can play an important role.  Because skin is not sterile, your skin needs to be as free of germs as possible.  You can reduce the number of germs on your skin by washing with CHG (chlorahexidine gluconate) soap before surgery.  CHG is an antiseptic cleaner which kills germs and bonds with the skin to continue killing germs even after washing. Please DO NOT use if you have an allergy to CHG or antibacterial soaps.  If your skin becomes reddened/irritated stop using the CHG and inform your nurse when you arrive at Short Stay. Do not shave (including legs and underarms) for at least 48 hours prior to the first CHG shower.  You may shave your  face/neck. Please follow these instructions carefully:  1.  Shower with CHG Soap the night before surgery and the  morning of Surgery.  2.  If you choose to wash your hair, wash your hair first as usual with your  normal  shampoo.  3.  After you shampoo, rinse your hair and body thoroughly to remove the  shampoo.                           4.  Use CHG as you would any other liquid soap.  You can apply chg directly  to the skin and wash                       Gently with a scrungie or clean washcloth.  5.  Apply the CHG Soap to your body ONLY FROM THE NECK DOWN.   Do not use on face/ open  Wound or open sores. Avoid contact with eyes, ears mouth and genitals (private parts).                       Wash face,  Genitals (private parts) with your normal soap.             6.  Wash thoroughly, paying special attention to the area where your surgery  will be performed.  7.  Thoroughly rinse your body with warm water from the neck down.  8.  DO NOT shower/wash with your normal soap after using and rinsing off  the CHG Soap.                9.  Pat yourself dry with a clean towel.            10.  Wear clean pajamas.            11.  Place clean sheets on your bed the night of your first shower and do not  sleep with pets. Day of Surgery : Do not apply any lotions/deodorants the morning of surgery.  Please wear clean clothes to the hospital/surgery center.  FAILURE TO FOLLOW THESE INSTRUCTIONS MAY RESULT IN THE CANCELLATION OF YOUR SURGERY PATIENT SIGNATURE_________________________________  NURSE SIGNATURE__________________________________  ________________________________________________________________________   [name] [name]  An incentive spirometer is a tool that can help keep your lungs clear and active. This tool measures how well you are filling your lungs with each breath. Taking long deep breaths may help reverse or decrease the chance of developing breathing  (pulmonary) problems (especially infection) following:  A long period of time when you are unable to move or be active. BEFORE THE PROCEDURE   If the spirometer includes an indicator to show your best effort, your nurse or respiratory therapist will set it to a desired goal.  If possible, sit up straight or lean slightly forward. Try not to slouch.  Hold the incentive spirometer in an upright position. INSTRUCTIONS FOR USE  1. Sit on the edge of your bed if possible, or sit up as far as you can in bed or on a chair. 2. Hold the incentive spirometer in an upright position. 3. Breathe out normally. 4. Place the mouthpiece in your mouth and seal your lips tightly around it. 5. Breathe in slowly and as deeply as possible, raising the piston or the ball toward the top of the column. 6. Hold your breath for 3-5 seconds or for as long as possible. Allow the piston or ball to fall to the bottom of the column. 7. Remove the mouthpiece from your mouth and breathe out normally. 8. Rest for a few seconds and repeat Steps 1 through 7 at least 10 times every 1-2 hours when you are awake. Take your time and take a few normal breaths between deep breaths. 9. The spirometer may include an indicator to show your best effort. Use the indicator as a goal to work toward during each repetition. 10. After each set of 10 deep breaths, practice coughing to be sure your lungs are clear. If you have an incision (the cut made at the time of surgery), support your incision when coughing by placing a pillow or rolled up towels firmly against it. Once you are able to get out of bed, walk around indoors and cough well. You may stop using the incentive spirometer when instructed by your caregiver.  RISKS AND COMPLICATIONS  Take your time so you do not get  dizzy or light-headed.  If you are in pain, you may need to take or ask for pain medication before doing incentive spirometry. It is harder to take a deep breath if you  are having pain. AFTER USE  Rest and breathe slowly and easily.  It can be helpful to keep track of a log of your progress. Your caregiver can provide you with a simple table to help with this. If you are using the spirometer at home, follow these instructions: [place] IF:   You are having difficultly using the spirometer.  You have trouble using the spirometer as often as instructed.  Your pain medication is not giving enough relief while using the spirometer.  You develop fever of 100.5 F (38.1 C) or higher. SEEK IMMEDIATE MEDICAL CARE IF:   You cough up bloody sputum that had not been present before.  You develop fever of 102 F (38.9 C) or greater.  You develop worsening pain at or near the incision site. MAKE SURE YOU:   Understand these instructions.  Will watch your condition.  Will get help right away if you are not doing well or get worse. Document Released: 10/19/2006 Document Revised: 08/31/2011 Document Reviewed: 12/20/2006 [employer] Patient Information 2014 [employer], [place].   ________________________________________________________________________  WHAT IS A BLOOD TRANSFUSION? Blood Transfusion Information  A transfusion is the replacement of blood or some of its parts. Blood is made up of multiple cells which provide different functions.  Red blood cells carry oxygen and are used for blood loss replacement.  White blood cells fight against infection.  Platelets control bleeding.  Plasma helps clot blood.  Other blood products are available for specialized needs, such as hemophilia or other clotting disorders. BEFORE THE TRANSFUSION  Who gives blood for transfusions?   Healthy volunteers who are fully evaluated to make sure their blood is safe. This is blood bank blood. Transfusion therapy is the safest it has ever been in the practice of medicine. Before blood is taken from a donor, a complete history is taken to make sure that person has  no history of diseases nor engages in risky social behavior (examples are intravenous drug use or sexual activity with multiple partners). The donor's travel history is screened to minimize risk of transmitting infections, such as malaria. The donated blood is tested for signs of infectious diseases, such as HIV and hepatitis. The blood is then tested to be sure it is compatible with you in order to minimize the chance of a transfusion reaction. If you or a relative donates blood, this is often done in anticipation of surgery and is not appropriate for emergency situations. It takes many days to process the donated blood. RISKS AND COMPLICATIONS Although transfusion therapy is very safe and saves many lives, the main dangers of transfusion include:   Getting an infectious disease.  Developing a transfusion reaction. This is an allergic reaction to something in the blood you were given. Every precaution is taken to prevent this. The decision to have a blood transfusion has been considered carefully by your caregiver before blood is given. Blood is not given unless the benefits outweigh the risks. AFTER THE TRANSFUSION  Right after receiving a blood transfusion, you will usually feel much better and more energetic. This is especially true if your red blood cells have gotten low (anemic). The transfusion raises the level of the red blood cells which carry oxygen, and this usually causes an energy increase.  The nurse administering the transfusion will  monitor you carefully for complications. HOME CARE INSTRUCTIONS  No special instructions are needed after a transfusion. You may find your energy is better. Speak with your caregiver about any limitations on activity for underlying diseases you may have. SEEK MEDICAL CARE IF:   Your condition is not improving after your transfusion.  You develop redness or irritation at the intravenous (IV) site. SEEK IMMEDIATE MEDICAL CARE IF:  Any of the following  symptoms occur over the next 12 hours:  Shaking chills.  You have a temperature by mouth above 102 F (38.9 C), not controlled by medicine.  Chest, back, or muscle pain.  People around you feel you are not acting correctly or are confused.  Shortness of breath or difficulty breathing.  Dizziness and fainting.  You get a rash or develop hives.  You have a decrease in urine output.  Your urine turns a dark color or changes to pink, red, or brown. Any of the following symptoms occur over the next 10 days:  You have a temperature by mouth above 102 F (38.9 C), not controlled by medicine.  Shortness of breath.  Weakness after normal activity.  The white part of the eye turns yellow (jaundice).  You have a decrease in the amount of urine or are urinating less often.  Your urine turns a dark color or changes to pink, red, or brown. Document Released: 06/05/2000 Document Revised: 08/31/2011 Document Reviewed: 01/23/2008 [facility] Patient Information 2014 [place], [place].  _______________________________________________________________________

## 2018-11-23 ENCOUNTER — Encounter (HOSPITAL_COMMUNITY)
Admission: RE | Admit: 2018-11-23 | Discharge: 2018-11-23 | Disposition: A | Discharge status: Home / Self Care | Payer: Medicare HMO | Source: Ambulatory Visit | Attending: Orthopedic Surgery | Admitting: Orthopedic Surgery

## 2018-11-23 ENCOUNTER — Encounter (HOSPITAL_COMMUNITY): Payer: Self-pay

## 2018-11-23 ENCOUNTER — Other Ambulatory Visit: Payer: Self-pay

## 2018-11-23 DIAGNOSIS — Z01812 Encounter for preprocedural laboratory examination: Secondary | ICD-10-CM | POA: Diagnosis not present

## 2018-11-23 DIAGNOSIS — R32 Unspecified urinary incontinence: Secondary | ICD-10-CM | POA: Diagnosis not present

## 2018-11-23 HISTORY — DX: Presence of automatic (implantable) cardiac defibrillator: Z95.810

## 2018-11-23 LAB — HEMOGLOBIN A1C
Hgb A1c MFr Bld: 5.9 % — ABNORMAL HIGH (ref 4.8–5.6)
Mean Plasma Glucose: 122.63 mg/dL

## 2018-11-23 LAB — BASIC METABOLIC PANEL
Anion gap: 7 (ref 5–15)
BUN: 48 mg/dL — ABNORMAL HIGH (ref 8–23)
CO2: 21 mmol/L — ABNORMAL LOW (ref 22–32)
Calcium: 9.3 mg/dL (ref 8.9–10.3)
Chloride: 108 mmol/L (ref 98–111)
Creatinine, Ser: 2.32 mg/dL — ABNORMAL HIGH (ref 0.61–1.24)
GFR calc Af Amer: 32 mL/min — ABNORMAL LOW (ref >60)
GFR calc non Af Amer: 28 mL/min — ABNORMAL LOW (ref >60)
Glucose, Bld: 86 mg/dL (ref 70–99)
Potassium: 4.1 mmol/L (ref 3.5–5.1)
Sodium: 136 mmol/L (ref 135–145)

## 2018-11-23 LAB — CBC
HCT: 41.8 % (ref 39.0–52.0)
Hemoglobin: 13.1 g/dL (ref 13.0–17.0)
MCH: 29.8 pg (ref 26.0–34.0)
MCHC: 31.3 g/dL (ref 30.0–36.0)
MCV: 95.2 fL (ref 80.0–100.0)
Platelets: 163 10*3/uL (ref 150–400)
RBC: 4.39 MIL/uL (ref 4.22–5.81)
RDW: 15.3 % (ref 11.5–15.5)
WBC: 8.7 10*3/uL (ref 4.0–10.5)
nRBC: 0 % (ref 0.0–0.2)

## 2018-11-23 LAB — URINALYSIS, ROUTINE W REFLEX MICROSCOPIC
Bilirubin Urine: NEGATIVE
Glucose, UA: NEGATIVE mg/dL
Ketones, ur: NEGATIVE mg/dL
Nitrite: NEGATIVE
Protein, ur: NEGATIVE mg/dL
Specific Gravity, Urine: 1.011 (ref 1.005–1.030)
pH: 5 (ref 5.0–8.0)

## 2018-11-23 LAB — PROTIME-INR
INR: 1 (ref 0.8–1.2)
Prothrombin Time: 13.3 seconds (ref 11.4–15.2)

## 2018-11-23 LAB — APTT: aPTT: 31 seconds (ref 24–36)

## 2018-11-23 LAB — SURGICAL PCR SCREEN
MRSA, PCR: NEGATIVE
Staphylococcus aureus: NEGATIVE

## 2018-11-23 LAB — ABO/RH: ABO/RH(D): O POS

## 2018-11-23 LAB — GLUCOSE, CAPILLARY: Glucose-Capillary: 95 mg/dL (ref 70–99)

## 2018-11-23 NOTE — Progress Notes (Signed)
Pre-op appt nurse note:   Pt presents with his sister [name] [name] [his stated HCPOA]. Pt denies any acute cardiac symptoms . Pt denies any shocks from his defibrillator. Vitals WDL.

## 2018-11-24 NOTE — Anesthesia Preprocedure Evaluation (Addendum)
Anesthesia Evaluation  Patient identified by MRN, date of birth, ID band Patient awake    Reviewed: Allergy & Precautions, NPO status , Patient's Chart, lab work & pertinent test results  History of Anesthesia Complications Negative for: history of anesthetic complications  Airway Mallampati: II  TM Distance: >3 FB Neck ROM: Full    Dental   Pulmonary COPD, Current Smoker,    Pulmonary exam normal        Cardiovascular Exercise Tolerance: Good hypertension, + CAD, + CABG, + Peripheral Vascular Disease and +CHF (EF 35%)  Normal cardiovascular exam+ Cardiac Defibrillator   Pt states he can walk a half mile without having to stop and catch his breath   Neuro/Psych negative neurological ROS  negative psych ROS   GI/Hepatic Neg liver ROS, GERD  ,  Endo/Other  diabetes  Renal/GU Renal InsufficiencyRenal disease  negative genitourinary   Musculoskeletal  (+) Arthritis ,   Abdominal   Peds  Hematology negative hematology ROS (+)   Anesthesia Other Findings "Ricki Clack was last seen on 07/13/18 by Dr. Aundra Dubin. Dr. Aundra Dubin medically cleared him for surgery at that visit. Per his communication, patient may hold ASA for 2 days prior to procedure. Therefore, based on ACC/AHA guidelines, the patient would be at acceptable risk for the planned procedure without further cardiovascular testing."  Reproductive/Obstetrics                          Anesthesia Physical Anesthesia Plan  ASA: IV  Anesthesia Plan: Spinal   Post-op Pain Management:    Induction:   PONV Risk Score and Plan: 0 and Propofol infusion and Treatment may vary due to age or medical condition  Airway Management Planned: Natural Airway and Simple Face Mask  Additional Equipment: None  Intra-op Plan:   Post-operative Plan:   Informed Consent: I have reviewed the patients History and Physical, chart, labs and discussed the procedure  including the risks, benefits and alternatives for the proposed anesthesia with the patient or authorized representative who has indicated his/her understanding and acceptance.       Plan Discussed with:   Anesthesia Plan Comments: (See PAT note 11/23/2018, Konrad Felix, PA-C)      Anesthesia Quick Evaluation

## 2018-11-24 NOTE — Progress Notes (Signed)
Anesthesia Chart Review   Case:  [id number] Date/Time:  11/30/18 0815   Procedure:  TOTAL HIP ARTHROPLASTY ANTERIOR APPROACH (Right )   Anesthesia type:  Spinal   Pre-op diagnosis:  avascular necrosis of bone of right hip   Location:  [facility] ROOM 07 / [facility]   Surgeon:  Rod Can, MD      DISCUSSION: 68 yo current every day smoker (30 pack years) with h/o COPD, HLD, CHF, PVD, DM II, CKD Stage IV, HTN, CAD (s/p CABG x 5 2014), AICD placed 07/06/14, right hip AVN scheduled for above procedure 11/30/18 with Dr. Rod Can.    Pt cleared by cardiology 07/29/2018.  Per Fabian Sharp, PA-C, "Lukah Goswami was last seen on 07/13/18 by Dr. Aundra Dubin. Dr. Aundra Dubin medically cleared him for surgery at that visit. Per his communication, patient may hold ASA for 2 days prior to procedure. Therefore, based on ACC/AHA guidelines, the patient would be at acceptable risk for the planned procedure without further cardiovascular testing."  He has since been seen by cardiology and electrophysiology, stable at these appts. Estimated ejection fraction was 35% on Echo 12/19.  CKD followed by PCP, creatinine relatively stable.  VS: BP 122/69 (BP Location: Left Arm)   Pulse 77   Temp 36.9 C (Oral)   Resp 18   Ht 5\' 7"  (1.702 m)   Wt 95.7 kg   SpO2 99%   BMI 33.05 kg/m   PROVIDERS: [name], PA-C  Is PCP   Loralie Champagne, MD is Cardiologist   [name], MD is Electrophysiologist  LABS: Labs reviewed: Acceptable for surgery. (all labs ordered are listed, but only abnormal results are displayed)  Labs Reviewed  HEMOGLOBIN A1C - Abnormal; Notable for the following components:      Result Value   Hgb A1c MFr Bld 5.9 (*)    All other components within normal limits  BASIC METABOLIC PANEL - Abnormal; Notable for the following components:   CO2 21 (*)    BUN 48 (*)    Creatinine, Ser 2.32 (*)    GFR calc non Af Amer 28 (*)    GFR calc Af Amer 32 (*)    All other components within normal limits   URINALYSIS, ROUTINE W REFLEX MICROSCOPIC - Abnormal; Notable for the following components:   Hgb urine dipstick SMALL (*)    Leukocytes,Ua MODERATE (*)    Bacteria, UA FEW (*)    All other components within normal limits  SURGICAL PCR SCREEN  GLUCOSE, CAPILLARY  APTT  CBC  PROTIME-INR  TYPE AND SCREEN  ABO/RH     IMAGES:   EKG: 05/27/18 Rate 71 bpm Sinus rhythm with occasional Premature ventricular complexes Right bundle branch block Left anterior fascicular block Bifascicular block  Inferior infarct , age undetermined Abnormal ECG Since last tracing Premature ventricular complexes are now Present Criteria for Inferior infarct are now Present   CV: Stress Test 06/09/2018  Nuclear stress EF: 29%.  There was no ST segment deviation noted during stress.  This is a high risk study.  The left ventricular ejection fraction is severely decreased (<30%).   1. EF 29%, diffuse hypokinesis.  2. Fixed medium-sized, mild basal to mid inferior perfusion defect.  No evidence for ischemia, cannot rule out area of prior infarction.   High risk study due to low EF.    Echo 06/08/18 Study Conclusions  - Left ventricle: The cavity size was normal. Wall thickness was   increased in a pattern of mild LVH.  Basal to mid inferolateral   severe hypokinesis. Basal to mid inferior akinesis. The estimated   ejection fraction was 35%. Doppler parameters are consistent with   abnormal left ventricular relaxation (grade 1 diastolic   dysfunction). - Aortic valve: There was no stenosis. - Mitral valve: Mildly calcified annulus. There was mild   regurgitation. - Left atrium: The atrium was mildly dilated. - Right ventricle: The cavity size was normal. Pacer wire or   catheter noted in right ventricle. Systolic function was mildly   reduced. - Tricuspid valve: Peak RV-RA gradient (S): 26 mm Hg. - Pulmonary arteries: PA peak pressure: 29 mm Hg (S). - Inferior vena cava: The vessel  was normal in size. The   respirophasic diameter changes were in the normal range (>= 50%),   consistent with normal central venous pressure.  Impressions:  - Normal LV size with mild LV hypertrophy. EF 35% with wall motion   abnormalities as noted above. Normal RV size with mildly   decreased systolic function. Mild MR.  Past Medical History:  Diagnosis Date  . AICD (automatic cardioverter/defibrillator) present    reports no shocks   . CAD (coronary artery disease)    a. 04/2013 Cath: LM 10, LAD 40p, 73m, D1 50p, LCX 60p, 60m/100m, OM1 50, OM2 100 L-L collats, RCA 100p;  b. CABG x 5: LIMA->LAD, VG->D2, VG->RI->OM2, VG->PDA.  Marland Kitchen Chronic systolic CHF (congestive heart failure) ([place])    a. 04/2013 Echo: EF 15-20%, diff HK, sev HK of inf and ant myocardium, dilated LA,  b. EF 20-25% with restrictive filling pattern, mod RV dysfx, mild MR (10/10/2013);  c.  Echo 12/15: EF 25-30%  . COPD (chronic obstructive pulmonary disease) ([place])   . Diabetes mellitus without complication ([place])    unsure what type but was dx as an adult, takes no meds, check his cbg at home 2 times a week   . Emphysema   . Gout   . Hyperlipidemia   . Hypertension   . Ischemic cardiomyopathy   . Peripheral neuropathy    reprots bilateral lower extemity paresthesia   . PVD (peripheral vascular disease) ([place])    a. (10/2013) ABIs: RIGHT 0.63, Waveforms: monophasic;  LEFT 0.78, Waveforms: monophasic  . Tobacco abuse    30+ pack-year history  . Transaminitis    a. 04/2013 Abd U/S and CT unremarkable, hepatitis panel neg -->felt to be 2/2 acute R heart failure.    Past Surgical History:  Procedure Laterality Date  . APPENDECTOMY  as a child  . CARDIAC CATHETERIZATION  04/08/2015   Procedure: Left Heart Cath and Cors/Grafts Angiography;  Surgeon: Peter M Martinique, MD;  Location: [facility];  Service: Cardiovascular;;  . CORONARY ARTERY BYPASS GRAFT N/A 05/05/2013   Procedure: CORONARY ARTERY BYPASS GRAFTING  (CABG) TIMES FIVE USING LEFT INTERNAL MAMMARY ARTERY AND RIGHT AND LEFT SAPHENOUS LEG VEIN HARVESTED ENDOSCOPICALLY;  Surgeon: Melrose Nakayama, MD;  Location: [facility];  Service: Open Heart Surgery;  Laterality: N/A;  . IMPLANTABLE CARDIOVERTER DEFIBRILLATOR IMPLANT N/A 07/06/2014   Procedure: IMPLANTABLE CARDIOVERTER DEFIBRILLATOR IMPLANT;  Surgeon: Deboraha Sprang, MD;  Location: [facility] CATH LAB;  Service: Cardiovascular;  Laterality: N/A;  . LEFT AND RIGHT HEART CATHETERIZATION WITH CORONARY ANGIOGRAM N/A 04/28/2013   Procedure: LEFT AND RIGHT HEART CATHETERIZATION WITH CORONARY ANGIOGRAM;  Surgeon: Burnell Blanks, MD;  Location: [facility] CATH LAB;  Service: Cardiovascular;  Laterality: N/A;  . MULTIPLE EXTRACTIONS WITH ALVEOLOPLASTY N/A 05/03/2013   Procedure: Extraction of tooth #s  [phone] with alveoloplast and maxillary left osseous tuberosity reduction;  Surgeon: Lenn Cal, DDS;  Location: [facility];  Service: Oral Surgery;  Laterality: N/A;    MEDICATIONS: . albuterol (PROVENTIL HFA;VENTOLIN HFA) 108 (90 BASE) MCG/ACT inhaler  . allopurinol (ZYLOPRIM) 300 MG tablet  . aspirin EC 81 MG tablet  . atorvastatin (LIPITOR) 20 MG tablet  . azelastine (OPTIVAR) 0.05 % ophthalmic solution  . b complex vitamins tablet  . budesonide-formoterol (SYMBICORT) 80-4.5 MCG/ACT inhaler  . celecoxib (CELEBREX) 100 MG capsule  . colchicine (COLCRYS) 0.6 MG tablet  . furosemide (LASIX) 40 MG tablet  . ibuprofen (ADVIL,MOTRIN) 800 MG tablet  . omeprazole (PRILOSEC) 20 MG capsule  . sacubitril-valsartan (ENTRESTO) 24-26 MG  . spironolactone (ALDACTONE) 25 MG tablet  . tiotropium (SPIRIVA) 18 MCG inhalation capsule  . traMADol (ULTRAM) 50 MG tablet   No current facility-administered medications for this encounter.     Maia Plan [facility] Pre-Surgical Testing [phone] 11/24/18 3:21 PM

## 2018-11-28 ENCOUNTER — Other Ambulatory Visit (HOSPITAL_COMMUNITY)
Admission: RE | Admit: 2018-11-28 | Discharge: 2018-11-28 | Disposition: A | Discharge status: Home / Self Care | Payer: Medicare HMO | Source: Ambulatory Visit | Attending: Orthopedic Surgery | Admitting: Orthopedic Surgery

## 2018-11-28 DIAGNOSIS — Z1159 Encounter for screening for other viral diseases: Secondary | ICD-10-CM | POA: Diagnosis not present

## 2018-11-28 LAB — SARS CORONAVIRUS 2 BY RT PCR (HOSPITAL ORDER, PERFORMED IN ~~LOC~~ HOSPITAL LAB): SARS Coronavirus 2: NEGATIVE

## 2018-11-29 NOTE — Progress Notes (Signed)
SPOKE W/  Voicemail left for patient to return call     SCREENING SYMPTOMS OF COVID 19:   COUGH--  RUNNY NOSE---   SORE THROAT---  NASAL CONGESTION----  SNEEZING----  SHORTNESS OF BREATH---  DIFFICULTY BREATHING---  TEMP >100.0 -----  UNEXPLAINED BODY ACHES------  CHILLS --------   HEADACHES ---------  LOSS OF SMELL/ TASTE --------    HAVE YOU OR ANY FAMILY MEMBER TRAVELLED PAST 14 DAYS OUT OF THE   COUNTY--- STATE---- COUNTRY----  HAVE YOU OR ANY FAMILY MEMBER BEEN EXPOSED TO ANYONE WITH COVID 19?

## 2018-11-30 ENCOUNTER — Encounter (HOSPITAL_COMMUNITY): Payer: Self-pay | Admitting: *Deleted

## 2018-11-30 ENCOUNTER — Observation Stay (HOSPITAL_COMMUNITY)
Admission: RE | Admit: 2018-11-30 | Discharge: 2018-12-01 | Disposition: A | Discharge status: Home / Self Care | Payer: Medicare HMO | Attending: Orthopedic Surgery | Admitting: Orthopedic Surgery

## 2018-11-30 ENCOUNTER — Encounter (HOSPITAL_COMMUNITY)
Admission: RE | Disposition: A | Discharge status: Home / Self Care | Payer: Self-pay | Source: Home / Self Care | Attending: Orthopedic Surgery

## 2018-11-30 ENCOUNTER — Ambulatory Visit (HOSPITAL_COMMUNITY): Payer: Medicare HMO | Admitting: Physician Assistant

## 2018-11-30 ENCOUNTER — Ambulatory Visit (HOSPITAL_COMMUNITY): Payer: Medicare HMO

## 2018-11-30 ENCOUNTER — Ambulatory Visit (HOSPITAL_COMMUNITY): Payer: Medicare HMO | Admitting: Anesthesiology

## 2018-11-30 ENCOUNTER — Other Ambulatory Visit: Payer: Self-pay

## 2018-11-30 ENCOUNTER — Inpatient Hospital Stay (HOSPITAL_COMMUNITY): Payer: Medicare HMO

## 2018-11-30 DIAGNOSIS — E119 Type 2 diabetes mellitus without complications: Secondary | ICD-10-CM | POA: Insufficient documentation

## 2018-11-30 DIAGNOSIS — Z95810 Presence of automatic (implantable) cardiac defibrillator: Secondary | ICD-10-CM | POA: Insufficient documentation

## 2018-11-30 DIAGNOSIS — M87051 Idiopathic aseptic necrosis of right femur: Secondary | ICD-10-CM | POA: Diagnosis not present

## 2018-11-30 DIAGNOSIS — I255 Ischemic cardiomyopathy: Secondary | ICD-10-CM | POA: Diagnosis not present

## 2018-11-30 DIAGNOSIS — I70219 Atherosclerosis of native arteries of extremities with intermittent claudication, unspecified extremity: Secondary | ICD-10-CM | POA: Diagnosis not present

## 2018-11-30 DIAGNOSIS — M879 Osteonecrosis, unspecified: Secondary | ICD-10-CM | POA: Diagnosis not present

## 2018-11-30 DIAGNOSIS — Z09 Encounter for follow-up examination after completed treatment for conditions other than malignant neoplasm: Secondary | ICD-10-CM

## 2018-11-30 DIAGNOSIS — I4891 Unspecified atrial fibrillation: Secondary | ICD-10-CM | POA: Diagnosis not present

## 2018-11-30 DIAGNOSIS — R531 Weakness: Secondary | ICD-10-CM | POA: Insufficient documentation

## 2018-11-30 DIAGNOSIS — M109 Gout, unspecified: Secondary | ICD-10-CM | POA: Diagnosis not present

## 2018-11-30 DIAGNOSIS — Z8249 Family history of ischemic heart disease and other diseases of the circulatory system: Secondary | ICD-10-CM | POA: Insufficient documentation

## 2018-11-30 DIAGNOSIS — J449 Chronic obstructive pulmonary disease, unspecified: Secondary | ICD-10-CM | POA: Insufficient documentation

## 2018-11-30 DIAGNOSIS — E669 Obesity, unspecified: Secondary | ICD-10-CM | POA: Insufficient documentation

## 2018-11-30 DIAGNOSIS — Z7982 Long term (current) use of aspirin: Secondary | ICD-10-CM | POA: Diagnosis not present

## 2018-11-30 DIAGNOSIS — Z7951 Long term (current) use of inhaled steroids: Secondary | ICD-10-CM | POA: Diagnosis not present

## 2018-11-30 DIAGNOSIS — Z471 Aftercare following joint replacement surgery: Secondary | ICD-10-CM | POA: Diagnosis not present

## 2018-11-30 DIAGNOSIS — M87851 Other osteonecrosis, right femur: Secondary | ICD-10-CM | POA: Diagnosis not present

## 2018-11-30 DIAGNOSIS — Z419 Encounter for procedure for purposes other than remedying health state, unspecified: Secondary | ICD-10-CM

## 2018-11-30 DIAGNOSIS — Z951 Presence of aortocoronary bypass graft: Secondary | ICD-10-CM | POA: Insufficient documentation

## 2018-11-30 DIAGNOSIS — I2510 Atherosclerotic heart disease of native coronary artery without angina pectoris: Secondary | ICD-10-CM | POA: Diagnosis not present

## 2018-11-30 DIAGNOSIS — Z880 Allergy status to penicillin: Secondary | ICD-10-CM | POA: Insufficient documentation

## 2018-11-30 DIAGNOSIS — F17210 Nicotine dependence, cigarettes, uncomplicated: Secondary | ICD-10-CM | POA: Insufficient documentation

## 2018-11-30 DIAGNOSIS — E785 Hyperlipidemia, unspecified: Secondary | ICD-10-CM | POA: Insufficient documentation

## 2018-11-30 DIAGNOSIS — G629 Polyneuropathy, unspecified: Secondary | ICD-10-CM | POA: Insufficient documentation

## 2018-11-30 DIAGNOSIS — Z791 Long term (current) use of non-steroidal anti-inflammatories (NSAID): Secondary | ICD-10-CM | POA: Diagnosis not present

## 2018-11-30 DIAGNOSIS — R269 Unspecified abnormalities of gait and mobility: Secondary | ICD-10-CM | POA: Diagnosis not present

## 2018-11-30 DIAGNOSIS — Z96641 Presence of right artificial hip joint: Secondary | ICD-10-CM | POA: Diagnosis not present

## 2018-11-30 DIAGNOSIS — Z79899 Other long term (current) drug therapy: Secondary | ICD-10-CM | POA: Diagnosis not present

## 2018-11-30 DIAGNOSIS — I110 Hypertensive heart disease with heart failure: Secondary | ICD-10-CM | POA: Diagnosis not present

## 2018-11-30 DIAGNOSIS — Z6833 Body mass index (BMI) 33.0-33.9, adult: Secondary | ICD-10-CM | POA: Diagnosis not present

## 2018-11-30 DIAGNOSIS — I5023 Acute on chronic systolic (congestive) heart failure: Secondary | ICD-10-CM | POA: Insufficient documentation

## 2018-11-30 DIAGNOSIS — I5021 Acute systolic (congestive) heart failure: Secondary | ICD-10-CM | POA: Diagnosis not present

## 2018-11-30 DIAGNOSIS — Z833 Family history of diabetes mellitus: Secondary | ICD-10-CM | POA: Insufficient documentation

## 2018-11-30 HISTORY — PX: TOTAL HIP ARTHROPLASTY: SHX124

## 2018-11-30 LAB — TYPE AND SCREEN
ABO/RH(D): O POS
Antibody Screen: NEGATIVE

## 2018-11-30 LAB — GLUCOSE, CAPILLARY: Glucose-Capillary: 91 mg/dL (ref 70–99)

## 2018-11-30 SURGERY — ARTHROPLASTY, HIP, TOTAL, ANTERIOR APPROACH
Anesthesia: Spinal | Site: Hip | Laterality: Right

## 2018-11-30 MED ORDER — SACUBITRIL-VALSARTAN 24-26 MG PO TABS
1.0000 | ORAL_TABLET | Freq: Two times a day (BID) | ORAL | Status: DC
  Administered 2018-11-30: 1 via ORAL
  Filled 2018-11-30 (×2): qty 1

## 2018-11-30 MED ORDER — WATER FOR IRRIGATION, STERILE IR SOLN
Status: DC | PRN
  Administered 2018-11-30 (×2): 1000 mL

## 2018-11-30 MED ORDER — OXYCODONE HCL 5 MG/5ML PO SOLN: 5.0000 mg | Freq: Once | ORAL | Status: DC | PRN

## 2018-11-30 MED ORDER — PHENOL 1.4 % MT LIQD: 1.0000 | OROMUCOSAL | Status: DC | PRN

## 2018-11-30 MED ORDER — POLYVINYL ALCOHOL 1.4 % OP SOLN
1.0000 [drp] | OPHTHALMIC | Status: DC | PRN
  Administered 2018-11-30: 1 [drp] via OPHTHALMIC
  Filled 2018-11-30: qty 15

## 2018-11-30 MED ORDER — ALBUMIN HUMAN 5 % IV SOLN
12.5000 g | Freq: Once | INTRAVENOUS | Status: DC
  Administered 2018-11-30: 12.5 g via INTRAVENOUS

## 2018-11-30 MED ORDER — ALBUMIN HUMAN 5 % IV SOLN
INTRAVENOUS | Status: AC
  Administered 2018-11-30: 12.5 g via INTRAVENOUS
  Filled 2018-11-30: qty 250

## 2018-11-30 MED ORDER — PANTOPRAZOLE SODIUM 40 MG PO TBEC
40.0000 mg | DELAYED_RELEASE_TABLET | Freq: Every day | ORAL | Status: DC
  Administered 2018-12-01: 09:00:00 40 mg via ORAL
  Filled 2018-11-30: qty 1

## 2018-11-30 MED ORDER — ISOPROPYL ALCOHOL 70 % SOLN
Status: AC
  Filled 2018-11-30: qty 480

## 2018-11-30 MED ORDER — SODIUM CHLORIDE 0.9 % IR SOLN
Status: DC | PRN
  Administered 2018-11-30: 3000 mL
  Administered 2018-11-30: 1000 mL

## 2018-11-30 MED ORDER — SODIUM CHLORIDE 0.9 % IV SOLN
INTRAVENOUS | Status: DC
  Administered 2018-11-30 (×2): via INTRAVENOUS

## 2018-11-30 MED ORDER — PHENYLEPHRINE HCL-NACL 10-0.9 MG/250ML-% IV SOLN
INTRAVENOUS | Status: AC
  Administered 2018-11-30: 12:00:00 20 ug/min via INTRAVENOUS
  Filled 2018-11-30: qty 250

## 2018-11-30 MED ORDER — CHLORHEXIDINE GLUCONATE 4 % EX LIQD: 60.0000 mL | Freq: Once | CUTANEOUS | Status: DC

## 2018-11-30 MED ORDER — DOCUSATE SODIUM 100 MG PO CAPS
100.0000 mg | ORAL_CAPSULE | Freq: Two times a day (BID) | ORAL | Status: DC
  Administered 2018-11-30 – 2018-12-01 (×2): 100 mg via ORAL
  Filled 2018-11-30 (×2): qty 1

## 2018-11-30 MED ORDER — ASPIRIN 81 MG PO CHEW
81.0000 mg | CHEWABLE_TABLET | Freq: Two times a day (BID) | ORAL | Status: DC
  Administered 2018-11-30 – 2018-12-01 (×2): 81 mg via ORAL
  Filled 2018-11-30 (×2): qty 1

## 2018-11-30 MED ORDER — HYDROCODONE-ACETAMINOPHEN 5-325 MG PO TABS
1.0000 | ORAL_TABLET | ORAL | Status: DC | PRN
  Administered 2018-11-30: 2 via ORAL
  Administered 2018-12-01: 1 via ORAL
  Filled 2018-11-30: qty 1
  Filled 2018-11-30: qty 2

## 2018-11-30 MED ORDER — TIOTROPIUM BROMIDE MONOHYDRATE 18 MCG IN CAPS: 18.0000 ug | ORAL_CAPSULE | Freq: Every day | RESPIRATORY_TRACT | Status: DC

## 2018-11-30 MED ORDER — KETOTIFEN FUMARATE 0.025 % OP SOLN
1.0000 [drp] | Freq: Two times a day (BID) | OPHTHALMIC | Status: DC
  Administered 2018-11-30 – 2018-12-01 (×2): 1 [drp] via OPHTHALMIC
  Filled 2018-11-30: qty 5

## 2018-11-30 MED ORDER — ONDANSETRON HCL 4 MG/2ML IJ SOLN: 4.0000 mg | Freq: Four times a day (QID) | INTRAMUSCULAR | Status: DC | PRN

## 2018-11-30 MED ORDER — LIDOCAINE HCL (CARDIAC) PF 100 MG/5ML IV SOSY
PREFILLED_SYRINGE | INTRAVENOUS | Status: DC | PRN
  Administered 2018-11-30: 60 mg via INTRAVENOUS

## 2018-11-30 MED ORDER — PHENYLEPHRINE HCL-NACL 10-0.9 MG/250ML-% IV SOLN
0.0000 ug/min | INTRAVENOUS | Status: DC
  Administered 2018-11-30: 12:00:00 20 ug/min via INTRAVENOUS

## 2018-11-30 MED ORDER — ALBUTEROL SULFATE HFA 108 (90 BASE) MCG/ACT IN AERS: 1.0000 | INHALATION_SPRAY | Freq: Four times a day (QID) | RESPIRATORY_TRACT | Status: DC | PRN

## 2018-11-30 MED ORDER — ACETAMINOPHEN 10 MG/ML IV SOLN
1000.0000 mg | INTRAVENOUS | Status: AC
  Administered 2018-11-30: 1000 mg via INTRAVENOUS
  Filled 2018-11-30: qty 100

## 2018-11-30 MED ORDER — SODIUM CHLORIDE (PF) 0.9 % IJ SOLN
INTRAMUSCULAR | Status: AC
  Filled 2018-11-30: qty 50

## 2018-11-30 MED ORDER — ALBUMIN HUMAN 5 % IV SOLN
INTRAVENOUS | Status: AC
  Filled 2018-11-30: qty 250

## 2018-11-30 MED ORDER — ACETAMINOPHEN 325 MG PO TABS: 325.0000 mg | ORAL_TABLET | Freq: Four times a day (QID) | ORAL | Status: DC | PRN

## 2018-11-30 MED ORDER — SODIUM CHLORIDE (PF) 0.9 % IJ SOLN
INTRAMUSCULAR | Status: DC | PRN
  Administered 2018-11-30: 30 mL

## 2018-11-30 MED ORDER — TRANEXAMIC ACID-NACL 1000-0.7 MG/100ML-% IV SOLN
1000.0000 mg | INTRAVENOUS | Status: AC
  Administered 2018-11-30: 1000 mg via INTRAVENOUS
  Filled 2018-11-30: qty 100

## 2018-11-30 MED ORDER — CELECOXIB 200 MG PO CAPS
200.0000 mg | ORAL_CAPSULE | Freq: Two times a day (BID) | ORAL | Status: DC
  Administered 2018-11-30 – 2018-12-01 (×2): 200 mg via ORAL
  Filled 2018-11-30 (×2): qty 1

## 2018-11-30 MED ORDER — COLCHICINE 0.6 MG PO TABS: 0.6000 mg | ORAL_TABLET | Freq: Every day | ORAL | Status: DC | PRN

## 2018-11-30 MED ORDER — ALBUMIN HUMAN 5 % IV SOLN
12.5000 g | Freq: Once | INTRAVENOUS | Status: AC
  Administered 2018-11-30: 12:00:00 12.5 g via INTRAVENOUS

## 2018-11-30 MED ORDER — HYDROCODONE-ACETAMINOPHEN 7.5-325 MG PO TABS: 1.0000 | ORAL_TABLET | ORAL | Status: DC | PRN

## 2018-11-30 MED ORDER — POVIDONE-IODINE 10 % EX SWAB
2.0000 "application " | Freq: Once | CUTANEOUS | Status: AC
  Administered 2018-11-30: 2 via TOPICAL

## 2018-11-30 MED ORDER — BUPIVACAINE-EPINEPHRINE 0.25% -1:200000 IJ SOLN
INTRAMUSCULAR | Status: DC | PRN
  Administered 2018-11-30: 30 mL

## 2018-11-30 MED ORDER — KETOROLAC TROMETHAMINE 30 MG/ML IJ SOLN
INTRAMUSCULAR | Status: AC
  Filled 2018-11-30: qty 1

## 2018-11-30 MED ORDER — LACTATED RINGERS IV SOLN: INTRAVENOUS | Status: DC

## 2018-11-30 MED ORDER — POLYETHYLENE GLYCOL 3350 17 G PO PACK: 17.0000 g | PACK | Freq: Every day | ORAL | Status: DC | PRN

## 2018-11-30 MED ORDER — MOMETASONE FURO-FORMOTEROL FUM 100-5 MCG/ACT IN AERO
2.0000 | INHALATION_SPRAY | Freq: Two times a day (BID) | RESPIRATORY_TRACT | Status: DC
  Filled 2018-11-30: qty 8.8

## 2018-11-30 MED ORDER — PHENYLEPHRINE HCL (PRESSORS) 10 MG/ML IV SOLN
INTRAVENOUS | Status: AC
  Filled 2018-11-30: qty 1

## 2018-11-30 MED ORDER — DIPHENHYDRAMINE HCL 12.5 MG/5ML PO ELIX: 12.5000 mg | ORAL_SOLUTION | ORAL | Status: DC | PRN

## 2018-11-30 MED ORDER — ONDANSETRON HCL 4 MG PO TABS: 4.0000 mg | ORAL_TABLET | Freq: Four times a day (QID) | ORAL | Status: DC | PRN

## 2018-11-30 MED ORDER — ONDANSETRON HCL 4 MG/2ML IJ SOLN: 4.0000 mg | Freq: Once | INTRAMUSCULAR | Status: DC | PRN

## 2018-11-30 MED ORDER — SODIUM CHLORIDE 0.9 % IV SOLN: INTRAVENOUS | Status: DC

## 2018-11-30 MED ORDER — POVIDONE-IODINE 10 % EX SWAB: 2.0000 "application " | Freq: Once | CUTANEOUS | Status: DC

## 2018-11-30 MED ORDER — PROPOFOL 500 MG/50ML IV EMUL
INTRAVENOUS | Status: DC | PRN
  Administered 2018-11-30: 100 ug/kg/min via INTRAVENOUS

## 2018-11-30 MED ORDER — METOCLOPRAMIDE HCL 5 MG PO TABS: 5.0000 mg | ORAL_TABLET | Freq: Three times a day (TID) | ORAL | Status: DC | PRN

## 2018-11-30 MED ORDER — SODIUM CHLORIDE 0.9 % IV SOLN
INTRAVENOUS | Status: DC | PRN
  Administered 2018-11-30: 09:00:00 50 ug/min via INTRAVENOUS

## 2018-11-30 MED ORDER — PROPOFOL 10 MG/ML IV BOLUS
INTRAVENOUS | Status: AC
  Filled 2018-11-30: qty 60

## 2018-11-30 MED ORDER — SPIRONOLACTONE 25 MG PO TABS
25.0000 mg | ORAL_TABLET | Freq: Every day | ORAL | Status: DC
  Administered 2018-11-30: 25 mg via ORAL
  Filled 2018-11-30 (×2): qty 1

## 2018-11-30 MED ORDER — ATORVASTATIN CALCIUM 20 MG PO TABS
20.0000 mg | ORAL_TABLET | Freq: Every day | ORAL | Status: DC
  Administered 2018-11-30 – 2018-12-01 (×2): 20 mg via ORAL
  Filled 2018-11-30 (×2): qty 1

## 2018-11-30 MED ORDER — SENNA 8.6 MG PO TABS
1.0000 | ORAL_TABLET | Freq: Two times a day (BID) | ORAL | Status: DC
  Administered 2018-11-30 – 2018-12-01 (×2): 8.6 mg via ORAL
  Filled 2018-11-30 (×2): qty 1

## 2018-11-30 MED ORDER — FENTANYL CITRATE (PF) 100 MCG/2ML IJ SOLN
INTRAMUSCULAR | Status: AC
  Filled 2018-11-30: qty 2

## 2018-11-30 MED ORDER — BUPIVACAINE HCL (PF) 0.5 % IJ SOLN
INTRAMUSCULAR | Status: DC | PRN
  Administered 2018-11-30: 3 mg

## 2018-11-30 MED ORDER — BUPIVACAINE HCL (PF) 0.5 % IJ SOLN
INTRAMUSCULAR | Status: AC
  Filled 2018-11-30: qty 30

## 2018-11-30 MED ORDER — ALBUTEROL SULFATE (2.5 MG/3ML) 0.083% IN NEBU: 2.5000 mg | INHALATION_SOLUTION | Freq: Four times a day (QID) | RESPIRATORY_TRACT | Status: DC | PRN

## 2018-11-30 MED ORDER — FENTANYL CITRATE (PF) 100 MCG/2ML IJ SOLN: 25.0000 ug | INTRAMUSCULAR | Status: DC | PRN

## 2018-11-30 MED ORDER — MORPHINE SULFATE (PF) 2 MG/ML IV SOLN: 0.5000 mg | INTRAVENOUS | Status: DC | PRN

## 2018-11-30 MED ORDER — ISOPROPYL ALCOHOL 70 % SOLN
Status: DC | PRN
  Administered 2018-11-30: 1 via TOPICAL

## 2018-11-30 MED ORDER — DEXAMETHASONE SODIUM PHOSPHATE 10 MG/ML IJ SOLN
10.0000 mg | Freq: Once | INTRAMUSCULAR | Status: AC
  Administered 2018-12-01: 10 mg via INTRAVENOUS
  Filled 2018-11-30: qty 1

## 2018-11-30 MED ORDER — MIDAZOLAM HCL 5 MG/5ML IJ SOLN
INTRAMUSCULAR | Status: DC | PRN
  Administered 2018-11-30: 2 mg via INTRAVENOUS

## 2018-11-30 MED ORDER — BUPIVACAINE-EPINEPHRINE (PF) 0.25% -1:200000 IJ SOLN
INTRAMUSCULAR | Status: AC
  Filled 2018-11-30: qty 30

## 2018-11-30 MED ORDER — FUROSEMIDE 40 MG PO TABS
40.0000 mg | ORAL_TABLET | Freq: Every day | ORAL | Status: DC
  Filled 2018-11-30: qty 1

## 2018-11-30 MED ORDER — ALLOPURINOL 300 MG PO TABS
300.0000 mg | ORAL_TABLET | Freq: Every day | ORAL | Status: DC
  Administered 2018-11-30 – 2018-12-01 (×2): 300 mg via ORAL
  Filled 2018-11-30 (×2): qty 1

## 2018-11-30 MED ORDER — VANCOMYCIN HCL IN DEXTROSE 1-5 GM/200ML-% IV SOLN
1000.0000 mg | INTRAVENOUS | Status: AC
  Administered 2018-11-30: 1000 mg via INTRAVENOUS
  Filled 2018-11-30: qty 200

## 2018-11-30 MED ORDER — PROPOFOL 10 MG/ML IV BOLUS
INTRAVENOUS | Status: AC
  Filled 2018-11-30: qty 40

## 2018-11-30 MED ORDER — LIDOCAINE 2% (20 MG/ML) 5 ML SYRINGE
INTRAMUSCULAR | Status: AC
  Filled 2018-11-30: qty 5

## 2018-11-30 MED ORDER — OXYCODONE HCL 5 MG PO TABS: 5.0000 mg | ORAL_TABLET | Freq: Once | ORAL | Status: DC | PRN

## 2018-11-30 MED ORDER — METHOCARBAMOL 500 MG PO TABS: 500.0000 mg | ORAL_TABLET | Freq: Four times a day (QID) | ORAL | Status: DC | PRN

## 2018-11-30 MED ORDER — METOCLOPRAMIDE HCL 5 MG/ML IJ SOLN: 5.0000 mg | Freq: Three times a day (TID) | INTRAMUSCULAR | Status: DC | PRN

## 2018-11-30 MED ORDER — KETOROLAC TROMETHAMINE 30 MG/ML IJ SOLN
INTRAMUSCULAR | Status: DC | PRN
  Administered 2018-11-30: 30 mg

## 2018-11-30 MED ORDER — ONDANSETRON HCL 4 MG/2ML IJ SOLN
INTRAMUSCULAR | Status: DC | PRN
  Administered 2018-11-30: 4 mg via INTRAVENOUS

## 2018-11-30 MED ORDER — ALUM & MAG HYDROXIDE-SIMETH 200-200-20 MG/5ML PO SUSP: 30.0000 mL | ORAL | Status: DC | PRN

## 2018-11-30 MED ORDER — DEXAMETHASONE SODIUM PHOSPHATE 10 MG/ML IJ SOLN
INTRAMUSCULAR | Status: AC
  Filled 2018-11-30: qty 1

## 2018-11-30 MED ORDER — SODIUM CHLORIDE 0.9 % IR SOLN
Status: DC | PRN
  Administered 2018-11-30: 1000 mL

## 2018-11-30 MED ORDER — PROPOFOL 10 MG/ML IV BOLUS
INTRAVENOUS | Status: DC | PRN
  Administered 2018-11-30 (×2): 20 mg via INTRAVENOUS

## 2018-11-30 MED ORDER — DEXAMETHASONE SODIUM PHOSPHATE 10 MG/ML IJ SOLN
INTRAMUSCULAR | Status: DC | PRN
  Administered 2018-11-30: 5 mg via INTRAVENOUS

## 2018-11-30 MED ORDER — MENTHOL 3 MG MT LOZG: 1.0000 | LOZENGE | OROMUCOSAL | Status: DC | PRN

## 2018-11-30 MED ORDER — METHOCARBAMOL 500 MG IVPB - SIMPLE MED
500.0000 mg | Freq: Four times a day (QID) | INTRAVENOUS | Status: DC | PRN
  Filled 2018-11-30: qty 50

## 2018-11-30 MED ORDER — VANCOMYCIN HCL IN DEXTROSE 1-5 GM/200ML-% IV SOLN
1000.0000 mg | Freq: Two times a day (BID) | INTRAVENOUS | Status: AC
  Administered 2018-11-30: 1000 mg via INTRAVENOUS
  Filled 2018-11-30: qty 200

## 2018-11-30 MED ORDER — MIDAZOLAM HCL 2 MG/2ML IJ SOLN
INTRAMUSCULAR | Status: AC
  Filled 2018-11-30: qty 2

## 2018-11-30 MED ORDER — FENTANYL CITRATE (PF) 100 MCG/2ML IJ SOLN
INTRAMUSCULAR | Status: DC | PRN
  Administered 2018-11-30 (×2): 50 ug via INTRAVENOUS

## 2018-11-30 MED ORDER — UMECLIDINIUM BROMIDE 62.5 MCG/INH IN AEPB
1.0000 | INHALATION_SPRAY | Freq: Every day | RESPIRATORY_TRACT | Status: DC
  Administered 2018-12-01: 1 via RESPIRATORY_TRACT
  Filled 2018-11-30: qty 7

## 2018-11-30 MED ORDER — ONDANSETRON HCL 4 MG/2ML IJ SOLN
INTRAMUSCULAR | Status: AC
  Filled 2018-11-30: qty 2

## 2018-11-30 SURGICAL SUPPLY — 62 items
BAG DECANTER FOR FLEXI CONT (MISCELLANEOUS) IMPLANT
BAG ZIPLOCK 12X15 (MISCELLANEOUS) IMPLANT
BLADE SURG SZ10 CARB STEEL (BLADE) ×4 IMPLANT
CHLORAPREP W/TINT 26 (MISCELLANEOUS) ×2 IMPLANT
CLOTH BEACON ORANGE TIMEOUT ST (SAFETY) ×2 IMPLANT
COVER PERINEAL POST (MISCELLANEOUS) ×2 IMPLANT
COVER SURGICAL LIGHT HANDLE (MISCELLANEOUS) ×2 IMPLANT
COVER WAND RF STERILE (DRAPES) IMPLANT
CUP ACET PINNACLE SECTR 60MM (Hips) ×1 IMPLANT
DECANTER SPIKE VIAL GLASS SM (MISCELLANEOUS) ×2 IMPLANT
DERMABOND ADVANCED (GAUZE/BANDAGES/DRESSINGS) ×1
DERMABOND ADVANCED .7 DNX12 (GAUZE/BANDAGES/DRESSINGS) ×1 IMPLANT
DRAPE IMP U-DRAPE 54X76 (DRAPES) ×2 IMPLANT
DRAPE SHEET LG 3/4 BI-LAMINATE (DRAPES) ×6 IMPLANT
DRAPE STERI IOBAN 125X83 (DRAPES) ×2 IMPLANT
DRAPE U-SHAPE 47X51 STRL (DRAPES) ×4 IMPLANT
DRESSING AQUACEL AG SP 3.5X10 (GAUZE/BANDAGES/DRESSINGS) ×1 IMPLANT
DRSG AQUACEL AG ADV 3.5X10 (GAUZE/BANDAGES/DRESSINGS) ×2 IMPLANT
DRSG AQUACEL AG SP 3.5X10 (GAUZE/BANDAGES/DRESSINGS) ×2
ELECT BLADE TIP CTD 4 INCH (ELECTRODE) ×2 IMPLANT
ELECT PENCIL ROCKER SW 15FT (MISCELLANEOUS) ×2 IMPLANT
ELECT REM PT RETURN 15FT ADLT (MISCELLANEOUS) ×2 IMPLANT
GAUZE SPONGE 4X4 12PLY STRL (GAUZE/BANDAGES/DRESSINGS) ×2 IMPLANT
GLOVE BIO SURGEON STRL SZ8.5 (GLOVE) ×4 IMPLANT
GLOVE BIOGEL PI IND STRL 8.5 (GLOVE) ×1 IMPLANT
GLOVE BIOGEL PI INDICATOR 8.5 (GLOVE) ×1
GOWN SPEC L3 XXLG W/TWL (GOWN DISPOSABLE) ×2 IMPLANT
HANDPIECE INTERPULSE COAX TIP (DISPOSABLE) ×1
HEAD CERAMIC 36 PLUS5 (Hips) ×2 IMPLANT
HOLDER FOLEY CATH W/STRAP (MISCELLANEOUS) ×2 IMPLANT
HOOD PEEL AWAY FLYTE STAYCOOL (MISCELLANEOUS) ×8 IMPLANT
JET LAVAGE IRRISEPT WOUND (IRRIGATION / IRRIGATOR) ×2
KIT TURNOVER KIT A (KITS) IMPLANT
LAVAGE JET IRRISEPT WOUND (IRRIGATION / IRRIGATOR) ×1 IMPLANT
LINER PINN ALTRX ACTABR 36X60 (Liner) ×1 IMPLANT
LINER PINNACLE ALTRAX ACTABULR (Liner) ×1 IMPLANT
MANIFOLD NEPTUNE II (INSTRUMENTS) ×2 IMPLANT
MARKER SKIN DUAL TIP RULER LAB (MISCELLANEOUS) ×2 IMPLANT
NDL SAFETY ECLIPSE 18X1.5 (NEEDLE) ×1 IMPLANT
NEEDLE HYPO 18GX1.5 SHARP (NEEDLE) ×1
NEEDLE SPNL 18GX3.5 QUINCKE PK (NEEDLE) ×2 IMPLANT
PACK ANTERIOR HIP CUSTOM (KITS) ×2 IMPLANT
PINNSECTOR W/GRIP ACE CUP 60MM (Hips) ×2 IMPLANT
SAW OSC TIP CART 19.5X105X1.3 (SAW) ×2 IMPLANT
SEALER BIPOLAR AQUA 6.0 (INSTRUMENTS) ×2 IMPLANT
SET HNDPC FAN SPRY TIP SCT (DISPOSABLE) ×1 IMPLANT
STEM TRI LOC BPS SZ7 W GRIPTON (Hips) ×1 IMPLANT
SUT ETHIBOND NAB CT1 #1 30IN (SUTURE) ×4 IMPLANT
SUT MNCRL AB 3-0 PS2 18 (SUTURE) ×2 IMPLANT
SUT MNCRL AB 4-0 PS2 18 (SUTURE) ×2 IMPLANT
SUT MON AB 2-0 CT1 36 (SUTURE) ×4 IMPLANT
SUT STRATAFIX PDO 1 14 VIOLET (SUTURE) ×1
SUT STRATFX PDO 1 14 VIOLET (SUTURE) ×1
SUT VIC AB 2-0 CT1 27 (SUTURE) ×1
SUT VIC AB 2-0 CT1 TAPERPNT 27 (SUTURE) ×1 IMPLANT
SUTURE STRATFX PDO 1 14 VIOLET (SUTURE) ×1 IMPLANT
SYR 3ML LL SCALE MARK (SYRINGE) ×4 IMPLANT
SYR 50ML LL SCALE MARK (SYRINGE) ×2 IMPLANT
TRAY FOLEY MTR SLVR 16FR STAT (SET/KITS/TRAYS/PACK) IMPLANT
TRI LOC BPS SZ 7 W GRIPTON (Hips) ×2 IMPLANT
WATER STERILE IRR 1000ML POUR (IV SOLUTION) ×2 IMPLANT
YANKAUER SUCT BULB TIP 10FT TU (MISCELLANEOUS) ×2 IMPLANT

## 2018-11-30 NOTE — Care Management CC44 (Signed)
Condition Code 44 Documentation Completed  Patient Details  Name: [name] [name] MRN: [id number] Date of Birth: [birth year]   Condition Code 44 given:  Yes Patient signature on Condition Code 44 notice:  Yes Documentation of 2 MD's agreement:  Yes Code 44 added to claim:  Yes    Dessa Phi, RN 11/30/2018, 3:43 PM

## 2018-11-30 NOTE — Anesthesia Procedure Notes (Signed)
Spinal  Patient location during procedure: OR End time: 11/30/2018 8:40 AM Staffing Anesthesiologist: Lidia Collum, MD Performed: anesthesiologist  Preanesthetic Checklist Completed: patient identified, site marked, surgical consent, pre-op evaluation, timeout performed, IV checked, risks and benefits discussed and monitors and equipment checked Spinal Block Patient position: sitting Prep: DuraPrep Patient monitoring: heart rate, cardiac monitor, continuous pulse ox and blood pressure Approach: midline Location: L3-4 Injection technique: single-shot Needle Needle type: Sprotte  Needle gauge: 22 G Needle length: 5 cm Assessment Sensory level: T6 Additional Notes Expiration date of kit checked and confirmed. Patient tolerated procedure well, without complications.

## 2018-11-30 NOTE — Anesthesia Postprocedure Evaluation (Signed)
Anesthesia Post Note  Patient: [name] [name]  Procedure(s) Performed: TOTAL HIP ARTHROPLASTY ANTERIOR APPROACH (Right Hip)     Patient location during evaluation: PACU Anesthesia Type: Spinal Level of consciousness: oriented and awake and alert Pain management: pain level controlled Vital Signs Assessment: post-procedure vital signs reviewed and stable Respiratory status: spontaneous breathing, respiratory function stable and nonlabored ventilation Cardiovascular status: blood pressure returned to baseline and stable Postop Assessment: no headache, no backache, no apparent nausea or vomiting and spinal receding Anesthetic complications: no    Last Vitals:  Vitals:   11/30/18 1400 11/30/18 1415  BP: 113/65 107/68  Pulse: (!) 57 65  Resp: 15 11  Temp: 36.4 C   SpO2: 100% 100%    Last Pain:  Vitals:   11/30/18 1400  TempSrc:   PainSc: 0-No pain                 Lidia Collum

## 2018-11-30 NOTE — Interval H&P Note (Signed)
History and Physical Interval Note:  11/30/2018 8:10 AM  [name] [name]  has presented today for surgery, with the diagnosis of avascular necrosis of bone of right hip.  The various methods of treatment have been discussed with the patient and family. After consideration of risks, benefits and other options for treatment, the patient has consented to  Procedure(s): TOTAL HIP ARTHROPLASTY ANTERIOR APPROACH (Right) as a surgical intervention.  The patient's history has been reviewed, patient examined, no change in status, stable for surgery.  I have reviewed the patient's chart and labs.  Questions were answered to the patient's satisfaction.     Hilton Cork Kadasia Kassing

## 2018-11-30 NOTE — Transfer of Care (Signed)
Immediate Anesthesia Transfer of Care Note  Patient: [name] [name]  Procedure(s) Performed: TOTAL HIP ARTHROPLASTY ANTERIOR APPROACH (Right Hip)  Patient Location: PACU  Anesthesia Type:Spinal  Level of Consciousness: drowsy  Airway & Oxygen Therapy: Patient Spontanous Breathing and Patient connected to face mask  Post-op Assessment: Report given to RN and Post -op Vital signs reviewed and stable  Post vital signs: Reviewed and stable  Last Vitals:  Vitals Value Taken Time  BP 73/46 11/30/2018 11:25 AM  Temp    Pulse 73 11/30/2018 11:28 AM  Resp 19 11/30/2018 11:28 AM  SpO2 100 % 11/30/2018 11:28 AM  Vitals shown include unvalidated device data.  Last Pain:  Vitals:   11/30/18 0644  TempSrc: Oral  PainSc:       Patients Stated Pain Goal: 4 (88/32/54 9826)  Complications: No apparent anesthesia complications

## 2018-11-30 NOTE — Care Management Obs Status (Signed)
[facility] NOTIFICATION   Patient Details  Name: [name] [name] MRN: [id number] Date of Birth: 1951-06-16   Medicare Observation Status Notification Given:  Yes    MahabirJuliann Pulse, RN 11/30/2018, 3:42 PM

## 2018-11-30 NOTE — Op Note (Addendum)
OPERATIVE REPORT  SURGEON: Rod Can, MD   ASSISTANT: Staff.  PREOPERATIVE DIAGNOSIS: Avascular necrosis Right hip.   POSTOPERATIVE DIAGNOSIS: Avascular necrosis Right hip.   PROCEDURE: Right total hip arthroplasty, anterior approach.   IMPLANTS: DePuy Tri Lock stem, size 7, hi offset. DePuy Pinnacle Cup, size 60 mm. DePuy Altrx liner, size 36 by 60 mm, neutral. DePuy Biolox ceramic head ball, size 36 + 5 mm.  ANESTHESIA:  Spinal  ESTIMATED BLOOD LOSS:-550 mL    ANTIBIOTICS: 1 g Vancomycin.  DRAINS: None.  SPECIMENS: Right hip heterotopic bone for pathology.   COMPLICATIONS: None.   CONDITION: PACU - hemodynamically stable.   BRIEF CLINICAL NOTE: Berk Pilot is a 68 y.o. male with a long-standing history of Right hip avascular necrosis with collapse. 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 After failing conservative management, the patient was indicated for total hip arthroplasty. The risks, benefits, and alternatives to the procedure were explained, and the patient elected to proceed.  PROCEDURE IN DETAIL: Surgical site was marked by myself in the pre-op holding area. Once inside the operating room, spinal anesthesia was obtained, and a foley catheter was inserted. The patient was then positioned on the Hana table. All bony prominences were well padded. The hip was prepped and draped in the normal sterile surgical fashion. A time-out was called verifying side and site of surgery. The patient received IV antibiotics within 60 minutes of beginning the procedure.  The direct anterior approach to the hip was performed through the Hueter interval. Lateral femoral circumflex vessels were treated with the Auqumantys. The anterior capsule was exposed and an inverted T capsulotomy was made.The femoral neck cut was made to the level of the templated cut. A corkscrew was placed into the head and the head was removed. The femoral head was found  to have delaminated cartilage. The head was passed to the back table and was measured.  Acetabular exposure was achieved, and the pulvinar and labrum were excised. Sequential reaming of the acetabulum was then performed up to a size 59 mm reamer. A 60 mm cup was then opened and impacted into place at approximately 40 degrees of abduction and 20 degrees of anteversion. The final polyethylene liner was impacted into place and acetabular osteophytes were removed.   I then gained femoral exposure taking care to protect the abductors and greater trochanter. This was performed using standard external rotation, extension, and adduction. The capsule was peeled off the inner aspect of the greater trochanter, taking care to preserve the short external rotators. 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  The HO was carefully dissected subperiosteally with Bovie electrocautery and sent as a specimen. A cookie cutter was used to enter the femoral canal, and then the femoral canal finder was placed. Sequential broaching was performed up to a size 7. Calcar planer was used on the femoral neck remnant. I placed a hi offset neck and a trial head ball. The hip was reduced. Leg lengths and offset were checked fluoroscopically. The hip was dislocated and trial components were removed. The final implants were placed, and the hip was reduced.  Fluoroscopy was used to confirm component position and leg lengths. Lengthening was required to achieve adequate stability. At 90 degrees of external rotation and full extension, the hip was stable to an anterior directed force.  The wound was copiously irrigated with normal saline using pulse lavage. Marcaine solution was injected into the periarticular soft tissue. The wound  was closed in layers using #1 Vicryl and V-Loc for the fascia, 2-0 Vicryl for the subcutaneous fat, 2-0 Monocryl for the deep dermal layer, 3-0 running Monocryl  subcuticular stitch, and Dermabond for the skin. Once the glue was fully dried, an Aquacell Ag dressing was applied. The patient was transported to the recovery room in stable condition. Sponge, needle, and instrument counts were correct at the end of the case x2. The patient tolerated the procedure well and there were no known complications.

## 2018-11-30 NOTE — Evaluation (Signed)
Physical Therapy Evaluation Patient Details Name: [name] [name] MRN: [id number] DOB: 12/09/1950 Today's Date: 11/30/2018   History of Present Illness  68 yo male s/p R DA-THA on 11/30/18. PMH includes CAD with CABG x5 and pacemaker, colon cancer, afib, PNA, gout, COPD, CHF, DM, PVD, ischemic cardiomyopathy, tobacco abuse.   Clinical Impression   Pt presents with LE weakness, difficulty performing bed mobility, increased time and effort to perform mobility tasks, unsteadiness in standing, and decreased activity tolerance. Pt to benefit from acute PT to address deficits. Pt ambulated short hallway distance with RW, with very increased time, VC for safety and form,  and min assist for steadying. PT recommending home with HHPT vs HEP, pending pt progress with mobility tomorrow. Pt lives alone, and states sister will either check in on him or stay with him if needed. Pt educated on ankle pumps (20/hour) to perform this afternoon/evening to increase circulation, to pt's tolerance and limited by pain. PT to progress mobility as tolerated, and will continue to follow acutely.        Follow Up Recommendations Follow surgeon's recommendation for DC plan and follow-up therapies;Supervision for mobility/OOB;Home health PT(HEP vs HHPT, pending pt progress tomorrow)    Equipment Recommendations  None recommended by PT    Recommendations for Other Services       Precautions / Restrictions Precautions Precautions: Fall Restrictions Weight Bearing Restrictions: No RLE Weight Bearing: Weight bearing as tolerated      Mobility  Bed Mobility Overal bed mobility: Needs Assistance Bed Mobility: Supine to Sit     Supine to sit: Min assist;HOB elevated     General bed mobility comments: Min assist for trunk elevation, RLE lifting and translation to EOB.   Transfers Overall transfer level: Needs assistance Equipment used: Rolling walker (2 wheeled) Transfers: Sit to/from Stand Sit to Stand: Min  assist;From elevated surface         General transfer comment: Min assist for power up, steadying. Verbal cuing for hand placement when rising.   Ambulation/Gait Ambulation/Gait assistance: Min assist Gait Distance (Feet): 30 Feet Assistive device: Rolling walker (2 wheeled) Gait Pattern/deviations: Step-to pattern;Decreased stride length;Step-through pattern;Trunk flexed Gait velocity: decr    General Gait Details: Min assist for steadying pt and RW, VC for placement in RW, sequencing, turning with RW, upright posture as pt with tendency to look at ground.  Stairs            Wheelchair Mobility    Modified Rankin (Stroke Patients Only)       Balance Overall balance assessment: Needs assistance   Sitting balance-Leahy Scale: Fair     Standing balance support: Bilateral upper extremity supported Standing balance-Leahy Scale: Poor Standing balance comment: reliant on external support in standing                              Pertinent Vitals/Pain Pain Assessment: No/denies pain    Home Living Family/patient expects to be discharged to:: Private residence Living Arrangements: Alone Available Help at Discharge: Family;Available PRN/intermittently Type of Home: Apartment Home Access: Ramped entrance     Home Layout: One level Home Equipment: Walker - 2 wheels;Cane - single point;Shower seat      Prior Function Level of Independence: Independent with assistive device(s)         Comments: using cane PTA, pt states he was not using tub shower because he was scared to get in and out of it  Hand Dominance   Dominant Hand: Right    Extremity/Trunk Assessment   Upper Extremity Assessment Upper Extremity Assessment: Overall WFL for tasks assessed    Lower Extremity Assessment Lower Extremity Assessment: Generalized weakness;RLE deficits/detail RLE Deficits / Details: suspected post-surgical weakness; able to perform ankle pumps, quad set,  heel slide  RLE Sensation: WNL    Cervical / Trunk Assessment Cervical / Trunk Assessment: Normal  Communication   Communication: No difficulties  Cognition Arousal/Alertness: Awake/alert Behavior During Therapy: WFL for tasks assessed/performed Overall Cognitive Status: Within Functional Limits for tasks assessed                                 General Comments: difficult to understand at times, but cognition WFL.       General Comments      Exercises     Assessment/Plan    PT Assessment Patient needs continued PT services  PT Problem List Decreased strength;Decreased mobility;Decreased range of motion;Decreased safety awareness;Decreased activity tolerance;Decreased balance;Decreased knowledge of use of DME       PT Treatment Interventions DME instruction;Functional mobility training;Balance training;Patient/family education;Gait training;Therapeutic activities;Therapeutic exercise    PT Goals (Current goals can be found in the Care Plan section)  Acute Rehab PT Goals Patient Stated Goal: go home PT Goal Formulation: With patient Time For Goal Achievement: 12/07/18 Potential to Achieve Goals: Good    Frequency 7X/week   Barriers to discharge        Co-evaluation               AM-PAC PT "6 Clicks" Mobility  Outcome Measure Help needed turning from your back to your side while in a flat bed without using bedrails?: A Little Help needed moving from lying on your back to sitting on the side of a flat bed without using bedrails?: A Little Help needed moving to and from a bed to a chair (including a wheelchair)?: A Little Help needed standing up from a chair using your arms (e.g., wheelchair or bedside chair)?: A Little Help needed to walk in hospital room?: A Little Help needed climbing 3-5 steps with a railing? : A Lot 6 Click Score: 17    End of Session Equipment Utilized During Treatment: Gait belt Activity Tolerance: Patient tolerated  treatment well;Patient limited by fatigue Patient left: in chair;with chair alarm set;with call bell/phone within reach;with SCD's reapplied Nurse Communication: Mobility status PT Visit Diagnosis: Other abnormalities of gait and mobility (R26.89);Difficulty in walking, not elsewhere classified (R26.2);Muscle weakness (generalized) (M62.81)    Time: 7564-3329 PT Time Calculation (min) (ACUTE ONLY): 28 min   Charges:   PT Evaluation $PT Eval Low Complexity: 1 Low PT Treatments $Gait Training: 8-22 mins       Julien Girt, PT Acute Rehabilitation Services Pager [phone]  Office [phone]  Lalana Wachter D Elonda Husky 11/30/2018, 7:20 PM

## 2018-11-30 NOTE — Discharge Instructions (Signed)
°Dr. Mareli Antunes °Joint Replacement Specialist °[facility] °[address] °, [place] [address] °[phone] ° ° °TOTAL HIP REPLACEMENT POSTOPERATIVE DIRECTIONS ° ° ° °Hip Rehabilitation, Guidelines Following Surgery  ° °WEIGHT BEARING °Weight bearing as tolerated with assist device (walker, cane, etc) as directed, use it as long as suggested by your surgeon or therapist, typically at least 4-6 weeks. ° °The results of a hip operation are greatly improved after range of motion and muscle strengthening exercises. Follow all safety measures which are given to protect your hip. If any of these exercises cause increased pain or swelling in your joint, decrease the amount until you are comfortable again. Then slowly increase the exercises. Call your caregiver if you have problems or questions.  ° °HOME CARE INSTRUCTIONS  °Most of the following instructions are designed to prevent the dislocation of your new hip.  °Remove items at home which could result in a fall. This includes throw rugs or furniture in walking pathways.  °Continue medications as instructed at time of discharge. °· You may have some home medications which will be placed on hold until you complete the course of blood thinner medication. °· You may start showering once you are discharged home. Do not remove your dressing. °Do not put on socks or shoes without following the instructions of your caregivers.   °Sit on chairs with arms. Use the chair arms to help push yourself up when arising.  °Arrange for the use of a toilet seat elevator so you are not sitting low.  °· Walk with walker as instructed.  °You may resume a sexual relationship in one month or when given the OK by your caregiver.  °Use walker as long as suggested by your caregivers.  °You may put full weight on your legs and walk as much as is comfortable. °Avoid periods of inactivity such as sitting longer than an hour when not asleep. This helps prevent  blood clots.  °You may return to work once you are cleared by your surgeon.  °Do not drive a car for 6 weeks or until released by your surgeon.  °Do not drive while taking narcotics.  °Wear elastic stockings for two weeks following surgery during the day but you may remove then at night.  °Make sure you keep all of your appointments after your operation with all of your doctors and caregivers. You should call the office at the above phone number and make an appointment for approximately two weeks after the date of your surgery. °Please pick up a stool softener and laxative for home use as long as you are requiring pain medications. °· ICE to the affected hip every three hours for 30 minutes at a time and then as needed for pain and swelling. Continue to use ice on the hip for pain and swelling from surgery. You may notice swelling that will progress down to the foot and ankle.  This is normal after surgery.  Elevate the leg when you are not up walking on it.   °It is important for you to complete the blood thinner medication as prescribed by your doctor. °· Continue to use the breathing machine which will help keep your temperature down.  It is common for your temperature to cycle up and down following surgery, especially at night when you are not up moving around and exerting yourself.  The breathing machine keeps your lungs expanded and your temperature down. ° °RANGE OF MOTION AND STRENGTHENING EXERCISES  °These exercises are   designed to help you keep full movement of your hip joint. Follow your caregiver's or physical therapist's instructions. Perform all exercises about fifteen times, three times per day or as directed. Exercise both hips, even if you have had only one joint replacement. These exercises can be done on a training (exercise) mat, on the floor, on a table or on a bed. Use whatever works the best and is most comfortable for you. Use music or television while you are exercising so that the exercises  are a pleasant break in your day. This will make your life better with the exercises acting as a break in routine you can look forward to.  °Lying on your back, slowly slide your foot toward your buttocks, raising your knee up off the floor. Then slowly slide your foot back down until your leg is straight again.  °Lying on your back spread your legs as far apart as you can without causing discomfort.  °Lying on your side, raise your upper leg and foot straight up from the floor as far as is comfortable. Slowly lower the leg and repeat.  °Lying on your back, tighten up the muscle in the front of your thigh (quadriceps muscles). You can do this by keeping your leg straight and trying to raise your heel off the floor. This helps strengthen the largest muscle supporting your knee.  °Lying on your back, tighten up the muscles of your buttocks both with the legs straight and with the knee bent at a comfortable angle while keeping your heel on the floor.  ° °SKILLED REHAB INSTRUCTIONS: °If the patient is transferred to a skilled rehab facility following release from the hospital, a list of the current medications will be sent to the facility for the patient to continue.  When discharged from the skilled rehab facility, please have the facility set up the patient's Home Health Physical Therapy prior to being released. Also, the skilled facility will be responsible for providing the patient with their medications at time of release from the facility to include their pain medication and their blood thinner medication. If the patient is still at the rehab facility at time of the two week follow up appointment, the skilled rehab facility will also need to assist the patient in arranging follow up appointment in our office and any transportation needs. ° °MAKE SURE YOU:  °Understand these instructions.  °Will watch your condition.  °Will get help right away if you are not doing well or get worse. ° °Pick up stool softner and  laxative for home use following surgery while on pain medications. °Do not remove your dressing. °The dressing is waterproof--it is OK to take showers. °Continue to use ice for pain and swelling after surgery. °Do not use any lotions or creams on the incision until instructed by your surgeon. °Total Hip Protocol. ° ° °

## 2018-12-01 DIAGNOSIS — J449 Chronic obstructive pulmonary disease, unspecified: Secondary | ICD-10-CM | POA: Diagnosis not present

## 2018-12-01 DIAGNOSIS — I4891 Unspecified atrial fibrillation: Secondary | ICD-10-CM | POA: Diagnosis not present

## 2018-12-01 DIAGNOSIS — M109 Gout, unspecified: Secondary | ICD-10-CM | POA: Diagnosis not present

## 2018-12-01 DIAGNOSIS — I255 Ischemic cardiomyopathy: Secondary | ICD-10-CM | POA: Diagnosis not present

## 2018-12-01 DIAGNOSIS — I5023 Acute on chronic systolic (congestive) heart failure: Secondary | ICD-10-CM | POA: Diagnosis not present

## 2018-12-01 DIAGNOSIS — M879 Osteonecrosis, unspecified: Secondary | ICD-10-CM | POA: Diagnosis not present

## 2018-12-01 DIAGNOSIS — I110 Hypertensive heart disease with heart failure: Secondary | ICD-10-CM | POA: Diagnosis not present

## 2018-12-01 DIAGNOSIS — Z951 Presence of aortocoronary bypass graft: Secondary | ICD-10-CM | POA: Diagnosis not present

## 2018-12-01 DIAGNOSIS — I70219 Atherosclerosis of native arteries of extremities with intermittent claudication, unspecified extremity: Secondary | ICD-10-CM | POA: Diagnosis not present

## 2018-12-01 LAB — BASIC METABOLIC PANEL
Anion gap: 8 (ref 5–15)
BUN: 31 mg/dL — ABNORMAL HIGH (ref 8–23)
CO2: 21 mmol/L — ABNORMAL LOW (ref 22–32)
Calcium: 8.8 mg/dL — ABNORMAL LOW (ref 8.9–10.3)
Chloride: 108 mmol/L (ref 98–111)
Creatinine, Ser: 1.99 mg/dL — ABNORMAL HIGH (ref 0.61–1.24)
GFR calc Af Amer: 39 mL/min — ABNORMAL LOW (ref >60)
GFR calc non Af Amer: 34 mL/min — ABNORMAL LOW (ref >60)
Glucose, Bld: 130 mg/dL — ABNORMAL HIGH (ref 70–99)
Potassium: 3.7 mmol/L (ref 3.5–5.1)
Sodium: 137 mmol/L (ref 135–145)

## 2018-12-01 LAB — CBC
HCT: 34.8 % — ABNORMAL LOW (ref 39.0–52.0)
Hemoglobin: 11 g/dL — ABNORMAL LOW (ref 13.0–17.0)
MCH: 30.1 pg (ref 26.0–34.0)
MCHC: 31.6 g/dL (ref 30.0–36.0)
MCV: 95.3 fL (ref 80.0–100.0)
Platelets: 148 10*3/uL — ABNORMAL LOW (ref 150–400)
RBC: 3.65 MIL/uL — ABNORMAL LOW (ref 4.22–5.81)
RDW: 14.8 % (ref 11.5–15.5)
WBC: 13.3 10*3/uL — ABNORMAL HIGH (ref 4.0–10.5)
nRBC: 0 % (ref 0.0–0.2)

## 2018-12-01 MED ORDER — DOCUSATE SODIUM 100 MG PO CAPS: 100.0000 mg | ORAL_CAPSULE | Freq: Two times a day (BID) | ORAL | 1 refills | Status: DC

## 2018-12-01 MED ORDER — HYDROCODONE-ACETAMINOPHEN 5-325 MG PO TABS: 1.0000 | ORAL_TABLET | ORAL | 0 refills | Status: DC | PRN

## 2018-12-01 MED ORDER — SENNA 8.6 MG PO TABS: 1.0000 | ORAL_TABLET | Freq: Two times a day (BID) | ORAL | 0 refills | Status: AC

## 2018-12-01 MED ORDER — ONDANSETRON HCL 4 MG PO TABS: 4.0000 mg | ORAL_TABLET | Freq: Four times a day (QID) | ORAL | 0 refills | Status: DC | PRN

## 2018-12-01 MED ORDER — ASPIRIN 81 MG PO CHEW: 81.0000 mg | CHEWABLE_TABLET | Freq: Two times a day (BID) | ORAL | 1 refills | Status: DC

## 2018-12-01 NOTE — Progress Notes (Signed)
Discharge paperwork reviewed with patient, all questions answered. IV removed. Pt discharged with all patient belongings.

## 2018-12-01 NOTE — Progress Notes (Signed)
    Subjective:  Patient reports pain as mild to moderate.  Denies N/V/CP/SOB. Wants to go home.  Objective:   VITALS:   Vitals:   11/30/18 2139 12/01/18 0407 12/01/18 0900 12/01/18 0927  BP: 111/66 111/76 95/67 93/62   Pulse: 66 71    Resp: 18 20    Temp: 97.8 F (36.6 C) 97.8 F (36.6 C)    TempSrc: Oral     SpO2: 99% 100%    Weight:      Height:        NAD ABD soft Sensation intact distally Intact pulses distally Dorsiflexion/Plantar flexion intact Incision: dressing C/D/I Compartment soft   Lab Results  Component Value Date   WBC 13.3 (H) 12/01/2018   HGB 11.0 (L) 12/01/2018   HCT 34.8 (L) 12/01/2018   MCV 95.3 12/01/2018   PLT 148 (L) 12/01/2018   BMET    Component Value Date/Time   NA 137 12/01/2018 0911   NA 141 08/31/2017 1433   K 3.7 12/01/2018 0911   CL 108 12/01/2018 0911   CO2 21 (L) 12/01/2018 0911   GLUCOSE 130 (H) 12/01/2018 0911   BUN 31 (H) 12/01/2018 0911   BUN 33 (H) 08/31/2017 1433   CREATININE 1.99 (H) 12/01/2018 0911   CREATININE 1.35 (H) 12/25/2015 1310   CALCIUM 8.8 (L) 12/01/2018 0911   GFRNONAA 34 (L) 12/01/2018 0911   GFRAA 39 (L) 12/01/2018 0911     Assessment/Plan: 1 Day Post-Op   Principal Problem:   Avascular necrosis of bone of right hip (HCC) Active Problems:   Avascular necrosis of hip, right ([place])   WBAT with walker DVT ppx: Aspirin, SCDs, TEDS PO pain control PT/OT Dispo: D/C home with HEP   [name] [name] 12/01/2018, 10:52 AM   Rod Can, MD Cell: [phone] [facility] now [facility]  [place] [address], [address], [place], [place] [address] Phone: [phone] [email or web address] Facebook  [employer]

## 2018-12-01 NOTE — Discharge Summary (Signed)
Physician Discharge Summary  Patient ID: [name] [name] MRN: [id number] DOB/AGE: [birth year] 68 y.o.  Admit date: 11/30/2018 Discharge date: 12/01/2018  Admission Diagnoses:  Avascular necrosis of bone of right hip [facility])  Discharge Diagnoses:  Principal Problem:   Avascular necrosis of bone of right hip ([name]) Active Problems:   Avascular necrosis of hip, right [facility])   Past Medical History:  Diagnosis Date  . AICD (automatic cardioverter/defibrillator) present    reports no shocks   . CAD (coronary artery disease)    a. 04/2013 Cath: LM 10, LAD 40p, 9m, D1 50p, LCX 60p, 79m/100m, OM1 50, OM2 100 L-L collats, RCA 100p;  b. CABG x 5: LIMA->LAD, VG->D2, VG->RI->OM2, VG->PDA.  Marland Kitchen Chronic systolic CHF (congestive heart failure) ([place])    a. 04/2013 Echo: EF 15-20%, diff HK, sev HK of inf and ant myocardium, dilated LA,  b. EF 20-25% with restrictive filling pattern, mod RV dysfx, mild MR (10/10/2013);  c.  Echo 12/15: EF 25-30%  . COPD (chronic obstructive pulmonary disease) ([place])   . Diabetes mellitus without complication ([place])    unsure what type but was dx as an adult, takes no meds, check his cbg at home 2 times a week   . Emphysema   . Gout   . Hyperlipidemia   . Hypertension   . Ischemic cardiomyopathy   . Peripheral neuropathy    reprots bilateral lower extemity paresthesia   . PVD (peripheral vascular disease) ([place])    a. (10/2013) ABIs: RIGHT 0.63, Waveforms: monophasic;  LEFT 0.78, Waveforms: monophasic  . Tobacco abuse    30+ pack-year history  . Transaminitis    a. 04/2013 Abd U/S and CT unremarkable, hepatitis panel neg -->felt to be 2/2 acute R heart failure.    Surgeries: Procedure(s): TOTAL HIP ARTHROPLASTY ANTERIOR APPROACH on 11/30/2018   Consultants (if any):   Discharged Condition: Improved  Hospital Course: Goldman [name] is an 68 y.o. male who was admitted 11/30/2018 with a diagnosis of Avascular necrosis of bone of right hip ([name]) and went to the operating room on  11/30/2018 and underwent the above named procedures.    He was given perioperative antibiotics:  Anti-infectives (From admission, onward)   Start     Dose/Rate Route Frequency Ordered Stop   11/30/18 1800  vancomycin (VANCOCIN) IVPB 1000 mg/200 mL premix     1,000 mg 200 mL/hr over 60 Minutes Intravenous Every 12 hours 11/30/18 1649 11/30/18 1912   11/30/18 0630  vancomycin (VANCOCIN) IVPB 1000 mg/200 mL premix     1,000 mg 200 mL/hr over 60 Minutes Intravenous On call to O.R. 11/30/18 6962 11/30/18 0817    .  He was given sequential compression devices, early ambulation, and ASA for DVT prophylaxis.  He benefited maximally from the hospital stay and there were no complications.    Recent vital signs:  Vitals:   12/01/18 0900 12/01/18 0927  BP: 95/67 93/62  Pulse:    Resp:    Temp:    SpO2:      Recent laboratory studies:  Lab Results  Component Value Date   HGB 11.0 (L) 12/01/2018   HGB 13.1 11/23/2018   HGB 14.4 04/03/2015   Lab Results  Component Value Date   WBC 13.3 (H) 12/01/2018   PLT 148 (L) 12/01/2018   Lab Results  Component Value Date   INR 1.0 11/23/2018   Lab Results  Component Value Date   NA 137 12/01/2018   K 3.7 12/01/2018   CL 108 12/01/2018  CO2 21 (L) 12/01/2018   BUN 31 (H) 12/01/2018   CREATININE 1.99 (H) 12/01/2018   GLUCOSE 130 (H) 12/01/2018    Discharge Medications:   Allergies as of 12/01/2018      Reactions   Penicillins Nausea And Vomiting, Swelling, Other (See Comments)   Per Dr Audria Nine Via phone 12/27/12 0400 Swelling , long time ago. Doesn't remember      Medication List    STOP taking these medications   aspirin EC 81 MG tablet Replaced by: aspirin 81 MG chewable tablet   celecoxib 100 MG capsule Commonly known as: CELEBREX   traMADol 50 MG tablet Commonly known as: ULTRAM     TAKE these medications   albuterol 108 (90 Base) MCG/ACT inhaler Commonly known as: VENTOLIN HFA Inhale 1 puff into the  lungs every 6 (six) hours as needed for wheezing or shortness of breath.   allopurinol 300 MG tablet Commonly known as: ZYLOPRIM Take 300 mg by mouth daily.   aspirin 81 MG chewable tablet Chew 1 tablet (81 mg total) by mouth 2 (two) times daily. Replaces: aspirin EC 81 MG tablet   atorvastatin 20 MG tablet Commonly known as: LIPITOR Take 20 mg by mouth daily.   azelastine 0.05 % ophthalmic solution Commonly known as: OPTIVAR Place 1 drop into the left eye 2 (two) times daily.   b complex vitamins tablet Take 1 tablet by mouth daily.   budesonide-formoterol 80-4.5 MCG/ACT inhaler Commonly known as: SYMBICORT Inhale 2 puffs into the lungs 2 (two) times a day.   carvedilol 6.25 MG tablet Commonly known as: COREG Take 6.25 mg by mouth 3 (three) times daily.   Colcrys 0.6 MG tablet Generic drug: colchicine Take 0.6 mg by mouth daily as needed (gout).   cyclobenzaprine 5 MG tablet Commonly known as: FLEXERIL Take 5 mg by mouth 3 (three) times daily as needed for muscle spasms.   docusate sodium 100 MG capsule Commonly known as: COLACE Take 1 capsule (100 mg total) by mouth 2 (two) times daily.   Entresto 24-26 MG Generic drug: sacubitril-valsartan Take 1 tablet by mouth 2 (two) times daily.   furosemide 40 MG tablet Commonly known as: LASIX Take 40 mg by mouth daily.   HYDROcodone-acetaminophen 5-325 MG tablet Commonly known as: NORCO/VICODIN Take 1-2 tablets by mouth every 4 (four) hours as needed for moderate pain (pain score 4-6).   ibuprofen 200 MG tablet Commonly known as: ADVIL Take 200 mg by mouth every 6 (six) hours as needed for moderate pain.   omeprazole 20 MG capsule Commonly known as: PRILOSEC Take 20 mg by mouth daily.   ondansetron 4 MG tablet Commonly known as: ZOFRAN Take 1 tablet (4 mg total) by mouth every 6 (six) hours as needed for nausea.   senna 8.6 MG Tabs tablet Commonly known as: SENOKOT Take 1 tablet (8.6 mg total) by mouth 2  (two) times daily.   spironolactone 25 MG tablet Commonly known as: ALDACTONE TAKE 1 TABLET BY MOUTH EVERY DAY   tiotropium 18 MCG inhalation capsule Commonly known as: SPIRIVA Place 18 mcg into inhaler and inhale daily.       Diagnostic Studies: Dg Pelvis Portable  Result Date: 11/30/2018 CLINICAL DATA:  Post RIGHT total hip replacement EXAM: PORTABLE PELVIS 1-2 VIEWS COMPARISON:  Portable exam 1239 hours compared to earlier intraoperative images of 11/30/2018 FINDINGS: RIGHT hip prosthesis identified. No fracture, dislocation or bone destruction. SI joints and LEFT hip joint space preserved. Calcification identified at iliopsoas tendon  insertion at the LEFT lesser trochanter. IMPRESSION: Post RIGHT hip arthroplasty without acute complication. Electronically Signed   By: Lavonia Dana M.D.   On: 11/30/2018 13:23   Dg C-arm 1-60 Min  Result Date: 11/30/2018 CLINICAL DATA:  Intraoperative imaging for right hip replacement. EXAM: DG C-ARM 61-120 MIN; OPERATIVE RIGHT HIP WITH PELVIS COMPARISON:  Plain films right hip 03/17/2017. FINDINGS: Four fluoroscopic spot views are provided. Images demonstrate placement of a right total hip arthroplasty. No acute abnormality is identified. IMPRESSION: Intraoperative imaging for right hip replacement.  No acute finding. Electronically Signed   By: Inge Rise M.D.   On: 11/30/2018 12:19   Dg Hip Operative Unilat W Or W/o Pelvis Right  Result Date: 11/30/2018 CLINICAL DATA:  Intraoperative imaging for right hip replacement. EXAM: DG C-ARM 61-120 MIN; OPERATIVE RIGHT HIP WITH PELVIS COMPARISON:  Plain films right hip 03/17/2017. FINDINGS: Four fluoroscopic spot views are provided. Images demonstrate placement of a right total hip arthroplasty. No acute abnormality is identified. IMPRESSION: Intraoperative imaging for right hip replacement.  No acute finding. Electronically Signed   By: Inge Rise M.D.   On: 11/30/2018 12:19    Disposition:      Follow-up Information    Teren Franckowiak, Aaron Edelman, MD. Schedule an appointment as soon as possible for a visit in 2 weeks.   Specialty: Orthopedic Surgery Why: For wound re-check Contact information: [address] [place] [address] [phone]            Signed: Hilton Cork Lilyian Quayle 12/01/2018, 10:59 AM

## 2018-12-01 NOTE — Progress Notes (Signed)
Physical Therapy Treatment Patient Details Name: [name] [name] MRN: [id number] DOB: 1951-02-08 Today's Date: 12/01/2018    History of Present Illness 68 yo male s/p R DA-THA on 11/30/18. PMH includes CAD with CABG x5 and pacemaker, colon cancer, afib, PNA, gout, COPD, CHF, DM, PVD, ischemic cardiomyopathy, tobacco abuse.     PT Comments    Pt ambulated in hallway and performed LE exercises.  Pt provided with HEP handout.  Pt had no further questions and anticipates d/c home today.   Follow Up Recommendations  Follow surgeon's recommendation for DC plan and follow-up therapies;Supervision for mobility/OOB;Home health PT     Equipment Recommendations  None recommended by PT    Recommendations for Other Services       Precautions / Restrictions Precautions Precautions: Fall Restrictions RLE Weight Bearing: Weight bearing as tolerated    Mobility  Bed Mobility               General bed mobility comments: pt up in recliner  Transfers Overall transfer level: Needs assistance Equipment used: Rolling walker (2 wheeled) Transfers: Sit to/from Stand Sit to Stand: Min guard         General transfer comment: verbal cues for hand placement  Ambulation/Gait Ambulation/Gait assistance: Min guard Gait Distance (Feet): 200 Feet Assistive device: Rolling walker (2 wheeled) Gait Pattern/deviations: Decreased stride length;Step-through pattern;Trunk flexed Gait velocity: decr    General Gait Details: verbal cues for RW Positioning and posture   Stairs             Wheelchair Mobility    Modified Rankin (Stroke Patients Only)       Balance                                            Cognition Arousal/Alertness: Awake/alert Behavior During Therapy: WFL for tasks assessed/performed Overall Cognitive Status: Within Functional Limits for tasks assessed                                        Exercises Total Joint  Exercises Ankle Circles/Pumps: AROM;10 reps;Both Quad Sets: AROM;Both;10 reps Heel Slides: AROM;10 reps;Right Hip ABduction/ADduction: AROM;Right;10 reps;Standing;Supine Long Arc Quad: AROM;10 reps;Seated;Right Knee Flexion: AROM;Right;10 reps;Standing Marching in Standing: AROM;Right;Standing;10 reps Standing Hip Extension: AROM;Right;Standing;10 reps    General Comments        Pertinent Vitals/Pain Pain Assessment: 0-10 Pain Score: 3  Pain Location: right hip Pain Descriptors / Indicators: Sore Pain Intervention(s): Limited activity within patient's tolerance;Monitored during session;Repositioned    Home Living                      Prior Function            PT Goals (current goals can now be found in the care plan section) Progress towards PT goals: Progressing toward goals    Frequency    7X/week      PT Plan Current plan remains appropriate    Co-evaluation              AM-PAC PT "6 Clicks" Mobility   Outcome Measure  Help needed turning from your back to your side while in a flat bed without using bedrails?: A Little Help needed moving from lying on your back to sitting on the side of a  flat bed without using bedrails?: A Little Help needed moving to and from a bed to a chair (including a wheelchair)?: A Little Help needed standing up from a chair using your arms (e.g., wheelchair or bedside chair)?: A Little Help needed to walk in hospital room?: A Little Help needed climbing 3-5 steps with a railing? : A Little 6 Click Score: 18    End of Session   Activity Tolerance: Patient tolerated treatment well Patient left: in chair;with chair alarm set;with call bell/phone within reach Nurse Communication: Mobility status PT Visit Diagnosis: Other abnormalities of gait and mobility (R26.89);Difficulty in walking, not elsewhere classified (R26.2);Muscle weakness (generalized) (M62.81)     Time: 3244-0102 PT Time Calculation (min) (ACUTE  ONLY): 18 min  Charges:  $Therapeutic Exercise: 8-22 mins                    Carmelia Bake, PT, DPT Acute Rehabilitation Services Office: [phone] Pager: [phone]  Trena Platt 12/01/2018, 12:43 PM

## 2018-12-02 ENCOUNTER — Encounter: Payer: Self-pay | Admitting: Sports Medicine

## 2018-12-02 ENCOUNTER — Other Ambulatory Visit: Payer: Self-pay

## 2018-12-02 ENCOUNTER — Ambulatory Visit (INDEPENDENT_AMBULATORY_CARE_PROVIDER_SITE_OTHER): Payer: Medicare HMO | Admitting: Sports Medicine

## 2018-12-02 VITALS — Temp 97.6°F | Resp 16

## 2018-12-02 DIAGNOSIS — B351 Tinea unguium: Secondary | ICD-10-CM | POA: Diagnosis not present

## 2018-12-02 DIAGNOSIS — E1140 Type 2 diabetes mellitus with diabetic neuropathy, unspecified: Secondary | ICD-10-CM | POA: Diagnosis not present

## 2018-12-02 DIAGNOSIS — M79676 Pain in unspecified toe(s): Secondary | ICD-10-CM | POA: Diagnosis not present

## 2018-12-02 NOTE — Progress Notes (Signed)
Patient ID: [name] [name], male   DOB: 1950-06-24, 68 y.o.   MRN: [id number]  Subjective: [name] [name] is a 68 y.o. male patient with history of type 2 diabetes who returns to office today complaining of long, painful nails  while ambulating in shoes, unable to trim. Patient states that the glucose reading has been "good" and as well as A1c. Patient denies any new changes in medication or new problems except he had hip surgery on Wednesday. No other problems.   Patient Active Problem List   Diagnosis Date Noted  . Avascular necrosis of bone of right hip ([place]) 11/30/2018  . Avascular necrosis of hip, right ([place]) 11/30/2018  . Chest pain 04/08/2015  . Abnormal nuclear stress test 04/08/2015  . Ischemic cardiomyopathy 07/06/2014  . Lumbago 03/13/2014  . Special screening for malignant neoplasms, colon 02/09/2014  . Normocytic anemia 02/09/2014  . Pleural effusion on left 11/28/2013  . Atherosclerosis of native arteries of extremity with intermittent claudication ([place]) 11/07/2013  . Weakness of both legs 11/07/2013  . Smoker 09/19/2013  . Numbness in right leg/ Foot 06/14/2013  . Pain in limb-Right Leg/foot 06/14/2013  . Atherosclerosis of native arteries of the extremities with ulceration(440.23) 06/14/2013  . Chronic systolic heart failure ([place]) 05/29/2013  . Atrial fibrillation ([place]) 05/29/2013  . Chest pain, mid sternal 05/19/2013  . S/P CABG x 5 05/08/2013  . Right foot ulcer ([place]) 05/04/2013  . Coronary atherosclerosis of native coronary artery 04/28/2013  . Acute systolic heart failure ([place]) 04/28/2013  . Abnormal blood chemistry 12/30/2012  . Abnormal LFTs 12/30/2012  . Aspiration pneumonia ([place]) 12/30/2012  . Chronic congestive heart failure ([place]) 12/27/2012  . Leg weakness 12/26/2012  . Pneumonia 05/18/2011  . Gout 05/17/2011  . Respiratory distress, acute 05/17/2011  . Hypertension 05/17/2011  . COPD (chronic obstructive pulmonary disease) ([place]) 05/17/2011  . CHF, acute ([place])  05/17/2011   Current Outpatient Medications on File Prior to Visit  Medication Sig Dispense Refill  . albuterol (PROVENTIL HFA;VENTOLIN HFA) 108 (90 BASE) MCG/ACT inhaler Inhale 1 puff into the lungs every 6 (six) hours as needed for wheezing or shortness of breath.    . allopurinol (ZYLOPRIM) 300 MG tablet Take 300 mg by mouth daily.    Marland Kitchen aspirin 81 MG chewable tablet Chew 1 tablet (81 mg total) by mouth 2 (two) times daily. 60 tablet 1  . atorvastatin (LIPITOR) 20 MG tablet Take 20 mg by mouth daily.    Marland Kitchen azelastine (OPTIVAR) 0.05 % ophthalmic solution Place 1 drop into the left eye 2 (two) times daily.   0  . b complex vitamins tablet Take 1 tablet by mouth daily.    . budesonide-formoterol (SYMBICORT) 80-4.5 MCG/ACT inhaler Inhale 2 puffs into the lungs 2 (two) times a day.     . carvedilol (COREG) 6.25 MG tablet Take 6.25 mg by mouth 3 (three) times daily.    . colchicine (COLCRYS) 0.6 MG tablet Take 0.6 mg by mouth daily as needed (gout).    . cyclobenzaprine (FLEXERIL) 5 MG tablet Take 5 mg by mouth 3 (three) times daily as needed for muscle spasms.    Marland Kitchen docusate sodium (COLACE) 100 MG capsule Take 1 capsule (100 mg total) by mouth 2 (two) times daily. 60 capsule 1  . furosemide (LASIX) 40 MG tablet Take 40 mg by mouth daily.    Marland Kitchen HYDROcodone-acetaminophen (NORCO/VICODIN) 5-325 MG tablet Take 1-2 tablets by mouth every 4 (four) hours as needed for moderate pain (pain score 4-6). [place]  tablet 0  . ibuprofen (ADVIL) 200 MG tablet Take 200 mg by mouth every 6 (six) hours as needed for moderate pain.    Marland Kitchen ibuprofen (ADVIL) 800 MG tablet     . omeprazole (PRILOSEC) 20 MG capsule Take 20 mg by mouth daily.    . ondansetron (ZOFRAN) 4 MG tablet Take 1 tablet (4 mg total) by mouth every 6 (six) hours as needed for nausea. 20 tablet 0  . sacubitril-valsartan (ENTRESTO) 24-26 MG Take 1 tablet by mouth 2 (two) times daily.    Marland Kitchen senna (SENOKOT) 8.6 MG TABS tablet Take 1 tablet (8.6 mg total) by mouth 2  (two) times daily. 120 each 0  . spironolactone (ALDACTONE) 25 MG tablet TAKE 1 TABLET BY MOUTH EVERY DAY 90 tablet 3  . tiotropium (SPIRIVA) 18 MCG inhalation capsule Place 18 mcg into inhaler and inhale daily.     No current facility-administered medications on file prior to visit.    Allergies  Allergen Reactions  . Penicillins Nausea And Vomiting, Swelling and Other (See Comments)    Per Dr Audria Nine Via phone 12/27/12 0400 Swelling , long time ago. Doesn't remember   Objective: General: Patient is awake, alert, and oriented x 3 and in no acute distress.  Integument: Skin is warm, dry and supple bilateral. Continued Moderate plantar>dorsal xerosis bilateral, slowly improving. Nails are tender, long, thickened and dystrophic with subungual debris, consistent with onychomycosis, 1-5 bilateral. No signs of infection.  Minimal callus to 1st toes.  Remaining integument unremarkable.  Vasculature:  Dorsalis Pedis pulse 1/4 bilateral. Posterior Tibial pulse  0/4 bilateral.  Capillary fill time <3 sec 1-5 bilateral. Scant hair growth to the level of the digits. Temperature gradient within normal limits. No varicosities present bilateral. Mild trace edema present bilateral ankles.   Neurology: The patient has diminished sensation measured with a 5.07/10g Semmes Weinstein Monofilament at all pedal sites bilateral . Vibratory sensation diminished bilateral with tuning fork. No Babinski sign present bilateral.   Musculoskeletal: Asymptomatioc pes planus and bunion pedal deformities noted bilateral. Muscular strength 5/5 in all lower extremity muscular groups bilateral without pain or limitation on range of motion. No tenderness with calf compression bilateral.  Assessment and Plan: Problem List Items Addressed This Visit      Other   Pain in limb-Right Leg/foot    Other Visit Diagnoses    Dermatophytosis of nail    -  Primary   Type 2 diabetes, controlled, with neuropathy ([place])          -Examined patient. -Discussed and educated patient on diabetic foot care, especially with regards to the vascular, neurological and musculoskeletal systems.  -Mechanically debrided all nails 1-5 bilateral using sterile nail nipper and filed with dremel without incident  -Advised patient to continue with daily skin emollients for dry skin like recommended before -Patient to return in 3 months for at risk foot care  -Patient advised to call the office if any problems or questions arise in the meantime.  Landis Martins, DPM

## 2018-12-05 DIAGNOSIS — E1151 Type 2 diabetes mellitus with diabetic peripheral angiopathy without gangrene: Secondary | ICD-10-CM | POA: Diagnosis not present

## 2018-12-05 DIAGNOSIS — J449 Chronic obstructive pulmonary disease, unspecified: Secondary | ICD-10-CM | POA: Diagnosis not present

## 2018-12-05 DIAGNOSIS — I509 Heart failure, unspecified: Secondary | ICD-10-CM | POA: Diagnosis not present

## 2018-12-05 DIAGNOSIS — M159 Polyosteoarthritis, unspecified: Secondary | ICD-10-CM | POA: Diagnosis not present

## 2018-12-05 DIAGNOSIS — M545 Low back pain: Secondary | ICD-10-CM | POA: Diagnosis not present

## 2018-12-05 DIAGNOSIS — Z471 Aftercare following joint replacement surgery: Secondary | ICD-10-CM | POA: Diagnosis not present

## 2018-12-05 DIAGNOSIS — I110 Hypertensive heart disease with heart failure: Secondary | ICD-10-CM | POA: Diagnosis not present

## 2018-12-05 DIAGNOSIS — I2510 Atherosclerotic heart disease of native coronary artery without angina pectoris: Secondary | ICD-10-CM | POA: Diagnosis not present

## 2018-12-05 DIAGNOSIS — E1140 Type 2 diabetes mellitus with diabetic neuropathy, unspecified: Secondary | ICD-10-CM | POA: Diagnosis not present

## 2018-12-08 DIAGNOSIS — J449 Chronic obstructive pulmonary disease, unspecified: Secondary | ICD-10-CM | POA: Diagnosis not present

## 2018-12-08 DIAGNOSIS — I2510 Atherosclerotic heart disease of native coronary artery without angina pectoris: Secondary | ICD-10-CM | POA: Diagnosis not present

## 2018-12-08 DIAGNOSIS — R2689 Other abnormalities of gait and mobility: Secondary | ICD-10-CM | POA: Diagnosis not present

## 2018-12-08 DIAGNOSIS — M545 Low back pain: Secondary | ICD-10-CM | POA: Diagnosis not present

## 2018-12-08 DIAGNOSIS — E1140 Type 2 diabetes mellitus with diabetic neuropathy, unspecified: Secondary | ICD-10-CM | POA: Diagnosis not present

## 2018-12-08 DIAGNOSIS — E119 Type 2 diabetes mellitus without complications: Secondary | ICD-10-CM | POA: Diagnosis not present

## 2018-12-08 DIAGNOSIS — I509 Heart failure, unspecified: Secondary | ICD-10-CM | POA: Diagnosis not present

## 2018-12-08 DIAGNOSIS — M87051 Idiopathic aseptic necrosis of right femur: Secondary | ICD-10-CM | POA: Diagnosis not present

## 2018-12-08 DIAGNOSIS — E1151 Type 2 diabetes mellitus with diabetic peripheral angiopathy without gangrene: Secondary | ICD-10-CM | POA: Diagnosis not present

## 2018-12-08 DIAGNOSIS — M159 Polyosteoarthritis, unspecified: Secondary | ICD-10-CM | POA: Diagnosis not present

## 2018-12-08 DIAGNOSIS — I110 Hypertensive heart disease with heart failure: Secondary | ICD-10-CM | POA: Diagnosis not present

## 2018-12-08 DIAGNOSIS — Z471 Aftercare following joint replacement surgery: Secondary | ICD-10-CM | POA: Diagnosis not present

## 2018-12-09 DIAGNOSIS — I110 Hypertensive heart disease with heart failure: Secondary | ICD-10-CM | POA: Diagnosis not present

## 2018-12-09 DIAGNOSIS — I2510 Atherosclerotic heart disease of native coronary artery without angina pectoris: Secondary | ICD-10-CM | POA: Diagnosis not present

## 2018-12-09 DIAGNOSIS — E1140 Type 2 diabetes mellitus with diabetic neuropathy, unspecified: Secondary | ICD-10-CM | POA: Diagnosis not present

## 2018-12-09 DIAGNOSIS — J449 Chronic obstructive pulmonary disease, unspecified: Secondary | ICD-10-CM | POA: Diagnosis not present

## 2018-12-09 DIAGNOSIS — E119 Type 2 diabetes mellitus without complications: Secondary | ICD-10-CM | POA: Diagnosis not present

## 2018-12-09 DIAGNOSIS — I509 Heart failure, unspecified: Secondary | ICD-10-CM | POA: Diagnosis not present

## 2018-12-09 DIAGNOSIS — M159 Polyosteoarthritis, unspecified: Secondary | ICD-10-CM | POA: Diagnosis not present

## 2018-12-09 DIAGNOSIS — Z471 Aftercare following joint replacement surgery: Secondary | ICD-10-CM | POA: Diagnosis not present

## 2018-12-09 DIAGNOSIS — E1151 Type 2 diabetes mellitus with diabetic peripheral angiopathy without gangrene: Secondary | ICD-10-CM | POA: Diagnosis not present

## 2018-12-09 DIAGNOSIS — M545 Low back pain: Secondary | ICD-10-CM | POA: Diagnosis not present

## 2018-12-12 DIAGNOSIS — J449 Chronic obstructive pulmonary disease, unspecified: Secondary | ICD-10-CM | POA: Diagnosis not present

## 2018-12-12 DIAGNOSIS — E1151 Type 2 diabetes mellitus with diabetic peripheral angiopathy without gangrene: Secondary | ICD-10-CM | POA: Diagnosis not present

## 2018-12-12 DIAGNOSIS — E1140 Type 2 diabetes mellitus with diabetic neuropathy, unspecified: Secondary | ICD-10-CM | POA: Diagnosis not present

## 2018-12-12 DIAGNOSIS — Z471 Aftercare following joint replacement surgery: Secondary | ICD-10-CM | POA: Diagnosis not present

## 2018-12-12 DIAGNOSIS — M545 Low back pain: Secondary | ICD-10-CM | POA: Diagnosis not present

## 2018-12-12 DIAGNOSIS — I509 Heart failure, unspecified: Secondary | ICD-10-CM | POA: Diagnosis not present

## 2018-12-12 DIAGNOSIS — M159 Polyosteoarthritis, unspecified: Secondary | ICD-10-CM | POA: Diagnosis not present

## 2018-12-12 DIAGNOSIS — I2510 Atherosclerotic heart disease of native coronary artery without angina pectoris: Secondary | ICD-10-CM | POA: Diagnosis not present

## 2018-12-12 DIAGNOSIS — I110 Hypertensive heart disease with heart failure: Secondary | ICD-10-CM | POA: Diagnosis not present

## 2018-12-13 DIAGNOSIS — Z471 Aftercare following joint replacement surgery: Secondary | ICD-10-CM | POA: Diagnosis not present

## 2018-12-13 DIAGNOSIS — I509 Heart failure, unspecified: Secondary | ICD-10-CM | POA: Diagnosis not present

## 2018-12-13 DIAGNOSIS — Z96641 Presence of right artificial hip joint: Secondary | ICD-10-CM | POA: Diagnosis not present

## 2018-12-13 DIAGNOSIS — I110 Hypertensive heart disease with heart failure: Secondary | ICD-10-CM | POA: Diagnosis not present

## 2018-12-13 DIAGNOSIS — M159 Polyosteoarthritis, unspecified: Secondary | ICD-10-CM | POA: Diagnosis not present

## 2018-12-13 DIAGNOSIS — M545 Low back pain: Secondary | ICD-10-CM | POA: Diagnosis not present

## 2018-12-13 DIAGNOSIS — E1151 Type 2 diabetes mellitus with diabetic peripheral angiopathy without gangrene: Secondary | ICD-10-CM | POA: Diagnosis not present

## 2018-12-13 DIAGNOSIS — E1140 Type 2 diabetes mellitus with diabetic neuropathy, unspecified: Secondary | ICD-10-CM | POA: Diagnosis not present

## 2018-12-13 DIAGNOSIS — J449 Chronic obstructive pulmonary disease, unspecified: Secondary | ICD-10-CM | POA: Diagnosis not present

## 2018-12-13 DIAGNOSIS — I2510 Atherosclerotic heart disease of native coronary artery without angina pectoris: Secondary | ICD-10-CM | POA: Diagnosis not present

## 2018-12-14 DIAGNOSIS — Z471 Aftercare following joint replacement surgery: Secondary | ICD-10-CM | POA: Diagnosis not present

## 2018-12-14 DIAGNOSIS — I509 Heart failure, unspecified: Secondary | ICD-10-CM | POA: Diagnosis not present

## 2018-12-14 DIAGNOSIS — J449 Chronic obstructive pulmonary disease, unspecified: Secondary | ICD-10-CM | POA: Diagnosis not present

## 2018-12-14 DIAGNOSIS — M159 Polyosteoarthritis, unspecified: Secondary | ICD-10-CM | POA: Diagnosis not present

## 2018-12-14 DIAGNOSIS — I110 Hypertensive heart disease with heart failure: Secondary | ICD-10-CM | POA: Diagnosis not present

## 2018-12-14 DIAGNOSIS — E1140 Type 2 diabetes mellitus with diabetic neuropathy, unspecified: Secondary | ICD-10-CM | POA: Diagnosis not present

## 2018-12-14 DIAGNOSIS — E1151 Type 2 diabetes mellitus with diabetic peripheral angiopathy without gangrene: Secondary | ICD-10-CM | POA: Diagnosis not present

## 2018-12-14 DIAGNOSIS — M545 Low back pain: Secondary | ICD-10-CM | POA: Diagnosis not present

## 2018-12-14 DIAGNOSIS — I2510 Atherosclerotic heart disease of native coronary artery without angina pectoris: Secondary | ICD-10-CM | POA: Diagnosis not present

## 2018-12-16 DIAGNOSIS — M545 Low back pain: Secondary | ICD-10-CM | POA: Diagnosis not present

## 2018-12-16 DIAGNOSIS — M159 Polyosteoarthritis, unspecified: Secondary | ICD-10-CM | POA: Diagnosis not present

## 2018-12-16 DIAGNOSIS — Z471 Aftercare following joint replacement surgery: Secondary | ICD-10-CM | POA: Diagnosis not present

## 2018-12-16 DIAGNOSIS — I2510 Atherosclerotic heart disease of native coronary artery without angina pectoris: Secondary | ICD-10-CM | POA: Diagnosis not present

## 2018-12-16 DIAGNOSIS — E1151 Type 2 diabetes mellitus with diabetic peripheral angiopathy without gangrene: Secondary | ICD-10-CM | POA: Diagnosis not present

## 2018-12-16 DIAGNOSIS — I110 Hypertensive heart disease with heart failure: Secondary | ICD-10-CM | POA: Diagnosis not present

## 2018-12-16 DIAGNOSIS — E1140 Type 2 diabetes mellitus with diabetic neuropathy, unspecified: Secondary | ICD-10-CM | POA: Diagnosis not present

## 2018-12-16 DIAGNOSIS — J449 Chronic obstructive pulmonary disease, unspecified: Secondary | ICD-10-CM | POA: Diagnosis not present

## 2018-12-16 DIAGNOSIS — I509 Heart failure, unspecified: Secondary | ICD-10-CM | POA: Diagnosis not present

## 2018-12-19 DIAGNOSIS — Z471 Aftercare following joint replacement surgery: Secondary | ICD-10-CM | POA: Diagnosis not present

## 2018-12-19 DIAGNOSIS — M159 Polyosteoarthritis, unspecified: Secondary | ICD-10-CM | POA: Diagnosis not present

## 2018-12-19 DIAGNOSIS — I2510 Atherosclerotic heart disease of native coronary artery without angina pectoris: Secondary | ICD-10-CM | POA: Diagnosis not present

## 2018-12-19 DIAGNOSIS — I509 Heart failure, unspecified: Secondary | ICD-10-CM | POA: Diagnosis not present

## 2018-12-19 DIAGNOSIS — I110 Hypertensive heart disease with heart failure: Secondary | ICD-10-CM | POA: Diagnosis not present

## 2018-12-19 DIAGNOSIS — J449 Chronic obstructive pulmonary disease, unspecified: Secondary | ICD-10-CM | POA: Diagnosis not present

## 2018-12-19 DIAGNOSIS — E1151 Type 2 diabetes mellitus with diabetic peripheral angiopathy without gangrene: Secondary | ICD-10-CM | POA: Diagnosis not present

## 2018-12-19 DIAGNOSIS — E1140 Type 2 diabetes mellitus with diabetic neuropathy, unspecified: Secondary | ICD-10-CM | POA: Diagnosis not present

## 2018-12-19 DIAGNOSIS — M545 Low back pain: Secondary | ICD-10-CM | POA: Diagnosis not present

## 2018-12-20 DIAGNOSIS — I509 Heart failure, unspecified: Secondary | ICD-10-CM | POA: Diagnosis not present

## 2018-12-20 DIAGNOSIS — E1140 Type 2 diabetes mellitus with diabetic neuropathy, unspecified: Secondary | ICD-10-CM | POA: Diagnosis not present

## 2018-12-20 DIAGNOSIS — Z471 Aftercare following joint replacement surgery: Secondary | ICD-10-CM | POA: Diagnosis not present

## 2018-12-20 DIAGNOSIS — I110 Hypertensive heart disease with heart failure: Secondary | ICD-10-CM | POA: Diagnosis not present

## 2018-12-20 DIAGNOSIS — E1151 Type 2 diabetes mellitus with diabetic peripheral angiopathy without gangrene: Secondary | ICD-10-CM | POA: Diagnosis not present

## 2018-12-20 DIAGNOSIS — I2510 Atherosclerotic heart disease of native coronary artery without angina pectoris: Secondary | ICD-10-CM | POA: Diagnosis not present

## 2018-12-20 DIAGNOSIS — M545 Low back pain: Secondary | ICD-10-CM | POA: Diagnosis not present

## 2018-12-20 DIAGNOSIS — J449 Chronic obstructive pulmonary disease, unspecified: Secondary | ICD-10-CM | POA: Diagnosis not present

## 2018-12-20 DIAGNOSIS — M159 Polyosteoarthritis, unspecified: Secondary | ICD-10-CM | POA: Diagnosis not present

## 2018-12-22 ENCOUNTER — Ambulatory Visit (INDEPENDENT_AMBULATORY_CARE_PROVIDER_SITE_OTHER): Payer: Medicare HMO | Admitting: *Deleted

## 2018-12-22 ENCOUNTER — Telehealth: Payer: Self-pay

## 2018-12-22 DIAGNOSIS — J449 Chronic obstructive pulmonary disease, unspecified: Secondary | ICD-10-CM | POA: Diagnosis not present

## 2018-12-22 DIAGNOSIS — Z471 Aftercare following joint replacement surgery: Secondary | ICD-10-CM | POA: Diagnosis not present

## 2018-12-22 DIAGNOSIS — M159 Polyosteoarthritis, unspecified: Secondary | ICD-10-CM | POA: Diagnosis not present

## 2018-12-22 DIAGNOSIS — E1151 Type 2 diabetes mellitus with diabetic peripheral angiopathy without gangrene: Secondary | ICD-10-CM | POA: Diagnosis not present

## 2018-12-22 DIAGNOSIS — I255 Ischemic cardiomyopathy: Secondary | ICD-10-CM

## 2018-12-22 DIAGNOSIS — I110 Hypertensive heart disease with heart failure: Secondary | ICD-10-CM | POA: Diagnosis not present

## 2018-12-22 DIAGNOSIS — I2510 Atherosclerotic heart disease of native coronary artery without angina pectoris: Secondary | ICD-10-CM | POA: Diagnosis not present

## 2018-12-22 DIAGNOSIS — I509 Heart failure, unspecified: Secondary | ICD-10-CM | POA: Diagnosis not present

## 2018-12-22 DIAGNOSIS — M545 Low back pain: Secondary | ICD-10-CM | POA: Diagnosis not present

## 2018-12-22 DIAGNOSIS — E1140 Type 2 diabetes mellitus with diabetic neuropathy, unspecified: Secondary | ICD-10-CM | POA: Diagnosis not present

## 2018-12-22 LAB — CUP PACEART REMOTE DEVICE CHECK
Battery Remaining Longevity: 61 mo
Battery Remaining Percentage: 61 %
Battery Voltage: 2.93 V
Brady Statistic RV Percent Paced: 1 %
Date Time Interrogation Session: 20200702111704
HighPow Impedance: 63 Ohm
HighPow Impedance: 63 Ohm
Implantable Lead Implant Date: 20160115
Implantable Lead Location: 753860
Implantable Lead Model: 7122
Implantable Pulse Generator Implant Date: 20160115
Lead Channel Impedance Value: 340 Ohm
Lead Channel Sensing Intrinsic Amplitude: 4 mV
Lead Channel Setting Pacing Amplitude: 2.5 V
Lead Channel Setting Pacing Pulse Width: 0.5 ms
Lead Channel Setting Sensing Sensitivity: 0.5 mV
Pulse Gen Serial Number: 7233603

## 2018-12-22 NOTE — Telephone Encounter (Signed)
Spoke with patient to remind of missed remote transmission 

## 2018-12-26 DIAGNOSIS — E1140 Type 2 diabetes mellitus with diabetic neuropathy, unspecified: Secondary | ICD-10-CM | POA: Diagnosis not present

## 2018-12-26 DIAGNOSIS — Z471 Aftercare following joint replacement surgery: Secondary | ICD-10-CM | POA: Diagnosis not present

## 2018-12-26 DIAGNOSIS — M545 Low back pain: Secondary | ICD-10-CM | POA: Diagnosis not present

## 2018-12-26 DIAGNOSIS — E1151 Type 2 diabetes mellitus with diabetic peripheral angiopathy without gangrene: Secondary | ICD-10-CM | POA: Diagnosis not present

## 2018-12-26 DIAGNOSIS — I509 Heart failure, unspecified: Secondary | ICD-10-CM | POA: Diagnosis not present

## 2018-12-26 DIAGNOSIS — J449 Chronic obstructive pulmonary disease, unspecified: Secondary | ICD-10-CM | POA: Diagnosis not present

## 2018-12-26 DIAGNOSIS — I2510 Atherosclerotic heart disease of native coronary artery without angina pectoris: Secondary | ICD-10-CM | POA: Diagnosis not present

## 2018-12-26 DIAGNOSIS — I110 Hypertensive heart disease with heart failure: Secondary | ICD-10-CM | POA: Diagnosis not present

## 2018-12-26 DIAGNOSIS — M159 Polyosteoarthritis, unspecified: Secondary | ICD-10-CM | POA: Diagnosis not present

## 2018-12-27 DIAGNOSIS — M25551 Pain in right hip: Secondary | ICD-10-CM | POA: Diagnosis not present

## 2018-12-27 DIAGNOSIS — M545 Low back pain: Secondary | ICD-10-CM | POA: Diagnosis not present

## 2018-12-27 DIAGNOSIS — I2510 Atherosclerotic heart disease of native coronary artery without angina pectoris: Secondary | ICD-10-CM | POA: Diagnosis not present

## 2018-12-27 DIAGNOSIS — Z6832 Body mass index (BMI) 32.0-32.9, adult: Secondary | ICD-10-CM | POA: Diagnosis not present

## 2018-12-27 DIAGNOSIS — Z471 Aftercare following joint replacement surgery: Secondary | ICD-10-CM | POA: Diagnosis not present

## 2018-12-27 DIAGNOSIS — M159 Polyosteoarthritis, unspecified: Secondary | ICD-10-CM | POA: Diagnosis not present

## 2018-12-27 DIAGNOSIS — I509 Heart failure, unspecified: Secondary | ICD-10-CM | POA: Diagnosis not present

## 2018-12-27 DIAGNOSIS — E1151 Type 2 diabetes mellitus with diabetic peripheral angiopathy without gangrene: Secondary | ICD-10-CM | POA: Diagnosis not present

## 2018-12-27 DIAGNOSIS — I110 Hypertensive heart disease with heart failure: Secondary | ICD-10-CM | POA: Diagnosis not present

## 2018-12-27 DIAGNOSIS — E1140 Type 2 diabetes mellitus with diabetic neuropathy, unspecified: Secondary | ICD-10-CM | POA: Diagnosis not present

## 2018-12-27 DIAGNOSIS — J449 Chronic obstructive pulmonary disease, unspecified: Secondary | ICD-10-CM | POA: Diagnosis not present

## 2018-12-28 DIAGNOSIS — I509 Heart failure, unspecified: Secondary | ICD-10-CM | POA: Diagnosis not present

## 2018-12-28 DIAGNOSIS — E1140 Type 2 diabetes mellitus with diabetic neuropathy, unspecified: Secondary | ICD-10-CM | POA: Diagnosis not present

## 2018-12-28 DIAGNOSIS — E1151 Type 2 diabetes mellitus with diabetic peripheral angiopathy without gangrene: Secondary | ICD-10-CM | POA: Diagnosis not present

## 2018-12-28 DIAGNOSIS — I110 Hypertensive heart disease with heart failure: Secondary | ICD-10-CM | POA: Diagnosis not present

## 2018-12-28 DIAGNOSIS — J449 Chronic obstructive pulmonary disease, unspecified: Secondary | ICD-10-CM | POA: Diagnosis not present

## 2018-12-28 DIAGNOSIS — Z471 Aftercare following joint replacement surgery: Secondary | ICD-10-CM | POA: Diagnosis not present

## 2018-12-28 DIAGNOSIS — M159 Polyosteoarthritis, unspecified: Secondary | ICD-10-CM | POA: Diagnosis not present

## 2018-12-28 DIAGNOSIS — M545 Low back pain: Secondary | ICD-10-CM | POA: Diagnosis not present

## 2018-12-28 DIAGNOSIS — I2510 Atherosclerotic heart disease of native coronary artery without angina pectoris: Secondary | ICD-10-CM | POA: Diagnosis not present

## 2018-12-29 ENCOUNTER — Encounter: Payer: Self-pay | Admitting: Cardiology

## 2018-12-29 DIAGNOSIS — Z471 Aftercare following joint replacement surgery: Secondary | ICD-10-CM | POA: Diagnosis not present

## 2018-12-29 DIAGNOSIS — J449 Chronic obstructive pulmonary disease, unspecified: Secondary | ICD-10-CM | POA: Diagnosis not present

## 2018-12-29 DIAGNOSIS — E1140 Type 2 diabetes mellitus with diabetic neuropathy, unspecified: Secondary | ICD-10-CM | POA: Diagnosis not present

## 2018-12-29 DIAGNOSIS — I110 Hypertensive heart disease with heart failure: Secondary | ICD-10-CM | POA: Diagnosis not present

## 2018-12-29 DIAGNOSIS — I509 Heart failure, unspecified: Secondary | ICD-10-CM | POA: Diagnosis not present

## 2018-12-29 DIAGNOSIS — I2510 Atherosclerotic heart disease of native coronary artery without angina pectoris: Secondary | ICD-10-CM | POA: Diagnosis not present

## 2018-12-29 DIAGNOSIS — M545 Low back pain: Secondary | ICD-10-CM | POA: Diagnosis not present

## 2018-12-29 DIAGNOSIS — M159 Polyosteoarthritis, unspecified: Secondary | ICD-10-CM | POA: Diagnosis not present

## 2018-12-29 DIAGNOSIS — E1151 Type 2 diabetes mellitus with diabetic peripheral angiopathy without gangrene: Secondary | ICD-10-CM | POA: Diagnosis not present

## 2018-12-29 NOTE — Progress Notes (Signed)
Remote ICD transmission.   

## 2019-01-01 DIAGNOSIS — E1151 Type 2 diabetes mellitus with diabetic peripheral angiopathy without gangrene: Secondary | ICD-10-CM | POA: Diagnosis not present

## 2019-01-01 DIAGNOSIS — J449 Chronic obstructive pulmonary disease, unspecified: Secondary | ICD-10-CM | POA: Diagnosis not present

## 2019-01-01 DIAGNOSIS — I509 Heart failure, unspecified: Secondary | ICD-10-CM | POA: Diagnosis not present

## 2019-01-01 DIAGNOSIS — Z471 Aftercare following joint replacement surgery: Secondary | ICD-10-CM | POA: Diagnosis not present

## 2019-01-01 DIAGNOSIS — E1140 Type 2 diabetes mellitus with diabetic neuropathy, unspecified: Secondary | ICD-10-CM | POA: Diagnosis not present

## 2019-01-01 DIAGNOSIS — M159 Polyosteoarthritis, unspecified: Secondary | ICD-10-CM | POA: Diagnosis not present

## 2019-01-01 DIAGNOSIS — I2510 Atherosclerotic heart disease of native coronary artery without angina pectoris: Secondary | ICD-10-CM | POA: Diagnosis not present

## 2019-01-01 DIAGNOSIS — I110 Hypertensive heart disease with heart failure: Secondary | ICD-10-CM | POA: Diagnosis not present

## 2019-01-01 DIAGNOSIS — M545 Low back pain: Secondary | ICD-10-CM | POA: Diagnosis not present

## 2019-01-02 DIAGNOSIS — E1140 Type 2 diabetes mellitus with diabetic neuropathy, unspecified: Secondary | ICD-10-CM | POA: Diagnosis not present

## 2019-01-02 DIAGNOSIS — M545 Low back pain: Secondary | ICD-10-CM | POA: Diagnosis not present

## 2019-01-02 DIAGNOSIS — I509 Heart failure, unspecified: Secondary | ICD-10-CM | POA: Diagnosis not present

## 2019-01-02 DIAGNOSIS — Z471 Aftercare following joint replacement surgery: Secondary | ICD-10-CM | POA: Diagnosis not present

## 2019-01-02 DIAGNOSIS — E1151 Type 2 diabetes mellitus with diabetic peripheral angiopathy without gangrene: Secondary | ICD-10-CM | POA: Diagnosis not present

## 2019-01-02 DIAGNOSIS — I110 Hypertensive heart disease with heart failure: Secondary | ICD-10-CM | POA: Diagnosis not present

## 2019-01-02 DIAGNOSIS — M159 Polyosteoarthritis, unspecified: Secondary | ICD-10-CM | POA: Diagnosis not present

## 2019-01-02 DIAGNOSIS — I2510 Atherosclerotic heart disease of native coronary artery without angina pectoris: Secondary | ICD-10-CM | POA: Diagnosis not present

## 2019-01-02 DIAGNOSIS — J449 Chronic obstructive pulmonary disease, unspecified: Secondary | ICD-10-CM | POA: Diagnosis not present

## 2019-01-04 DIAGNOSIS — I110 Hypertensive heart disease with heart failure: Secondary | ICD-10-CM | POA: Diagnosis not present

## 2019-01-04 DIAGNOSIS — I509 Heart failure, unspecified: Secondary | ICD-10-CM | POA: Diagnosis not present

## 2019-01-04 DIAGNOSIS — E1151 Type 2 diabetes mellitus with diabetic peripheral angiopathy without gangrene: Secondary | ICD-10-CM | POA: Diagnosis not present

## 2019-01-04 DIAGNOSIS — I2510 Atherosclerotic heart disease of native coronary artery without angina pectoris: Secondary | ICD-10-CM | POA: Diagnosis not present

## 2019-01-04 DIAGNOSIS — M159 Polyosteoarthritis, unspecified: Secondary | ICD-10-CM | POA: Diagnosis not present

## 2019-01-04 DIAGNOSIS — Z471 Aftercare following joint replacement surgery: Secondary | ICD-10-CM | POA: Diagnosis not present

## 2019-01-04 DIAGNOSIS — J449 Chronic obstructive pulmonary disease, unspecified: Secondary | ICD-10-CM | POA: Diagnosis not present

## 2019-01-04 DIAGNOSIS — M545 Low back pain: Secondary | ICD-10-CM | POA: Diagnosis not present

## 2019-01-04 DIAGNOSIS — E1140 Type 2 diabetes mellitus with diabetic neuropathy, unspecified: Secondary | ICD-10-CM | POA: Diagnosis not present

## 2019-01-08 ENCOUNTER — Other Ambulatory Visit (HOSPITAL_COMMUNITY): Payer: Self-pay | Admitting: Internal Medicine

## 2019-01-10 DIAGNOSIS — M159 Polyosteoarthritis, unspecified: Secondary | ICD-10-CM | POA: Diagnosis not present

## 2019-01-10 DIAGNOSIS — I2510 Atherosclerotic heart disease of native coronary artery without angina pectoris: Secondary | ICD-10-CM | POA: Diagnosis not present

## 2019-01-10 DIAGNOSIS — I509 Heart failure, unspecified: Secondary | ICD-10-CM | POA: Diagnosis not present

## 2019-01-10 DIAGNOSIS — E1151 Type 2 diabetes mellitus with diabetic peripheral angiopathy without gangrene: Secondary | ICD-10-CM | POA: Diagnosis not present

## 2019-01-10 DIAGNOSIS — I110 Hypertensive heart disease with heart failure: Secondary | ICD-10-CM | POA: Diagnosis not present

## 2019-01-10 DIAGNOSIS — Z471 Aftercare following joint replacement surgery: Secondary | ICD-10-CM | POA: Diagnosis not present

## 2019-01-10 DIAGNOSIS — M545 Low back pain: Secondary | ICD-10-CM | POA: Diagnosis not present

## 2019-01-10 DIAGNOSIS — E1140 Type 2 diabetes mellitus with diabetic neuropathy, unspecified: Secondary | ICD-10-CM | POA: Diagnosis not present

## 2019-01-10 DIAGNOSIS — J449 Chronic obstructive pulmonary disease, unspecified: Secondary | ICD-10-CM | POA: Diagnosis not present

## 2019-01-13 DIAGNOSIS — Z96641 Presence of right artificial hip joint: Secondary | ICD-10-CM | POA: Diagnosis not present

## 2019-01-13 DIAGNOSIS — Z471 Aftercare following joint replacement surgery: Secondary | ICD-10-CM | POA: Diagnosis not present

## 2019-01-17 DIAGNOSIS — I110 Hypertensive heart disease with heart failure: Secondary | ICD-10-CM | POA: Diagnosis not present

## 2019-01-17 DIAGNOSIS — J449 Chronic obstructive pulmonary disease, unspecified: Secondary | ICD-10-CM | POA: Diagnosis not present

## 2019-01-17 DIAGNOSIS — M159 Polyosteoarthritis, unspecified: Secondary | ICD-10-CM | POA: Diagnosis not present

## 2019-01-17 DIAGNOSIS — I509 Heart failure, unspecified: Secondary | ICD-10-CM | POA: Diagnosis not present

## 2019-01-17 DIAGNOSIS — E1151 Type 2 diabetes mellitus with diabetic peripheral angiopathy without gangrene: Secondary | ICD-10-CM | POA: Diagnosis not present

## 2019-01-17 DIAGNOSIS — I2510 Atherosclerotic heart disease of native coronary artery without angina pectoris: Secondary | ICD-10-CM | POA: Diagnosis not present

## 2019-01-17 DIAGNOSIS — E1140 Type 2 diabetes mellitus with diabetic neuropathy, unspecified: Secondary | ICD-10-CM | POA: Diagnosis not present

## 2019-01-17 DIAGNOSIS — M545 Low back pain: Secondary | ICD-10-CM | POA: Diagnosis not present

## 2019-01-17 DIAGNOSIS — Z471 Aftercare following joint replacement surgery: Secondary | ICD-10-CM | POA: Diagnosis not present

## 2019-01-23 DIAGNOSIS — I509 Heart failure, unspecified: Secondary | ICD-10-CM | POA: Diagnosis not present

## 2019-01-23 DIAGNOSIS — I2510 Atherosclerotic heart disease of native coronary artery without angina pectoris: Secondary | ICD-10-CM | POA: Diagnosis not present

## 2019-01-23 DIAGNOSIS — E1151 Type 2 diabetes mellitus with diabetic peripheral angiopathy without gangrene: Secondary | ICD-10-CM | POA: Diagnosis not present

## 2019-01-23 DIAGNOSIS — Z471 Aftercare following joint replacement surgery: Secondary | ICD-10-CM | POA: Diagnosis not present

## 2019-01-23 DIAGNOSIS — I110 Hypertensive heart disease with heart failure: Secondary | ICD-10-CM | POA: Diagnosis not present

## 2019-01-23 DIAGNOSIS — M159 Polyosteoarthritis, unspecified: Secondary | ICD-10-CM | POA: Diagnosis not present

## 2019-01-23 DIAGNOSIS — M545 Low back pain: Secondary | ICD-10-CM | POA: Diagnosis not present

## 2019-01-23 DIAGNOSIS — J449 Chronic obstructive pulmonary disease, unspecified: Secondary | ICD-10-CM | POA: Diagnosis not present

## 2019-01-23 DIAGNOSIS — E1140 Type 2 diabetes mellitus with diabetic neuropathy, unspecified: Secondary | ICD-10-CM | POA: Diagnosis not present

## 2019-01-25 ENCOUNTER — Other Ambulatory Visit (HOSPITAL_COMMUNITY): Payer: Self-pay | Admitting: Internal Medicine

## 2019-01-25 DIAGNOSIS — J449 Chronic obstructive pulmonary disease, unspecified: Secondary | ICD-10-CM | POA: Diagnosis not present

## 2019-01-25 DIAGNOSIS — M159 Polyosteoarthritis, unspecified: Secondary | ICD-10-CM | POA: Diagnosis not present

## 2019-01-25 DIAGNOSIS — M545 Low back pain: Secondary | ICD-10-CM | POA: Diagnosis not present

## 2019-01-25 DIAGNOSIS — I110 Hypertensive heart disease with heart failure: Secondary | ICD-10-CM | POA: Diagnosis not present

## 2019-01-25 DIAGNOSIS — E1140 Type 2 diabetes mellitus with diabetic neuropathy, unspecified: Secondary | ICD-10-CM | POA: Diagnosis not present

## 2019-01-25 DIAGNOSIS — I509 Heart failure, unspecified: Secondary | ICD-10-CM | POA: Diagnosis not present

## 2019-01-25 DIAGNOSIS — Z471 Aftercare following joint replacement surgery: Secondary | ICD-10-CM | POA: Diagnosis not present

## 2019-01-25 DIAGNOSIS — I2510 Atherosclerotic heart disease of native coronary artery without angina pectoris: Secondary | ICD-10-CM | POA: Diagnosis not present

## 2019-01-25 DIAGNOSIS — R32 Unspecified urinary incontinence: Secondary | ICD-10-CM | POA: Diagnosis not present

## 2019-01-25 DIAGNOSIS — E1151 Type 2 diabetes mellitus with diabetic peripheral angiopathy without gangrene: Secondary | ICD-10-CM | POA: Diagnosis not present

## 2019-01-29 DIAGNOSIS — M159 Polyosteoarthritis, unspecified: Secondary | ICD-10-CM | POA: Diagnosis not present

## 2019-01-29 DIAGNOSIS — E1140 Type 2 diabetes mellitus with diabetic neuropathy, unspecified: Secondary | ICD-10-CM | POA: Diagnosis not present

## 2019-01-29 DIAGNOSIS — Z471 Aftercare following joint replacement surgery: Secondary | ICD-10-CM | POA: Diagnosis not present

## 2019-01-29 DIAGNOSIS — M545 Low back pain: Secondary | ICD-10-CM | POA: Diagnosis not present

## 2019-01-29 DIAGNOSIS — E1151 Type 2 diabetes mellitus with diabetic peripheral angiopathy without gangrene: Secondary | ICD-10-CM | POA: Diagnosis not present

## 2019-01-29 DIAGNOSIS — J449 Chronic obstructive pulmonary disease, unspecified: Secondary | ICD-10-CM | POA: Diagnosis not present

## 2019-01-29 DIAGNOSIS — I2510 Atherosclerotic heart disease of native coronary artery without angina pectoris: Secondary | ICD-10-CM | POA: Diagnosis not present

## 2019-01-29 DIAGNOSIS — I509 Heart failure, unspecified: Secondary | ICD-10-CM | POA: Diagnosis not present

## 2019-01-29 DIAGNOSIS — I110 Hypertensive heart disease with heart failure: Secondary | ICD-10-CM | POA: Diagnosis not present

## 2019-01-31 DIAGNOSIS — I2510 Atherosclerotic heart disease of native coronary artery without angina pectoris: Secondary | ICD-10-CM | POA: Diagnosis not present

## 2019-01-31 DIAGNOSIS — J449 Chronic obstructive pulmonary disease, unspecified: Secondary | ICD-10-CM | POA: Diagnosis not present

## 2019-01-31 DIAGNOSIS — Z471 Aftercare following joint replacement surgery: Secondary | ICD-10-CM | POA: Diagnosis not present

## 2019-01-31 DIAGNOSIS — I110 Hypertensive heart disease with heart failure: Secondary | ICD-10-CM | POA: Diagnosis not present

## 2019-01-31 DIAGNOSIS — E1140 Type 2 diabetes mellitus with diabetic neuropathy, unspecified: Secondary | ICD-10-CM | POA: Diagnosis not present

## 2019-01-31 DIAGNOSIS — M159 Polyosteoarthritis, unspecified: Secondary | ICD-10-CM | POA: Diagnosis not present

## 2019-01-31 DIAGNOSIS — I509 Heart failure, unspecified: Secondary | ICD-10-CM | POA: Diagnosis not present

## 2019-01-31 DIAGNOSIS — E1151 Type 2 diabetes mellitus with diabetic peripheral angiopathy without gangrene: Secondary | ICD-10-CM | POA: Diagnosis not present

## 2019-01-31 DIAGNOSIS — M545 Low back pain: Secondary | ICD-10-CM | POA: Diagnosis not present

## 2019-02-02 ENCOUNTER — Encounter (HOSPITAL_COMMUNITY): Payer: Self-pay

## 2019-02-02 ENCOUNTER — Ambulatory Visit (HOSPITAL_COMMUNITY)
Admission: RE | Admit: 2019-02-02 | Discharge: 2019-02-02 | Disposition: A | Discharge status: Home / Self Care | Payer: Medicare HMO | Source: Ambulatory Visit | Attending: Internal Medicine | Admitting: Internal Medicine

## 2019-02-02 ENCOUNTER — Other Ambulatory Visit: Payer: Self-pay

## 2019-02-02 VITALS — BP 128/90 | HR 85 | Wt 214.4 lb

## 2019-02-02 DIAGNOSIS — M25551 Pain in right hip: Secondary | ICD-10-CM | POA: Insufficient documentation

## 2019-02-02 DIAGNOSIS — I5022 Chronic systolic (congestive) heart failure: Secondary | ICD-10-CM | POA: Diagnosis not present

## 2019-02-02 DIAGNOSIS — I255 Ischemic cardiomyopathy: Secondary | ICD-10-CM | POA: Diagnosis not present

## 2019-02-02 DIAGNOSIS — N183 Chronic kidney disease, stage 3 unspecified: Secondary | ICD-10-CM

## 2019-02-02 DIAGNOSIS — Z951 Presence of aortocoronary bypass graft: Secondary | ICD-10-CM | POA: Diagnosis not present

## 2019-02-02 DIAGNOSIS — I25810 Atherosclerosis of coronary artery bypass graft(s) without angina pectoris: Secondary | ICD-10-CM | POA: Insufficient documentation

## 2019-02-02 DIAGNOSIS — Z7982 Long term (current) use of aspirin: Secondary | ICD-10-CM | POA: Insufficient documentation

## 2019-02-02 DIAGNOSIS — J439 Emphysema, unspecified: Secondary | ICD-10-CM | POA: Insufficient documentation

## 2019-02-02 DIAGNOSIS — Z95810 Presence of automatic (implantable) cardiac defibrillator: Secondary | ICD-10-CM | POA: Insufficient documentation

## 2019-02-02 DIAGNOSIS — F17210 Nicotine dependence, cigarettes, uncomplicated: Secondary | ICD-10-CM | POA: Diagnosis not present

## 2019-02-02 DIAGNOSIS — Z79899 Other long term (current) drug therapy: Secondary | ICD-10-CM | POA: Diagnosis not present

## 2019-02-02 DIAGNOSIS — Z720 Tobacco use: Secondary | ICD-10-CM

## 2019-02-02 DIAGNOSIS — Z7951 Long term (current) use of inhaled steroids: Secondary | ICD-10-CM | POA: Diagnosis not present

## 2019-02-02 DIAGNOSIS — I739 Peripheral vascular disease, unspecified: Secondary | ICD-10-CM | POA: Diagnosis not present

## 2019-02-02 DIAGNOSIS — E1122 Type 2 diabetes mellitus with diabetic chronic kidney disease: Secondary | ICD-10-CM | POA: Diagnosis not present

## 2019-02-02 DIAGNOSIS — E785 Hyperlipidemia, unspecified: Secondary | ICD-10-CM | POA: Diagnosis not present

## 2019-02-02 DIAGNOSIS — Z791 Long term (current) use of non-steroidal anti-inflammatories (NSAID): Secondary | ICD-10-CM | POA: Diagnosis not present

## 2019-02-02 DIAGNOSIS — M109 Gout, unspecified: Secondary | ICD-10-CM | POA: Diagnosis not present

## 2019-02-02 DIAGNOSIS — Z96641 Presence of right artificial hip joint: Secondary | ICD-10-CM | POA: Diagnosis not present

## 2019-02-02 DIAGNOSIS — I130 Hypertensive heart and chronic kidney disease with heart failure and stage 1 through stage 4 chronic kidney disease, or unspecified chronic kidney disease: Secondary | ICD-10-CM | POA: Insufficient documentation

## 2019-02-02 DIAGNOSIS — Z8249 Family history of ischemic heart disease and other diseases of the circulatory system: Secondary | ICD-10-CM | POA: Diagnosis not present

## 2019-02-02 DIAGNOSIS — F17200 Nicotine dependence, unspecified, uncomplicated: Secondary | ICD-10-CM | POA: Diagnosis not present

## 2019-02-02 DIAGNOSIS — I10 Essential (primary) hypertension: Secondary | ICD-10-CM

## 2019-02-02 DIAGNOSIS — E1151 Type 2 diabetes mellitus with diabetic peripheral angiopathy without gangrene: Secondary | ICD-10-CM | POA: Diagnosis not present

## 2019-02-02 DIAGNOSIS — Z833 Family history of diabetes mellitus: Secondary | ICD-10-CM | POA: Insufficient documentation

## 2019-02-02 DIAGNOSIS — Z880 Allergy status to penicillin: Secondary | ICD-10-CM | POA: Diagnosis not present

## 2019-02-02 LAB — BASIC METABOLIC PANEL
Anion gap: 11 (ref 5–15)
BUN: 16 mg/dL (ref 8–23)
CO2: 22 mmol/L (ref 22–32)
Calcium: 9.5 mg/dL (ref 8.9–10.3)
Chloride: 107 mmol/L (ref 98–111)
Creatinine, Ser: 1.54 mg/dL — ABNORMAL HIGH (ref 0.61–1.24)
GFR calc Af Amer: 53 mL/min — ABNORMAL LOW (ref >60)
GFR calc non Af Amer: 46 mL/min — ABNORMAL LOW (ref >60)
Glucose, Bld: 89 mg/dL (ref 70–99)
Potassium: 3.8 mmol/L (ref 3.5–5.1)
Sodium: 140 mmol/L (ref 135–145)

## 2019-02-02 MED ORDER — ISOSORBIDE MONONITRATE ER 30 MG PO TB24: 30.0000 mg | ORAL_TABLET | Freq: Every day | ORAL | 3 refills | Status: DC

## 2019-02-02 MED ORDER — HYDRALAZINE HCL 25 MG PO TABS: 12.5000 mg | ORAL_TABLET | Freq: Three times a day (TID) | ORAL | 3 refills | Status: DC

## 2019-02-02 NOTE — Progress Notes (Signed)
Heart Failure  Note  ID:  [name] [name], DOB 1951/01/11, MRN [id number]  Location: Home  Provider location: [address] [place] Type of Visit: Established patient   PCP:  [name], PA-C  Cardiologist:  Loralie Champagne, MD  Chief Complaint: HF follow up   History of Present Illness: [name] [name] is a 68 y.o. male with a history of COPD, HTN, 3V CAD s/p CABG x 5 (CABG 04/2013: LIMA to LAD, SVG to second diagonal, SVG to ramus intermediate and obtuse marginal 2, SVG to posterior descending), ischemic cardiomyopathy, chronic systolic HF (EF 65%), COPD, DM2, PVD and tobacco abuse.  Lexiscan Cardiolite in 12/19 showed inferior infarct, no ischemia. Echo in 12/19 showed EF 35%, mild LVH, mildly decreased RV systolic function. ABIs in 12/19 were stable compared to the past.  S/P 11/30/18  R hip replacement.    Today he returns for HF follow up.Overall feeling fine. Walking with a cane. Able to walk 1/2 miles 3 days a week. Rarely short of breath. Denies PND/Orthopnea. Had chest heaviness one day last week. Appetite ok. No fever or chills. Weight at home  210-211pounds. Taking all medications. Smoking 1/2 PPD. Lives alone.   Pt denies symptoms of cough, fevers, chills, or new SOB worrisome for COVID 19.    Past Medical History:  Diagnosis Date  . AICD (automatic cardioverter/defibrillator) present    reports no shocks   . CAD (coronary artery disease)    a. 04/2013 Cath: LM 10, LAD 40p, 88m, D1 50p, LCX 60p, 32m/100m, OM1 50, OM2 100 L-L collats, RCA 100p;  b. CABG x 5: LIMA->LAD, VG->D2, VG->RI->OM2, VG->PDA.  Marland Kitchen Chronic systolic CHF (congestive heart failure) ([place])    a. 04/2013 Echo: EF 15-20%, diff HK, sev HK of inf and ant myocardium, dilated LA,  b. EF 20-25% with restrictive filling pattern, mod RV dysfx, mild MR (10/10/2013);  c.  Echo 12/15: EF 25-30%  . COPD (chronic obstructive pulmonary disease) ([place])   . Diabetes mellitus without complication ([place])    unsure  what type but was dx as an adult, takes no meds, check his cbg at home 2 times a week   . Emphysema   . Gout   . Hyperlipidemia   . Hypertension   . Ischemic cardiomyopathy   . Peripheral neuropathy    reprots bilateral lower extemity paresthesia   . PVD (peripheral vascular disease) ([place])    a. (10/2013) ABIs: RIGHT 0.63, Waveforms: monophasic;  LEFT 0.78, Waveforms: monophasic  . Tobacco abuse    30+ pack-year history  . Transaminitis    a. 04/2013 Abd U/S and CT unremarkable, hepatitis panel neg -->felt to be 2/2 acute R heart failure.   Past Surgical History:  Procedure Laterality Date  . APPENDECTOMY  as a child  . CARDIAC CATHETERIZATION  04/08/2015   Procedure: Left Heart Cath and Cors/Grafts Angiography;  Surgeon: Peter M Martinique, MD;  Location: [facility];  Service: Cardiovascular;;  . CORONARY ARTERY BYPASS GRAFT N/A 05/05/2013   Procedure: CORONARY ARTERY BYPASS GRAFTING (CABG) TIMES FIVE USING LEFT INTERNAL MAMMARY ARTERY AND RIGHT AND LEFT SAPHENOUS LEG VEIN HARVESTED ENDOSCOPICALLY;  Surgeon: Melrose Nakayama, MD;  Location: [facility];  Service: Open Heart Surgery;  Laterality: N/A;  . IMPLANTABLE CARDIOVERTER DEFIBRILLATOR IMPLANT N/A 07/06/2014   Procedure: IMPLANTABLE CARDIOVERTER DEFIBRILLATOR IMPLANT;  Surgeon: Deboraha Sprang, MD;  Location: [facility] C[facility] LAB;  Service: Cardiovascular;  Laterality: N/A;  . LEFT AND RIGHT HEART CATHETERIZATION  WITH CORONARY ANGIOGRAM N/A 04/28/2013   Procedure: LEFT AND RIGHT HEART CATHETERIZATION WITH CORONARY ANGIOGRAM;  Surgeon: Burnell Blanks, MD;  Location: [facility] CATH LAB;  Service: Cardiovascular;  Laterality: N/A;  . MULTIPLE EXTRACTIONS WITH ALVEOLOPLASTY N/A 05/03/2013   Procedure: Extraction of tooth #s 3,4,5,6,8,9,27 with alveoloplast and maxillary left osseous tuberosity reduction;  Surgeon: Lenn Cal, DDS;  Location: [facility];  Service: Oral Surgery;  Laterality: N/A;  . TOTAL HIP ARTHROPLASTY Right 11/30/2018    Procedure: TOTAL HIP ARTHROPLASTY ANTERIOR APPROACH;  Surgeon: Rod Can, MD;  Location: [facility];  Service: Orthopedics;  Laterality: Right;     Current Outpatient Medications  Medication Sig Dispense Refill  . albuterol (PROVENTIL HFA;VENTOLIN HFA) 108 (90 BASE) MCG/ACT inhaler Inhale 1 puff into the lungs every 6 (six) hours as needed for wheezing or shortness of breath.    . allopurinol (ZYLOPRIM) 300 MG tablet Take 300 mg by mouth daily.    Marland Kitchen aspirin 81 MG chewable tablet Chew 1 tablet (81 mg total) by mouth 2 (two) times daily. 60 tablet 1  . atorvastatin (LIPITOR) 20 MG tablet Take 20 mg by mouth daily.    Marland Kitchen azelastine (OPTIVAR) 0.05 % ophthalmic solution Place 1 drop into the left eye 2 (two) times daily.   0  . b complex vitamins tablet Take 1 tablet by mouth daily.    . budesonide-formoterol (SYMBICORT) 80-4.5 MCG/ACT inhaler Inhale 2 puffs into the lungs 2 (two) times a day.     . carvedilol (COREG) 6.25 MG tablet Take 6.25 mg by mouth 3 (three) times daily.    . colchicine (COLCRYS) 0.6 MG tablet Take 0.6 mg by mouth daily as needed (gout).    . cyclobenzaprine (FLEXERIL) 5 MG tablet Take 5 mg by mouth 3 (three) times daily as needed for muscle spasms.    Marland Kitchen docusate sodium (COLACE) 100 MG capsule Take 1 capsule (100 mg total) by mouth 2 (two) times daily. 60 capsule 1  . furosemide (LASIX) 40 MG tablet Take 40 mg by mouth daily.    Marland Kitchen HYDROcodone-acetaminophen (NORCO/VICODIN) 5-325 MG tablet Take 1-2 tablets by mouth every 4 (four) hours as needed for moderate pain (pain score 4-6). 30 tablet 0  . ibuprofen (ADVIL) 200 MG tablet Take 200 mg by mouth every 6 (six) hours as needed for moderate pain.    Marland Kitchen ibuprofen (ADVIL) 800 MG tablet     . omeprazole (PRILOSEC) 20 MG capsule Take 20 mg by mouth daily.    . ondansetron (ZOFRAN) 4 MG tablet Take 1 tablet (4 mg total) by mouth every 6 (six) hours as needed for nausea. 20 tablet 0  . sacubitril-valsartan (ENTRESTO) 24-26 MG Take 1  tablet by mouth 2 (two) times daily.    Marland Kitchen senna (SENOKOT) 8.6 MG TABS tablet Take 1 tablet (8.6 mg total) by mouth 2 (two) times daily. 120 each 0  . spironolactone (ALDACTONE) 25 MG tablet TAKE 1 TABLET BY MOUTH EVERY DAY 90 tablet 3  . tiotropium (SPIRIVA) 18 MCG inhalation capsule Place 18 mcg into inhaler and inhale daily.     No current facility-administered medications for this encounter.     Allergies:   Penicillins   Social History:  The patient  reports that he has been smoking cigarettes. He has a 30.00 pack-year smoking history. He has never used smokeless tobacco. He reports current drug use. Drug: Marijuana. He reports that he does not drink alcohol.   Family History:  The patient's family history  includes Diabetes in his mother; Heart attack in an other family member; Hypertension in an other family member.   ROS:  Please see the history of present illness.   All other systems are personally reviewed and negative.  Vitals:   02/02/19 1123  BP: 128/90  Pulse: 85  SpO2: 99%      Exam:   General:  No resp difficulty. Ambulated in the clinic with a cane.  HEENT: normal Neck: supple. no JVD. Carotids 2+ bilat; no bruits. No lymphadenopathy or thryomegaly appreciated. Cor: PMI nondisplaced. Regular rate & rhythm. No rubs, gallops or murmurs. Lungs: clear Abdomen: soft, nontender, nondistended. No hepatosplenomegaly. No bruits or masses. Good bowel sounds. Extremities: no cyanosis, clubbing, rash, edema Neuro: alert & orientedx3, cranial nerves grossly intact. moves all 4 extremities w/o difficulty. Affect pleasant  Recent Labs: 12/01/2018: BUN 31; Creatinine, Ser 1.99; Hemoglobin 11.0; Platelets 148; Potassium 3.7; Sodium 137  Personally reviewed   Wt Readings from Last 3 Encounters:  02/02/19 97.3 kg (214 lb 6.4 oz)  11/30/18 95.7 kg (211 lb)  11/23/18 95.7 kg (211 lb)    ASSESSMENT AND PLAN:  1. CAD s/p CABG: Had cath in 10/16 with patent grafts, medical  management.Cardiolite in 12/19 showed no ischemia (just prior infarction). - Continue ASA 81 and statin.Good lipids in 12/19. - No s/s ischemia.  2. Chronic systolic HF: Ischemic cardiomyopathy, [employer] Jude ICD. Echo12/19 with EF35%.  -NYHA class II symptoms. Volume sounds stable.  - Continue lasix 40 mg daily.  - Continue Coreg 6.25 mg twice a day. Will not increase with fatigue. - Continue Entresto 24-26  mg BID - Continue spironolactone 25 mg daily. - Add hydralazine 12.5 mg three times a day  - Add imdur 30 mg daily-  3. COPD/smoking:  Discussed cessation. .  4. PAD: Stable claudicationand stable ABIs in 12/19. He tries to walk through the pain. - Continue ASA and statin. No change. 5. CKD 3 Check BMET today.  I have asked him to avoid NSAIDs due to CKD. 6. R hip pain: S/P R THR 7. Tobacco Abuse Discussed cessation.   Follow up 6-8 weeks with Dr Aundra Dubin.   Darrick Grinder, NP  02/02/2019 11:41 AM   [facility] [address] Heart and [place] [address] [phone] (office) [phone] (fax)

## 2019-02-02 NOTE — Patient Instructions (Signed)
START Hydralazine 12.5 mg, one half tab three times daily START Imdur 30 mg one tab daily  Labs today We will only contact you if something comes back abnormal or we need to make some changes. Otherwise no news is good news!  Your physician recommends that you schedule a follow-up appointment in: 6 weeks with Dr Aundra Dubin   Do the following things EVERYDAY: 1) Weigh yourself in the morning before breakfast. Write it down and keep it in a log. 2) Take your medicines as prescribed 3) Eat low salt foods-Limit salt (sodium) to 2000 mg per day.  4) Stay as active as you can everyday 5) Limit all fluids for the day to less than 2 liters  At the [facility], you and your health needs are our priority. As part of our continuing mission to provide you with exceptional heart care, we have created designated Provider Care Teams. These Care Teams include your primary Cardiologist (physician) and Advanced Practice Providers (APPs- Physician Assistants and Nurse Practitioners) who all work together to provide you with the care you need, when you need it.   You may see any of the following providers on your designated Care Team at your next follow up: Marland Kitchen Dr Glori Bickers . Dr Loralie Champagne . Darrick Grinder, NP   Please be sure to bring in all your medications bottles to every appointment.

## 2019-02-03 DIAGNOSIS — E1151 Type 2 diabetes mellitus with diabetic peripheral angiopathy without gangrene: Secondary | ICD-10-CM | POA: Diagnosis not present

## 2019-02-03 DIAGNOSIS — I25119 Atherosclerotic heart disease of native coronary artery with unspecified angina pectoris: Secondary | ICD-10-CM | POA: Diagnosis not present

## 2019-02-03 DIAGNOSIS — M109 Gout, unspecified: Secondary | ICD-10-CM | POA: Diagnosis not present

## 2019-02-03 DIAGNOSIS — I2510 Atherosclerotic heart disease of native coronary artery without angina pectoris: Secondary | ICD-10-CM | POA: Diagnosis not present

## 2019-02-03 DIAGNOSIS — I10 Essential (primary) hypertension: Secondary | ICD-10-CM | POA: Diagnosis not present

## 2019-02-03 DIAGNOSIS — J449 Chronic obstructive pulmonary disease, unspecified: Secondary | ICD-10-CM | POA: Diagnosis not present

## 2019-02-03 DIAGNOSIS — E1140 Type 2 diabetes mellitus with diabetic neuropathy, unspecified: Secondary | ICD-10-CM | POA: Diagnosis not present

## 2019-02-03 DIAGNOSIS — N183 Chronic kidney disease, stage 3 (moderate): Secondary | ICD-10-CM | POA: Diagnosis not present

## 2019-02-03 DIAGNOSIS — I739 Peripheral vascular disease, unspecified: Secondary | ICD-10-CM | POA: Diagnosis not present

## 2019-02-03 DIAGNOSIS — E7800 Pure hypercholesterolemia, unspecified: Secondary | ICD-10-CM | POA: Diagnosis not present

## 2019-02-03 DIAGNOSIS — I110 Hypertensive heart disease with heart failure: Secondary | ICD-10-CM | POA: Diagnosis not present

## 2019-02-03 DIAGNOSIS — E119 Type 2 diabetes mellitus without complications: Secondary | ICD-10-CM | POA: Diagnosis not present

## 2019-02-03 DIAGNOSIS — I509 Heart failure, unspecified: Secondary | ICD-10-CM | POA: Diagnosis not present

## 2019-02-08 DIAGNOSIS — M109 Gout, unspecified: Secondary | ICD-10-CM | POA: Diagnosis not present

## 2019-02-08 DIAGNOSIS — I130 Hypertensive heart and chronic kidney disease with heart failure and stage 1 through stage 4 chronic kidney disease, or unspecified chronic kidney disease: Secondary | ICD-10-CM | POA: Diagnosis not present

## 2019-02-08 DIAGNOSIS — E1140 Type 2 diabetes mellitus with diabetic neuropathy, unspecified: Secondary | ICD-10-CM | POA: Diagnosis not present

## 2019-02-08 DIAGNOSIS — E1151 Type 2 diabetes mellitus with diabetic peripheral angiopathy without gangrene: Secondary | ICD-10-CM | POA: Diagnosis not present

## 2019-02-08 DIAGNOSIS — I509 Heart failure, unspecified: Secondary | ICD-10-CM | POA: Diagnosis not present

## 2019-02-08 DIAGNOSIS — I2510 Atherosclerotic heart disease of native coronary artery without angina pectoris: Secondary | ICD-10-CM | POA: Diagnosis not present

## 2019-02-08 DIAGNOSIS — J449 Chronic obstructive pulmonary disease, unspecified: Secondary | ICD-10-CM | POA: Diagnosis not present

## 2019-02-08 DIAGNOSIS — E7800 Pure hypercholesterolemia, unspecified: Secondary | ICD-10-CM | POA: Diagnosis not present

## 2019-02-08 DIAGNOSIS — N183 Chronic kidney disease, stage 3 (moderate): Secondary | ICD-10-CM | POA: Diagnosis not present

## 2019-02-14 ENCOUNTER — Telehealth (HOSPITAL_COMMUNITY): Payer: Self-pay

## 2019-02-14 ENCOUNTER — Emergency Department (HOSPITAL_COMMUNITY): Payer: Medicare HMO

## 2019-02-14 ENCOUNTER — Emergency Department (HOSPITAL_COMMUNITY)
Admission: EM | Admit: 2019-02-14 | Discharge: 2019-02-14 | Disposition: A | Discharge status: Home / Self Care | Payer: Medicare HMO | Attending: Emergency Medicine | Admitting: Emergency Medicine

## 2019-02-14 ENCOUNTER — Other Ambulatory Visit: Payer: Self-pay

## 2019-02-14 ENCOUNTER — Encounter (HOSPITAL_COMMUNITY): Payer: Self-pay | Admitting: Emergency Medicine

## 2019-02-14 DIAGNOSIS — F1210 Cannabis abuse, uncomplicated: Secondary | ICD-10-CM | POA: Diagnosis not present

## 2019-02-14 DIAGNOSIS — Z96641 Presence of right artificial hip joint: Secondary | ICD-10-CM | POA: Insufficient documentation

## 2019-02-14 DIAGNOSIS — I110 Hypertensive heart disease with heart failure: Secondary | ICD-10-CM | POA: Diagnosis not present

## 2019-02-14 DIAGNOSIS — R0789 Other chest pain: Secondary | ICD-10-CM | POA: Diagnosis not present

## 2019-02-14 DIAGNOSIS — R079 Chest pain, unspecified: Secondary | ICD-10-CM | POA: Diagnosis not present

## 2019-02-14 DIAGNOSIS — M549 Dorsalgia, unspecified: Secondary | ICD-10-CM | POA: Insufficient documentation

## 2019-02-14 DIAGNOSIS — I2510 Atherosclerotic heart disease of native coronary artery without angina pectoris: Secondary | ICD-10-CM | POA: Diagnosis not present

## 2019-02-14 DIAGNOSIS — Z951 Presence of aortocoronary bypass graft: Secondary | ICD-10-CM | POA: Diagnosis not present

## 2019-02-14 DIAGNOSIS — E1140 Type 2 diabetes mellitus with diabetic neuropathy, unspecified: Secondary | ICD-10-CM | POA: Insufficient documentation

## 2019-02-14 DIAGNOSIS — J449 Chronic obstructive pulmonary disease, unspecified: Secondary | ICD-10-CM | POA: Insufficient documentation

## 2019-02-14 DIAGNOSIS — I5022 Chronic systolic (congestive) heart failure: Secondary | ICD-10-CM | POA: Diagnosis not present

## 2019-02-14 DIAGNOSIS — F17210 Nicotine dependence, cigarettes, uncomplicated: Secondary | ICD-10-CM | POA: Diagnosis not present

## 2019-02-14 DIAGNOSIS — Z79899 Other long term (current) drug therapy: Secondary | ICD-10-CM | POA: Insufficient documentation

## 2019-02-14 DIAGNOSIS — Z7982 Long term (current) use of aspirin: Secondary | ICD-10-CM | POA: Insufficient documentation

## 2019-02-14 LAB — CBC WITH DIFFERENTIAL/PLATELET
Abs Immature Granulocytes: 0.04 10*3/uL (ref 0.00–0.07)
Basophils Absolute: 0 10*3/uL (ref 0.0–0.1)
Basophils Relative: 0 %
Eosinophils Absolute: 0.2 10*3/uL (ref 0.0–0.5)
Eosinophils Relative: 2 %
HCT: 37.1 % — ABNORMAL LOW (ref 39.0–52.0)
Hemoglobin: 11.3 g/dL — ABNORMAL LOW (ref 13.0–17.0)
Immature Granulocytes: 0 %
Lymphocytes Relative: 20 %
Lymphs Abs: 2 10*3/uL (ref 0.7–4.0)
MCH: 28.4 pg (ref 26.0–34.0)
MCHC: 30.5 g/dL (ref 30.0–36.0)
MCV: 93.2 fL (ref 80.0–100.0)
Monocytes Absolute: 0.8 10*3/uL (ref 0.1–1.0)
Monocytes Relative: 9 %
Neutro Abs: 6.6 10*3/uL (ref 1.7–7.7)
Neutrophils Relative %: 69 %
Platelets: 209 10*3/uL (ref 150–400)
RBC: 3.98 MIL/uL — ABNORMAL LOW (ref 4.22–5.81)
RDW: 14.7 % (ref 11.5–15.5)
WBC: 9.7 10*3/uL (ref 4.0–10.5)
nRBC: 0 % (ref 0.0–0.2)

## 2019-02-14 LAB — BASIC METABOLIC PANEL
Anion gap: 8 (ref 5–15)
BUN: 26 mg/dL — ABNORMAL HIGH (ref 8–23)
CO2: 21 mmol/L — ABNORMAL LOW (ref 22–32)
Calcium: 9.4 mg/dL (ref 8.9–10.3)
Chloride: 111 mmol/L (ref 98–111)
Creatinine, Ser: 1.65 mg/dL — ABNORMAL HIGH (ref 0.61–1.24)
GFR calc Af Amer: 49 mL/min — ABNORMAL LOW (ref >60)
GFR calc non Af Amer: 42 mL/min — ABNORMAL LOW (ref >60)
Glucose, Bld: 90 mg/dL (ref 70–99)
Potassium: 3.6 mmol/L (ref 3.5–5.1)
Sodium: 140 mmol/L (ref 135–145)

## 2019-02-14 LAB — URINALYSIS, ROUTINE W REFLEX MICROSCOPIC
Bilirubin Urine: NEGATIVE
Glucose, UA: NEGATIVE mg/dL
Hgb urine dipstick: NEGATIVE
Ketones, ur: NEGATIVE mg/dL
Leukocytes,Ua: NEGATIVE
Nitrite: NEGATIVE
Protein, ur: NEGATIVE mg/dL
Specific Gravity, Urine: 1.01 (ref 1.005–1.030)
pH: 5 (ref 5.0–8.0)

## 2019-02-14 LAB — TROPONIN I (HIGH SENSITIVITY)
Troponin I (High Sensitivity): 9 ng/L (ref <18)
Troponin I (High Sensitivity): 8 ng/L (ref <18)

## 2019-02-14 NOTE — ED Provider Notes (Signed)
[facility] EMERGENCY DEPARTMENT Provider Note   CSN: [id number] Arrival date & time: 02/14/19  1052     History   Chief Complaint Chief Complaint  Patient presents with   Chest Pain    HPI [name] [name] is a 68 y.o. male with past medical history significant for CABG x5 in 2014, [employer] single chamber ICD since 2016 for chronic systolic CHF, emphysema, diabetes, ischemic cardiomyopathy, PVD presents emergency department today with chief complaint of chest pain x3 days.  Patient states the pain started while he was sitting in his chair.  The pain is located on the left side of his chest.  He describes the pain as tightness.  He states that radiates down his left arm. He states his pain has been intermittent since onset.   When he changes position he feels like the pain radiates to his back.  Denies pain is worse with exertion. He did take a nitroglycerin yesterday which improved the pain.  He rates his pain 2 out of 10 in severity. He denies fever, chills, abdominal pain, nausea, lower extremity edema, syncope.  Patient takes aspirin daily.  Patient's cardiologist is Dr. Aundra Dubin. Pt continues to smoke half ppd.  Past Medical History:  Diagnosis Date   AICD (automatic cardioverter/defibrillator) present    reports no shocks    CAD (coronary artery disease)    a. 04/2013 Cath: LM 10, LAD 40p, 105m, D1 50p, LCX 60p, 87m/100m, OM1 50, OM2 100 L-L collats, RCA 100p;  b. CABG x 5: LIMA->LAD, VG->D2, VG->RI->OM2, VG->PDA.   Chronic systolic CHF (congestive heart failure) ([place])    a. 04/2013 Echo: EF 15-20%, diff HK, sev HK of inf and ant myocardium, dilated LA,  b. EF 20-25% with restrictive filling pattern, mod RV dysfx, mild MR (10/10/2013);  c.  Echo 12/15: EF 25-30%   COPD (chronic obstructive pulmonary disease) ([place])    Diabetes mellitus without complication ([place])    unsure what type but was dx as an adult, takes no meds, check his cbg at home 2 times a week     Emphysema    Gout    Hyperlipidemia    Hypertension    Ischemic cardiomyopathy    Peripheral neuropathy    reprots bilateral lower extemity paresthesia    PVD (peripheral vascular disease) ([place])    a. (10/2013) ABIs: RIGHT 0.63, Waveforms: monophasic;  LEFT 0.78, Waveforms: monophasic   Tobacco abuse    30+ pack-year history   Transaminitis    a. 04/2013 Abd U/S and CT unremarkable, hepatitis panel neg -->felt to be 2/2 acute R heart failure.    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[place]) 11/30/2018   Avascular necrosis of hip, right ([place]) 11/30/2018   Chest pain 04/08/2015   Abnormal nuclear stress test 04/08/2015   Ischemic cardiomyopathy 07/06/2014   Lumbago 03/13/2014   Special screening for malignant neoplasms, colon 02/09/2014   Normocytic anemia 02/09/2014   Pleural effusion on left 11/28/2013   Atherosclerosis of native arteries of extremity with intermittent claudication ([place]) 11/07/2013   Weakness of both legs 11/07/2013   Smoker 09/19/2013   Numbness in right leg/ Foot 06/14/2013   Pain in limb-Right Leg/foot 06/14/2013   Atherosclerosis of native arteries of the extremities with ulceration(440.23) 68/34/1962   Chronic systolic heart failure ([place]) 05/29/2013   Atrial fibrillation ([place]) 05/29/2013   Chest pain, mid sternal 05/19/2013   S/P CABG x 5  05/08/2013   Right foot ulcer ([place]) 05/04/2013   Coronary atherosclerosis of native coronary artery 98/33/8250   Acute systolic heart failure ([place]) 04/28/2013   Abnormal blood chemistry 12/30/2012   Abnormal LFTs 12/30/2012   Aspiration pneumonia ([place]) 12/30/2012   Chronic congestive heart failure ([place]) 12/27/2012   Leg weakness 12/26/2012   Pneumonia 05/18/2011   Gout 05/17/2011   Respiratory distress, acute 05/17/2011   Hypertension 05/17/2011   COPD (chronic obstructive pulmonary disease) ([place]) 05/17/2011   CHF, acute  ([place]) 05/17/2011    Past Surgical History:  Procedure Laterality Date   APPENDECTOMY  as a child   CARDIAC CATHETERIZATION  04/08/2015   Procedure: Left Heart Cath and Cors/Grafts Angiography;  Surgeon: Peter M Martinique, MD;  Location: [facility];  Service: Cardiovascular;;   CORONARY ARTERY BYPASS GRAFT N/A 05/05/2013   Procedure: CORONARY ARTERY BYPASS GRAFTING (CABG) TIMES FIVE USING LEFT INTERNAL MAMMARY ARTERY AND RIGHT AND LEFT SAPHENOUS LEG VEIN HARVESTED ENDOSCOPICALLY;  Surgeon: Melrose Nakayama, MD;  Location: [facility];  Service: Open Heart Surgery;  Laterality: N/A;   IMPLANTABLE CARDIOVERTER DEFIBRILLATOR IMPLANT N/A 07/06/2014   Procedure: IMPLANTABLE CARDIOVERTER DEFIBRILLATOR IMPLANT;  Surgeon: Deboraha Sprang, MD;  Location[facility];  Service: Cardiovascular;  Laterality: N/A;   LEFT AND RIGHT HEART CATHETERIZATION WITH CORONARY ANGIOGRAM N/A 04/28/2013   Procedure: LEFT AND RIGHT HEART CATHETERIZATION WITH CORONARY ANGIOGRAM;  Surgeon: Burnell Blanks, MD;  Location: [facility] CATH LAB;  Service: Cardiovascular;  Laterality: N/A;   MULTIPLE EXTRACTIONS WITH ALVEOLOPLASTY N/A 05/03/2013   Procedure: Extraction of tooth #s 3,4,5,6,8,9,27 with alveoloplast and maxillary left osseous tuberosity reduction;  Surgeon: Lenn Cal, DDS;  Location: [facility];  Service: Oral Surgery;  Laterality: N/A;   TOTAL HIP ARTHROPLASTY Right 11/30/2018   Procedure: TOTAL HIP ARTHROPLASTY ANTERIOR APPROACH;  Surgeon: Rod Can, MD;  Location: [facility];  Service: Orthopedics;  Laterality: Right;        Home Medications    Prior to Admission medications   Medication Sig Start Date End Date Taking? Authorizing Provider  albuterol (PROVENTIL HFA;VENTOLIN HFA) 108 (90 BASE) MCG/ACT inhaler Inhale 1 puff into the lungs every 6 (six) hours as needed for wheezing or shortness of breath.   Yes [provider]  allopurinol (ZYLOPRIM) 300 MG tablet Take 300 mg by mouth  daily.   Yes [provider]  aspirin 81 MG chewable tablet Chew 1 tablet (81 mg total) by mouth 2 (two) times daily. 12/01/18  Yes Swinteck, Aaron Edelman, MD  atorvastatin (LIPITOR) 20 MG tablet Take 20 mg by mouth daily.   Yes [provider]  azelastine (OPTIVAR) 0.05 % ophthalmic solution Place 1 drop into the left eye 2 (two) times daily.  04/21/18  Yes [provider]  b complex vitamins tablet Take 1 tablet by mouth daily.   Yes [provider]  budesonide-formoterol (SYMBICORT) 80-4.5 MCG/ACT inhaler Inhale 2 puffs into the lungs 2 (two) times a day.    Yes [provider]  carvedilol (COREG) 6.25 MG tablet Take 6.25-12.5 mg by mouth 2 (two) times daily with a meal. 2 tablets in the morning. 1 tablet in the afternoon 11/26/18  Yes [provider]  colchicine (COLCRYS) 0.6 MG tablet Take 0.6 mg by mouth daily as needed (gout).   Yes [provider]  docusate sodium (COLACE) 100 MG capsule Take 1 capsule (100 mg total) by mouth 2 (two) times daily. 12/01/18  Yes Swinteck, Aaron Edelman, MD  furosemide (LASIX)  20 MG tablet Take 40 mg by mouth daily.   Yes [provider]  hydrALAZINE (APRESOLINE) 25 MG tablet Take 0.5 tablets (12.5 mg total) by mouth 3 (three) times daily. 02/02/19 05/03/19 Yes Clegg, Amy D, NP  HYDROcodone-acetaminophen (NORCO/VICODIN) 5-325 MG tablet Take 1-2 tablets by mouth every 4 (four) hours as needed for moderate pain (pain score 4-6). 12/01/18  Yes Swinteck, Aaron Edelman, MD  ibuprofen (ADVIL) 800 MG tablet  12/01/18  Yes [provider]  isosorbide mononitrate (IMDUR) 30 MG 24 hr tablet Take 1 tablet (30 mg total) by mouth daily. 02/02/19 05/03/19 Yes Clegg, Amy D, NP  omeprazole (PRILOSEC) 40 MG capsule Take 40 mg by mouth daily.   Yes [provider]  ondansetron (ZOFRAN) 4 MG tablet Take 1 tablet (4 mg total) by mouth every 6 (six) hours as needed for nausea. 12/01/18  Yes Swinteck, Aaron Edelman, MD    sacubitril-valsartan (ENTRESTO) 24-26 MG Take 1 tablet by mouth 2 (two) times daily.   Yes [provider]  senna (SENOKOT) 8.6 MG TABS tablet Take 1 tablet (8.6 mg total) by mouth 2 (two) times daily. 12/01/18  Yes Swinteck, Aaron Edelman, MD  spironolactone (ALDACTONE) 25 MG tablet TAKE 1 TABLET BY MOUTH EVERY DAY 10/18/18  Yes Bensimhon, Shaune Pascal, MD  tiotropium (SPIRIVA) 18 MCG inhalation capsule Place 18 mcg into inhaler and inhale daily.   Yes [provider]  furosemide (LASIX) 40 MG tablet Take 40 mg by mouth daily.    [provider]    Family History Family History  Problem Relation Age of Onset   Diabetes Mother    Hypertension Other    Heart attack Other        Brothers   Stroke Neg Hx     Social History Social History   Tobacco Use   Smoking status: Current Every Day Smoker    Packs/day: 1.00    Years: 30.00    Pack years: 30.00    Types: Cigarettes   Smokeless tobacco: Never Used   Tobacco comment: 1 pk per day ;  11-23-2018 reports he has been trying to quit and has only smoke 1 cigarette in the last couple days   Substance Use Topics   Alcohol use: No   Drug use: Yes    Types: Marijuana    Comment: last night 11-22-2018     Allergies   Penicillins   Review of Systems Review of Systems  Constitutional: Negative for chills and fever.  HENT: Negative for congestion, rhinorrhea, sinus pressure and sore throat.   Eyes: Negative for pain and redness.  Respiratory: Negative for cough, shortness of breath and wheezing.   Cardiovascular: Positive for chest pain. Negative for palpitations.  Gastrointestinal: Negative for abdominal pain, constipation, diarrhea, nausea and vomiting.  Genitourinary: Negative for dysuria.  Musculoskeletal: Positive for back pain. Negative for arthralgias, myalgias and neck pain.  Skin: Negative for rash and wound.  Neurological: Negative for dizziness, syncope, weakness, numbness and headaches.   Psychiatric/Behavioral: Negative for confusion.     Physical Exam Updated Vital Signs BP 111/63 (BP Location: Right Arm)    Pulse 87    Temp 98.4 F (36.9 C) (Oral)    Resp 15    SpO2 100%   Physical Exam Vitals signs and nursing note reviewed.  Constitutional:      General: He is not in acute distress.    Appearance: He is not ill-appearing.  HENT:     Head: Normocephalic and atraumatic.  Right Ear: Tympanic membrane and external ear normal.     Left Ear: Tympanic membrane and external ear normal.     Nose: Nose normal.     Mouth/Throat:     Mouth: Mucous membranes are moist.     Pharynx: Oropharynx is clear.  Eyes:     General: No scleral icterus.       Right eye: No discharge.        Left eye: No discharge.     Extraocular Movements: Extraocular movements intact.     Conjunctiva/sclera: Conjunctivae normal.     Pupils: Pupils are equal, round, and reactive to light.  Neck:     Musculoskeletal: Normal range of motion.     Vascular: No JVD.  Cardiovascular:     Rate and Rhythm: Normal rate and regular rhythm.     Pulses: Normal pulses.          Radial pulses are 2+ on the right side and 2+ on the left side.     Heart sounds: Normal heart sounds.  Pulmonary:     Comments: Lungs clear to auscultation in all fields. Symmetric chest rise. No wheezing, rales, or rhonchi. Chest:     Chest wall: No tenderness.  Abdominal:     Tenderness: There is no right CVA tenderness or left CVA tenderness.     Comments: Abdomen is soft, non-distended, and non-tender in all quadrants. No rigidity, no guarding. No peritoneal signs.  Musculoskeletal: Normal range of motion.     Right lower leg: No edema.     Left lower leg: No edema.  Skin:    General: Skin is warm and dry.     Capillary Refill: Capillary refill takes less than 2 seconds.  Neurological:     Mental Status: He is oriented to person, place, and time.     GCS: GCS eye subscore is 4. GCS verbal subscore is 5. GCS motor  subscore is 6.     Comments: Fluent speech, no facial droop.  Psychiatric:        Behavior: Behavior normal.      ED Treatments / Results  Labs (all labs ordered are listed, but only abnormal results are displayed) Labs Reviewed  BASIC METABOLIC PANEL - Abnormal; Notable for the following components:      Result Value   CO2 21 (*)    BUN 26 (*)    Creatinine, Ser 1.65 (*)    GFR calc non Af Amer 42 (*)    GFR calc Af Amer 49 (*)    All other components within normal limits  CBC WITH DIFFERENTIAL/PLATELET - Abnormal; Notable for the following components:   RBC 3.98 (*)    Hemoglobin 11.3 (*)    HCT 37.1 (*)    All other components within normal limits  URINALYSIS, ROUTINE W REFLEX MICROSCOPIC  TROPONIN I (HIGH SENSITIVITY)  TROPONIN I (HIGH SENSITIVITY)    EKG EKG Interpretation  Date/Time:  Tuesday February 14 2019 10:57:47 EDT Ventricular Rate:  84 PR Interval:    QRS Duration: 144 QT Interval:  411 QTC Calculation: 486 R Axis:   -60 Text Interpretation:  Sinus rhythm Right bundle branch block Inferior infarct, old Confirmed by Virgel Manifold [phone]) on 02/14/2019 12:39:09 PM   Radiology Dg Chest 2 View  Result Date: 02/14/2019 CLINICAL DATA:  Left chest pain yesterday and again this morning. EXAM: CHEST - 2 VIEW COMPARISON:  PA and lateral chest 03/20/2015. FINDINGS: The patient is status post CABG with an  AICD in place. Heart size is normal. Aortic atherosclerosis is noted. Lungs are clear. No pneumothorax or pleural effusion. No acute or focal bony abnormality. IMPRESSION: No acute disease. Electronically Signed   By: Inge Rise M.D.   On: 02/14/2019 12:25    Procedures Procedures (including critical care time)  Medications Ordered in ED Medications - No data to display   Initial Impression / Assessment and Plan / ED Course  I have reviewed the triage vital signs and the nursing notes.  Pertinent labs & imaging results that were available during my  care of the patient were reviewed by me and considered in my medical decision making (see chart for details).  Patient presents to the emergency department with chest pain. Patient nontoxic appearing, in no apparent distress, vitals without significant abnormality. Fairly benign physical exam.   Patient on cardiac monitor. Given duration of pain, description of pain at rest, no pain with exertion this is would be an atypical presentation of ACS. Pt has significant cardiac history so evaluation initiated with labs, EKG, and CXR. He declines any pain medication. With pain radiation to back dissection is also in the ddx but unlikely as pt is resting comfortably on stretcher, with normotensive pressure.  Work-up in the ER unremarkable. Labs reviewed, no leukocytosis, hemoglobin appears at baseline, or significant electrolyte abnormality. Slight elevation in creatinine. CXR without infiltrate, effusion, pneumothorax, or fracture/dislocation.  Pt has symmetric pulses, no widening of mediastinum on CXR, doubt dissection. EKG without obvious ischemia, delta troponin negative. Patient has appeared hemodynamically stable throughout ER visit and appears safe for discharge with close PCP/cardiology follow up. I discussed results, treatment plan, need for PCP follow-up, and return precautions with the patient. Provided opportunity for questions, patient confirmed understanding and is in agreement with plan. Findings and plan of care discussed with supervising physician Dr. Wilson Singer.  This note was prepared using Dragon voice recognition software and may include unintentional dictation errors due to the inherent limitations of voice recognition software.    Final Clinical Impressions(s) / ED Diagnoses   Final diagnoses:  Atypical chest pain    ED Discharge Orders    None       Flint Melter 02/14/19 [id number]    Virgel Manifold, MD 02/17/19 1145

## 2019-02-14 NOTE — Telephone Encounter (Signed)
Pt called triage line and reported having chest pain. Called pt back. Pt c/o chest pain/tightness radiating down his left arm. In certain positions he can feel it radiating to his back. Advised that pt report to the ED. Pt said he does not have a ride. Suggested pt arrive via EMS. Pt reluctant but agreeable.

## 2019-02-14 NOTE — Discharge Instructions (Signed)
Read instructions below for reasons to return to the Emergency Department. It is recommended that your follow up with your Primary Care Doctor in regards to today's visit.   Tests performed today include: An EKG of your heart A chest x-ray Cardiac enzymes - a blood test for heart muscle damage Blood counts and electrolytes  Chest Pain (Nonspecific)  HOME CARE INSTRUCTIONS  -For the next few days, avoid physical activities that bring on chest pain. Continue physical activities as directed.  -Do not smoke cigarettes or drink alcohol until your symptoms are gone. If you do smoke, it is time to quit. You may receive instructions and counseling on how to stop smoking. Only take over-the-counter or prescription medicine for pain, discomfort, or fever as directed by your caregiver.  -Follow your caregiver's suggestions for further testing if your chest pain does not go away.  -Keep any follow-up appointments you made. If you do not go to an appointment, you could develop lasting (chronic) problems with pain. If there is any problem keeping an appointment, you must call to reschedule.   SEEK MEDICAL CARE IF:  You think you are having problems from the medicine you are taking. Read your medicine instructions carefully.  Your chest pain does not go away, even after treatment.  You develop a rash with blisters on your chest.   SEEK IMMEDIATE MEDICAL CARE IF:  You have increased chest pain or pain that spreads to your arm, neck, jaw, back, or belly (abdomen).  You develop shortness of breath, an increasing cough, or you are coughing up blood.  You have severe back or abdominal pain, feel sick to your stomach (nauseous) or throw up (vomit).  You develop severe weakness, fainting, or chills.  You have an oral temperature above 102 F (38.9 C), not controlled by medicine.   THIS IS AN EMERGENCY. Do not wait to see if the pain will go away. Get medical help at once. Call 911. Do not drive yourself to the  hospital.

## 2019-02-14 NOTE — ED Triage Notes (Signed)
Pt arrives to ED from home with complaints of left sided chest pain starting this morning. Patient in NSR on arrival with CP at 3/10. Patient states he had the chest pain yesterday that was relieved with x1 nitro. Patient received 324 Asprin with EMS.

## 2019-02-23 DIAGNOSIS — N183 Chronic kidney disease, stage 3 (moderate): Secondary | ICD-10-CM | POA: Diagnosis not present

## 2019-02-23 DIAGNOSIS — E1140 Type 2 diabetes mellitus with diabetic neuropathy, unspecified: Secondary | ICD-10-CM | POA: Diagnosis not present

## 2019-02-23 DIAGNOSIS — E7800 Pure hypercholesterolemia, unspecified: Secondary | ICD-10-CM | POA: Diagnosis not present

## 2019-02-23 DIAGNOSIS — J449 Chronic obstructive pulmonary disease, unspecified: Secondary | ICD-10-CM | POA: Diagnosis not present

## 2019-02-23 DIAGNOSIS — E1151 Type 2 diabetes mellitus with diabetic peripheral angiopathy without gangrene: Secondary | ICD-10-CM | POA: Diagnosis not present

## 2019-02-23 DIAGNOSIS — I509 Heart failure, unspecified: Secondary | ICD-10-CM | POA: Diagnosis not present

## 2019-02-23 DIAGNOSIS — I130 Hypertensive heart and chronic kidney disease with heart failure and stage 1 through stage 4 chronic kidney disease, or unspecified chronic kidney disease: Secondary | ICD-10-CM | POA: Diagnosis not present

## 2019-02-23 DIAGNOSIS — M109 Gout, unspecified: Secondary | ICD-10-CM | POA: Diagnosis not present

## 2019-02-23 DIAGNOSIS — I2510 Atherosclerotic heart disease of native coronary artery without angina pectoris: Secondary | ICD-10-CM | POA: Diagnosis not present

## 2019-02-26 ENCOUNTER — Other Ambulatory Visit (HOSPITAL_COMMUNITY): Payer: Self-pay | Admitting: Internal Medicine

## 2019-03-01 ENCOUNTER — Other Ambulatory Visit: Payer: Self-pay

## 2019-03-02 ENCOUNTER — Other Ambulatory Visit: Payer: Self-pay

## 2019-03-02 ENCOUNTER — Encounter: Payer: Self-pay | Admitting: Sports Medicine

## 2019-03-02 ENCOUNTER — Ambulatory Visit (INDEPENDENT_AMBULATORY_CARE_PROVIDER_SITE_OTHER): Payer: Medicare HMO | Admitting: Sports Medicine

## 2019-03-02 DIAGNOSIS — B351 Tinea unguium: Secondary | ICD-10-CM | POA: Diagnosis not present

## 2019-03-02 DIAGNOSIS — M79676 Pain in unspecified toe(s): Secondary | ICD-10-CM | POA: Diagnosis not present

## 2019-03-02 DIAGNOSIS — E1140 Type 2 diabetes mellitus with diabetic neuropathy, unspecified: Secondary | ICD-10-CM

## 2019-03-02 DIAGNOSIS — R32 Unspecified urinary incontinence: Secondary | ICD-10-CM | POA: Diagnosis not present

## 2019-03-02 NOTE — Progress Notes (Signed)
Patient ID: [name] [name], male   DOB: 18-Feb-1951, 68 y.o.   MRN: [id number]  Subjective: [name] [name] is a 68 y.o. male patient with history of type 2 diabetes who returns to office today complaining of long, painful nails  while ambulating in shoes, unable to trim. Patient states that the glucose reading was recorded this morning but cannot recall the number reports that he is struggling with his memory at this time. Patient denies any new changes in medication or new problems.  Patient reports that he has recovered pretty well from his hip surgery.  No other problems.   Patient Active Problem List   Diagnosis Date Noted  . Avascular necrosis of bone of right hip ([place]) 11/30/2018  . Avascular necrosis of hip, right ([place]) 11/30/2018  . Chest pain 04/08/2015  . Abnormal nuclear stress test 04/08/2015  . Ischemic cardiomyopathy 07/06/2014  . Lumbago 03/13/2014  . Special screening for malignant neoplasms, colon 02/09/2014  . Normocytic anemia 02/09/2014  . Pleural effusion on left 11/28/2013  . Atherosclerosis of native arteries of extremity with intermittent claudication ([place]) 11/07/2013  . Weakness of both legs 11/07/2013  . Smoker 09/19/2013  . Numbness in right leg/ Foot 06/14/2013  . Pain in limb-Right Leg/foot 06/14/2013  . Atherosclerosis of native arteries of the extremities with ulceration(440.23) 06/14/2013  . Chronic systolic heart failure ([place]) 05/29/2013  . Atrial fibrillation ([place]) 05/29/2013  . Chest pain, mid sternal 05/19/2013  . S/P CABG x 5 05/08/2013  . Right foot ulcer ([place]) 05/04/2013  . Coronary atherosclerosis of native coronary artery 04/28/2013  . Acute systolic heart failure ([place]) 04/28/2013  . Abnormal blood chemistry 12/30/2012  . Abnormal LFTs 12/30/2012  . Aspiration pneumonia ([place]) 12/30/2012  . Chronic congestive heart failure ([place]) 12/27/2012  . Leg weakness 12/26/2012  . Pneumonia 05/18/2011  . Gout 05/17/2011  . Respiratory distress, acute 05/17/2011  .  Hypertension 05/17/2011  . COPD (chronic obstructive pulmonary disease) ([place]) 05/17/2011  . CHF, acute ([place]) 05/17/2011   Current Outpatient Medications on File Prior to Visit  Medication Sig Dispense Refill  . albuterol (PROVENTIL HFA;VENTOLIN HFA) 108 (90 BASE) MCG/ACT inhaler Inhale 1 puff into the lungs every 6 (six) hours as needed for wheezing or shortness of breath.    . allopurinol (ZYLOPRIM) 300 MG tablet Take 300 mg by mouth daily.    Marland Kitchen aspirin 81 MG chewable tablet Chew 1 tablet (81 mg total) by mouth 2 (two) times daily. 60 tablet 1  . atorvastatin (LIPITOR) 20 MG tablet Take 20 mg by mouth daily.    Marland Kitchen azelastine (OPTIVAR) 0.05 % ophthalmic solution Place 1 drop into the left eye 2 (two) times daily.   0  . b complex vitamins tablet Take 1 tablet by mouth daily.    . budesonide-formoterol (SYMBICORT) 80-4.5 MCG/ACT inhaler Inhale 2 puffs into the lungs 2 (two) times a day.     . carvedilol (COREG) 6.25 MG tablet Take 6.25-12.5 mg by mouth 2 (two) times daily with a meal. 2 tablets in the morning. 1 tablet in the afternoon    . colchicine (COLCRYS) 0.6 MG tablet Take 0.6 mg by mouth daily as needed (gout).    Marland Kitchen docusate sodium (COLACE) 100 MG capsule Take 1 capsule (100 mg total) by mouth 2 (two) times daily. 60 capsule 1  . furosemide (LASIX) 20 MG tablet TAKE 3 TABLETS BY MOUTH EVERY DAY 270 tablet 0  . furosemide (LASIX) 40 MG tablet Take 40 mg by mouth daily.    Marland Kitchen  hydrALAZINE (APRESOLINE) 25 MG tablet Take 0.5 tablets (12.5 mg total) by mouth 3 (three) times daily. 45 tablet 3  . HYDROcodone-acetaminophen (NORCO/VICODIN) 5-325 MG tablet Take 1-2 tablets by mouth every 4 (four) hours as needed for moderate pain (pain score 4-6). 30 tablet 0  . ibuprofen (ADVIL) 800 MG tablet     . isosorbide mononitrate (IMDUR) 30 MG 24 hr tablet Take 1 tablet (30 mg total) by mouth daily. 30 tablet 3  . omeprazole (PRILOSEC) 40 MG capsule Take 40 mg by mouth daily.    . ondansetron (ZOFRAN) 4 MG  tablet Take 1 tablet (4 mg total) by mouth every 6 (six) hours as needed for nausea. 20 tablet 0  . Potassium Chloride ER 20 MEQ TBCR Take 1 tablet by mouth daily.    . sacubitril-valsartan (ENTRESTO) 24-26 MG Take 1 tablet by mouth 2 (two) times daily.    Marland Kitchen senna (SENOKOT) 8.6 MG TABS tablet Take 1 tablet (8.6 mg total) by mouth 2 (two) times daily. 120 each 0  . spironolactone (ALDACTONE) 25 MG tablet TAKE 1 TABLET BY MOUTH EVERY DAY 90 tablet 3  . tiotropium (SPIRIVA) 18 MCG inhalation capsule Place 18 mcg into inhaler and inhale daily.    Marland Kitchen tiZANidine (ZANAFLEX) 4 MG tablet TAKE 1 TABLET BY MOUTH 3 TIMES A DAY AS NEEDED FOR BACK/MUSCLE PAIN     No current facility-administered medications on file prior to visit.    Allergies  Allergen Reactions  . Penicillins Nausea And Vomiting, Swelling and Other (See Comments)    Per Dr Audria Nine Via phone 12/27/12 0400 Swelling , long time ago. Doesn't remember   Objective: General: Patient is awake, alert, and oriented x 3 and in no acute distress.  Integument: Skin is warm, dry and supple bilateral. Continued Moderate plantar>dorsal xerosis bilateral, slowly improving. Nails are tender, long, thickened and dystrophic with subungual debris, consistent with onychomycosis, 1-5 bilateral. No signs of infection.  Minimal callus to 1st toes.  Remaining integument unremarkable.  Vasculature:  Dorsalis Pedis pulse 1/4 bilateral. Posterior Tibial pulse  0/4 bilateral.  Capillary fill time <3 sec 1-5 bilateral. Scant hair growth to the level of the digits. Temperature gradient within normal limits. No varicosities present bilateral. Mild trace edema present bilateral ankles.   Neurology: The patient has diminished sensation measured with a 5.07/10g Semmes Weinstein Monofilament at all pedal sites bilateral . Vibratory sensation diminished bilateral with tuning fork. No Babinski sign present bilateral.   Musculoskeletal: Asymptomatioc pes planus and  bunion pedal deformities noted bilateral. Muscular strength 5/5 in all lower extremity muscular groups bilateral without pain or limitation on range of motion. No tenderness with calf compression bilateral.  Assessment and Plan: Problem List Items Addressed This Visit      Other   Pain in limb-Right Leg/foot    Other Visit Diagnoses    Dermatophytosis of nail    -  Primary   Type 2 diabetes, controlled, with neuropathy ([place])         -Examined patient. -Re-Discussed and educated patient on diabetic foot care, especially with regards to the vascular, neurological and musculoskeletal systems.  -Mechanically debrided all nails 1-5 bilateral using sterile nail nipper and filed with dremel without incident  -Advised patient to continue with daily skin emollients for dry skin like recommended before -Continue with good supportive shoes daily for foot type -Patient to return in 3 months for at risk foot care  -Patient advised to call the office if any problems or  questions arise in the meantime.  Landis Martins, DPM

## 2019-03-07 DIAGNOSIS — J948 Other specified pleural conditions: Secondary | ICD-10-CM | POA: Diagnosis not present

## 2019-03-07 DIAGNOSIS — E1122 Type 2 diabetes mellitus with diabetic chronic kidney disease: Secondary | ICD-10-CM | POA: Diagnosis not present

## 2019-03-07 DIAGNOSIS — N183 Chronic kidney disease, stage 3 (moderate): Secondary | ICD-10-CM | POA: Diagnosis not present

## 2019-03-07 DIAGNOSIS — R0781 Pleurodynia: Secondary | ICD-10-CM | POA: Diagnosis not present

## 2019-03-07 DIAGNOSIS — R7989 Other specified abnormal findings of blood chemistry: Secondary | ICD-10-CM | POA: Diagnosis not present

## 2019-03-07 DIAGNOSIS — F17210 Nicotine dependence, cigarettes, uncomplicated: Secondary | ICD-10-CM | POA: Diagnosis not present

## 2019-03-07 DIAGNOSIS — I959 Hypotension, unspecified: Secondary | ICD-10-CM | POA: Diagnosis not present

## 2019-03-07 DIAGNOSIS — R079 Chest pain, unspecified: Secondary | ICD-10-CM | POA: Diagnosis not present

## 2019-03-07 DIAGNOSIS — I130 Hypertensive heart and chronic kidney disease with heart failure and stage 1 through stage 4 chronic kidney disease, or unspecified chronic kidney disease: Secondary | ICD-10-CM | POA: Diagnosis not present

## 2019-03-07 DIAGNOSIS — I5022 Chronic systolic (congestive) heart failure: Secondary | ICD-10-CM | POA: Diagnosis not present

## 2019-03-07 DIAGNOSIS — I361 Nonrheumatic tricuspid (valve) insufficiency: Secondary | ICD-10-CM | POA: Diagnosis not present

## 2019-03-07 DIAGNOSIS — I5020 Unspecified systolic (congestive) heart failure: Secondary | ICD-10-CM | POA: Diagnosis not present

## 2019-03-07 DIAGNOSIS — J449 Chronic obstructive pulmonary disease, unspecified: Secondary | ICD-10-CM | POA: Diagnosis not present

## 2019-03-07 DIAGNOSIS — I2510 Atherosclerotic heart disease of native coronary artery without angina pectoris: Secondary | ICD-10-CM | POA: Diagnosis not present

## 2019-03-08 DIAGNOSIS — I2510 Atherosclerotic heart disease of native coronary artery without angina pectoris: Secondary | ICD-10-CM | POA: Diagnosis not present

## 2019-03-08 DIAGNOSIS — Z720 Tobacco use: Secondary | ICD-10-CM | POA: Diagnosis not present

## 2019-03-08 DIAGNOSIS — I959 Hypotension, unspecified: Secondary | ICD-10-CM | POA: Diagnosis not present

## 2019-03-08 DIAGNOSIS — J948 Other specified pleural conditions: Secondary | ICD-10-CM | POA: Diagnosis not present

## 2019-03-08 DIAGNOSIS — N183 Chronic kidney disease, stage 3 (moderate): Secondary | ICD-10-CM | POA: Diagnosis not present

## 2019-03-08 DIAGNOSIS — R0781 Pleurodynia: Secondary | ICD-10-CM | POA: Diagnosis not present

## 2019-03-08 DIAGNOSIS — J449 Chronic obstructive pulmonary disease, unspecified: Secondary | ICD-10-CM | POA: Diagnosis not present

## 2019-03-09 DIAGNOSIS — R0781 Pleurodynia: Secondary | ICD-10-CM | POA: Diagnosis not present

## 2019-03-09 DIAGNOSIS — J449 Chronic obstructive pulmonary disease, unspecified: Secondary | ICD-10-CM | POA: Diagnosis not present

## 2019-03-09 DIAGNOSIS — I2510 Atherosclerotic heart disease of native coronary artery without angina pectoris: Secondary | ICD-10-CM | POA: Diagnosis not present

## 2019-03-09 DIAGNOSIS — N183 Chronic kidney disease, stage 3 (moderate): Secondary | ICD-10-CM | POA: Diagnosis not present

## 2019-03-09 DIAGNOSIS — I959 Hypotension, unspecified: Secondary | ICD-10-CM | POA: Diagnosis not present

## 2019-03-09 DIAGNOSIS — J948 Other specified pleural conditions: Secondary | ICD-10-CM | POA: Diagnosis not present

## 2019-03-09 DIAGNOSIS — Z720 Tobacco use: Secondary | ICD-10-CM | POA: Diagnosis not present

## 2019-03-10 DIAGNOSIS — E7800 Pure hypercholesterolemia, unspecified: Secondary | ICD-10-CM | POA: Diagnosis not present

## 2019-03-10 DIAGNOSIS — N183 Chronic kidney disease, stage 3 (moderate): Secondary | ICD-10-CM | POA: Diagnosis not present

## 2019-03-10 DIAGNOSIS — I130 Hypertensive heart and chronic kidney disease with heart failure and stage 1 through stage 4 chronic kidney disease, or unspecified chronic kidney disease: Secondary | ICD-10-CM | POA: Diagnosis not present

## 2019-03-10 DIAGNOSIS — M109 Gout, unspecified: Secondary | ICD-10-CM | POA: Diagnosis not present

## 2019-03-10 DIAGNOSIS — J449 Chronic obstructive pulmonary disease, unspecified: Secondary | ICD-10-CM | POA: Diagnosis not present

## 2019-03-10 DIAGNOSIS — I2510 Atherosclerotic heart disease of native coronary artery without angina pectoris: Secondary | ICD-10-CM | POA: Diagnosis not present

## 2019-03-10 DIAGNOSIS — I509 Heart failure, unspecified: Secondary | ICD-10-CM | POA: Diagnosis not present

## 2019-03-10 DIAGNOSIS — E1151 Type 2 diabetes mellitus with diabetic peripheral angiopathy without gangrene: Secondary | ICD-10-CM | POA: Diagnosis not present

## 2019-03-10 DIAGNOSIS — E1140 Type 2 diabetes mellitus with diabetic neuropathy, unspecified: Secondary | ICD-10-CM | POA: Diagnosis not present

## 2019-03-13 ENCOUNTER — Other Ambulatory Visit: Payer: Self-pay

## 2019-03-13 ENCOUNTER — Encounter (HOSPITAL_COMMUNITY): Payer: Self-pay | Admitting: Emergency Medicine

## 2019-03-13 ENCOUNTER — Emergency Department (HOSPITAL_COMMUNITY)
Admission: EM | Admit: 2019-03-13 | Discharge: 2019-03-13 | Disposition: A | Discharge status: Home / Self Care | Payer: Medicare HMO | Attending: Emergency Medicine | Admitting: Emergency Medicine

## 2019-03-13 ENCOUNTER — Emergency Department (HOSPITAL_COMMUNITY): Payer: Medicare HMO

## 2019-03-13 DIAGNOSIS — I5022 Chronic systolic (congestive) heart failure: Secondary | ICD-10-CM | POA: Insufficient documentation

## 2019-03-13 DIAGNOSIS — J449 Chronic obstructive pulmonary disease, unspecified: Secondary | ICD-10-CM | POA: Diagnosis not present

## 2019-03-13 DIAGNOSIS — Z951 Presence of aortocoronary bypass graft: Secondary | ICD-10-CM | POA: Diagnosis not present

## 2019-03-13 DIAGNOSIS — R0789 Other chest pain: Secondary | ICD-10-CM | POA: Diagnosis not present

## 2019-03-13 DIAGNOSIS — R05 Cough: Secondary | ICD-10-CM | POA: Diagnosis not present

## 2019-03-13 DIAGNOSIS — R079 Chest pain, unspecified: Secondary | ICD-10-CM | POA: Diagnosis not present

## 2019-03-13 DIAGNOSIS — F17210 Nicotine dependence, cigarettes, uncomplicated: Secondary | ICD-10-CM | POA: Insufficient documentation

## 2019-03-13 DIAGNOSIS — I2510 Atherosclerotic heart disease of native coronary artery without angina pectoris: Secondary | ICD-10-CM | POA: Insufficient documentation

## 2019-03-13 DIAGNOSIS — Z79899 Other long term (current) drug therapy: Secondary | ICD-10-CM | POA: Diagnosis not present

## 2019-03-13 DIAGNOSIS — Z95810 Presence of automatic (implantable) cardiac defibrillator: Secondary | ICD-10-CM | POA: Insufficient documentation

## 2019-03-13 DIAGNOSIS — Z7982 Long term (current) use of aspirin: Secondary | ICD-10-CM | POA: Insufficient documentation

## 2019-03-13 DIAGNOSIS — I110 Hypertensive heart disease with heart failure: Secondary | ICD-10-CM | POA: Diagnosis not present

## 2019-03-13 LAB — BASIC METABOLIC PANEL
Anion gap: 9 (ref 5–15)
BUN: 44 mg/dL — ABNORMAL HIGH (ref 8–23)
CO2: 22 mmol/L (ref 22–32)
Calcium: 9.6 mg/dL (ref 8.9–10.3)
Chloride: 107 mmol/L (ref 98–111)
Creatinine, Ser: 2.09 mg/dL — ABNORMAL HIGH (ref 0.61–1.24)
GFR calc Af Amer: 37 mL/min — ABNORMAL LOW (ref >60)
GFR calc non Af Amer: 32 mL/min — ABNORMAL LOW (ref >60)
Glucose, Bld: 117 mg/dL — ABNORMAL HIGH (ref 70–99)
Potassium: 4.2 mmol/L (ref 3.5–5.1)
Sodium: 138 mmol/L (ref 135–145)

## 2019-03-13 LAB — TROPONIN I (HIGH SENSITIVITY)
Troponin I (High Sensitivity): 7 ng/L (ref <18)
Troponin I (High Sensitivity): 7 ng/L (ref <18)

## 2019-03-13 LAB — CBC
HCT: 39.3 % (ref 39.0–52.0)
Hemoglobin: 12.2 g/dL — ABNORMAL LOW (ref 13.0–17.0)
MCH: 28.4 pg (ref 26.0–34.0)
MCHC: 31 g/dL (ref 30.0–36.0)
MCV: 91.4 fL (ref 80.0–100.0)
Platelets: 228 10*3/uL (ref 150–400)
RBC: 4.3 MIL/uL (ref 4.22–5.81)
RDW: 15 % (ref 11.5–15.5)
WBC: 9.2 10*3/uL (ref 4.0–10.5)
nRBC: 0 % (ref 0.0–0.2)

## 2019-03-13 MED ORDER — SODIUM CHLORIDE 0.9 % IV BOLUS
500.0000 mL | Freq: Once | INTRAVENOUS | Status: AC
  Administered 2019-03-13: 14:00:00 500 mL via INTRAVENOUS

## 2019-03-13 MED ORDER — SODIUM CHLORIDE 0.9% FLUSH
3.0000 mL | Freq: Once | INTRAVENOUS | Status: AC
  Administered 2019-03-13: 14:00:00 3 mL via INTRAVENOUS

## 2019-03-13 NOTE — ED Notes (Signed)
ED Provider at bedside. 

## 2019-03-13 NOTE — ED Notes (Addendum)
Pt contact - [name] H. [phone]

## 2019-03-13 NOTE — Discharge Instructions (Addendum)
If you develop recurrent, continued, or worsening chest pain, shortness of breath, fever, vomiting, abdominal or back pain, or any other new/concerning symptoms then return to the ER for evaluation.  

## 2019-03-13 NOTE — ED Triage Notes (Signed)
Pt reports L sided chest pain for the past few weeks, states he was admitted in [place] for 2 days a few weeks ago but is still having pain. Denies sob. resp e/u, nad.

## 2019-03-13 NOTE — ED Provider Notes (Signed)
[facility] EMERGENCY DEPARTMENT Provider Note   CSN: [id number] Arrival date & time: 03/13/19  1004     History   Chief Complaint Chief Complaint  Patient presents with   Chest Pain    HPI [name] [name] is a 68 y.o. male.     HPI  68 year old male presents with chest pain.  Has been ongoing for about a week.  He is very vague on the description of the pain but it seems to be left anterior chest, midaxillary line, and posterior chest.  He was just admitted and discharged from [facility] last week.  He is on antibiotics for what he states was an infection in his chest.  Some cough though is unclear if this is from still smoking or a new cough.  No vomiting.  He has been taking 800 mg ibuprofen which seems to help. No leg swelling.  Past Medical History:  Diagnosis Date   AICD (automatic cardioverter/defibrillator) present    reports no shocks    CAD (coronary artery disease)    a. 04/2013 Cath: LM 10, LAD 40p, 83m, D1 50p, LCX 60p, 17m/100m, OM1 50, OM2 100 L-L collats, RCA 100p;  b. CABG x 5: LIMA->LAD, VG->D2, VG->RI->OM2, VG->PDA.   Chronic systolic CHF (congestive heart failure) ([place])    a. 04/2013 Echo: EF 15-20%, diff HK, sev HK of inf and ant myocardium, dilated LA,  b. EF 20-25% with restrictive filling pattern, mod RV dysfx, mild MR (10/10/2013);  c.  Echo 12/15: EF 25-30%   COPD (chronic obstructive pulmonary disease) ([place])    Diabetes mellitus without complication ([place])    unsure what type but was dx as an adult, takes no meds, check his cbg at home 2 times a week    Emphysema    Gout    Hyperlipidemia    Hypertension    Ischemic cardiomyopathy    Peripheral neuropathy    reprots bilateral lower extemity paresthesia    PVD (peripheral vascular disease) ([place])    a. (10/2013) ABIs: RIGHT 0.63, Waveforms: monophasic;  LEFT 0.78, Waveforms: monophasic   Tobacco abuse    30+ pack-year history   Transaminitis    a. 04/2013 Abd U/S and CT  unremarkable, hepatitis panel neg -->felt to be 2/2 acute R heart failure.    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 ([place]) 11/07/2013   Weakness of both legs 11/07/2013   Smoker 09/19/2013   Numbness in right leg/ Foot 06/14/2013   Pain in limb-Right Leg/foot 06/14/2013   Atherosclerosis of native arteries of the extremities with ulceration(440.23) 92/42/6834   Chronic systolic heart failure ([place]) 05/29/2013   Atrial fibrillation ([place]) 05/29/2013   Chest pain, mid sternal 05/19/2013   S/P CABG x 5 05/08/2013   Right foot ulcer ([place]) 05/04/2013   Coronary atherosclerosis of native coronary artery 19/62/2297   Acute systolic heart failure ([place]) 04/28/2013   Abnormal blood chemistry 12/30/2012   Abnormal LFTs 12/30/2012   Aspiration pneumonia ([place]) 12/30/2012   Chronic congestive heart failure ([place]) 12/27/2012   Leg weakness 12/26/2012  Pneumonia 05/18/2011   Gout 05/17/2011   Respiratory distress, acute 05/17/2011   Hypertension 05/17/2011   COPD (chronic obstructive pulmonary disease) ([place]) 05/17/2011   CHF, acute ([place]) 05/17/2011    Past Surgical History:  Procedure Laterality Date   APPENDECTOMY  as a child   CARDIAC CATHETERIZATION  04/08/2015   Procedure: Left Heart Cath and Cors/Grafts Angiography;  Surgeon: Peter M Martinique, MD;  Location: [facility];  Service: Cardiovascular;;   CORONARY ARTERY BYPASS GRAFT N/A 05/05/2013   Procedure: CORONARY ARTERY BYPASS GRAFTING (CABG)  TIMES FIVE USING LEFT INTERNAL MAMMARY ARTERY AND RIGHT AND LEFT SAPHENOUS LEG VEIN HARVESTED ENDOSCOPICALLY;  Surgeon: Melrose Nakayama, MD;  Location: [facility];  Service: Open Heart Surgery;  Laterality: N/A;   IMPLANTABLE CARDIOVERTER DEFIBRILLATOR IMPLANT N/A 07/06/2014   Procedure: IMPLANTABLE CARDIOVERTER DEFIBRILLATOR IMPLANT;  Surgeon: Deboraha Sprang, MD;  Location: [facility] C[facility] LAB;  Service: Cardiovascular;  Laterality: N/A;   LEFT AND RIGHT HEART CATHETERIZATION WITH CORONARY ANGIOGRAM N/A 04/28/2013   Procedure: LEFT AND RIGHT HEART CATHETERIZATION WITH CORONARY ANGIOGRAM;  Surgeon: Burnell Blanks, MD;  Location: [facility] C[facility] LAB;  Service: Cardiovascular;  Laterality: N/A;   MULTIPLE EXTRACTIONS WITH ALVEOLOPLASTY N/A 05/03/2013   Procedure: Extraction of tooth #s 3,4,5,6,8,9,27 with alveoloplast and maxillary left osseous tuberosity reduction;  Surgeon: Lenn Cal, DDS;  Location: [facility];  Service: Oral Surgery;  Laterality: N/A;   TOTAL HIP ARTHROPLASTY Right 11/30/2018   Procedure: TOTAL HIP ARTHROPLASTY ANTERIOR APPROACH;  Surgeon: Rod Can, MD;  Location: [facility];  Service: Orthopedics;  Laterality: Right;        Home Medications    Prior to Admission medications   Medication Sig Start Date End Date Taking? Authorizing Provider  albuterol (PROVENTIL HFA;VENTOLIN HFA) 108 (90 BASE) MCG/ACT inhaler Inhale 1 puff into the lungs every 6 (six) hours as needed for wheezing or shortness of breath.    [provider]  allopurinol (ZYLOPRIM) 300 MG tablet Take 300 mg by mouth daily.    [provider]  aspirin 81 MG chewable tablet Chew 1 tablet (81 mg total) by mouth 2 (two) times daily. 12/01/18   Swinteck, Aaron Edelman, MD  atorvastatin (LIPITOR) 20 MG tablet Take 20 mg by mouth daily.    [provider]  azelastine (OPTIVAR) 0.05 % ophthalmic solution Place 1 drop into the left eye 2 (two) times daily.  04/21/18   [provider]  b complex  vitamins tablet Take 1 tablet by mouth daily.    [provider]  budesonide-formoterol (SYMBICORT) 80-4.5 MCG/ACT inhaler Inhale 2 puffs into the lungs 2 (two) times a day.     [provider]  carvedilol (COREG) 6.25 MG tablet Take 6.25-12.5 mg by mouth 2 (two) times daily with a meal. 2 tablets in the morning. 1 tablet in the afternoon 11/26/18   [provider]  colchicine (COLCRYS) 0.6 MG tablet Take 0.6 mg by mouth daily as needed (gout).    [provider]  docusate sodium (COLACE) 100 MG capsule Take 1 capsule (100 mg total) by mouth 2 (two) times daily. 12/01/18   Swinteck, Aaron Edelman, MD  furosemide (LASIX) 20 MG tablet TAKE 3 TABLETS BY MOUTH EVERY DAY 02/28/19   Larey Dresser, MD  furosemide (LASIX) 40 MG tablet Take 40 mg by mouth daily.    [provider]  hydrALAZINE (APRESOLINE) 25 MG tablet Take 0.5 tablets (12.5 mg total) by mouth 3 (three) times daily. 02/02/19 05/03/19  Clegg,  Amy D, NP  HYDROcodone-acetaminophen (NORCO/VICODIN) 5-325 MG tablet Take 1-2 tablets by mouth every 4 (four) hours as needed for moderate pain (pain score 4-6). 12/01/18   Swinteck, Aaron Edelman, MD  ibuprofen (ADVIL) 800 MG tablet  12/01/18   [provider]  isosorbide mononitrate (IMDUR) 30 MG 24 hr tablet Take 1 tablet (30 mg total) by mouth daily. 02/02/19 05/03/19  Clegg, Amy D, NP  omeprazole (PRILOSEC) 40 MG capsule Take 40 mg by mouth daily.    [provider]  ondansetron (ZOFRAN) 4 MG tablet Take 1 tablet (4 mg total) by mouth every 6 (six) hours as needed for nausea. 12/01/18   Swinteck, Aaron Edelman, MD  Potassium Chloride ER 20 MEQ TBCR Take 1 tablet by mouth daily. 01/25/19   [provider]  sacubitril-valsartan (ENTRESTO) 24-26 MG Take 1 tablet by mouth 2 (two) times daily.    [provider]  senna (SENOKOT) 8.6 MG TABS tablet Take 1 tablet (8.6 mg total) by mouth 2 (two) times daily. 12/01/18   Swinteck, Aaron Edelman, MD  spironolactone  (ALDACTONE) 25 MG tablet TAKE 1 TABLET BY MOUTH EVERY DAY 10/18/18   Bensimhon, Shaune Pascal, MD  tiotropium (SPIRIVA) 18 MCG inhalation capsule Place 18 mcg into inhaler and inhale daily.    [provider]  tiZANidine (ZANAFLEX) 4 MG tablet TAKE 1 TABLET BY MOUTH 3 TIMES A DAY AS NEEDED FOR BACK/MUSCLE PAIN 01/25/19   [provider]    Family History Family History  Problem Relation Age of Onset   Diabetes Mother    Hypertension Other    Heart attack Other        Brothers   Stroke Neg Hx     Social History Social History   Tobacco Use   Smoking status: Current Every Day Smoker    Packs/day: 1.00    Years: 30.00    Pack years: 30.00    Types: Cigarettes   Smokeless tobacco: Never Used   Tobacco comment: 1 pk per day ;  11-23-2018 reports he has been trying to quit and has only smoke 1 cigarette in the last couple days   Substance Use Topics   Alcohol use: No   Drug use: Yes    Types: Marijuana    Comment: last night 11-22-2018     Allergies   Penicillins   Review of Systems Review of Systems  Constitutional: Negative for fever.  Respiratory: Positive for cough. Negative for shortness of breath.   Cardiovascular: Positive for chest pain.  All other systems reviewed and are negative.    Physical Exam Updated Vital Signs BP 114/77    Pulse 85    Temp 98 F (36.7 C) (Oral)    Resp 14    SpO2 98%   Physical Exam Vitals signs and nursing note reviewed.  Constitutional:      General: He is not in acute distress.    Appearance: He is well-developed. He is not ill-appearing or diaphoretic.  HENT:     Head: Normocephalic and atraumatic.     Right Ear: External ear normal.     Left Ear: External ear normal.     Nose: Nose normal.  Eyes:     General:        Right eye: No discharge.        Left eye: No discharge.  Neck:     Musculoskeletal: Neck supple.  Cardiovascular:     Rate and Rhythm: Normal rate and regular rhythm.  Heart sounds:  Normal heart sounds.  Pulmonary:     Effort: Pulmonary effort is normal.     Breath sounds: Normal breath sounds.  Chest:     Chest wall: Tenderness present.     Comments: Mild tenderness to anterior and mid axillary chest.  No rash Abdominal:     Palpations: Abdomen is soft.     Tenderness: There is no abdominal tenderness.  Musculoskeletal:     Right lower leg: No edema.     Left lower leg: No edema.  Skin:    General: Skin is warm and dry.  Neurological:     Mental Status: He is alert.  Psychiatric:        Mood and Affect: Mood is not anxious.      ED Treatments / Results  Labs (all labs ordered are listed, but only abnormal results are displayed) Labs Reviewed  BASIC METABOLIC PANEL - Abnormal; Notable for the following components:      Result Value   Glucose, Bld 117 (*)    BUN 44 (*)    Creatinine, Ser 2.09 (*)    GFR calc non Af Amer 32 (*)    GFR calc Af Amer 37 (*)    All other components within normal limits  CBC - Abnormal; Notable for the following components:   Hemoglobin 12.2 (*)    All other components within normal limits  TROPONIN I (HIGH SENSITIVITY)  TROPONIN I (HIGH SENSITIVITY)    EKG EKG Interpretation  Date/Time:  Monday March 13 2019 10:12:57 EDT Ventricular Rate:  96 PR Interval:  168 QRS Duration: 146 QT Interval:  384 QTC Calculation: 485 R Axis:   -51 Text Interpretation:  Normal sinus rhythm Left axis deviation Left ventricular hypertrophy with QRS widening Lateral infarct , age undetermined Inferior infarct , age undetermined Abnormal ECG similar to Aug 2020 Confirmed by Sherwood Gambler [phone]) on 03/13/2019 12:56:56 PM   Radiology Dg Chest 2 View  Result Date: 03/13/2019 CLINICAL DATA:  Left-sided chest pain for a few weeks EXAM: CHEST - 2 VIEW COMPARISON:  02/14/2019 FINDINGS: Normal heart size. CABG. ICD lead into the right ventricle in stable position. There is no edema, consolidation, effusion, or pneumothorax.  IMPRESSION: No evidence of active disease. Electronically Signed   By: Monte Fantasia M.D.   On: 03/13/2019 11:58    Procedures Procedures (including critical care time)  Medications Ordered in ED Medications  sodium chloride flush (NS) 0.9 % injection 3 mL (3 mLs Intravenous Given 03/13/19 1352)  sodium chloride 0.9 % bolus 500 mL (0 mLs Intravenous Stopped 03/13/19 1522)     Initial Impression / Assessment and Plan / ED Course  I have reviewed the triage vital signs and the nursing notes.  Pertinent labs & imaging results that were available during my care of the patient were reviewed by me and considered in my medical decision making (see chart for details).        Patient's discharge paperwork indicates that he was put on 2 different antibiotics for a mass that was seen on a chest CT scan.  It was noted he will need a repeat but will be treated as infectious.  Here, he has a mild bump in his creatinine compared to last month that could be from some dehydration with his elevated BUN or could be from using ibuprofen which she is not supposed to be using.  He does not appear overloaded so I will give him a small bolus of fluid.  Second troponin shows no change.  Not consistent with ACS.  Highly doubt PE or dissection.  At this point, unclear what is causing his chest pain but I doubt it is serious.  I do not think repeat CT would be needed for this possible infection as it is too early to see any significant change.  Discharged home to follow-up with cardiology.  I stressed he should stop using ibuprofen and instead use Tylenol.  Final Clinical Impressions(s) / ED Diagnoses   Final diagnoses:  Atypical chest pain    ED Discharge Orders    None       Sherwood Gambler, MD 03/13/19 1530

## 2019-03-13 NOTE — ED Notes (Signed)
Patient verbalizes understanding of discharge instructions. Opportunity for questioning and answers were provided. Armband removed by staff, pt discharged from ED.  

## 2019-03-14 DIAGNOSIS — I959 Hypotension, unspecified: Secondary | ICD-10-CM | POA: Diagnosis not present

## 2019-03-14 DIAGNOSIS — Z23 Encounter for immunization: Secondary | ICD-10-CM | POA: Diagnosis not present

## 2019-03-14 DIAGNOSIS — I509 Heart failure, unspecified: Secondary | ICD-10-CM | POA: Diagnosis not present

## 2019-03-14 DIAGNOSIS — N183 Chronic kidney disease, stage 3 (moderate): Secondary | ICD-10-CM | POA: Diagnosis not present

## 2019-03-14 DIAGNOSIS — R918 Other nonspecific abnormal finding of lung field: Secondary | ICD-10-CM | POA: Diagnosis not present

## 2019-03-14 DIAGNOSIS — R0781 Pleurodynia: Secondary | ICD-10-CM | POA: Diagnosis not present

## 2019-03-14 DIAGNOSIS — Z6834 Body mass index (BMI) 34.0-34.9, adult: Secondary | ICD-10-CM | POA: Diagnosis not present

## 2019-03-21 ENCOUNTER — Ambulatory Visit (HOSPITAL_COMMUNITY)
Admission: RE | Admit: 2019-03-21 | Discharge: 2019-03-21 | Disposition: A | Discharge status: Home / Self Care | Payer: Medicare HMO | Source: Ambulatory Visit | Attending: Cardiology | Admitting: Cardiology

## 2019-03-21 ENCOUNTER — Encounter (HOSPITAL_COMMUNITY): Payer: Self-pay | Admitting: Cardiology

## 2019-03-21 ENCOUNTER — Other Ambulatory Visit: Payer: Self-pay

## 2019-03-21 VITALS — BP 123/58 | HR 97 | Wt 219.8 lb

## 2019-03-21 DIAGNOSIS — E1122 Type 2 diabetes mellitus with diabetic chronic kidney disease: Secondary | ICD-10-CM | POA: Insufficient documentation

## 2019-03-21 DIAGNOSIS — I5022 Chronic systolic (congestive) heart failure: Secondary | ICD-10-CM

## 2019-03-21 DIAGNOSIS — J449 Chronic obstructive pulmonary disease, unspecified: Secondary | ICD-10-CM | POA: Insufficient documentation

## 2019-03-21 DIAGNOSIS — M109 Gout, unspecified: Secondary | ICD-10-CM | POA: Insufficient documentation

## 2019-03-21 DIAGNOSIS — I255 Ischemic cardiomyopathy: Secondary | ICD-10-CM | POA: Diagnosis not present

## 2019-03-21 DIAGNOSIS — Z7982 Long term (current) use of aspirin: Secondary | ICD-10-CM | POA: Insufficient documentation

## 2019-03-21 DIAGNOSIS — Z951 Presence of aortocoronary bypass graft: Secondary | ICD-10-CM | POA: Insufficient documentation

## 2019-03-21 DIAGNOSIS — I739 Peripheral vascular disease, unspecified: Secondary | ICD-10-CM | POA: Diagnosis not present

## 2019-03-21 DIAGNOSIS — Z79899 Other long term (current) drug therapy: Secondary | ICD-10-CM | POA: Insufficient documentation

## 2019-03-21 DIAGNOSIS — M1990 Unspecified osteoarthritis, unspecified site: Secondary | ICD-10-CM | POA: Diagnosis not present

## 2019-03-21 DIAGNOSIS — Z8249 Family history of ischemic heart disease and other diseases of the circulatory system: Secondary | ICD-10-CM | POA: Insufficient documentation

## 2019-03-21 DIAGNOSIS — I2510 Atherosclerotic heart disease of native coronary artery without angina pectoris: Secondary | ICD-10-CM

## 2019-03-21 DIAGNOSIS — F17210 Nicotine dependence, cigarettes, uncomplicated: Secondary | ICD-10-CM | POA: Diagnosis not present

## 2019-03-21 DIAGNOSIS — E785 Hyperlipidemia, unspecified: Secondary | ICD-10-CM | POA: Diagnosis not present

## 2019-03-21 DIAGNOSIS — F17200 Nicotine dependence, unspecified, uncomplicated: Secondary | ICD-10-CM

## 2019-03-21 DIAGNOSIS — N183 Chronic kidney disease, stage 3 unspecified: Secondary | ICD-10-CM

## 2019-03-21 DIAGNOSIS — E1151 Type 2 diabetes mellitus with diabetic peripheral angiopathy without gangrene: Secondary | ICD-10-CM | POA: Diagnosis not present

## 2019-03-21 DIAGNOSIS — I130 Hypertensive heart and chronic kidney disease with heart failure and stage 1 through stage 4 chronic kidney disease, or unspecified chronic kidney disease: Secondary | ICD-10-CM | POA: Insufficient documentation

## 2019-03-21 LAB — LIPID PANEL
Cholesterol: 100 mg/dL (ref 0–200)
HDL: 33 mg/dL — ABNORMAL LOW (ref >40)
LDL Cholesterol: 50 mg/dL (ref 0–99)
Total CHOL/HDL Ratio: 3 RATIO
Triglycerides: 87 mg/dL (ref <150)
VLDL: 17 mg/dL (ref 0–40)

## 2019-03-21 LAB — BASIC METABOLIC PANEL
Anion gap: 10 (ref 5–15)
BUN: 39 mg/dL — ABNORMAL HIGH (ref 8–23)
CO2: 20 mmol/L — ABNORMAL LOW (ref 22–32)
Calcium: 9.2 mg/dL (ref 8.9–10.3)
Chloride: 107 mmol/L (ref 98–111)
Creatinine, Ser: 1.91 mg/dL — ABNORMAL HIGH (ref 0.61–1.24)
GFR calc Af Amer: 41 mL/min — ABNORMAL LOW (ref >60)
GFR calc non Af Amer: 35 mL/min — ABNORMAL LOW (ref >60)
Glucose, Bld: 99 mg/dL (ref 70–99)
Potassium: 3.9 mmol/L (ref 3.5–5.1)
Sodium: 137 mmol/L (ref 135–145)

## 2019-03-21 MED ORDER — SPIRONOLACTONE 25 MG PO TABS: 12.5000 mg | ORAL_TABLET | Freq: Every day | ORAL | 1 refills | Status: DC

## 2019-03-21 NOTE — Patient Instructions (Signed)
Labs done today. We will contact you only if your labs are abnormal.  RESTART Spirolactone 12.5mg (1/2 tab) by mouth once daily.  Your physician recommends that you schedule a follow-up appointment in: 10 days for a lab only appointment and a follow up in 2 months with the PA/NP clinic.  At the [facility], you and your health needs are our priority. As part of our continuing mission to provide you with exceptional heart care, we have created designated Provider Care Teams. These Care Teams include your primary Cardiologist (physician) and Advanced Practice Providers (APPs- Physician Assistants and Nurse Practitioners) who all work together to provide you with the care you need, when you need it.   You may see any of the following providers on your designated Care Team at your next follow up: Marland Kitchen Dr Glori Bickers . Dr Loralie Champagne . Darrick Grinder, NP   Please be sure to bring in all your medications bottles to every appointment.

## 2019-03-22 NOTE — Progress Notes (Signed)
Patient ID: [name] [name], male   DOB: 09-27-1950, 68 y.o.   MRN: [id number] PCP: [name] [facility]) Cardiac Surgeon: Dr Roxan Hockey Pulmonologist: Dr. Lamonte Sakai Cardiology: Dr. Aundra Dubin  HPI: [name] [name] is a 68 y.o. with history of COPD, HTN, 3V CAD s/p CABG x 5  (CABG 04/2013: LIMA to LAD, SVG to second diagonal, SVG to ramus intermediate and obtuse marginal 2, SVG to posterior descending), ischemic cardiomyopathy, chronic systolic HF, COPD, DM2, PVD and tobacco abuse. St Jude ICD.   Lexiscan Cardiolite in 12/19 showed inferior infarct, no ischemia.  Echo in 12/19 showed EF 35%, mild LVH, mildly decreased RV systolic function.  ABIs in 12/19 were stable compared to the past.    He was seen in the ER at [facility] earlier this month with atypical chest pain, Hs-TnI was not elevated and he was sent home.  He had further chest pain, located in left axilla and radiating to the back.  He was admitted at [facility].  Per his report, he was ruled out for MI.  It sounds like he had a chest CT.  There was initial concern for malignancy but it sounds like they ended up thinking he had ?PNA.  He was sent home on antibiotics.   He returns for followup of CHF and CAD. Still smoking 1/2 ppd.  He still has occasional left axillary area pain, not related to exertion. No significant dyspnea walking around his house and short distances outside.  No orthopnea/PND.  He is currently taking Lasix 40 mg bid.  Creatinine has been up recently, patient also had been taking ibuprofen (has stopped).    Labs (11/19): K 4.6, creatinine 2.05 Labs(12/19): K 4.3, creatinine 2.07, LDL 57  Labs (9/20): K 4.2, creatinine 2.09    PMH: 1. Chronic systolic CHF: Ischemic cardiomyopathy.  St Jude ICD.  - ECHO 06/26/13 EF 20-25% RV mild HK - ECHO 10/10/13 EF 20-25% restrictive filling pattern. Moderate RV dysfunction. Mild MR. - Cardiolite (10/16) with EF 19%, anterior ischemia.  - Echo 7/17 EF 30-35% - Echo (12/19): EF 35%, mild LVH,  basal-mid inferior akinesis, mildly decreased RV systolic function, mild MR.  2. CAD: CABG x 5 11/14 with LIMA-LAD, SVG-D2, seq SVG-ramus/OM2, SVG-PDA.  - LHC (10/16) with patent LIMA-LAD and 60% pLAD stenosis (competitive flow). SVG-D, sequential SVG-ramus/OM2, SVG-PDA all patent. Medical management.  - Lexiscan Cardiolite in 12/19 showed inferior infarction, no ischemia.  3. COPD: Active smoker.  4. PAD:  - ABIs 11/07/13: RABI .63 moderate arterial occlusive dz,  LABI .78 moderate arterial occlusive dz - ABIs 9/16: RABI .68, LABI 0.75 - ABIs 12/19: right ABI 0.75, left ABI 0.7 5. Type 2 diabetes 6. Hyperlipidemia 7. Osteoarthritis: Left THR in 6/20.  8. Gout  9. Hyperlipidemia 10.  CKD: Stage 3.   SH: Lives with his girlfriend.  Smokes 1/2 ppd. Does not drink alcohol.   FH: 2 brothers died from heart attacks, Mother DM, Dad HTN   ROS: All systems negative except as listed in HPI, PMH and Problem List.  Current Outpatient Medications  Medication Sig Dispense Refill  . albuterol (PROVENTIL HFA;VENTOLIN HFA) 108 (90 BASE) MCG/ACT inhaler Inhale 1 puff into the lungs every 6 (six) hours as needed for wheezing or shortness of breath.    . allopurinol (ZYLOPRIM) 300 MG tablet Take 300 mg by mouth daily.    Marland Kitchen aspirin 81 MG chewable tablet Chew 1 tablet (81 mg total) by mouth 2 (two) times daily. 60 tablet 1  . atorvastatin (  LIPITOR) 20 MG tablet Take 20 mg by mouth daily.    Marland Kitchen azelastine (OPTIVAR) 0.05 % ophthalmic solution Place 1 drop into the left eye 2 (two) times daily.   0  . b complex vitamins tablet Take 1 tablet by mouth daily.    . budesonide-formoterol (SYMBICORT) 80-4.5 MCG/ACT inhaler Inhale 2 puffs into the lungs 2 (two) times a day.     . carvedilol (COREG) 6.25 MG tablet Take 6.25-12.5 mg by mouth 2 (two) times daily with a meal. 2 tablets in the morning. 1 tablet in the afternoon    . colchicine (COLCRYS) 0.6 MG tablet Take 0.6 mg by mouth daily as needed (gout).    Marland Kitchen  docusate sodium (COLACE) 100 MG capsule Take 1 capsule (100 mg total) by mouth 2 (two) times daily. 60 capsule 1  . furosemide (LASIX) 40 MG tablet Take 40 mg by mouth 2 (two) times daily.     . hydrALAZINE (APRESOLINE) 25 MG tablet Take 0.5 tablets (12.5 mg total) by mouth 3 (three) times daily. 45 tablet 3  . HYDROcodone-acetaminophen (NORCO/VICODIN) 5-325 MG tablet Take 1-2 tablets by mouth every 4 (four) hours as needed for moderate pain (pain score 4-6). 30 tablet 0  . ibuprofen (ADVIL) 800 MG tablet     . isosorbide mononitrate (IMDUR) 30 MG 24 hr tablet Take 1 tablet (30 mg total) by mouth daily. 30 tablet 3  . omeprazole (PRILOSEC) 40 MG capsule Take 40 mg by mouth daily.    . ondansetron (ZOFRAN) 4 MG tablet Take 1 tablet (4 mg total) by mouth every 6 (six) hours as needed for nausea. 20 tablet 0  . Potassium Chloride ER 20 MEQ TBCR Take 1 tablet by mouth daily.    . sacubitril-valsartan (ENTRESTO) 24-26 MG Take 1 tablet by mouth 2 (two) times daily.    Marland Kitchen senna (SENOKOT) 8.6 MG TABS tablet Take 1 tablet (8.6 mg total) by mouth 2 (two) times daily. 120 each 0  . tiotropium (SPIRIVA) 18 MCG inhalation capsule Place 18 mcg into inhaler and inhale daily.    Marland Kitchen tiZANidine (ZANAFLEX) 4 MG tablet TAKE 1 TABLET BY MOUTH 3 TIMES A DAY AS NEEDED FOR BACK/MUSCLE PAIN    . spironolactone (ALDACTONE) 25 MG tablet Take 0.5 tablets (12.5 mg total) by mouth daily. 45 tablet 1   No current facility-administered medications for this encounter.     Vitals:   03/21/19 1129  BP: (!) 123/58  Pulse: 97  SpO2: 97%  Weight: 99.7 kg (219 lb 12.8 oz)    Wt Readings from Last 3 Encounters:  03/21/19 99.7 kg (219 lb 12.8 oz)  02/02/19 97.3 kg (214 lb 6.4 oz)  11/30/18 95.7 kg (211 lb)    PHYSICAL EXAM: General: NAD Neck: No JVD, no thyromegaly or thyroid nodule.  Lungs: Rhonchi bilaterally CV: Nondisplaced PMI.  Heart regular S1/S2, no S3/S4, no murmur.  No peripheral edema.  No carotid bruit.  Normal  pedal pulses.  Abdomen: Soft, nontender, no hepatosplenomegaly, no distention.  Skin: Intact without lesions or rashes.  Neurologic: Alert and oriented x 3.  Psych: Normal affect. Extremities: No clubbing or cyanosis.  HEENT: Normal.    ASSESSMENT & PLAN:  1. CAD s/p CABG: Had cath in 10/16 with patent grafts, medical management.  Cardiolite in 12/19 showed no ischemia (just prior infarction). Recent admission to [facility] with atypical chest pain, he had a CT chest and it sounds like he had PNA though I do not have records  and am relying on his history.  - Continue ASA 81 and statin. Check lipids today.  2. Chronic systolic HF: Ischemic cardiomyopathy, St Jude ICD. Echo 12/19 with EF 35%.  NYHA class II symptoms, he is not volume overloaded on exam.  - Continue 9.375 mg bid.   - Continue Entresto 24/26 bid, dose decreased last admission.  - Restart spironolactone 12.5 mg daily.  - Continue hydralazine 12.5 mg tid + Imdur 30.   - BMET today and again in 10 days.  3. COPD/smoking: I strongly encouraged him to quit.  He is willing to try nicotine patches.  4. PAD: Stable claudication and stable ABIs in 12/19. He tries to walk through the pain.  - Needs to quit smoking.  - Continue ASA and statin.  5. CKD: Stage 3.   - BMET today.      Followup NP 2 months.   Loralie Champagne 03/22/2019

## 2019-03-23 ENCOUNTER — Ambulatory Visit (INDEPENDENT_AMBULATORY_CARE_PROVIDER_SITE_OTHER): Payer: Medicare HMO | Admitting: *Deleted

## 2019-03-23 DIAGNOSIS — I130 Hypertensive heart and chronic kidney disease with heart failure and stage 1 through stage 4 chronic kidney disease, or unspecified chronic kidney disease: Secondary | ICD-10-CM | POA: Diagnosis not present

## 2019-03-23 DIAGNOSIS — E1151 Type 2 diabetes mellitus with diabetic peripheral angiopathy without gangrene: Secondary | ICD-10-CM | POA: Diagnosis not present

## 2019-03-23 DIAGNOSIS — E7800 Pure hypercholesterolemia, unspecified: Secondary | ICD-10-CM | POA: Diagnosis not present

## 2019-03-23 DIAGNOSIS — E1140 Type 2 diabetes mellitus with diabetic neuropathy, unspecified: Secondary | ICD-10-CM | POA: Diagnosis not present

## 2019-03-23 DIAGNOSIS — I255 Ischemic cardiomyopathy: Secondary | ICD-10-CM

## 2019-03-23 DIAGNOSIS — I509 Heart failure, unspecified: Secondary | ICD-10-CM | POA: Diagnosis not present

## 2019-03-23 DIAGNOSIS — I2510 Atherosclerotic heart disease of native coronary artery without angina pectoris: Secondary | ICD-10-CM | POA: Diagnosis not present

## 2019-03-23 DIAGNOSIS — J449 Chronic obstructive pulmonary disease, unspecified: Secondary | ICD-10-CM | POA: Diagnosis not present

## 2019-03-23 DIAGNOSIS — N1831 Chronic kidney disease, stage 3a: Secondary | ICD-10-CM | POA: Diagnosis not present

## 2019-03-23 DIAGNOSIS — M109 Gout, unspecified: Secondary | ICD-10-CM | POA: Diagnosis not present

## 2019-03-25 LAB — CUP PACEART REMOTE DEVICE CHECK
Battery Remaining Longevity: 59 mo
Battery Remaining Percentage: 59 %
Battery Voltage: 2.93 V
Brady Statistic RV Percent Paced: 1 %
Date Time Interrogation Session: 20201002144045
HighPow Impedance: 66 Ohm
HighPow Impedance: 66 Ohm
Implantable Lead Implant Date: 20160115
Implantable Lead Location: 753860
Implantable Lead Model: 7122
Implantable Pulse Generator Implant Date: 20160115
Lead Channel Impedance Value: 360 Ohm
Lead Channel Pacing Threshold Amplitude: 0.75 V
Lead Channel Pacing Threshold Pulse Width: 0.5 ms
Lead Channel Sensing Intrinsic Amplitude: 5.3 mV
Lead Channel Setting Pacing Amplitude: 2.5 V
Lead Channel Setting Pacing Pulse Width: 0.5 ms
Lead Channel Setting Sensing Sensitivity: 0.5 mV
Pulse Gen Serial Number: 7233603

## 2019-03-30 ENCOUNTER — Ambulatory Visit (HOSPITAL_COMMUNITY)
Admission: RE | Admit: 2019-03-30 | Discharge: 2019-03-30 | Disposition: A | Discharge status: Home / Self Care | Payer: Medicare HMO | Source: Ambulatory Visit | Attending: Internal Medicine | Admitting: Internal Medicine

## 2019-03-30 ENCOUNTER — Emergency Department (HOSPITAL_COMMUNITY): Payer: Medicare HMO

## 2019-03-30 ENCOUNTER — Other Ambulatory Visit: Payer: Self-pay

## 2019-03-30 ENCOUNTER — Encounter: Payer: Self-pay | Admitting: Cardiology

## 2019-03-30 ENCOUNTER — Emergency Department (HOSPITAL_COMMUNITY)
Admission: EM | Admit: 2019-03-30 | Discharge: 2019-03-30 | Disposition: A | Discharge status: Home / Self Care | Payer: Medicare HMO | Attending: Emergency Medicine | Admitting: Emergency Medicine

## 2019-03-30 ENCOUNTER — Encounter (HOSPITAL_COMMUNITY): Payer: Self-pay | Admitting: Emergency Medicine

## 2019-03-30 DIAGNOSIS — F17210 Nicotine dependence, cigarettes, uncomplicated: Secondary | ICD-10-CM | POA: Diagnosis not present

## 2019-03-30 DIAGNOSIS — I110 Hypertensive heart disease with heart failure: Secondary | ICD-10-CM | POA: Insufficient documentation

## 2019-03-30 DIAGNOSIS — Z96641 Presence of right artificial hip joint: Secondary | ICD-10-CM | POA: Diagnosis not present

## 2019-03-30 DIAGNOSIS — Z79899 Other long term (current) drug therapy: Secondary | ICD-10-CM | POA: Diagnosis not present

## 2019-03-30 DIAGNOSIS — J449 Chronic obstructive pulmonary disease, unspecified: Secondary | ICD-10-CM | POA: Insufficient documentation

## 2019-03-30 DIAGNOSIS — R0789 Other chest pain: Secondary | ICD-10-CM | POA: Diagnosis not present

## 2019-03-30 DIAGNOSIS — R0602 Shortness of breath: Secondary | ICD-10-CM | POA: Diagnosis not present

## 2019-03-30 DIAGNOSIS — Z720 Tobacco use: Secondary | ICD-10-CM

## 2019-03-30 DIAGNOSIS — R079 Chest pain, unspecified: Secondary | ICD-10-CM | POA: Diagnosis not present

## 2019-03-30 DIAGNOSIS — I5022 Chronic systolic (congestive) heart failure: Secondary | ICD-10-CM | POA: Diagnosis not present

## 2019-03-30 DIAGNOSIS — R918 Other nonspecific abnormal finding of lung field: Secondary | ICD-10-CM | POA: Diagnosis not present

## 2019-03-30 DIAGNOSIS — I2510 Atherosclerotic heart disease of native coronary artery without angina pectoris: Secondary | ICD-10-CM | POA: Diagnosis not present

## 2019-03-30 DIAGNOSIS — Z7982 Long term (current) use of aspirin: Secondary | ICD-10-CM | POA: Diagnosis not present

## 2019-03-30 LAB — BASIC METABOLIC PANEL
Anion gap: 9 (ref 5–15)
Anion gap: 9 (ref 5–15)
BUN: 26 mg/dL — ABNORMAL HIGH (ref 8–23)
BUN: 25 mg/dL — ABNORMAL HIGH (ref 8–23)
CO2: 18 mmol/L — ABNORMAL LOW (ref 22–32)
CO2: 20 mmol/L — ABNORMAL LOW (ref 22–32)
Calcium: 9.4 mg/dL (ref 8.9–10.3)
Calcium: 9.7 mg/dL (ref 8.9–10.3)
Chloride: 109 mmol/L (ref 98–111)
Chloride: 109 mmol/L (ref 98–111)
Creatinine, Ser: 1.89 mg/dL — ABNORMAL HIGH (ref 0.61–1.24)
Creatinine, Ser: 1.82 mg/dL — ABNORMAL HIGH (ref 0.61–1.24)
GFR calc Af Amer: 42 mL/min — ABNORMAL LOW (ref >60)
GFR calc Af Amer: 44 mL/min — ABNORMAL LOW (ref >60)
GFR calc non Af Amer: 36 mL/min — ABNORMAL LOW (ref >60)
GFR calc non Af Amer: 38 mL/min — ABNORMAL LOW (ref >60)
Glucose, Bld: 95 mg/dL (ref 70–99)
Glucose, Bld: 95 mg/dL (ref 70–99)
Potassium: 4.3 mmol/L (ref 3.5–5.1)
Potassium: 4.1 mmol/L (ref 3.5–5.1)
Sodium: 136 mmol/L (ref 135–145)
Sodium: 138 mmol/L (ref 135–145)

## 2019-03-30 LAB — CBC
HCT: 37.9 % — ABNORMAL LOW (ref 39.0–52.0)
Hemoglobin: 12.1 g/dL — ABNORMAL LOW (ref 13.0–17.0)
MCH: 28.6 pg (ref 26.0–34.0)
MCHC: 31.9 g/dL (ref 30.0–36.0)
MCV: 89.6 fL (ref 80.0–100.0)
Platelets: 222 10*3/uL (ref 150–400)
RBC: 4.23 MIL/uL (ref 4.22–5.81)
RDW: 15.6 % — ABNORMAL HIGH (ref 11.5–15.5)
WBC: 11.1 10*3/uL — ABNORMAL HIGH (ref 4.0–10.5)
nRBC: 0 % (ref 0.0–0.2)

## 2019-03-30 LAB — TROPONIN I (HIGH SENSITIVITY): Troponin I (High Sensitivity): 9 ng/L (ref <18)

## 2019-03-30 MED ORDER — SODIUM CHLORIDE 0.9% FLUSH: 3.0000 mL | Freq: Once | INTRAVENOUS | Status: DC

## 2019-03-30 MED ORDER — HYDROCODONE-ACETAMINOPHEN 5-325 MG PO TABS: 1.0000 | ORAL_TABLET | ORAL | 0 refills | Status: DC | PRN

## 2019-03-30 MED ORDER — HYDROCODONE-ACETAMINOPHEN 5-325 MG PO TABS
1.0000 | ORAL_TABLET | Freq: Once | ORAL | Status: AC
  Administered 2019-03-30: 1 via ORAL
  Filled 2019-03-30: qty 1

## 2019-03-30 NOTE — Progress Notes (Signed)
Remote ICD transmission.   

## 2019-03-30 NOTE — ED Notes (Signed)
[id number] [place]

## 2019-03-30 NOTE — Discharge Instructions (Signed)
Try to stop smoking. °

## 2019-03-30 NOTE — ED Triage Notes (Signed)
Pt states he has been having CP and SOB for about a week intermittently. States the pain goes through to his back.

## 2019-03-30 NOTE — ED Provider Notes (Signed)
[facility] EMERGENCY DEPARTMENT Provider Note   CSN: [id number] Arrival date & time: 03/30/19  1210     History   Chief Complaint Chief Complaint  Patient presents with   Chest Pain    HPI [name] [name] is a 68 y.o. male.     Pt presents to the ED today with CP that radiates to his left arm.  Pt has had the CP for a few weeks intermittently.  Pt was seen at [facility] in mid-September and was admitted overnight for obs.  He was treated with abx for pna and a repeat chest CT was recommended to be done after cough improves.  The pt was seen in the ED here on 9/21 for the same.  He followed up with Dr. Aundra Dubin on 9/29 and everything was felt to be stable.  He has been taking ibuprofen for his pain which does help.  Pt does still smoke.  CP worse with coughing or with movement.     Past Medical History:  Diagnosis Date   AICD (automatic cardioverter/defibrillator) present    reports no shocks    CAD (coronary artery disease)    a. 04/2013 Cath: LM 10, LAD 40p, 56m, D1 50p, LCX 60p, 63m/100m, OM1 50, OM2 100 L-L collats, RCA 100p;  b. CABG x 5: LIMA->LAD, VG->D2, VG->RI->OM2, VG->PDA.   Chronic systolic CHF (congestive heart failure) ([place])    a. 04/2013 Echo: EF 15-20%, diff HK, sev HK of inf and ant myocardium, dilated LA,  b. EF 20-25% with restrictive filling pattern, mod RV dysfx, mild MR (10/10/2013);  c.  Echo 12/15: EF 25-30%   COPD (chronic obstructive pulmonary disease) ([place])    Diabetes mellitus without complication ([place])    unsure what type but was dx as an adult, takes no meds, check his cbg at home 2 times a week    Emphysema    Gout    Hyperlipidemia    Hypertension    Ischemic cardiomyopathy    Peripheral neuropathy    reprots bilateral lower extemity paresthesia    PVD (peripheral vascular disease) ([place])    a. (10/2013) ABIs: RIGHT 0.63, Waveforms: monophasic;  LEFT 0.78, Waveforms: monophasic   Tobacco abuse    30+ pack-year  history   Transaminitis    a. 04/2013 Abd U/S and CT unremarkable, hepatitis panel neg -->felt to be 2/2 acute R heart failure.    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[place]) 11/30/2018   Avascular necrosis of hip, right ([place]) 11/30/2018   Chest pain 04/08/2015   Abnormal nuclear stress test 04/08/2015   Ischemic cardiomyopathy 07/06/2014   Lumbago 03/13/2014   Special screening for malignant neoplasms, colon 02/09/2014   Normocytic anemia 02/09/2014   Pleural effusion on left 11/28/2013   Atherosclerosis of native arteries of extremity with intermittent claudication ([place]) 11/07/2013   Weakness of both legs 11/07/2013   Smoker 09/19/2013   Numbness in right leg/ Foot 06/14/2013   Pain in limb-Right Leg/foot 06/14/2013   Atherosclerosis of native arteries of the extremities with ulceration(440.23) 73/53/2992   Chronic systolic heart failure ([place]) 05/29/2013   Atrial fibrillation ([place]) 05/29/2013   Chest pain, mid sternal 05/19/2013   S/P CABG x 5 05/08/2013   Right foot ulcer ([place]) 05/04/2013   Coronary atherosclerosis of native coronary artery 42/68/3419   Acute systolic heart failure ([place]) 04/28/2013   Abnormal blood chemistry 12/30/2012   Abnormal LFTs 12/30/2012  Aspiration pneumonia ([place]) 12/30/2012   Chronic congestive heart failure ([place]) 12/27/2012   Leg weakness 12/26/2012   Pneumonia 05/18/2011   Gout 05/17/2011   Respiratory distress, acute 05/17/2011   Hypertension 05/17/2011   COPD (chronic obstructive pulmonary disease) ([place]) 05/17/2011   CHF, acute ([place]) 05/17/2011    Past Surgical History:  Procedure Laterality Date   APPENDECTOMY  as a child   CARDIAC CATHETERIZATION  04/08/2015   Procedure: Left Heart Cath and Cors/Grafts Angiography;  Surgeon: Peter M Martinique, MD;  Location: [place] [facility];  Service: Cardiovascular;;   CORONARY ARTERY BYPASS GRAFT N/A  05/05/2013   Procedure: CORONARY ARTERY BYPASS GRAFTING (CABG) TIMES FIVE USING LEFT INTERNAL MAMMARY ARTERY AND RIGHT AND LEFT SAPHENOUS LEG VEIN HARVESTED ENDOSCOPICALLY;  Surgeon: Melrose Nakayama, MD;  Location: [place];  Service: Open Heart Surgery;  Laterality: N/A;   IMPLANTABLE CARDIOVERTER DEFIBRILLATOR IMPLANT N/A 07/06/2014   Procedure: IMPLANTABLE CARDIOVERTER DEFIBRILLATOR IMPLANT;  Surgeon: Deboraha Sprang, MD;  Location: [facility] CATH LAB;  Service: Cardiovascular;  Laterality: N/A;   LEFT AND RIGHT HEART CATHETERIZATION WITH CORONARY ANGIOGRAM N/A 04/28/2013   Procedure: LEFT AND RIGHT HEART CATHETERIZATION WITH CORONARY ANGIOGRAM;  Surgeon: Burnell Blanks, MD;  Location: [facility] CATH LAB;  Service: Cardiovascular;  Laterality: N/A;   MULTIPLE EXTRACTIONS WITH ALVEOLOPLASTY N/A 05/03/2013   Procedure: Extraction of tooth #s 3,4,5,6,8,9,27 with alveoloplast and maxillary left osseous tuberosity reduction;  Surgeon: Lenn Cal, DDS;  Location: [place];  Service: Oral Surgery;  Laterality: N/A;   TOTAL HIP ARTHROPLASTY Right 11/30/2018   Procedure: TOTAL HIP ARTHROPLASTY ANTERIOR APPROACH;  Surgeon: Rod Can, MD;  Location: [facility];  Service: Orthopedics;  Laterality: Right;        Home Medications    Prior to Admission medications   Medication Sig Start Date End Date Taking? Authorizing Provider  albuterol (PROVENTIL HFA;VENTOLIN HFA) 108 (90 BASE) MCG/ACT inhaler Inhale 1 puff into the lungs every 6 (six) hours as needed for wheezing or shortness of breath.    [provider]  allopurinol (ZYLOPRIM) 300 MG tablet Take 300 mg by mouth daily.    [provider]  aspirin 81 MG chewable tablet Chew 1 tablet (81 mg total) by mouth 2 (two) times daily. 12/01/18   Swinteck, Aaron Edelman, MD  atorvastatin (LIPITOR) 20 MG tablet Take 20 mg by mouth daily.    [provider]  azelastine (OPTIVAR) 0.05 % ophthalmic solution Place 1 drop into the left eye 2  (two) times daily.  04/21/18   [provider]  b complex vitamins tablet Take 1 tablet by mouth daily.    [provider]  budesonide-formoterol (SYMBICORT) 80-4.5 MCG/ACT inhaler Inhale 2 puffs into the lungs 2 (two) times a day.     [provider]  carvedilol (COREG) 6.25 MG tablet Take 6.25-12.5 mg by mouth 2 (two) times daily with a meal. 2 tablets in the morning. 1 tablet in the afternoon 11/26/18   [provider]  colchicine (COLCRYS) 0.6 MG tablet Take 0.6 mg by mouth daily as needed (gout).    [provider]  docusate sodium (COLACE) 100 MG capsule Take 1 capsule (100 mg total) by mouth 2 (two) times daily. 12/01/18   Swinteck, Aaron Edelman, MD  furosemide (LASIX) 40 MG tablet Take 40 mg by mouth 2 (two) times daily.     [provider]  hydrALAZINE (APRESOLINE) 25 MG tablet Take 0.5 tablets (12.5 mg total) by mouth 3 (three) times daily. 02/02/19  05/03/19  Clegg, Amy D, NP  HYDROcodone-acetaminophen (NORCO/VICODIN) 5-325 MG tablet Take 1 tablet by mouth every 4 (four) hours as needed. 03/30/19   Isla Pence, MD  ibuprofen (ADVIL) 800 MG tablet  12/01/18   [provider]  isosorbide mononitrate (IMDUR) 30 MG 24 hr tablet Take 1 tablet (30 mg total) by mouth daily. 02/02/19 05/03/19  Clegg, Amy D, NP  omeprazole (PRILOSEC) 40 MG capsule Take 40 mg by mouth daily.    [provider]  ondansetron (ZOFRAN) 4 MG tablet Take 1 tablet (4 mg total) by mouth every 6 (six) hours as needed for nausea. 12/01/18   Swinteck, Aaron Edelman, MD  Potassium Chloride ER 20 MEQ TBCR Take 1 tablet by mouth daily. 01/25/19   [provider]  sacubitril-valsartan (ENTRESTO) 24-26 MG Take 1 tablet by mouth 2 (two) times daily.    [provider]  senna (SENOKOT) 8.6 MG TABS tablet Take 1 tablet (8.6 mg total) by mouth 2 (two) times daily. 12/01/18   Swinteck, Aaron Edelman, MD  spironolactone (ALDACTONE) 25 MG tablet Take 0.5 tablets (12.5 mg total) by  mouth daily. 03/21/19   Larey Dresser, MD  tiotropium (SPIRIVA) 18 MCG inhalation capsule Place 18 mcg into inhaler and inhale daily.    [provider]  tiZANidine (ZANAFLEX) 4 MG tablet TAKE 1 TABLET BY MOUTH 3 TIMES A DAY AS NEEDED FOR BACK/MUSCLE PAIN 01/25/19   [provider]    Family History Family History  Problem Relation Age of Onset   Diabetes Mother    Hypertension Other    Heart attack Other        Brothers   Stroke Neg Hx     Social History Social History   Tobacco Use   Smoking status: Current Every Day Smoker    Packs/day: 1.00    Years: 30.00    Pack years: 30.00    Types: Cigarettes   Smokeless tobacco: Never Used   Tobacco comment: 1 pk per day ;  11-23-2018 reports he has been trying to quit and has only smoke 1 cigarette in the last couple days   Substance Use Topics   Alcohol use: No   Drug use: Yes    Types: Marijuana    Comment: last night 11-22-2018     Allergies   Penicillins   Review of Systems Review of Systems  Cardiovascular: Positive for chest pain.  All other systems reviewed and are negative.    Physical Exam Updated Vital Signs BP (!) 151/82    Pulse 74    Temp 97.7 F (36.5 C) (Oral)    Resp (!) 9    Ht 5\' 7"  (1.702 m)    Wt 97.5 kg    SpO2 99%    BMI 33.67 kg/m   Physical Exam Vitals signs and nursing note reviewed.  Constitutional:      Appearance: He is well-developed.  HENT:     Head: Normocephalic and atraumatic.  Eyes:     Extraocular Movements: Extraocular movements intact.     Pupils: Pupils are equal, round, and reactive to light.  Neck:     Musculoskeletal: Normal range of motion and neck supple.  Cardiovascular:     Rate and Rhythm: Normal rate and regular rhythm.     Heart sounds: Normal heart sounds.  Pulmonary:     Effort: Pulmonary effort is normal.     Breath sounds: Normal breath sounds.  Abdominal:     General: Bowel sounds are normal.  Palpations: Abdomen is soft.    Musculoskeletal: Normal range of motion.  Skin:    General: Skin is warm and dry.     Capillary Refill: Capillary refill takes less than 2 seconds.  Neurological:     General: No focal deficit present.     Mental Status: He is alert and oriented to person, place, and time.  Psychiatric:        Mood and Affect: Mood normal.        Behavior: Behavior normal.      ED Treatments / Results  Labs (all labs ordered are listed, but only abnormal results are displayed) Labs Reviewed  BASIC METABOLIC PANEL - Abnormal; Notable for the following components:      Result Value   CO2 20 (*)    BUN 25 (*)    Creatinine, Ser 1.82 (*)    GFR calc non Af Amer 38 (*)    GFR calc Af Amer 44 (*)    All other components within normal limits  CBC - Abnormal; Notable for the following components:   WBC 11.1 (*)    Hemoglobin 12.1 (*)    HCT 37.9 (*)    RDW 15.6 (*)    All other components within normal limits  TROPONIN I (HIGH SENSITIVITY)  TROPONIN I (HIGH SENSITIVITY)    EKG EKG Interpretation  Date/Time:  Thursday March 30 2019 12:28:41 EDT Ventricular Rate:  81 PR Interval:  174 QRS Duration: 142 QT Interval:  436 QTC Calculation: 506 R Axis:   -55 Text Interpretation:  Normal sinus rhythm Left axis deviation Right bundle branch block Inferior infarct , age undetermined Abnormal ECG No significant change since last tracing Confirmed by Isla Pence [phone]) on 03/30/2019 6:10:13 PM   Radiology Dg Chest 2 View  Result Date: 03/30/2019 CLINICAL DATA:  Chest pain and shortness of breath 1 week. EXAM: CHEST - 2 VIEW COMPARISON:  03/13/2019 and chest CT 03/07/2019 FINDINGS: Median sternotomy wires and left-sided pacemaker unchanged. Lungs are adequately inflated without acute airspace process or effusion. Focal density peripheral density over the left upper lobe just above the pacemaker unchanged and thought to be suspicious pleural based nodule by CT. Cardiomediastinal silhouette and  remainder of the exam is unchanged. IMPRESSION: No acute cardiopulmonary disease. Stable focal density over the periphery of the left upper lobe thought to be a suspicious pleural-based nodule on recent CT. Electronically Signed   By: Marin Olp M.D.   On: 03/30/2019 13:37   Ct Chest Wo Contrast  Result Date: 03/30/2019 CLINICAL DATA:  Acute respiratory illness. Chest pain and shortness of breath. EXAM: CT CHEST WITHOUT CONTRAST TECHNIQUE: Multidetector CT imaging of the chest was performed following the standard protocol without IV contrast. COMPARISON:  CT chest 03/07/2019 FINDINGS: Cardiovascular: AICD noted. Prior CABG. Coronary, aortic arch, and branch vessel atherosclerotic vascular disease. Mild cardiomegaly. Mediastinum/Nodes: No mediastinal hematoma or adenopathy identified. Mildly dilated gas-filled esophagus. Lungs/Pleura: Peripheral mass in the left upper lobe abutting the pleura, approximately 3.1 by 2.2 cm on image 52/7, as shown on the 03/07/2019 exam. This lesion is concerning for malignancy. Adjacent pleural thickening noted. Calcified pleural based nodule anteriorly along the right upper lobe, image 59/7, stable. Upper Abdomen: Hypodense right renal lesion, nonspecific. Abdominal aortic atherosclerosis. Musculoskeletal: Prominence of epidural adipose tissues in the thoracic spine. IMPRESSION: 1. Peripheral mass in the left upper lobe abutting the pleura, 3.1 by 2.2 cm in long axis, suspicious for malignancy. 2. Other imaging findings of potential clinical significance: Coronary  atherosclerosis. Mild cardiomegaly. Prior CABG. Prominence of epidural adipose tissues in the thoracic spine. Hypodense right renal lesion, nonspecific but statistically likely to be a cyst. 3. Mildly dilated and gas-filled esophagus. Aortic Atherosclerosis (ICD10-I70.0). Electronically Signed   By: Van Clines M.D.   On: 03/30/2019 20:37    Procedures Procedures (including critical care  time)  Medications Ordered in ED Medications  sodium chloride flush (NS) 0.9 % injection 3 mL (has no administration in time range)  HYDROcodone-acetaminophen (NORCO/VICODIN) 5-325 MG per tablet 1 tablet (1 tablet Oral Given 03/30/19 1844)     Initial Impression / Assessment and Plan / ED Course  I have reviewed the triage vital signs and the nursing notes.  Pertinent labs & imaging results that were available during my care of the patient were reviewed by me and considered in my medical decision making (see chart for details).        I think pt's pain is due to the LUL mass he has on CT.  It abuts the pleura which is likely the cause of his pain.  I told pt the bad news and he asked me to speak with his sister.  I spoke to her and went over his diagnosis.  Pt needs to f/u with pulmonology to try to get a piece of tissue for biopsy.  Pt knows to return if worse.  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

## 2019-03-30 NOTE — ED Notes (Signed)
Patient verbalizes understanding of discharge instructions. Opportunity for questioning and answers were provided. Armband removed by staff, pt discharged from ED.  

## 2019-04-04 DIAGNOSIS — R918 Other nonspecific abnormal finding of lung field: Secondary | ICD-10-CM | POA: Diagnosis not present

## 2019-04-04 DIAGNOSIS — R0781 Pleurodynia: Secondary | ICD-10-CM | POA: Diagnosis not present

## 2019-04-04 DIAGNOSIS — I959 Hypotension, unspecified: Secondary | ICD-10-CM | POA: Diagnosis not present

## 2019-04-04 DIAGNOSIS — I509 Heart failure, unspecified: Secondary | ICD-10-CM | POA: Diagnosis not present

## 2019-04-04 DIAGNOSIS — Z6834 Body mass index (BMI) 34.0-34.9, adult: Secondary | ICD-10-CM | POA: Diagnosis not present

## 2019-04-05 ENCOUNTER — Encounter: Payer: Self-pay | Admitting: Pulmonary Disease

## 2019-04-05 ENCOUNTER — Ambulatory Visit (INDEPENDENT_AMBULATORY_CARE_PROVIDER_SITE_OTHER): Payer: Medicare HMO | Admitting: Pulmonary Disease

## 2019-04-05 ENCOUNTER — Other Ambulatory Visit: Payer: Self-pay

## 2019-04-05 VITALS — BP 116/60 | HR 75 | Temp 97.2°F | Ht 67.5 in | Wt 218.2 lb

## 2019-04-05 DIAGNOSIS — R32 Unspecified urinary incontinence: Secondary | ICD-10-CM | POA: Diagnosis not present

## 2019-04-05 DIAGNOSIS — R911 Solitary pulmonary nodule: Secondary | ICD-10-CM | POA: Diagnosis not present

## 2019-04-05 DIAGNOSIS — R0602 Shortness of breath: Secondary | ICD-10-CM

## 2019-04-05 MED ORDER — BUDESONIDE-FORMOTEROL FUMARATE 160-4.5 MCG/ACT IN AERO: 2.0000 | INHALATION_SPRAY | Freq: Two times a day (BID) | RESPIRATORY_TRACT | 6 refills | Status: DC

## 2019-04-05 MED ORDER — ALBUTEROL SULFATE HFA 108 (90 BASE) MCG/ACT IN AERS: 1.0000 | INHALATION_SPRAY | Freq: Four times a day (QID) | RESPIRATORY_TRACT | 6 refills | Status: DC | PRN

## 2019-04-05 NOTE — Progress Notes (Signed)
Subjective:   PATIENT ID: [name] [name] GENDER: male DOB: [birth year], MRN: [id number]   HPI  Chief Complaint  Patient presents with  . Consult    abnormal scan   Reason for Visit: New consult for lung mass  [name] is a 68 year old male active smoker (60 pack-years) who presents for abnormal CT Chest.  He is an active smoker, previously smoking 1.5 ppd which he has recently cut down to 1/2ppd.Marland Kitchen He lives at home alone. His wife passed away from an MI in Oct 28, 2022. Denies shortness of breath. Reports occasional cough that is minimally productive. He reports wheezing after moderate exertion like when he has completed a shopping trip. He takes Symbicort as needed. Does not have an albuterol inhaler. Denies fevers, chills, unintentional weight loss, night sweats. He is able to perform ADLs without assistance.  Social History: 60 pack-years. Cutting down to 1/2ppd  I have personally reviewed patient's past medical/family/social history, allergies, current medications.  Past Medical History:  Diagnosis Date  . AICD (automatic cardioverter/defibrillator) present    reports no shocks   . CAD (coronary artery disease)    a. 04/2013 Cath: LM 10, LAD 40p, 7m, D1 50p, LCX 60p, 25m/100m, OM1 50, OM2 100 L-L collats, RCA 100p;  b. CABG x 5: LIMA->LAD, VG->D2, VG->RI->OM2, VG->PDA.  Marland Kitchen Chronic systolic CHF (congestive heart failure) ([place])    a. 04/2013 Echo: EF 15-20%, diff HK, sev HK of inf and ant myocardium, dilated LA,  b. EF 20-25% with restrictive filling pattern, mod RV dysfx, mild MR (10/10/2013);  c.  Echo 12/15: EF 25-30%  . COPD (chronic obstructive pulmonary disease) ([place])   . Diabetes mellitus without complication ([place])    unsure what type but was dx as an adult, takes no meds, check his cbg at home 2 times a week   . Emphysema   . Gout   . Hyperlipidemia   . Hypertension   . Ischemic cardiomyopathy   . Peripheral neuropathy    reprots bilateral lower extemity paresthesia   .  PVD (peripheral vascular disease) ([place])    a. (10/2013) ABIs: RIGHT 0.63, Waveforms: monophasic;  LEFT 0.78, Waveforms: monophasic  . Tobacco abuse    30+ pack-year history  . Transaminitis    a. 04/2013 Abd U/S and CT unremarkable, hepatitis panel neg -->felt to be 2/2 acute R heart failure.     Family History  Problem Relation Age of Onset  . Diabetes Mother   . Hypertension Other   . Heart attack Other        Brothers  . Stroke Neg Hx      Social History   Occupational History  . Occupation: Retired  Tobacco Use  . Smoking status: Current Every Day Smoker    Packs/day: 1.00    Years: 30.00    Pack years: 30.00    Types: Cigarettes  . Smokeless tobacco: Never Used  . Tobacco comment: 1 pk per day ;  11-23-2018 reports he has been trying to quit and has only smoke 1 cigarette in the last couple days   Substance and Sexual Activity  . Alcohol use: No  . Drug use: Yes    Types: Marijuana    Comment: last night 11-22-2018  . Sexual activity: Not on file    Allergies  Allergen Reactions  . Penicillins Nausea And Vomiting, Swelling and Other (See Comments)    Per Dr Audria Nine Via phone 12/27/12 0400 Swelling , long time ago. Doesn't  remember     Outpatient Medications Prior to Visit  Medication Sig Dispense Refill  . albuterol (PROVENTIL HFA;VENTOLIN HFA) 108 (90 BASE) MCG/ACT inhaler Inhale 1 puff into the lungs every 6 (six) hours as needed for wheezing or shortness of breath.    . allopurinol (ZYLOPRIM) 300 MG tablet Take 300 mg by mouth daily.    Marland Kitchen aspirin 81 MG chewable tablet Chew 1 tablet (81 mg total) by mouth 2 (two) times daily. 60 tablet 1  . atorvastatin (LIPITOR) 20 MG tablet Take 20 mg by mouth daily.    Marland Kitchen azelastine (OPTIVAR) 0.05 % ophthalmic solution Place 1 drop into the left eye 2 (two) times daily.   0  . b complex vitamins tablet Take 1 tablet by mouth daily.    . budesonide-formoterol (SYMBICORT) 80-4.5 MCG/ACT inhaler Inhale 2 puffs into the  lungs 2 (two) times a day.     . carvedilol (COREG) 6.25 MG tablet Take 6.25-12.5 mg by mouth 2 (two) times daily with a meal. 2 tablets in the morning. 1 tablet in the afternoon    . colchicine (COLCRYS) 0.6 MG tablet Take 0.6 mg by mouth daily as needed (gout).    Marland Kitchen docusate sodium (COLACE) 100 MG capsule Take 1 capsule (100 mg total) by mouth 2 (two) times daily. 60 capsule 1  . furosemide (LASIX) 40 MG tablet Take 40 mg by mouth 2 (two) times daily.     . hydrALAZINE (APRESOLINE) 25 MG tablet Take 0.5 tablets (12.5 mg total) by mouth 3 (three) times daily. 45 tablet 3  . HYDROcodone-acetaminophen (NORCO/VICODIN) 5-325 MG tablet Take 1 tablet by mouth every 4 (four) hours as needed. 10 tablet 0  . ibuprofen (ADVIL) 800 MG tablet     . isosorbide mononitrate (IMDUR) 30 MG 24 hr tablet Take 1 tablet (30 mg total) by mouth daily. 30 tablet 3  . omeprazole (PRILOSEC) 40 MG capsule Take 40 mg by mouth daily.    . ondansetron (ZOFRAN) 4 MG tablet Take 1 tablet (4 mg total) by mouth every 6 (six) hours as needed for nausea. 20 tablet 0  . Potassium Chloride ER 20 MEQ TBCR Take 1 tablet by mouth daily.    . sacubitril-valsartan (ENTRESTO) 24-26 MG Take 1 tablet by mouth 2 (two) times daily.    Marland Kitchen senna (SENOKOT) 8.6 MG TABS tablet Take 1 tablet (8.6 mg total) by mouth 2 (two) times daily. 120 each 0  . spironolactone (ALDACTONE) 25 MG tablet Take 0.5 tablets (12.5 mg total) by mouth daily. 45 tablet 1  . tiotropium (SPIRIVA) 18 MCG inhalation capsule Place 18 mcg into inhaler and inhale daily.    Marland Kitchen tiZANidine (ZANAFLEX) 4 MG tablet TAKE 1 TABLET BY MOUTH 3 TIMES A DAY AS NEEDED FOR BACK/MUSCLE PAIN     No facility-administered medications prior to visit.     Review of Systems  Constitutional: Negative for chills, diaphoresis, fever, malaise/fatigue and weight loss.  HENT: Negative for congestion, ear pain and sore throat.   Respiratory: Positive for cough, sputum production, shortness of breath and  wheezing. Negative for hemoptysis.   Cardiovascular: Positive for chest pain. Negative for palpitations and leg swelling.  Gastrointestinal: Negative for abdominal pain, heartburn and nausea.  Genitourinary: Negative for frequency.  Musculoskeletal: Negative for joint pain and myalgias.  Skin: Positive for itching. Negative for rash.  Neurological: Negative for dizziness, weakness and headaches.  Endo/Heme/Allergies: Does not bruise/bleed easily.  Psychiatric/Behavioral: Negative for depression. The patient is not nervous/anxious.  Objective:   Vitals:   04/05/19 1122  BP: 116/60  Pulse: 75  Temp: (!) 97.2 F (36.2 C)  TempSrc: Temporal  SpO2: 100%  Weight: 218 lb 3.2 oz (99 kg)  Height: 5' 7.5" (1.715 m)      Physical Exam: General: Well-appearing, no acute distress HENT: [place], AT Eyes: EOMI, no scleral icterus Respiratory: Diminished breath sounds bilaterally.  No crackles, wheezing or rales Cardiovascular: RRR, -M/R/G, no JVD GI: BS+, soft, nontender Extremities:-Edema,-tenderness Neuro: AAO x4, CNII-XII grossly intact Skin: Intact, no rashes or bruising Psych: Normal mood, normal affect  Data Reviewed:  Imaging: CT Chest 03/30/19 - Left upper lobe peripheral mass measuring ~3x2cm. Background emphysema  PFT: None on file  Labs: CBC    Component Value Date/Time   WBC 11.1 (H) 03/30/2019 1251   RBC 4.23 03/30/2019 1251   HGB 12.1 (L) 03/30/2019 1251   HCT 37.9 (L) 03/30/2019 1251   PLT 222 03/30/2019 1251   MCV 89.6 03/30/2019 1251   MCH 28.6 03/30/2019 1251   MCHC 31.9 03/30/2019 1251   RDW 15.6 (H) 03/30/2019 1251   LYMPHSABS 2.0 02/14/2019 1137   MONOABS 0.8 02/14/2019 1137   EOSABS 0.2 02/14/2019 1137   BASOSABS 0.0 02/14/2019 1137   BMET    Component Value Date/Time   NA 138 03/30/2019 1251   NA 141 08/31/2017 1433   K 4.1 03/30/2019 1251   CL 109 03/30/2019 1251   CO2 20 (L) 03/30/2019 1251   GLUCOSE 95 03/30/2019 1251   BUN 25 (H)  03/30/2019 1251   BUN 33 (H) 08/31/2017 1433   CREATININE 1.82 (H) 03/30/2019 1251   CREATININE 1.35 (H) 12/25/2015 1310   CALCIUM 9.7 03/30/2019 1251   GFRNONAA 38 (L) 03/30/2019 1251   GFRAA 44 (L) 03/30/2019 1251   Imaging, labs and tests noted above have been reviewed independently by me.    Assessment & Plan:   Discussion: 68 year old male 60-pack year smoking history with emphysema who presents for LUL lung mass concerning for malignancy. Based on imaging, lung mass is highly concerning for malignancy. We discussed diagnostic testing including CT-guided biopsy, bronchoscopy and surgery. Will plan for PET/CT to determine best approach for diagnostic testing.  Left lung mass --I am worried this potentially represents cancer. I recommend further testing to determine the best way to diagnose this.  Tests ordered: --PET scan --Pulmonary function tests to evaluate your lungs  Medications ordered: --Symbcort 160-4.5 mcg. Take two puffs twice a day. This is your EVERYDAY inhaler. --Albuterol. Take two puffs as needed. This is your RESCUE inhaler.  Health Maintenance Immunization History  Administered Date(s) Administered  . Influenza Split 03/22/2013  . Influenza, High Dose Seasonal PF 03/20/2019    Orders Placed This Encounter  Procedures  . NM PET Image Initial (PI) Skull Base To Thigh    Please scheduled ASAP    Standing Status:   Future    Standing Expiration Date:   06/04/2020    Order Specific Question:   If indicated for the ordered procedure, I authorize the administration of a radiopharmaceutical per Radiology protocol    Answer:   Yes    Order Specific Question:   Radiology Contrast Protocol - do NOT remove file path    Answer:   [email or web address]  . Pulmonary function test    Standing Status:   Future    Standing Expiration Date:   04/04/2020    Order Specific Question:   Where should this test be performed?  Answer:   [facility]     Order Specific Question:   Full PFT: includes the following: basic spirometry, spirometry pre & post bronchodilator, diffusion capacity (DLCO), lung volumes    Answer:   Full PFT   Meds ordered this encounter  Medications  . albuterol (VENTOLIN HFA) 108 (90 Base) MCG/ACT inhaler    Sig: Inhale 1-2 puffs into the lungs every 6 (six) hours as needed for wheezing or shortness of breath.    Dispense:  6.7 g    Refill:  6  . budesonide-formoterol (SYMBICORT) 160-4.5 MCG/ACT inhaler    Sig: Inhale 2 puffs into the lungs 2 (two) times daily.    Dispense:  1 Inhaler    Refill:  6    Return Follow-up after scan.  [place], MD [facility] 04/05/2019 8:50 AM  Office Number [phone]

## 2019-04-05 NOTE — Patient Instructions (Addendum)
Left lung mass --I am worried this potentially represents cancer. I recommend further testing to determine the best way to diagnose this.  Tests ordered: --PET scan --Pulmonary function tests to evaluate your lungs  Medications ordered: --Symbcort 160-4.5 mcg. Take two puffs twice a day. This is your EVERYDAY inhaler. --Albuterol. Take two puffs as needed. This is your RESCUE inhaler.  Follow-up with me after PFTs and PET scan.

## 2019-04-06 DIAGNOSIS — I2510 Atherosclerotic heart disease of native coronary artery without angina pectoris: Secondary | ICD-10-CM | POA: Diagnosis not present

## 2019-04-06 DIAGNOSIS — E1151 Type 2 diabetes mellitus with diabetic peripheral angiopathy without gangrene: Secondary | ICD-10-CM | POA: Diagnosis not present

## 2019-04-06 DIAGNOSIS — N1831 Chronic kidney disease, stage 3a: Secondary | ICD-10-CM | POA: Diagnosis not present

## 2019-04-06 DIAGNOSIS — E7800 Pure hypercholesterolemia, unspecified: Secondary | ICD-10-CM | POA: Diagnosis not present

## 2019-04-06 DIAGNOSIS — E1140 Type 2 diabetes mellitus with diabetic neuropathy, unspecified: Secondary | ICD-10-CM | POA: Diagnosis not present

## 2019-04-06 DIAGNOSIS — I130 Hypertensive heart and chronic kidney disease with heart failure and stage 1 through stage 4 chronic kidney disease, or unspecified chronic kidney disease: Secondary | ICD-10-CM | POA: Diagnosis not present

## 2019-04-06 DIAGNOSIS — M109 Gout, unspecified: Secondary | ICD-10-CM | POA: Diagnosis not present

## 2019-04-06 DIAGNOSIS — I509 Heart failure, unspecified: Secondary | ICD-10-CM | POA: Diagnosis not present

## 2019-04-06 DIAGNOSIS — J449 Chronic obstructive pulmonary disease, unspecified: Secondary | ICD-10-CM | POA: Diagnosis not present

## 2019-04-07 ENCOUNTER — Telehealth: Payer: Self-pay | Admitting: *Deleted

## 2019-04-07 DIAGNOSIS — Z471 Aftercare following joint replacement surgery: Secondary | ICD-10-CM | POA: Diagnosis not present

## 2019-04-07 DIAGNOSIS — Z96641 Presence of right artificial hip joint: Secondary | ICD-10-CM | POA: Diagnosis not present

## 2019-04-07 NOTE — Telephone Encounter (Signed)
Pt called regarding refill for pain Rx.  [facility] informed pt that he would need to be re-examined by physician/physician assistant to be prescribed additional pain Rx.   Pt verbalized understanding.

## 2019-04-11 ENCOUNTER — Other Ambulatory Visit (HOSPITAL_COMMUNITY)
Admission: RE | Admit: 2019-04-11 | Discharge: 2019-04-11 | Disposition: A | Discharge status: Home / Self Care | Payer: Medicare HMO | Source: Ambulatory Visit | Attending: Pulmonary Disease | Admitting: Pulmonary Disease

## 2019-04-11 DIAGNOSIS — Z20828 Contact with and (suspected) exposure to other viral communicable diseases: Secondary | ICD-10-CM | POA: Insufficient documentation

## 2019-04-11 DIAGNOSIS — Z01812 Encounter for preprocedural laboratory examination: Secondary | ICD-10-CM | POA: Diagnosis not present

## 2019-04-12 LAB — NOVEL CORONAVIRUS, NAA (HOSP ORDER, SEND-OUT TO REF LAB; TAT 18-24 HRS): SARS-CoV-2, NAA: NOT DETECTED

## 2019-04-13 ENCOUNTER — Other Ambulatory Visit: Payer: Self-pay

## 2019-04-13 ENCOUNTER — Encounter (HOSPITAL_COMMUNITY)
Admission: RE | Admit: 2019-04-13 | Discharge: 2019-04-13 | Disposition: A | Discharge status: Home / Self Care | Payer: Medicare HMO | Source: Ambulatory Visit | Attending: Pulmonary Disease | Admitting: Pulmonary Disease

## 2019-04-13 DIAGNOSIS — R911 Solitary pulmonary nodule: Secondary | ICD-10-CM | POA: Diagnosis not present

## 2019-04-13 DIAGNOSIS — I2510 Atherosclerotic heart disease of native coronary artery without angina pectoris: Secondary | ICD-10-CM | POA: Insufficient documentation

## 2019-04-13 DIAGNOSIS — R918 Other nonspecific abnormal finding of lung field: Secondary | ICD-10-CM | POA: Diagnosis not present

## 2019-04-13 LAB — GLUCOSE, CAPILLARY: Glucose-Capillary: 85 mg/dL (ref 70–99)

## 2019-04-13 MED ORDER — FLUDEOXYGLUCOSE F - 18 (FDG) INJECTION
11.1000 | Freq: Once | INTRAVENOUS | Status: AC | PRN
  Administered 2019-04-13: 11.1 via INTRAVENOUS

## 2019-04-14 ENCOUNTER — Ambulatory Visit (HOSPITAL_COMMUNITY)
Admission: RE | Admit: 2019-04-14 | Discharge: 2019-04-14 | Disposition: A | Discharge status: Home / Self Care | Payer: Medicare HMO | Source: Ambulatory Visit | Attending: Pulmonary Disease | Admitting: Pulmonary Disease

## 2019-04-14 ENCOUNTER — Telehealth: Payer: Self-pay | Admitting: Pulmonary Disease

## 2019-04-14 ENCOUNTER — Encounter: Payer: Self-pay | Admitting: Pulmonary Disease

## 2019-04-14 DIAGNOSIS — R911 Solitary pulmonary nodule: Secondary | ICD-10-CM

## 2019-04-14 DIAGNOSIS — R0602 Shortness of breath: Secondary | ICD-10-CM | POA: Diagnosis not present

## 2019-04-14 LAB — PULMONARY FUNCTION TEST
DL/VA % pred: 90 %
DL/VA: 3.74 ml/min/mmHg/L
DLCO cor % pred: 86 %
DLCO cor: 20.76 ml/min/mmHg
DLCO unc % pred: 79 %
DLCO unc: 19.13 ml/min/mmHg
FEF 25-75 Post: 1.74 L/sec
FEF 25-75 Pre: 1.64 L/sec
FEF2575-%Change-Post: 6 %
FEF2575-%Pred-Post: 74 %
FEF2575-%Pred-Pre: 70 %
FEV1-%Change-Post: 1 %
FEV1-%Pred-Post: 86 %
FEV1-%Pred-Pre: 85 %
FEV1-Post: 2.26 L
FEV1-Pre: 2.23 L
FEV1FVC-%Change-Post: 0 %
FEV1FVC-%Pred-Pre: 94 %
FEV6-%Change-Post: 0 %
FEV6-%Pred-Post: 91 %
FEV6-%Pred-Pre: 91 %
FEV6-Post: 3.01 L
FEV6-Pre: 3.02 L
FEV6FVC-%Change-Post: -1 %
FEV6FVC-%Pred-Post: 101 %
FEV6FVC-%Pred-Pre: 103 %
FVC-%Change-Post: 1 %
FVC-%Pred-Post: 90 %
FVC-%Pred-Pre: 88 %
FVC-Post: 3.11 L
FVC-Pre: 3.06 L
Post FEV1/FVC ratio: 73 %
Post FEV6/FVC ratio: 97 %
Pre FEV1/FVC ratio: 73 %
Pre FEV6/FVC Ratio: 99 %
RV % pred: 225 %
RV: 5.01 L
TLC % pred: 124 %
TLC: 7.95 L

## 2019-04-14 MED ORDER — ALBUTEROL SULFATE (2.5 MG/3ML) 0.083% IN NEBU
2.5000 mg | INHALATION_SOLUTION | Freq: Once | RESPIRATORY_TRACT | Status: AC
  Administered 2019-04-14: 2.5 mg via RESPIRATORY_TRACT

## 2019-04-14 NOTE — Telephone Encounter (Signed)
Attempted to call patient x2. On 10/22 and 10/23, no answer. Left message to call back office regarding urgent results and needing to schedule urgent procedure. Will route message to staff and NP of the day to try to contact patient again later today.   For the NP of the day: Please communicate that PET/CT is concerning for malignancy and includes his LUL nodule and his mediastinal lymph nodes. I recommend bronchoscopy with endobronchial ultrasound for tissue sampling which we discussed at our last visit.   I am available to do the procedure Thursday afternoon 04/20/19 (anytime after 12 pm). This should be scheduled with the OR at [facility]. Patient will need COVID-19 testing prior to this. He will need a driver on the day of the procedure.  His medications have been reviewed. Please ask patient to hold on taking aspirin or ibuprofen 48 hours prior to the procedure.  Please contact me for any questions or concerns.  JE

## 2019-04-14 NOTE — Telephone Encounter (Signed)
I had left vm for pt's sister since they had been communicating with her

## 2019-04-14 NOTE — Telephone Encounter (Signed)
Urgent order placed for EBUS

## 2019-04-14 NOTE — Telephone Encounter (Signed)
Attempted to call pt's sister and was unable to reach her.  Called and spoke to patient, scheduled patient for pre-procedure covid testing for Monday. Patient wants to go before OV on Monday.  If he forgets to go to prior to OV we will need to remind him to go directly there after OV.

## 2019-04-14 NOTE — Addendum Note (Signed)
Addended by: Amado Coe on: 04/14/2019 11:07 AM   Modules accepted: Orders

## 2019-04-14 NOTE — Telephone Encounter (Signed)
04/14/2019 1050  Left voicemail for patient to call our office back. Contacted patient's sister Alphonsa Gin at her mobile number listed: [phone]. Patient sister is on file for PHI.  Patient sister reported that patient is currently getting his pulmonary function test completed at the hospital.  I see that he is arrived for this procedure.  Patient sister admits the patient struggles sometimes with follow-up as well as comprehending complex medical information.  Based of the information provided as well as the short timeframe needed to coordinate best patient care I did disclose PET scan results as well as CT imaging results with patient sister.  She agrees the patient should proceed forward with bronchoscopy/EBUS.  We will work to get this coordinated.  Delphine also made aware of patient's upcoming appointment on 04/17/2019 with Dr. Loanne Drilling at 1:30 PM.  Patient to keep this office visit so that way they can further review PET scan results as well as upcoming procedure that will be planned to be completed on 04/20/2019 in the afternoon.  Patient sister made aware the patient will need another Covid test as the 1 that he received on 04/11/2019 will not be within 7 days of the 04/20/2019 procedure.  I will coordinate with Burman Nieves to ensure the patient gets another Covid test.  Likely best day for patient to be scheduled would be on 04/17/2019 as he will already be coming to our office that day for an appointment.  Lauren, Can you go ahead and place the order for the procedure?  Can you coordinate with the PCC's the Dr. Loanne Drilling is looking to complete a bronchoscopy with endobronchial ultrasound for tissue sampling on 04/20/2019 anytime after 12 PM schedule with OR at [facility].  Patient sister also made aware that patient should not take aspirin or ibuprofen 48 hours prior to the procedure.  Patient sister also provided our telephone number for her to contact the patient directly today  discussed these results as well as to encourage follow-up with our office if he has additional questions or concerns.  Patient sister emphasized multiple times that we can feel free to contact her at her mobile device listed above anytime regarding the patient so she could help coordinate patient's care and help him get the care that he needs.  She is currently working as a Psychologist, occupational at [employer], which is why we are unable to coordinate a televisit with the patient.  Wyn Quaker FNP

## 2019-04-17 ENCOUNTER — Other Ambulatory Visit (HOSPITAL_COMMUNITY)
Admission: RE | Admit: 2019-04-17 | Discharge: 2019-04-17 | Disposition: A | Discharge status: Home / Self Care | Payer: Medicare HMO | Source: Ambulatory Visit | Attending: Pulmonary Disease | Admitting: Pulmonary Disease

## 2019-04-17 ENCOUNTER — Ambulatory Visit: Payer: Medicare HMO | Admitting: Pulmonary Disease

## 2019-04-17 DIAGNOSIS — Z01812 Encounter for preprocedural laboratory examination: Secondary | ICD-10-CM | POA: Diagnosis not present

## 2019-04-17 DIAGNOSIS — Z20828 Contact with and (suspected) exposure to other viral communicable diseases: Secondary | ICD-10-CM | POA: Diagnosis not present

## 2019-04-18 ENCOUNTER — Other Ambulatory Visit (HOSPITAL_COMMUNITY): Payer: Self-pay | Admitting: Adult Health

## 2019-04-18 LAB — NOVEL CORONAVIRUS, NAA (HOSP ORDER, SEND-OUT TO REF LAB; TAT 18-24 HRS): SARS-CoV-2, NAA: NOT DETECTED

## 2019-04-19 ENCOUNTER — Encounter (HOSPITAL_COMMUNITY): Payer: Self-pay | Admitting: *Deleted

## 2019-04-19 ENCOUNTER — Other Ambulatory Visit (HOSPITAL_COMMUNITY): Payer: Self-pay | Admitting: Adult Health

## 2019-04-19 ENCOUNTER — Telehealth: Payer: Self-pay | Admitting: Pulmonary Disease

## 2019-04-19 NOTE — Telephone Encounter (Signed)
Pre-op bronchoscopy orders placed. To be released by RN on day of procedure.

## 2019-04-19 NOTE — Progress Notes (Signed)
Anesthesia Chart Review: Same day workup  Follows with cardiology for hx of CAD s/p CABG x 5 2014, ischemic cardiomyopathy s/p St Jude ICD, chronic systolic HF(EF 01%). Lexiscan Cardiolite in 12/19 showed inferior infarct, no ischemia. Echo in 12/19 showed EF 35%, mild LVH, mildly decreased RV systolic function. ABIs in 12/19 were stable compared to the past. He was last seen by Dr. Aundra Dubin 03/21/19. Per note "Recent admission to [facility] with atypical chest pain, he had a CT chest and it sounds like he had PNA though I do not have records and am relying on his history." Otherwise pt was reportedly stable from cardiac standpoint, recommended 45mo f/u.  S/P recent R hip replacement without complication 0/27/25.   Will need DOS labs and eval.  PFT 04/14/19: FVC                    88% FEV1                   85% Pre FEV1/FVC   73% DLCO                 79 %   Nuclear stress 06/09/18:  Nuclear stress EF: 29%.  There was no ST segment deviation noted during stress.  This is a high risk study.  The left ventricular ejection fraction is severely decreased (<30%).   1. EF 29%, diffuse hypokinesis.  2. Fixed medium-sized, mild basal to mid inferior perfusion defect.  No evidence for ischemia, cannot rule out area of prior infarction.   High risk study due to low EF.    TTE 06/08/18: - Left ventricle: The cavity size was normal. Wall thickness was   increased in a pattern of mild LVH. Basal to mid inferolateral   severe hypokinesis. Basal to mid inferior akinesis. The estimated   ejection fraction was 35%. Doppler parameters are consistent with   abnormal left ventricular relaxation (grade 1 diastolic   dysfunction). - Aortic valve: There was no stenosis. - Mitral valve: Mildly calcified annulus. There was mild   regurgitation. - Left atrium: The atrium was mildly dilated. - Right ventricle: The cavity size was normal. Pacer wire or   catheter noted in right ventricle.  Systolic function was mildly   reduced. - Tricuspid valve: Peak RV-RA gradient (S): 26 mm Hg. - Pulmonary arteries: PA peak pressure: 29 mm Hg (S). - Inferior vena cava: The vessel was normal in size. The   respirophasic diameter changes were in the normal range (>= 50%),   consistent with normal central venous pressure.  Impressions:  - Normal LV size with mild LV hypertrophy. EF 35% with wall motion   abnormalities as noted above. Normal RV size with mildly   decreased systolic function. Mild MR.    Wynonia Musty [facility]/Anesthesiology Phone [phone] 04/19/2019 4:33 PM

## 2019-04-19 NOTE — Progress Notes (Addendum)
Patient denies shortness of breath, fever, cough and chest pain.  PCP - Dr [name] Cardiologist - Dr Haroldine Laws Pulmonary - Dr Loanne Drilling  Chest x-ray - 03/30/19 EKG - 04/10/19 Stress Test -  ECHO - 06/08/18 Cardiac Cath - 04/08/15  ICD Pacemaker/Loop- Patient has a St Jude ICD, periop rx faxed to device clinic, [facility]le [employer] (Rep) called at 1640 and informed of pt's surgery. Last device remote check was on 03/24/19.  Fasting Blood Sugar - unknown Checks Blood Sugar _2__times a week Patient does not take any DM medications  Aspirin Instructions: Follow your surgeon's instructions on when to stop aspirin prior to surgery,  If no instructions were given by your surgeon then you will need to call the office for those instructions.  Anesthesia review: Yes, Terald Sleeper now taking any Aspirin (unless otherwise instructed by your surgeon), Aleve, Naproxen, Ibuprofen, Motrin, Advil, Goody's, BC's, all herbal medications, fish oil, and all vitamins.   Coronavirus Screening Covid test on 04/17/19 was negative.  Patient was unsure of what meds to take on DOS.  I spelled all meds out for him to write down.  Someone puts his meds in pill container for 3 wks at a time.  Asked him to reach out to her so she can help him with his medications that he is allowed to take on DOS.

## 2019-04-19 NOTE — Anesthesia Preprocedure Evaluation (Addendum)
Anesthesia Evaluation  Patient identified by MRN, date of birth, ID band Patient awake    Reviewed: Allergy & Precautions, NPO status , Patient's Chart, lab work & pertinent test results  Airway Mallampati: II  TM Distance: >3 FB Neck ROM: Full    Dental  (+) Edentulous Upper, Edentulous Lower   Pulmonary COPD, Current SmokerPatient did not abstain from smoking.,  1ppd for 60 years  PFT 04/14/19: FVC                    88% FEV1                   85% Pre FEV1/FVC   73% DLCO                 79 %    Pulmonary exam normal        Cardiovascular hypertension, + CAD, + Peripheral Vascular Disease and +CHF  Normal cardiovascular exam+ Cardiac Defibrillator   s/p CABG x 5 2014, ischemic cardiomyopathy s/p St Jude ICD, chronic systolic HF(EF 47%)    Nuclear stress 06/09/18: Nuclear stress EF: 29%. There was no ST segment deviation noted during stress. This is a high risk study. The left ventricular ejection fraction is severely decreased (<30%). 1. EF 29%, diffuse hypokinesis.  2. Fixed medium-sized, mild basal to mid inferior perfusion defect.  No evidence for ischemia, cannot rule out area of prior infarction.   TTE 06/08/18: - Left ventricle: The cavity size was normal. Wall thickness was increased in a pattern of mild LVH. Basal to mid inferolateral severe hypokinesis. Basal to mid inferior akinesis. The estimated ejection fraction was 35%. Doppler parameters are consistent with abnormal left ventricular relaxation (grade 1 diastolic dysfunction). - Aortic valve: There was no stenosis. - Mitral valve: Mildly calcified annulus. There was mild   regurgitation. - Left atrium: The atrium was mildly dilated. - Right ventricle: The cavity size was normal. Pacer wire or   catheter noted in right ventricle. Systolic function was mildly   reduced. - Tricuspid valve: Peak RV-RA gradient (S): 26 mm Hg. - Pulmonary arteries: PA peak  pressure: 29 mm Hg (S). - Inferior vena cava: The vessel was normal in size. The   respirophasic diameter changes were in the normal range (>= 50%),   consistent with normal central venous pressure.  Impressions: Normal LV size with mild LV hypertrophy. EF 35% with wall motion abnormalities as noted above. Normal RV size with mildly decreased systolic function. Mild MR.    Neuro/Psych negative neurological ROS  negative psych ROS   GI/Hepatic GERD  Medicated,(+)     substance abuse  marijuana use,   Endo/Other  diabetes, Type 2  Renal/GU CRFRenal diseaseLast cr 1.69  negative genitourinary   Musculoskeletal  (+) Arthritis , Osteoarthritis,    Abdominal Normal abdominal exam  (+)   Peds  Hematology negative hematology ROS (+)   Anesthesia Other Findings   Reproductive/Obstetrics negative OB ROS                            Anesthesia Physical Anesthesia Plan  ASA: IV  Anesthesia Plan: General   Post-op Pain Management:    Induction: Intravenous  PONV Risk Score and Plan: 1 and Ondansetron and Treatment may vary due to age or medical condition  Airway Management Planned: Oral ETT  Additional Equipment: None  Intra-op Plan:   Post-operative Plan: Extubation in OR  Informed Consent: I  have reviewed the patients History and Physical, chart, labs and discussed the procedure including the risks, benefits and alternatives for the proposed anesthesia with the patient or authorized representative who has indicated his/her understanding and acceptance.     Dental advisory given  Plan Discussed with: CRNA  Anesthesia Plan Comments: (    )      Anesthesia Quick Evaluation

## 2019-04-19 NOTE — Telephone Encounter (Signed)
Called and spoke to staff at [facility]  Order for tomorrow's procedure need to be placed.   Routing to Dr. Loanne Drilling.

## 2019-04-20 ENCOUNTER — Ambulatory Visit (HOSPITAL_COMMUNITY): Payer: Medicare HMO | Admitting: Physician Assistant

## 2019-04-20 ENCOUNTER — Ambulatory Visit (HOSPITAL_COMMUNITY): Payer: Medicare HMO

## 2019-04-20 ENCOUNTER — Other Ambulatory Visit: Payer: Self-pay

## 2019-04-20 ENCOUNTER — Encounter (HOSPITAL_COMMUNITY)
Admission: RE | Disposition: A | Discharge status: Home / Self Care | Payer: Self-pay | Source: Home / Self Care | Attending: Pulmonary Disease

## 2019-04-20 ENCOUNTER — Encounter (HOSPITAL_COMMUNITY): Payer: Self-pay | Admitting: *Deleted

## 2019-04-20 ENCOUNTER — Ambulatory Visit (HOSPITAL_COMMUNITY)
Admission: RE | Admit: 2019-04-20 | Discharge: 2019-04-20 | Disposition: A | Discharge status: Home / Self Care | Payer: Medicare HMO | Attending: Pulmonary Disease | Admitting: Pulmonary Disease

## 2019-04-20 DIAGNOSIS — Z880 Allergy status to penicillin: Secondary | ICD-10-CM | POA: Insufficient documentation

## 2019-04-20 DIAGNOSIS — Z8249 Family history of ischemic heart disease and other diseases of the circulatory system: Secondary | ICD-10-CM | POA: Diagnosis not present

## 2019-04-20 DIAGNOSIS — N1831 Chronic kidney disease, stage 3a: Secondary | ICD-10-CM | POA: Diagnosis not present

## 2019-04-20 DIAGNOSIS — K219 Gastro-esophageal reflux disease without esophagitis: Secondary | ICD-10-CM | POA: Diagnosis not present

## 2019-04-20 DIAGNOSIS — E785 Hyperlipidemia, unspecified: Secondary | ICD-10-CM | POA: Diagnosis not present

## 2019-04-20 DIAGNOSIS — C781 Secondary malignant neoplasm of mediastinum: Secondary | ICD-10-CM | POA: Diagnosis not present

## 2019-04-20 DIAGNOSIS — Z95810 Presence of automatic (implantable) cardiac defibrillator: Secondary | ICD-10-CM | POA: Diagnosis not present

## 2019-04-20 DIAGNOSIS — Z833 Family history of diabetes mellitus: Secondary | ICD-10-CM | POA: Insufficient documentation

## 2019-04-20 DIAGNOSIS — R911 Solitary pulmonary nodule: Secondary | ICD-10-CM | POA: Diagnosis not present

## 2019-04-20 DIAGNOSIS — M109 Gout, unspecified: Secondary | ICD-10-CM | POA: Insufficient documentation

## 2019-04-20 DIAGNOSIS — Z01818 Encounter for other preprocedural examination: Secondary | ICD-10-CM | POA: Diagnosis not present

## 2019-04-20 DIAGNOSIS — I110 Hypertensive heart disease with heart failure: Secondary | ICD-10-CM | POA: Insufficient documentation

## 2019-04-20 DIAGNOSIS — I5022 Chronic systolic (congestive) heart failure: Secondary | ICD-10-CM | POA: Insufficient documentation

## 2019-04-20 DIAGNOSIS — E1122 Type 2 diabetes mellitus with diabetic chronic kidney disease: Secondary | ICD-10-CM | POA: Diagnosis not present

## 2019-04-20 DIAGNOSIS — Z79899 Other long term (current) drug therapy: Secondary | ICD-10-CM | POA: Insufficient documentation

## 2019-04-20 DIAGNOSIS — Z7982 Long term (current) use of aspirin: Secondary | ICD-10-CM | POA: Insufficient documentation

## 2019-04-20 DIAGNOSIS — I2510 Atherosclerotic heart disease of native coronary artery without angina pectoris: Secondary | ICD-10-CM | POA: Diagnosis not present

## 2019-04-20 DIAGNOSIS — I5023 Acute on chronic systolic (congestive) heart failure: Secondary | ICD-10-CM | POA: Diagnosis not present

## 2019-04-20 DIAGNOSIS — N189 Chronic kidney disease, unspecified: Secondary | ICD-10-CM | POA: Diagnosis not present

## 2019-04-20 DIAGNOSIS — Z791 Long term (current) use of non-steroidal anti-inflammatories (NSAID): Secondary | ICD-10-CM | POA: Insufficient documentation

## 2019-04-20 DIAGNOSIS — J449 Chronic obstructive pulmonary disease, unspecified: Secondary | ICD-10-CM | POA: Diagnosis not present

## 2019-04-20 DIAGNOSIS — F17210 Nicotine dependence, cigarettes, uncomplicated: Secondary | ICD-10-CM | POA: Insufficient documentation

## 2019-04-20 DIAGNOSIS — Z96641 Presence of right artificial hip joint: Secondary | ICD-10-CM | POA: Diagnosis not present

## 2019-04-20 DIAGNOSIS — C969 Malignant neoplasm of lymphoid, hematopoietic and related tissue, unspecified: Secondary | ICD-10-CM | POA: Diagnosis not present

## 2019-04-20 DIAGNOSIS — I255 Ischemic cardiomyopathy: Secondary | ICD-10-CM | POA: Insufficient documentation

## 2019-04-20 DIAGNOSIS — Z9889 Other specified postprocedural states: Secondary | ICD-10-CM

## 2019-04-20 DIAGNOSIS — E1151 Type 2 diabetes mellitus with diabetic peripheral angiopathy without gangrene: Secondary | ICD-10-CM | POA: Diagnosis not present

## 2019-04-20 DIAGNOSIS — Z7951 Long term (current) use of inhaled steroids: Secondary | ICD-10-CM | POA: Insufficient documentation

## 2019-04-20 DIAGNOSIS — E7800 Pure hypercholesterolemia, unspecified: Secondary | ICD-10-CM | POA: Diagnosis not present

## 2019-04-20 DIAGNOSIS — I130 Hypertensive heart and chronic kidney disease with heart failure and stage 1 through stage 4 chronic kidney disease, or unspecified chronic kidney disease: Secondary | ICD-10-CM | POA: Diagnosis not present

## 2019-04-20 DIAGNOSIS — Z951 Presence of aortocoronary bypass graft: Secondary | ICD-10-CM | POA: Diagnosis not present

## 2019-04-20 DIAGNOSIS — I509 Heart failure, unspecified: Secondary | ICD-10-CM | POA: Diagnosis not present

## 2019-04-20 DIAGNOSIS — J439 Emphysema, unspecified: Secondary | ICD-10-CM | POA: Insufficient documentation

## 2019-04-20 DIAGNOSIS — E1140 Type 2 diabetes mellitus with diabetic neuropathy, unspecified: Secondary | ICD-10-CM | POA: Diagnosis not present

## 2019-04-20 DIAGNOSIS — C3412 Malignant neoplasm of upper lobe, left bronchus or lung: Secondary | ICD-10-CM | POA: Diagnosis not present

## 2019-04-20 DIAGNOSIS — R918 Other nonspecific abnormal finding of lung field: Secondary | ICD-10-CM | POA: Diagnosis not present

## 2019-04-20 HISTORY — DX: Unspecified cataract: H26.9

## 2019-04-20 HISTORY — DX: Gastro-esophageal reflux disease without esophagitis: K21.9

## 2019-04-20 HISTORY — PX: VIDEO BRONCHOSCOPY WITH ENDOBRONCHIAL ULTRASOUND: SHX6177

## 2019-04-20 HISTORY — DX: Dependence on other enabling machines and devices: Z99.89

## 2019-04-20 LAB — CBC
HCT: 42.7 % (ref 39.0–52.0)
Hemoglobin: 13.2 g/dL (ref 13.0–17.0)
MCH: 27.3 pg (ref 26.0–34.0)
MCHC: 30.9 g/dL (ref 30.0–36.0)
MCV: 88.4 fL (ref 80.0–100.0)
Platelets: 254 10*3/uL (ref 150–400)
RBC: 4.83 MIL/uL (ref 4.22–5.81)
RDW: 15.6 % — ABNORMAL HIGH (ref 11.5–15.5)
WBC: 12.1 10*3/uL — ABNORMAL HIGH (ref 4.0–10.5)
nRBC: 0 % (ref 0.0–0.2)

## 2019-04-20 LAB — COMPREHENSIVE METABOLIC PANEL
ALT: 12 U/L (ref 0–44)
AST: 16 U/L (ref 15–41)
Albumin: 4.2 g/dL (ref 3.5–5.0)
Alkaline Phosphatase: 59 U/L (ref 38–126)
Anion gap: 10 (ref 5–15)
BUN: 23 mg/dL (ref 8–23)
CO2: 21 mmol/L — ABNORMAL LOW (ref 22–32)
Calcium: 10.1 mg/dL (ref 8.9–10.3)
Chloride: 109 mmol/L (ref 98–111)
Creatinine, Ser: 1.68 mg/dL — ABNORMAL HIGH (ref 0.61–1.24)
GFR calc Af Amer: 48 mL/min — ABNORMAL LOW (ref >60)
GFR calc non Af Amer: 41 mL/min — ABNORMAL LOW (ref >60)
Glucose, Bld: 96 mg/dL (ref 70–99)
Potassium: 3.9 mmol/L (ref 3.5–5.1)
Sodium: 140 mmol/L (ref 135–145)
Total Bilirubin: 0.8 mg/dL (ref 0.3–1.2)
Total Protein: 7.8 g/dL (ref 6.5–8.1)

## 2019-04-20 LAB — GLUCOSE, CAPILLARY
Glucose-Capillary: 94 mg/dL (ref 70–99)
Glucose-Capillary: 106 mg/dL — ABNORMAL HIGH (ref 70–99)

## 2019-04-20 LAB — APTT: aPTT: 32 seconds (ref 24–36)

## 2019-04-20 LAB — PROTIME-INR
INR: 1.1 (ref 0.8–1.2)
Prothrombin Time: 13.8 seconds (ref 11.4–15.2)

## 2019-04-20 SURGERY — BRONCHOSCOPY, WITH EBUS
Anesthesia: General

## 2019-04-20 MED ORDER — ROCURONIUM BROMIDE 10 MG/ML (PF) SYRINGE
PREFILLED_SYRINGE | INTRAVENOUS | Status: AC
  Filled 2019-04-20: qty 10

## 2019-04-20 MED ORDER — ROCURONIUM BROMIDE 50 MG/5ML IV SOSY
PREFILLED_SYRINGE | INTRAVENOUS | Status: DC | PRN
  Administered 2019-04-20: 50 mg via INTRAVENOUS

## 2019-04-20 MED ORDER — ALBUTEROL SULFATE HFA 108 (90 BASE) MCG/ACT IN AERS
INHALATION_SPRAY | RESPIRATORY_TRACT | Status: AC
  Filled 2019-04-20: qty 6.7

## 2019-04-20 MED ORDER — CARVEDILOL 3.125 MG PO TABS
6.2500 mg | ORAL_TABLET | Freq: Once | ORAL | Status: AC
  Administered 2019-04-20: 6.25 mg via ORAL
  Filled 2019-04-20: qty 2

## 2019-04-20 MED ORDER — LABETALOL HCL 5 MG/ML IV SOLN: 5.0000 mg | INTRAVENOUS | Status: DC | PRN

## 2019-04-20 MED ORDER — PROPOFOL 10 MG/ML IV BOLUS
INTRAVENOUS | Status: DC | PRN
  Administered 2019-04-20: 20 mg via INTRAVENOUS
  Administered 2019-04-20: 100 mg via INTRAVENOUS

## 2019-04-20 MED ORDER — FENTANYL CITRATE (PF) 100 MCG/2ML IJ SOLN: 25.0000 ug | INTRAMUSCULAR | Status: DC | PRN

## 2019-04-20 MED ORDER — ALBUTEROL SULFATE HFA 108 (90 BASE) MCG/ACT IN AERS
INHALATION_SPRAY | RESPIRATORY_TRACT | Status: DC | PRN
  Administered 2019-04-20: 4 via RESPIRATORY_TRACT

## 2019-04-20 MED ORDER — METOPROLOL TARTRATE 5 MG/5ML IV SOLN
INTRAVENOUS | Status: AC
  Filled 2019-04-20: qty 5

## 2019-04-20 MED ORDER — DEXAMETHASONE SODIUM PHOSPHATE 10 MG/ML IJ SOLN
INTRAMUSCULAR | Status: DC | PRN
  Administered 2019-04-20: 5 mg via INTRAVENOUS

## 2019-04-20 MED ORDER — ONDANSETRON HCL 4 MG/2ML IJ SOLN
INTRAMUSCULAR | Status: DC | PRN
  Administered 2019-04-20: 4 mg via INTRAVENOUS

## 2019-04-20 MED ORDER — ESMOLOL HCL 100 MG/10ML IV SOLN
INTRAVENOUS | Status: DC | PRN
  Administered 2019-04-20: 20 mg via INTRAVENOUS
  Administered 2019-04-20 (×2): 10 mg via INTRAVENOUS

## 2019-04-20 MED ORDER — PHENYLEPHRINE 40 MCG/ML (10ML) SYRINGE FOR IV PUSH (FOR BLOOD PRESSURE SUPPORT)
PREFILLED_SYRINGE | INTRAVENOUS | Status: AC
  Filled 2019-04-20: qty 10

## 2019-04-20 MED ORDER — DEXAMETHASONE SODIUM PHOSPHATE 10 MG/ML IJ SOLN
INTRAMUSCULAR | Status: AC
  Filled 2019-04-20: qty 1

## 2019-04-20 MED ORDER — FENTANYL CITRATE (PF) 100 MCG/2ML IJ SOLN
INTRAMUSCULAR | Status: DC | PRN
  Administered 2019-04-20 (×4): 50 ug via INTRAVENOUS

## 2019-04-20 MED ORDER — 0.9 % SODIUM CHLORIDE (POUR BTL) OPTIME
TOPICAL | Status: DC | PRN
  Administered 2019-04-20: 1000 mL

## 2019-04-20 MED ORDER — PROPOFOL 10 MG/ML IV BOLUS
INTRAVENOUS | Status: AC
  Filled 2019-04-20: qty 20

## 2019-04-20 MED ORDER — ESMOLOL HCL 100 MG/10ML IV SOLN
INTRAVENOUS | Status: AC
  Filled 2019-04-20: qty 10

## 2019-04-20 MED ORDER — SUGAMMADEX SODIUM 200 MG/2ML IV SOLN
INTRAVENOUS | Status: DC | PRN
  Administered 2019-04-20 (×2): 100 mg via INTRAVENOUS

## 2019-04-20 MED ORDER — LACTATED RINGERS IV SOLN
INTRAVENOUS | Status: DC
  Administered 2019-04-20: 13:00:00 via INTRAVENOUS

## 2019-04-20 MED ORDER — LIDOCAINE 2% (20 MG/ML) 5 ML SYRINGE
INTRAMUSCULAR | Status: AC
  Filled 2019-04-20: qty 5

## 2019-04-20 MED ORDER — FENTANYL CITRATE (PF) 250 MCG/5ML IJ SOLN
INTRAMUSCULAR | Status: AC
  Filled 2019-04-20: qty 5

## 2019-04-20 MED ORDER — PHENYLEPHRINE 40 MCG/ML (10ML) SYRINGE FOR IV PUSH (FOR BLOOD PRESSURE SUPPORT)
PREFILLED_SYRINGE | INTRAVENOUS | Status: DC | PRN
  Administered 2019-04-20: 40 ug via INTRAVENOUS
  Administered 2019-04-20: 80 ug via INTRAVENOUS
  Administered 2019-04-20 (×2): 40 ug via INTRAVENOUS

## 2019-04-20 MED ORDER — LIDOCAINE 2% (20 MG/ML) 5 ML SYRINGE
INTRAMUSCULAR | Status: DC | PRN
  Administered 2019-04-20: 60 mg via INTRAVENOUS

## 2019-04-20 MED ORDER — ONDANSETRON HCL 4 MG/2ML IJ SOLN
INTRAMUSCULAR | Status: AC
  Filled 2019-04-20: qty 2

## 2019-04-20 MED ORDER — METOPROLOL TARTRATE 5 MG/5ML IV SOLN
INTRAVENOUS | Status: DC | PRN
  Administered 2019-04-20 (×2): 2.5 mg via INTRAVENOUS

## 2019-04-20 MED ORDER — EPINEPHRINE PF 1 MG/ML IJ SOLN
INTRAMUSCULAR | Status: AC
  Filled 2019-04-20: qty 1

## 2019-04-20 SURGICAL SUPPLY — 37 items
ADAPTER VALVE BIOPSY EBUS (MISCELLANEOUS) IMPLANT
ADPTR VALVE BIOPSY EBUS (MISCELLANEOUS)
BALLN FOR EBUS SCOPE (BALLOONS) ×3
BALLOON FOR EBUS SCOPE (BALLOONS) ×1 IMPLANT
BRUSH CYTOL CELLEBRITY 1.5X140 (MISCELLANEOUS) IMPLANT
CANISTER SUCT 3000ML PPV (MISCELLANEOUS) ×3 IMPLANT
CONT SPEC 4OZ CLIKSEAL STRL BL (MISCELLANEOUS) ×3 IMPLANT
COVER BACK TABLE 60X90IN (DRAPES) ×3 IMPLANT
FORCEPS BIOP RJ4 1.8 (CUTTING FORCEPS) IMPLANT
GAUZE SPONGE 4X4 12PLY STRL (GAUZE/BANDAGES/DRESSINGS) ×3 IMPLANT
GLOVE BIO SURGEON STRL SZ 6.5 (GLOVE) ×2 IMPLANT
GLOVE BIO SURGEONS STRL SZ 6.5 (GLOVE) ×1
GOWN STRL REUS W/ TWL LRG LVL3 (GOWN DISPOSABLE) ×1 IMPLANT
GOWN STRL REUS W/TWL LRG LVL3 (GOWN DISPOSABLE) ×2
KIT CLEAN ENDO COMPLIANCE (KITS) ×6 IMPLANT
KIT TURNOVER KIT B (KITS) ×3 IMPLANT
MARKER SKIN DUAL TIP RULER LAB (MISCELLANEOUS) ×3 IMPLANT
NDL ASPIRATION VIZISHOT 19G (NEEDLE) IMPLANT
NDL ASPIRATION VIZISHOT 21G (NEEDLE) ×1 IMPLANT
NEEDLE ASPIRATION VIZISHOT 19G (NEEDLE) ×3 IMPLANT
NEEDLE ASPIRATION VIZISHOT 21G (NEEDLE) ×3 IMPLANT
NS IRRIG 1000ML POUR BTL (IV SOLUTION) ×3 IMPLANT
OIL SILICONE PENTAX (PARTS (SERVICE/REPAIRS)) ×3 IMPLANT
PAD ARMBOARD 7.5X6 YLW CONV (MISCELLANEOUS) ×6 IMPLANT
SYR 20ML ECCENTRIC (SYRINGE) ×6 IMPLANT
SYR 20ML LL LF (SYRINGE) ×6 IMPLANT
SYR 50ML SLIP (SYRINGE) IMPLANT
SYR 5ML LUER SLIP (SYRINGE) ×3 IMPLANT
TOWEL GREEN STERILE FF (TOWEL DISPOSABLE) ×3 IMPLANT
TRAP SPECIMEN MUCOUS 40CC (MISCELLANEOUS) ×2 IMPLANT
TUBE CONNECTING 20'X1/4 (TUBING) ×2
TUBE CONNECTING 20X1/4 (TUBING) ×4 IMPLANT
UNDERPAD 30X30 (UNDERPADS AND DIAPERS) ×3 IMPLANT
VALVE BIOPSY  SINGLE USE (MISCELLANEOUS) ×2
VALVE BIOPSY SINGLE USE (MISCELLANEOUS) ×1 IMPLANT
VALVE SUCTION BRONCHIO DISP (MISCELLANEOUS) ×3 IMPLANT
WATER STERILE IRR 1000ML POUR (IV SOLUTION) ×3 IMPLANT

## 2019-04-20 NOTE — Anesthesia Postprocedure Evaluation (Signed)
Anesthesia Post Note  Patient: [name] [name]  Procedure(s) Performed: VIDEO BRONCHOSCOPY WITH ENDOBRONCHIAL ULTRASOUND (N/A )     Patient location during evaluation: PACU Anesthesia Type: General Level of consciousness: awake and alert Pain management: pain level controlled Vital Signs Assessment: post-procedure vital signs reviewed and stable Respiratory status: spontaneous breathing, nonlabored ventilation and respiratory function stable Cardiovascular status: blood pressure returned to baseline and stable Postop Assessment: no apparent nausea or vomiting Anesthetic complications: no    Last Vitals:  Vitals:   04/20/19 1530 04/20/19 1558  BP: (!) 145/85 (!) 154/88  Pulse:  88  Resp:  16  Temp:    SpO2:  95%    Last Pain:  Vitals:   04/20/19 1530  TempSrc:   PainSc: 0-No pain                 Audry Pili

## 2019-04-20 NOTE — H&P (Signed)
NAME:  [name] [name], MRN:  [id number], DOB:  11-08-1950, LOS: 0 ADMISSION DATE:  04/20/2019, CONSULTATION DATE:  04/20/19 REFERRING MD:  Rodman Pickle, MD CHIEF COMPLAINT:  Left upper lobe lung mass  History of present illness   [name] [name] is a 68 year old male active smoker (60 pack-years) with PET-avid left upper lobe mass and left hilar/mediastinal adenopathy who is admitted for bronchoscopy with endobronchial ultrasound with biopsy.  Past Medical History  CHF/ICM s/p PM CAD s/p CABG Emphysema DM2 PVD  Significant Diagnostic Tests:  04/13/19 PET/CT IMPRESSION: Hypermetabolic 3 cm peripheral left upper lobe mass abutting the pleural surface, consistent with primary bronchogenic carcinoma.  Hypermetabolic left hilar and contralateral right paratracheal mediastinal lymph nodes, consistent with metastatic disease.  No evidence of metastatic disease within the neck, abdomen, or Pelvis.  03/30/19 CT Chest IMPRESSION: 1. Peripheral mass in the left upper lobe abutting the pleura, 3.1 by 2.2 cm in long axis, suspicious for malignancy. 2. Other imaging findings of potential clinical significance: Coronary atherosclerosis. Mild cardiomegaly. Prior CABG. Prominence of epidural adipose tissues in the thoracic spine. Hypodense right renal lesion, nonspecific but statistically likely to be a cyst. 3. Mildly dilated and gas-filled esophagus.  Objective   Blood pressure (!) 152/71, pulse 90, temperature 97.7 F (36.5 C), temperature source Oral, resp. rate 20, height 5' 7.5" (1.715 m), weight 98.9 kg, SpO2 100 %.       No intake or output data in the 24 hours ending 04/20/19 1139 Filed Weights   04/19/19 1659 04/20/19 1034  Weight: 98.9 kg 98.9 kg   Physical Exam: General: Well-appearing, no acute distress HENT: [place], AT, OP clear, MMM Eyes: EOMI, no scleral icterus Respiratory: Clear to auscultation bilaterally.  No crackles, wheezing or rales Cardiovascular: RRR, -M/R/G, no  JVD GI: BS+, soft, nontender Extremities:-Edema,-tenderness Neuro: AAO x4, CNII-XII grossly intact Skin: Intact, no rashes or bruising Psych: Normal mood, normal affect  Assessment & Plan:  PET-avid left upper lobe mass and left hilar/mediastinal adenopathy  Plan: Bronchoscopy with endobronchial ultrasounds with transbronchial needle biopsy, brushings, forceps biopsy and washings under general anesthesia. Discussed risks and benefits of procedure including bleeding, infection and pneumothorax. Patient expressed understanding and agreed to proceed with procedure  Labs   CBC: Recent Labs  Lab 04/20/19 1052  WBC 12.1*  HGB 13.2  HCT 42.7  MCV 88.4  PLT 297    Basic Metabolic Panel: Recent Labs  Lab 04/20/19 1052  NA 140  K 3.9  CL 109  CO2 21*  GLUCOSE 96  BUN 23  CREATININE 1.68*  CALCIUM 10.1   GFR: Estimated Creatinine Clearance: 48.2 mL/min (A) (by C-G formula based on SCr of 1.68 mg/dL (H)). Recent Labs  Lab 04/20/19 1052  WBC 12.1*    Liver Function Tests: Recent Labs  Lab 04/20/19 1052  AST 16  ALT 12  ALKPHOS 59  BILITOT 0.8  PROT 7.8  ALBUMIN 4.2   No results for input(s): LIPASE, AMYLASE in the last 168 hours. No results for input(s): AMMONIA in the last 168 hours.  ABG    Component Value Date/Time   PHART 7.389 05/06/2013 0425   PCO2ART 41.4 05/06/2013 0425   PO2ART 110.0 (H) 05/06/2013 0425   HCO3 25.0 (H) 05/06/2013 0425   TCO2 26 05/06/2013 1701   ACIDBASEDEF 1.0 05/05/2013 1922   O2SAT 98.0 05/06/2013 0425     Coagulation Profile: Recent Labs  Lab 04/20/19 1052  INR 1.1    Cardiac Enzymes: No results  for input(s): CKTOTAL, CKMB, CKMBINDEX, TROPONINI in the last 168 hours.  HbA1C: Hgb A1c MFr Bld  Date/Time Value Ref Range Status  11/23/2018 03:50 PM 5.9 (H) 4.8 - 5.6 % Final    Comment:    (NOTE) Pre diabetes:          5.7%-6.4% Diabetes:              >6.4% Glycemic control for   <7.0% adults with diabetes     05/04/2013 10:40 PM 5.5 <5.7 % Final    Comment:    (NOTE)                                                                       According to the ADA Clinical Practice Recommendations for 2011, when HbA1c is used as a screening test:  >=6.5%   Diagnostic of Diabetes Mellitus           (if abnormal result is confirmed) 5.7-6.4%   Increased risk of developing Diabetes Mellitus References:Diagnosis and Classification of Diabetes Mellitus,Diabetes IPJA,2505,39(JQBHA 1):S62-S69 and Standards of Medical Care in         Diabetes - 2011,Diabetes LPFX,9024,09 (Suppl 1):S11-S61.    CBG: Recent Labs  Lab 04/13/19 1427 04/20/19 1056  GLUCAP 85 94    Review of Systems:   Review of Systems  Constitutional: Negative for chills, diaphoresis, fever, malaise/fatigue and weight loss.  HENT: Negative for congestion, ear pain and sore throat.   Respiratory: Negative for cough, hemoptysis, sputum production, shortness of breath and wheezing.   Cardiovascular: Negative for chest pain, palpitations and leg swelling.  Gastrointestinal: Negative for abdominal pain, heartburn and nausea.  Genitourinary: Negative for frequency.  Musculoskeletal: Negative for joint pain and myalgias.  Skin: Negative for itching and rash.  Neurological: Negative for dizziness, weakness and headaches.  Endo/Heme/Allergies: Does not bruise/bleed easily.  Psychiatric/Behavioral: Negative for depression. The patient is not nervous/anxious.    Past Medical History  He,  has a past medical history of AICD (automatic cardioverter/defibrillator) present, CAD (coronary artery disease), Cataracts, bilateral, Chronic systolic CHF (congestive heart failure) ([place]), COPD (chronic obstructive pulmonary disease) ([place]), Diabetes mellitus without complication ([place]), Emphysema, GERD (gastroesophageal reflux disease), Gout, Hyperlipidemia, Hypertension, Ischemic cardiomyopathy, PVD (peripheral vascular disease) ([place]), Tobacco abuse, and  Transaminitis.   Surgical History    Past Surgical History:  Procedure Laterality Date   APPENDECTOMY  as a child   CARDIAC CATHETERIZATION  04/08/2015   Procedure: Left Heart Cath and Cors/Grafts Angiography;  Surgeon: Peter M Martinique, MD;  Location: [facility];  Service: Cardiovascular;;   COLONOSCOPY     CORONARY ARTERY BYPASS GRAFT N/A 05/05/2013   Procedure: CORONARY ARTERY BYPASS GRAFTING (CABG) TIMES FIVE USING LEFT INTERNAL MAMMARY ARTERY AND RIGHT AND LEFT SAPHENOUS LEG VEIN HARVESTED ENDOSCOPICALLY;  Surgeon: Melrose Nakayama, MD;  Location: [facility];  Service: Open Heart Surgery;  Laterality: N/A;   IMPLANTABLE CARDIOVERTER DEFIBRILLATOR IMPLANT N/A 07/06/2014   Procedure: St Jude IMPLANTABLE CARDIOVERTER DEFIBRILLATOR IMPLANT;  Surgeon: Deboraha Sprang, MD;  Location: [facility]ATH LAB;  Service: Cardiovascular;  Laterality: N/A;   LEFT AND RIGHT HEART CATHETERIZATION WITH CORONARY ANGIOGRAM N/A 04/28/2013   Procedure: LEFT AND RIGHT HEART CATHETERIZATION WITH CORONARY ANGIOGRAM;  Surgeon: Annita Brod  Angelena Form, MD;  Location: [facility];  Service: Cardiovascular;  Laterality: N/A;   MULTIPLE EXTRACTIONS WITH ALVEOLOPLASTY N/A 05/03/2013   Procedure: Extraction of tooth #s 3,4,5,6,8,9,27 with alveoloplast and maxillary left osseous tuberosity reduction;  Surgeon: Lenn Cal, DDS;  Location: [facility];  Service: Oral Surgery;  Laterality: N/A;   TOTAL HIP ARTHROPLASTY Right 11/30/2018   Procedure: TOTAL HIP ARTHROPLASTY ANTERIOR APPROACH;  Surgeon: Rod Can, MD;  Location: [facility];  Service: Orthopedics;  Laterality: Right;     Social History   reports that he has been smoking cigarettes. He started smoking about 51 years ago. He has a 60.00 pack-year smoking history. He has never used smokeless tobacco. He reports previous alcohol use. He reports current drug use. Drug: Marijuana.   Family History   His family history includes Diabetes in his brother and mother;  Heart attack in his brother, brother and another family member; Hypertension in his father and another family member. There is no history of Stroke.   Allergies Allergies  Allergen Reactions   Penicillins Nausea And Vomiting, Swelling and Other (See Comments)    Per Dr Audria Nine Via phone 12/27/12 0400 SWELLING REACTION UNSPECIFIED      Home Medications  Prior to Admission medications   Medication Sig Start Date End Date Taking? Authorizing Provider  albuterol (VENTOLIN HFA) 108 (90 Base) MCG/ACT inhaler Inhale 1-2 puffs into the lungs every 6 (six) hours as needed for wheezing or shortness of breath. 04/05/19  Yes Margaretha Seeds, MD  allopurinol (ZYLOPRIM) 300 MG tablet Take 300 mg by mouth daily.   Yes [provider]  aspirin 81 MG chewable tablet Chew 1 tablet (81 mg total) by mouth 2 (two) times daily. 12/01/18  Yes Swinteck, Aaron Edelman, MD  atorvastatin (LIPITOR) 20 MG tablet Take 20 mg by mouth daily.   Yes [provider]  azelastine (OPTIVAR) 0.05 % ophthalmic solution Place 1 drop into the left eye 2 (two) times daily.  04/21/18  Yes [provider]  b complex vitamins tablet Take 1 tablet by mouth daily.   Yes [provider]  budesonide-formoterol (SYMBICORT) 160-4.5 MCG/ACT inhaler Inhale 2 puffs into the lungs 2 (two) times daily. 04/05/19  Yes Margaretha Seeds, MD  carvedilol (COREG) 6.25 MG tablet Take 6.25 mg by mouth 2 (two) times daily with a meal.  11/26/18  Yes [provider]  celecoxib (CELEBREX) 200 MG capsule Take 200 mg by mouth 2 (two) times daily as needed for mild pain.   Yes [provider]  docusate sodium (COLACE) 100 MG capsule Take 1 capsule (100 mg total) by mouth 2 (two) times daily. 12/01/18  Yes Swinteck, Aaron Edelman, MD  ENTRESTO 49-51 MG Take 1 tablet by mouth 2 (two) times daily. 02/20/19  Yes [provider]  furosemide (LASIX) 40 MG tablet Take 40 mg by mouth 2 (two) times daily.    Yes  [provider]  hydrALAZINE (APRESOLINE) 25 MG tablet TAKE 0.5 TABLETS (12.5 MG TOTAL) BY MOUTH 3 (THREE) TIMES DAILY. 04/18/19 07/17/19 Yes Clegg, Amy D, NP  HYDROcodone-acetaminophen (NORCO/VICODIN) 5-325 MG tablet Take 1 tablet by mouth every 4 (four) hours as needed. 03/30/19  Yes Isla Pence, MD  isosorbide mononitrate (IMDUR) 30 MG 24 hr tablet TAKE 1 TABLET BY MOUTH EVERY DAY 04/19/19  Yes Clegg, Amy D, NP  omeprazole (PRILOSEC) 40 MG capsule Take 40 mg by mouth daily.   Yes [provider]  ondansetron (ZOFRAN) 4 MG tablet Take  1 tablet (4 mg total) by mouth every 6 (six) hours as needed for nausea. 12/01/18  Yes Swinteck, Aaron Edelman, MD  Potassium Chloride ER 20 MEQ TBCR Take 20 mEq by mouth daily.  01/25/19  Yes [provider]  senna (SENOKOT) 8.6 MG TABS tablet Take 1 tablet (8.6 mg total) by mouth 2 (two) times daily. 12/01/18  Yes Swinteck, Aaron Edelman, MD  spironolactone (ALDACTONE) 25 MG tablet Take 0.5 tablets (12.5 mg total) by mouth daily. 03/21/19  Yes Larey Dresser, MD  tiotropium (SPIRIVA) 18 MCG inhalation capsule Place 18 mcg into inhaler and inhale daily.   Yes [provider]  tiZANidine (ZANAFLEX) 4 MG tablet Take 4 mg by mouth every 6 (six) hours as needed for muscle spasms.  01/25/19  Yes [provider]  colchicine (COLCRYS) 0.6 MG tablet Take 0.6 mg by mouth daily as needed (gout). At on set .012 mg gout and 0.6 mg an hour later. No more that three tablets a day    [provider]    Rodman Pickle, M.D. [facility] Pulmonary/Critical Care Medicine 04/20/2019 11:44 AM

## 2019-04-20 NOTE — Anesthesia Procedure Notes (Signed)
Procedure Name: Intubation Date/Time: 04/20/2019 1:37 PM Performed by: Trinna Post., CRNA Pre-anesthesia Checklist: Patient identified, Emergency Drugs available, Suction available, Patient being monitored and Timeout performed Patient Re-evaluated:Patient Re-evaluated prior to induction Oxygen Delivery Method: Circle system utilized Preoxygenation: Pre-oxygenation with 100% oxygen Induction Type: IV induction Ventilation: Mask ventilation without difficulty and Oral airway inserted - appropriate to patient size Laryngoscope Size: Mac and 4 Grade View: Grade I Tube type: Oral Tube size: 8.5 mm Number of attempts: 1 Airway Equipment and Method: Stylet Placement Confirmation: ETT inserted through vocal cords under direct vision,  positive ETCO2 and breath sounds checked- equal and bilateral Secured at: 23 cm Tube secured with: Tape Dental Injury: Teeth and Oropharynx as per pre-operative assessment

## 2019-04-20 NOTE — Transfer of Care (Signed)
Immediate Anesthesia Transfer of Care Note  Patient: [name] [name]  Procedure(s) Performed: VIDEO BRONCHOSCOPY WITH ENDOBRONCHIAL ULTRASOUND (N/A )  Patient Location: PACU  Anesthesia Type:General  Level of Consciousness: awake, alert  and oriented  Airway & Oxygen Therapy: Patient Spontanous Breathing and Patient connected to face mask oxygen  Post-op Assessment: Report given to RN and Post -op Vital signs reviewed and stable  Post vital signs: Reviewed and stable  Last Vitals:  Vitals Value Taken Time  BP 120/88 04/20/19 1519  Temp 36.9 C 04/20/19 1515  Pulse 91 04/20/19 1520  Resp 21 04/20/19 1520  SpO2 91 % 04/20/19 1520  Vitals shown include unvalidated device data.  Last Pain:  Vitals:   04/20/19 1515  TempSrc:   PainSc: 0-No pain      Patients Stated Pain Goal: 2 ([id number])  Complications: No apparent anesthesia complications

## 2019-04-20 NOTE — Op Note (Addendum)
Video Bronchoscopy with Endobronchial Ultrasound Procedure Note  Date of Operation: 04/20/2019  Pre-op Diagnosis: Left upper lobe mass and left hilar/mediastinal adenopathy  Post-op Diagnosis: Left upper lobe mass and left hilar/mediastinal adenopathy  Surgeon: Margaretha Seeds, MD  Assistants: None  Anesthesia: General endotracheal anesthesia  Operation: Flexible video fiberoptic bronchoscopy with endobronchial ultrasound and biopsies.  Estimated Blood Loss: Minimal  Complications: None  Indications and History: [name] [name] is a 68 y.o. male with left upper lobe mass and left hilar/mediastinal adenopathy.  The risks, benefits, complications, treatment options and expected outcomes were discussed with the patient.  The possibilities of pneumothorax, pneumonia, reaction to medication, pulmonary aspiration, perforation of a viscus, bleeding, failure to diagnose a condition and creating a complication requiring transfusion or operation were discussed with the patient who freely signed the consent.    Description of Procedure: The patient was examined in the preoperative area and history and data from the preprocedure consultation were reviewed. It was deemed appropriate to proceed.  The patient was taken to OR 10, identified as [name] [name] and the procedure verified as Flexible Video Fiberoptic Bronchoscopy.  A Time Out was held and the above information confirmed. After being taken to the operating room general anesthesia was initiated and the patient  was orally intubated.   The video fiberoptic bronchoscope was introduced via the endotracheal tube and a general inspection was performed. RUL, RML, RLL, LUL, LLL and their subsegments were visualized. Exam showed thick white secretions throughout the airways that were suctioned and cleared from airways. Bronchial washings were collected for culture.   The standard scope was then withdrawn and the endobronchial ultrasound was used to identify  and characterize the peritracheal, hilar and bronchial lymph nodes. Transbronchial needle sampling was performed as noted below. Visualized and sampled from 4R and station 7. 11R and 10R were visualized however not sampled due to <1mm. During procedure, pathology called OR and reported adequate tissue sampling from 4R. Additional sticks x 3 were taken from 4R for cell block. Bloody secretions were then suctioned and cleared from airway. No evidence of active bleeding.   Samples: 1. TBNA x 3 from 4R node (Slide 1-3, rinse x 6 for cell block) 2. TBNA x 3 from 7 node (Slide 3-6, rinse x 3 for cell block)   The patient tolerated the procedure well without apparent complications. There was no significant blood loss. The bronchoscope was withdrawn. Anesthesia was reversed and the patient was taken to the PACU for recovery.  Plans:  The patient will be discharged from the PACU to home when recovered from anesthesia. We will review the cytology, pathology and microbiology results with the patient when they become available. Outpatient followup will be with Pulmonology and Hem/Onc.   Rodman Pickle, M.D. [facility] Pulmonary/Critical Care Medicine Pager: [phone] After hours pager: [phone] 04/19/29 4:30 PM   4R   LLL    LUL and lingula   RLL with superior segment   RML   RUL   Mainstem   11R   10R

## 2019-04-21 ENCOUNTER — Encounter (HOSPITAL_COMMUNITY): Payer: Self-pay | Admitting: Pulmonary Disease

## 2019-04-21 ENCOUNTER — Other Ambulatory Visit: Payer: Self-pay | Admitting: Pulmonary Disease

## 2019-04-21 DIAGNOSIS — C3492 Malignant neoplasm of unspecified part of left bronchus or lung: Secondary | ICD-10-CM

## 2019-04-21 NOTE — Progress Notes (Signed)
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 Referral to Hem/Onc placed

## 2019-04-22 LAB — CULTURE, RESPIRATORY W GRAM STAIN: Culture: NORMAL

## 2019-04-22 LAB — ACID FAST SMEAR (AFB, MYCOBACTERIA): Acid Fast Smear: NEGATIVE

## 2019-04-24 ENCOUNTER — Telehealth: Payer: Self-pay | Admitting: *Deleted

## 2019-04-24 DIAGNOSIS — R918 Other nonspecific abnormal finding of lung field: Secondary | ICD-10-CM

## 2019-04-24 LAB — CYTOLOGY - NON PAP

## 2019-04-24 NOTE — Telephone Encounter (Signed)
Oncology Nurse Navigator Documentation  Oncology Nurse Navigator Flowsheets 04/24/2019  Navigator Location [facility]  Referral Date to RadOnc/MedOnc 04/24/2019  Navigator Encounter Type Telephone/I received referral on [name] [name] today.  I called to schedule him to be seen this week at [facility].  He verbalized understanding of appt time and place.   Telephone Outgoing Call  Treatment Phase Pre-Tx/Tx Discussion  Barriers/Navigation Needs Coordination of Care;Education  Education Other  Interventions Coordination of Care;Education  Acuity Level 2-Minimal Needs (1-2 Barriers Identified)  Coordination of Care Appts  Education Method Verbal  Time Spent with Patient 29

## 2019-04-27 ENCOUNTER — Inpatient Hospital Stay: Payer: Medicare HMO | Attending: Internal Medicine | Admitting: Internal Medicine

## 2019-04-27 ENCOUNTER — Inpatient Hospital Stay: Payer: Medicare HMO

## 2019-04-27 ENCOUNTER — Encounter: Payer: Self-pay | Admitting: Internal Medicine

## 2019-04-27 ENCOUNTER — Other Ambulatory Visit: Payer: Self-pay

## 2019-04-27 ENCOUNTER — Ambulatory Visit
Admission: RE | Admit: 2019-04-27 | Discharge: 2019-04-27 | Disposition: A | Discharge status: Home / Self Care | Payer: Medicare HMO | Source: Ambulatory Visit | Attending: Radiation Oncology | Admitting: Radiation Oncology

## 2019-04-27 DIAGNOSIS — E1136 Type 2 diabetes mellitus with diabetic cataract: Secondary | ICD-10-CM | POA: Insufficient documentation

## 2019-04-27 DIAGNOSIS — I2510 Atherosclerotic heart disease of native coronary artery without angina pectoris: Secondary | ICD-10-CM | POA: Diagnosis not present

## 2019-04-27 DIAGNOSIS — E785 Hyperlipidemia, unspecified: Secondary | ICD-10-CM | POA: Diagnosis not present

## 2019-04-27 DIAGNOSIS — Z791 Long term (current) use of non-steroidal anti-inflammatories (NSAID): Secondary | ICD-10-CM | POA: Diagnosis not present

## 2019-04-27 DIAGNOSIS — Z7982 Long term (current) use of aspirin: Secondary | ICD-10-CM | POA: Diagnosis not present

## 2019-04-27 DIAGNOSIS — I5022 Chronic systolic (congestive) heart failure: Secondary | ICD-10-CM | POA: Insufficient documentation

## 2019-04-27 DIAGNOSIS — Z5111 Encounter for antineoplastic chemotherapy: Secondary | ICD-10-CM | POA: Diagnosis not present

## 2019-04-27 DIAGNOSIS — F17210 Nicotine dependence, cigarettes, uncomplicated: Secondary | ICD-10-CM | POA: Diagnosis not present

## 2019-04-27 DIAGNOSIS — R091 Pleurisy: Secondary | ICD-10-CM | POA: Insufficient documentation

## 2019-04-27 DIAGNOSIS — R5383 Other fatigue: Secondary | ICD-10-CM | POA: Diagnosis not present

## 2019-04-27 DIAGNOSIS — Z7951 Long term (current) use of inhaled steroids: Secondary | ICD-10-CM | POA: Insufficient documentation

## 2019-04-27 DIAGNOSIS — Z95810 Presence of automatic (implantable) cardiac defibrillator: Secondary | ICD-10-CM | POA: Insufficient documentation

## 2019-04-27 DIAGNOSIS — Z7189 Other specified counseling: Secondary | ICD-10-CM

## 2019-04-27 DIAGNOSIS — Z951 Presence of aortocoronary bypass graft: Secondary | ICD-10-CM | POA: Diagnosis not present

## 2019-04-27 DIAGNOSIS — C3492 Malignant neoplasm of unspecified part of left bronchus or lung: Secondary | ICD-10-CM

## 2019-04-27 DIAGNOSIS — N289 Disorder of kidney and ureter, unspecified: Secondary | ICD-10-CM | POA: Diagnosis not present

## 2019-04-27 DIAGNOSIS — J449 Chronic obstructive pulmonary disease, unspecified: Secondary | ICD-10-CM | POA: Insufficient documentation

## 2019-04-27 DIAGNOSIS — C3412 Malignant neoplasm of upper lobe, left bronchus or lung: Secondary | ICD-10-CM | POA: Diagnosis not present

## 2019-04-27 DIAGNOSIS — R918 Other nonspecific abnormal finding of lung field: Secondary | ICD-10-CM

## 2019-04-27 DIAGNOSIS — Z79899 Other long term (current) drug therapy: Secondary | ICD-10-CM | POA: Insufficient documentation

## 2019-04-27 DIAGNOSIS — I110 Hypertensive heart disease with heart failure: Secondary | ICD-10-CM | POA: Diagnosis not present

## 2019-04-27 DIAGNOSIS — K219 Gastro-esophageal reflux disease without esophagitis: Secondary | ICD-10-CM | POA: Insufficient documentation

## 2019-04-27 LAB — CBC WITH DIFFERENTIAL (CANCER CENTER ONLY)
Abs Immature Granulocytes: 0.07 10*3/uL (ref 0.00–0.07)
Basophils Absolute: 0.1 10*3/uL (ref 0.0–0.1)
Basophils Relative: 1 %
Eosinophils Absolute: 0.5 10*3/uL (ref 0.0–0.5)
Eosinophils Relative: 5 %
HCT: 37.2 % — ABNORMAL LOW (ref 39.0–52.0)
Hemoglobin: 11.4 g/dL — ABNORMAL LOW (ref 13.0–17.0)
Immature Granulocytes: 1 %
Lymphocytes Relative: 19 %
Lymphs Abs: 2.1 10*3/uL (ref 0.7–4.0)
MCH: 27.2 pg (ref 26.0–34.0)
MCHC: 30.6 g/dL (ref 30.0–36.0)
MCV: 88.8 fL (ref 80.0–100.0)
Monocytes Absolute: 1 10*3/uL (ref 0.1–1.0)
Monocytes Relative: 9 %
Neutro Abs: 7.3 10*3/uL (ref 1.7–7.7)
Neutrophils Relative %: 65 %
Platelet Count: 234 10*3/uL (ref 150–400)
RBC: 4.19 MIL/uL — ABNORMAL LOW (ref 4.22–5.81)
RDW: 15.8 % — ABNORMAL HIGH (ref 11.5–15.5)
WBC Count: 11 10*3/uL — ABNORMAL HIGH (ref 4.0–10.5)
nRBC: 0 % (ref 0.0–0.2)

## 2019-04-27 LAB — CMP (CANCER CENTER ONLY)
ALT: 10 U/L (ref 0–44)
AST: 12 U/L — ABNORMAL LOW (ref 15–41)
Albumin: 3.7 g/dL (ref 3.5–5.0)
Alkaline Phosphatase: 60 U/L (ref 38–126)
Anion gap: 10 (ref 5–15)
BUN: 20 mg/dL (ref 8–23)
CO2: 23 mmol/L (ref 22–32)
Calcium: 9.7 mg/dL (ref 8.9–10.3)
Chloride: 108 mmol/L (ref 98–111)
Creatinine: 1.78 mg/dL — ABNORMAL HIGH (ref 0.61–1.24)
GFR, Est AFR Am: 45 mL/min — ABNORMAL LOW (ref >60)
GFR, Estimated: 39 mL/min — ABNORMAL LOW (ref >60)
Glucose, Bld: 81 mg/dL (ref 70–99)
Potassium: 4.2 mmol/L (ref 3.5–5.1)
Sodium: 141 mmol/L (ref 135–145)
Total Bilirubin: 0.4 mg/dL (ref 0.3–1.2)
Total Protein: 7.2 g/dL (ref 6.5–8.1)

## 2019-04-27 MED ORDER — PROCHLORPERAZINE MALEATE 10 MG PO TABS: 10.0000 mg | ORAL_TABLET | Freq: Four times a day (QID) | ORAL | 0 refills | Status: DC | PRN

## 2019-04-27 NOTE — Progress Notes (Signed)
START ON PATHWAY REGIMEN - Non-Small Cell Lung     Administer weekly:     Paclitaxel      Carboplatin   **Always confirm dose/schedule in your pharmacy ordering system**  Patient Characteristics: Stage III - Unresectable, PS = 0, 1 AJCC T Category: T2a Current Disease Status: No Distant Mets or Local Recurrence AJCC N Category: N3 AJCC M Category: M0 AJCC 8 Stage Grouping: IIIB ECOG Performance Status: 1 Intent of Therapy: Curative Intent, Discussed with Patient

## 2019-04-27 NOTE — Progress Notes (Signed)
[place] Telephone:([phone]) [phone]   [phone] Multidisciplinary thoracic oncology clinic CONSULT NOTE  REFERRING PHYSICIAN: Dr. Rodman Pickle  REASON FOR CONSULTATION:  68 years old white male recently diagnosed with lung cancer.  HPI [name] [name] is a 68 y.o. male with past medical history significant for multiple medical problems including history of hypertension, diabetes mellitus, COPD, congestive heart failure, coronary artery disease, status post AICD placement, dyslipidemia, peripheral vascular disease as well as long history for smoking.  The patient mentioned that he has been complaining of chest pain as well as shortness of breath for 1 week in early October 2020.  He was seen by his primary care physician and chest x-ray performed on March 30, 2019 showed stable focal density over the periphery of the left upper lobe thought to be suspicious for pleural-based nodule.  This was followed by CT scan of the chest without contrast on the same day and it showed very firm mass in the left upper lobe abutting the pleura and measuring 3.1 x 2.2 cm suspicious for malignancy.  This was followed by a PET scan on April 13, 2019 and it showed hypermetabolic 3.0 cm peripheral left upper lobe mass abutting the pleural surface consistent with primary bronchogenic carcinoma.  There was hypermetabolic left hilar and contralateral right paratracheal mediastinal lymph nodes consistent with metastatic disease.  There was no evidence of metastatic disease within the neck, abdomen or pelvis.  On April 20, 2019 the patient underwent video bronchoscopy with endobronchial ultrasound procedure under the care of Dr. Loanne Drilling.  The final pathology ([id number]) showed malignant cells consistent with non-small cell carcinoma.Immunohistochemistry showed tumor with P40, p63 and cytokeratin 5/6 and is negative with TTF-1. The immunophenotype is consistent with squamous cell carcinoma.  The  patient was referred to me today for evaluation and recommendation regarding treatment of his condition. When seen today he continues to have pain on the left side of the chest as well as back.  He continues to have cough productive of yellowish and clear sputum.  He has no shortness of breath.  He has no hemoptysis.  He denied having any nausea, vomiting, diarrhea or constipation.  He has no headache or visual changes.  Family history significant for mother with diabetes mellitus, father had hypertension. The patient is single and has 1 daughter who lives in [place].  He was accompanied by friend Collie Siad.  He used to work as a Horticulturist, commercial as well as Games developer.  He has a history of smoking 1 pack/day for around 50 years.  No history of alcohol or drug abuse.  HPI  Past Medical History:  Diagnosis Date   AICD (automatic cardioverter/defibrillator) present    reports no shocks  - st Jude   Ambulates with cane    CAD (coronary artery disease)    a. 04/2013 Cath: LM 10, LAD 40p, 35m, D1 50p, LCX 60p, 70m/100m, OM1 50, OM2 100 L-L collats, RCA 100p;  b. CABG x 5: LIMA->LAD, VG->D2, VG->RI->OM2, VG->PDA.   Cataracts, bilateral    Chronic systolic CHF (congestive heart failure) ([place])    a. 04/2013 Echo: EF 15-20%, diff HK, sev HK of inf and ant myocardium, dilated LA,  b. EF 20-25% with restrictive filling pattern, mod RV dysfx, mild MR (10/10/2013);  c.  Echo 12/15: EF 25-30%   COPD (chronic obstructive pulmonary disease) ([place])    Diabetes mellitus without complication ([place])    unsure what type but was dx as an adult, takes  no meds, check his cbg at home 2 times a week    Emphysema    GERD (gastroesophageal reflux disease)    Gout    Hyperlipidemia    Hypertension    Ischemic cardiomyopathy    PVD (peripheral vascular disease) ([place])    a. (10/2013) ABIs: RIGHT 0.63, Waveforms: monophasic;  LEFT 0.78, Waveforms: monophasic   Tobacco abuse    30+ pack-year history   Transaminitis      a. 04/2013 Abd U/S and CT unremarkable, hepatitis panel neg -->felt to be 2/2 acute R heart failure.    Past Surgical History:  Procedure Laterality Date   APPENDECTOMY  as a child   CARDIAC CATHETERIZATION  04/08/2015   Procedure: Left Heart Cath and Cors/Grafts Angiography;  Surgeon: Peter M Martinique, MD;  Location: [facility];  Service: Cardiovascular;;   COLONOSCOPY     CORONARY ARTERY BYPASS GRAFT N/A 05/05/2013   Procedure: CORONARY ARTERY BYPASS GRAFTING (CABG) TIMES FIVE USING LEFT INTERNAL MAMMARY ARTERY AND RIGHT AND LEFT SAPHENOUS LEG VEIN HARVESTED ENDOSCOPICALLY;  Surgeon: Melrose Nakayama, MD;  Location: [facility];  Service: Open Heart Surgery;  Laterality: N/A;   IMPLANTABLE CARDIOVERTER DEFIBRILLATOR IMPLANT N/A 07/06/2014   Procedure: St Jude IMPLANTABLE CARDIOVERTER DEFIBRILLATOR IMPLANT;  Surgeon: Deboraha Sprang, MD;  Location: [facility] C[facility];  Service: Cardiovascular;  Laterality: N/A;   LEFT AND RIGHT HEART CATHETERIZATION WITH CORONARY ANGIOGRAM N/A 04/28/2013   Procedure: LEFT AND RIGHT HEART CATHETERIZATION WITH CORONARY ANGIOGRAM;  Surgeon: Burnell Blanks, MD;  Location: [facility] CATH LAB;  Service: Cardiovascular;  Laterality: N/A;   MULTIPLE EXTRACTIONS WITH ALVEOLOPLASTY N/A 05/03/2013   Procedure: Extraction of tooth #s 3,4,5,6,8,9,27 with alveoloplast and maxillary left osseous tuberosity reduction;  Surgeon: Lenn Cal, DDS;  Location: [place];  Service: Oral Surgery;  Laterality: N/A;   TOTAL HIP ARTHROPLASTY Right 11/30/2018   Procedure: TOTAL HIP ARTHROPLASTY ANTERIOR APPROACH;  Surgeon: Rod Can, MD;  Location: [facility];  Service: Orthopedics;  Laterality: Right;   VIDEO BRONCHOSCOPY WITH ENDOBRONCHIAL ULTRASOUND N/A 04/20/2019   Procedure: VIDEO BRONCHOSCOPY WITH ENDOBRONCHIAL ULTRASOUND;  Surgeon: Margaretha Seeds, MD;  Location: [place];  Service: Thoracic;  Laterality: N/A;    Family History  Problem Relation Age of Onset    Diabetes Mother    Hypertension Father    Hypertension Other    Heart attack Other        Brothers   Diabetes Brother    Heart attack Brother    Heart attack Brother    Stroke Neg Hx     Social History Social History   Tobacco Use   Smoking status: Current Every Day Smoker    Packs/day: 2.00    Years: 30.00    Pack years: 60.00    Types: Cigarettes    Start date: 1969   Smokeless tobacco: Never Used   Tobacco comment: Pt states now smokes - half pack per day 04/05/19  Substance Use Topics   Alcohol use: Not Currently   Drug use: Yes    Types: Marijuana    Comment: last use 04/17/19    Allergies  Allergen Reactions   Penicillins Nausea And Vomiting, Swelling and Other (See Comments)    Per Dr Audria Nine Via phone 12/27/12 0400 SWELLING REACTION UNSPECIFIED     Current Outpatient Medications  Medication Sig Dispense Refill   albuterol (VENTOLIN HFA) 108 (90 Base) MCG/ACT inhaler Inhale 1-2 puffs into the lungs every 6 (six) hours as needed  for wheezing or shortness of breath. 6.7 g 6   allopurinol (ZYLOPRIM) 300 MG tablet Take 300 mg by mouth daily.     aspirin 81 MG chewable tablet Chew 1 tablet (81 mg total) by mouth 2 (two) times daily. 60 tablet 1   atorvastatin (LIPITOR) 20 MG tablet Take 20 mg by mouth daily.     azelastine (OPTIVAR) 0.05 % ophthalmic solution Place 1 drop into the left eye 2 (two) times daily.   0   b complex vitamins tablet Take 1 tablet by mouth daily.     budesonide-formoterol (SYMBICORT) 160-4.5 MCG/ACT inhaler Inhale 2 puffs into the lungs 2 (two) times daily. 1 Inhaler 6   carvedilol (COREG) 6.25 MG tablet Take 6.25 mg by mouth 2 (two) times daily with a meal.      celecoxib (CELEBREX) 200 MG capsule Take 200 mg by mouth 2 (two) times daily as needed for mild pain.     colchicine (COLCRYS) 0.6 MG tablet Take 0.6 mg by mouth daily as needed (gout). At on set .012 mg gout and 0.6 mg an hour later. No more that three  tablets a day     docusate sodium (COLACE) 100 MG capsule Take 1 capsule (100 mg total) by mouth 2 (two) times daily. 60 capsule 1   ENTRESTO 49-51 MG Take 1 tablet by mouth 2 (two) times daily.     furosemide (LASIX) 40 MG tablet Take 40 mg by mouth 2 (two) times daily.      hydrALAZINE (APRESOLINE) 25 MG tablet TAKE 0.5 TABLETS (12.5 MG TOTAL) BY MOUTH 3 (THREE) TIMES DAILY. 45 tablet 3   HYDROcodone-acetaminophen (NORCO/VICODIN) 5-325 MG tablet Take 1 tablet by mouth every 4 (four) hours as needed. 10 tablet 0   isosorbide mononitrate (IMDUR) 30 MG 24 hr tablet TAKE 1 TABLET BY MOUTH EVERY DAY 30 tablet 3   omeprazole (PRILOSEC) 40 MG capsule Take 40 mg by mouth daily.     ondansetron (ZOFRAN) 4 MG tablet Take 1 tablet (4 mg total) by mouth every 6 (six) hours as needed for nausea. 20 tablet 0   Potassium Chloride ER 20 MEQ TBCR Take 20 mEq by mouth daily.      senna (SENOKOT) 8.6 MG TABS tablet Take 1 tablet (8.6 mg total) by mouth 2 (two) times daily. 120 each 0   spironolactone (ALDACTONE) 25 MG tablet Take 0.5 tablets (12.5 mg total) by mouth daily. 45 tablet 1   tiotropium (SPIRIVA) 18 MCG inhalation capsule Place 18 mcg into inhaler and inhale daily.     tiZANidine (ZANAFLEX) 4 MG tablet Take 4 mg by mouth every 6 (six) hours as needed for muscle spasms.      No current facility-administered medications for this visit.     Review of Systems  Constitutional: positive for fatigue Eyes: negative Ears, nose, mouth, throat, and face: negative Respiratory: positive for cough and pleurisy/chest pain Cardiovascular: negative Gastrointestinal: negative Genitourinary:negative Integument/breast: negative Hematologic/lymphatic: negative Musculoskeletal:negative Neurological: negative Behavioral/Psych: negative Endocrine: negative Allergic/Immunologic: negative  Physical Exam  ONG:EXBMW, healthy, no distress, well nourished, well developed and anxious SKIN: skin color,  texture, turgor are normal, no rashes or significant lesions HEAD: Normocephalic, No masses, lesions, tenderness or abnormalities EYES: normal, PERRLA, Conjunctiva are pink and non-injected EARS: External ears normal, Canals clear OROPHARYNX:no exudate, no erythema and lips, buccal mucosa, and tongue normal  NECK: supple, no adenopathy, no JVD LYMPH:  no palpable lymphadenopathy, no hepatosplenomegaly LUNGS: clear to auscultation , and palpation HEART:  regular rate & rhythm, no murmurs and no gallops ABDOMEN:abdomen soft, non-tender, normal bowel sounds and no masses or organomegaly BACK: No CVA tenderness, Range of motion is normal EXTREMITIES:no joint deformities, effusion, or inflammation, no edema  NEURO: alert & oriented x 3 with fluent speech, no focal motor/sensory deficits  PERFORMANCE STATUS: ECOG 1  LABORATORY DATA: Lab Results  Component Value Date   WBC 11.0 (H) 04/27/2019   HGB 11.4 (L) 04/27/2019   HCT 37.2 (L) 04/27/2019   MCV 88.8 04/27/2019   PLT 234 04/27/2019      Chemistry      Component Value Date/Time   NA 140 04/20/2019 1052   NA 141 08/31/2017 1433   K 3.9 04/20/2019 1052   CL 109 04/20/2019 1052   CO2 21 (L) 04/20/2019 1052   BUN 23 04/20/2019 1052   BUN 33 (H) 08/31/2017 1433   CREATININE 1.68 (H) 04/20/2019 1052   CREATININE 1.35 (H) 12/25/2015 1310      Component Value Date/Time   CALCIUM 10.1 04/20/2019 1052   ALKPHOS 59 04/20/2019 1052   AST 16 04/20/2019 1052   ALT 12 04/20/2019 1052   BILITOT 0.8 04/20/2019 1052       RADIOGRAPHIC STUDIES: Dg Chest 2 View  Result Date: 03/30/2019 CLINICAL DATA:  Chest pain and shortness of breath 1 week. EXAM: CHEST - 2 VIEW COMPARISON:  03/13/2019 and chest CT 03/07/2019 FINDINGS: Median sternotomy wires and left-sided pacemaker unchanged. Lungs are adequately inflated without acute airspace process or effusion. Focal density peripheral density over the left upper lobe just above the pacemaker  unchanged and thought to be suspicious pleural based nodule by CT. Cardiomediastinal silhouette and remainder of the exam is unchanged. IMPRESSION: No acute cardiopulmonary disease. Stable focal density over the periphery of the left upper lobe thought to be a suspicious pleural-based nodule on recent CT. Electronically Signed   By: Marin Olp M.D.   On: 03/30/2019 13:37   Ct Chest Wo Contrast  Result Date: 03/30/2019 CLINICAL DATA:  Acute respiratory illness. Chest pain and shortness of breath. EXAM: CT CHEST WITHOUT CONTRAST TECHNIQUE: Multidetector CT imaging of the chest was performed following the standard protocol without IV contrast. COMPARISON:  CT chest 03/07/2019 FINDINGS: Cardiovascular: AICD noted. Prior CABG. Coronary, aortic arch, and branch vessel atherosclerotic vascular disease. Mild cardiomegaly. Mediastinum/Nodes: No mediastinal hematoma or adenopathy identified. Mildly dilated gas-filled esophagus. Lungs/Pleura: Peripheral mass in the left upper lobe abutting the pleura, approximately 3.1 by 2.2 cm on image 52/7, as shown on the 03/07/2019 exam. This lesion is concerning for malignancy. Adjacent pleural thickening noted. Calcified pleural based nodule anteriorly along the right upper lobe, image 59/7, stable. Upper Abdomen: Hypodense right renal lesion, nonspecific. Abdominal aortic atherosclerosis. Musculoskeletal: Prominence of epidural adipose tissues in the thoracic spine. IMPRESSION: 1. Peripheral mass in the left upper lobe abutting the pleura, 3.1 by 2.2 cm in long axis, suspicious for malignancy. 2. Other imaging findings of potential clinical significance: Coronary atherosclerosis. Mild cardiomegaly. Prior CABG. Prominence of epidural adipose tissues in the thoracic spine. Hypodense right renal lesion, nonspecific but statistically likely to be a cyst. 3. Mildly dilated and gas-filled esophagus. Aortic Atherosclerosis (ICD10-I70.0). Electronically Signed   By: Van Clines  M.D.   On: 03/30/2019 20:37   Nm Pet Image Initial (pi) Skull Base To Thigh  Result Date: 04/13/2019 CLINICAL DATA:  Initial treatment strategy for left upper lobe lung mass. EXAM: NUCLEAR MEDICINE PET SKULL BASE TO THIGH TECHNIQUE: 11.1 mCi F-18 FDG was  injected intravenously. Full-ring PET imaging was performed from the skull base to thigh after the radiotracer. CT data was obtained and used for attenuation correction and anatomic localization. Fasting blood glucose: 87 mg/dl COMPARISON:  03/30/2019 FINDINGS: Mediastinal blood-pool activity (background): SUV max = 3.2 Liver activity (reference): SUV max = N/A NECK:  No hypermetabolic lymph nodes or masses. Incidental CT findings:  None. CHEST: A 3 cm mass is seen in the lateral left upper lobe which abuts the lateral chest wall. This is hypermetabolic, with SUV max of 27.9, consistent with primary bronchogenic carcinoma. Hypermetabolic activity is seen involving small left hilar lymph nodes, with SUV max of 14.4. 11 mm right paratracheal mediastinal lymph node is also seen which is hypermetabolic with SUV max of 29.4. No other suspicious pulmonary nodules or masses identified on CT images. No evidence of pleural effusion. Incidental CT findings: Aortic and coronary artery atherosclerosis. Prior CABG noted. ABDOMEN/PELVIS: No abnormal hypermetabolic activity within the liver, pancreas, adrenal glands, or spleen. No hypermetabolic lymph nodes in the abdomen or pelvis. Incidental CT findings: A few small fluid attenuation right renal cysts are again seen. Diverticulosis is seen mainly involving the sigmoid colon, however there is no evidence of diverticulitis. Aortic atherosclerosis. SKELETON: No focal hypermetabolic bone lesions to suggest skeletal metastasis. Right hip prosthesis noted. Incidental CT findings:  None. IMPRESSION: Hypermetabolic 3 cm peripheral left upper lobe mass abutting the pleural surface, consistent with primary bronchogenic carcinoma.  Hypermetabolic left hilar and contralateral right paratracheal mediastinal lymph nodes, consistent with metastatic disease. No evidence of metastatic disease within the neck, abdomen, or pelvis. Electronically Signed   By: Marlaine Hind M.D.   On: 04/13/2019 16:27   Dg Chest Port 1 View  Result Date: 04/20/2019 CLINICAL DATA:  Postoperative, bronchoscopy EXAM: PORTABLE CHEST 1 VIEW COMPARISON:  Chest radiograph, 04/20/2019, 11:20 a.m., CT chest, 03/30/2019 FINDINGS: Cardiomegaly status post median sternotomy with left chest single lead pacer defibrillator. A subpleural mass of the left upper lobe is primarily obscured by pacer control box. No acute abnormality of the lungs. The visualized skeletal structures are unremarkable. IMPRESSION: No acute abnormality of the lungs status post bronchoscopy. A subpleural mass of the left upper lobe is primarily obscured by pacer control box. Electronically Signed   By: Eddie Candle M.D.   On: 04/20/2019 15:54   Dg Chest Port 1 View  Result Date: 04/20/2019 CLINICAL DATA:  Preop for left lung biopsy. EXAM: PORTABLE CHEST 1 VIEW COMPARISON:  March 30, 2019. FINDINGS: The heart size and mediastinal contours are within normal limits. Status post coronary bypass graft. Right lung is clear. Pleural based mass noted laterally in left upper lobe on prior exam is not visualized currently due to overlying pacemaker. The visualized skeletal structures are unremarkable. IMPRESSION: Left upper lobe pleural-based mass noted on prior exam is not visualized currently due to overlying pacemaker. Electronically Signed   By: Marijo Conception M.D.   On: 04/20/2019 11:39    ASSESSMENT: This is a very pleasant 68 years old white male recently diagnosed with a stage IIIc (T2 a, N3, M0) non-small cell lung cancer, squamous cell carcinoma diagnosed in October 2020 and presented with left upper lobe pleural-based mass in addition to left hilar and right paratracheal  lymphadenopathy.   PLAN: I had a lengthy discussion with the patient and his friend today about his current disease stage, prognosis and treatment options. I personally and independently reviewed the scan images and discussed the result and showed the images to the  patient today. I recommended for the patient a course of concurrent chemoradiation with weekly carboplatin for AUC of 2 and paclitaxel 45 mg/M2.  After completion of this course of concurrent chemoradiation I would consider the patient for consolidation treatment with immunotherapy with Imfinzi if he has no evidence for disease progression. I discussed with the patient the adverse effect of this treatment including but not limited to alopecia, myelosuppression, nausea and vomiting, peripheral neuropathy, liver or renal dysfunction. The patient will see radiation oncology later today for evaluation and discussion of the radiotherapy option. He would like to proceed with the treatment as planned and expected to start the first cycle of this treatment on May 08, 2019. The patient had AICD and he will not be able to have MRI of the brain for staging of his disease in the brain.  I consider him for CT scan of the head with and without contrast but his serum creatinine is elevated.  I will monitor him for now for any symptoms. He will come back for follow-up visit 1 week after the start of his treatment for evaluation and management of any adverse effect of his treatment. I will arrange for the patient to have a chemotherapy education class before the first dose of his treatment. I will send a prescription for Compazine 10 mg p.o. every 6 hours as needed for nausea to his pharmacy. The patient was advised to call immediately if he has any concerning symptoms in the interval. The patient voices understanding of current disease status and treatment options and is in agreement with the current care plan. All questions were answered. The patient  knows to call the clinic with any problems, questions or concerns. We can certainly see the patient much sooner if necessary.  Thank you so much for allowing me to participate in the care of [name] [name]. I will continue to follow up the patient with you and assist in his care.  I spent 55 minutes counseling the patient face to face. The total time spent in the appointment was 80 minutes.  Disclaimer: This note was dictated with voice recognition software. Similar sounding words can inadvertently be transcribed and may not be corrected upon review.   Eilleen Kempf April 27, 2019, 2:49 PM

## 2019-04-28 ENCOUNTER — Other Ambulatory Visit: Payer: Self-pay | Admitting: *Deleted

## 2019-04-28 ENCOUNTER — Encounter: Payer: Self-pay | Admitting: *Deleted

## 2019-04-28 NOTE — Progress Notes (Signed)
Patient's treatment plan was discussed at cancer conference 04/27/2019 is for discussion purpose only and is not a binding recommendation.  The patient was not physically examined nor present for their treatment options.  Therefore, final treatment plan cannot be decided.

## 2019-04-28 NOTE — Progress Notes (Signed)
Oncology Nurse Navigator Documentation  Oncology Nurse Navigator Flowsheets 04/28/2019  Navigator Location [facility]  Referral Date to RadOnc/MedOnc -  Navigator Encounter Type Other/I followed up on [name] appt with [name] [name] from yesterday at [facility].  Plan of care is concurrent chemo rad.  His schedule is not set up yet but will follow up on this.   Telephone -  Treatment Phase Pre-Tx/Tx Discussion  Barriers/Navigation Needs Coordination of Care  Education -  Interventions Coordination of Care  Acuity Level 2-Minimal Needs (1-2 Barriers Identified)  Coordination of Care Other  Education Method -  Time Spent with Patient 30

## 2019-04-30 ENCOUNTER — Telehealth: Payer: Self-pay | Admitting: Radiation Oncology

## 2019-04-30 NOTE — Telephone Encounter (Signed)
The patient's sister Delphine Lanae Boast called with questions about his cancer. We discussed recommendations of treatment.

## 2019-04-30 NOTE — Progress Notes (Addendum)
Radiation Oncology         [phone] ________________________________  Name: [name] [name]        MRN: [id number]  Date of Service: 04/27/2019 DOB: [birth year]  FA:[id number], Margaretmary Lombard, MD     REFERRING PHYSICIAN: Margaretha Seeds, MD   DIAGNOSIS: The encounter diagnosis was Stage III squamous cell carcinoma of left lung (Onset).   HISTORY OF PRESENT ILLNESS: [name] [name] is a 68 y.o. male seen at the request of Dr. Loanne Drilling for a new diagnosis of lung cancer.  The patient had noticed some symptoms of chest pain and proceeded with a chest x-ray on 04/09/2019, which revealed a focal density over the left upper lobe.  He underwent a CT chest without contrast the same day reviewing a peripheral mass in the left upper lobe abutting the pleura measuring 3.1 x 2.2 cm suspicious for malignancy.  He subsequently underwent pet imaging after being referred to pulmonary, and on 04/13/2019 this revealed the left upper lobe mass measuring 3 cm with an SUV of 57.8, hypermetabolic activity was seen in the left hilar lymph nodes with an SUV of 14.4, an 11 mm right paratracheal mediastinal node was also seen with an SUV of 18.  No other evidence of metastatic disease was noted.  He was counseled on bronchoscopy and this was performed on 04/20/2019.  Level 7 and level 4R lymph node were both sampled, the level 4R node revealed a non-small cell carcinoma consistent with squamous cell features.  He has an ICD in place and a complicated cardiopulmonary history.  Given this he has not been able to undergo an MRI of the brain, and his creatinine does not allow for him to undergo a CT with contrast.  He is seen today in multidisciplinary thoracic oncology clinic.     PREVIOUS RADIATION THERAPY: No   PAST MEDICAL HISTORY:  Past Medical History:  Diagnosis Date  . AICD (automatic cardioverter/defibrillator) present    reports no shocks  - st Jude  . Ambulates with cane   . CAD (coronary artery  disease)    a. 04/2013 Cath: LM 10, LAD 40p, 71m, D1 50p, LCX 60p, 94m/100m, OM1 50, OM2 100 L-L collats, RCA 100p;  b. CABG x 5: LIMA->LAD, VG->D2, VG->RI->OM2, VG->PDA.  . Cataracts, bilateral   . Chronic systolic CHF (congestive heart failure) ([place])    a. 04/2013 Echo: EF 15-20%, diff HK, sev HK of inf and ant myocardium, dilated LA,  b. EF 20-25% with restrictive filling pattern, mod RV dysfx, mild MR (10/10/2013);  c.  Echo 12/15: EF 25-30%  . COPD (chronic obstructive pulmonary disease) ([place])   . Diabetes mellitus without complication ([place])    unsure what type but was dx as an adult, takes no meds, check his cbg at home 2 times a week   . Emphysema   . GERD (gastroesophageal reflux disease)   . Gout   . Hyperlipidemia   . Hypertension   . Ischemic cardiomyopathy   . PVD (peripheral vascular disease) ([place])    a. (10/2013) ABIs: RIGHT 0.63, Waveforms: monophasic;  LEFT 0.78, Waveforms: monophasic  . Tobacco abuse    30+ pack-year history  . Transaminitis    a. 04/2013 Abd U/S and CT unremarkable, hepatitis panel neg -->felt to be 2/2 acute R heart failure.       PAST SURGICAL HISTORY: Past Surgical History:  Procedure Laterality Date  . APPENDECTOMY  as a child  . CARDIAC CATHETERIZATION  04/08/2015  Procedure: Left Heart Cath and Cors/Grafts Angiography;  Surgeon: Peter M Martinique, MD;  Location: [facility];  Service: Cardiovascular;;  . COLONOSCOPY    . CORONARY ARTERY BYPASS GRAFT N/A 05/05/2013   Procedure: CORONARY ARTERY BYPASS GRAFTING (CABG) TIMES FIVE USING LEFT INTERNAL MAMMARY ARTERY AND RIGHT AND LEFT SAPHENOUS LEG VEIN HARVESTED ENDOSCOPICALLY;  Surgeon: Melrose Nakayama, MD;  Location: [facility];  Service: Open Heart Surgery;  Laterality: N/A;  . IMPLANTABLE CARDIOVERTER DEFIBRILLATOR IMPLANT N/A 07/06/2014   Procedure: St Jude IMPLANTABLE CARDIOVERTER DEFIBRILLATOR IMPLANT;  Surgeon: Deboraha Sprang, MD;  Location: [facility];  Service: Cardiovascular;   Laterality: N/A;  . LEFT AND RIGHT HEART CATHETERIZATION WITH CORONARY ANGIOGRAM N/A 04/28/2013   Procedure: LEFT AND RIGHT HEART CATHETERIZATION WITH CORONARY ANGIOGRAM;  Surgeon: Burnell Blanks, MD;  Location: [facility] C[facility];  Service: Cardiovascular;  Laterality: N/A;  . MULTIPLE EXTRACTIONS WITH ALVEOLOPLASTY N/A 05/03/2013   Procedure: Extraction of tooth #s 3,4,5,6,8,9,27 with alveoloplast and maxillary left osseous tuberosity reduction;  Surgeon: Lenn Cal, DDS;  Location: [facility];  Service: Oral Surgery;  Laterality: N/A;  . TOTAL HIP ARTHROPLASTY Right 11/30/2018   Procedure: TOTAL HIP ARTHROPLASTY ANTERIOR APPROACH;  Surgeon: Rod Can, MD;  Location: [facility];  Service: Orthopedics;  Laterality: Right;  Marland Kitchen VIDEO BRONCHOSCOPY WITH ENDOBRONCHIAL ULTRASOUND N/A 04/20/2019   Procedure: VIDEO BRONCHOSCOPY WITH ENDOBRONCHIAL ULTRASOUND;  Surgeon: Margaretha Seeds, MD;  Location: [facility];  Service: Thoracic;  Laterality: N/A;     FAMILY HISTORY:  Family History  Problem Relation Age of Onset  . Diabetes Mother   . Hypertension Father   . Hypertension Other   . Heart attack Other        Brothers  . Diabetes Brother   . Heart attack Brother   . Heart attack Brother   . Stroke Neg Hx      SOCIAL HISTORY:  reports that he has been smoking cigarettes. He started smoking about 51 years ago. He has a 60.00 pack-year smoking history. He has never used smokeless tobacco. He reports previous alcohol use. He reports current drug use. Drug: Marijuana.  The patient is single and lives in [place].  He is accompanied by a friend.  He enjoys hunting and fishing.   ALLERGIES: Penicillins   MEDICATIONS:  Current Outpatient Medications  Medication Sig Dispense Refill  . albuterol (VENTOLIN HFA) 108 (90 Base) MCG/ACT inhaler Inhale 1-2 puffs into the lungs every 6 (six) hours as needed for wheezing or shortness of breath. 6.7 g 6  . allopurinol (ZYLOPRIM) 300 MG tablet  Take 300 mg by mouth daily.    Marland Kitchen aspirin 81 MG chewable tablet Chew 1 tablet (81 mg total) by mouth 2 (two) times daily. 60 tablet 1  . atorvastatin (LIPITOR) 20 MG tablet Take 20 mg by mouth daily.    Marland Kitchen azelastine (OPTIVAR) 0.05 % ophthalmic solution Place 1 drop into the left eye 2 (two) times daily.   0  . b complex vitamins tablet Take 1 tablet by mouth daily.    . budesonide-formoterol (SYMBICORT) 160-4.5 MCG/ACT inhaler Inhale 2 puffs into the lungs 2 (two) times daily. 1 Inhaler 6  . carvedilol (COREG) 6.25 MG tablet Take 6.25 mg by mouth 2 (two) times daily with a meal.     . celecoxib (CELEBREX) 200 MG capsule Take 200 mg by mouth 2 (two) times daily as needed for mild pain.    Marland Kitchen colchicine (COLCRYS) 0.6 MG tablet  Take 0.6 mg by mouth daily as needed (gout). At on set .012 mg gout and 0.6 mg an hour later. No more that three tablets a day    . docusate sodium (COLACE) 100 MG capsule Take 1 capsule (100 mg total) by mouth 2 (two) times daily. 60 capsule 1  . ENTRESTO 49-51 MG Take 1 tablet by mouth 2 (two) times daily.    . furosemide (LASIX) 40 MG tablet Take 40 mg by mouth 2 (two) times daily.     . hydrALAZINE (APRESOLINE) 25 MG tablet TAKE 0.5 TABLETS (12.5 MG TOTAL) BY MOUTH 3 (THREE) TIMES DAILY. 45 tablet 3  . HYDROcodone-acetaminophen (NORCO/VICODIN) 5-325 MG tablet Take 1 tablet by mouth every 4 (four) hours as needed. 10 tablet 0  . isosorbide mononitrate (IMDUR) 30 MG 24 hr tablet TAKE 1 TABLET BY MOUTH EVERY DAY 30 tablet 3  . omeprazole (PRILOSEC) 40 MG capsule Take 40 mg by mouth daily.    . ondansetron (ZOFRAN) 4 MG tablet Take 1 tablet (4 mg total) by mouth every 6 (six) hours as needed for nausea. 20 tablet 0  . Potassium Chloride ER 20 MEQ TBCR Take 20 mEq by mouth daily.     . prochlorperazine (COMPAZINE) 10 MG tablet Take 1 tablet (10 mg total) by mouth every 6 (six) hours as needed for nausea or vomiting. 30 tablet 0  . senna (SENOKOT) 8.6 MG TABS tablet Take 1 tablet  (8.6 mg total) by mouth 2 (two) times daily. 120 each 0  . spironolactone (ALDACTONE) 25 MG tablet Take 0.5 tablets (12.5 mg total) by mouth daily. 45 tablet 1  . tiotropium (SPIRIVA) 18 MCG inhalation capsule Place 18 mcg into inhaler and inhale daily.    Marland Kitchen tiZANidine (ZANAFLEX) 4 MG tablet Take 4 mg by mouth every 6 (six) hours as needed for muscle spasms.      No current facility-administered medications for this encounter.      REVIEW OF SYSTEMS: On review of systems, the patient reports that he is doing well overall. He denies any chest pain, he denies any shortness of breath at rest though he does have intermittent sometimes productive cough without fevers, chills, night sweats, unintended weight changes.  He denies any bowel or bladder disturbances, and denies abdominal pain, nausea or vomiting.  He denies any new musculoskeletal or joint aches or pains. A complete review of systems is obtained and is otherwise negative.     PHYSICAL EXAM:  Wt Readings from Last 3 Encounters:  04/27/19 217 lb 14.4 oz (98.8 kg)  04/20/19 218 lb 0.6 oz (98.9 kg)  04/05/19 218 lb 3.2 oz (99 kg)   Temp Readings from Last 3 Encounters:  04/27/19 98.7 F (37.1 C) (Temporal)  04/20/19 98.4 F (36.9 C)  04/05/19 (!) 97.2 F (36.2 C) (Temporal)   BP Readings from Last 3 Encounters:  04/27/19 117/64  04/20/19 (!) 154/88  04/05/19 116/60   Pulse Readings from Last 3 Encounters:  04/27/19 93  04/20/19 88  04/05/19 75      In general this is a well appearing African-American male in no acute distress.  He's alert and oriented x4 and appropriate throughout the examination. Cardiopulmonary assessment is negative for acute distress and he exhibits normal effort.     ECOG = 1  0 - Asymptomatic (Fully active, able to carry on all predisease activities without restriction)  1 - Symptomatic but completely ambulatory (Restricted in physically strenuous activity but ambulatory and able to carry  out  work of a light or sedentary nature. For example, light housework, office work)  2 - Symptomatic, <50% in bed during the day (Ambulatory and capable of all self care but unable to carry out any work activities. Up and about more than 50% of waking hours)  3 - Symptomatic, >50% in bed, but not bedbound (Capable of only limited self-care, confined to bed or chair 50% or more of waking hours)  4 - Bedbound (Completely disabled. Cannot carry on any self-care. Totally confined to bed or chair)  5 - Death   Eustace Pen MM, Creech RH, Tormey DC, et al. [phone]). "Toxicity and response criteria of the [facility] Group". [place] Oncol. 5 (6): 649-55    LABORATORY DATA:  Lab Results  Component Value Date   WBC 11.0 (H) 04/27/2019   HGB 11.4 (L) 04/27/2019   HCT 37.2 (L) 04/27/2019   MCV 88.8 04/27/2019   PLT 234 04/27/2019   Lab Results  Component Value Date   NA 141 04/27/2019   K 4.2 04/27/2019   CL 108 04/27/2019   CO2 23 04/27/2019   Lab Results  Component Value Date   ALT 10 04/27/2019   AST 12 (L) 04/27/2019   ALKPHOS 60 04/27/2019   BILITOT 0.4 04/27/2019      RADIOGRAPHY: Nm Pet Image Initial (pi) Skull Base To Thigh  Result Date: 04/13/2019 CLINICAL DATA:  Initial treatment strategy for left upper lobe lung mass. EXAM: NUCLEAR MEDICINE PET SKULL BASE TO THIGH TECHNIQUE: 11.1 mCi F-18 FDG was injected intravenously. Full-ring PET imaging was performed from the skull base to thigh after the radiotracer. CT data was obtained and used for attenuation correction and anatomic localization. Fasting blood glucose: 87 mg/dl COMPARISON:  03/30/2019 FINDINGS: Mediastinal blood-pool activity (background): SUV max = 3.2 Liver activity (reference): SUV max = N/A NECK:  No hypermetabolic lymph nodes or masses. Incidental CT findings:  None. CHEST: A 3 cm mass is seen in the lateral left upper lobe which abuts the lateral chest wall. This is hypermetabolic, with SUV max of  27.9, consistent with primary bronchogenic carcinoma. Hypermetabolic activity is seen involving small left hilar lymph nodes, with SUV max of 14.4. 11 mm right paratracheal mediastinal lymph node is also seen which is hypermetabolic with SUV max of 76.1. No other suspicious pulmonary nodules or masses identified on CT images. No evidence of pleural effusion. Incidental CT findings: Aortic and coronary artery atherosclerosis. Prior CABG noted. ABDOMEN/PELVIS: No abnormal hypermetabolic activity within the liver, pancreas, adrenal glands, or spleen. No hypermetabolic lymph nodes in the abdomen or pelvis. Incidental CT findings: A few small fluid attenuation right renal cysts are again seen. Diverticulosis is seen mainly involving the sigmoid colon, however there is no evidence of diverticulitis. Aortic atherosclerosis. SKELETON: No focal hypermetabolic bone lesions to suggest skeletal metastasis. Right hip prosthesis noted. Incidental CT findings:  None. IMPRESSION: Hypermetabolic 3 cm peripheral left upper lobe mass abutting the pleural surface, consistent with primary bronchogenic carcinoma. Hypermetabolic left hilar and contralateral right paratracheal mediastinal lymph nodes, consistent with metastatic disease. No evidence of metastatic disease within the neck, abdomen, or pelvis. Electronically Signed   By: Marlaine Hind M.D.   On: 04/13/2019 16:27   Dg Chest Port 1 View  Result Date: 04/20/2019 CLINICAL DATA:  Postoperative, bronchoscopy EXAM: PORTABLE CHEST 1 VIEW COMPARISON:  Chest radiograph, 04/20/2019, 11:20 a.m., CT chest, 03/30/2019 FINDINGS: Cardiomegaly status post median sternotomy with left chest single lead pacer defibrillator. A subpleural mass  of the left upper lobe is primarily obscured by pacer control box. No acute abnormality of the lungs. The visualized skeletal structures are unremarkable. IMPRESSION: No acute abnormality of the lungs status post bronchoscopy. A subpleural mass of the  left upper lobe is primarily obscured by pacer control box. Electronically Signed   By: Eddie Candle M.D.   On: 04/20/2019 15:54   Dg Chest Port 1 View  Result Date: 04/20/2019 CLINICAL DATA:  Preop for left lung biopsy. EXAM: PORTABLE CHEST 1 VIEW COMPARISON:  March 30, 2019. FINDINGS: The heart size and mediastinal contours are within normal limits. Status post coronary bypass graft. Right lung is clear. Pleural based mass noted laterally in left upper lobe on prior exam is not visualized currently due to overlying pacemaker. The visualized skeletal structures are unremarkable. IMPRESSION: Left upper lobe pleural-based mass noted on prior exam is not visualized currently due to overlying pacemaker. Electronically Signed   By: Marijo Conception M.D.   On: 04/20/2019 11:39       IMPRESSION/PLAN: 1. At least Stage IIIB, [id number] NSCLC, squamous cell carcinoma of the left upper lobe. Dr. Lisbeth Renshaw discusses the pathology findings and reviews the nature of locally advanced lung cancer.  He discusses the rationale to consider chemoradiation as a means of treatment.  This would be with curative intent.  We discussed the risks, benefits, short, and long term effects of radiotherapy, and the patient is interested in proceeding. Dr. Lisbeth Renshaw discusses the delivery and logistics of radiotherapy and anticipates a course of 6 1/2 weeks of radiotherapy.  We will and to begin the week of 05/08/2019, and our office will be in contact with him to discuss scheduling simulation.  At that time he will sign written consent to proceed. 2. In situ ICD.  We will reach out to Dr. Algernon Huxley his cardiologist to confirm that the patient has permission to proceed with radiotherapy.  As he has this device in place, we would typically do a contrasted CT of the head as his device is already been vented with the MRI department.  Unfortunately it is not the type of device that he can safely undergo any type of MRI procedures.  A noncontrasted CT  would not be as helpful as the patient does have some renal insufficiency.  For this reason Dr. Julien Nordmann has discussed that we will forego brain imaging.  In a visit lasting 45 minutes, greater than 50% of the time was spent face to face discussing his case, and coordinating the patient's care.  The above documentation reflects my direct findings during this shared patient visit. Please see the separate note by Dr. Lisbeth Renshaw on this date for the remainder of the patient's plan of care.    Carola Rhine, PAC

## 2019-05-01 ENCOUNTER — Other Ambulatory Visit: Payer: Self-pay | Admitting: Internal Medicine

## 2019-05-01 ENCOUNTER — Ambulatory Visit: Payer: Medicare HMO

## 2019-05-01 ENCOUNTER — Encounter: Payer: Self-pay | Admitting: *Deleted

## 2019-05-01 ENCOUNTER — Other Ambulatory Visit: Payer: Medicare HMO

## 2019-05-01 NOTE — Progress Notes (Signed)
Oncology Nurse Navigator Documentation  Oncology Nurse Navigator Flowsheets 05/01/2019  Navigator Location [facility]  Referral Date to RadOnc/MedOnc -  Navigator Encounter Type Other/Dr. Julien Nordmann updated me he would like patient's treatment to start on 11/16.  I contacted scheduling dept to schedule this.    Telephone -  Treatment Phase Pre-Tx/Tx Discussion  Barriers/Navigation Needs Coordination of Care  Education -  Interventions Coordination of Care  Acuity Level 2-Minimal Needs (1-2 Barriers Identified)  Coordination of Care Other  Education Method -  Time Spent with Patient 30

## 2019-05-01 NOTE — Progress Notes (Signed)
Oncology Nurse Navigator Documentation  Oncology Nurse Navigator Flowsheets 05/01/2019  Navigator Location [facility]  Referral Date to RadOnc/MedOnc -  Navigator Encounter Type Other/per [name] note he would like to start treatment for Mr. Dubree on 11/16 but as of now his 1st treatment is set for 11/23.  I followed up with Dr. Julien Nordmann on timing and also ct head order.   Telephone -  Treatment Phase Pre-Tx/Tx Discussion  Barriers/Navigation Needs Coordination of Care  Education -  Interventions Coordination of Care  Acuity Level 2-Minimal Needs (1-2 Barriers Identified)  Coordination of Care Other  Education Method -  Time Spent with Patient 30

## 2019-05-02 ENCOUNTER — Encounter: Payer: Self-pay | Admitting: *Deleted

## 2019-05-02 ENCOUNTER — Telehealth: Payer: Self-pay | Admitting: Medical Oncology

## 2019-05-02 NOTE — Telephone Encounter (Signed)
Pt sister wants to be included in education class however you can set it up . I told her she can be on phone,but I will have the chemo nurse call her to confirm.

## 2019-05-02 NOTE — Progress Notes (Signed)
Oncology Nurse Navigator Documentation  Oncology Nurse Navigator Flowsheets 05/02/2019  Abnormal Finding Date 03/30/2019  Confirmed Diagnosis Date 04/20/2019  Diagnosis Status Confirmed Diagnosis Complete  Navigator Location [facility]  Referral Date to RadOnc/MedOnc -  Navigator Encounter Type Telephone;Other/I received a call from [name] [name] sister.  I called her back and she needs help with transportation.  I called transportation services to see if they go to [place].  I was told they do.  I called the sister back to update her.  She was thankful for the help.  I have emailed transportation services with an update on his appt times.    Telephone Incoming Call;Outgoing Call  Treatment Phase Pre-Tx/Tx Discussion  Barriers/Navigation Needs Coordination of Care;Education  Education Other  Interventions Coordination of Care;Education  Acuity Level 3-Moderate Needs (3-4 Barriers Identified)  Coordination of Care Other  Education Method Verbal  Time Spent with Patient 17

## 2019-05-03 ENCOUNTER — Telehealth: Payer: Self-pay | Admitting: Internal Medicine

## 2019-05-03 ENCOUNTER — Telehealth: Payer: Self-pay | Admitting: Emergency Medicine

## 2019-05-03 NOTE — Telephone Encounter (Signed)
LMOM for [facility] RN with Dr Lisbeth Renshaw. Questions concerning radiation treatment.

## 2019-05-03 NOTE — Telephone Encounter (Signed)
Received call from Butch Penny and she stated that ICD will be very close to field of tx . She will need to get further info and will call back.

## 2019-05-04 ENCOUNTER — Ambulatory Visit
Admission: RE | Admit: 2019-05-04 | Discharge: 2019-05-04 | Disposition: A | Discharge status: Home / Self Care | Payer: Medicare HMO | Source: Ambulatory Visit | Attending: Radiation Oncology | Admitting: Radiation Oncology

## 2019-05-04 ENCOUNTER — Inpatient Hospital Stay: Payer: Medicare HMO

## 2019-05-04 ENCOUNTER — Other Ambulatory Visit: Payer: Self-pay

## 2019-05-04 DIAGNOSIS — F17210 Nicotine dependence, cigarettes, uncomplicated: Secondary | ICD-10-CM | POA: Insufficient documentation

## 2019-05-04 DIAGNOSIS — C3412 Malignant neoplasm of upper lobe, left bronchus or lung: Secondary | ICD-10-CM | POA: Diagnosis not present

## 2019-05-04 DIAGNOSIS — Z510 Encounter for antineoplastic radiation therapy: Secondary | ICD-10-CM | POA: Insufficient documentation

## 2019-05-04 NOTE — Telephone Encounter (Signed)
Patient will be receiving non-neutron emitting treatment in area of left chest, The patient's device is not in the field of treatment. Per Dr Caryl Comes patient will need device checks after treatment due to proximity to field.

## 2019-05-05 DIAGNOSIS — Z510 Encounter for antineoplastic radiation therapy: Secondary | ICD-10-CM | POA: Diagnosis not present

## 2019-05-05 DIAGNOSIS — C3412 Malignant neoplasm of upper lobe, left bronchus or lung: Secondary | ICD-10-CM | POA: Diagnosis not present

## 2019-05-05 DIAGNOSIS — F17210 Nicotine dependence, cigarettes, uncomplicated: Secondary | ICD-10-CM | POA: Diagnosis not present

## 2019-05-08 ENCOUNTER — Inpatient Hospital Stay: Payer: Medicare HMO

## 2019-05-08 ENCOUNTER — Encounter: Payer: Self-pay | Admitting: *Deleted

## 2019-05-08 ENCOUNTER — Ambulatory Visit
Admission: RE | Admit: 2019-05-08 | Discharge: 2019-05-08 | Disposition: A | Discharge status: Home / Self Care | Payer: Medicare HMO | Source: Ambulatory Visit | Attending: Radiation Oncology | Admitting: Radiation Oncology

## 2019-05-08 ENCOUNTER — Other Ambulatory Visit: Payer: Self-pay

## 2019-05-08 ENCOUNTER — Encounter: Payer: Self-pay | Admitting: Internal Medicine

## 2019-05-08 VITALS — BP 120/80 | HR 81 | Temp 98.3°F | Resp 19 | Ht 67.5 in | Wt 217.0 lb

## 2019-05-08 DIAGNOSIS — E1136 Type 2 diabetes mellitus with diabetic cataract: Secondary | ICD-10-CM | POA: Diagnosis not present

## 2019-05-08 DIAGNOSIS — C3412 Malignant neoplasm of upper lobe, left bronchus or lung: Secondary | ICD-10-CM | POA: Diagnosis not present

## 2019-05-08 DIAGNOSIS — F17210 Nicotine dependence, cigarettes, uncomplicated: Secondary | ICD-10-CM | POA: Diagnosis not present

## 2019-05-08 DIAGNOSIS — I5022 Chronic systolic (congestive) heart failure: Secondary | ICD-10-CM | POA: Diagnosis not present

## 2019-05-08 DIAGNOSIS — Z510 Encounter for antineoplastic radiation therapy: Secondary | ICD-10-CM | POA: Diagnosis not present

## 2019-05-08 DIAGNOSIS — I110 Hypertensive heart disease with heart failure: Secondary | ICD-10-CM | POA: Diagnosis not present

## 2019-05-08 DIAGNOSIS — I2510 Atherosclerotic heart disease of native coronary artery without angina pectoris: Secondary | ICD-10-CM | POA: Diagnosis not present

## 2019-05-08 DIAGNOSIS — C3492 Malignant neoplasm of unspecified part of left bronchus or lung: Secondary | ICD-10-CM | POA: Diagnosis not present

## 2019-05-08 DIAGNOSIS — R5383 Other fatigue: Secondary | ICD-10-CM | POA: Diagnosis not present

## 2019-05-08 DIAGNOSIS — R091 Pleurisy: Secondary | ICD-10-CM | POA: Diagnosis not present

## 2019-05-08 DIAGNOSIS — R32 Unspecified urinary incontinence: Secondary | ICD-10-CM | POA: Diagnosis not present

## 2019-05-08 DIAGNOSIS — J449 Chronic obstructive pulmonary disease, unspecified: Secondary | ICD-10-CM | POA: Diagnosis not present

## 2019-05-08 DIAGNOSIS — Z5111 Encounter for antineoplastic chemotherapy: Secondary | ICD-10-CM | POA: Diagnosis not present

## 2019-05-08 LAB — CBC WITH DIFFERENTIAL (CANCER CENTER ONLY)
Abs Immature Granulocytes: 0.05 10*3/uL (ref 0.00–0.07)
Basophils Absolute: 0.1 10*3/uL (ref 0.0–0.1)
Basophils Relative: 0 %
Eosinophils Absolute: 0.6 10*3/uL — ABNORMAL HIGH (ref 0.0–0.5)
Eosinophils Relative: 5 %
HCT: 38 % — ABNORMAL LOW (ref 39.0–52.0)
Hemoglobin: 11.8 g/dL — ABNORMAL LOW (ref 13.0–17.0)
Immature Granulocytes: 0 %
Lymphocytes Relative: 14 %
Lymphs Abs: 1.6 10*3/uL (ref 0.7–4.0)
MCH: 27.1 pg (ref 26.0–34.0)
MCHC: 31.1 g/dL (ref 30.0–36.0)
MCV: 87.2 fL (ref 80.0–100.0)
Monocytes Absolute: 0.6 10*3/uL (ref 0.1–1.0)
Monocytes Relative: 6 %
Neutro Abs: 8.7 10*3/uL — ABNORMAL HIGH (ref 1.7–7.7)
Neutrophils Relative %: 75 %
Platelet Count: 258 10*3/uL (ref 150–400)
RBC: 4.36 MIL/uL (ref 4.22–5.81)
RDW: 15.4 % (ref 11.5–15.5)
WBC Count: 11.5 10*3/uL — ABNORMAL HIGH (ref 4.0–10.5)
nRBC: 0 % (ref 0.0–0.2)

## 2019-05-08 LAB — CMP (CANCER CENTER ONLY)
ALT: 9 U/L (ref 0–44)
AST: 11 U/L — ABNORMAL LOW (ref 15–41)
Albumin: 3.7 g/dL (ref 3.5–5.0)
Alkaline Phosphatase: 64 U/L (ref 38–126)
Anion gap: 10 (ref 5–15)
BUN: 23 mg/dL (ref 8–23)
CO2: 20 mmol/L — ABNORMAL LOW (ref 22–32)
Calcium: 9.5 mg/dL (ref 8.9–10.3)
Chloride: 109 mmol/L (ref 98–111)
Creatinine: 1.67 mg/dL — ABNORMAL HIGH (ref 0.61–1.24)
GFR, Est AFR Am: 48 mL/min — ABNORMAL LOW (ref >60)
GFR, Estimated: 42 mL/min — ABNORMAL LOW (ref >60)
Glucose, Bld: 148 mg/dL — ABNORMAL HIGH (ref 70–99)
Potassium: 3.8 mmol/L (ref 3.5–5.1)
Sodium: 139 mmol/L (ref 135–145)
Total Bilirubin: 0.3 mg/dL (ref 0.3–1.2)
Total Protein: 7.4 g/dL (ref 6.5–8.1)

## 2019-05-08 MED ORDER — PALONOSETRON HCL INJECTION 0.25 MG/5ML
0.2500 mg | Freq: Once | INTRAVENOUS | Status: AC
  Administered 2019-05-08: 0.25 mg via INTRAVENOUS

## 2019-05-08 MED ORDER — DIPHENHYDRAMINE HCL 50 MG/ML IJ SOLN
50.0000 mg | Freq: Once | INTRAMUSCULAR | Status: AC
  Administered 2019-05-08: 50 mg via INTRAVENOUS

## 2019-05-08 MED ORDER — SODIUM CHLORIDE 0.9 % IV SOLN
45.0000 mg/m2 | Freq: Once | INTRAVENOUS | Status: AC
  Administered 2019-05-08: 11:00:00 96 mg via INTRAVENOUS
  Filled 2019-05-08: qty 16

## 2019-05-08 MED ORDER — DIPHENHYDRAMINE HCL 50 MG/ML IJ SOLN
INTRAMUSCULAR | Status: AC
  Filled 2019-05-08: qty 1

## 2019-05-08 MED ORDER — SODIUM CHLORIDE 0.9 % IV SOLN
162.6000 mg | Freq: Once | INTRAVENOUS | Status: AC
  Administered 2019-05-08: 160 mg via INTRAVENOUS
  Filled 2019-05-08: qty 16

## 2019-05-08 MED ORDER — FAMOTIDINE IN NACL 20-0.9 MG/50ML-% IV SOLN
INTRAVENOUS | Status: AC
  Filled 2019-05-08: qty 50

## 2019-05-08 MED ORDER — SODIUM CHLORIDE 0.9 % IV SOLN
Freq: Once | INTRAVENOUS | Status: AC
  Administered 2019-05-08: 10:00:00 via INTRAVENOUS
  Filled 2019-05-08: qty 250

## 2019-05-08 MED ORDER — PALONOSETRON HCL INJECTION 0.25 MG/5ML
INTRAVENOUS | Status: AC
  Filled 2019-05-08: qty 5

## 2019-05-08 MED ORDER — SODIUM CHLORIDE 0.9 % IV SOLN
20.0000 mg | Freq: Once | INTRAVENOUS | Status: AC
  Administered 2019-05-08: 20 mg via INTRAVENOUS
  Filled 2019-05-08: qty 20

## 2019-05-08 MED ORDER — FAMOTIDINE IN NACL 20-0.9 MG/50ML-% IV SOLN
20.0000 mg | Freq: Once | INTRAVENOUS | Status: AC
  Administered 2019-05-08: 10:00:00 20 mg via INTRAVENOUS

## 2019-05-08 NOTE — Progress Notes (Signed)
Met with patient to introduce myself as Financial Resource Specialist and to offer available resources. ° °Discussed one-time $700 [facility] grant and qualifications to assist with personal expenses while going through treatment.  ° °Gave him my card if interested in applying and for any additional financial questions or concerns.     °

## 2019-05-08 NOTE — Progress Notes (Signed)
Oncology Nurse Navigator Documentation  Oncology Nurse Navigator Flowsheets 05/08/2019  Abnormal Finding Date -  Confirmed Diagnosis Date -  Diagnosis Status -  Planned Course of Treatment Chemo/Radiation Concurrent  Phase of Treatment Chemo/Radiation Concurrent  Chemo/Radiation Concurrent Actual Start Date: 05/08/2019  Navigator Location [facility]  Referral Date to RadOnc/MedOnc -  Navigator Encounter Type Treatment  Telephone -  Treatment Initiated Date 05/04/2019  Patient Visit Type MedOnc  Treatment Phase Treatment  Barriers/Navigation Needs Education/I spoke with [name] [name] at his first chemo treatment.  I gave and explained information on lung cancer and resources here at the cancer center if he needs them.   Education Newly Diagnosed Cancer Education;Understanding Cancer/ Treatment Options;Other  Interventions Education  Acuity Level 2-Minimal Needs (1-2 Barriers Identified)  Coordination of Care -  Education Method Verbal;Written  Time Spent with Patient 15

## 2019-05-08 NOTE — Telephone Encounter (Signed)
Scheduled for Merlin transmission on 06/28/19 as final radiation treatment is scheduled for 06/27/19.

## 2019-05-08 NOTE — Patient Instructions (Signed)
[facility] Discharge Instructions for Patients Receiving Chemotherapy  Today you received the following chemotherapy agents PAXLITAXEL AND CARBOPLATIN To help prevent nausea and vomiting after your treatment, we encourage you to take your nausea medication ASDIRECTED  If you develop nausea and vomiting that is not controlled by your nausea medication, call the clinic.   BELOW ARE SYMPTOMS THAT SHOULD BE REPORTED IMMEDIATELY:  *FEVER GREATER THAN 100.5 F  *CHILLS WITH OR WITHOUT FEVER  NAUSEA AND VOMITING THAT IS NOT CONTROLLED WITH YOUR NAUSEA MEDICATION  *UNUSUAL SHORTNESS OF BREATH  *UNUSUAL BRUISING OR BLEEDING  TENDERNESS IN MOUTH AND THROAT WITH OR WITHOUT PRESENCE OF ULCERS  *URINARY PROBLEMS  *BOWEL PROBLEMS  UNUSUAL RASH Items with * indicate a potential emergency and should be followed up as soon as possible.  Feel free to call the clinic should you have any questions or concerns. The clinic phone number is [phone].  Please show the [facility] at check-in to the Emergency Department and triage nurse.  Carboplatin injection What is this medicine? CARBOPLATIN (KAR boe pla tin) is a chemotherapy drug. It targets fast dividing cells, like cancer cells, and causes these cells to die. This medicine is used to treat ovarian cancer and many other cancers. This medicine may be used for other purposes; ask your health care provider or pharmacist if you have questions. COMMON BRAND NAME(S): Paraplatin What should I tell my health care provider before I take this medicine? They need to know if you have any of these conditions:  blood disorders  hearing problems  kidney disease  recent or ongoing radiation therapy  an unusual or allergic reaction to carboplatin, cisplatin, other chemotherapy, other medicines, foods, dyes, or preservatives  pregnant or trying to get pregnant  breast-feeding How should I use this medicine? This drug is  usually given as an infusion into a vein. It is administered in a hospital or clinic by a specially trained health care professional. Talk to your pediatrician regarding the use of this medicine in children. Special care may be needed. Overdosage: If you think you have taken too much of this medicine contact a poison control center or emergency room at once. NOTE: This medicine is only for you. Do not share this medicine with others. What if I miss a dose? It is important not to miss a dose. Call your doctor or health care professional if you are unable to keep an appointment. What may interact with this medicine?  medicines for seizures  medicines to increase blood counts like filgrastim, pegfilgrastim, sargramostim  some antibiotics like amikacin, gentamicin, neomycin, streptomycin, tobramycin  vaccines Talk to your doctor or health care professional before taking any of these medicines:  acetaminophen  aspirin  ibuprofen  ketoprofen  naproxen This list may not describe all possible interactions. Give your health care provider a list of all the medicines, herbs, non-prescription drugs, or dietary supplements you use. Also tell them if you smoke, drink alcohol, or use illegal drugs. Some items may interact with your medicine. What should I watch for while using this medicine? Your condition will be monitored carefully while you are receiving this medicine. You will need important blood work done while you are taking this medicine. This drug may make you feel generally unwell. This is not uncommon, as chemotherapy can affect healthy cells as well as cancer cells. Report any side effects. Continue your course of treatment even though you feel ill unless your doctor tells you to  stop. In some cases, you may be given additional medicines to help with side effects. Follow all directions for their use. Call your doctor or health care professional for advice if you get a fever, chills or  sore throat, or other symptoms of a cold or flu. Do not treat yourself. This drug decreases your body's ability to fight infections. Try to avoid being around people who are sick. This medicine may increase your risk to bruise or bleed. Call your doctor or health care professional if you notice any unusual bleeding. Be careful brushing and flossing your teeth or using a toothpick because you may get an infection or bleed more easily. If you have any dental work done, tell your dentist you are receiving this medicine. Avoid taking products that contain aspirin, acetaminophen, ibuprofen, naproxen, or ketoprofen unless instructed by your doctor. These medicines may hide a fever. Do not become pregnant while taking this medicine. Women should inform their doctor if they wish to become pregnant or think they might be pregnant. There is a potential for serious side effects to an unborn child. Talk to your health care professional or pharmacist for more information. Do not breast-feed an infant while taking this medicine. What side effects may I notice from receiving this medicine? Side effects that you should report to your doctor or health care professional as soon as possible:  allergic reactions like skin rash, itching or hives, swelling of the face, lips, or tongue  signs of infection - fever or chills, cough, sore throat, pain or difficulty passing urine  signs of decreased platelets or bleeding - bruising, pinpoint red spots on the skin, black, tarry stools, nosebleeds  signs of decreased red blood cells - unusually weak or tired, fainting spells, lightheadedness  breathing problems  changes in hearing  changes in vision  chest pain  high blood pressure  low blood counts - This drug may decrease the number of white blood cells, red blood cells and platelets. You may be at increased risk for infections and bleeding.  nausea and vomiting  pain, swelling, redness or irritation at the  injection site  pain, tingling, numbness in the hands or feet  problems with balance, talking, walking  trouble passing urine or change in the amount of urine Side effects that usually do not require medical attention (report to your doctor or health care professional if they continue or are bothersome):  hair loss  loss of appetite  metallic taste in the mouth or changes in taste This list may not describe all possible side effects. Call your doctor for medical advice about side effects. You may report side effects to FDA at 1-800-FDA-1088. Where should I keep my medicine? This drug is given in a hospital or clinic and will not be stored at home. NOTE: This sheet is a summary. It may not cover all possible information. If you have questions about this medicine, talk to your doctor, pharmacist, or health care provider.  2020 Elsevier/Gold Standard (2007-09-13 14:38:05)     Paclitaxel injection What is this medicine? PACLITAXEL (PAK li TAX el) is a chemotherapy drug. It targets fast dividing cells, like cancer cells, and causes these cells to die. This medicine is used to treat ovarian cancer, breast cancer, lung cancer, Kaposi's sarcoma, and other cancers. This medicine may be used for other purposes; ask your health care provider or pharmacist if you have questions. COMMON BRAND NAME(S): Onxol, Taxol What should I tell my health care provider before I take this  medicine? They need to know if you have any of these conditions:  history of irregular heartbeat  liver disease  low blood counts, like low white cell, platelet, or red cell counts  lung or breathing disease, like asthma  tingling of the fingers or toes, or other nerve disorder  an unusual or allergic reaction to paclitaxel, alcohol, polyoxyethylated castor oil, other chemotherapy, other medicines, foods, dyes, or preservatives  pregnant or trying to get pregnant  breast-feeding How should I use this  medicine? This drug is given as an infusion into a vein. It is administered in a hospital or clinic by a specially trained health care professional. Talk to your pediatrician regarding the use of this medicine in children. Special care may be needed. Overdosage: If you think you have taken too much of this medicine contact a poison control center or emergency room at once. NOTE: This medicine is only for you. Do not share this medicine with others. What if I miss a dose? It is important not to miss your dose. Call your doctor or health care professional if you are unable to keep an appointment. What may interact with this medicine? Do not take this medicine with any of the following medications:  disulfiram  metronidazole This medicine may also interact with the following medications:  antiviral medicines for hepatitis, HIV or AIDS  certain antibiotics like erythromycin and clarithromycin  certain medicines for fungal infections like ketoconazole and itraconazole  certain medicines for seizures like carbamazepine, phenobarbital, phenytoin  gemfibrozil  nefazodone  rifampin  St. John's wort This list may not describe all possible interactions. Give your health care provider a list of all the medicines, herbs, non-prescription drugs, or dietary supplements you use. Also tell them if you smoke, drink alcohol, or use illegal drugs. Some items may interact with your medicine. What should I watch for while using this medicine? Your condition will be monitored carefully while you are receiving this medicine. You will need important blood work done while you are taking this medicine. This medicine can cause serious allergic reactions. To reduce your risk you will need to take other medicine(s) before treatment with this medicine. If you experience allergic reactions like skin rash, itching or hives, swelling of the face, lips, or tongue, tell your doctor or health care professional right  away. In some cases, you may be given additional medicines to help with side effects. Follow all directions for their use. This drug may make you feel generally unwell. This is not uncommon, as chemotherapy can affect healthy cells as well as cancer cells. Report any side effects. Continue your course of treatment even though you feel ill unless your doctor tells you to stop. Call your doctor or health care professional for advice if you get a fever, chills or sore throat, or other symptoms of a cold or flu. Do not treat yourself. This drug decreases your body's ability to fight infections. Try to avoid being around people who are sick. This medicine may increase your risk to bruise or bleed. Call your doctor or health care professional if you notice any unusual bleeding. Be careful brushing and flossing your teeth or using a toothpick because you may get an infection or bleed more easily. If you have any dental work done, tell your dentist you are receiving this medicine. Avoid taking products that contain aspirin, acetaminophen, ibuprofen, naproxen, or ketoprofen unless instructed by your doctor. These medicines may hide a fever. Do not become pregnant while taking this medicine.  Women should inform their doctor if they wish to become pregnant or think they might be pregnant. There is a potential for serious side effects to an unborn child. Talk to your health care professional or pharmacist for more information. Do not breast-feed an infant while taking this medicine. Men are advised not to father a child while receiving this medicine. This product may contain alcohol. Ask your pharmacist or healthcare provider if this medicine contains alcohol. Be sure to tell all healthcare providers you are taking this medicine. Certain medicines, like metronidazole and disulfiram, can cause an unpleasant reaction when taken with alcohol. The reaction includes flushing, headache, nausea, vomiting, sweating, and  increased thirst. The reaction can last from 30 minutes to several hours. What side effects may I notice from receiving this medicine? Side effects that you should report to your doctor or health care professional as soon as possible:  allergic reactions like skin rash, itching or hives, swelling of the face, lips, or tongue  breathing problems  changes in vision  fast, irregular heartbeat  high or low blood pressure  mouth sores  pain, tingling, numbness in the hands or feet  signs of decreased platelets or bleeding - bruising, pinpoint red spots on the skin, black, tarry stools, blood in the urine  signs of decreased red blood cells - unusually weak or tired, feeling faint or lightheaded, falls  signs of infection - fever or chills, cough, sore throat, pain or difficulty passing urine  signs and symptoms of liver injury like dark yellow or brown urine; general ill feeling or flu-like symptoms; light-colored stools; loss of appetite; nausea; right upper belly pain; unusually weak or tired; yellowing of the eyes or skin  swelling of the ankles, feet, hands  unusually slow heartbeat Side effects that usually do not require medical attention (report to your doctor or health care professional if they continue or are bothersome):  diarrhea  hair loss  loss of appetite  muscle or joint pain  nausea, vomiting  pain, redness, or irritation at site where injected  tiredness This list may not describe all possible side effects. Call your doctor for medical advice about side effects. You may report side effects to FDA at 1-800-FDA-1088. Where should I keep my medicine? This drug is given in a hospital or clinic and will not be stored at home. NOTE: This sheet is a summary. It may not cover all possible information. If you have questions about this medicine, talk to your doctor, pharmacist, or health care provider.  2020 Elsevier/Gold Standard (2017-02-09 13:14:55)

## 2019-05-08 NOTE — Progress Notes (Signed)
OK to treat without today's lab results.

## 2019-05-09 ENCOUNTER — Ambulatory Visit
Admission: RE | Admit: 2019-05-09 | Discharge: 2019-05-09 | Disposition: A | Discharge status: Home / Self Care | Payer: Medicare HMO | Source: Ambulatory Visit | Attending: Radiation Oncology | Admitting: Radiation Oncology

## 2019-05-09 ENCOUNTER — Other Ambulatory Visit: Payer: Self-pay | Admitting: *Deleted

## 2019-05-09 DIAGNOSIS — F17210 Nicotine dependence, cigarettes, uncomplicated: Secondary | ICD-10-CM | POA: Diagnosis not present

## 2019-05-09 DIAGNOSIS — Z510 Encounter for antineoplastic radiation therapy: Secondary | ICD-10-CM | POA: Diagnosis not present

## 2019-05-09 DIAGNOSIS — C3412 Malignant neoplasm of upper lobe, left bronchus or lung: Secondary | ICD-10-CM | POA: Diagnosis not present

## 2019-05-10 ENCOUNTER — Ambulatory Visit
Admission: RE | Admit: 2019-05-10 | Discharge: 2019-05-10 | Disposition: A | Discharge status: Home / Self Care | Payer: Medicare HMO | Source: Ambulatory Visit | Attending: Radiation Oncology | Admitting: Radiation Oncology

## 2019-05-10 ENCOUNTER — Encounter (HOSPITAL_COMMUNITY): Payer: Medicare HMO

## 2019-05-10 ENCOUNTER — Other Ambulatory Visit: Payer: Self-pay

## 2019-05-10 DIAGNOSIS — F17210 Nicotine dependence, cigarettes, uncomplicated: Secondary | ICD-10-CM | POA: Diagnosis not present

## 2019-05-10 DIAGNOSIS — C3412 Malignant neoplasm of upper lobe, left bronchus or lung: Secondary | ICD-10-CM | POA: Diagnosis not present

## 2019-05-10 DIAGNOSIS — Z510 Encounter for antineoplastic radiation therapy: Secondary | ICD-10-CM | POA: Diagnosis not present

## 2019-05-11 ENCOUNTER — Ambulatory Visit
Admission: RE | Admit: 2019-05-11 | Discharge: 2019-05-11 | Disposition: A | Discharge status: Home / Self Care | Payer: Medicare HMO | Source: Ambulatory Visit | Attending: Radiation Oncology | Admitting: Radiation Oncology

## 2019-05-11 DIAGNOSIS — C3412 Malignant neoplasm of upper lobe, left bronchus or lung: Secondary | ICD-10-CM | POA: Diagnosis not present

## 2019-05-11 DIAGNOSIS — F17210 Nicotine dependence, cigarettes, uncomplicated: Secondary | ICD-10-CM | POA: Diagnosis not present

## 2019-05-11 DIAGNOSIS — Z510 Encounter for antineoplastic radiation therapy: Secondary | ICD-10-CM | POA: Diagnosis not present

## 2019-05-12 ENCOUNTER — Other Ambulatory Visit: Payer: Self-pay

## 2019-05-12 ENCOUNTER — Ambulatory Visit
Admission: RE | Admit: 2019-05-12 | Discharge: 2019-05-12 | Disposition: A | Discharge status: Home / Self Care | Payer: Medicare HMO | Source: Ambulatory Visit | Attending: Radiation Oncology | Admitting: Radiation Oncology

## 2019-05-12 DIAGNOSIS — C3412 Malignant neoplasm of upper lobe, left bronchus or lung: Secondary | ICD-10-CM | POA: Diagnosis not present

## 2019-05-12 DIAGNOSIS — F17210 Nicotine dependence, cigarettes, uncomplicated: Secondary | ICD-10-CM | POA: Diagnosis not present

## 2019-05-12 DIAGNOSIS — C3492 Malignant neoplasm of unspecified part of left bronchus or lung: Secondary | ICD-10-CM

## 2019-05-12 DIAGNOSIS — Z510 Encounter for antineoplastic radiation therapy: Secondary | ICD-10-CM | POA: Diagnosis not present

## 2019-05-12 MED ORDER — SONAFINE EX EMUL
1.0000 "application " | Freq: Two times a day (BID) | CUTANEOUS | Status: DC
  Administered 2019-05-12: 1 via TOPICAL

## 2019-05-13 DIAGNOSIS — M109 Gout, unspecified: Secondary | ICD-10-CM | POA: Diagnosis not present

## 2019-05-13 DIAGNOSIS — E7800 Pure hypercholesterolemia, unspecified: Secondary | ICD-10-CM | POA: Diagnosis not present

## 2019-05-13 DIAGNOSIS — I130 Hypertensive heart and chronic kidney disease with heart failure and stage 1 through stage 4 chronic kidney disease, or unspecified chronic kidney disease: Secondary | ICD-10-CM | POA: Diagnosis not present

## 2019-05-13 DIAGNOSIS — I2510 Atherosclerotic heart disease of native coronary artery without angina pectoris: Secondary | ICD-10-CM | POA: Diagnosis not present

## 2019-05-13 DIAGNOSIS — E1140 Type 2 diabetes mellitus with diabetic neuropathy, unspecified: Secondary | ICD-10-CM | POA: Diagnosis not present

## 2019-05-13 DIAGNOSIS — N1831 Chronic kidney disease, stage 3a: Secondary | ICD-10-CM | POA: Diagnosis not present

## 2019-05-13 DIAGNOSIS — J449 Chronic obstructive pulmonary disease, unspecified: Secondary | ICD-10-CM | POA: Diagnosis not present

## 2019-05-13 DIAGNOSIS — I509 Heart failure, unspecified: Secondary | ICD-10-CM | POA: Diagnosis not present

## 2019-05-13 DIAGNOSIS — E1151 Type 2 diabetes mellitus with diabetic peripheral angiopathy without gangrene: Secondary | ICD-10-CM | POA: Diagnosis not present

## 2019-05-14 ENCOUNTER — Other Ambulatory Visit (HOSPITAL_COMMUNITY): Payer: Self-pay | Admitting: Cardiology

## 2019-05-15 ENCOUNTER — Other Ambulatory Visit: Payer: Self-pay

## 2019-05-15 ENCOUNTER — Ambulatory Visit
Admission: RE | Admit: 2019-05-15 | Discharge: 2019-05-15 | Disposition: A | Discharge status: Home / Self Care | Payer: Medicare HMO | Source: Ambulatory Visit | Attending: Radiation Oncology | Admitting: Radiation Oncology

## 2019-05-15 ENCOUNTER — Inpatient Hospital Stay (HOSPITAL_BASED_OUTPATIENT_CLINIC_OR_DEPARTMENT_OTHER): Payer: Medicare HMO | Admitting: Internal Medicine

## 2019-05-15 ENCOUNTER — Telehealth: Payer: Self-pay | Admitting: Internal Medicine

## 2019-05-15 ENCOUNTER — Inpatient Hospital Stay: Payer: Medicare HMO

## 2019-05-15 ENCOUNTER — Telehealth: Payer: Self-pay

## 2019-05-15 ENCOUNTER — Encounter: Payer: Self-pay | Admitting: Internal Medicine

## 2019-05-15 VITALS — BP 129/71 | HR 87 | Temp 98.2°F | Resp 18 | Ht 67.0 in | Wt 214.9 lb

## 2019-05-15 DIAGNOSIS — I5022 Chronic systolic (congestive) heart failure: Secondary | ICD-10-CM | POA: Diagnosis not present

## 2019-05-15 DIAGNOSIS — C3492 Malignant neoplasm of unspecified part of left bronchus or lung: Secondary | ICD-10-CM | POA: Diagnosis not present

## 2019-05-15 DIAGNOSIS — R5383 Other fatigue: Secondary | ICD-10-CM | POA: Diagnosis not present

## 2019-05-15 DIAGNOSIS — J449 Chronic obstructive pulmonary disease, unspecified: Secondary | ICD-10-CM | POA: Diagnosis not present

## 2019-05-15 DIAGNOSIS — Z5111 Encounter for antineoplastic chemotherapy: Secondary | ICD-10-CM

## 2019-05-15 DIAGNOSIS — I2510 Atherosclerotic heart disease of native coronary artery without angina pectoris: Secondary | ICD-10-CM | POA: Diagnosis not present

## 2019-05-15 DIAGNOSIS — Z510 Encounter for antineoplastic radiation therapy: Secondary | ICD-10-CM | POA: Diagnosis not present

## 2019-05-15 DIAGNOSIS — I110 Hypertensive heart disease with heart failure: Secondary | ICD-10-CM | POA: Diagnosis not present

## 2019-05-15 DIAGNOSIS — C3412 Malignant neoplasm of upper lobe, left bronchus or lung: Secondary | ICD-10-CM | POA: Diagnosis not present

## 2019-05-15 DIAGNOSIS — I10 Essential (primary) hypertension: Secondary | ICD-10-CM | POA: Diagnosis not present

## 2019-05-15 DIAGNOSIS — R091 Pleurisy: Secondary | ICD-10-CM | POA: Diagnosis not present

## 2019-05-15 DIAGNOSIS — F17210 Nicotine dependence, cigarettes, uncomplicated: Secondary | ICD-10-CM | POA: Diagnosis not present

## 2019-05-15 DIAGNOSIS — E1136 Type 2 diabetes mellitus with diabetic cataract: Secondary | ICD-10-CM | POA: Diagnosis not present

## 2019-05-15 LAB — CMP (CANCER CENTER ONLY)
ALT: 9 U/L (ref 0–44)
AST: 12 U/L — ABNORMAL LOW (ref 15–41)
Albumin: 3.3 g/dL — ABNORMAL LOW (ref 3.5–5.0)
Alkaline Phosphatase: 60 U/L (ref 38–126)
Anion gap: 9 (ref 5–15)
BUN: 21 mg/dL (ref 8–23)
CO2: 20 mmol/L — ABNORMAL LOW (ref 22–32)
Calcium: 9.1 mg/dL (ref 8.9–10.3)
Chloride: 109 mmol/L (ref 98–111)
Creatinine: 1.46 mg/dL — ABNORMAL HIGH (ref 0.61–1.24)
GFR, Est AFR Am: 57 mL/min — ABNORMAL LOW (ref >60)
GFR, Estimated: 49 mL/min — ABNORMAL LOW (ref >60)
Glucose, Bld: 109 mg/dL — ABNORMAL HIGH (ref 70–99)
Potassium: 4.2 mmol/L (ref 3.5–5.1)
Sodium: 138 mmol/L (ref 135–145)
Total Bilirubin: 0.5 mg/dL (ref 0.3–1.2)
Total Protein: 6.9 g/dL (ref 6.5–8.1)

## 2019-05-15 LAB — CBC WITH DIFFERENTIAL (CANCER CENTER ONLY)
Abs Immature Granulocytes: 0.1 10*3/uL — ABNORMAL HIGH (ref 0.00–0.07)
Basophils Absolute: 0 10*3/uL (ref 0.0–0.1)
Basophils Relative: 0 %
Eosinophils Absolute: 0.4 10*3/uL (ref 0.0–0.5)
Eosinophils Relative: 5 %
HCT: 34.7 % — ABNORMAL LOW (ref 39.0–52.0)
Hemoglobin: 11 g/dL — ABNORMAL LOW (ref 13.0–17.0)
Immature Granulocytes: 1 %
Lymphocytes Relative: 11 %
Lymphs Abs: 1 10*3/uL (ref 0.7–4.0)
MCH: 27.3 pg (ref 26.0–34.0)
MCHC: 31.7 g/dL (ref 30.0–36.0)
MCV: 86.1 fL (ref 80.0–100.0)
Monocytes Absolute: 0.7 10*3/uL (ref 0.1–1.0)
Monocytes Relative: 7 %
Neutro Abs: 7.3 10*3/uL (ref 1.7–7.7)
Neutrophils Relative %: 76 %
Platelet Count: 245 10*3/uL (ref 150–400)
RBC: 4.03 MIL/uL — ABNORMAL LOW (ref 4.22–5.81)
RDW: 15.6 % — ABNORMAL HIGH (ref 11.5–15.5)
WBC Count: 9.5 10*3/uL (ref 4.0–10.5)
nRBC: 0 % (ref 0.0–0.2)

## 2019-05-15 NOTE — Telephone Encounter (Signed)
R/s appt per 11/23 sch message - pt aware of new appt date and time . Emailed transportation to let them know about appt added.

## 2019-05-15 NOTE — Telephone Encounter (Signed)
Went out to lobby to call patient back for his infusion appointment. Unable to locate patient. Called patient and patient said he went home after his visit with Dr. Julien Nordmann. Patient stated he is not able to return to the office for his treatment today. Marcia Brash, RN made aware and she will make Dr. Julien Nordmann aware. [name] nurse T. Matthew Saras, RN will also send a scheduling message to get patient rescheduled.

## 2019-05-15 NOTE — Progress Notes (Signed)
[place] [phone]   [phone]  OFFICE PROGRESS NOTE  [name], PA-C [place] [address]  DIAGNOSIS: stage IIIc (T2 a, N3, M0) non-small cell lung cancer, squamous cell carcinoma diagnosed in October 2020 and presented with left upper lobe pleural-based mass in addition to left hilar and right paratracheal lymphadenopathy.  PRIOR THERAPY:None.  CURRENT THERAPY: Concurrent chemoradiation with weekly carboplatin for AUC of 2 and paclitaxel 45 mg/M2.  First dose was on May 08, 2019.  Status post 1 cycle.  INTERVAL HISTORY: [name] [name] 68 y.o. male returns to the clinic today for follow-up visit.  The patient is feeling fine today with no concerning complaints except for some soreness on the left side of the chest.  He denied having any shortness of breath, cough or hemoptysis.  He denied having any nausea, vomiting, diarrhea or constipation.  He has no headache or visual changes.  He tolerated the first week of his treatment fairly well.  He is here today for evaluation before starting cycle #2 of his chemotherapy.  MEDICAL HISTORY: Past Medical History:  Diagnosis Date   AICD (automatic cardioverter/defibrillator) present    reports no shocks  - st Jude   Ambulates with cane    CAD (coronary artery disease)    a. 04/2013 Cath: LM 10, LAD 40p, 37m, D1 50p, LCX 60p, 47m/100m, OM1 50, OM2 100 L-L collats, RCA 100p;  b. CABG x 5: LIMA->LAD, VG->D2, VG->RI->OM2, VG->PDA.   Cataracts, bilateral    Chronic systolic CHF (congestive heart failure) ([place])    a. 04/2013 Echo: EF 15-20%, diff HK, sev HK of inf and ant myocardium, dilated LA,  b. EF 20-25% with restrictive filling pattern, mod RV dysfx, mild MR (10/10/2013);  c.  Echo 12/15: EF 25-30%   COPD (chronic obstructive pulmonary disease) ([place])    Diabetes mellitus without complication ([place])    unsure what type but was dx as an adult, takes no meds, check his cbg  at home 2 times a week    Emphysema    GERD (gastroesophageal reflux disease)    Gout    Hyperlipidemia    Hypertension    Ischemic cardiomyopathy    PVD (peripheral vascular disease) ([place])    a. (10/2013) ABIs: RIGHT 0.63, Waveforms: monophasic;  LEFT 0.78, Waveforms: monophasic   Tobacco abuse    30+ pack-year history   Transaminitis    a. 04/2013 Abd U/S and CT unremarkable, hepatitis panel neg -->felt to be 2/2 acute R heart failure.    ALLERGIES:  is allergic to penicillins.  MEDICATIONS:  Current Outpatient Medications  Medication Sig Dispense Refill   albuterol (VENTOLIN HFA) 108 (90 Base) MCG/ACT inhaler Inhale 1-2 puffs into the lungs every 6 (six) hours as needed for wheezing or shortness of breath. 6.7 g 6   allopurinol (ZYLOPRIM) 300 MG tablet Take 300 mg by mouth daily.     aspirin 81 MG chewable tablet Chew 1 tablet (81 mg total) by mouth 2 (two) times daily. 60 tablet 1   atorvastatin (LIPITOR) 20 MG tablet Take 20 mg by mouth daily.     azelastine (OPTIVAR) 0.05 % ophthalmic solution Place 1 drop into the left eye 2 (two) times daily.   0   b complex vitamins tablet Take 1 tablet by mouth daily.     budesonide-formoterol (SYMBICORT) 160-4.5 MCG/ACT inhaler Inhale 2 puffs into the lungs 2 (two) times daily. 1 Inhaler 6  carvedilol (COREG) 6.25 MG tablet Take 6.25 mg by mouth 2 (two) times daily with a meal.      celecoxib (CELEBREX) 200 MG capsule Take 200 mg by mouth 2 (two) times daily as needed for mild pain.     colchicine (COLCRYS) 0.6 MG tablet Take 0.6 mg by mouth daily as needed (gout). At on set .012 mg gout and 0.6 mg an hour later. No more that three tablets a day     docusate sodium (COLACE) 100 MG capsule Take 1 capsule (100 mg total) by mouth 2 (two) times daily. 60 capsule 1   ENTRESTO 49-51 MG Take 1 tablet by mouth 2 (two) times daily.     furosemide (LASIX) 40 MG tablet Take 40 mg by mouth 2 (two) times daily.      hydrALAZINE  (APRESOLINE) 25 MG tablet TAKE 0.5 TABLETS (12.5 MG TOTAL) BY MOUTH 3 (THREE) TIMES DAILY. 45 tablet 3   HYDROcodone-acetaminophen (NORCO/VICODIN) 5-325 MG tablet Take 1 tablet by mouth every 4 (four) hours as needed. 10 tablet 0   isosorbide mononitrate (IMDUR) 30 MG 24 hr tablet TAKE 1 TABLET BY MOUTH EVERY DAY 30 tablet 3   omeprazole (PRILOSEC) 40 MG capsule Take 40 mg by mouth daily.     ondansetron (ZOFRAN) 4 MG tablet Take 1 tablet (4 mg total) by mouth every 6 (six) hours as needed for nausea. 20 tablet 0   Potassium Chloride ER 20 MEQ TBCR Take 20 mEq by mouth daily.      prochlorperazine (COMPAZINE) 10 MG tablet Take 1 tablet (10 mg total) by mouth every 6 (six) hours as needed for nausea or vomiting. 30 tablet 0   senna (SENOKOT) 8.6 MG TABS tablet Take 1 tablet (8.6 mg total) by mouth 2 (two) times daily. 120 each 0   spironolactone (ALDACTONE) 25 MG tablet Take 0.5 tablets (12.5 mg total) by mouth daily. 45 tablet 1   tiotropium (SPIRIVA) 18 MCG inhalation capsule Place 18 mcg into inhaler and inhale daily.     tiZANidine (ZANAFLEX) 4 MG tablet Take 4 mg by mouth every 6 (six) hours as needed for muscle spasms.      No current facility-administered medications for this visit.     SURGICAL HISTORY:  Past Surgical History:  Procedure Laterality Date   APPENDECTOMY  as a child   CARDIAC CATHETERIZATION  04/08/2015   Procedure: Left Heart Cath and Cors/Grafts Angiography;  Surgeon: Peter M Martinique, MD;  Location: [facility];  Service: Cardiovascular;;   COLONOSCOPY     CORONARY ARTERY BYPASS GRAFT N/A 05/05/2013   Procedure: CORONARY ARTERY BYPASS GRAFTING (CABG) TIMES FIVE USING LEFT INTERNAL MAMMARY ARTERY AND RIGHT AND LEFT SAPHENOUS LEG VEIN HARVESTED ENDOSCOPICALLY;  Surgeon: Melrose Nakayama, MD;  Location: [facility];  Service: Open Heart Surgery;  Laterality: N/A;   IMPLANTABLE CARDIOVERTER DEFIBRILLATOR IMPLANT N/A 07/06/2014   Procedure: St Jude  IMPLANTABLE CARDIOVERTER DEFIBRILLATOR IMPLANT;  Surgeon: Deboraha Sprang, MD;  Location: [facility];  Service: Cardiovascular;  Laterality: N/A;   LEFT AND RIGHT HEART CATHETERIZATION WITH CORONARY ANGIOGRAM N/A 04/28/2013   Procedure: LEFT AND RIGHT HEART CATHETERIZATION WITH CORONARY ANGIOGRAM;  Surgeon: Burnell Blanks, MD;  Location: [facility];  Service: Cardiovascular;  Laterality: N/A;   MULTIPLE EXTRACTIONS WITH ALVEOLOPLASTY N/A 05/03/2013   Procedure: Extraction of tooth #s 3,4,5,6,8,9,27 with alveoloplast and maxillary left osseous tuberosity reduction;  Surgeon: Lenn Cal, DDS;  Location: [facility];  Service: Oral Surgery;  Laterality: N/A;   TOTAL HIP ARTHROPLASTY Right 11/30/2018   Procedure: TOTAL HIP ARTHROPLASTY ANTERIOR APPROACH;  Surgeon: Rod Can, MD;  Location: [facility];  Service: Orthopedics;  Laterality: Right;   VIDEO BRONCHOSCOPY WITH ENDOBRONCHIAL ULTRASOUND N/A 04/20/2019   Procedure: VIDEO BRONCHOSCOPY WITH ENDOBRONCHIAL ULTRASOUND;  Surgeon: Margaretha Seeds, MD;  Location: [facility];  Service: Thoracic;  Laterality: N/A;    REVIEW OF SYSTEMS:  A comprehensive review of systems was negative except for: Respiratory: positive for pleurisy/chest pain   PHYSICAL EXAMINATION: General appearance: alert, cooperative and no distress Head: Normocephalic, without obvious abnormality, atraumatic Neck: no adenopathy, no JVD, supple, symmetrical, trachea midline and thyroid not enlarged, symmetric, no tenderness/mass/nodules Lymph nodes: Cervical, supraclavicular, and axillary nodes normal. Resp: clear to auscultation bilaterally Back: symmetric, no curvature. ROM normal. No CVA tenderness. Cardio: regular rate and rhythm, S1, S2 normal, no murmur, click, rub or gallop GI: soft, non-tender; bowel sounds normal; no masses,  no organomegaly Extremities: extremities normal, atraumatic, no cyanosis or edema  ECOG PERFORMANCE STATUS: 1 - Symptomatic but completely  ambulatory  Blood pressure 129/71, pulse 87, temperature 98.2 F (36.8 C), temperature source Temporal, resp. rate 18, height 5\' 7"  (1.702 m), weight 214 lb 14.4 oz (97.5 kg), SpO2 92 %.  LABORATORY DATA: Lab Results  Component Value Date   WBC 9.5 05/15/2019   HGB 11.0 (L) 05/15/2019   HCT 34.7 (L) 05/15/2019   MCV 86.1 05/15/2019   PLT 245 05/15/2019      Chemistry      Component Value Date/Time   NA 138 05/15/2019 1025   NA 141 08/31/2017 1433   K 4.2 05/15/2019 1025   CL 109 05/15/2019 1025   CO2 20 (L) 05/15/2019 1025   BUN 21 05/15/2019 1025   BUN 33 (H) 08/31/2017 1433   CREATININE 1.46 (H) 05/15/2019 1025   CREATININE 1.35 (H) 12/25/2015 1310      Component Value Date/Time   CALCIUM 9.1 05/15/2019 1025   ALKPHOS 60 05/15/2019 1025   AST 12 (L) 05/15/2019 1025   ALT 9 05/15/2019 1025   BILITOT 0.5 05/15/2019 1025       RADIOGRAPHIC STUDIES: Dg Chest Port 1 View  Result Date: 04/20/2019 CLINICAL DATA:  Postoperative, bronchoscopy EXAM: PORTABLE CHEST 1 VIEW COMPARISON:  Chest radiograph, 04/20/2019, 11:20 a.m., CT chest, 03/30/2019 FINDINGS: Cardiomegaly status post median sternotomy with left chest single lead pacer defibrillator. A subpleural mass of the left upper lobe is primarily obscured by pacer control box. No acute abnormality of the lungs. The visualized skeletal structures are unremarkable. IMPRESSION: No acute abnormality of the lungs status post bronchoscopy. A subpleural mass of the left upper lobe is primarily obscured by pacer control box. Electronically Signed   By: Eddie Candle M.D.   On: 04/20/2019 15:54   Dg Chest Port 1 View  Result Date: 04/20/2019 CLINICAL DATA:  Preop for left lung biopsy. EXAM: PORTABLE CHEST 1 VIEW COMPARISON:  March 30, 2019. FINDINGS: The heart size and mediastinal contours are within normal limits. Status post coronary bypass graft. Right lung is clear. Pleural based mass noted laterally in left upper lobe on prior  exam is not visualized currently due to overlying pacemaker. The visualized skeletal structures are unremarkable. IMPRESSION: Left upper lobe pleural-based mass noted on prior exam is not visualized currently due to overlying pacemaker. Electronically Signed   By: Marijo Conception M.D.   On: 04/20/2019 11:39    ASSESSMENT AND PLAN: This is a very pleasant  68 years old male with stage IIIc non-small cell lung cancer, squamous cell carcinoma presented with left upper lobe pleural-based mass in addition to left hilar and right paramediastinal lymphadenopathy. The patient is currently undergoing a course of concurrent chemoradiation with weekly carboplatin and paclitaxel status post 1 cycle.  He tolerated the first cycle of his treatment well with no concerning adverse effects. I recommended for the patient to proceed with cycle #2 today as planned. I will see him back for follow-up visit in 2 weeks for evaluation before starting cycle #4. The patient was advised to call immediately if he has any concerning symptoms in the interval. The patient voices understanding of current disease status and treatment options and is in agreement with the current care plan.  All questions were answered. The patient knows to call the clinic with any problems, questions or concerns. We can certainly see the patient much sooner if necessary.  I spent 10 minutes counseling the patient face to face. The total time spent in the appointment was 15 minutes.  Disclaimer: This note was dictated with voice recognition software. Similar sounding words can inadvertently be transcribed and may not be corrected upon review.

## 2019-05-16 ENCOUNTER — Other Ambulatory Visit: Payer: Self-pay

## 2019-05-16 ENCOUNTER — Ambulatory Visit
Admission: RE | Admit: 2019-05-16 | Discharge: 2019-05-16 | Disposition: A | Discharge status: Home / Self Care | Payer: Medicare HMO | Source: Ambulatory Visit | Attending: Radiation Oncology | Admitting: Radiation Oncology

## 2019-05-16 ENCOUNTER — Inpatient Hospital Stay: Payer: Medicare HMO

## 2019-05-16 VITALS — BP 93/63 | HR 95 | Temp 98.2°F | Resp 16

## 2019-05-16 DIAGNOSIS — F17210 Nicotine dependence, cigarettes, uncomplicated: Secondary | ICD-10-CM | POA: Diagnosis not present

## 2019-05-16 DIAGNOSIS — J449 Chronic obstructive pulmonary disease, unspecified: Secondary | ICD-10-CM | POA: Diagnosis not present

## 2019-05-16 DIAGNOSIS — Z510 Encounter for antineoplastic radiation therapy: Secondary | ICD-10-CM | POA: Diagnosis not present

## 2019-05-16 DIAGNOSIS — I5022 Chronic systolic (congestive) heart failure: Secondary | ICD-10-CM | POA: Diagnosis not present

## 2019-05-16 DIAGNOSIS — C3492 Malignant neoplasm of unspecified part of left bronchus or lung: Secondary | ICD-10-CM | POA: Diagnosis not present

## 2019-05-16 DIAGNOSIS — C3412 Malignant neoplasm of upper lobe, left bronchus or lung: Secondary | ICD-10-CM | POA: Diagnosis not present

## 2019-05-16 DIAGNOSIS — R091 Pleurisy: Secondary | ICD-10-CM | POA: Diagnosis not present

## 2019-05-16 DIAGNOSIS — I2510 Atherosclerotic heart disease of native coronary artery without angina pectoris: Secondary | ICD-10-CM | POA: Diagnosis not present

## 2019-05-16 DIAGNOSIS — I110 Hypertensive heart disease with heart failure: Secondary | ICD-10-CM | POA: Diagnosis not present

## 2019-05-16 DIAGNOSIS — E1136 Type 2 diabetes mellitus with diabetic cataract: Secondary | ICD-10-CM | POA: Diagnosis not present

## 2019-05-16 DIAGNOSIS — R5383 Other fatigue: Secondary | ICD-10-CM | POA: Diagnosis not present

## 2019-05-16 DIAGNOSIS — Z5111 Encounter for antineoplastic chemotherapy: Secondary | ICD-10-CM | POA: Diagnosis not present

## 2019-05-16 MED ORDER — PALONOSETRON HCL INJECTION 0.25 MG/5ML
INTRAVENOUS | Status: AC
  Filled 2019-05-16: qty 5

## 2019-05-16 MED ORDER — SODIUM CHLORIDE 0.9 % IV SOLN
20.0000 mg | Freq: Once | INTRAVENOUS | Status: AC
  Administered 2019-05-16: 20 mg via INTRAVENOUS
  Filled 2019-05-16: qty 2

## 2019-05-16 MED ORDER — DIPHENHYDRAMINE HCL 50 MG/ML IJ SOLN
50.0000 mg | Freq: Once | INTRAMUSCULAR | Status: AC
  Administered 2019-05-16: 50 mg via INTRAVENOUS

## 2019-05-16 MED ORDER — DIPHENHYDRAMINE HCL 50 MG/ML IJ SOLN
INTRAMUSCULAR | Status: AC
  Filled 2019-05-16: qty 1

## 2019-05-16 MED ORDER — SODIUM CHLORIDE 0.9 % IV SOLN
45.0000 mg/m2 | Freq: Once | INTRAVENOUS | Status: AC
  Administered 2019-05-16: 96 mg via INTRAVENOUS
  Filled 2019-05-16: qty 16

## 2019-05-16 MED ORDER — SODIUM CHLORIDE 0.9 % IV SOLN
Freq: Once | INTRAVENOUS | Status: AC
  Administered 2019-05-16: 10:00:00 via INTRAVENOUS
  Filled 2019-05-16: qty 250

## 2019-05-16 MED ORDER — FAMOTIDINE IN NACL 20-0.9 MG/50ML-% IV SOLN
20.0000 mg | Freq: Once | INTRAVENOUS | Status: AC
  Administered 2019-05-16: 20 mg via INTRAVENOUS

## 2019-05-16 MED ORDER — FAMOTIDINE IN NACL 20-0.9 MG/50ML-% IV SOLN
INTRAVENOUS | Status: AC
  Filled 2019-05-16: qty 50

## 2019-05-16 MED ORDER — SODIUM CHLORIDE 0.9 % IV SOLN
160.0000 mg | Freq: Once | INTRAVENOUS | Status: AC
  Administered 2019-05-16: 160 mg via INTRAVENOUS
  Filled 2019-05-16: qty 16

## 2019-05-16 MED ORDER — PALONOSETRON HCL INJECTION 0.25 MG/5ML
0.2500 mg | Freq: Once | INTRAVENOUS | Status: AC
  Administered 2019-05-16: 0.25 mg via INTRAVENOUS

## 2019-05-16 NOTE — Progress Notes (Signed)
Per Dr Julien Nordmann -nursing may administer carboplatin dose of 160 mg today . Pharmacy notified.

## 2019-05-16 NOTE — Patient Instructions (Signed)
[facility] Discharge Instructions for Patients Receiving Chemotherapy  Today you received the following chemotherapy agents taxol/carboplatin  To help prevent nausea and vomiting after your treatment, we encourage you to take your nausea medication as directed   If you develop nausea and vomiting that is not controlled by your nausea medication, call the clinic.   BELOW ARE SYMPTOMS THAT SHOULD BE REPORTED IMMEDIATELY:  *FEVER GREATER THAN 100.5 F  *CHILLS WITH OR WITHOUT FEVER  NAUSEA AND VOMITING THAT IS NOT CONTROLLED WITH YOUR NAUSEA MEDICATION  *UNUSUAL SHORTNESS OF BREATH  *UNUSUAL BRUISING OR BLEEDING  TENDERNESS IN MOUTH AND THROAT WITH OR WITHOUT PRESENCE OF ULCERS  *URINARY PROBLEMS  *BOWEL PROBLEMS  UNUSUAL RASH Items with * indicate a potential emergency and should be followed up as soon as possible.  Feel free to call the clinic you have any questions or concerns. The clinic phone number is [phone].  

## 2019-05-17 ENCOUNTER — Ambulatory Visit
Admission: RE | Admit: 2019-05-17 | Discharge: 2019-05-17 | Disposition: A | Discharge status: Home / Self Care | Payer: Medicare HMO | Source: Ambulatory Visit | Attending: Radiation Oncology | Admitting: Radiation Oncology

## 2019-05-17 ENCOUNTER — Other Ambulatory Visit: Payer: Self-pay

## 2019-05-17 DIAGNOSIS — C3412 Malignant neoplasm of upper lobe, left bronchus or lung: Secondary | ICD-10-CM | POA: Diagnosis not present

## 2019-05-17 DIAGNOSIS — Z510 Encounter for antineoplastic radiation therapy: Secondary | ICD-10-CM | POA: Diagnosis not present

## 2019-05-17 DIAGNOSIS — F17210 Nicotine dependence, cigarettes, uncomplicated: Secondary | ICD-10-CM | POA: Diagnosis not present

## 2019-05-20 LAB — FUNGUS CULTURE RESULT

## 2019-05-20 LAB — FUNGUS CULTURE WITH STAIN

## 2019-05-20 LAB — FUNGAL ORGANISM REFLEX

## 2019-05-22 ENCOUNTER — Ambulatory Visit
Admission: RE | Admit: 2019-05-22 | Discharge: 2019-05-22 | Disposition: A | Discharge status: Home / Self Care | Payer: Medicare HMO | Source: Ambulatory Visit | Attending: Radiation Oncology | Admitting: Radiation Oncology

## 2019-05-22 ENCOUNTER — Inpatient Hospital Stay: Payer: Medicare HMO

## 2019-05-22 ENCOUNTER — Other Ambulatory Visit: Payer: Self-pay

## 2019-05-22 ENCOUNTER — Other Ambulatory Visit (HOSPITAL_COMMUNITY): Payer: Self-pay | Admitting: Cardiology

## 2019-05-22 ENCOUNTER — Other Ambulatory Visit: Payer: Self-pay | Admitting: Medical

## 2019-05-22 VITALS — BP 107/70 | HR 99 | Temp 97.5°F | Resp 20

## 2019-05-22 DIAGNOSIS — I130 Hypertensive heart and chronic kidney disease with heart failure and stage 1 through stage 4 chronic kidney disease, or unspecified chronic kidney disease: Secondary | ICD-10-CM | POA: Diagnosis not present

## 2019-05-22 DIAGNOSIS — C3492 Malignant neoplasm of unspecified part of left bronchus or lung: Secondary | ICD-10-CM

## 2019-05-22 DIAGNOSIS — I5022 Chronic systolic (congestive) heart failure: Secondary | ICD-10-CM | POA: Diagnosis not present

## 2019-05-22 DIAGNOSIS — C3412 Malignant neoplasm of upper lobe, left bronchus or lung: Secondary | ICD-10-CM | POA: Diagnosis not present

## 2019-05-22 DIAGNOSIS — Z5111 Encounter for antineoplastic chemotherapy: Secondary | ICD-10-CM | POA: Diagnosis not present

## 2019-05-22 DIAGNOSIS — J449 Chronic obstructive pulmonary disease, unspecified: Secondary | ICD-10-CM | POA: Diagnosis not present

## 2019-05-22 DIAGNOSIS — E1151 Type 2 diabetes mellitus with diabetic peripheral angiopathy without gangrene: Secondary | ICD-10-CM | POA: Diagnosis not present

## 2019-05-22 DIAGNOSIS — E1136 Type 2 diabetes mellitus with diabetic cataract: Secondary | ICD-10-CM | POA: Diagnosis not present

## 2019-05-22 DIAGNOSIS — I2510 Atherosclerotic heart disease of native coronary artery without angina pectoris: Secondary | ICD-10-CM | POA: Diagnosis not present

## 2019-05-22 DIAGNOSIS — I509 Heart failure, unspecified: Secondary | ICD-10-CM | POA: Diagnosis not present

## 2019-05-22 DIAGNOSIS — N1831 Chronic kidney disease, stage 3a: Secondary | ICD-10-CM | POA: Diagnosis not present

## 2019-05-22 DIAGNOSIS — R5383 Other fatigue: Secondary | ICD-10-CM | POA: Diagnosis not present

## 2019-05-22 DIAGNOSIS — I110 Hypertensive heart disease with heart failure: Secondary | ICD-10-CM | POA: Diagnosis not present

## 2019-05-22 DIAGNOSIS — Z510 Encounter for antineoplastic radiation therapy: Secondary | ICD-10-CM | POA: Diagnosis not present

## 2019-05-22 DIAGNOSIS — M109 Gout, unspecified: Secondary | ICD-10-CM | POA: Diagnosis not present

## 2019-05-22 DIAGNOSIS — G893 Neoplasm related pain (acute) (chronic): Secondary | ICD-10-CM

## 2019-05-22 DIAGNOSIS — E7800 Pure hypercholesterolemia, unspecified: Secondary | ICD-10-CM | POA: Diagnosis not present

## 2019-05-22 DIAGNOSIS — F17210 Nicotine dependence, cigarettes, uncomplicated: Secondary | ICD-10-CM | POA: Diagnosis not present

## 2019-05-22 DIAGNOSIS — R091 Pleurisy: Secondary | ICD-10-CM | POA: Diagnosis not present

## 2019-05-22 DIAGNOSIS — E1140 Type 2 diabetes mellitus with diabetic neuropathy, unspecified: Secondary | ICD-10-CM | POA: Diagnosis not present

## 2019-05-22 LAB — CBC WITH DIFFERENTIAL (CANCER CENTER ONLY)
Abs Immature Granulocytes: 0.09 10*3/uL — ABNORMAL HIGH (ref 0.00–0.07)
Basophils Absolute: 0 10*3/uL (ref 0.0–0.1)
Basophils Relative: 0 %
Eosinophils Absolute: 0.2 10*3/uL (ref 0.0–0.5)
Eosinophils Relative: 3 %
HCT: 33.1 % — ABNORMAL LOW (ref 39.0–52.0)
Hemoglobin: 10.3 g/dL — ABNORMAL LOW (ref 13.0–17.0)
Immature Granulocytes: 1 %
Lymphocytes Relative: 10 %
Lymphs Abs: 0.7 10*3/uL (ref 0.7–4.0)
MCH: 27.4 pg (ref 26.0–34.0)
MCHC: 31.1 g/dL (ref 30.0–36.0)
MCV: 88 fL (ref 80.0–100.0)
Monocytes Absolute: 0.6 10*3/uL (ref 0.1–1.0)
Monocytes Relative: 9 %
Neutro Abs: 5.2 10*3/uL (ref 1.7–7.7)
Neutrophils Relative %: 77 %
Platelet Count: 209 10*3/uL (ref 150–400)
RBC: 3.76 MIL/uL — ABNORMAL LOW (ref 4.22–5.81)
RDW: 15.9 % — ABNORMAL HIGH (ref 11.5–15.5)
WBC Count: 6.8 10*3/uL (ref 4.0–10.5)
nRBC: 0 % (ref 0.0–0.2)

## 2019-05-22 LAB — CMP (CANCER CENTER ONLY)
ALT: 14 U/L (ref 0–44)
AST: 11 U/L — ABNORMAL LOW (ref 15–41)
Albumin: 3.1 g/dL — ABNORMAL LOW (ref 3.5–5.0)
Alkaline Phosphatase: 57 U/L (ref 38–126)
Anion gap: 9 (ref 5–15)
BUN: 20 mg/dL (ref 8–23)
CO2: 23 mmol/L (ref 22–32)
Calcium: 9.1 mg/dL (ref 8.9–10.3)
Chloride: 108 mmol/L (ref 98–111)
Creatinine: 1.46 mg/dL — ABNORMAL HIGH (ref 0.61–1.24)
GFR, Est AFR Am: 57 mL/min — ABNORMAL LOW (ref >60)
GFR, Estimated: 49 mL/min — ABNORMAL LOW (ref >60)
Glucose, Bld: 93 mg/dL (ref 70–99)
Potassium: 4.6 mmol/L (ref 3.5–5.1)
Sodium: 140 mmol/L (ref 135–145)
Total Bilirubin: 0.4 mg/dL (ref 0.3–1.2)
Total Protein: 6.8 g/dL (ref 6.5–8.1)

## 2019-05-22 MED ORDER — OXYCODONE-ACETAMINOPHEN 5-325 MG PO TABS: 1.0000 | ORAL_TABLET | Freq: Three times a day (TID) | ORAL | 0 refills | Status: DC | PRN

## 2019-05-22 MED ORDER — SODIUM CHLORIDE 0.9 % IV SOLN
45.0000 mg/m2 | Freq: Once | INTRAVENOUS | Status: AC
  Administered 2019-05-22: 96 mg via INTRAVENOUS
  Filled 2019-05-22: qty 16

## 2019-05-22 MED ORDER — SODIUM CHLORIDE 0.9 % IV SOLN
20.0000 mg | Freq: Once | INTRAVENOUS | Status: AC
  Administered 2019-05-22: 20 mg via INTRAVENOUS
  Filled 2019-05-22: qty 2

## 2019-05-22 MED ORDER — DIPHENHYDRAMINE HCL 50 MG/ML IJ SOLN
INTRAMUSCULAR | Status: AC
  Filled 2019-05-22: qty 1

## 2019-05-22 MED ORDER — SODIUM CHLORIDE 0.9 % IV SOLN
Freq: Once | INTRAVENOUS | Status: AC
  Administered 2019-05-22: 12:00:00 via INTRAVENOUS
  Filled 2019-05-22: qty 250

## 2019-05-22 MED ORDER — FAMOTIDINE IN NACL 20-0.9 MG/50ML-% IV SOLN
INTRAVENOUS | Status: AC
  Filled 2019-05-22: qty 50

## 2019-05-22 MED ORDER — PALONOSETRON HCL INJECTION 0.25 MG/5ML
0.2500 mg | Freq: Once | INTRAVENOUS | Status: AC
  Administered 2019-05-22: 0.25 mg via INTRAVENOUS

## 2019-05-22 MED ORDER — PALONOSETRON HCL INJECTION 0.25 MG/5ML
INTRAVENOUS | Status: AC
  Filled 2019-05-22: qty 5

## 2019-05-22 MED ORDER — SODIUM CHLORIDE 0.9 % IV SOLN
162.6000 mg | Freq: Once | INTRAVENOUS | Status: AC
  Administered 2019-05-22: 160 mg via INTRAVENOUS
  Filled 2019-05-22: qty 16

## 2019-05-22 MED ORDER — OXYCODONE-ACETAMINOPHEN 5-325 MG PO TABS
2.0000 | ORAL_TABLET | Freq: Once | ORAL | Status: AC
  Administered 2019-05-22: 2 via ORAL

## 2019-05-22 MED ORDER — OXYCODONE-ACETAMINOPHEN 5-325 MG PO TABS
ORAL_TABLET | ORAL | Status: AC
  Filled 2019-05-22: qty 2

## 2019-05-22 MED ORDER — DIPHENHYDRAMINE HCL 50 MG/ML IJ SOLN
50.0000 mg | Freq: Once | INTRAMUSCULAR | Status: AC
  Administered 2019-05-22: 50 mg via INTRAVENOUS

## 2019-05-22 MED ORDER — FAMOTIDINE IN NACL 20-0.9 MG/50ML-% IV SOLN
20.0000 mg | Freq: Once | INTRAVENOUS | Status: AC
  Administered 2019-05-22: 20 mg via INTRAVENOUS

## 2019-05-22 NOTE — Progress Notes (Signed)
MD: continue Carbo dose at 160 mg.  Kennith Center, Pharm.D., CPP 05/22/2019@12 :29 PM

## 2019-05-22 NOTE — Patient Instructions (Signed)
[facility] Discharge Instructions for Patients Receiving Chemotherapy  Today you received the following chemotherapy agents: Taxol, Carboplatin  To help prevent nausea and vomiting after your treatment, we encourage you to take your nausea medication as directed by your MD.   If you develop nausea and vomiting that is not controlled by your nausea medication, call the clinic.   BELOW ARE SYMPTOMS THAT SHOULD BE REPORTED IMMEDIATELY:  *FEVER GREATER THAN 100.5 F  *CHILLS WITH OR WITHOUT FEVER  NAUSEA AND VOMITING THAT IS NOT CONTROLLED WITH YOUR NAUSEA MEDICATION  *UNUSUAL SHORTNESS OF BREATH  *UNUSUAL BRUISING OR BLEEDING  TENDERNESS IN MOUTH AND THROAT WITH OR WITHOUT PRESENCE OF ULCERS  *URINARY PROBLEMS  *BOWEL PROBLEMS  UNUSUAL RASH Items with * indicate a potential emergency and should be followed up as soon as possible.  Feel free to call the clinic should you have any questions or concerns. The clinic phone number is [phone].  Please show the [place] at check-in to the Emergency Department and triage nurse. Coronavirus (COVID-19) Are you at risk?  Are you at risk for the Coronavirus (COVID-19)?  To be considered HIGH RISK for Coronavirus (COVID-19), you have to meet the following criteria:  . Traveled to [place], [place], [place], [place] or [place]; or in the [place] to [place], [place], [place], or [place]; and have fever, cough, and shortness of breath within the last 2 weeks of travel OR . Been in close contact with a person diagnosed with COVID-19 within the last 2 weeks and have fever, cough, and shortness of breath . IF YOU DO NOT MEET THESE CRITERIA, YOU ARE CONSIDERED LOW RISK FOR COVID-19.  What to do if you are HIGH RISK for COVID-19?  Marland Kitchen If you are having a medical emergency, call 911. . Seek medical care right away. Before you go to a doctor's office, urgent care or emergency department, call ahead and tell  them about your recent travel, contact with someone diagnosed with COVID-19, and your symptoms. You should receive instructions from your physician's office regarding next steps of care.  . When you arrive at healthcare provider, tell the healthcare staff immediately you have returned from visiting [place], [place], [place], [place] or [place]; or traveled in the [place] to [place], [place], [place], or [place]; in the last two weeks or you have been in close contact with a person diagnosed with COVID-19 in the last 2 weeks.   . Tell the health care staff about your symptoms: fever, cough and shortness of breath. . After you have been seen by a medical provider, you will be either: o Tested for (COVID-19) and discharged home on quarantine except to seek medical care if symptoms worsen, and asked to  - Stay home and avoid contact with others until you get your results (4-5 days)  - Avoid travel on public transportation if possible (such as bus, train, or airplane) or o Sent to the Emergency Department by EMS for evaluation, COVID-19 testing, and possible admission depending on your condition and test results.  What to do if you are LOW RISK for COVID-19?  Reduce your risk of any infection by using the same precautions used for avoiding the common cold or flu:  Marland Kitchen Wash your hands often with soap and warm water for at least 20 seconds.  If soap and water are not readily available, use an alcohol-based hand sanitizer with at least 60% alcohol.  . If  coughing or sneezing, cover your mouth and nose by coughing or sneezing into the elbow areas of your shirt or coat, into a tissue or into your sleeve (not your hands). . Avoid shaking hands with others and consider head nods or verbal greetings only. . Avoid touching your eyes, nose, or mouth with unwashed hands.  . Avoid close contact with people who are sick. . Avoid places or events with large numbers of people in one location, like concerts or  sporting events. . Carefully consider travel plans you have or are making. . If you are planning any travel outside or inside the [place], visit the CDC's Travelers' Health webpage for the latest health notices. . If you have some symptoms but not all symptoms, continue to monitor at home and seek medical attention if your symptoms worsen. . If you are having a medical emergency, call 911.   [place] / e-Visit: [email or web address]         [facility]: [facility]: [phone]                   [facility]: [phone]

## 2019-05-22 NOTE — Progress Notes (Signed)
Pt. complained of left chest pain, dull pain that radiates to his back. Vital signs stable, no shortness of breath noted. States pain is dull and kept him up last night and he has had this pain for "awhile". States he is taking Tylenol at home, but feels he needs something stronger. Sandi Mealy notified and new orders received. Pt. took Percocet 2 tabs and states it was effective as pain went away. New home prescription was requested.

## 2019-05-23 ENCOUNTER — Ambulatory Visit
Admission: RE | Admit: 2019-05-23 | Discharge: 2019-05-23 | Disposition: A | Discharge status: Home / Self Care | Payer: Medicare HMO | Source: Ambulatory Visit | Attending: Radiation Oncology | Admitting: Radiation Oncology

## 2019-05-23 ENCOUNTER — Other Ambulatory Visit: Payer: Self-pay

## 2019-05-23 DIAGNOSIS — C3412 Malignant neoplasm of upper lobe, left bronchus or lung: Secondary | ICD-10-CM | POA: Insufficient documentation

## 2019-05-23 DIAGNOSIS — Z510 Encounter for antineoplastic radiation therapy: Secondary | ICD-10-CM | POA: Insufficient documentation

## 2019-05-23 DIAGNOSIS — F17210 Nicotine dependence, cigarettes, uncomplicated: Secondary | ICD-10-CM | POA: Insufficient documentation

## 2019-05-24 ENCOUNTER — Other Ambulatory Visit: Payer: Self-pay

## 2019-05-24 ENCOUNTER — Ambulatory Visit
Admission: RE | Admit: 2019-05-24 | Discharge: 2019-05-24 | Disposition: A | Discharge status: Home / Self Care | Payer: Medicare HMO | Source: Ambulatory Visit | Attending: Radiation Oncology | Admitting: Radiation Oncology

## 2019-05-24 DIAGNOSIS — C3412 Malignant neoplasm of upper lobe, left bronchus or lung: Secondary | ICD-10-CM | POA: Insufficient documentation

## 2019-05-24 DIAGNOSIS — F17210 Nicotine dependence, cigarettes, uncomplicated: Secondary | ICD-10-CM | POA: Diagnosis not present

## 2019-05-24 DIAGNOSIS — Z510 Encounter for antineoplastic radiation therapy: Secondary | ICD-10-CM | POA: Diagnosis not present

## 2019-05-24 NOTE — Progress Notes (Signed)
  Radiation Oncology         [phone]) [phone] ________________________________  Name: Genesis Paget MRN: [id number]  Date: 05/04/2019  DOB: 08/25/1950  RESPIRATORY MOTION MANAGEMENT SIMULATION  NARRATIVE:  In order to account for effect of respiratory motion on target structures and other organs in the planning and delivery of radiotherapy, this patient underwent respiratory motion management simulation.  To accomplish this, when the patient was brought to the CT simulation planning suite, 4D respiratoy motion management CT images were obtained.  The CT images were loaded into the planning software.  Then, using a variety of tools including Cine, MIP, and standard views, the target volume and planning target volumes (PTV) were delineated.  Avoidance structures were contoured.  Treatment planning then occurred.  Dose volume histograms were generated and reviewed for each of the requested structure.  The resulting plan was carefully reviewed and approved today.   ------------------------------------------------  Jodelle Gross, MD, PhD

## 2019-05-24 NOTE — Progress Notes (Signed)
  Radiation Oncology         [phone]) [phone] ________________________________  Name: [name] [name] MRN: [id number]  Date: 05/04/2019  DOB: Apr 27, 1951  SIMULATION AND TREATMENT PLANNING NOTE  DIAGNOSIS:     ICD-10-CM   1. 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  Identity was confirmed.  All relevant records and images related to the planned course of therapy were reviewed.   Written consent to proceed with treatment was confirmed which was freely given after reviewing the details related to the planned course of therapy had been reviewed with the patient.  Then, the patient was set-up in a stable reproducible  supine position for radiation therapy.  CT images were obtained.  Surface markings were placed.    Medically necessary complex treatment device(s) for immobilization:  Vac-lock bag.   The CT images were loaded into the planning software.  Then the target and avoidance structures were contoured.  Treatment planning then occurred.  The radiation prescription was entered and confirmed.  A total of 5 complex treatment devices were fabricated which relate to the designed radiation treatment fields. Additional reduced fields will be used as necessary to improve the dose homogeneity of the plan. Each of these customized fields/ complex treatment devices will be used on a daily basis during the radiation course. I have requested : 3D Simulation  I have requested a DVH of the following structures: target volume, spinal cord, lungs, heart.   The patient will undergo daily image guidance to ensure accurate localization of the target, and adequate minimize dose to the normal surrounding structures in close proximity to the target.  PLAN:  The patient will receive 60 Gy in 30 fractions initially. The patient will then receive a 6 Gy boost for a final dose of 66 Gy.    Special  treatment procedure The patient will also receive concurrent chemotherapy during the treatment. The patient may therefore experience increased toxicity or side effects and the patient will be monitored for such problems. This may require extra lab work as necessary. This therefore constitutes a special treatment procedure.   ________________________________   Jodelle Gross, MD, PhD

## 2019-05-25 ENCOUNTER — Other Ambulatory Visit: Payer: Self-pay

## 2019-05-25 ENCOUNTER — Ambulatory Visit
Admission: RE | Admit: 2019-05-25 | Discharge: 2019-05-25 | Disposition: A | Discharge status: Home / Self Care | Payer: Medicare HMO | Source: Ambulatory Visit | Attending: Radiation Oncology | Admitting: Radiation Oncology

## 2019-05-25 DIAGNOSIS — C3412 Malignant neoplasm of upper lobe, left bronchus or lung: Secondary | ICD-10-CM | POA: Diagnosis not present

## 2019-05-25 DIAGNOSIS — Z510 Encounter for antineoplastic radiation therapy: Secondary | ICD-10-CM | POA: Diagnosis not present

## 2019-05-25 DIAGNOSIS — F17210 Nicotine dependence, cigarettes, uncomplicated: Secondary | ICD-10-CM | POA: Diagnosis not present

## 2019-05-26 ENCOUNTER — Ambulatory Visit
Admission: RE | Admit: 2019-05-26 | Discharge: 2019-05-26 | Disposition: A | Discharge status: Home / Self Care | Payer: Medicare HMO | Source: Ambulatory Visit | Attending: Radiation Oncology | Admitting: Radiation Oncology

## 2019-05-26 ENCOUNTER — Other Ambulatory Visit: Payer: Self-pay

## 2019-05-26 DIAGNOSIS — C3412 Malignant neoplasm of upper lobe, left bronchus or lung: Secondary | ICD-10-CM | POA: Diagnosis not present

## 2019-05-26 DIAGNOSIS — F17210 Nicotine dependence, cigarettes, uncomplicated: Secondary | ICD-10-CM | POA: Diagnosis not present

## 2019-05-26 DIAGNOSIS — Z510 Encounter for antineoplastic radiation therapy: Secondary | ICD-10-CM | POA: Diagnosis not present

## 2019-05-29 ENCOUNTER — Other Ambulatory Visit: Payer: Self-pay

## 2019-05-29 ENCOUNTER — Ambulatory Visit
Admission: RE | Admit: 2019-05-29 | Discharge: 2019-05-29 | Disposition: A | Discharge status: Home / Self Care | Payer: Medicare HMO | Source: Ambulatory Visit | Attending: Radiation Oncology | Admitting: Radiation Oncology

## 2019-05-29 ENCOUNTER — Inpatient Hospital Stay: Payer: Medicare HMO | Attending: Internal Medicine | Admitting: Internal Medicine

## 2019-05-29 ENCOUNTER — Inpatient Hospital Stay: Payer: Medicare HMO

## 2019-05-29 ENCOUNTER — Encounter: Payer: Self-pay | Admitting: Internal Medicine

## 2019-05-29 VITALS — BP 108/68 | HR 99 | Temp 98.2°F | Resp 18 | Ht 67.0 in | Wt 211.4 lb

## 2019-05-29 DIAGNOSIS — E1136 Type 2 diabetes mellitus with diabetic cataract: Secondary | ICD-10-CM | POA: Insufficient documentation

## 2019-05-29 DIAGNOSIS — R110 Nausea: Secondary | ICD-10-CM | POA: Insufficient documentation

## 2019-05-29 DIAGNOSIS — K219 Gastro-esophageal reflux disease without esophagitis: Secondary | ICD-10-CM | POA: Diagnosis not present

## 2019-05-29 DIAGNOSIS — Z951 Presence of aortocoronary bypass graft: Secondary | ICD-10-CM | POA: Diagnosis not present

## 2019-05-29 DIAGNOSIS — Z79899 Other long term (current) drug therapy: Secondary | ICD-10-CM | POA: Insufficient documentation

## 2019-05-29 DIAGNOSIS — Z5111 Encounter for antineoplastic chemotherapy: Secondary | ICD-10-CM

## 2019-05-29 DIAGNOSIS — E785 Hyperlipidemia, unspecified: Secondary | ICD-10-CM | POA: Diagnosis not present

## 2019-05-29 DIAGNOSIS — C3412 Malignant neoplasm of upper lobe, left bronchus or lung: Secondary | ICD-10-CM | POA: Diagnosis not present

## 2019-05-29 DIAGNOSIS — Z7951 Long term (current) use of inhaled steroids: Secondary | ICD-10-CM | POA: Insufficient documentation

## 2019-05-29 DIAGNOSIS — Z791 Long term (current) use of non-steroidal anti-inflammatories (NSAID): Secondary | ICD-10-CM | POA: Diagnosis not present

## 2019-05-29 DIAGNOSIS — C3492 Malignant neoplasm of unspecified part of left bronchus or lung: Secondary | ICD-10-CM

## 2019-05-29 DIAGNOSIS — I5082 Biventricular heart failure: Secondary | ICD-10-CM | POA: Insufficient documentation

## 2019-05-29 DIAGNOSIS — I255 Ischemic cardiomyopathy: Secondary | ICD-10-CM | POA: Diagnosis not present

## 2019-05-29 DIAGNOSIS — Z7982 Long term (current) use of aspirin: Secondary | ICD-10-CM | POA: Diagnosis not present

## 2019-05-29 DIAGNOSIS — Z95810 Presence of automatic (implantable) cardiac defibrillator: Secondary | ICD-10-CM | POA: Insufficient documentation

## 2019-05-29 DIAGNOSIS — F17210 Nicotine dependence, cigarettes, uncomplicated: Secondary | ICD-10-CM | POA: Diagnosis not present

## 2019-05-29 DIAGNOSIS — M109 Gout, unspecified: Secondary | ICD-10-CM | POA: Diagnosis not present

## 2019-05-29 DIAGNOSIS — I5022 Chronic systolic (congestive) heart failure: Secondary | ICD-10-CM | POA: Diagnosis not present

## 2019-05-29 DIAGNOSIS — R072 Precordial pain: Secondary | ICD-10-CM | POA: Diagnosis not present

## 2019-05-29 DIAGNOSIS — I2510 Atherosclerotic heart disease of native coronary artery without angina pectoris: Secondary | ICD-10-CM | POA: Diagnosis not present

## 2019-05-29 DIAGNOSIS — J449 Chronic obstructive pulmonary disease, unspecified: Secondary | ICD-10-CM | POA: Insufficient documentation

## 2019-05-29 DIAGNOSIS — I110 Hypertensive heart disease with heart failure: Secondary | ICD-10-CM | POA: Diagnosis not present

## 2019-05-29 DIAGNOSIS — Z510 Encounter for antineoplastic radiation therapy: Secondary | ICD-10-CM | POA: Diagnosis not present

## 2019-05-29 LAB — CBC WITH DIFFERENTIAL (CANCER CENTER ONLY)
Abs Immature Granulocytes: 0.05 10*3/uL (ref 0.00–0.07)
Basophils Absolute: 0 10*3/uL (ref 0.0–0.1)
Basophils Relative: 0 %
Eosinophils Absolute: 0.1 10*3/uL (ref 0.0–0.5)
Eosinophils Relative: 1 %
HCT: 33.1 % — ABNORMAL LOW (ref 39.0–52.0)
Hemoglobin: 10.4 g/dL — ABNORMAL LOW (ref 13.0–17.0)
Immature Granulocytes: 1 %
Lymphocytes Relative: 11 %
Lymphs Abs: 0.6 10*3/uL — ABNORMAL LOW (ref 0.7–4.0)
MCH: 28 pg (ref 26.0–34.0)
MCHC: 31.4 g/dL (ref 30.0–36.0)
MCV: 89.2 fL (ref 80.0–100.0)
Monocytes Absolute: 0.5 10*3/uL (ref 0.1–1.0)
Monocytes Relative: 9 %
Neutro Abs: 4.3 10*3/uL (ref 1.7–7.7)
Neutrophils Relative %: 78 %
Platelet Count: 185 10*3/uL (ref 150–400)
RBC: 3.71 MIL/uL — ABNORMAL LOW (ref 4.22–5.81)
RDW: 16.3 % — ABNORMAL HIGH (ref 11.5–15.5)
WBC Count: 5.6 10*3/uL (ref 4.0–10.5)
nRBC: 0 % (ref 0.0–0.2)

## 2019-05-29 LAB — CMP (CANCER CENTER ONLY)
ALT: 12 U/L (ref 0–44)
AST: 13 U/L — ABNORMAL LOW (ref 15–41)
Albumin: 3.2 g/dL — ABNORMAL LOW (ref 3.5–5.0)
Alkaline Phosphatase: 57 U/L (ref 38–126)
Anion gap: 9 (ref 5–15)
BUN: 17 mg/dL (ref 8–23)
CO2: 26 mmol/L (ref 22–32)
Calcium: 9.2 mg/dL (ref 8.9–10.3)
Chloride: 105 mmol/L (ref 98–111)
Creatinine: 1.36 mg/dL — ABNORMAL HIGH (ref 0.61–1.24)
GFR, Est AFR Am: >60 mL/min (ref >60)
GFR, Estimated: 53 mL/min — ABNORMAL LOW (ref >60)
Glucose, Bld: 99 mg/dL (ref 70–99)
Potassium: 4.3 mmol/L (ref 3.5–5.1)
Sodium: 140 mmol/L (ref 135–145)
Total Bilirubin: 0.4 mg/dL (ref 0.3–1.2)
Total Protein: 7 g/dL (ref 6.5–8.1)

## 2019-05-29 MED ORDER — SODIUM CHLORIDE 0.9 % IV SOLN
162.6000 mg | Freq: Once | INTRAVENOUS | Status: AC
  Administered 2019-05-29: 160 mg via INTRAVENOUS
  Filled 2019-05-29: qty 16

## 2019-05-29 MED ORDER — DIPHENHYDRAMINE HCL 50 MG/ML IJ SOLN
50.0000 mg | Freq: Once | INTRAMUSCULAR | Status: AC
  Administered 2019-05-29: 50 mg via INTRAVENOUS

## 2019-05-29 MED ORDER — SODIUM CHLORIDE 0.9 % IV SOLN
20.0000 mg | Freq: Once | INTRAVENOUS | Status: AC
  Administered 2019-05-29: 12:00:00 20 mg via INTRAVENOUS
  Filled 2019-05-29: qty 20

## 2019-05-29 MED ORDER — FAMOTIDINE IN NACL 20-0.9 MG/50ML-% IV SOLN
INTRAVENOUS | Status: AC
  Filled 2019-05-29: qty 50

## 2019-05-29 MED ORDER — SODIUM CHLORIDE 0.9% FLUSH
10.0000 mL | INTRAVENOUS | Status: DC | PRN
  Filled 2019-05-29: qty 10

## 2019-05-29 MED ORDER — DIPHENHYDRAMINE HCL 50 MG/ML IJ SOLN
INTRAMUSCULAR | Status: AC
  Filled 2019-05-29: qty 1

## 2019-05-29 MED ORDER — PALONOSETRON HCL INJECTION 0.25 MG/5ML
INTRAVENOUS | Status: AC
  Filled 2019-05-29: qty 5

## 2019-05-29 MED ORDER — FAMOTIDINE IN NACL 20-0.9 MG/50ML-% IV SOLN
20.0000 mg | Freq: Once | INTRAVENOUS | Status: AC
  Administered 2019-05-29: 20 mg via INTRAVENOUS

## 2019-05-29 MED ORDER — HEPARIN SOD (PORK) LOCK FLUSH 100 UNIT/ML IV SOLN
500.0000 [IU] | Freq: Once | INTRAVENOUS | Status: DC | PRN
  Filled 2019-05-29: qty 5

## 2019-05-29 MED ORDER — SODIUM CHLORIDE 0.9 % IV SOLN
45.0000 mg/m2 | Freq: Once | INTRAVENOUS | Status: AC
  Administered 2019-05-29: 96 mg via INTRAVENOUS
  Filled 2019-05-29: qty 16

## 2019-05-29 MED ORDER — PALONOSETRON HCL INJECTION 0.25 MG/5ML
0.2500 mg | Freq: Once | INTRAVENOUS | Status: AC
  Administered 2019-05-29: 0.25 mg via INTRAVENOUS

## 2019-05-29 MED ORDER — OXYCODONE-ACETAMINOPHEN 5-325 MG PO TABS: 1.0000 | ORAL_TABLET | Freq: Three times a day (TID) | ORAL | 0 refills | Status: DC | PRN

## 2019-05-29 MED ORDER — SODIUM CHLORIDE 0.9 % IV SOLN
Freq: Once | INTRAVENOUS | Status: AC
  Administered 2019-05-29: 12:00:00 via INTRAVENOUS
  Filled 2019-05-29: qty 250

## 2019-05-29 NOTE — Patient Instructions (Signed)
[facility] Discharge Instructions for Patients Receiving Chemotherapy  Today you received the following chemotherapy agents Taxol, Carboplatin  To help prevent nausea and vomiting after your treatment, we encourage you to take your nausea medication as directed  If you develop nausea and vomiting that is not controlled by your nausea medication, call the clinic.   BELOW ARE SYMPTOMS THAT SHOULD BE REPORTED IMMEDIATELY:  *FEVER GREATER THAN 100.5 F  *CHILLS WITH OR WITHOUT FEVER  NAUSEA AND VOMITING THAT IS NOT CONTROLLED WITH YOUR NAUSEA MEDICATION  *UNUSUAL SHORTNESS OF BREATH  *UNUSUAL BRUISING OR BLEEDING  TENDERNESS IN MOUTH AND THROAT WITH OR WITHOUT PRESENCE OF ULCERS  *URINARY PROBLEMS  *BOWEL PROBLEMS  UNUSUAL RASH Items with * indicate a potential emergency and should be followed up as soon as possible.  Feel free to call the clinic should you have any questions or concerns. The clinic phone number is [phone].  Please show the CHEMO ALERT CARD at check-in to the Emergency Department and triage nurse.   

## 2019-05-29 NOTE — Progress Notes (Signed)
[place] [phone]   [phone]  OFFICE PROGRESS NOTE  [name], PA-C [place]  [place] [address]  DIAGNOSIS: stage IIIc (T2 a, N3, M0) non-small cell lung cancer, squamous cell carcinoma diagnosed in October 2020 and presented with left upper lobe pleural-based mass in addition to left hilar and right paratracheal lymphadenopathy.  PRIOR THERAPY:None.  CURRENT THERAPY: Concurrent chemoradiation with weekly carboplatin for AUC of 2 and paclitaxel 45 mg/M2.  First dose was on May 08, 2019.  Status post 3 cycles.  INTERVAL HISTORY: [name] [name] 68 y.o. male returns to the clinic today for follow-up visit.  The patient is feeling fine today with no concerning complaints except for mild fatigue and intermittent chest pain.  He denied having any current shortness of breath, cough or hemoptysis.  He denied having any current nausea, vomiting, diarrhea or constipation.  He has no headache or visual changes.  He is tolerating his current treatment with concurrent chemoradiation fairly well.  He is here today for evaluation before starting cycle #4 of his treatment.  MEDICAL HISTORY: Past Medical History:  Diagnosis Date  . AICD (automatic cardioverter/defibrillator) present    reports no shocks  - st Jude  . Ambulates with cane   . CAD (coronary artery disease)    a. 04/2013 Cath: LM 10, LAD 40p, 35m, D1 50p, LCX 60p, 87m/100m, OM1 50, OM2 100 L-L collats, RCA 100p;  b. CABG x 5: LIMA->LAD, VG->D2, VG->RI->OM2, VG->PDA.  . Cataracts, bilateral   . Chronic systolic CHF (congestive heart failure) ([place])    a. 04/2013 Echo: EF 15-20%, diff HK, sev HK of inf and ant myocardium, dilated LA,  b. EF 20-25% with restrictive filling pattern, mod RV dysfx, mild MR (10/10/2013);  c.  Echo 12/15: EF 25-30%  . COPD (chronic obstructive pulmonary disease) ([place])   . Diabetes mellitus without complication ([place])    unsure what type but was dx as an  adult, takes no meds, check his cbg at home 2 times a week   . Emphysema   . GERD (gastroesophageal reflux disease)   . Gout   . Hyperlipidemia   . Hypertension   . Ischemic cardiomyopathy   . PVD (peripheral vascular disease) ([place])    a. (10/2013) ABIs: RIGHT 0.63, Waveforms: monophasic;  LEFT 0.78, Waveforms: monophasic  . Tobacco abuse    30+ pack-year history  . Transaminitis    a. 04/2013 Abd U/S and CT unremarkable, hepatitis panel neg -->felt to be 2/2 acute R heart failure.    ALLERGIES:  is allergic to penicillins.  MEDICATIONS:  Current Outpatient Medications  Medication Sig Dispense Refill  . albuterol (VENTOLIN HFA) 108 (90 Base) MCG/ACT inhaler Inhale 1-2 puffs into the lungs every 6 (six) hours as needed for wheezing or shortness of breath. 6.7 g 6  . allopurinol (ZYLOPRIM) 300 MG tablet Take 300 mg by mouth daily.    Marland Kitchen aspirin 81 MG chewable tablet Chew 1 tablet (81 mg total) by mouth 2 (two) times daily. 60 tablet 1  . atorvastatin (LIPITOR) 20 MG tablet Take 20 mg by mouth daily.    Marland Kitchen azelastine (OPTIVAR) 0.05 % ophthalmic solution Place 1 drop into the left eye 2 (two) times daily.   0  . b complex vitamins tablet Take 1 tablet by mouth daily.    . budesonide-formoterol (SYMBICORT) 160-4.5 MCG/ACT inhaler Inhale 2 puffs into the lungs 2 (two) times daily. 1 Inhaler 6  .  carvedilol (COREG) 6.25 MG tablet TAKE 1 AND 1/2 TABLETS BY MOUTH TWICE A DAY WITH A MEAL 60 tablet 6  . celecoxib (CELEBREX) 200 MG capsule Take 200 mg by mouth 2 (two) times daily as needed for mild pain.    Marland Kitchen colchicine (COLCRYS) 0.6 MG tablet Take 0.6 mg by mouth daily as needed (gout). At on set .012 mg gout and 0.6 mg an hour later. No more that three tablets a day    . docusate sodium (COLACE) 100 MG capsule Take 1 capsule (100 mg total) by mouth 2 (two) times daily. 60 capsule 1  . ENTRESTO 49-51 MG Take 1 tablet by mouth 2 (two) times daily.    . furosemide (LASIX) 40 MG tablet Take 40 mg by  mouth 2 (two) times daily.     . hydrALAZINE (APRESOLINE) 25 MG tablet TAKE 0.5 TABLETS (12.5 MG TOTAL) BY MOUTH 3 (THREE) TIMES DAILY. 45 tablet 3  . HYDROcodone-acetaminophen (NORCO/VICODIN) 5-325 MG tablet Take 1 tablet by mouth every 4 (four) hours as needed. 10 tablet 0  . isosorbide mononitrate (IMDUR) 30 MG 24 hr tablet TAKE 1 TABLET BY MOUTH EVERY DAY 30 tablet 3  . omeprazole (PRILOSEC) 40 MG capsule Take 40 mg by mouth daily.    . ondansetron (ZOFRAN) 4 MG tablet Take 1 tablet (4 mg total) by mouth every 6 (six) hours as needed for nausea. 20 tablet 0  . oxyCODONE-acetaminophen (PERCOCET/ROXICET) 5-325 MG tablet Take 1 tablet by mouth every 8 (eight) hours as needed for severe pain. 20 tablet 0  . Potassium Chloride ER 20 MEQ TBCR Take 20 mEq by mouth daily.     . prochlorperazine (COMPAZINE) 10 MG tablet Take 1 tablet (10 mg total) by mouth every 6 (six) hours as needed for nausea or vomiting. 30 tablet 0  . senna (SENOKOT) 8.6 MG TABS tablet Take 1 tablet (8.6 mg total) by mouth 2 (two) times daily. 120 each 0  . spironolactone (ALDACTONE) 25 MG tablet Take 0.5 tablets (12.5 mg total) by mouth daily. 45 tablet 1  . tiotropium (SPIRIVA) 18 MCG inhalation capsule Place 18 mcg into inhaler and inhale daily.    Marland Kitchen tiZANidine (ZANAFLEX) 4 MG tablet Take 4 mg by mouth every 6 (six) hours as needed for muscle spasms.      No current facility-administered medications for this visit.     SURGICAL HISTORY:  Past Surgical History:  Procedure Laterality Date  . APPENDECTOMY  as a child  . CARDIAC CATHETERIZATION  04/08/2015   Procedure: Left Heart Cath and Cors/Grafts Angiography;  Surgeon: Peter M Martinique, MD;  Location: [facility];  Service: Cardiovascular;;  . COLONOSCOPY    . CORONARY ARTERY BYPASS GRAFT N/A 05/05/2013   Procedure: CORONARY ARTERY BYPASS GRAFTING (CABG) TIMES FIVE USING LEFT INTERNAL MAMMARY ARTERY AND RIGHT AND LEFT SAPHENOUS LEG VEIN HARVESTED ENDOSCOPICALLY;   Surgeon: Melrose Nakayama, MD;  Location: [facility];  Service: Open Heart Surgery;  Laterality: N/A;  . IMPLANTABLE CARDIOVERTER DEFIBRILLATOR IMPLANT N/A 07/06/2014   Procedure: St Jude IMPLANTABLE CARDIOVERTER DEFIBRILLATOR IMPLANT;  Surgeon: Deboraha Sprang, MD;  Location: [facility] CATH [facility];  Service: Cardiovascular;  Laterality: N/A;  . LEFT AND RIGHT HEART CATHETERIZATION WITH CORONARY ANGIOGRAM N/A 04/28/2013   Procedure: LEFT AND RIGHT HEART CATHETERIZATION WITH CORONARY ANGIOGRAM;  Surgeon: Burnell Blanks, MD;  Location: [facility] CATH LAB;  Service: Cardiovascular;  Laterality: N/A;  . MULTIPLE EXTRACTIONS WITH ALVEOLOPLASTY N/A 05/03/2013   Procedure: Extraction of  tooth #s 3,4,5,6,8,9,27 with alveoloplast and maxillary left osseous tuberosity reduction;  Surgeon: Lenn Cal, DDS;  Location: [facility];  Service: Oral Surgery;  Laterality: N/A;  . TOTAL HIP ARTHROPLASTY Right 11/30/2018   Procedure: TOTAL HIP ARTHROPLASTY ANTERIOR APPROACH;  Surgeon: Rod Can, MD;  Location: [facility];  Service: Orthopedics;  Laterality: Right;  Marland Kitchen VIDEO BRONCHOSCOPY WITH ENDOBRONCHIAL ULTRASOUND N/A 04/20/2019   Procedure: VIDEO BRONCHOSCOPY WITH ENDOBRONCHIAL ULTRASOUND;  Surgeon: Margaretha Seeds, MD;  Location: [facility];  Service: Thoracic;  Laterality: N/A;    REVIEW OF SYSTEMS:  A comprehensive review of systems was negative except for: Respiratory: positive for pleurisy/chest pain   PHYSICAL EXAMINATION: General appearance: alert, cooperative and no distress Head: Normocephalic, without obvious abnormality, atraumatic Neck: no adenopathy, no JVD, supple, symmetrical, trachea midline and thyroid not enlarged, symmetric, no tenderness/mass/nodules Lymph nodes: Cervical, supraclavicular, and axillary nodes normal. Resp: clear to auscultation bilaterally Back: symmetric, no curvature. ROM normal. No CVA tenderness. Cardio: regular rate and rhythm, S1, S2 normal, no murmur, click, rub or gallop GI: soft,  non-tender; bowel sounds normal; no masses,  no organomegaly Extremities: extremities normal, atraumatic, no cyanosis or edema  ECOG PERFORMANCE STATUS: 1 - Symptomatic but completely ambulatory  Blood pressure 108/68, pulse 99, temperature 98.2 F (36.8 C), temperature source Oral, resp. rate 18, height 5\' 7"  (1.702 m), weight 211 lb 6.4 oz (95.9 kg), SpO2 100 %.  LABORATORY DATA: Lab Results  Component Value Date   WBC 5.6 05/29/2019   HGB 10.4 (L) 05/29/2019   HCT 33.1 (L) 05/29/2019   MCV 89.2 05/29/2019   PLT 185 05/29/2019      Chemistry      Component Value Date/Time   NA 140 05/29/2019 1011   NA 141 08/31/2017 1433   K 4.3 05/29/2019 1011   CL 105 05/29/2019 1011   CO2 26 05/29/2019 1011   BUN 17 05/29/2019 1011   BUN 33 (H) 08/31/2017 1433   CREATININE 1.36 (H) 05/29/2019 1011   CREATININE 1.35 (H) 12/25/2015 1310      Component Value Date/Time   CALCIUM 9.2 05/29/2019 1011   ALKPHOS 57 05/29/2019 1011   AST 13 (L) 05/29/2019 1011   ALT 12 05/29/2019 1011   BILITOT 0.4 05/29/2019 1011       RADIOGRAPHIC STUDIES: No results found.  ASSESSMENT AND PLAN: This is a very pleasant 68 years old male with stage IIIc non-small cell lung cancer, squamous cell carcinoma presented with left upper lobe pleural-based mass in addition to left hilar and right paramediastinal lymphadenopathy. The patient is currently undergoing a course of concurrent chemoradiation with weekly carboplatin and paclitaxel status post 3 cycles.   The patient continues to tolerate this treatment well with no concerning adverse effects. I recommended for him to proceed with cycle #4 today as planned. I will see him back for follow-up visit in 2 weeks for evaluation before starting cycle #6. The patient was advised to call immediately if he has any concerning symptoms in the interval. The patient voices understanding of current disease status and treatment options and is in agreement with the  current care plan.  All questions were answered. The patient knows to call the clinic with any problems, questions or concerns. We can certainly see the patient much sooner if necessary.  I spent 10 minutes counseling the patient face to face. The total time spent in the appointment was 15 minutes.  Disclaimer: This note was dictated with voice recognition software. Similar sounding  words can inadvertently be transcribed and may not be corrected upon review.

## 2019-05-30 ENCOUNTER — Ambulatory Visit
Admission: RE | Admit: 2019-05-30 | Discharge: 2019-05-30 | Disposition: A | Discharge status: Home / Self Care | Payer: Medicare HMO | Source: Ambulatory Visit | Attending: Radiation Oncology | Admitting: Radiation Oncology

## 2019-05-30 ENCOUNTER — Other Ambulatory Visit: Payer: Self-pay

## 2019-05-30 DIAGNOSIS — C3412 Malignant neoplasm of upper lobe, left bronchus or lung: Secondary | ICD-10-CM | POA: Diagnosis not present

## 2019-05-30 DIAGNOSIS — F17210 Nicotine dependence, cigarettes, uncomplicated: Secondary | ICD-10-CM | POA: Diagnosis not present

## 2019-05-30 DIAGNOSIS — Z510 Encounter for antineoplastic radiation therapy: Secondary | ICD-10-CM | POA: Diagnosis not present

## 2019-05-31 ENCOUNTER — Telehealth: Payer: Self-pay | Admitting: Internal Medicine

## 2019-05-31 ENCOUNTER — Ambulatory Visit
Admission: RE | Admit: 2019-05-31 | Discharge: 2019-05-31 | Disposition: A | Discharge status: Home / Self Care | Payer: Medicare HMO | Source: Ambulatory Visit | Attending: Radiation Oncology | Admitting: Radiation Oncology

## 2019-05-31 ENCOUNTER — Other Ambulatory Visit: Payer: Self-pay

## 2019-05-31 DIAGNOSIS — C3412 Malignant neoplasm of upper lobe, left bronchus or lung: Secondary | ICD-10-CM | POA: Diagnosis not present

## 2019-05-31 DIAGNOSIS — F17210 Nicotine dependence, cigarettes, uncomplicated: Secondary | ICD-10-CM | POA: Diagnosis not present

## 2019-05-31 DIAGNOSIS — Z510 Encounter for antineoplastic radiation therapy: Secondary | ICD-10-CM | POA: Diagnosis not present

## 2019-05-31 NOTE — Telephone Encounter (Signed)
Confirmed December appointments with patient. Message to [place] in transportation re rides for patient.

## 2019-06-01 ENCOUNTER — Ambulatory Visit: Payer: Medicare HMO | Admitting: Sports Medicine

## 2019-06-01 ENCOUNTER — Other Ambulatory Visit: Payer: Self-pay

## 2019-06-01 ENCOUNTER — Ambulatory Visit
Admission: RE | Admit: 2019-06-01 | Discharge: 2019-06-01 | Disposition: A | Discharge status: Home / Self Care | Payer: Medicare HMO | Source: Ambulatory Visit | Attending: Radiation Oncology | Admitting: Radiation Oncology

## 2019-06-01 DIAGNOSIS — Z510 Encounter for antineoplastic radiation therapy: Secondary | ICD-10-CM | POA: Diagnosis not present

## 2019-06-01 DIAGNOSIS — C3412 Malignant neoplasm of upper lobe, left bronchus or lung: Secondary | ICD-10-CM | POA: Diagnosis not present

## 2019-06-01 DIAGNOSIS — F17210 Nicotine dependence, cigarettes, uncomplicated: Secondary | ICD-10-CM | POA: Diagnosis not present

## 2019-06-02 ENCOUNTER — Other Ambulatory Visit: Payer: Self-pay

## 2019-06-02 ENCOUNTER — Ambulatory Visit
Admission: RE | Admit: 2019-06-02 | Discharge: 2019-06-02 | Disposition: A | Discharge status: Home / Self Care | Payer: Medicare HMO | Source: Ambulatory Visit | Attending: Radiation Oncology | Admitting: Radiation Oncology

## 2019-06-02 DIAGNOSIS — F17210 Nicotine dependence, cigarettes, uncomplicated: Secondary | ICD-10-CM | POA: Diagnosis not present

## 2019-06-02 DIAGNOSIS — Z510 Encounter for antineoplastic radiation therapy: Secondary | ICD-10-CM | POA: Diagnosis not present

## 2019-06-02 DIAGNOSIS — C3412 Malignant neoplasm of upper lobe, left bronchus or lung: Secondary | ICD-10-CM | POA: Diagnosis not present

## 2019-06-05 ENCOUNTER — Inpatient Hospital Stay: Payer: Medicare HMO

## 2019-06-05 ENCOUNTER — Other Ambulatory Visit: Payer: Self-pay

## 2019-06-05 ENCOUNTER — Ambulatory Visit
Admission: RE | Admit: 2019-06-05 | Discharge: 2019-06-05 | Disposition: A | Discharge status: Home / Self Care | Payer: Medicare HMO | Source: Ambulatory Visit | Attending: Radiation Oncology | Admitting: Radiation Oncology

## 2019-06-05 VITALS — BP 108/64 | HR 99 | Temp 98.3°F | Resp 18

## 2019-06-05 DIAGNOSIS — C3492 Malignant neoplasm of unspecified part of left bronchus or lung: Secondary | ICD-10-CM

## 2019-06-05 DIAGNOSIS — R072 Precordial pain: Secondary | ICD-10-CM | POA: Diagnosis not present

## 2019-06-05 DIAGNOSIS — J449 Chronic obstructive pulmonary disease, unspecified: Secondary | ICD-10-CM | POA: Diagnosis not present

## 2019-06-05 DIAGNOSIS — E7800 Pure hypercholesterolemia, unspecified: Secondary | ICD-10-CM | POA: Diagnosis not present

## 2019-06-05 DIAGNOSIS — F17210 Nicotine dependence, cigarettes, uncomplicated: Secondary | ICD-10-CM | POA: Diagnosis not present

## 2019-06-05 DIAGNOSIS — Z510 Encounter for antineoplastic radiation therapy: Secondary | ICD-10-CM | POA: Diagnosis not present

## 2019-06-05 DIAGNOSIS — N1831 Chronic kidney disease, stage 3a: Secondary | ICD-10-CM | POA: Diagnosis not present

## 2019-06-05 DIAGNOSIS — E1151 Type 2 diabetes mellitus with diabetic peripheral angiopathy without gangrene: Secondary | ICD-10-CM | POA: Diagnosis not present

## 2019-06-05 DIAGNOSIS — I5022 Chronic systolic (congestive) heart failure: Secondary | ICD-10-CM | POA: Diagnosis not present

## 2019-06-05 DIAGNOSIS — I2510 Atherosclerotic heart disease of native coronary artery without angina pectoris: Secondary | ICD-10-CM | POA: Diagnosis not present

## 2019-06-05 DIAGNOSIS — R110 Nausea: Secondary | ICD-10-CM | POA: Diagnosis not present

## 2019-06-05 DIAGNOSIS — M109 Gout, unspecified: Secondary | ICD-10-CM | POA: Diagnosis not present

## 2019-06-05 DIAGNOSIS — Z951 Presence of aortocoronary bypass graft: Secondary | ICD-10-CM | POA: Diagnosis not present

## 2019-06-05 DIAGNOSIS — I130 Hypertensive heart and chronic kidney disease with heart failure and stage 1 through stage 4 chronic kidney disease, or unspecified chronic kidney disease: Secondary | ICD-10-CM | POA: Diagnosis not present

## 2019-06-05 DIAGNOSIS — E1140 Type 2 diabetes mellitus with diabetic neuropathy, unspecified: Secondary | ICD-10-CM | POA: Diagnosis not present

## 2019-06-05 DIAGNOSIS — I509 Heart failure, unspecified: Secondary | ICD-10-CM | POA: Diagnosis not present

## 2019-06-05 DIAGNOSIS — I110 Hypertensive heart disease with heart failure: Secondary | ICD-10-CM | POA: Diagnosis not present

## 2019-06-05 DIAGNOSIS — C3412 Malignant neoplasm of upper lobe, left bronchus or lung: Secondary | ICD-10-CM | POA: Diagnosis not present

## 2019-06-05 DIAGNOSIS — Z5111 Encounter for antineoplastic chemotherapy: Secondary | ICD-10-CM | POA: Diagnosis not present

## 2019-06-05 LAB — CMP (CANCER CENTER ONLY)
ALT: 11 U/L (ref 0–44)
AST: 13 U/L — ABNORMAL LOW (ref 15–41)
Albumin: 3.4 g/dL — ABNORMAL LOW (ref 3.5–5.0)
Alkaline Phosphatase: 55 U/L (ref 38–126)
Anion gap: 8 (ref 5–15)
BUN: 18 mg/dL (ref 8–23)
CO2: 22 mmol/L (ref 22–32)
Calcium: 8.6 mg/dL — ABNORMAL LOW (ref 8.9–10.3)
Chloride: 109 mmol/L (ref 98–111)
Creatinine: 1.52 mg/dL — ABNORMAL HIGH (ref 0.61–1.24)
GFR, Est AFR Am: 54 mL/min — ABNORMAL LOW (ref >60)
GFR, Estimated: 47 mL/min — ABNORMAL LOW (ref >60)
Glucose, Bld: 105 mg/dL — ABNORMAL HIGH (ref 70–99)
Potassium: 4.3 mmol/L (ref 3.5–5.1)
Sodium: 139 mmol/L (ref 135–145)
Total Bilirubin: 0.5 mg/dL (ref 0.3–1.2)
Total Protein: 6.8 g/dL (ref 6.5–8.1)

## 2019-06-05 LAB — CBC WITH DIFFERENTIAL (CANCER CENTER ONLY)
Abs Immature Granulocytes: 0.02 10*3/uL (ref 0.00–0.07)
Basophils Absolute: 0 10*3/uL (ref 0.0–0.1)
Basophils Relative: 0 %
Eosinophils Absolute: 0.1 10*3/uL (ref 0.0–0.5)
Eosinophils Relative: 1 %
HCT: 32.8 % — ABNORMAL LOW (ref 39.0–52.0)
Hemoglobin: 10.2 g/dL — ABNORMAL LOW (ref 13.0–17.0)
Immature Granulocytes: 0 %
Lymphocytes Relative: 9 %
Lymphs Abs: 0.4 10*3/uL — ABNORMAL LOW (ref 0.7–4.0)
MCH: 27.9 pg (ref 26.0–34.0)
MCHC: 31.1 g/dL (ref 30.0–36.0)
MCV: 89.6 fL (ref 80.0–100.0)
Monocytes Absolute: 0.3 10*3/uL (ref 0.1–1.0)
Monocytes Relative: 7 %
Neutro Abs: 3.8 10*3/uL (ref 1.7–7.7)
Neutrophils Relative %: 83 %
Platelet Count: 128 10*3/uL — ABNORMAL LOW (ref 150–400)
RBC: 3.66 MIL/uL — ABNORMAL LOW (ref 4.22–5.81)
RDW: 17.1 % — ABNORMAL HIGH (ref 11.5–15.5)
WBC Count: 4.6 10*3/uL (ref 4.0–10.5)
nRBC: 0 % (ref 0.0–0.2)

## 2019-06-05 MED ORDER — SODIUM CHLORIDE 0.9 % IV SOLN
Freq: Once | INTRAVENOUS | Status: AC
  Administered 2019-06-05: 13:00:00 via INTRAVENOUS
  Filled 2019-06-05: qty 250

## 2019-06-05 MED ORDER — SODIUM CHLORIDE 0.9 % IV SOLN
20.0000 mg | Freq: Once | INTRAVENOUS | Status: AC
  Administered 2019-06-05: 20 mg via INTRAVENOUS
  Filled 2019-06-05: qty 20

## 2019-06-05 MED ORDER — FAMOTIDINE IN NACL 20-0.9 MG/50ML-% IV SOLN
20.0000 mg | Freq: Once | INTRAVENOUS | Status: AC
  Administered 2019-06-05: 20 mg via INTRAVENOUS

## 2019-06-05 MED ORDER — DIPHENHYDRAMINE HCL 50 MG/ML IJ SOLN
INTRAMUSCULAR | Status: AC
  Filled 2019-06-05: qty 1

## 2019-06-05 MED ORDER — SODIUM CHLORIDE 0.9 % IV SOLN
45.0000 mg/m2 | Freq: Once | INTRAVENOUS | Status: AC
  Administered 2019-06-05: 96 mg via INTRAVENOUS
  Filled 2019-06-05: qty 16

## 2019-06-05 MED ORDER — SODIUM CHLORIDE 0.9 % IV SOLN
162.6000 mg | Freq: Once | INTRAVENOUS | Status: AC
  Administered 2019-06-05: 15:00:00 160 mg via INTRAVENOUS
  Filled 2019-06-05: qty 16

## 2019-06-05 MED ORDER — DIPHENHYDRAMINE HCL 50 MG/ML IJ SOLN
50.0000 mg | Freq: Once | INTRAMUSCULAR | Status: AC
  Administered 2019-06-05: 50 mg via INTRAVENOUS

## 2019-06-05 MED ORDER — PALONOSETRON HCL INJECTION 0.25 MG/5ML
INTRAVENOUS | Status: AC
  Filled 2019-06-05: qty 5

## 2019-06-05 MED ORDER — FAMOTIDINE IN NACL 20-0.9 MG/50ML-% IV SOLN
INTRAVENOUS | Status: AC
  Filled 2019-06-05: qty 50

## 2019-06-05 MED ORDER — PALONOSETRON HCL INJECTION 0.25 MG/5ML
0.2500 mg | Freq: Once | INTRAVENOUS | Status: AC
  Administered 2019-06-05: 0.25 mg via INTRAVENOUS

## 2019-06-05 NOTE — Progress Notes (Signed)
Per Dr. Julien [name] ok to treat with creatinine of 1.52.

## 2019-06-05 NOTE — Patient Instructions (Signed)
[facility] Discharge Instructions for Patients Receiving Chemotherapy  Today you received the following chemotherapy agents Taxol, Carboplatin  To help prevent nausea and vomiting after your treatment, we encourage you to take your nausea medication as directed  If you develop nausea and vomiting that is not controlled by your nausea medication, call the clinic.   BELOW ARE SYMPTOMS THAT SHOULD BE REPORTED IMMEDIATELY:  *FEVER GREATER THAN 100.5 F  *CHILLS WITH OR WITHOUT FEVER  NAUSEA AND VOMITING THAT IS NOT CONTROLLED WITH YOUR NAUSEA MEDICATION  *UNUSUAL SHORTNESS OF BREATH  *UNUSUAL BRUISING OR BLEEDING  TENDERNESS IN MOUTH AND THROAT WITH OR WITHOUT PRESENCE OF ULCERS  *URINARY PROBLEMS  *BOWEL PROBLEMS  UNUSUAL RASH Items with * indicate a potential emergency and should be followed up as soon as possible.  Feel free to call the clinic should you have any questions or concerns. The clinic phone number is [phone].  Please show the CHEMO ALERT CARD at check-in to the Emergency Department and triage nurse.   

## 2019-06-06 ENCOUNTER — Ambulatory Visit
Admission: RE | Admit: 2019-06-06 | Discharge: 2019-06-06 | Disposition: A | Discharge status: Home / Self Care | Payer: Medicare HMO | Source: Ambulatory Visit | Attending: Radiation Oncology | Admitting: Radiation Oncology

## 2019-06-06 ENCOUNTER — Other Ambulatory Visit: Payer: Self-pay

## 2019-06-06 ENCOUNTER — Encounter (HOSPITAL_COMMUNITY): Payer: Medicare HMO

## 2019-06-06 DIAGNOSIS — C3412 Malignant neoplasm of upper lobe, left bronchus or lung: Secondary | ICD-10-CM | POA: Diagnosis not present

## 2019-06-06 DIAGNOSIS — Z510 Encounter for antineoplastic radiation therapy: Secondary | ICD-10-CM | POA: Diagnosis not present

## 2019-06-06 DIAGNOSIS — F17210 Nicotine dependence, cigarettes, uncomplicated: Secondary | ICD-10-CM | POA: Diagnosis not present

## 2019-06-07 ENCOUNTER — Ambulatory Visit
Admission: RE | Admit: 2019-06-07 | Discharge: 2019-06-07 | Disposition: A | Discharge status: Home / Self Care | Payer: Medicare HMO | Source: Ambulatory Visit | Attending: Radiation Oncology | Admitting: Radiation Oncology

## 2019-06-07 ENCOUNTER — Other Ambulatory Visit: Payer: Self-pay

## 2019-06-07 DIAGNOSIS — R32 Unspecified urinary incontinence: Secondary | ICD-10-CM | POA: Diagnosis not present

## 2019-06-07 DIAGNOSIS — Z510 Encounter for antineoplastic radiation therapy: Secondary | ICD-10-CM | POA: Diagnosis not present

## 2019-06-07 DIAGNOSIS — C3412 Malignant neoplasm of upper lobe, left bronchus or lung: Secondary | ICD-10-CM | POA: Diagnosis not present

## 2019-06-07 DIAGNOSIS — F17210 Nicotine dependence, cigarettes, uncomplicated: Secondary | ICD-10-CM | POA: Diagnosis not present

## 2019-06-08 ENCOUNTER — Other Ambulatory Visit: Payer: Self-pay

## 2019-06-08 ENCOUNTER — Ambulatory Visit
Admission: RE | Admit: 2019-06-08 | Discharge: 2019-06-08 | Disposition: A | Discharge status: Home / Self Care | Payer: Medicare HMO | Source: Ambulatory Visit | Attending: Radiation Oncology | Admitting: Radiation Oncology

## 2019-06-08 DIAGNOSIS — Z510 Encounter for antineoplastic radiation therapy: Secondary | ICD-10-CM | POA: Diagnosis not present

## 2019-06-08 DIAGNOSIS — F17210 Nicotine dependence, cigarettes, uncomplicated: Secondary | ICD-10-CM | POA: Diagnosis not present

## 2019-06-08 DIAGNOSIS — C3412 Malignant neoplasm of upper lobe, left bronchus or lung: Secondary | ICD-10-CM | POA: Diagnosis not present

## 2019-06-09 ENCOUNTER — Other Ambulatory Visit: Payer: Self-pay

## 2019-06-09 ENCOUNTER — Ambulatory Visit
Admission: RE | Admit: 2019-06-09 | Discharge: 2019-06-09 | Disposition: A | Discharge status: Home / Self Care | Payer: Medicare HMO | Source: Ambulatory Visit | Attending: Radiation Oncology | Admitting: Radiation Oncology

## 2019-06-09 DIAGNOSIS — C3412 Malignant neoplasm of upper lobe, left bronchus or lung: Secondary | ICD-10-CM | POA: Diagnosis not present

## 2019-06-09 DIAGNOSIS — F17210 Nicotine dependence, cigarettes, uncomplicated: Secondary | ICD-10-CM | POA: Diagnosis not present

## 2019-06-09 DIAGNOSIS — Z510 Encounter for antineoplastic radiation therapy: Secondary | ICD-10-CM | POA: Diagnosis not present

## 2019-06-11 NOTE — Progress Notes (Signed)
[facility] OFFICE PROGRESS NOTE  [name], PA-C [address] [place] [address]  DIAGNOSIS: stage IIIc (T2 a, N3, M0) non-small cell lung cancer, squamous cell carcinoma diagnosed in October 2020 and presented with left upper lobe pleural-based mass in addition to left hilar and right paratracheal lymphadenopathy.  PRIOR THERAPY: None  CURRENT THERAPY: Concurrent chemoradiation with weekly carboplatin for AUC of 2 and paclitaxel 45 mg/M2.  First dose was on May 08, 2019.  Status post 5 cycles.  INTERVAL HISTORY: [name] [name] 68 y.o. male returns to the clinic for a follow up visit. The patient is feeling fair today. He is a poor historian and history is somewhat limited. The patient continues to tolerate treatment with chemotherapy well without any adverse effects except for mild nausea which is controlled with his antiemetic. Denies any fever, chills, or night sweats. He has lost a few pounds since his last visit. He is scheduled to see the nutritionist while in infusion today. He denies any odynophagia but reports reflux which affects his desire to eat. He denies this being related to radiotherapy. He continues to endorse chest discomfort for which he has a prescription for pain medication which reportedly helps. He localizes his pain to his mid chest/sternal region. He does not have any pain during his encounter today.  He reports occasional cough and shortness of breath for which he uses his prescribed inhalers. Denies hemoptysis. He reports occasional nausea without vomiting. Denies significant diarrhea, or constipation. Denies any headache or visual changes. His last radiation treatment is scheduled for 06/27/2019. The patient is here today for evaluation prior to starting cycle # 6  MEDICAL HISTORY: Past Medical History:  Diagnosis Date  . AICD (automatic cardioverter/defibrillator) present    reports no shocks  - st Jude  . Ambulates with cane   . CAD  (coronary artery disease)    a. 04/2013 Cath: LM 10, LAD 40p, 55m, D1 50p, LCX 60p, 17m/100m, OM1 50, OM2 100 L-L collats, RCA 100p;  b. CABG x 5: LIMA->LAD, VG->D2, VG->RI->OM2, VG->PDA.  . Cataracts, bilateral   . Chronic systolic CHF (congestive heart failure) ([place])    a. 04/2013 Echo: EF 15-20%, diff HK, sev HK of inf and ant myocardium, dilated LA,  b. EF 20-25% with restrictive filling pattern, mod RV dysfx, mild MR (10/10/2013);  c.  Echo 12/15: EF 25-30%  . COPD (chronic obstructive pulmonary disease) ([place])   . Diabetes mellitus without complication ([facility])    unsure what type but was dx as an adult, takes no meds, check his cbg at home 2 times a week   . Emphysema   . GERD (gastroesophageal reflux disease)   . Gout   . Hyperlipidemia   . Hypertension   . Ischemic cardiomyopathy   . PVD (peripheral vascular disease) ([place])    a. (10/2013) ABIs: RIGHT 0.63, Waveforms: monophasic;  LEFT 0.78, Waveforms: monophasic  . Tobacco abuse    30+ pack-year history  . Transaminitis    a. 04/2013 Abd U/S and CT unremarkable, hepatitis panel neg -->felt to be 2/2 acute R heart failure.    ALLERGIES:  is allergic to penicillins.  MEDICATIONS:  Current Outpatient Medications  Medication Sig Dispense Refill  . albuterol (VENTOLIN HFA) 108 (90 Base) MCG/ACT inhaler Inhale 1-2 puffs into the lungs every 6 (six) hours as needed for wheezing or shortness of breath. 6.7 g 6  . allopurinol (ZYLOPRIM) 300 MG tablet Take 300 mg by mouth daily.    Marland Kitchen  aspirin 81 MG chewable tablet Chew 1 tablet (81 mg total) by mouth 2 (two) times daily. 60 tablet 1  . atorvastatin (LIPITOR) 20 MG tablet Take 20 mg by mouth daily.    Marland Kitchen azelastine (OPTIVAR) 0.05 % ophthalmic solution Place 1 drop into the left eye 2 (two) times daily.   0  . b complex vitamins tablet Take 1 tablet by mouth daily.    . budesonide-formoterol (SYMBICORT) 160-4.5 MCG/ACT inhaler Inhale 2 puffs into the lungs 2 (two) times daily. 1 Inhaler 6  .  carvedilol (COREG) 6.25 MG tablet TAKE 1 AND 1/2 TABLETS BY MOUTH TWICE A DAY WITH A MEAL 60 tablet 6  . celecoxib (CELEBREX) 200 MG capsule Take 200 mg by mouth 2 (two) times daily as needed for mild pain.    Marland Kitchen colchicine (COLCRYS) 0.6 MG tablet Take 0.6 mg by mouth daily as needed (gout). At on set .012 mg gout and 0.6 mg an hour later. No more that three tablets a day    . docusate sodium (COLACE) 100 MG capsule Take 1 capsule (100 mg total) by mouth 2 (two) times daily. 60 capsule 1  . ENTRESTO 49-51 MG Take 1 tablet by mouth 2 (two) times daily.    . furosemide (LASIX) 40 MG tablet Take 40 mg by mouth 2 (two) times daily.     . hydrALAZINE (APRESOLINE) 25 MG tablet TAKE 0.5 TABLETS (12.5 MG TOTAL) BY MOUTH 3 (THREE) TIMES DAILY. 45 tablet 3  . HYDROcodone-acetaminophen (NORCO/VICODIN) 5-325 MG tablet Take 1 tablet by mouth every 4 (four) hours as needed. 10 tablet 0  . isosorbide mononitrate (IMDUR) 30 MG 24 hr tablet TAKE 1 TABLET BY MOUTH EVERY DAY 30 tablet 3  . omeprazole (PRILOSEC) 40 MG capsule Take 40 mg by mouth daily.    . ondansetron (ZOFRAN) 4 MG tablet Take 1 tablet (4 mg total) by mouth every 6 (six) hours as needed for nausea. 20 tablet 0  . oxyCODONE-acetaminophen (PERCOCET/ROXICET) 5-325 MG tablet Take 1 tablet by mouth every 8 (eight) hours as needed for severe pain. 20 tablet 0  . Potassium Chloride ER 20 MEQ TBCR Take 20 mEq by mouth daily.     . prochlorperazine (COMPAZINE) 10 MG tablet Take 1 tablet (10 mg total) by mouth every 6 (six) hours as needed for nausea or vomiting. 30 tablet 0  . senna (SENOKOT) 8.6 MG TABS tablet Take 1 tablet (8.6 mg total) by mouth 2 (two) times daily. 120 each 0  . spironolactone (ALDACTONE) 25 MG tablet Take 0.5 tablets (12.5 mg total) by mouth daily. 45 tablet 1  . tiotropium (SPIRIVA) 18 MCG inhalation capsule Place 18 mcg into inhaler and inhale daily.    Marland Kitchen tiZANidine (ZANAFLEX) 4 MG tablet Take 4 mg by mouth every 6 (six) hours as needed  for muscle spasms.      No current facility-administered medications for this visit.    SURGICAL HISTORY:  Past Surgical History:  Procedure Laterality Date  . APPENDECTOMY  as a child  . CARDIAC CATHETERIZATION  04/08/2015   Procedure: Left Heart Cath and Cors/Grafts Angiography;  Surgeon: Peter M Martinique, MD;  Location: [facility];  Service: Cardiovascular;;  . COLONOSCOPY    . CORONARY ARTERY BYPASS GRAFT N/A 05/05/2013   Procedure: CORONARY ARTERY BYPASS GRAFTING (CABG) TIMES FIVE USING LEFT INTERNAL MAMMARY ARTERY AND RIGHT AND LEFT SAPHENOUS LEG VEIN HARVESTED ENDOSCOPICALLY;  Surgeon: Melrose Nakayama, MD;  Location: [facility];  Service: Open  Heart Surgery;  Laterality: N/A;  . IMPLANTABLE CARDIOVERTER DEFIBRILLATOR IMPLANT N/A 07/06/2014   Procedure: St Jude IMPLANTABLE CARDIOVERTER DEFIBRILLATOR IMPLANT;  Surgeon: Deboraha Sprang, MD;  Location: [facility] CATH LAB;  Service: Cardiovascular;  Laterality: N/A;  . LEFT AND RIGHT HEART CATHETERIZATION WITH CORONARY ANGIOGRAM N/A 04/28/2013   Procedure: LEFT AND RIGHT HEART CATHETERIZATION WITH CORONARY ANGIOGRAM;  Surgeon: Burnell Blanks, MD;  Location: [facility] CATH LAB;  Service: Cardiovascular;  Laterality: N/A;  . MULTIPLE EXTRACTIONS WITH ALVEOLOPLASTY N/A 05/03/2013   Procedure: Extraction of tooth #s 3,4,5,6,8,9,27 with alveoloplast and maxillary left osseous tuberosity reduction;  Surgeon: Lenn Cal, DDS;  Location: [facility];  Service: Oral Surgery;  Laterality: N/A;  . TOTAL HIP ARTHROPLASTY Right 11/30/2018   Procedure: TOTAL HIP ARTHROPLASTY ANTERIOR APPROACH;  Surgeon: Rod Can, MD;  Location: [facility];  Service: Orthopedics;  Laterality: Right;  Marland Kitchen VIDEO BRONCHOSCOPY WITH ENDOBRONCHIAL ULTRASOUND N/A 04/20/2019   Procedure: VIDEO BRONCHOSCOPY WITH ENDOBRONCHIAL ULTRASOUND;  Surgeon: Margaretha Seeds, MD;  Location: [facility];  Service: Thoracic;  Laterality: N/A;    REVIEW OF SYSTEMS:   Review of Systems   Constitutional: Positive for appetite change secondary to "heart burn". Negative for chills, fever and unexpected weight change.  HENT: Positive for reflux. Negative for mouth sores, nosebleeds, sore throat and trouble swallowing.   Eyes: Negative for eye problems and icterus.  Respiratory: Positive for occasional cough and shortness of breath.  Negative for hemoptysis and wheezing.   Cardiovascular: Positive for chest discomfort. Negative for leg swelling.  Gastrointestinal: Positive for nausea. Negative for abdominal pain, constipation, diarrhea, and vomiting.  Genitourinary: Negative for bladder incontinence, difficulty urinating, dysuria, frequency and hematuria.   Musculoskeletal: Negative for back pain, gait problem, neck pain and neck stiffness.  Skin: Negative for itching and rash.  Neurological: Negative for dizziness, extremity weakness, gait problem, headaches, light-headedness and seizures.  Hematological: Negative for adenopathy. Does not bruise/bleed easily.  Psychiatric/Behavioral: Negative for confusion, depression and sleep disturbance. The patient is not nervous/anxious.     PHYSICAL EXAMINATION:  Blood pressure 134/87, pulse (!) 106, temperature 98 F (36.7 C), temperature source Temporal, resp. rate 20, height 5\' 7"  (1.702 m), weight 202 lb 12.8 oz (92 kg), SpO2 100 %.  ECOG PERFORMANCE STATUS: 1 - Symptomatic but completely ambulatory  Physical Exam  Constitutional: Oriented to person, place, and time and well-developed, well-nourished, and in no distress.  HENT:  Head: Normocephalic and atraumatic.  Mouth/Throat: Oropharynx is clear and moist. No oropharyngeal exudate.  Eyes: Conjunctivae are normal. Right eye exhibits no discharge. Left eye exhibits no discharge. No scleral icterus.  Neck: Normal range of motion. Neck supple.  Cardiovascular: Normal rate, regular rhythm, normal heart sounds and intact distal pulses.   Pulmonary/Chest: Effort normal. Positive for  rhonchi. No respiratory distress. No rales.  Abdominal: Soft. Bowel sounds are normal. Exhibits no distension and no mass. There is no tenderness.  Musculoskeletal: Normal range of motion. Exhibits no edema.  Lymphadenopathy:    No cervical adenopathy.  Neurological: Alert and oriented to person, place, and time. Exhibits normal muscle tone. Gait normal. Coordination normal.  Skin: Skin is warm and dry. No rash noted. Not diaphoretic. No erythema. No pallor.  Psychiatric: Mood, memory and judgment normal.  Vitals reviewed.  LABORATORY DATA: Lab Results  Component Value Date   WBC 3.4 (L) 06/12/2019   HGB 11.2 (L) 06/12/2019   HCT 35.4 (L) 06/12/2019   MCV 89.2 06/12/2019   PLT 97 (L) 06/12/2019  Chemistry      Component Value Date/Time   NA 140 06/12/2019 0821   NA 141 08/31/2017 1433   K 4.0 06/12/2019 0821   CL 107 06/12/2019 0821   CO2 20 (L) 06/12/2019 0821   BUN 25 (H) 06/12/2019 0821   BUN 33 (H) 08/31/2017 1433   CREATININE 1.52 (H) 06/12/2019 0821   CREATININE 1.35 (H) 12/25/2015 1310      Component Value Date/Time   CALCIUM 8.2 (L) 06/12/2019 0821   ALKPHOS 55 06/12/2019 0821   AST 20 06/12/2019 0821   ALT 17 06/12/2019 0821   BILITOT 0.7 06/12/2019 0821       RADIOGRAPHIC STUDIES:  No results found.   ASSESSMENT/PLAN:  This is a very pleasant 68 year old african male diagnosed with stage IIIc non-small cell lung cancer, squamous cell carcinoma.  He presented with a left upper lobe pleural-based mass in addition to left hilar and right paratracheal lymphadenopathy.  He was diagnosed in October 2020.  He is currently undergoing a course of concurrent chemoradiation with carboplatin for an AUC of 2 and paclitaxel 45 mg/m.  He is status post 5 cycles.  His last radiation treatment is scheduled for 06/27/2019.  The patient was seen with Dr. Julien Nordmann today.  Labs were reviewed.  Recommend that he proceed with cycle #6 today as scheduled.  We will see the  patient back for follow-up visit in 2 weeks for evaluation before starting cycle #8.  The patient will meet with the nutritionist team today regarding his weight loss. He was encouraged to use his PPI as prescribed for his reflux. The patient seemed unclear about his medications. I instructed him to bring his medication printout home and assess what he is taking.   I have sent a refill of his percocet to his pharmacy for his pain.   He will continue to use his compazine every 6 hours as needed for his nausea.  The patient was advised to call immediately if he has any concerning symptoms in the interval. The patient voices understanding of current disease status and treatment options and is in agreement with the current care plan. All questions were answered. The patient knows to call the clinic with any problems, questions or concerns. We can certainly see the patient much sooner if necessary  Orders Placed This Encounter  Procedures  . CBC with Differential (Cancer Center Only)    Standing Status:   Future    Standing Expiration Date:   06/11/2020  . CMP ([facility] only)    Standing Status:   Future    Standing Expiration Date:   06/11/2020     Tobe Sos Adrielle Polakowski, PA-C 06/12/19  ADDENDUM: Hematology/Oncology Attending: I had a face-to-face encounter with the patient today.  I recommended his care plan.  This is a very pleasant 68 years old African-American male recently diagnosed with stage IIIc non-small cell lung cancer, squamous cell carcinoma.  The patient is currently undergoing a course of concurrent chemoradiation with weekly carboplatin and paclitaxel status post 5 cycles.  He has been tolerating his treatment well with no concerning complaints except for midsternal chest pain as well as cough and fatigue. He denied having any significant dysphagia or odynophagia. I recommended for the patient to proceed with cycle #6 today as planned. He will come back for follow-up  visit in 2 weeks for evaluation before the last cycle of his treatment. For pain management we will give him refill of pain medication. He was advised to call  immediately if he has any concerning symptoms in the interval. Disclaimer: This note was dictated with voice recognition software. Similar sounding words can inadvertently be transcribed and may be missed upon review. Eilleen Kempf, MD 06/12/19

## 2019-06-12 ENCOUNTER — Inpatient Hospital Stay: Payer: Medicare HMO | Admitting: Nutrition

## 2019-06-12 ENCOUNTER — Ambulatory Visit: Payer: Medicare HMO

## 2019-06-12 ENCOUNTER — Other Ambulatory Visit: Payer: Self-pay

## 2019-06-12 ENCOUNTER — Inpatient Hospital Stay: Payer: Medicare HMO

## 2019-06-12 ENCOUNTER — Inpatient Hospital Stay (HOSPITAL_BASED_OUTPATIENT_CLINIC_OR_DEPARTMENT_OTHER): Payer: Medicare HMO | Admitting: Physician Assistant

## 2019-06-12 ENCOUNTER — Encounter: Payer: Self-pay | Admitting: Physician Assistant

## 2019-06-12 VITALS — BP 134/87 | HR 106 | Temp 98.0°F | Resp 20 | Ht 67.0 in | Wt 202.8 lb

## 2019-06-12 VITALS — HR 102

## 2019-06-12 DIAGNOSIS — I2510 Atherosclerotic heart disease of native coronary artery without angina pectoris: Secondary | ICD-10-CM | POA: Diagnosis not present

## 2019-06-12 DIAGNOSIS — Z951 Presence of aortocoronary bypass graft: Secondary | ICD-10-CM | POA: Diagnosis not present

## 2019-06-12 DIAGNOSIS — C3492 Malignant neoplasm of unspecified part of left bronchus or lung: Secondary | ICD-10-CM

## 2019-06-12 DIAGNOSIS — Z5111 Encounter for antineoplastic chemotherapy: Secondary | ICD-10-CM

## 2019-06-12 DIAGNOSIS — R072 Precordial pain: Secondary | ICD-10-CM | POA: Diagnosis not present

## 2019-06-12 DIAGNOSIS — I110 Hypertensive heart disease with heart failure: Secondary | ICD-10-CM | POA: Diagnosis not present

## 2019-06-12 DIAGNOSIS — I5022 Chronic systolic (congestive) heart failure: Secondary | ICD-10-CM | POA: Diagnosis not present

## 2019-06-12 DIAGNOSIS — J449 Chronic obstructive pulmonary disease, unspecified: Secondary | ICD-10-CM | POA: Diagnosis not present

## 2019-06-12 DIAGNOSIS — R110 Nausea: Secondary | ICD-10-CM | POA: Diagnosis not present

## 2019-06-12 LAB — CMP (CANCER CENTER ONLY)
ALT: 17 U/L (ref 0–44)
AST: 20 U/L (ref 15–41)
Albumin: 3.7 g/dL (ref 3.5–5.0)
Alkaline Phosphatase: 55 U/L (ref 38–126)
Anion gap: 13 (ref 5–15)
BUN: 25 mg/dL — ABNORMAL HIGH (ref 8–23)
CO2: 20 mmol/L — ABNORMAL LOW (ref 22–32)
Calcium: 8.2 mg/dL — ABNORMAL LOW (ref 8.9–10.3)
Chloride: 107 mmol/L (ref 98–111)
Creatinine: 1.52 mg/dL — ABNORMAL HIGH (ref 0.61–1.24)
GFR, Est AFR Am: 54 mL/min — ABNORMAL LOW (ref >60)
GFR, Estimated: 47 mL/min — ABNORMAL LOW (ref >60)
Glucose, Bld: 92 mg/dL (ref 70–99)
Potassium: 4 mmol/L (ref 3.5–5.1)
Sodium: 140 mmol/L (ref 135–145)
Total Bilirubin: 0.7 mg/dL (ref 0.3–1.2)
Total Protein: 7.2 g/dL (ref 6.5–8.1)

## 2019-06-12 LAB — CBC WITH DIFFERENTIAL (CANCER CENTER ONLY)
Abs Immature Granulocytes: 0.01 10*3/uL (ref 0.00–0.07)
Basophils Absolute: 0 10*3/uL (ref 0.0–0.1)
Basophils Relative: 1 %
Eosinophils Absolute: 0 10*3/uL (ref 0.0–0.5)
Eosinophils Relative: 1 %
HCT: 35.4 % — ABNORMAL LOW (ref 39.0–52.0)
Hemoglobin: 11.2 g/dL — ABNORMAL LOW (ref 13.0–17.0)
Immature Granulocytes: 0 %
Lymphocytes Relative: 7 %
Lymphs Abs: 0.3 10*3/uL — ABNORMAL LOW (ref 0.7–4.0)
MCH: 28.2 pg (ref 26.0–34.0)
MCHC: 31.6 g/dL (ref 30.0–36.0)
MCV: 89.2 fL (ref 80.0–100.0)
Monocytes Absolute: 0.3 10*3/uL (ref 0.1–1.0)
Monocytes Relative: 8 %
Neutro Abs: 2.8 10*3/uL (ref 1.7–7.7)
Neutrophils Relative %: 83 %
Platelet Count: 97 10*3/uL — ABNORMAL LOW (ref 150–400)
RBC: 3.97 MIL/uL — ABNORMAL LOW (ref 4.22–5.81)
RDW: 17.1 % — ABNORMAL HIGH (ref 11.5–15.5)
WBC Count: 3.4 10*3/uL — ABNORMAL LOW (ref 4.0–10.5)
nRBC: 0 % (ref 0.0–0.2)

## 2019-06-12 MED ORDER — DIPHENHYDRAMINE HCL 50 MG/ML IJ SOLN
50.0000 mg | Freq: Once | INTRAMUSCULAR | Status: AC
  Administered 2019-06-12: 50 mg via INTRAVENOUS

## 2019-06-12 MED ORDER — PALONOSETRON HCL INJECTION 0.25 MG/5ML
0.2500 mg | Freq: Once | INTRAVENOUS | Status: AC
  Administered 2019-06-12: 0.25 mg via INTRAVENOUS

## 2019-06-12 MED ORDER — FAMOTIDINE IN NACL 20-0.9 MG/50ML-% IV SOLN
INTRAVENOUS | Status: AC
  Filled 2019-06-12: qty 50

## 2019-06-12 MED ORDER — SODIUM CHLORIDE 0.9 % IV SOLN
160.0000 mg | Freq: Once | INTRAVENOUS | Status: AC
  Administered 2019-06-12: 160 mg via INTRAVENOUS
  Filled 2019-06-12: qty 16

## 2019-06-12 MED ORDER — DIPHENHYDRAMINE HCL 50 MG/ML IJ SOLN
INTRAMUSCULAR | Status: AC
  Filled 2019-06-12: qty 1

## 2019-06-12 MED ORDER — OXYCODONE-ACETAMINOPHEN 5-325 MG PO TABS: 1.0000 | ORAL_TABLET | Freq: Three times a day (TID) | ORAL | 0 refills | Status: DC | PRN

## 2019-06-12 MED ORDER — PALONOSETRON HCL INJECTION 0.25 MG/5ML
INTRAVENOUS | Status: AC
  Filled 2019-06-12: qty 5

## 2019-06-12 MED ORDER — FAMOTIDINE IN NACL 20-0.9 MG/50ML-% IV SOLN
20.0000 mg | Freq: Once | INTRAVENOUS | Status: AC
  Administered 2019-06-12: 20 mg via INTRAVENOUS

## 2019-06-12 MED ORDER — SODIUM CHLORIDE 0.9 % IV SOLN
20.0000 mg | Freq: Once | INTRAVENOUS | Status: AC
  Administered 2019-06-12: 20 mg via INTRAVENOUS
  Filled 2019-06-12: qty 20

## 2019-06-12 MED ORDER — SODIUM CHLORIDE 0.9 % IV SOLN
Freq: Once | INTRAVENOUS | Status: AC
  Filled 2019-06-12: qty 250

## 2019-06-12 MED ORDER — SODIUM CHLORIDE 0.9 % IV SOLN
45.0000 mg/m2 | Freq: Once | INTRAVENOUS | Status: AC
  Administered 2019-06-12: 96 mg via INTRAVENOUS
  Filled 2019-06-12: qty 16

## 2019-06-12 NOTE — Progress Notes (Signed)
Per Dr. Julien Nordmann, okay for patient to receive treatment today with creatine 1.52, platelets 97 and heart rate of 102.

## 2019-06-12 NOTE — Patient Instructions (Signed)
[facility] Discharge Instructions for Patients Receiving Chemotherapy  Today you received the following chemotherapy agents Taxol, Carboplatin  To help prevent nausea and vomiting after your treatment, we encourage you to take your nausea medication as directed  If you develop nausea and vomiting that is not controlled by your nausea medication, call the clinic.   BELOW ARE SYMPTOMS THAT SHOULD BE REPORTED IMMEDIATELY:  *FEVER GREATER THAN 100.5 F  *CHILLS WITH OR WITHOUT FEVER  NAUSEA AND VOMITING THAT IS NOT CONTROLLED WITH YOUR NAUSEA MEDICATION  *UNUSUAL SHORTNESS OF BREATH  *UNUSUAL BRUISING OR BLEEDING  TENDERNESS IN MOUTH AND THROAT WITH OR WITHOUT PRESENCE OF ULCERS  *URINARY PROBLEMS  *BOWEL PROBLEMS  UNUSUAL RASH Items with * indicate a potential emergency and should be followed up as soon as possible.  Feel free to call the clinic should you have any questions or concerns. The clinic phone number is [phone].  Please show the CHEMO ALERT CARD at check-in to the Emergency Department and triage nurse.   

## 2019-06-12 NOTE — Progress Notes (Signed)
Patient was identified to be at risk for malnutrition on the MST secondary to poor appetite and weight loss.  68 year old male diagnosed with non-small cell lung cancer receiving concurrent chemoradiation therapy. He is a patient of Dr. Julien Nordmann.  Past medical history includes tobacco, GERD, gout, diabetes, COPD, CHF, and CAD.  Medications include Lipitor, B complex vitamin, Colace, Lasix, Prilosec, Zofran, and Compazine.  Labs were reviewed.  Height: 5 feet 7 inches. Weight: 202.8 pounds on December 21. Usual body weight: 218 pounds in October 2020.  BMI: 31.76.  Patient reports he had a little nausea this morning however really has had no nutrition impact symptoms overall. He endorses 16 pound weight loss since October. He has never tried oral nutrition supplements but is willing.  Nutrition diagnosis:  Unintended weight loss related to lung cancer and associated treatments as evidenced by no prior need for nutrition related information.  Intervention: Patient educated to consume smaller more frequent meals and snacks using high-calorie, high-protein foods. Recommended patient consume Ensure Enlive twice daily. Provided samples and coupons. Provided fact sheets on increasing calories and protein and soft protein foods. Questions were answered and teach back method used.  Contact information has been given.  Monitoring, evaluation, goals: Patient will tolerate increased calories and protein to minimize further weight loss.  Next visit:  No follow-up has been scheduled however patient does have my contact information for questions.  **Disclaimer: This note was dictated with voice recognition software. Similar sounding words can inadvertently be transcribed and this note may contain transcription errors which may not have been corrected upon publication of note.**

## 2019-06-13 ENCOUNTER — Telehealth: Payer: Self-pay | Admitting: Medical Oncology

## 2019-06-13 ENCOUNTER — Other Ambulatory Visit: Payer: Self-pay

## 2019-06-13 ENCOUNTER — Ambulatory Visit
Admission: RE | Admit: 2019-06-13 | Discharge: 2019-06-13 | Disposition: A | Discharge status: Home / Self Care | Payer: Medicare HMO | Source: Ambulatory Visit | Attending: Radiation Oncology | Admitting: Radiation Oncology

## 2019-06-13 DIAGNOSIS — C3412 Malignant neoplasm of upper lobe, left bronchus or lung: Secondary | ICD-10-CM | POA: Diagnosis not present

## 2019-06-13 DIAGNOSIS — F17210 Nicotine dependence, cigarettes, uncomplicated: Secondary | ICD-10-CM | POA: Diagnosis not present

## 2019-06-13 DIAGNOSIS — Z510 Encounter for antineoplastic radiation therapy: Secondary | ICD-10-CM | POA: Diagnosis not present

## 2019-06-13 LAB — ACID FAST CULTURE WITH REFLEXED SENSITIVITIES (MYCOBACTERIA): Acid Fast Culture: NEGATIVE

## 2019-06-13 NOTE — Telephone Encounter (Signed)
Confirmed Jan appts.

## 2019-06-13 NOTE — Telephone Encounter (Signed)
transportation issues for Thursday . Transferred to xrt.

## 2019-06-14 ENCOUNTER — Ambulatory Visit
Admission: RE | Admit: 2019-06-14 | Discharge: 2019-06-14 | Disposition: A | Discharge status: Home / Self Care | Payer: Medicare HMO | Source: Ambulatory Visit | Attending: Radiation Oncology | Admitting: Radiation Oncology

## 2019-06-14 ENCOUNTER — Other Ambulatory Visit: Payer: Self-pay

## 2019-06-14 DIAGNOSIS — Z510 Encounter for antineoplastic radiation therapy: Secondary | ICD-10-CM | POA: Diagnosis not present

## 2019-06-14 DIAGNOSIS — F17210 Nicotine dependence, cigarettes, uncomplicated: Secondary | ICD-10-CM | POA: Diagnosis not present

## 2019-06-14 DIAGNOSIS — C3412 Malignant neoplasm of upper lobe, left bronchus or lung: Secondary | ICD-10-CM | POA: Diagnosis not present

## 2019-06-15 ENCOUNTER — Other Ambulatory Visit: Payer: Self-pay

## 2019-06-15 ENCOUNTER — Ambulatory Visit
Admission: RE | Admit: 2019-06-15 | Discharge: 2019-06-15 | Disposition: A | Discharge status: Home / Self Care | Payer: Medicare HMO | Source: Ambulatory Visit | Attending: Radiation Oncology | Admitting: Radiation Oncology

## 2019-06-15 DIAGNOSIS — F17210 Nicotine dependence, cigarettes, uncomplicated: Secondary | ICD-10-CM | POA: Diagnosis not present

## 2019-06-15 DIAGNOSIS — C3412 Malignant neoplasm of upper lobe, left bronchus or lung: Secondary | ICD-10-CM | POA: Diagnosis not present

## 2019-06-15 DIAGNOSIS — Z510 Encounter for antineoplastic radiation therapy: Secondary | ICD-10-CM | POA: Diagnosis not present

## 2019-06-19 ENCOUNTER — Inpatient Hospital Stay: Payer: Medicare HMO

## 2019-06-19 ENCOUNTER — Other Ambulatory Visit: Payer: Self-pay | Admitting: Medical Oncology

## 2019-06-19 ENCOUNTER — Ambulatory Visit (HOSPITAL_BASED_OUTPATIENT_CLINIC_OR_DEPARTMENT_OTHER): Payer: Medicare HMO | Admitting: Medical

## 2019-06-19 ENCOUNTER — Other Ambulatory Visit: Payer: Self-pay | Admitting: Medical

## 2019-06-19 ENCOUNTER — Other Ambulatory Visit: Payer: Self-pay

## 2019-06-19 ENCOUNTER — Ambulatory Visit
Admission: RE | Admit: 2019-06-19 | Discharge: 2019-06-19 | Disposition: A | Discharge status: Home / Self Care | Payer: Medicare HMO | Source: Ambulatory Visit | Attending: Radiation Oncology | Admitting: Radiation Oncology

## 2019-06-19 VITALS — BP 111/95 | HR 60 | Temp 98.5°F | Resp 18 | Wt 197.0 lb

## 2019-06-19 DIAGNOSIS — R072 Precordial pain: Secondary | ICD-10-CM | POA: Diagnosis not present

## 2019-06-19 DIAGNOSIS — I5022 Chronic systolic (congestive) heart failure: Secondary | ICD-10-CM | POA: Diagnosis not present

## 2019-06-19 DIAGNOSIS — Z510 Encounter for antineoplastic radiation therapy: Secondary | ICD-10-CM | POA: Diagnosis not present

## 2019-06-19 DIAGNOSIS — C3492 Malignant neoplasm of unspecified part of left bronchus or lung: Secondary | ICD-10-CM

## 2019-06-19 DIAGNOSIS — M109 Gout, unspecified: Secondary | ICD-10-CM | POA: Diagnosis not present

## 2019-06-19 DIAGNOSIS — R0789 Other chest pain: Secondary | ICD-10-CM

## 2019-06-19 DIAGNOSIS — N1831 Chronic kidney disease, stage 3a: Secondary | ICD-10-CM | POA: Diagnosis not present

## 2019-06-19 DIAGNOSIS — E1151 Type 2 diabetes mellitus with diabetic peripheral angiopathy without gangrene: Secondary | ICD-10-CM | POA: Diagnosis not present

## 2019-06-19 DIAGNOSIS — Z951 Presence of aortocoronary bypass graft: Secondary | ICD-10-CM | POA: Diagnosis not present

## 2019-06-19 DIAGNOSIS — E1140 Type 2 diabetes mellitus with diabetic neuropathy, unspecified: Secondary | ICD-10-CM | POA: Diagnosis not present

## 2019-06-19 DIAGNOSIS — I2510 Atherosclerotic heart disease of native coronary artery without angina pectoris: Secondary | ICD-10-CM | POA: Diagnosis not present

## 2019-06-19 DIAGNOSIS — J449 Chronic obstructive pulmonary disease, unspecified: Secondary | ICD-10-CM | POA: Diagnosis not present

## 2019-06-19 DIAGNOSIS — I130 Hypertensive heart and chronic kidney disease with heart failure and stage 1 through stage 4 chronic kidney disease, or unspecified chronic kidney disease: Secondary | ICD-10-CM | POA: Diagnosis not present

## 2019-06-19 DIAGNOSIS — I509 Heart failure, unspecified: Secondary | ICD-10-CM | POA: Diagnosis not present

## 2019-06-19 DIAGNOSIS — C3412 Malignant neoplasm of upper lobe, left bronchus or lung: Secondary | ICD-10-CM | POA: Diagnosis not present

## 2019-06-19 DIAGNOSIS — F17210 Nicotine dependence, cigarettes, uncomplicated: Secondary | ICD-10-CM | POA: Diagnosis not present

## 2019-06-19 DIAGNOSIS — Z5111 Encounter for antineoplastic chemotherapy: Secondary | ICD-10-CM | POA: Diagnosis not present

## 2019-06-19 DIAGNOSIS — E7800 Pure hypercholesterolemia, unspecified: Secondary | ICD-10-CM | POA: Diagnosis not present

## 2019-06-19 DIAGNOSIS — R110 Nausea: Secondary | ICD-10-CM | POA: Diagnosis not present

## 2019-06-19 DIAGNOSIS — I110 Hypertensive heart disease with heart failure: Secondary | ICD-10-CM | POA: Diagnosis not present

## 2019-06-19 LAB — CBC WITH DIFFERENTIAL (CANCER CENTER ONLY)
Abs Immature Granulocytes: 0.01 10*3/uL (ref 0.00–0.07)
Basophils Absolute: 0 10*3/uL (ref 0.0–0.1)
Basophils Relative: 0 %
Eosinophils Absolute: 0 10*3/uL (ref 0.0–0.5)
Eosinophils Relative: 0 %
HCT: 32.8 % — ABNORMAL LOW (ref 39.0–52.0)
Hemoglobin: 10.4 g/dL — ABNORMAL LOW (ref 13.0–17.0)
Immature Granulocytes: 0 %
Lymphocytes Relative: 15 %
Lymphs Abs: 0.4 10*3/uL — ABNORMAL LOW (ref 0.7–4.0)
MCH: 28.4 pg (ref 26.0–34.0)
MCHC: 31.7 g/dL (ref 30.0–36.0)
MCV: 89.6 fL (ref 80.0–100.0)
Monocytes Absolute: 0.2 10*3/uL (ref 0.1–1.0)
Monocytes Relative: 8 %
Neutro Abs: 1.8 10*3/uL (ref 1.7–7.7)
Neutrophils Relative %: 77 %
Platelet Count: 95 10*3/uL — ABNORMAL LOW (ref 150–400)
RBC: 3.66 MIL/uL — ABNORMAL LOW (ref 4.22–5.81)
RDW: 17.2 % — ABNORMAL HIGH (ref 11.5–15.5)
WBC Count: 2.4 10*3/uL — ABNORMAL LOW (ref 4.0–10.5)
nRBC: 0 % (ref 0.0–0.2)

## 2019-06-19 LAB — CMP (CANCER CENTER ONLY)
ALT: 15 U/L (ref 0–44)
AST: 16 U/L (ref 15–41)
Albumin: 3.7 g/dL (ref 3.5–5.0)
Alkaline Phosphatase: 57 U/L (ref 38–126)
Anion gap: 11 (ref 5–15)
BUN: 24 mg/dL — ABNORMAL HIGH (ref 8–23)
CO2: 20 mmol/L — ABNORMAL LOW (ref 22–32)
Calcium: 7.7 mg/dL — ABNORMAL LOW (ref 8.9–10.3)
Chloride: 107 mmol/L (ref 98–111)
Creatinine: 1.71 mg/dL — ABNORMAL HIGH (ref 0.61–1.24)
GFR, Est AFR Am: 47 mL/min — ABNORMAL LOW (ref >60)
GFR, Estimated: 41 mL/min — ABNORMAL LOW (ref >60)
Glucose, Bld: 93 mg/dL (ref 70–99)
Potassium: 4.1 mmol/L (ref 3.5–5.1)
Sodium: 138 mmol/L (ref 135–145)
Total Bilirubin: 0.7 mg/dL (ref 0.3–1.2)
Total Protein: 7.1 g/dL (ref 6.5–8.1)

## 2019-06-19 MED ORDER — PALONOSETRON HCL INJECTION 0.25 MG/5ML
0.2500 mg | Freq: Once | INTRAVENOUS | Status: AC
  Administered 2019-06-19: 0.25 mg via INTRAVENOUS

## 2019-06-19 MED ORDER — MORPHINE SULFATE 4 MG/ML IJ SOLN: 2.0000 mg | Freq: Once | INTRAMUSCULAR | Status: DC

## 2019-06-19 MED ORDER — DIPHENHYDRAMINE HCL 50 MG/ML IJ SOLN
50.0000 mg | Freq: Once | INTRAMUSCULAR | Status: AC
  Administered 2019-06-19: 50 mg via INTRAVENOUS

## 2019-06-19 MED ORDER — SODIUM CHLORIDE 0.9 % IV SOLN
160.0000 mg | Freq: Once | INTRAVENOUS | Status: AC
  Administered 2019-06-19: 160 mg via INTRAVENOUS
  Filled 2019-06-19: qty 16

## 2019-06-19 MED ORDER — SODIUM CHLORIDE 0.9 % IV SOLN
20.0000 mg | Freq: Once | INTRAVENOUS | Status: AC
  Administered 2019-06-19: 20 mg via INTRAVENOUS
  Filled 2019-06-19: qty 20

## 2019-06-19 MED ORDER — MORPHINE SULFATE 4 MG/ML IJ SOLN
2.0000 mg | Freq: Once | INTRAMUSCULAR | Status: AC
  Administered 2019-06-19: 2 mg via INTRAVENOUS

## 2019-06-19 MED ORDER — DIPHENHYDRAMINE HCL 50 MG/ML IJ SOLN
INTRAMUSCULAR | Status: AC
  Filled 2019-06-19: qty 1

## 2019-06-19 MED ORDER — SODIUM CHLORIDE 0.9 % IV SOLN
Freq: Once | INTRAVENOUS | Status: AC
  Filled 2019-06-19: qty 250

## 2019-06-19 MED ORDER — FAMOTIDINE IN NACL 20-0.9 MG/50ML-% IV SOLN
20.0000 mg | Freq: Once | INTRAVENOUS | Status: AC
  Administered 2019-06-19: 20 mg via INTRAVENOUS

## 2019-06-19 MED ORDER — FAMOTIDINE IN NACL 20-0.9 MG/50ML-% IV SOLN
INTRAVENOUS | Status: AC
  Filled 2019-06-19: qty 50

## 2019-06-19 MED ORDER — MORPHINE SULFATE (PF) 4 MG/ML IV SOLN
INTRAVENOUS | Status: AC
  Filled 2019-06-19: qty 1

## 2019-06-19 MED ORDER — SODIUM CHLORIDE 0.9 % IV SOLN
45.0000 mg/m2 | Freq: Once | INTRAVENOUS | Status: AC
  Administered 2019-06-19: 17:00:00 96 mg via INTRAVENOUS
  Filled 2019-06-19: qty 16

## 2019-06-19 MED ORDER — PALONOSETRON HCL INJECTION 0.25 MG/5ML
INTRAVENOUS | Status: AC
  Filled 2019-06-19: qty 5

## 2019-06-19 NOTE — Progress Notes (Signed)
Per Dr Julien Nordmann it is okay to treat pt today with carboplatin and taxol and todays lab values.

## 2019-06-19 NOTE — Patient Instructions (Signed)
[facility] Discharge Instructions for Patients Receiving Chemotherapy  Today you received the following chemotherapy agents: Taxol/Carboplatin  To help prevent nausea and vomiting after your treatment, we encourage you to take your nausea medication as directed.   If you develop nausea and vomiting that is not controlled by your nausea medication, call the clinic.   BELOW ARE SYMPTOMS THAT SHOULD BE REPORTED IMMEDIATELY:  *FEVER GREATER THAN 100.5 F  *CHILLS WITH OR WITHOUT FEVER  NAUSEA AND VOMITING THAT IS NOT CONTROLLED WITH YOUR NAUSEA MEDICATION  *UNUSUAL SHORTNESS OF BREATH  *UNUSUAL BRUISING OR BLEEDING  TENDERNESS IN MOUTH AND THROAT WITH OR WITHOUT PRESENCE OF ULCERS  *URINARY PROBLEMS  *BOWEL PROBLEMS  UNUSUAL RASH Items with * indicate a potential emergency and should be followed up as soon as possible.  Feel free to call the clinic should you have any questions or concerns. The clinic phone number is [phone].  Please show the [name] at check-in to the Emergency Department and triage nurse.

## 2019-06-20 ENCOUNTER — Other Ambulatory Visit: Payer: Self-pay

## 2019-06-20 ENCOUNTER — Ambulatory Visit
Admission: RE | Admit: 2019-06-20 | Discharge: 2019-06-20 | Disposition: A | Discharge status: Home / Self Care | Payer: Medicare HMO | Source: Ambulatory Visit | Attending: Radiation Oncology | Admitting: Radiation Oncology

## 2019-06-20 DIAGNOSIS — F17210 Nicotine dependence, cigarettes, uncomplicated: Secondary | ICD-10-CM | POA: Diagnosis not present

## 2019-06-20 DIAGNOSIS — C3412 Malignant neoplasm of upper lobe, left bronchus or lung: Secondary | ICD-10-CM | POA: Diagnosis not present

## 2019-06-20 DIAGNOSIS — Z510 Encounter for antineoplastic radiation therapy: Secondary | ICD-10-CM | POA: Diagnosis not present

## 2019-06-20 NOTE — Progress Notes (Signed)
[facility] Progress Note   [name] [name] [id number] 09-14-1950 68 y.o.  [name] [name] is managed by Dr. Fanny Bien. 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 Julien Nordmann and is currently receiving concurrent chemoradiation with carboplatin and paclitaxel.  He is scheduled to return on 06/27/2019 for follow-up.  Chest pain: The patient has a history of extensive cardiac disease.  An EKG was completed which showed sinus tachycardia with possible premature atrial complexes or fusion complexes, right bundle branch block, left anterior fascicular block, inferior infarct age undetermined.  This was compared to an EKG that was completed October 2020 and showed no acute changes.  Patient was given morphine 2 mg IV x1 with resolution of his pain.  Please see After Visit Summary for patient specific instructions.  Future Appointments  Date Time Provider [place]  06/21/2019  1:35 PM [facility] LINAC 4 [facility] None  06/22/2019  7:40 AM [facility] DEVICE REMOTES [facility]  06/22/2019  9:15 AM [facility] LINAC 4 [facility] None  06/26/2019  1:35 PM [facility] LINAC 4 [facility] None  06/27/2019  8:15 AM [facility] LAB 1 [facility] None  06/27/2019  8:45 AM Curt Bears, MD [facility] None  06/27/2019  1:35 PM [facility] LINAC 4 [facility] None  06/28/2019  8:40 AM [facility] DEVICE REMOTES [facility]  06/28/2019 10:30 AM [facility] INFUSION [facility] None  06/28/2019  2:00 PM [facility] LINAC 4 [facility] None  06/30/2019 10:45 AM Stover, Lady Saucier, DPM [facility]  09/21/2019   7:40 AM [facility] DEVICE REMOTES [facility]  12/21/2019  7:40 AM [facility] DEVICE REMOTES [facility]  03/21/2020  7:40 AM [facility] DEVICE REMOTES [facility]    No orders of the defined types were placed in this encounter.      Subjective:   Patient ID:  [name] [name] is a 68 y.o. (DOB [birth year]) male.  Chief Complaint: No chief complaint on file.   HPI [name] [name]  Is a 68 y.o. male with a diagnosis of a stage III squamous cell carcinoma of the left lung. He is managed by Dr. Julien Nordmann and is currently receiving concurrent chemoradiation with carboplatin and paclitaxel.  During his chemotherapy denied he expressed that he was having substernal chest pain.  He was not able to quantify his chest pain and was not able to state whether it was similar to cardiac pain that he has experienced in the past.  He had no increased shortness of breath over his baseline and was not diaphoretic.  His oxygen saturation was 100% on room air with respirations of 18, blood pressure of 111/75 temp of 98.5.  An EKG was completed with results showing sinus tachycardia with possible premature atrial complexes or fusion complexes, right bundle branch block, left anterior fascicular block, inferior infarct age undetermined.  This was compared to an EKG that was completed October 2020 and showed no acute changes.     Medications: I have reviewed the patient's current medications.  Allergies:  Allergies  Allergen Reactions  . Penicillins Nausea And Vomiting, Swelling and Other (See Comments)    Per Dr Audria Nine Via phone 12/27/12 0400 SWELLING REACTION UNSPECIFIED     Past Medical History:  Diagnosis Date  .  AICD (automatic cardioverter/defibrillator) present    reports no shocks  - st Jude  . Ambulates with cane   . CAD (coronary artery disease)    a. 04/2013 Cath: LM 10, LAD 40p, 4m, D1 50p, LCX 60p, 38m/100m, OM1 50, OM2 100 L-L collats, RCA 100p;  b. CABG x  5: LIMA->LAD, VG->D2, VG->RI->OM2, VG->PDA.  . Cataracts, bilateral   . Chronic systolic CHF (congestive heart failure) ([place])    a. 04/2013 Echo: EF 15-20%, diff HK, sev HK of inf and ant myocardium, dilated LA,  b. EF 20-25% with restrictive filling pattern, mod RV dysfx, mild MR (10/10/2013);  c.  Echo 12/15: EF 25-30%  . COPD (chronic obstructive pulmonary disease) ([place])   . Diabetes mellitus without complication ([place])    unsure what type but was dx as an adult, takes no meds, check his cbg at home 2 times a week   . Emphysema   . GERD (gastroesophageal reflux disease)   . Gout   . Hyperlipidemia   . Hypertension   . Ischemic cardiomyopathy   . PVD (peripheral vascular disease) ([place])    a. (10/2013) ABIs: RIGHT 0.63, Waveforms: monophasic;  LEFT 0.78, Waveforms: monophasic  . Tobacco abuse    30+ pack-year history  . Transaminitis    a. 04/2013 Abd U/S and CT unremarkable, hepatitis panel neg -->felt to be 2/2 acute R heart failure.    Past Surgical History:  Procedure Laterality Date  . APPENDECTOMY  as a child  . CARDIAC CATHETERIZATION  04/08/2015   Procedure: Left Heart Cath and Cors/Grafts Angiography;  Surgeon: Peter M Martinique, MD;  Location: [place] [facility];  Service: Cardiovascular;;  . COLONOSCOPY    . CORONARY ARTERY BYPASS GRAFT N/A 05/05/2013   Procedure: CORONARY ARTERY BYPASS GRAFTING (CABG) TIMES FIVE USING LEFT INTERNAL MAMMARY ARTERY AND RIGHT AND LEFT SAPHENOUS LEG VEIN HARVESTED ENDOSCOPICALLY;  Surgeon: Melrose Nakayama, MD;  Location: [place];  Service: Open Heart Surgery;  Laterality: N/A;  . IMPLANTABLE CARDIOVERTER DEFIBRILLATOR IMPLANT N/A 07/06/2014   Procedure: St Jude IMPLANTABLE CARDIOVERTER DEFIBRILLATOR IMPLANT;  Surgeon: Deboraha Sprang, MD;  Location[facility] CATH LAB;  Service: Cardiovascular;  Laterality: N/A;  . LEFT AND RIGHT HEART CATHETERIZATION WITH CORONARY ANGIOGRAM N/A 04/28/2013   Procedure: LEFT AND RIGHT HEART CATHETERIZATION WITH CORONARY  ANGIOGRAM;  Surgeon: Burnell Blanks, MD;  Location: [facility] CATH LAB;  Service: Cardiovascular;  Laterality: N/A;  . MULTIPLE EXTRACTIONS WITH ALVEOLOPLASTY N/A 05/03/2013   Procedure: Extraction of tooth #s 3,4,5,6,8,9,27 with alveoloplast and maxillary left osseous tuberosity reduction;  Surgeon: Lenn Cal, DDS;  Location: [place];  Service: Oral Surgery;  Laterality: N/A;  . TOTAL HIP ARTHROPLASTY Right 11/30/2018   Procedure: TOTAL HIP ARTHROPLASTY ANTERIOR APPROACH;  Surgeon: Rod Can, MD;  [facility];  Service: Orthopedics;  Laterality: Right;  Marland Kitchen VIDEO BRONCHOSCOPY WITH ENDOBRONCHIAL ULTRASOUND N/A 04/20/2019   Procedure: VIDEO BRONCHOSCOPY WITH ENDOBRONCHIAL ULTRASOUND;  Surgeon: Margaretha Seeds, MD;  Location: [place];  Service: Thoracic;  Laterality: N/A;    Family History  Problem Relation Age of Onset  . Diabetes Mother   . Hypertension Father   . Hypertension Other   . Heart attack Other        Brothers  . Diabetes Brother   . Heart attack Brother   . Heart attack Brother   . Stroke Neg Hx     Social History   Socioeconomic History  . Marital status: Single    Spouse  name: Not on file  . Number of children: Not on file  . Years of education: Not on file  . Highest education level: Not on file  Occupational History  . Occupation: Retired  Tobacco Use  . Smoking status: Current Every Day Smoker    Packs/day: 2.00    Years: 30.00    Pack years: 60.00    Types: Cigarettes    Start date: 65  . Smokeless tobacco: Never Used  . Tobacco comment: Pt states now smokes - half pack per day 04/05/19  Substance and Sexual Activity  . Alcohol use: Not Currently  . Drug use: Yes    Types: Marijuana    Comment: last use 04/17/19  . Sexual activity: Not on file  Other Topics Concern  . Not on file  Social History Narrative   Lives in [place] with his girlfriend and her daughter.     Social Determinants of Health   Financial Resource Strain:   .  Difficulty of Paying Living Expenses: Not on file  Food Insecurity:   . Worried About Charity fundraiser in the Last Year: Not on file  . Ran Out of Food in the Last Year: Not on file  Transportation Needs:   . Lack of Transportation (Medical): Not on file  . Lack of Transportation (Non-Medical): Not on file  Physical Activity:   . Days of Exercise per Week: Not on file  . Minutes of Exercise per Session: Not on file  Stress:   . Feeling of Stress : Not on file  Social Connections:   . Frequency of Communication with Friends and Family: Not on file  . Frequency of Social Gatherings with Friends and Family: Not on file  . Attends Religious Services: Not on file  . Active Member of Clubs or Organizations: Not on file  . Attends Archivist Meetings: Not on file  . Marital Status: Not on file  Intimate Partner Violence:   . Fear of Current or Ex-Partner: Not on file  . Emotionally Abused: Not on file  . Physically Abused: Not on file  . Sexually Abused: Not on file    Past Medical History, Surgical history, Social history, and Family history were reviewed and updated as appropriate.   Please see review of systems for further details on the patient's review from today.   Review of Systems:  Review of Systems  Constitutional: Negative for chills, diaphoresis and fever.  HENT: Negative for trouble swallowing and voice change.   Respiratory: Negative for cough, chest tightness, shortness of breath and wheezing.   Cardiovascular: Positive for chest pain. Negative for palpitations.  Gastrointestinal: Negative for abdominal pain, constipation, diarrhea, nausea and vomiting.  Musculoskeletal: Negative for back pain and myalgias.  Neurological: Negative for dizziness, light-headedness and headaches.  Psychiatric/Behavioral: Negative for agitation. The patient is not nervous/anxious.     Objective:   Physical Exam:  There were no vitals taken for this visit. ECOG:  1  Physical Exam Constitutional:      General: He is not in acute distress.    Appearance: He is not diaphoretic.  HENT:     Head: Normocephalic and atraumatic.  Cardiovascular:     Rate and Rhythm: Normal rate and regular rhythm.     Heart sounds: Normal heart sounds. No murmur. No friction rub. No gallop.   Pulmonary:     Effort: Pulmonary effort is normal. No respiratory distress.     Breath sounds: Normal breath sounds. No wheezing  or rales.  Skin:    General: Skin is warm and dry.     Findings: No erythema or rash.     Lab Review:     Component Value Date/Time   NA 138 06/19/2019 1303   NA 141 08/31/2017 1433   K 4.1 06/19/2019 1303   CL 107 06/19/2019 1303   CO2 20 (L) 06/19/2019 1303   GLUCOSE 93 06/19/2019 1303   BUN 24 (H) 06/19/2019 1303   BUN 33 (H) 08/31/2017 1433   CREATININE 1.71 (H) 06/19/2019 1303   CREATININE 1.35 (H) 12/25/2015 1310   CALCIUM 7.7 (L) 06/19/2019 1303   PROT 7.1 06/19/2019 1303   ALBUMIN 3.7 06/19/2019 1303   AST 16 06/19/2019 1303   ALT 15 06/19/2019 1303   ALKPHOS 57 06/19/2019 1303   BILITOT 0.7 06/19/2019 1303   GFRNONAA 41 (L) 06/19/2019 1303   GFRAA 47 (L) 06/19/2019 1303       Component Value Date/Time   WBC 2.4 (L) 06/19/2019 1303   WBC 12.1 (H) 04/20/2019 1052   RBC 3.66 (L) 06/19/2019 1303   HGB 10.4 (L) 06/19/2019 1303   HCT 32.8 (L) 06/19/2019 1303   PLT 95 (L) 06/19/2019 1303   MCV 89.6 06/19/2019 1303   MCH 28.4 06/19/2019 1303   MCHC 31.7 06/19/2019 1303   RDW 17.2 (H) 06/19/2019 1303   LYMPHSABS 0.4 (L) 06/19/2019 1303   MONOABS 0.2 06/19/2019 1303   EOSABS 0.0 06/19/2019 1303   BASOSABS 0.0 06/19/2019 1303   -------------------------------  Imaging from last 24 hours (if applicable):  Radiology interpretation: No results found.

## 2019-06-21 ENCOUNTER — Ambulatory Visit: Payer: Medicare HMO

## 2019-06-21 ENCOUNTER — Encounter: Payer: Self-pay | Admitting: *Deleted

## 2019-06-21 ENCOUNTER — Telehealth: Payer: Self-pay | Admitting: Medical Oncology

## 2019-06-21 NOTE — Telephone Encounter (Signed)
Transportation did not pick up pt today . I gave Delphine the main number to transportation to call . [place] 4 notified.

## 2019-06-21 NOTE — Progress Notes (Signed)
Oncology Nurse Navigator Documentation  Oncology Nurse Navigator Flowsheets 06/21/2019  Abnormal Finding Date -  Confirmed Diagnosis Date -  Diagnosis Status -  Planned Course of Treatment -  Phase of Treatment -  Chemo/Radiation Concurrent Actual Start Date: -  Navigator Location [facility]  Referral Date to Rad[facility]c/Med[facility]c -  Navigator Encounter Type Other/I received a message from Sam with Rad Onc  She has concerns about patients odd behavior.  I updated Dr. Julien Nordmann.    Telephone -  Treatment Initiated Date -  Patient Visit Type -  Treatment Phase Treatment  Barriers/Navigation Needs Coordination of Care  Education -  Interventions Coordination of Care  Acuity Level 2-Minimal Needs (1-2 Barriers Identified)  Coordination of Care Other  Education Method -  Time Spent with Patient 30

## 2019-06-22 ENCOUNTER — Telehealth: Payer: Self-pay

## 2019-06-22 ENCOUNTER — Other Ambulatory Visit: Payer: Self-pay

## 2019-06-22 ENCOUNTER — Ambulatory Visit
Admission: RE | Admit: 2019-06-22 | Discharge: 2019-06-22 | Disposition: A | Discharge status: Home / Self Care | Payer: Medicare HMO | Source: Ambulatory Visit | Attending: Radiation Oncology | Admitting: Radiation Oncology

## 2019-06-22 ENCOUNTER — Other Ambulatory Visit: Payer: Self-pay | Admitting: Medical

## 2019-06-22 DIAGNOSIS — C3412 Malignant neoplasm of upper lobe, left bronchus or lung: Secondary | ICD-10-CM | POA: Diagnosis not present

## 2019-06-22 DIAGNOSIS — F17210 Nicotine dependence, cigarettes, uncomplicated: Secondary | ICD-10-CM | POA: Diagnosis not present

## 2019-06-22 DIAGNOSIS — C3492 Malignant neoplasm of unspecified part of left bronchus or lung: Secondary | ICD-10-CM

## 2019-06-22 DIAGNOSIS — Z510 Encounter for antineoplastic radiation therapy: Secondary | ICD-10-CM | POA: Diagnosis not present

## 2019-06-22 NOTE — Telephone Encounter (Signed)
MR. Racey's sister Delphine called to review upcoming appointment scheduled so she could arrange transportation.

## 2019-06-23 DIAGNOSIS — I4891 Unspecified atrial fibrillation: Secondary | ICD-10-CM | POA: Diagnosis not present

## 2019-06-23 DIAGNOSIS — R112 Nausea with vomiting, unspecified: Secondary | ICD-10-CM | POA: Diagnosis not present

## 2019-06-23 DIAGNOSIS — R1110 Vomiting, unspecified: Secondary | ICD-10-CM | POA: Diagnosis not present

## 2019-06-23 DIAGNOSIS — K5732 Diverticulitis of large intestine without perforation or abscess without bleeding: Secondary | ICD-10-CM | POA: Diagnosis not present

## 2019-06-23 DIAGNOSIS — I5022 Chronic systolic (congestive) heart failure: Secondary | ICD-10-CM | POA: Diagnosis not present

## 2019-06-23 DIAGNOSIS — D61810 Antineoplastic chemotherapy induced pancytopenia: Secondary | ICD-10-CM | POA: Diagnosis not present

## 2019-06-23 DIAGNOSIS — K5720 Diverticulitis of large intestine with perforation and abscess without bleeding: Secondary | ICD-10-CM | POA: Diagnosis not present

## 2019-06-23 DIAGNOSIS — I2510 Atherosclerotic heart disease of native coronary artery without angina pectoris: Secondary | ICD-10-CM | POA: Diagnosis not present

## 2019-06-23 DIAGNOSIS — I10 Essential (primary) hypertension: Secondary | ICD-10-CM | POA: Diagnosis not present

## 2019-06-23 DIAGNOSIS — D61818 Other pancytopenia: Secondary | ICD-10-CM | POA: Diagnosis not present

## 2019-06-23 DIAGNOSIS — I110 Hypertensive heart disease with heart failure: Secondary | ICD-10-CM | POA: Diagnosis not present

## 2019-06-23 DIAGNOSIS — E119 Type 2 diabetes mellitus without complications: Secondary | ICD-10-CM | POA: Diagnosis not present

## 2019-06-23 DIAGNOSIS — R110 Nausea: Secondary | ICD-10-CM | POA: Diagnosis not present

## 2019-06-26 ENCOUNTER — Other Ambulatory Visit: Payer: Self-pay

## 2019-06-26 ENCOUNTER — Ambulatory Visit: Payer: Medicare HMO

## 2019-06-26 DIAGNOSIS — F17210 Nicotine dependence, cigarettes, uncomplicated: Secondary | ICD-10-CM | POA: Diagnosis not present

## 2019-06-26 DIAGNOSIS — C3492 Malignant neoplasm of unspecified part of left bronchus or lung: Secondary | ICD-10-CM | POA: Insufficient documentation

## 2019-06-26 DIAGNOSIS — C3412 Malignant neoplasm of upper lobe, left bronchus or lung: Secondary | ICD-10-CM | POA: Insufficient documentation

## 2019-06-26 DIAGNOSIS — Z510 Encounter for antineoplastic radiation therapy: Secondary | ICD-10-CM | POA: Insufficient documentation

## 2019-06-27 ENCOUNTER — Ambulatory Visit
Admission: RE | Admit: 2019-06-27 | Discharge: 2019-06-27 | Disposition: A | Discharge status: Home / Self Care | Payer: Medicare HMO | Source: Ambulatory Visit | Attending: Radiation Oncology | Admitting: Radiation Oncology

## 2019-06-27 ENCOUNTER — Inpatient Hospital Stay: Payer: Medicare HMO | Attending: Internal Medicine | Admitting: Internal Medicine

## 2019-06-27 ENCOUNTER — Ambulatory Visit: Payer: Medicare HMO

## 2019-06-27 ENCOUNTER — Encounter: Payer: Self-pay | Admitting: Internal Medicine

## 2019-06-27 ENCOUNTER — Other Ambulatory Visit: Payer: Self-pay | Admitting: Physician Assistant

## 2019-06-27 ENCOUNTER — Inpatient Hospital Stay: Payer: Medicare HMO

## 2019-06-27 ENCOUNTER — Other Ambulatory Visit: Payer: Self-pay

## 2019-06-27 ENCOUNTER — Encounter: Payer: Self-pay | Admitting: *Deleted

## 2019-06-27 VITALS — BP 90/57 | HR 116 | Temp 98.8°F | Resp 16 | Ht 67.0 in | Wt 193.8 lb

## 2019-06-27 VITALS — BP 85/56 | HR 78

## 2019-06-27 DIAGNOSIS — Z7982 Long term (current) use of aspirin: Secondary | ICD-10-CM | POA: Insufficient documentation

## 2019-06-27 DIAGNOSIS — I959 Hypotension, unspecified: Secondary | ICD-10-CM | POA: Diagnosis not present

## 2019-06-27 DIAGNOSIS — I110 Hypertensive heart disease with heart failure: Secondary | ICD-10-CM | POA: Diagnosis not present

## 2019-06-27 DIAGNOSIS — E119 Type 2 diabetes mellitus without complications: Secondary | ICD-10-CM | POA: Insufficient documentation

## 2019-06-27 DIAGNOSIS — R1319 Other dysphagia: Secondary | ICD-10-CM

## 2019-06-27 DIAGNOSIS — C3492 Malignant neoplasm of unspecified part of left bronchus or lung: Secondary | ICD-10-CM

## 2019-06-27 DIAGNOSIS — D649 Anemia, unspecified: Secondary | ICD-10-CM

## 2019-06-27 DIAGNOSIS — C3412 Malignant neoplasm of upper lobe, left bronchus or lung: Secondary | ICD-10-CM | POA: Diagnosis not present

## 2019-06-27 DIAGNOSIS — J449 Chronic obstructive pulmonary disease, unspecified: Secondary | ICD-10-CM | POA: Insufficient documentation

## 2019-06-27 DIAGNOSIS — Z791 Long term (current) use of non-steroidal anti-inflammatories (NSAID): Secondary | ICD-10-CM | POA: Diagnosis not present

## 2019-06-27 DIAGNOSIS — R000 Tachycardia, unspecified: Secondary | ICD-10-CM

## 2019-06-27 DIAGNOSIS — F17210 Nicotine dependence, cigarettes, uncomplicated: Secondary | ICD-10-CM | POA: Diagnosis not present

## 2019-06-27 DIAGNOSIS — Z79899 Other long term (current) drug therapy: Secondary | ICD-10-CM | POA: Insufficient documentation

## 2019-06-27 DIAGNOSIS — C3490 Malignant neoplasm of unspecified part of unspecified bronchus or lung: Secondary | ICD-10-CM

## 2019-06-27 DIAGNOSIS — D701 Agranulocytosis secondary to cancer chemotherapy: Secondary | ICD-10-CM

## 2019-06-27 DIAGNOSIS — Z9221 Personal history of antineoplastic chemotherapy: Secondary | ICD-10-CM | POA: Insufficient documentation

## 2019-06-27 DIAGNOSIS — E860 Dehydration: Secondary | ICD-10-CM

## 2019-06-27 DIAGNOSIS — Z7951 Long term (current) use of inhaled steroids: Secondary | ICD-10-CM | POA: Diagnosis not present

## 2019-06-27 DIAGNOSIS — Z510 Encounter for antineoplastic radiation therapy: Secondary | ICD-10-CM | POA: Diagnosis not present

## 2019-06-27 DIAGNOSIS — Z5111 Encounter for antineoplastic chemotherapy: Secondary | ICD-10-CM

## 2019-06-27 DIAGNOSIS — E785 Hyperlipidemia, unspecified: Secondary | ICD-10-CM | POA: Diagnosis not present

## 2019-06-27 DIAGNOSIS — I5022 Chronic systolic (congestive) heart failure: Secondary | ICD-10-CM | POA: Diagnosis not present

## 2019-06-27 DIAGNOSIS — Z923 Personal history of irradiation: Secondary | ICD-10-CM | POA: Insufficient documentation

## 2019-06-27 DIAGNOSIS — R1310 Dysphagia, unspecified: Secondary | ICD-10-CM | POA: Diagnosis not present

## 2019-06-27 DIAGNOSIS — R530 Neoplastic (malignant) related fatigue: Secondary | ICD-10-CM

## 2019-06-27 LAB — CMP (CANCER CENTER ONLY)
ALT: 19 U/L (ref 0–44)
AST: 21 U/L (ref 15–41)
Albumin: 3.3 g/dL — ABNORMAL LOW (ref 3.5–5.0)
Alkaline Phosphatase: 42 U/L (ref 38–126)
Anion gap: 13 (ref 5–15)
BUN: 21 mg/dL (ref 8–23)
CO2: 18 mmol/L — ABNORMAL LOW (ref 22–32)
Calcium: 7.4 mg/dL — ABNORMAL LOW (ref 8.9–10.3)
Chloride: 108 mmol/L (ref 98–111)
Creatinine: 1.63 mg/dL — ABNORMAL HIGH (ref 0.61–1.24)
GFR, Est AFR Am: 50 mL/min — ABNORMAL LOW (ref >60)
GFR, Estimated: 43 mL/min — ABNORMAL LOW (ref >60)
Glucose, Bld: 105 mg/dL — ABNORMAL HIGH (ref 70–99)
Potassium: 3.1 mmol/L — ABNORMAL LOW (ref 3.5–5.1)
Sodium: 139 mmol/L (ref 135–145)
Total Bilirubin: 1 mg/dL (ref 0.3–1.2)
Total Protein: 6.1 g/dL — ABNORMAL LOW (ref 6.5–8.1)

## 2019-06-27 LAB — CBC WITH DIFFERENTIAL (CANCER CENTER ONLY)
Abs Immature Granulocytes: 0.06 10*3/uL (ref 0.00–0.07)
Basophils Absolute: 0 10*3/uL (ref 0.0–0.1)
Basophils Relative: 1 %
Eosinophils Absolute: 0 10*3/uL (ref 0.0–0.5)
Eosinophils Relative: 1 %
HCT: 27.9 % — ABNORMAL LOW (ref 39.0–52.0)
Hemoglobin: 9.1 g/dL — ABNORMAL LOW (ref 13.0–17.0)
Immature Granulocytes: 5 %
Lymphocytes Relative: 13 %
Lymphs Abs: 0.2 10*3/uL — ABNORMAL LOW (ref 0.7–4.0)
MCH: 28.3 pg (ref 26.0–34.0)
MCHC: 32.6 g/dL (ref 30.0–36.0)
MCV: 86.9 fL (ref 80.0–100.0)
Monocytes Absolute: 0.1 10*3/uL (ref 0.1–1.0)
Monocytes Relative: 11 %
Neutro Abs: 0.9 10*3/uL — ABNORMAL LOW (ref 1.7–7.7)
Neutrophils Relative %: 69 %
Platelet Count: 92 10*3/uL — ABNORMAL LOW (ref 150–400)
RBC: 3.21 MIL/uL — ABNORMAL LOW (ref 4.22–5.81)
RDW: 17.4 % — ABNORMAL HIGH (ref 11.5–15.5)
WBC Count: 1.3 10*3/uL — ABNORMAL LOW (ref 4.0–10.5)
nRBC: 0 % (ref 0.0–0.2)

## 2019-06-27 LAB — SAMPLE TO BLOOD BANK

## 2019-06-27 MED ORDER — POTASSIUM CHLORIDE 10 MEQ/100ML IV SOLN
10.0000 meq | INTRAVENOUS | Status: DC
  Administered 2019-06-27: 10 meq via INTRAVENOUS

## 2019-06-27 MED ORDER — POTASSIUM CHLORIDE 10 MEQ/100ML IV SOLN
INTRAVENOUS | Status: AC
  Filled 2019-06-27: qty 100

## 2019-06-27 MED ORDER — SODIUM CHLORIDE 0.9 % IV SOLN
Freq: Once | INTRAVENOUS | Status: AC
  Filled 2019-06-27: qty 250

## 2019-06-27 MED ORDER — POTASSIUM CHLORIDE CRYS ER 20 MEQ PO TBCR
EXTENDED_RELEASE_TABLET | ORAL | Status: AC
  Filled 2019-06-27: qty 1

## 2019-06-27 MED ORDER — POTASSIUM CHLORIDE 10 MEQ/100ML IV SOLN: 10.0000 meq | Freq: Once | INTRAVENOUS | Status: AC

## 2019-06-27 MED ORDER — POTASSIUM CHLORIDE CRYS ER 20 MEQ PO TBCR
20.0000 meq | EXTENDED_RELEASE_TABLET | Freq: Once | ORAL | Status: AC
  Administered 2019-06-27: 20 meq via ORAL

## 2019-06-27 NOTE — Progress Notes (Signed)
[place] Telephone:([phone]) [phone]   [phone]  OFFICE PROGRESS NOTE  [name], PA-C [place] [address]  DIAGNOSIS: stage IIIc (T2 a, N3, M0) non-small cell lung cancer, squamous cell carcinoma diagnosed in October 2020 and presented with left upper lobe pleural-based mass in addition to left hilar and right paratracheal lymphadenopathy.  PRIOR THERAPY:None.  CURRENT THERAPY: Concurrent chemoradiation with weekly carboplatin for AUC of 2 and paclitaxel 45 mg/M2.  First dose was on May 08, 2019.  Status post 7 cycles.  INTERVAL HISTORY: [name] [name] 69 y.o. male returns to the clinic today for follow-up visit.  The patient is complaining of increasing fatigue and weakness as well as mild odynophagia.  His blood pressure is low today and he is still taking his blood pressure medication.  He denied having any current chest pain but has shortness of breath with exertion with mild cough and no hemoptysis.  He denied having any fever or chills.  He has no nausea, vomiting, diarrhea or constipation.  He is expected to complete this course of concurrent chemoradiation on June 28, 2018.  MEDICAL HISTORY: Past Medical History:  Diagnosis Date  . AICD (automatic cardioverter/defibrillator) present    reports no shocks  - st Jude  . Ambulates with cane   . CAD (coronary artery disease)    a. 04/2013 Cath: LM 10, LAD 40p, 69m, D1 50p, LCX 60p, 20m/100m, OM1 50, OM2 100 L-L collats, RCA 100p;  b. CABG x 5: LIMA->LAD, VG->D2, VG->RI->OM2, VG->PDA.  . Cataracts, bilateral   . Chronic systolic CHF (congestive heart failure) ([place])    a. 04/2013 Echo: EF 15-20%, diff HK, sev HK of inf and ant myocardium, dilated LA,  b. EF 20-25% with restrictive filling pattern, mod RV dysfx, mild MR (10/10/2013);  c.  Echo 12/15: EF 25-30%  . COPD (chronic obstructive pulmonary disease) ([place])   . Diabetes mellitus without complication ([place])    unsure what type  but was dx as an adult, takes no meds, check his cbg at home 2 times a week   . Emphysema   . GERD (gastroesophageal reflux disease)   . Gout   . Hyperlipidemia   . Hypertension   . Ischemic cardiomyopathy   . PVD (peripheral vascular disease) ([place])    a. (10/2013) ABIs: RIGHT 0.63, Waveforms: monophasic;  LEFT 0.78, Waveforms: monophasic  . Tobacco abuse    30+ pack-year history  . Transaminitis    a. 04/2013 Abd U/S and CT unremarkable, hepatitis panel neg -->felt to be 2/2 acute R heart failure.    ALLERGIES:  is allergic to penicillins.  MEDICATIONS:  Current Outpatient Medications  Medication Sig Dispense Refill  . albuterol (VENTOLIN HFA) 108 (90 Base) MCG/ACT inhaler Inhale 1-2 puffs into the lungs every 6 (six) hours as needed for wheezing or shortness of breath. 6.7 g 6  . allopurinol (ZYLOPRIM) 300 MG tablet Take 300 mg by mouth daily.    Marland Kitchen aspirin 81 MG chewable tablet Chew 1 tablet (81 mg total) by mouth 2 (two) times daily. 60 tablet 1  . atorvastatin (LIPITOR) 20 MG tablet Take 20 mg by mouth daily.    Marland Kitchen azelastine (OPTIVAR) 0.05 % ophthalmic solution Place 1 drop into the left eye 2 (two) times daily.   0  . b complex vitamins tablet Take 1 tablet by mouth daily.    . budesonide-formoterol (SYMBICORT) 160-4.5 MCG/ACT inhaler Inhale 2 puffs into the lungs 2 (  two) times daily. 1 Inhaler 6  . carvedilol (COREG) 6.25 MG tablet TAKE 1 AND 1/2 TABLETS BY MOUTH TWICE A DAY WITH A MEAL 60 tablet 6  . celecoxib (CELEBREX) 200 MG capsule Take 200 mg by mouth 2 (two) times daily as needed for mild pain.    Marland Kitchen colchicine (COLCRYS) 0.6 MG tablet Take 0.6 mg by mouth daily as needed (gout). At on set .012 mg gout and 0.6 mg an hour later. No more that three tablets a day    . docusate sodium (COLACE) 100 MG capsule Take 1 capsule (100 mg total) by mouth 2 (two) times daily. 60 capsule 1  . ENTRESTO 49-51 MG Take 1 tablet by mouth 2 (two) times daily.    . furosemide (LASIX) 40 MG  tablet Take 40 mg by mouth 2 (two) times daily.     . hydrALAZINE (APRESOLINE) 25 MG tablet TAKE 0.5 TABLETS (12.5 MG TOTAL) BY MOUTH 3 (THREE) TIMES DAILY. 45 tablet 3  . HYDROcodone-acetaminophen (NORCO/VICODIN) 5-325 MG tablet Take 1 tablet by mouth every 4 (four) hours as needed. 10 tablet 0  . isosorbide mononitrate (IMDUR) 30 MG 24 hr tablet TAKE 1 TABLET BY MOUTH EVERY DAY 30 tablet 3  . omeprazole (PRILOSEC) 40 MG capsule Take 40 mg by mouth daily.    . ondansetron (ZOFRAN) 4 MG tablet Take 1 tablet (4 mg total) by mouth every 6 (six) hours as needed for nausea. 20 tablet 0  . oxyCODONE-acetaminophen (PERCOCET/ROXICET) 5-325 MG tablet Take 1 tablet by mouth every 8 (eight) hours as needed for severe pain. 20 tablet 0  . Potassium Chloride ER 20 MEQ TBCR Take 20 mEq by mouth daily.     . prochlorperazine (COMPAZINE) 10 MG tablet Take 1 tablet (10 mg total) by mouth every 6 (six) hours as needed for nausea or vomiting. 30 tablet 0  . senna (SENOKOT) 8.6 MG TABS tablet Take 1 tablet (8.6 mg total) by mouth 2 (two) times daily. 120 each 0  . spironolactone (ALDACTONE) 25 MG tablet Take 0.5 tablets (12.5 mg total) by mouth daily. 45 tablet 1  . tiotropium (SPIRIVA) 18 MCG inhalation capsule Place 18 mcg into inhaler and inhale daily.    Marland Kitchen tiZANidine (ZANAFLEX) 4 MG tablet Take 4 mg by mouth every 6 (six) hours as needed for muscle spasms.      No current facility-administered medications for this visit.    SURGICAL HISTORY:  Past Surgical History:  Procedure Laterality Date  . APPENDECTOMY  as a child  . CARDIAC CATHETERIZATION  04/08/2015   Procedure: Left Heart Cath and Cors/Grafts Angiography;  Surgeon: Peter M Martinique, MD;  Location: [place] [facility];  Service: Cardiovascular;;  . COLONOSCOPY    . CORONARY ARTERY BYPASS GRAFT N/A 05/05/2013   Procedure: CORONARY ARTERY BYPASS GRAFTING (CABG) TIMES FIVE USING LEFT INTERNAL MAMMARY ARTERY AND RIGHT AND LEFT SAPHENOUS LEG VEIN HARVESTED  ENDOSCOPICALLY;  Surgeon: Melrose Nakayama, MD;  Location: [place];  Service: Open Heart Surgery;  Laterality: N/A;  . IMPLANTABLE CARDIOVERTER DEFIBRILLATOR IMPLANT N/A 07/06/2014   Procedure: St Jude IMPLANTABLE CARDIOVERTER DEFIBRILLATOR IMPLANT;  Surgeon: Deboraha Sprang, MD;  Location: [facility] CATH LAB;  Service: Cardiovascular;  Laterality: N/A;  . LEFT AND RIGHT HEART CATHETERIZATION WITH CORONARY ANGIOGRAM N/A 04/28/2013   Procedure: LEFT AND RIGHT HEART CATHETERIZATION WITH CORONARY ANGIOGRAM;  Surgeon: Burnell Blanks, MD;  Location: [facility] CATH LAB;  Service: Cardiovascular;  Laterality: N/A;  . MULTIPLE EXTRACTIONS WITH ALVEOLOPLASTY  N/A 05/03/2013   Procedure: Extraction of tooth #s 3,4,5,6,8,9,27 with alveoloplast and maxillary left osseous tuberosity reduction;  Surgeon: Lenn Cal, DDS;  [facility];  Service: Oral Surgery;  Laterality: N/A;  . TOTAL HIP ARTHROPLASTY Right 11/30/2018   Procedure: TOTAL HIP ARTHROPLASTY ANTERIOR APPROACH;  Surgeon: Rod Can, MD;  [facility];  Service: Orthopedics;  Laterality: Right;  Marland Kitchen VIDEO BRONCHOSCOPY WITH ENDOBRONCHIAL ULTRASOUND N/A 04/20/2019   Procedure: VIDEO BRONCHOSCOPY WITH ENDOBRONCHIAL ULTRASOUND;  Surgeon: Margaretha Seeds, MD;  Location: [place];  Service: Thoracic;  Laterality: N/A;    REVIEW OF SYSTEMS:  Constitutional: positive for fatigue and weight loss Eyes: negative Ears, nose, mouth, throat, and face: negative Respiratory: negative Cardiovascular: negative Gastrointestinal: positive for odynophagia Genitourinary:negative Integument/breast: negative Hematologic/lymphatic: negative Musculoskeletal:negative Neurological: negative Behavioral/Psych: negative Endocrine: negative Allergic/Immunologic: negative   PHYSICAL EXAMINATION: General appearance: alert, cooperative, fatigued and no distress Head: Normocephalic, without obvious abnormality, atraumatic Neck: no adenopathy, no JVD, supple,  symmetrical, trachea midline and thyroid not enlarged, symmetric, no tenderness/mass/nodules Lymph nodes: Cervical, supraclavicular, and axillary nodes normal. Resp: clear to auscultation bilaterally Back: symmetric, no curvature. ROM normal. No CVA tenderness. Cardio: regular rate and rhythm, S1, S2 normal, no murmur, click, rub or gallop GI: soft, non-tender; bowel sounds normal; no masses,  no organomegaly Extremities: extremities normal, atraumatic, no cyanosis or edema Neurologic: Alert and oriented X 3, normal strength and tone. Normal symmetric reflexes. Normal coordination and gait  ECOG PERFORMANCE STATUS: 1 - Symptomatic but completely ambulatory  Blood pressure (!) 77/55, pulse (!) 116, temperature 98.8 F (37.1 C), temperature source Temporal, resp. rate 16, height 5\' 7"  (1.702 m), weight 193 lb 12.8 oz (87.9 kg), SpO2 100 %.  LABORATORY DATA: Lab Results  Component Value Date   WBC 1.3 (L) 06/27/2019   HGB 9.1 (L) 06/27/2019   HCT 27.9 (L) 06/27/2019   MCV 86.9 06/27/2019   PLT 92 (L) 06/27/2019      Chemistry      Component Value Date/Time   NA 138 06/19/2019 1303   NA 141 08/31/2017 1433   K 4.1 06/19/2019 1303   CL 107 06/19/2019 1303   CO2 20 (L) 06/19/2019 1303   BUN 24 (H) 06/19/2019 1303   BUN 33 (H) 08/31/2017 1433   CREATININE 1.71 (H) 06/19/2019 1303   CREATININE 1.35 (H) 12/25/2015 1310      Component Value Date/Time   CALCIUM 7.7 (L) 06/19/2019 1303   ALKPHOS 57 06/19/2019 1303   AST 16 06/19/2019 1303   ALT 15 06/19/2019 1303   BILITOT 0.7 06/19/2019 1303       RADIOGRAPHIC STUDIES: No results found.  ASSESSMENT AND PLAN: This is a very pleasant 69 years old male with stage IIIc non-small cell lung cancer, squamous cell carcinoma presented with left upper lobe pleural-based mass in addition to left hilar and right paramediastinal lymphadenopathy. The patient is currently undergoing a course of concurrent chemoradiation with weekly  carboplatin and paclitaxel status post 7 cycles.   The patient has been tolerating his treatment well with no concerning adverse effects except for fatigue and odynophagia. He was scheduled to receive cycle #8 of chemotherapy tomorrow.  His absolute neutrophil count and total white blood count is low.  I will cancel the chemotherapy tomorrow. I will arrange for the patient to come back for follow-up visit in 3 weeks with repeat CT scan of the chest for restaging of his disease. For the hypotension and tachycardia, the patient has poor p.o.  intake and dehydration.  I will arrange for the patient to receive 1 L of normal saline in the clinic today. The patient was advised to hold his blood pressure medication if his systolic less than 754. He was advised to call immediately if he has any other concerning symptoms in the interval. The patient voices understanding of current disease status and treatment options and is in agreement with the current care plan.  All questions were answered. The patient knows to call the clinic with any problems, questions or concerns. We can certainly see the patient much sooner if necessary.  Disclaimer: This note was dictated with voice recognition software. Similar sounding words can inadvertently be transcribed and may not be corrected upon review.

## 2019-06-27 NOTE — Patient Instructions (Signed)
Hypokalemia Hypokalemia means that the amount of potassium in the blood is lower than normal. Potassium is a chemical (electrolyte) that helps regulate the amount of fluid in the body. It also stimulates muscle tightening (contraction) and helps nerves work properly. Normally, most of the body's potassium is inside cells, and only a very small amount is in the blood. Because the amount in the blood is so small, minor changes to potassium levels in the blood can be life-threatening. What are the causes? This condition may be caused by:  Antibiotic medicine.  Diarrhea or vomiting. Taking too much of a medicine that helps you have a bowel movement (laxative) can cause diarrhea and lead to hypokalemia.  Chronic kidney disease (CKD).  Medicines that help the body get rid of excess fluid (diuretics).  Eating disorders, such as bulimia.  Low magnesium levels in the body.  Sweating a lot. What are the signs or symptoms? Symptoms of this condition include:  Weakness.  Constipation.  Fatigue.  Muscle cramps.  Mental confusion.  Skipped heartbeats or irregular heartbeat (palpitations).  Tingling or numbness. How is this diagnosed? This condition is diagnosed with a blood test. How is this treated? This condition may be treated by:  Taking potassium supplements by mouth.  Adjusting the medicines that you take.  Eating more foods that contain a lot of potassium. If your potassium level is very low, you may need to get potassium through an IV and be monitored in the hospital. Follow these instructions at home:   Take over-the-counter and prescription medicines only as told by your health care provider. This includes vitamins and supplements.  Eat a healthy diet. A healthy diet includes fresh fruits and vegetables, whole grains, healthy fats, and lean proteins.  If instructed, eat more foods that contain a lot of potassium. This includes: ? Nuts, such as peanuts and  pistachios. ? Seeds, such as sunflower seeds and pumpkin seeds. ? Peas, lentils, and lima beans. ? Whole grain and bran cereals and breads. ? Fresh fruits and vegetables, such as apricots, avocado, bananas, cantaloupe, kiwi, oranges, tomatoes, asparagus, and potatoes. ? Orange juice. ? Tomato juice. ? Red meats. ? Yogurt.  Keep all follow-up visits as told by your health care provider. This is important. Contact a health care provider if you:  Have weakness that gets worse.  Feel your heart pounding or racing.  Vomit.  Have diarrhea.  Have diabetes (diabetes mellitus) and you have trouble keeping your blood sugar (glucose) in your target range. Get help right away if you:  Have chest pain.  Have shortness of breath.  Have vomiting or diarrhea that lasts for more than 2 days.  Faint. Summary  Hypokalemia means that the amount of potassium in the blood is lower than normal.  This condition is diagnosed with a blood test.  Hypokalemia may be treated by taking potassium supplements, adjusting the medicines that you take, or eating more foods that are high in potassium.  If your potassium level is very low, you may need to get potassium through an IV and be monitored in the hospital. This information is not intended to replace advice given to you by your health care provider. Make sure you discuss any questions you have with your health care provider. Document Revised: 01/19/2018 Document Reviewed: 01/19/2018 Elsevier Patient Education  [place].   Rehydration, Adult Rehydration is the replacement of body fluids and salts and minerals (electrolytes) that are lost during dehydration. Dehydration is when there is not enough  fluid or water in the body. This happens when you lose more fluids than you take in. Common causes of dehydration include:  Vomiting.  Diarrhea.  Excessive sweating, such as from heat exposure or exercise.  Taking medicines that cause the  body to lose excess fluid (diuretics).  Impaired kidney function.  Not drinking enough fluid.  Certain illnesses or infections.  Certain poorly controlled long-term (chronic) illnesses, such as diabetes, heart disease, and kidney disease.  Symptoms of mild dehydration may include thirst, dry lips and mouth, dry skin, and dizziness. Symptoms of severe dehydration may include increased heart rate, confusion, fainting, and not urinating. You can rehydrate by drinking certain fluids or getting fluids through an IV tube, as told by your health care provider. What are the risks? Generally, rehydration is safe. However, one problem that can happen is taking in too much fluid (overhydration). This is rare. If overhydration happens, it can cause an electrolyte imbalance, kidney failure, or a decrease in salt (sodium) levels in the body. How to rehydrate Follow instructions from your health care provider for rehydration. The kind of fluid you should drink and the amount you should drink depend on your condition.  If directed by your health care provider, drink an oral rehydration solution (ORS). This is a drink designed to treat dehydration that is found in pharmacies and retail stores. ? Make an ORS by following instructions on the package. ? Start by drinking small amounts, about  cup (120 mL) every 5-10 minutes. ? Slowly increase how much you drink until you have taken the amount recommended by your health care provider.  Drink enough clear fluids to keep your urine clear or pale yellow. If you were instructed to drink an ORS, finish the ORS first, then start slowly drinking other clear fluids. Drink fluids such as: ? Water. Do not drink only water. Doing that can lead to having too little sodium in your body (hyponatremia). ? Ice chips. ? Fruit juice that you have added water to (diluted juice). ? Low-calorie sports drinks.  If you are severely dehydrated, your health care provider may  recommend that you receive fluids through an IV tube in the hospital.  Do not take sodium tablets. Doing that can lead to the condition of having too much sodium in your body (hypernatremia). Eating while you rehydrate Follow instructions from your health care provider about what to eat while you rehydrate. Your health care provider may recommend that you slowly begin eating regular foods in small amounts.  Eat foods that contain a healthy balance of electrolytes, such as bananas, oranges, potatoes, tomatoes, and spinach.  Avoid foods that are greasy or contain a lot of fat or sugar.  In some cases, you may get nutrition through a feeding tube that is passed through your nose and into your stomach (nasogastric tube, or NG tube). This may be done if you have uncontrolled vomiting or diarrhea. Beverages to avoid Certain beverages may make dehydration worse. While you rehydrate, avoid:  Alcohol.  Caffeine.  Drinks that contain a lot of sugar. These include: ? High-calorie sports drinks. ? Fruit juice that is not diluted. ? Soda.  Check nutrition labels to see how much sugar or caffeine a beverage contains. Signs of dehydration recovery You may be recovering from dehydration if:  You are urinating more often than before you started rehydrating.  Your urine is clear or pale yellow.  Your energy level improves.  You vomit less frequently.  You have diarrhea less frequently.  Your appetite improves or returns to normal.  You feel less dizzy or less light-headed.  Your skin tone and color start to look more normal. Contact a health care provider if:  You continue to have symptoms of mild dehydration, such as: ? Thirst. ? Dry lips. ? Slightly dry mouth. ? Dry, warm skin. ? Dizziness.  You continue to vomit or have diarrhea. Get help right away if:  You have symptoms of dehydration that get worse.  You feel: ? Confused. ? Weak. ? Like you are going to faint.  You  have not urinated in 6-8 hours.  You have very dark urine.  You have trouble breathing.  Your heart rate while sitting still is over 100 beats a minute.  You cannot drink fluids without vomiting.  You have vomiting or diarrhea that: ? Gets worse. ? Does not go away.  You have a fever. This information is not intended to replace advice given to you by your health care provider. Make sure you discuss any questions you have with your health care provider. Document Revised: 05/21/2017 Document Reviewed: 08/02/2015 Elsevier Patient Education  2020 [place] (COVID-19) Are you at risk?  Are you at risk for the Coronavirus (COVID-19)?  To be considered HIGH RISK for Coronavirus (COVID-19), you have to meet the following criteria:  . Traveled to [place], [place], [place], [place] or [place]; or in the [place] to [place], [place], [place], or [place]; and have fever, cough, and shortness of breath within the last 2 weeks of travel OR . Been in close contact with a person diagnosed with COVID-19 within the last 2 weeks and have fever, cough, and shortness of breath . IF YOU DO NOT MEET THESE CRITERIA, YOU ARE CONSIDERED LOW RISK FOR COVID-19.  What to do if you are HIGH RISK for COVID-19?  Marland Kitchen If you are having a medical emergency, call 911. . Seek medical care right away. Before you go to a doctor's office, urgent care or emergency department, call ahead and tell them about your recent travel, contact with someone diagnosed with COVID-19, and your symptoms. You should receive instructions from your physician's office regarding next steps of care.  . When you arrive at healthcare provider, tell the healthcare staff immediately you have returned from visiting [place], [place], [place], [place] or [place]; or traveled in the [place] to [place], [place], [place], or [place]; in the last two weeks or you have been in close contact with a person diagnosed  with COVID-19 in the last 2 weeks.   . Tell the health care staff about your symptoms: fever, cough and shortness of breath. . After you have been seen by a medical provider, you will be either: o Tested for (COVID-19) and discharged home on quarantine except to seek medical care if symptoms worsen, and asked to  - Stay home and avoid contact with others until you get your results (4-5 days)  - Avoid travel on public transportation if possible (such as bus, train, or airplane) or o Sent to the Emergency Department by EMS for evaluation, COVID-19 testing, and possible admission depending on your condition and test results.  What to do if you are LOW RISK for COVID-19?  Reduce your risk of any infection by using the same precautions used for avoiding the common cold or flu:  Marland Kitchen Wash your hands often with soap and warm water for at least 20 seconds.  If soap and water  are not readily available, use an alcohol-based hand sanitizer with at least 60% alcohol.  . If coughing or sneezing, cover your mouth and nose by coughing or sneezing into the elbow areas of your shirt or coat, into a tissue or into your sleeve (not your hands). . Avoid shaking hands with others and consider head nods or verbal greetings only. . Avoid touching your eyes, nose, or mouth with unwashed hands.  . Avoid close contact with people who are sick. . Avoid places or events with large numbers of people in one location, like concerts or sporting events. . Carefully consider travel plans you have or are making. . If you are planning any travel outside or inside the [place], visit the CDC's Travelers' Health webpage for the latest health notices. . If you have some symptoms but not all symptoms, continue to monitor at home and seek medical attention if your symptoms worsen. . If you are having a medical emergency, call 911.   [place] / e-Visit:  [email or web address]         [facility]: [facility]: [phone]                   [facility]: [phone]

## 2019-06-27 NOTE — Progress Notes (Signed)
Oncology Nurse Navigator Documentation  Oncology Nurse Navigator Flowsheets 06/27/2019  Abnormal Finding Date -  Confirmed Diagnosis Date -  Diagnosis Status -  Planned Course of Treatment -  Phase of Treatment -  Chemo/Radiation Concurrent Actual Start Date: -  Navigator Follow Up Date: 06/27/2019  Navigator Follow Up Reason: Follow-up Appointment  Navigation Complete Date: 06/27/2019  Post Navigation: Continue to Follow Patient? No  Reason Not Navigating Patient: Patient On Maintenance Chemotherapy  Navigator Location [facility]  Referral Date to RadOnc/MedOnc -  Navigator Encounter Type Clinic/MDC/I followed up with [name] [name] in clinic today.  I help to educate on treatment plan.  Patient has completed treatment and is now on observation.  No barriers identified at this time.   Telephone -  Treatment Initiated Date -  Patient Visit Type -  Treatment Phase Treatment  Barriers/Navigation Needs Education  Education Other  Interventions Education  Acuity Level 2-Minimal Needs (1-2 Barriers Identified)  Coordination of Care -  Education Method Verbal  Time Spent with Patient 15

## 2019-06-27 NOTE — Progress Notes (Signed)
No chemo today. Per Cassie, patient is to receive potassium chloride 10 mEq IV x1 and potassium chloride 20 mEq PO x1 due to limited infusion time. This is along with his fluids.   Demetrius Charity, PharmD, [facility] Phone: [phone] 06/27/2019

## 2019-06-28 ENCOUNTER — Ambulatory Visit (INDEPENDENT_AMBULATORY_CARE_PROVIDER_SITE_OTHER): Payer: Medicare HMO | Admitting: *Deleted

## 2019-06-28 ENCOUNTER — Ambulatory Visit
Admission: RE | Admit: 2019-06-28 | Discharge: 2019-06-28 | Disposition: A | Discharge status: Home / Self Care | Payer: Medicare HMO | Source: Ambulatory Visit | Attending: Radiation Oncology | Admitting: Radiation Oncology

## 2019-06-28 ENCOUNTER — Other Ambulatory Visit: Payer: Self-pay | Admitting: Radiation Oncology

## 2019-06-28 ENCOUNTER — Telehealth: Payer: Self-pay | Admitting: Radiation Oncology

## 2019-06-28 ENCOUNTER — Ambulatory Visit (HOSPITAL_COMMUNITY)
Admission: RE | Admit: 2019-06-28 | Discharge: 2019-06-28 | Disposition: A | Discharge status: Home / Self Care | Payer: Medicare HMO | Source: Ambulatory Visit | Attending: Radiation Oncology | Admitting: Radiation Oncology

## 2019-06-28 ENCOUNTER — Telehealth: Payer: Self-pay | Admitting: Internal Medicine

## 2019-06-28 ENCOUNTER — Encounter (HOSPITAL_COMMUNITY): Payer: Self-pay

## 2019-06-28 ENCOUNTER — Ambulatory Visit: Payer: Medicare HMO

## 2019-06-28 ENCOUNTER — Other Ambulatory Visit (HOSPITAL_COMMUNITY): Payer: Self-pay | Admitting: Cardiology

## 2019-06-28 ENCOUNTER — Inpatient Hospital Stay: Payer: Medicare HMO

## 2019-06-28 ENCOUNTER — Other Ambulatory Visit: Payer: Self-pay

## 2019-06-28 ENCOUNTER — Encounter: Payer: Self-pay | Admitting: Nutrition

## 2019-06-28 DIAGNOSIS — C3490 Malignant neoplasm of unspecified part of unspecified bronchus or lung: Secondary | ICD-10-CM | POA: Insufficient documentation

## 2019-06-28 DIAGNOSIS — R0602 Shortness of breath: Secondary | ICD-10-CM | POA: Diagnosis not present

## 2019-06-28 DIAGNOSIS — C3412 Malignant neoplasm of upper lobe, left bronchus or lung: Secondary | ICD-10-CM | POA: Diagnosis not present

## 2019-06-28 DIAGNOSIS — Z510 Encounter for antineoplastic radiation therapy: Secondary | ICD-10-CM | POA: Diagnosis not present

## 2019-06-28 DIAGNOSIS — I255 Ischemic cardiomyopathy: Secondary | ICD-10-CM | POA: Diagnosis not present

## 2019-06-28 DIAGNOSIS — C3492 Malignant neoplasm of unspecified part of left bronchus or lung: Secondary | ICD-10-CM

## 2019-06-28 DIAGNOSIS — F17210 Nicotine dependence, cigarettes, uncomplicated: Secondary | ICD-10-CM | POA: Diagnosis not present

## 2019-06-28 DIAGNOSIS — R05 Cough: Secondary | ICD-10-CM | POA: Diagnosis not present

## 2019-06-28 DIAGNOSIS — R410 Disorientation, unspecified: Secondary | ICD-10-CM | POA: Diagnosis not present

## 2019-06-28 HISTORY — DX: Malignant (primary) neoplasm, unspecified: C80.1

## 2019-06-28 MED ORDER — SODIUM CHLORIDE 0.9 % IV SOLN
INTRAVENOUS | Status: DC
  Filled 2019-06-28 (×2): qty 250

## 2019-06-28 MED ORDER — SUCRALFATE 1 G PO TABS: 1.0000 g | ORAL_TABLET | Freq: Three times a day (TID) | ORAL | 2 refills | Status: AC

## 2019-06-28 NOTE — Progress Notes (Signed)
Provided 1 complementary case of Ensure Enlive. 

## 2019-06-28 NOTE — Telephone Encounter (Signed)
Scheduled per los. Called and spoke with patient. Confirmed appt 

## 2019-06-28 NOTE — Progress Notes (Signed)
Patient had two bouts of diarrhea during transfusion. Patient required assistance to and from the restroom both times because of his unsteady gait. Infusion of 500 cc normal saline over 1 hour complete. Patient reports he feels a little better. Patient assisted into wheelchair. Case of ensure placed in his lap. Encouraged patient to mix ensure with ice cream or chocolate syrup to make it more enjoyable. Patient verbalized understanding. Patient alert and oriented x 3. No distress noted. Patient wheeled to radiology with right wrist IV still in place. Verdis Frederickson, scheduler, in radiology requested IV be left in place and committed to ensuring radiology staff removed it before patient is discharged. Also, instructed patient his IV should be removed after his scans before he goes home. Patient verbalized understanding.

## 2019-06-28 NOTE — Telephone Encounter (Signed)
Phoned patient's sister, Delphine, as requested by Shona Simpson, PA-C. Explained that her brother's lungs do not appear infected per today's scan. I explained it has been recommended he begin taking either mucinex or delsym otc for his cough. Delphine stated, "I am in the pharmacy right now and I will pick him up some." Explained that the scan of his head though limited looked ok. Advised that if he complains of or she notices progressive confusion, headache, seizures or loss of function to contact this RN. Delphine wrote down my direct number in our prior conversation today. She verbalized understanding of all reviewed during this conversation and appreciation for the call.

## 2019-06-28 NOTE — Telephone Encounter (Signed)
Phoned patient's sister, Delphine, for assistance with patient and his medications. Explained the patient's bp has been running low and he continues to take his bp medication despite direction from Dr. Julien Nordmann and Shona Simpson, PA-C to hold if systolic is less than 628. Delphine confirms the patient has a bp machine and knows how to use it. Delphine explains that her brother's medications are prepared for him in advance and he is probably unsure of which one is his bp med. She committed to visiting him tonight and helping him resolved this issue.   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 Explained carafate has been escribed. Explained he should crush the Carafate tablet, mix it in 15 ml of warm water, and drink 15 minutes before each meal and at bedtime. She verbalized understanding via [place].   Explained he has lost 20 lb since starting chemoradiation. Explained he was sent home today with a case of Ensure. Explained he can add ice cream or syrups to the ensure to make them more desirable. She verbalized understanding and appreciation of this.   Explained her brother was sent for a chest xray and head scan today and that Dr. Ida Rogue staff would be in touch with the results. She questioned follow up. This RN explained that Shona Simpson, PA-C will call approximately one month from completion to check in on his status and explain intent for future follow ups. Delphine requested Bryson Ha call her when the time comes for the follow up call. Delphine understands this RN will relay this request.

## 2019-06-28 NOTE — Progress Notes (Addendum)
Received patient in the clinic following radiation treatment today. Noted that chemotherapy (carboplatin/paclitaxel) was held yesterday due to low counts. Patient was scheduled to receive eighth chemo treatment tomorrow but that has also been cancelled by Dr. Julien Nordmann thus patient is now under observation with med onc. Patient received one liter of normal saline yesterday under the care of Dr. Julien Nordmann. Dr. Julien Nordmann advised patient to hold bp medications if systolic pressure is less than 100. Patient reports having the means to check is bp but failed to do so this morning. Patient endorses taking bp medication. Attempted to reeducate patient about need to check bp before taking bp medication. Explained his low bp could be a contributing factor to why he is weak, dizzy and stumbling. Patient verbalized understanding.   Patient complains today of feeling weak and fatigue. Patient states, "I can't keep any food down." Part of radiation treatment field noted to be over the carina thus esophagitis is most likely part of his issue here. Patient denies chest pain. No skin changes noted within treatment field. Patient endorses using sonafine as directed. Dry cough noted. Denies shortness of breath at rest and mild with exertion. Denies hemoptysis. Denies diarrhea or constipation.   Therapist report the patient's status has declined significantly this week. They report his gait is not steady. This RN noted the patient has lost 20 lb since 05/12/2019.   BP left arm sitting 83/61. BP right arm standing 73/61. Heart rate 106.

## 2019-06-29 ENCOUNTER — Other Ambulatory Visit: Payer: Self-pay

## 2019-06-29 ENCOUNTER — Encounter: Payer: Self-pay | Admitting: Radiation Oncology

## 2019-06-29 ENCOUNTER — Ambulatory Visit
Admission: RE | Admit: 2019-06-29 | Discharge: 2019-06-29 | Disposition: A | Discharge status: Home / Self Care | Payer: Medicare HMO | Source: Ambulatory Visit | Attending: Radiation Oncology | Admitting: Radiation Oncology

## 2019-06-29 DIAGNOSIS — C3492 Malignant neoplasm of unspecified part of left bronchus or lung: Secondary | ICD-10-CM | POA: Diagnosis not present

## 2019-06-29 DIAGNOSIS — Z510 Encounter for antineoplastic radiation therapy: Secondary | ICD-10-CM | POA: Diagnosis not present

## 2019-06-29 DIAGNOSIS — F17210 Nicotine dependence, cigarettes, uncomplicated: Secondary | ICD-10-CM | POA: Diagnosis not present

## 2019-06-29 DIAGNOSIS — C3412 Malignant neoplasm of upper lobe, left bronchus or lung: Secondary | ICD-10-CM | POA: Diagnosis not present

## 2019-06-29 LAB — CUP PACEART REMOTE DEVICE CHECK
Battery Remaining Longevity: 58 mo
Battery Remaining Percentage: 57 %
Battery Voltage: 2.92 V
Brady Statistic RV Percent Paced: 1 %
Date Time Interrogation Session: 20210107104421
HighPow Impedance: 70 Ohm
HighPow Impedance: 70 Ohm
Implantable Lead Implant Date: 20160115
Implantable Lead Location: 753860
Implantable Lead Model: 7122
Implantable Pulse Generator Implant Date: 20160115
Lead Channel Impedance Value: 340 Ohm
Lead Channel Pacing Threshold Amplitude: 0.75 V
Lead Channel Pacing Threshold Pulse Width: 0.5 ms
Lead Channel Sensing Intrinsic Amplitude: 5.4 mV
Lead Channel Setting Pacing Amplitude: 2.5 V
Lead Channel Setting Pacing Pulse Width: 0.5 ms
Lead Channel Setting Sensing Sensitivity: 0.5 mV
Pulse Gen Serial Number: 7233603

## 2019-06-30 ENCOUNTER — Ambulatory Visit (INDEPENDENT_AMBULATORY_CARE_PROVIDER_SITE_OTHER): Payer: Medicare HMO | Admitting: Sports Medicine

## 2019-06-30 ENCOUNTER — Other Ambulatory Visit: Payer: Self-pay

## 2019-06-30 ENCOUNTER — Encounter: Payer: Self-pay | Admitting: Sports Medicine

## 2019-06-30 DIAGNOSIS — M79676 Pain in unspecified toe(s): Secondary | ICD-10-CM | POA: Diagnosis not present

## 2019-06-30 DIAGNOSIS — I739 Peripheral vascular disease, unspecified: Secondary | ICD-10-CM | POA: Diagnosis not present

## 2019-06-30 DIAGNOSIS — B351 Tinea unguium: Secondary | ICD-10-CM | POA: Diagnosis not present

## 2019-06-30 DIAGNOSIS — L853 Xerosis cutis: Secondary | ICD-10-CM

## 2019-06-30 DIAGNOSIS — E1140 Type 2 diabetes mellitus with diabetic neuropathy, unspecified: Secondary | ICD-10-CM

## 2019-06-30 NOTE — Progress Notes (Signed)
Patient ID: [name] [name], male   DOB: 05-06-1951, 69 y.o.   MRN: [id number]  Subjective: [name] [name] is a 69 y.o. male patient with history of type 2 diabetes who returns to office today complaining of long, painful nails  while ambulating in shoes, unable to trim. Patient states that the glucose reading was recorded he does not know what it is and saw PCP last month reports that he is recovering from cancer as well and has a difficult time eating.  No other problems.   Patient Active Problem List   Diagnosis Date Noted  . Dehydration 06/27/2019  . Primary malignant neoplasm of bronchus of left upper lobe ([place]) 05/24/2019  . Stage III squamous cell carcinoma of left lung ([place]) 04/27/2019  . Encounter for antineoplastic chemotherapy 04/27/2019  . Goals of care, counseling/discussion 04/27/2019  . Avascular necrosis of bone of right hip ([place]) 11/30/2018  . Avascular necrosis of hip, right ([place]) 11/30/2018  . Chest pain 04/08/2015  . Abnormal nuclear stress test 04/08/2015  . Ischemic cardiomyopathy 07/06/2014  . Lumbago 03/13/2014  . Special screening for malignant neoplasms, colon 02/09/2014  . Normocytic anemia 02/09/2014  . Pleural effusion on left 11/28/2013  . Atherosclerosis of native arteries of extremity with intermittent claudication ([place]) 11/07/2013  . Weakness of both legs 11/07/2013  . Smoker 09/19/2013  . Numbness in right leg/ Foot 06/14/2013  . Pain in limb-Right Leg/foot 06/14/2013  . Atherosclerosis of native arteries of the extremities with ulceration(440.23) 06/14/2013  . Chronic systolic heart failure ([place]) 05/29/2013  . Atrial fibrillation ([place]) 05/29/2013  . Chest pain, mid sternal 05/19/2013  . S/P CABG x 5 05/08/2013  . Right foot ulcer ([place]) 05/04/2013  . Coronary atherosclerosis of native coronary artery 04/28/2013  . Acute systolic heart failure ([place]) 04/28/2013  . Abnormal blood chemistry 12/30/2012  . Abnormal LFTs 12/30/2012  . Aspiration pneumonia ([place])  12/30/2012  . Chronic congestive heart failure ([place]) 12/27/2012  . Leg weakness 12/26/2012  . Pneumonia 05/18/2011  . Gout 05/17/2011  . Respiratory distress, acute 05/17/2011  . Hypertension 05/17/2011  . COPD (chronic obstructive pulmonary disease) ([place]) 05/17/2011  . CHF, acute ([place]) 05/17/2011   Current Outpatient Medications on File Prior to Visit  Medication Sig Dispense Refill  . albuterol (VENTOLIN HFA) 108 (90 Base) MCG/ACT inhaler Inhale 1-2 puffs into the lungs every 6 (six) hours as needed for wheezing or shortness of breath. 6.7 g 6  . allopurinol (ZYLOPRIM) 300 MG tablet Take 300 mg by mouth daily.    Marland Kitchen aspirin 81 MG chewable tablet Chew 1 tablet (81 mg total) by mouth 2 (two) times daily. 60 tablet 1  . atorvastatin (LIPITOR) 20 MG tablet Take 20 mg by mouth daily.    Marland Kitchen azelastine (OPTIVAR) 0.05 % ophthalmic solution Place 1 drop into the left eye 2 (two) times daily.   0  . b complex vitamins tablet Take 1 tablet by mouth daily.    . budesonide-formoterol (SYMBICORT) 160-4.5 MCG/ACT inhaler Inhale 2 puffs into the lungs 2 (two) times daily. 1 Inhaler 6  . carvedilol (COREG) 6.25 MG tablet TAKE 1 AND 1/2 TABLETS BY MOUTH TWICE A DAY WITH A MEAL 60 tablet 6  . celecoxib (CELEBREX) 200 MG capsule Take 200 mg by mouth 2 (two) times daily as needed for mild pain.    Marland Kitchen colchicine (COLCRYS) 0.6 MG tablet Take 0.6 mg by mouth daily as needed (gout). At on set .012 mg gout and 0.6 mg an hour later. No  more that three tablets a day    . docusate sodium (COLACE) 100 MG capsule Take 1 capsule (100 mg total) by mouth 2 (two) times daily. 60 capsule 1  . ENTRESTO 49-51 MG Take 1 tablet by mouth 2 (two) times daily.    . furosemide (LASIX) 40 MG tablet Take 40 mg by mouth 2 (two) times daily.     . hydrALAZINE (APRESOLINE) 25 MG tablet TAKE 0.5 TABLETS (12.5 MG TOTAL) BY MOUTH 3 (THREE) TIMES DAILY. 45 tablet 3  . HYDROcodone-acetaminophen (NORCO/VICODIN) 5-325 MG tablet Take 1 tablet by  mouth every 4 (four) hours as needed. 10 tablet 0  . isosorbide mononitrate (IMDUR) 30 MG 24 hr tablet TAKE 1 TABLET BY MOUTH EVERY DAY 30 tablet 3  . omeprazole (PRILOSEC) 40 MG capsule Take 40 mg by mouth daily.    . ondansetron (ZOFRAN) 4 MG tablet Take 1 tablet (4 mg total) by mouth every 6 (six) hours as needed for nausea. 20 tablet 0  . oxyCODONE-acetaminophen (PERCOCET/ROXICET) 5-325 MG tablet Take 1 tablet by mouth every 8 (eight) hours as needed for severe pain. 20 tablet 0  . Potassium Chloride ER 20 MEQ TBCR Take 20 mEq by mouth daily.     . prochlorperazine (COMPAZINE) 10 MG tablet Take 1 tablet (10 mg total) by mouth every 6 (six) hours as needed for nausea or vomiting. 30 tablet 0  . senna (SENOKOT) 8.6 MG TABS tablet Take 1 tablet (8.6 mg total) by mouth 2 (two) times daily. 120 each 0  . spironolactone (ALDACTONE) 25 MG tablet Take 0.5 tablets (12.5 mg total) by mouth daily. 45 tablet 1  . sucralfate (CARAFATE) 1 g tablet Take 1 tablet (1 g total) by mouth 4 (four) times daily -  with meals and at bedtime. 5 min before meals for radiation induced esophagitis 120 tablet 2  . tiotropium (SPIRIVA) 18 MCG inhalation capsule Place 18 mcg into inhaler and inhale daily.    Marland Kitchen tiZANidine (ZANAFLEX) 4 MG tablet Take 4 mg by mouth every 6 (six) hours as needed for muscle spasms.      No current facility-administered medications on file prior to visit.   Allergies  Allergen Reactions  . Penicillins Nausea And Vomiting, Swelling and Other (See Comments)    Per Dr Audria Nine Via phone 12/27/12 0400 SWELLING REACTION UNSPECIFIED    Objective: General: Patient is awake, alert, and oriented x 3 and in no acute distress.  Integument: Skin is warm, dry and supple bilateral. Continued  xerosis bilateral, slowly improving. Nails are tender, long, thickened and dystrophic with subungual debris, consistent with onychomycosis, 1-5 bilateral. No signs of infection.  Minimal callus to 1st toes.   Remaining integument unremarkable.  Vasculature:  Dorsalis Pedis pulse 1/4 bilateral. Posterior Tibial pulse  0/4 bilateral.  Capillary fill time <3 sec 1-5 bilateral. Scant hair growth to the level of the digits. Temperature gradient within normal limits. No varicosities present bilateral. Mild trace edema present bilateral ankles.   Neurology: The patient has diminished sensation measured with a 5.07/10g Semmes Weinstein Monofilament at all pedal sites bilateral . Vibratory sensation diminished bilateral with tuning fork. No Babinski sign present bilateral.   Musculoskeletal: Asymptomatioc pes planus and bunion pedal deformities noted bilateral. Muscular strength 5/5 in all lower extremity muscular groups bilateral without pain or limitation on range of motion. No tenderness with calf compression bilateral.  Assessment and Plan: Problem List Items Addressed This Visit      Other   Pain  in limb-Right Leg/foot    Other Visit Diagnoses    Dermatophytosis of nail    -  Primary   Type 2 diabetes, controlled, with neuropathy ([place])       Peripheral vascular disease ([place])       Xerosis of skin         -Patient seen and evaluated -Re-Discussed and educated patient on diabetic foot care, especially with regards to the vascular, neurological and musculoskeletal systems.  -Mechanically debrided all nails 1-5 bilateral using sterile nail nipper and filed with dremel without incident  -Encouraged daily skin emollients for dry skin -Return in 3 months for at risk foot care -Patient advised to call the office if any problems or questions arise in the meantime.  Landis Martins, DPM

## 2019-07-01 DIAGNOSIS — J449 Chronic obstructive pulmonary disease, unspecified: Secondary | ICD-10-CM | POA: Diagnosis not present

## 2019-07-01 DIAGNOSIS — R531 Weakness: Secondary | ICD-10-CM | POA: Diagnosis not present

## 2019-07-01 DIAGNOSIS — I472 Ventricular tachycardia: Secondary | ICD-10-CM | POA: Diagnosis not present

## 2019-07-01 DIAGNOSIS — R069 Unspecified abnormalities of breathing: Secondary | ICD-10-CM | POA: Diagnosis not present

## 2019-07-01 DIAGNOSIS — R0689 Other abnormalities of breathing: Secondary | ICD-10-CM | POA: Diagnosis not present

## 2019-07-01 DIAGNOSIS — R0789 Other chest pain: Secondary | ICD-10-CM | POA: Diagnosis not present

## 2019-07-01 DIAGNOSIS — Z20822 Contact with and (suspected) exposure to covid-19: Secondary | ICD-10-CM | POA: Diagnosis not present

## 2019-07-01 DIAGNOSIS — E785 Hyperlipidemia, unspecified: Secondary | ICD-10-CM | POA: Diagnosis not present

## 2019-07-01 DIAGNOSIS — I5020 Unspecified systolic (congestive) heart failure: Secondary | ICD-10-CM | POA: Diagnosis not present

## 2019-07-01 DIAGNOSIS — I4891 Unspecified atrial fibrillation: Secondary | ICD-10-CM | POA: Diagnosis not present

## 2019-07-01 DIAGNOSIS — R7989 Other specified abnormal findings of blood chemistry: Secondary | ICD-10-CM | POA: Diagnosis not present

## 2019-07-01 DIAGNOSIS — Z720 Tobacco use: Secondary | ICD-10-CM | POA: Diagnosis not present

## 2019-07-01 DIAGNOSIS — I2510 Atherosclerotic heart disease of native coronary artery without angina pectoris: Secondary | ICD-10-CM | POA: Diagnosis not present

## 2019-07-01 DIAGNOSIS — I255 Ischemic cardiomyopathy: Secondary | ICD-10-CM | POA: Diagnosis not present

## 2019-07-01 DIAGNOSIS — R000 Tachycardia, unspecified: Secondary | ICD-10-CM | POA: Diagnosis not present

## 2019-07-01 DIAGNOSIS — R0602 Shortness of breath: Secondary | ICD-10-CM | POA: Diagnosis not present

## 2019-07-02 DIAGNOSIS — E139 Other specified diabetes mellitus without complications: Secondary | ICD-10-CM

## 2019-07-02 DIAGNOSIS — I4891 Unspecified atrial fibrillation: Secondary | ICD-10-CM | POA: Diagnosis not present

## 2019-07-02 DIAGNOSIS — R0789 Other chest pain: Secondary | ICD-10-CM | POA: Diagnosis not present

## 2019-07-02 DIAGNOSIS — R7989 Other specified abnormal findings of blood chemistry: Secondary | ICD-10-CM | POA: Diagnosis not present

## 2019-07-02 DIAGNOSIS — I2510 Atherosclerotic heart disease of native coronary artery without angina pectoris: Secondary | ICD-10-CM | POA: Diagnosis not present

## 2019-07-02 DIAGNOSIS — I472 Ventricular tachycardia: Secondary | ICD-10-CM | POA: Diagnosis not present

## 2019-07-02 DIAGNOSIS — J449 Chronic obstructive pulmonary disease, unspecified: Secondary | ICD-10-CM | POA: Diagnosis not present

## 2019-07-02 DIAGNOSIS — R0602 Shortness of breath: Secondary | ICD-10-CM | POA: Diagnosis not present

## 2019-07-02 DIAGNOSIS — E785 Hyperlipidemia, unspecified: Secondary | ICD-10-CM | POA: Diagnosis not present

## 2019-07-02 DIAGNOSIS — R531 Weakness: Secondary | ICD-10-CM | POA: Diagnosis not present

## 2019-07-02 DIAGNOSIS — I5020 Unspecified systolic (congestive) heart failure: Secondary | ICD-10-CM | POA: Diagnosis not present

## 2019-07-02 DIAGNOSIS — I255 Ischemic cardiomyopathy: Secondary | ICD-10-CM | POA: Diagnosis not present

## 2019-07-02 DIAGNOSIS — Z720 Tobacco use: Secondary | ICD-10-CM

## 2019-07-03 DIAGNOSIS — I2510 Atherosclerotic heart disease of native coronary artery without angina pectoris: Secondary | ICD-10-CM | POA: Diagnosis not present

## 2019-07-03 DIAGNOSIS — I25810 Atherosclerosis of coronary artery bypass graft(s) without angina pectoris: Secondary | ICD-10-CM | POA: Diagnosis not present

## 2019-07-03 DIAGNOSIS — Z720 Tobacco use: Secondary | ICD-10-CM | POA: Diagnosis not present

## 2019-07-03 DIAGNOSIS — R7989 Other specified abnormal findings of blood chemistry: Secondary | ICD-10-CM | POA: Diagnosis not present

## 2019-07-03 DIAGNOSIS — R0989 Other specified symptoms and signs involving the circulatory and respiratory systems: Secondary | ICD-10-CM | POA: Diagnosis not present

## 2019-07-03 DIAGNOSIS — J449 Chronic obstructive pulmonary disease, unspecified: Secondary | ICD-10-CM | POA: Diagnosis not present

## 2019-07-03 DIAGNOSIS — J9600 Acute respiratory failure, unspecified whether with hypoxia or hypercapnia: Secondary | ICD-10-CM | POA: Diagnosis not present

## 2019-07-03 DIAGNOSIS — I130 Hypertensive heart and chronic kidney disease with heart failure and stage 1 through stage 4 chronic kidney disease, or unspecified chronic kidney disease: Secondary | ICD-10-CM | POA: Diagnosis not present

## 2019-07-03 DIAGNOSIS — E785 Hyperlipidemia, unspecified: Secondary | ICD-10-CM | POA: Diagnosis not present

## 2019-07-03 DIAGNOSIS — I5033 Acute on chronic diastolic (congestive) heart failure: Secondary | ICD-10-CM | POA: Diagnosis not present

## 2019-07-03 DIAGNOSIS — R531 Weakness: Secondary | ICD-10-CM | POA: Diagnosis not present

## 2019-07-03 DIAGNOSIS — E139 Other specified diabetes mellitus without complications: Secondary | ICD-10-CM | POA: Diagnosis not present

## 2019-07-03 DIAGNOSIS — R32 Unspecified urinary incontinence: Secondary | ICD-10-CM | POA: Diagnosis not present

## 2019-07-03 DIAGNOSIS — I4891 Unspecified atrial fibrillation: Secondary | ICD-10-CM | POA: Diagnosis not present

## 2019-07-03 DIAGNOSIS — R0789 Other chest pain: Secondary | ICD-10-CM | POA: Diagnosis not present

## 2019-07-03 DIAGNOSIS — I5020 Unspecified systolic (congestive) heart failure: Secondary | ICD-10-CM | POA: Diagnosis not present

## 2019-07-03 DIAGNOSIS — C3490 Malignant neoplasm of unspecified part of unspecified bronchus or lung: Secondary | ICD-10-CM | POA: Diagnosis not present

## 2019-07-03 DIAGNOSIS — I472 Ventricular tachycardia: Secondary | ICD-10-CM | POA: Diagnosis not present

## 2019-07-03 DIAGNOSIS — N183 Chronic kidney disease, stage 3 unspecified: Secondary | ICD-10-CM | POA: Diagnosis not present

## 2019-07-03 DIAGNOSIS — I255 Ischemic cardiomyopathy: Secondary | ICD-10-CM | POA: Diagnosis not present

## 2019-07-03 DIAGNOSIS — R0602 Shortness of breath: Secondary | ICD-10-CM | POA: Diagnosis not present

## 2019-07-04 DIAGNOSIS — I472 Ventricular tachycardia: Secondary | ICD-10-CM

## 2019-07-04 DIAGNOSIS — I739 Peripheral vascular disease, unspecified: Secondary | ICD-10-CM

## 2019-07-08 DIAGNOSIS — I255 Ischemic cardiomyopathy: Secondary | ICD-10-CM

## 2019-07-08 DIAGNOSIS — I2510 Atherosclerotic heart disease of native coronary artery without angina pectoris: Secondary | ICD-10-CM

## 2019-07-08 DIAGNOSIS — N183 Chronic kidney disease, stage 3 unspecified: Secondary | ICD-10-CM

## 2019-07-08 DIAGNOSIS — I5020 Unspecified systolic (congestive) heart failure: Secondary | ICD-10-CM

## 2019-07-08 DIAGNOSIS — I4891 Unspecified atrial fibrillation: Secondary | ICD-10-CM

## 2019-07-10 DIAGNOSIS — R32 Unspecified urinary incontinence: Secondary | ICD-10-CM | POA: Diagnosis not present

## 2019-07-12 DIAGNOSIS — I5020 Unspecified systolic (congestive) heart failure: Secondary | ICD-10-CM | POA: Diagnosis not present

## 2019-07-12 DIAGNOSIS — R7989 Other specified abnormal findings of blood chemistry: Secondary | ICD-10-CM | POA: Diagnosis not present

## 2019-07-12 DIAGNOSIS — Z79899 Other long term (current) drug therapy: Secondary | ICD-10-CM | POA: Diagnosis not present

## 2019-07-12 DIAGNOSIS — I4891 Unspecified atrial fibrillation: Secondary | ICD-10-CM | POA: Diagnosis not present

## 2019-07-12 DIAGNOSIS — R0989 Other specified symptoms and signs involving the circulatory and respiratory systems: Secondary | ICD-10-CM | POA: Diagnosis not present

## 2019-07-12 DIAGNOSIS — R531 Weakness: Secondary | ICD-10-CM | POA: Diagnosis not present

## 2019-07-12 DIAGNOSIS — E785 Hyperlipidemia, unspecified: Secondary | ICD-10-CM | POA: Diagnosis not present

## 2019-07-12 DIAGNOSIS — E139 Other specified diabetes mellitus without complications: Secondary | ICD-10-CM | POA: Diagnosis not present

## 2019-07-12 DIAGNOSIS — D649 Anemia, unspecified: Secondary | ICD-10-CM | POA: Diagnosis not present

## 2019-07-12 DIAGNOSIS — R0602 Shortness of breath: Secondary | ICD-10-CM | POA: Diagnosis not present

## 2019-07-12 DIAGNOSIS — I2510 Atherosclerotic heart disease of native coronary artery without angina pectoris: Secondary | ICD-10-CM | POA: Diagnosis not present

## 2019-07-12 DIAGNOSIS — I509 Heart failure, unspecified: Secondary | ICD-10-CM | POA: Diagnosis not present

## 2019-07-12 DIAGNOSIS — Z20828 Contact with and (suspected) exposure to other viral communicable diseases: Secondary | ICD-10-CM | POA: Diagnosis not present

## 2019-07-12 DIAGNOSIS — R262 Difficulty in walking, not elsewhere classified: Secondary | ICD-10-CM | POA: Diagnosis not present

## 2019-07-12 DIAGNOSIS — J9600 Acute respiratory failure, unspecified whether with hypoxia or hypercapnia: Secondary | ICD-10-CM | POA: Diagnosis not present

## 2019-07-12 DIAGNOSIS — Z1159 Encounter for screening for other viral diseases: Secondary | ICD-10-CM | POA: Diagnosis not present

## 2019-07-12 DIAGNOSIS — J449 Chronic obstructive pulmonary disease, unspecified: Secondary | ICD-10-CM | POA: Diagnosis not present

## 2019-07-14 ENCOUNTER — Telehealth: Payer: Self-pay | Admitting: Medical Oncology

## 2019-07-14 DIAGNOSIS — D649 Anemia, unspecified: Secondary | ICD-10-CM | POA: Diagnosis not present

## 2019-07-14 DIAGNOSIS — I4891 Unspecified atrial fibrillation: Secondary | ICD-10-CM | POA: Diagnosis not present

## 2019-07-14 DIAGNOSIS — R262 Difficulty in walking, not elsewhere classified: Secondary | ICD-10-CM | POA: Diagnosis not present

## 2019-07-14 DIAGNOSIS — I509 Heart failure, unspecified: Secondary | ICD-10-CM | POA: Diagnosis not present

## 2019-07-14 NOTE — Telephone Encounter (Signed)
Cardiac rehab-Pt at [facility] in [place]. Spoke to [place] . He will be there for about 3 more weeks. They cannot bring him to oncology appts because pt is there for cardiac reasons. I told Delphine that we will get him rescheduled after he is discharged from [facility].

## 2019-07-15 NOTE — Telephone Encounter (Signed)
I will see him after discharge from the rehab with repeat CT scan of the chest.  Thank you.

## 2019-07-17 ENCOUNTER — Inpatient Hospital Stay: Payer: Medicare HMO

## 2019-07-19 ENCOUNTER — Telehealth: Payer: Self-pay | Admitting: Radiation Oncology

## 2019-07-19 ENCOUNTER — Inpatient Hospital Stay: Payer: Medicare HMO | Admitting: Internal Medicine

## 2019-07-19 NOTE — Telephone Encounter (Signed)
[name] [name],  Of course I'm happy to see him.  We're so used to seeing cns lesions but there are of course many other causes for altered mentation  Thedore Mins

## 2019-07-19 NOTE — Telephone Encounter (Addendum)
  Radiation Oncology         [phone]) [phone] ________________________________  Name: [name] [name] MRN: [id number]  Date of Service: 07/19/2019  DOB: [birth year]  Post Treatment Telephone Note  Diagnosis:    At least Stage IIIB, cT2aN3M0 NSCLC, squamous cell carcinoma of the left upper lobe.  Interval Since Last Radiation: 3 weeks   05/08/2019-06/29/2019:  The left lower lobe target was treated to 60 Gy in 30 fractions with a 6 Gy boost in 3 fractions.   Narrative:  The patient was contacted today for routine follow-up. During treatment he did very well with radiotherapy and did not have significant desquamation, but did have esophagitis. He was also recently hospitalized in [place], though I'm not sure what for. He had been having trouble with shortness of breath in early January, was sent for CXR that did not show acute findings, and a CT of his head was performed due to confusion and did not show disease, rather old infarcts, atrophy and evidence of ischemic disease. He has been recovering in [place] at [facility] and his sister [name] [name] states he is being discharged from there tomorrow.  She reports that her brother has been doing much better the past few weeks since finishing radiotherapy and is able to eat most anything without pain when eating. She reports he has not been confused since early January. They are getting back in to follow up with Dr. Julien Nordmann once he's discharged from [facility].  Impression/Plan: 1.  Stage IIIB, cT2aN3M0 NSCLC, squamous cell carcinoma of the left upper lobe. The patient has been doing well since completion of radiotherapy. Since he is in [facility] rehab, I spoke with his sister [name] [name]. We discussed that we would be happy to continue to follow her as needed, but she will also continue to follow up with Dr. Julien Nordmann in medical oncology.  2. 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 The patient's sister states his confusion has improved,  but I'll also check in with Dr. Mickeal Skinner to see if this issue is something he should be involved with also. If so we will refer him to be seen.     Carola Rhine, PAC

## 2019-07-20 ENCOUNTER — Other Ambulatory Visit: Payer: Self-pay | Admitting: Radiation Oncology

## 2019-07-20 ENCOUNTER — Telehealth: Payer: Self-pay | Admitting: Internal Medicine

## 2019-07-20 ENCOUNTER — Telehealth: Payer: Self-pay | Admitting: *Deleted

## 2019-07-20 DIAGNOSIS — C3492 Malignant neoplasm of unspecified part of left bronchus or lung: Secondary | ICD-10-CM

## 2019-07-20 NOTE — Telephone Encounter (Signed)
CALLED PATIENT TO INFORM OF [place] VISIT WITH DR. VASLOW ON 07-25-19 - ARRIVAL TIME- 9:45 AM , SPOKE WITH PATIENT AND HE IS AWARE OF THIS APPT.

## 2019-07-20 NOTE — Telephone Encounter (Signed)
Received a new pt referral from Dr. Isidore Moos to sch Mr. Moure to see Dr Mickeal Skinner. Pt scheduled on 2/2 at [facility] will notify the pt of the appt date and time

## 2019-07-20 NOTE — Progress Notes (Addendum)
Referral placed to Dr. Mickeal Skinner after reviewing the patient's case. The patient's sister is aware.

## 2019-07-20 NOTE — Telephone Encounter (Signed)
I talk with delphine regarding schedule

## 2019-07-21 DIAGNOSIS — N1831 Chronic kidney disease, stage 3a: Secondary | ICD-10-CM | POA: Diagnosis not present

## 2019-07-21 DIAGNOSIS — J449 Chronic obstructive pulmonary disease, unspecified: Secondary | ICD-10-CM | POA: Diagnosis not present

## 2019-07-21 DIAGNOSIS — E1151 Type 2 diabetes mellitus with diabetic peripheral angiopathy without gangrene: Secondary | ICD-10-CM | POA: Diagnosis not present

## 2019-07-21 DIAGNOSIS — I2510 Atherosclerotic heart disease of native coronary artery without angina pectoris: Secondary | ICD-10-CM | POA: Diagnosis not present

## 2019-07-21 DIAGNOSIS — I5020 Unspecified systolic (congestive) heart failure: Secondary | ICD-10-CM | POA: Diagnosis not present

## 2019-07-21 DIAGNOSIS — M109 Gout, unspecified: Secondary | ICD-10-CM | POA: Diagnosis not present

## 2019-07-21 DIAGNOSIS — I130 Hypertensive heart and chronic kidney disease with heart failure and stage 1 through stage 4 chronic kidney disease, or unspecified chronic kidney disease: Secondary | ICD-10-CM | POA: Diagnosis not present

## 2019-07-21 DIAGNOSIS — Z20828 Contact with and (suspected) exposure to other viral communicable diseases: Secondary | ICD-10-CM | POA: Diagnosis not present

## 2019-07-21 DIAGNOSIS — M159 Polyosteoarthritis, unspecified: Secondary | ICD-10-CM | POA: Diagnosis not present

## 2019-07-21 DIAGNOSIS — I509 Heart failure, unspecified: Secondary | ICD-10-CM | POA: Diagnosis not present

## 2019-07-21 DIAGNOSIS — E7800 Pure hypercholesterolemia, unspecified: Secondary | ICD-10-CM | POA: Diagnosis not present

## 2019-07-21 DIAGNOSIS — E1140 Type 2 diabetes mellitus with diabetic neuropathy, unspecified: Secondary | ICD-10-CM | POA: Diagnosis not present

## 2019-07-24 ENCOUNTER — Telehealth: Payer: Self-pay

## 2019-07-24 DIAGNOSIS — M159 Polyosteoarthritis, unspecified: Secondary | ICD-10-CM | POA: Diagnosis not present

## 2019-07-24 DIAGNOSIS — I5020 Unspecified systolic (congestive) heart failure: Secondary | ICD-10-CM | POA: Diagnosis not present

## 2019-07-24 DIAGNOSIS — E1151 Type 2 diabetes mellitus with diabetic peripheral angiopathy without gangrene: Secondary | ICD-10-CM | POA: Diagnosis not present

## 2019-07-24 DIAGNOSIS — E1140 Type 2 diabetes mellitus with diabetic neuropathy, unspecified: Secondary | ICD-10-CM | POA: Diagnosis not present

## 2019-07-24 DIAGNOSIS — N1831 Chronic kidney disease, stage 3a: Secondary | ICD-10-CM | POA: Diagnosis not present

## 2019-07-24 DIAGNOSIS — M109 Gout, unspecified: Secondary | ICD-10-CM | POA: Diagnosis not present

## 2019-07-24 DIAGNOSIS — I130 Hypertensive heart and chronic kidney disease with heart failure and stage 1 through stage 4 chronic kidney disease, or unspecified chronic kidney disease: Secondary | ICD-10-CM | POA: Diagnosis not present

## 2019-07-24 DIAGNOSIS — I2510 Atherosclerotic heart disease of native coronary artery without angina pectoris: Secondary | ICD-10-CM | POA: Diagnosis not present

## 2019-07-24 DIAGNOSIS — J449 Chronic obstructive pulmonary disease, unspecified: Secondary | ICD-10-CM | POA: Diagnosis not present

## 2019-07-24 NOTE — Telephone Encounter (Signed)
ICD alert received for VT episode on 1/9 and 1/11.  1/9 was treated with ATP and 30j shock.  1/11 treated with ATP.  Multiple NS SVT episodes in between the 2 episodes.    Spoke with pt, he has been at [facility] since 1/9 followed by rehab returning home this past week.  As of right now he feels fine.    Advised pt that scheduler will contact him to schedule upcoming appt with APP.

## 2019-07-25 ENCOUNTER — Other Ambulatory Visit: Payer: Self-pay

## 2019-07-25 ENCOUNTER — Inpatient Hospital Stay: Payer: Medicare HMO | Attending: Internal Medicine | Admitting: Internal Medicine

## 2019-07-25 DIAGNOSIS — Z7982 Long term (current) use of aspirin: Secondary | ICD-10-CM | POA: Insufficient documentation

## 2019-07-25 DIAGNOSIS — N1831 Chronic kidney disease, stage 3a: Secondary | ICD-10-CM | POA: Diagnosis not present

## 2019-07-25 DIAGNOSIS — R531 Weakness: Secondary | ICD-10-CM | POA: Insufficient documentation

## 2019-07-25 DIAGNOSIS — E1151 Type 2 diabetes mellitus with diabetic peripheral angiopathy without gangrene: Secondary | ICD-10-CM | POA: Insufficient documentation

## 2019-07-25 DIAGNOSIS — F17210 Nicotine dependence, cigarettes, uncomplicated: Secondary | ICD-10-CM | POA: Insufficient documentation

## 2019-07-25 DIAGNOSIS — J449 Chronic obstructive pulmonary disease, unspecified: Secondary | ICD-10-CM | POA: Insufficient documentation

## 2019-07-25 DIAGNOSIS — I255 Ischemic cardiomyopathy: Secondary | ICD-10-CM | POA: Diagnosis not present

## 2019-07-25 DIAGNOSIS — K219 Gastro-esophageal reflux disease without esophagitis: Secondary | ICD-10-CM | POA: Diagnosis not present

## 2019-07-25 DIAGNOSIS — R4182 Altered mental status, unspecified: Secondary | ICD-10-CM | POA: Diagnosis not present

## 2019-07-25 DIAGNOSIS — R05 Cough: Secondary | ICD-10-CM | POA: Insufficient documentation

## 2019-07-25 DIAGNOSIS — I5082 Biventricular heart failure: Secondary | ICD-10-CM | POA: Insufficient documentation

## 2019-07-25 DIAGNOSIS — E1122 Type 2 diabetes mellitus with diabetic chronic kidney disease: Secondary | ICD-10-CM | POA: Insufficient documentation

## 2019-07-25 DIAGNOSIS — I2510 Atherosclerotic heart disease of native coronary artery without angina pectoris: Secondary | ICD-10-CM | POA: Diagnosis not present

## 2019-07-25 DIAGNOSIS — E1136 Type 2 diabetes mellitus with diabetic cataract: Secondary | ICD-10-CM | POA: Insufficient documentation

## 2019-07-25 DIAGNOSIS — Z9221 Personal history of antineoplastic chemotherapy: Secondary | ICD-10-CM | POA: Insufficient documentation

## 2019-07-25 DIAGNOSIS — I5022 Chronic systolic (congestive) heart failure: Secondary | ICD-10-CM | POA: Insufficient documentation

## 2019-07-25 DIAGNOSIS — E785 Hyperlipidemia, unspecified: Secondary | ICD-10-CM | POA: Insufficient documentation

## 2019-07-25 DIAGNOSIS — C3412 Malignant neoplasm of upper lobe, left bronchus or lung: Secondary | ICD-10-CM | POA: Diagnosis not present

## 2019-07-25 DIAGNOSIS — Z950 Presence of cardiac pacemaker: Secondary | ICD-10-CM | POA: Insufficient documentation

## 2019-07-25 DIAGNOSIS — Z7901 Long term (current) use of anticoagulants: Secondary | ICD-10-CM | POA: Diagnosis not present

## 2019-07-25 DIAGNOSIS — Z7951 Long term (current) use of inhaled steroids: Secondary | ICD-10-CM | POA: Insufficient documentation

## 2019-07-25 DIAGNOSIS — M109 Gout, unspecified: Secondary | ICD-10-CM | POA: Insufficient documentation

## 2019-07-25 DIAGNOSIS — I130 Hypertensive heart and chronic kidney disease with heart failure and stage 1 through stage 4 chronic kidney disease, or unspecified chronic kidney disease: Secondary | ICD-10-CM | POA: Insufficient documentation

## 2019-07-25 DIAGNOSIS — R5383 Other fatigue: Secondary | ICD-10-CM | POA: Diagnosis not present

## 2019-07-25 DIAGNOSIS — M159 Polyosteoarthritis, unspecified: Secondary | ICD-10-CM | POA: Diagnosis not present

## 2019-07-25 DIAGNOSIS — I5020 Unspecified systolic (congestive) heart failure: Secondary | ICD-10-CM | POA: Diagnosis not present

## 2019-07-25 DIAGNOSIS — G63 Polyneuropathy in diseases classified elsewhere: Secondary | ICD-10-CM

## 2019-07-25 DIAGNOSIS — R410 Disorientation, unspecified: Secondary | ICD-10-CM | POA: Diagnosis not present

## 2019-07-25 DIAGNOSIS — Z79899 Other long term (current) drug therapy: Secondary | ICD-10-CM | POA: Insufficient documentation

## 2019-07-25 DIAGNOSIS — G629 Polyneuropathy, unspecified: Secondary | ICD-10-CM | POA: Insufficient documentation

## 2019-07-25 DIAGNOSIS — Z791 Long term (current) use of non-steroidal anti-inflammatories (NSAID): Secondary | ICD-10-CM | POA: Insufficient documentation

## 2019-07-25 DIAGNOSIS — E1140 Type 2 diabetes mellitus with diabetic neuropathy, unspecified: Secondary | ICD-10-CM | POA: Diagnosis not present

## 2019-07-25 NOTE — Progress Notes (Signed)
[facility] [place], [place] [address] [phone]   New Patient Evaluation  Date of Service: 07/25/19 Patient Name: [name] [name] Patient MRN: [id number] Patient DOB: Mar 27, 1951 Provider: Ventura Sellers, MD  Identifying Statement:  [name] [name] is a 69 y.o. male with recent confusion who presents for initial consultation and evaluation regarding cancer associated neurologic deficits.    Referring Provider: [name], PA-C [place],  [place] [address]  Primary Cancer:  Oncologic History: Oncology History  Stage III squamous cell carcinoma of left lung ([place])  04/27/2019 Initial Diagnosis   Stage III squamous cell carcinoma of left lung ([place])   05/08/2019 -  Chemotherapy   The patient had palonosetron (ALOXI) injection 0.25 mg, 0.25 mg, Intravenous,  Once, 7 of 8 cycles Administration: 0.25 mg (05/08/2019), 0.25 mg (06/05/2019), 0.25 mg (06/12/2019), 0.25 mg (05/16/2019), 0.25 mg (06/19/2019), 0.25 mg (05/22/2019), 0.25 mg (05/29/2019) CARBOplatin (PARAPLATIN) 160 mg in sodium chloride 0.9 % 250 mL chemo infusion, 160 mg (100 % of original dose 162.6 mg), Intravenous,  Once, 7 of 8 cycles Dose modification: 162.6 mg (original dose 162.6 mg, Cycle 1), 181.8 mg (original dose 162.6 mg, Cycle 6, Reason: Provider Judgment) Administration: 160 mg (05/08/2019), 160 mg (06/05/2019), 160 mg (06/12/2019), 160 mg (05/16/2019), 160 mg (06/19/2019), 160 mg (05/22/2019), 160 mg (05/29/2019) PACLitaxel (TAXOL) 96 mg in sodium chloride 0.9 % 250 mL chemo infusion (</= 80mg /m2), 45 mg/m2 = 96 mg, Intravenous,  Once, 7 of 8 cycles Administration: 96 mg (05/08/2019), 96 mg (06/05/2019), 96 mg (06/12/2019), 96 mg (05/16/2019), 96 mg (06/19/2019), 96 mg (05/22/2019), 96 mg (05/29/2019)  for chemotherapy treatment.      History of Present Illness: The patient's records from the referring physician were obtained and reviewed and the  patient interviewed to confirm this HPI.  [name] [name] presents today to discuss recent clinical changes.  Although he describes no change in mentation himself, he does acknowledge that his sister (not present today) did feel he became more confused and disorganized for a period earlier this month.  This culminated with an admission at [facility] several weeks ago, although those records are not available to [place] here.  He describes living independently, but relying on family for help with paying bills and transportation.  He is able to cook for himself.  He does describe an increase in fatigue and lethargy in recent months.  Ambulation is with a walker because of imbalance and "trouble feeling feet under me".  He completed week carbo+taxol and lung RT ~1 month ago.  Medications: Current Outpatient Medications on File Prior to Visit  Medication Sig Dispense Refill  . albuterol (VENTOLIN HFA) 108 (90 Base) MCG/ACT inhaler Inhale 1-2 puffs into the lungs every 6 (six) hours as needed for wheezing or shortness of breath. 6.7 g 6  . allopurinol (ZYLOPRIM) 300 MG tablet Take 300 mg by mouth daily.    Marland Kitchen aspirin 81 MG chewable tablet Chew 1 tablet (81 mg total) by mouth 2 (two) times daily. 60 tablet 1  . atorvastatin (LIPITOR) 20 MG tablet Take 20 mg by mouth daily.    Marland Kitchen azelastine (OPTIVAR) 0.05 % ophthalmic solution Place 1 drop into the left eye 2 (two) times daily.   0  . b complex vitamins tablet Take 1 tablet by mouth daily.    . budesonide-formoterol (SYMBICORT) 160-4.5 MCG/ACT inhaler Inhale 2 puffs into the lungs 2 (two) times daily. 1 Inhaler 6  .  carvedilol (COREG) 6.25 MG tablet TAKE 1 AND 1/2 TABLETS BY MOUTH TWICE A DAY WITH A MEAL 60 tablet 6  . celecoxib (CELEBREX) 200 MG capsule Take 200 mg by mouth 2 (two) times daily as needed for mild pain.    Marland Kitchen colchicine (COLCRYS) 0.6 MG tablet Take 0.6 mg by mouth daily as needed (gout). At on set .012 mg gout and 0.6 mg an hour later. No more that  three tablets a day    . docusate sodium (COLACE) 100 MG capsule Take 1 capsule (100 mg total) by mouth 2 (two) times daily. 60 capsule 1  . ENTRESTO 49-51 MG Take 1 tablet by mouth 2 (two) times daily.    . furosemide (LASIX) 40 MG tablet Take 40 mg by mouth 2 (two) times daily.     . isosorbide mononitrate (IMDUR) 30 MG 24 hr tablet TAKE 1 TABLET BY MOUTH EVERY DAY 30 tablet 3  . omeprazole (PRILOSEC) 40 MG capsule Take 40 mg by mouth daily.    . ondansetron (ZOFRAN) 4 MG tablet Take 1 tablet (4 mg total) by mouth every 6 (six) hours as needed for nausea. 20 tablet 0  . Potassium Chloride ER 20 MEQ TBCR Take 20 mEq by mouth daily.     Marland Kitchen senna (SENOKOT) 8.6 MG TABS tablet Take 1 tablet (8.6 mg total) by mouth 2 (two) times daily. 120 each 0  . spironolactone (ALDACTONE) 25 MG tablet Take 0.5 tablets (12.5 mg total) by mouth daily. 45 tablet 1  . sucralfate (CARAFATE) 1 g tablet Take 1 tablet (1 g total) by mouth 4 (four) times daily -  with meals and at bedtime. 5 min before meals for radiation induced esophagitis 120 tablet 2  . tiotropium (SPIRIVA) 18 MCG inhalation capsule Place 18 mcg into inhaler and inhale daily.    Marland Kitchen tiZANidine (ZANAFLEX) 4 MG tablet Take 4 mg by mouth every 6 (six) hours as needed for muscle spasms.     Marland Kitchen amiodarone (PACERONE) 400 MG tablet     . ELIQUIS 5 MG TABS tablet     . hydrALAZINE (APRESOLINE) 25 MG tablet TAKE 0.5 TABLETS (12.5 MG TOTAL) BY MOUTH 3 (THREE) TIMES DAILY. 45 tablet 3  . HYDROcodone-acetaminophen (NORCO/VICODIN) 5-325 MG tablet Take 1 tablet by mouth every 4 (four) hours as needed. (Patient not taking: Reported on 07/25/2019) 10 tablet 0  . levofloxacin (LEVAQUIN) 500 MG tablet levofloxacin 500 mg tablet  TAKE 1 TABLET BY MOUTH EVERY DAY    . metoprolol succinate (TOPROL-XL) 50 MG 24 hr tablet     . metroNIDAZOLE (FLAGYL) 500 MG tablet metronidazole 500 mg tablet  TAKE 1 TABLET BY MOUTH THREE TIMES A DAY    . oxyCODONE-acetaminophen  (PERCOCET/ROXICET) 5-325 MG tablet Take 1 tablet by mouth every 8 (eight) hours as needed for severe pain. (Patient not taking: Reported on 07/25/2019) 20 tablet 0  . prochlorperazine (COMPAZINE) 10 MG tablet Take 1 tablet (10 mg total) by mouth every 6 (six) hours as needed for nausea or vomiting. (Patient not taking: Reported on 07/25/2019) 30 tablet 0  . promethazine (PHENERGAN) 25 MG tablet promethazine 25 mg tablet  TAKE 1 TABLET BY MOUTH EVERY 8 HOURS AS NEEDED FOR NAUSEA/VOMITING     No current facility-administered medications on file prior to visit.    Allergies:  Allergies  Allergen Reactions  . Penicillins Nausea And Vomiting, Swelling and Other (See Comments)    Per Dr Audria Nine Via phone 12/27/12 0400 SWELLING REACTION  UNSPECIFIED    Past Medical History:  Past Medical History:  Diagnosis Date  . AICD (automatic cardioverter/defibrillator) present    reports no shocks  - st Jude  . Ambulates with cane   . CAD (coronary artery disease)    a. 04/2013 Cath: LM 10, LAD 40p, 48m, D1 50p, LCX 60p, 64m/100m, OM1 50, OM2 100 L-L collats, RCA 100p;  b. CABG x 5: LIMA->LAD, VG->D2, VG->RI->OM2, VG->PDA.  . Cataracts, bilateral   . Chronic systolic CHF (congestive heart failure) ([place])    a. 04/2013 Echo: EF 15-20%, diff HK, sev HK of inf and ant myocardium, dilated LA,  b. EF 20-25% with restrictive filling pattern, mod RV dysfx, mild MR (10/10/2013);  c.  Echo 12/15: EF 25-30%  . COPD (chronic obstructive pulmonary disease) ([place])   . Diabetes mellitus without complication ([place])    unsure what type but was dx as an adult, takes no meds, check his cbg at home 2 times a week   . Emphysema   . GERD (gastroesophageal reflux disease)   . Gout   . Hyperlipidemia   . Hypertension   . Ischemic cardiomyopathy   . lung ca   . PVD (peripheral vascular disease) ([place])    a. (10/2013) ABIs: RIGHT 0.63, Waveforms: monophasic;  LEFT 0.78, Waveforms: monophasic  . Tobacco abuse    30+  pack-year history  . Transaminitis    a. 04/2013 Abd U/S and CT unremarkable, hepatitis panel neg -->felt to be 2/2 acute R heart failure.   Past Surgical History:  Past Surgical History:  Procedure Laterality Date  . APPENDECTOMY  as a child  . CARDIAC CATHETERIZATION  04/08/2015   Procedure: Left Heart Cath and Cors/Grafts Angiography;  Surgeon: Peter M Martinique, MD;  Location: [facility];  Service: Cardiovascular;;  . COLONOSCOPY    . CORONARY ARTERY BYPASS GRAFT N/A 05/05/2013   Procedure: CORONARY ARTERY BYPASS GRAFTING (CABG) TIMES FIVE USING LEFT INTERNAL MAMMARY ARTERY AND RIGHT AND LEFT SAPHENOUS LEG VEIN HARVESTED ENDOSCOPICALLY;  Surgeon: Melrose Nakayama, MD;  Location: [facility];  Service: Open Heart Surgery;  Laterality: N/A;  . IMPLANTABLE CARDIOVERTER DEFIBRILLATOR IMPLANT N/A 07/06/2014   Procedure: St Jude IMPLANTABLE CARDIOVERTER DEFIBRILLATOR IMPLANT;  Surgeon: Deboraha Sprang, MD;  Location[facility] CATH LAB;  Service: Cardiovascular;  Laterality: N/A;  . LEFT AND RIGHT HEART CATHETERIZATION WITH CORONARY ANGIOGRAM N/A 04/28/2013   Procedure: LEFT AND RIGHT HEART CATHETERIZATION WITH CORONARY ANGIOGRAM;  Surgeon: Burnell Blanks, MD;  Location: [facility] CATH LAB;  Service: Cardiovascular;  Laterality: N/A;  . MULTIPLE EXTRACTIONS WITH ALVEOLOPLASTY N/A 05/03/2013   Procedure: Extraction of tooth #s 3,4,5,6,8,9,27 with alveoloplast and maxillary left osseous tuberosity reduction;  Surgeon: Lenn Cal, DDS;  Location: [facility];  Service: Oral Surgery;  Laterality: N/A;  . TOTAL HIP ARTHROPLASTY Right 11/30/2018   Procedure: TOTAL HIP ARTHROPLASTY ANTERIOR APPROACH;  Surgeon: Rod Can, MD;  Location: [facility];  Service: Orthopedics;  Laterality: Right;  Marland Kitchen VIDEO BRONCHOSCOPY WITH ENDOBRONCHIAL ULTRASOUND N/A 04/20/2019   Procedure: VIDEO BRONCHOSCOPY WITH ENDOBRONCHIAL ULTRASOUND;  Surgeon: Margaretha Seeds, MD;  Location: [facility];  Service: Thoracic;  Laterality: N/A;    Social History:  Social History   Socioeconomic History  . Marital status: Single    Spouse name: Not on file  . Number of children: Not on file  . Years of education: Not on file  . Highest education level: Not on file  Occupational History  . Occupation: Retired  Tobacco Use  . Smoking status: Current Every Day Smoker    Packs/day: 2.00    Years: 30.00    Pack years: 60.00    Types: Cigarettes    Start date: 47  . Smokeless tobacco: Never Used  . Tobacco comment: Pt states now smokes - half pack per day 04/05/19  Substance and Sexual Activity  . Alcohol use: Not Currently  . Drug use: Yes    Types: Marijuana    Comment: last use 04/17/19  . Sexual activity: Not on file  Other Topics Concern  . Not on file  Social History Narrative   Lives in [place] with his girlfriend and her daughter.     Social Determinants of Health   Financial Resource Strain:   . Difficulty of Paying Living Expenses: Not on file  Food Insecurity:   . Worried About Charity fundraiser in the Last Year: Not on file  . Ran Out of Food in the Last Year: Not on file  Transportation Needs:   . Lack of Transportation (Medical): Not on file  . Lack of Transportation (Non-Medical): Not on file  Physical Activity:   . Days of Exercise per Week: Not on file  . Minutes of Exercise per Session: Not on file  Stress:   . Feeling of Stress : Not on file  Social Connections:   . Frequency of Communication with Friends and Family: Not on file  . Frequency of Social Gatherings with Friends and Family: Not on file  . Attends Religious Services: Not on file  . Active Member of Clubs or Organizations: Not on file  . Attends Archivist Meetings: Not on file  . Marital Status: Not on file  Intimate Partner Violence:   . Fear of Current or Ex-Partner: Not on file  . Emotionally Abused: Not on file  . Physically Abused: Not on file  . Sexually Abused: Not on file   Family History:  Family  History  Problem Relation Age of Onset  . Diabetes Mother   . Hypertension Father   . Hypertension Other   . Heart attack Other        Brothers  . Diabetes Brother   . Heart attack Brother   . Heart attack Brother   . Stroke Neg Hx     Review of Systems: Constitutional: Doesn't report fevers, chills or abnormal weight loss Eyes: Doesn't report blurriness of vision Ears, nose, mouth, throat, and face: Doesn't report sore throat Respiratory: Doesn't report cough, dyspnea or wheezes Cardiovascular: Doesn't report palpitation, chest discomfort  Gastrointestinal:  Doesn't report nausea, constipation, diarrhea GU: Doesn't report incontinence Skin: Doesn't report skin rashes Neurological: Per HPI Musculoskeletal: Doesn't report joint pain Behavioral/Psych: Doesn't report anxiety  Physical Exam: Vitals:   07/25/19 0957  BP: (!) 105/59  Pulse: 80  Resp: 20  Temp: 98.3 F (36.8 C)  SpO2: 100%   KPS: 70. General: Alert, cooperative, pleasant, in no acute distress Head: Normal EENT: No conjunctival injection or scleral icterus.  Lungs: Resp effort normal Cardiac: Regular rate Abdomen: Non-distended abdomen Skin: No rashes cyanosis or petechiae. Extremities: No clubbing or edema  Neurologic Exam: Mental Status: Awake, alert, attentive to examiner. Oriented to self and environment. Language is fluent with intact comprehension. Age advanced psychomotor slowing.  Cranial Nerves: Visual acuity is grossly normal. Visual fields are full. Extra-ocular movements intact. No ptosis. Face is symmetric Motor: Tone and bulk are normal. Power is full in both arms and legs. Reflexes are  symmetric, no pathologic reflexes present.  Sensory: Stocking loss of pain/temp to above ankles Gait: Sensory dystaxia   Labs: I have reviewed the data as listed    Component Value Date/Time   NA 139 06/27/2019 0808   NA 141 08/31/2017 1433   K 3.1 (L) 06/27/2019 0808   CL 108 06/27/2019 0808   CO2  18 (L) 06/27/2019 0808   GLUCOSE 105 (H) 06/27/2019 0808   BUN 21 06/27/2019 0808   BUN 33 (H) 08/31/2017 1433   CREATININE 1.63 (H) 06/27/2019 0808   CREATININE 1.35 (H) 12/25/2015 1310   CALCIUM 7.4 (L) 06/27/2019 0808   PROT 6.1 (L) 06/27/2019 0808   ALBUMIN 3.3 (L) 06/27/2019 0808   AST 21 06/27/2019 0808   ALT 19 06/27/2019 0808   ALKPHOS 42 06/27/2019 0808   BILITOT 1.0 06/27/2019 0808   GFRNONAA 43 (L) 06/27/2019 0808   GFRAA 50 (L) 06/27/2019 0808   Lab Results  Component Value Date   WBC 1.3 (L) 06/27/2019   NEUTROABS 0.9 (L) 06/27/2019   HGB 9.1 (L) 06/27/2019   HCT 27.9 (L) 06/27/2019   MCV 86.9 06/27/2019   PLT 92 (L) 06/27/2019    Imaging:  DG Chest 2 View  Result Date: 06/28/2019 CLINICAL DATA:  Cough and shortness of breath. Reported history of lung carcinoma EXAM: CHEST - 2 VIEW COMPARISON:  Chest radiograph and chest CT June 23, 2019 FINDINGS: A pacemaker obscures area of cavitation in the left upper lobe seen on recent chest CT. There is pleural thickening in this area which is apparent on current radiograph. The cavitation seen on recent CT is subtle on the lateral view. Lungs elsewhere clear. Cardiac silhouette normal. Pulmonary vascular is normal. Pacemaker lead is attached to the right ventricle. Patient is status post coronary artery bypass grafting. No adenopathy evident. No bone lesions. IMPRESSION: Area of left upper lobe cavitation partially obscured by pacemaker device on the left. This cavitation is seen on the lateral view, although rather subtle and much better seen on recent CT. Localized pleural thickening in the periphery of the left upper lobe at the site of cavitation is noted and appears stable. Lungs elsewhere clear. Cardiac silhouette normal. No adenopathy. Postoperative changes noted. Electronically Signed   By: Lowella Grip III M.D.   On: 06/28/2019 13:37   CT Head Wo Contrast  Result Date: 06/28/2019 CLINICAL DATA:  Malignant neoplasm  of unspecified part of unspecified bronchus or lung. Lung cancer with confusion, has pacemaker and renal insufficiency. EXAM: CT HEAD WITHOUT CONTRAST TECHNIQUE: Contiguous axial images were obtained from the base of the skull through the vertex without intravenous contrast. COMPARISON:  Head CT 12/19/2012 FINDINGS: Brain: No evidence of acute intracranial hemorrhage. No demarcated cortical infarction. No midline shift or extra-axial fluid collection. Within the limitations of a noncontrast head CT, no intracranial metastatic disease is identified. Ill-defined hypoattenuation within the cerebral white matter is nonspecific, but consistent with chronic small vessel ischemic disease. Redemonstrated chronic lacunar infarcts within the right caudate and lateral right thalamus. Mild generalized parenchymal atrophy. Vascular: No hyperdense vessel.  Atherosclerotic calcifications. Skull: Normal. Negative for fracture or focal lesion. Sinuses/Orbits: Visualized orbits demonstrate no acute abnormality. IMPRESSION: Within limitations of a noncontrast head CT, no intracranial metastatic disease is identified. Redemonstrated remote lacunar infarcts within the right caudate head and right lateral thalamus. Background of mild generalized parenchymal atrophy and chronic small vessel ischemic disease. Electronically Signed   By: Kellie Simmering DO   On: 06/28/2019 14:04  CUP PACEART REMOTE DEVICE CHECK  Result Date: 06/29/2019 Scheduled remote reviewed.  Normal device function.  There were five short NSVT arrhythmias detected Next remote 91 days. 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 Stan presents with clinical syndrome consisting of age-advanced pyschomotor slowing and length dependent peripheral neuropathy.  Etiology for these changes is multifactorial. 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  Recent course of chemotherapy likely acted as exacerbating agent.  Per his sister, he has been somewhat improved recently, which would fit with his treatment history.  Peripheral neuropathy is length dependent, symmetric and affecting small and large fibers.  Likely etiology is diabetes and platinum based chemotherapy exposure.  This is affecting the independence of his gait.  We recommended no further workup due to explainable causes described above.  He would benefit greatly from in-home nursing and physical and occupational therapy assessments.  We will make those arrangements and follow up with him in 3 months for in-person visit.  We strongly recommended having a family member present for next visit to corroborate aspects of history.  We spent twenty additional minutes teaching regarding the natural history, biology, and historical experience in the treatment of neurologic complications of cancer.   We appreciate the opportunity to participate in the care of Dulce Sellar.   All questions were answered. The patient knows to call the clinic with any problems, questions or concerns. No barriers to learning were detected.  The total time spent in the encounter was 40 minutes and more than 50% was on counseling and review of test results   Ventura Sellers, MD Medical Director of Neuro-Oncology [facility] 07/25/19 4:10 PM

## 2019-07-26 ENCOUNTER — Encounter: Payer: Self-pay | Admitting: Student

## 2019-07-26 ENCOUNTER — Telehealth: Payer: Self-pay | Admitting: Internal Medicine

## 2019-07-26 ENCOUNTER — Ambulatory Visit (INDEPENDENT_AMBULATORY_CARE_PROVIDER_SITE_OTHER): Payer: Medicare HMO | Admitting: Student

## 2019-07-26 VITALS — BP 112/70 | HR 76 | Ht 67.0 in | Wt 198.4 lb

## 2019-07-26 DIAGNOSIS — I4891 Unspecified atrial fibrillation: Secondary | ICD-10-CM

## 2019-07-26 DIAGNOSIS — Z79899 Other long term (current) drug therapy: Secondary | ICD-10-CM | POA: Diagnosis not present

## 2019-07-26 DIAGNOSIS — I5022 Chronic systolic (congestive) heart failure: Secondary | ICD-10-CM | POA: Diagnosis not present

## 2019-07-26 DIAGNOSIS — I10 Essential (primary) hypertension: Secondary | ICD-10-CM | POA: Diagnosis not present

## 2019-07-26 DIAGNOSIS — Z95810 Presence of automatic (implantable) cardiac defibrillator: Secondary | ICD-10-CM

## 2019-07-26 LAB — COMPREHENSIVE METABOLIC PANEL
ALT: 176 IU/L — ABNORMAL HIGH (ref 0–44)
AST: 56 IU/L — ABNORMAL HIGH (ref 0–40)
Albumin/Globulin Ratio: 1.3 (ref 1.2–2.2)
Albumin: 3.2 g/dL — ABNORMAL LOW (ref 3.8–4.8)
Alkaline Phosphatase: 82 IU/L (ref 39–117)
BUN/Creatinine Ratio: 14 (ref 10–24)
BUN: 26 mg/dL (ref 8–27)
Bilirubin Total: 1 mg/dL (ref 0.0–1.2)
CO2: 19 mmol/L — ABNORMAL LOW (ref 20–29)
Calcium: 8.7 mg/dL (ref 8.6–10.2)
Chloride: 104 mmol/L (ref 96–106)
Creatinine, Ser: 1.92 mg/dL — ABNORMAL HIGH (ref 0.76–1.27)
GFR calc Af Amer: 41 mL/min/{1.73_m2} — ABNORMAL LOW (ref >59)
GFR calc non Af Amer: 35 mL/min/{1.73_m2} — ABNORMAL LOW (ref >59)
Globulin, Total: 2.5 g/dL (ref 1.5–4.5)
Glucose: 97 mg/dL (ref 65–99)
Potassium: 3.9 mmol/L (ref 3.5–5.2)
Sodium: 141 mmol/L (ref 134–144)
Total Protein: 5.7 g/dL — ABNORMAL LOW (ref 6.0–8.5)

## 2019-07-26 LAB — TSH: TSH: 0.761 u[IU]/mL (ref 0.450–4.500)

## 2019-07-26 LAB — MAGNESIUM: Magnesium: 1.2 mg/dL — ABNORMAL LOW (ref 1.6–2.3)

## 2019-07-26 NOTE — Telephone Encounter (Signed)
Scheduled appt per 2/2 los.  Sent a message to HIM pool to get a calendar mailed out.

## 2019-07-26 NOTE — Patient Instructions (Addendum)
Medication Instructions:  none *If you need a refill on your cardiac medications before your next appointment, please call your pharmacy*  Lab Work:TODAY CMET TSH MAGNESIUM If you have labs (blood work) drawn today and your tests are completely normal, you will receive your results only by: Marland Kitchen MyChart Message (if you have MyChart) OR . A paper copy in the mail If you have any lab test that is abnormal or we need to change your treatment, we will call you to review the results.  Testing/Procedures: none  Follow-Up: 4 WEEKS WITH Oda Kilts, [facility], you and your health needs are our priority.  As part of our continuing mission to provide you with exceptional heart care, we have created designated Provider Care Teams.  These Care Teams include your primary Cardiologist (physician) and Advanced Practice Providers (APPs -  Physician Assistants and Nurse Practitioners) who all work together to provide you with the care you need, when you need it.   Other Instructions Remote monitoring is used to monitor your ICD from home. This monitoring reduces the number of office visits required to check your device to one time per year. It allows [place] to keep an eye on the functioning of your device to ensure it is working properly. You are scheduled for a device check from home on 09/26/19. You may send your transmission at any time that day. If you have a wireless device, the transmission will be sent automatically. After your physician reviews your transmission, you will receive a postcard with your next transmission date.  TAKE ALL OF YOUR MEDICATIONS IN A BAG TO Dr Aundra Dubin on [facility]

## 2019-07-26 NOTE — Progress Notes (Signed)
Electrophysiology Office Note Date: 07/26/2019  ID:  [name] [name], DOB [birth year], MRN [id number]  PCP: [name], PA-C Primary Cardiologist: Loralie Champagne, MD Electrophysiologist: Dr. Caryl Comes   CC: Routine ICD follow-up  [name] [name] is a 69 y.o. male with history of COPD, HTN, 3V CAD s/p CABG x 5  (CABG 04/2013: LIMA to LAD, SVG to second diagonal, SVG to ramus intermediate and obtuse marginal 2, SVG to posterior descending), ischemic cardiomyopathy, chronic systolic HF, COPD, DM2, PVD and tobacco abuse. St Jude ICD.  seen today for Dr. Caryl Comes.   Lexiscan Cardiolite in 12/19 showed inferior infarct, no ischemia.  Echo in 12/19 showed EF 35%, mild LVH, mildly decreased RV systolic function.  ABIs in 12/19 were stable compared to the past.    He was admitted to [facility] 1/9 for fatigue and feeling poorly.  Records not available. Received ICD shock on 1/9 after multiple "failed" ATP and x1 on 1/11 after failed ATP. Reviewed with Dr. Caryl Comes and all appear to be inappropriate due to SVT with sudden onset.   He is feeling better today. He had been in rehab until last Wednesday. He is newly on amiodarone and Eliquis. He remembers being told he would be on a blood thinner, but does not remember atrial fibrillation or flutter being mentioned. Today he denies chest pain, palpitations, dyspnea, PND, orthopnea, nausea, vomiting, dizziness, syncope, edema, weight gain, or early satiety.  He has had ICD shocks.   Device History: St. Jude Single Chamber ICD implanted 06/2014 for primary prevention History of appropriate therapy: No History of AAD therapy: Yes Now on amio   Past Medical History:  Diagnosis Date  . AICD (automatic cardioverter/defibrillator) present    reports no shocks  - st Jude  . Ambulates with cane   . CAD (coronary artery disease)    a. 04/2013 Cath: LM 10, LAD 40p, 12m D1 50p, LCX 60p, 967m25m, OM1 50, OM2 100 L-L collats, RCA 100p;  b. CABG x 5: LIMA->LAD, VG->D2,  VG->RI->OM2, VG->PDA.  . Cataracts, bilateral   . Chronic systolic CHF (congestive heart failure) ([facility]   a. 04/2013 Echo: EF 15-20%, diff HK, sev HK of inf and ant myocardium, dilated LA,  b. EF 20-25% with restrictive filling pattern, mod RV dysfx, mild MR (10/10/2013);  c.  Echo 12/15: EF 25-30%  . COPD (chronic obstructive pulmonary disease) ([facility]  . Diabetes mellitus without complication ([facility]   unsure what type but was dx as an adult, takes no meds, check his cbg at home 2 times a week   . Emphysema   . GERD (gastroesophageal reflux disease)   . Gout   . Hyperlipidemia   . Hypertension   . Ischemic cardiomyopathy   . lung ca   . PVD (peripheral vascular disease) ([facility]   a. (10/2013) ABIs: RIGHT 0.63, Waveforms: monophasic;  LEFT 0.78, Waveforms: monophasic  . Tobacco abuse    30+ pack-year history  . Transaminitis    a. 04/2013 Abd U/S and CT unremarkable, hepatitis panel neg -->felt to be 2/2 acute R heart failure.   Past Surgical History:  Procedure Laterality Date  . APPENDECTOMY  as a child  . CARDIAC CATHETERIZATION  04/08/2015   Procedure: Left Heart Cath and Cors/Grafts Angiography;  Surgeon: Peter M JoMartiniqueMD;  [facility];  Service: Cardiovascular;;  . COLONOSCOPY    . CORONARY ARTERY BYPASS GRAFT N/A 05/05/2013   Procedure: CORONARY ARTERY BYPASS GRAFTING (CABG) TIMES FIVE USING  LEFT INTERNAL MAMMARY ARTERY AND RIGHT AND LEFT SAPHENOUS LEG VEIN HARVESTED ENDOSCOPICALLY;  Surgeon: Melrose Nakayama, MD;  Location: [facility];  Service: Open Heart Surgery;  Laterality: N/A;  . IMPLANTABLE CARDIOVERTER DEFIBRILLATOR IMPLANT N/A 07/06/2014   Procedure: St Jude IMPLANTABLE CARDIOVERTER DEFIBRILLATOR IMPLANT;  Surgeon: Deboraha Sprang, MD;  Location: [facility] CATH LAB;  Service: Cardiovascular;  Laterality: N/A;  . LEFT AND RIGHT HEART CATHETERIZATION WITH CORONARY ANGIOGRAM N/A 04/28/2013   Procedure: LEFT AND RIGHT HEART CATHETERIZATION WITH CORONARY ANGIOGRAM;   Surgeon: Burnell Blanks, MD;  Location: [facility] CATH LAB;  Service: Cardiovascular;  Laterality: N/A;  . MULTIPLE EXTRACTIONS WITH ALVEOLOPLASTY N/A 05/03/2013   Procedure: Extraction of tooth #s 3,4,5,6,8,9,27 with alveoloplast and maxillary left osseous tuberosity reduction;  Surgeon: Lenn Cal, DDS;  Location: [facility];  Service: Oral Surgery;  Laterality: N/A;  . TOTAL HIP ARTHROPLASTY Right 11/30/2018   Procedure: TOTAL HIP ARTHROPLASTY ANTERIOR APPROACH;  Surgeon: Rod Can, MD;  Location: [facility];  Service: Orthopedics;  Laterality: Right;  Marland Kitchen VIDEO BRONCHOSCOPY WITH ENDOBRONCHIAL ULTRASOUND N/A 04/20/2019   Procedure: VIDEO BRONCHOSCOPY WITH ENDOBRONCHIAL ULTRASOUND;  Surgeon: Margaretha Seeds, MD;  Location: [facility];  Service: Thoracic;  Laterality: N/A;    Current Outpatient Medications  Medication Sig Dispense Refill  . albuterol (VENTOLIN HFA) 108 (90 Base) MCG/ACT inhaler Inhale 1-2 puffs into the lungs every 6 (six) hours as needed for wheezing or shortness of breath. 6.7 g 6  . allopurinol (ZYLOPRIM) 300 MG tablet Take 300 mg by mouth daily.    Marland Kitchen aspirin 81 MG chewable tablet Chew 1 tablet (81 mg total) by mouth 2 (two) times daily. 60 tablet 1  . atorvastatin (LIPITOR) 20 MG tablet Take 20 mg by mouth daily.    Marland Kitchen azelastine (OPTIVAR) 0.05 % ophthalmic solution Place 1 drop into the left eye 2 (two) times daily.   0  . b complex vitamins tablet Take 1 tablet by mouth daily.    . budesonide-formoterol (SYMBICORT) 160-4.5 MCG/ACT inhaler Inhale 2 puffs into the lungs 2 (two) times daily. 1 Inhaler 6  . carvedilol (COREG) 6.25 MG tablet TAKE 1 AND 1/2 TABLETS BY MOUTH TWICE A DAY WITH A MEAL 60 tablet 6  . celecoxib (CELEBREX) 200 MG capsule Take 200 mg by mouth 2 (two) times daily as needed for mild pain.    Marland Kitchen colchicine (COLCRYS) 0.6 MG tablet Take 0.6 mg by mouth daily as needed (gout). At on set .012 mg gout and 0.6 mg an hour later. No more that three tablets a day      . docusate sodium (COLACE) 100 MG capsule Take 1 capsule (100 mg total) by mouth 2 (two) times daily. 60 capsule 1  . ENTRESTO 49-51 MG Take 1 tablet by mouth 2 (two) times daily.    . furosemide (LASIX) 40 MG tablet Take 40 mg by mouth 2 (two) times daily.     Marland Kitchen HYDROcodone-acetaminophen (NORCO/VICODIN) 5-325 MG tablet Take 1 tablet by mouth every 4 (four) hours as needed. 10 tablet 0  . isosorbide mononitrate (IMDUR) 30 MG 24 hr tablet TAKE 1 TABLET BY MOUTH EVERY DAY 30 tablet 3  . levofloxacin (LEVAQUIN) 500 MG tablet levofloxacin 500 mg tablet  TAKE 1 TABLET BY MOUTH EVERY DAY    . metroNIDAZOLE (FLAGYL) 500 MG tablet metronidazole 500 mg tablet  TAKE 1 TABLET BY MOUTH THREE TIMES A DAY    . omeprazole (PRILOSEC) 40 MG capsule Take 40 mg by mouth  daily.    . ondansetron (ZOFRAN) 4 MG tablet Take 1 tablet (4 mg total) by mouth every 6 (six) hours as needed for nausea. 20 tablet 0  . oxyCODONE-acetaminophen (PERCOCET/ROXICET) 5-325 MG tablet Take 1 tablet by mouth every 8 (eight) hours as needed for severe pain. 20 tablet 0  . Potassium Chloride ER 20 MEQ TBCR Take 20 mEq by mouth daily.     . prochlorperazine (COMPAZINE) 10 MG tablet Take 1 tablet (10 mg total) by mouth every 6 (six) hours as needed for nausea or vomiting. 30 tablet 0  . promethazine (PHENERGAN) 25 MG tablet promethazine 25 mg tablet  TAKE 1 TABLET BY MOUTH EVERY 8 HOURS AS NEEDED FOR NAUSEA/VOMITING    . senna (SENOKOT) 8.6 MG TABS tablet Take 1 tablet (8.6 mg total) by mouth 2 (two) times daily. 120 each 0  . spironolactone (ALDACTONE) 25 MG tablet Take 0.5 tablets (12.5 mg total) by mouth daily. 45 tablet 1  . sucralfate (CARAFATE) 1 g tablet Take 1 tablet (1 g total) by mouth 4 (four) times daily -  with meals and at bedtime. 5 min before meals for radiation induced esophagitis 120 tablet 2  . tiotropium (SPIRIVA) 18 MCG inhalation capsule Place 18 mcg into inhaler and inhale daily.    Marland Kitchen tiZANidine (ZANAFLEX) 4 MG  tablet Take 4 mg by mouth every 6 (six) hours as needed for muscle spasms.     Marland Kitchen amiodarone (PACERONE) 400 MG tablet     . ELIQUIS 5 MG TABS tablet     . hydrALAZINE (APRESOLINE) 25 MG tablet TAKE 0.5 TABLETS (12.5 MG TOTAL) BY MOUTH 3 (THREE) TIMES DAILY. 45 tablet 3  . metoprolol succinate (TOPROL-XL) 50 MG 24 hr tablet      No current facility-administered medications for this visit.    Allergies:   Penicillins   Social History: Social History   Socioeconomic History  . Marital status: Single    Spouse name: Not on file  . Number of children: Not on file  . Years of education: Not on file  . Highest education level: Not on file  Occupational History  . Occupation: Retired  Tobacco Use  . Smoking status: Current Every Day Smoker    Packs/day: 2.00    Years: 30.00    Pack years: 60.00    Types: Cigarettes    Start date: 35  . Smokeless tobacco: Never Used  . Tobacco comment: Pt states now smokes - half pack per day 04/05/19  Substance and Sexual Activity  . Alcohol use: Not Currently  . Drug use: Yes    Types: Marijuana    Comment: last use 04/17/19  . Sexual activity: Not on file  Other Topics Concern  . Not on file  Social History Narrative   Lives in [place] with his girlfriend and her daughter.     Social Determinants of Health   Financial Resource Strain:   . Difficulty of Paying Living Expenses: Not on file  Food Insecurity:   . Worried About Charity fundraiser in the Last Year: Not on file  . Ran Out of Food in the Last Year: Not on file  Transportation Needs:   . Lack of Transportation (Medical): Not on file  . Lack of Transportation (Non-Medical): Not on file  Physical Activity:   . Days of Exercise per Week: Not on file  . Minutes of Exercise per Session: Not on file  Stress:   . Feeling of Stress : Not  on file  Social Connections:   . Frequency of Communication with Friends and Family: Not on file  . Frequency of Social Gatherings with Friends  and Family: Not on file  . Attends Religious Services: Not on file  . Active Member of Clubs or Organizations: Not on file  . Attends Archivist Meetings: Not on file  . Marital Status: Not on file  Intimate Partner Violence:   . Fear of Current or Ex-Partner: Not on file  . Emotionally Abused: Not on file  . Physically Abused: Not on file  . Sexually Abused: Not on file    Family History: Family History  Problem Relation Age of Onset  . Diabetes Mother   . Hypertension Father   . Hypertension Other   . Heart attack Other        Brothers  . Diabetes Brother   . Heart attack Brother   . Heart attack Brother   . Stroke Neg Hx     Review of Systems: All other systems reviewed and are otherwise negative except as noted above.   Physical Exam: Vitals:   07/26/19 0949  BP: 112/70  Pulse: 76  SpO2: 98%  Weight: 198 lb 6.4 oz (90 kg)  Height: _0  (1.702 m)     GEN- The patient is well appearing, alert and oriented x 3 today.   HEENT: normocephalic, atraumatic; sclera clear, conjunctiva pink; hearing intact; oropharynx clear; neck supple, no JVP Lymph- no cervical lymphadenopathy Lungs- Clear to ausculation bilaterally, normal work of breathing.  No wheezes, rales, rhonchi Heart- Regular rate and rhythm, no murmurs, rubs or gallops, PMI not laterally displaced GI- soft, non-tender, non-distended, bowel sounds present, no hepatosplenomegaly Extremities- no clubbing, cyanosis, or edema; DP/PT/radial pulses 2+ bilaterally MS- no significant deformity or atrophy Skin- warm and dry, no rash or lesion; ICD pocket well healed Psych- euthymic mood, full affect Neuro- strength and sensation are intact  ICD interrogation- reviewed in detail today,  See PACEART report  EKG:  EKG is ordered today. The ekg ordered today shows NSR at 73 bpm with PVCs.   Recent Labs: 06/27/2019: ALT 19; BUN 21; Creatinine 1.63; Hemoglobin 9.1; Platelet Count 92; Potassium 3.1; Sodium 139    Wt Readings from Last 3 Encounters:  07/26/19 198 lb 6.4 oz (90 kg)  07/25/19 198 lb 1.6 oz (89.9 kg)  06/27/19 193 lb 12.8 oz (87.9 kg)     Other studies Reviewed: Additional studies/ records that were reviewed today include: Previous EP and CHF notes, previous remotes.    Assessment and Plan:  1.  Chronic systolic dysfunction s/p St. Jude single chamber ICD  euvolemic today Stable on an appropriate medical regimen Normal ICD function See Pace Art report Discrimators changes to ALL must match VT for therapy to take place. Previously was set to if ANY (of Wavelet, Onset, or Stability) determined VT, he would get shock. Now must meet all three criteria  2. Presumed AF with RVR with inappropriate shock.  Continue amiodarone 400 mg at undetermined dose at this time.  Will ask Dr. Aundra Dubin to decrease when he sees him on Friday, once we figure out which dose he is currently taking.  Continue eliquis for CHA2DS2VASC of at least 5   Surveillance labs today on amio He should stop ASA on Eliquis. He is not sure if he is taking.   3. CKD IV Labs today.   4. HTN No change today.   Current medicines are reviewed at length with  the patient today.   The patient has concerns regarding his medicines. He is not sure what dose he is on.  No changes today with uncertainty of doses. I have asked him to take all of his pill bottles to visit with Dr. Aundra Dubin.   Labs/ tests ordered today include:  Orders Placed This Encounter  Procedures  . Comp Met (CMET)  . TSH  . Magnesium  . EKG 12-Lead   Disposition:   Follow up with Dr. Aundra Dubin Friday. Follow up with me in 4 weeks for further.   Jacalyn Lefevre, PA-C  07/26/2019 12:01 PM  [facility] [place] [address] [phone] (office) [phone] (fax)

## 2019-07-27 ENCOUNTER — Telehealth: Payer: Self-pay

## 2019-07-27 DIAGNOSIS — I130 Hypertensive heart and chronic kidney disease with heart failure and stage 1 through stage 4 chronic kidney disease, or unspecified chronic kidney disease: Secondary | ICD-10-CM | POA: Diagnosis not present

## 2019-07-27 DIAGNOSIS — J449 Chronic obstructive pulmonary disease, unspecified: Secondary | ICD-10-CM | POA: Diagnosis not present

## 2019-07-27 DIAGNOSIS — E1140 Type 2 diabetes mellitus with diabetic neuropathy, unspecified: Secondary | ICD-10-CM | POA: Diagnosis not present

## 2019-07-27 DIAGNOSIS — M109 Gout, unspecified: Secondary | ICD-10-CM | POA: Diagnosis not present

## 2019-07-27 DIAGNOSIS — I2510 Atherosclerotic heart disease of native coronary artery without angina pectoris: Secondary | ICD-10-CM | POA: Diagnosis not present

## 2019-07-27 DIAGNOSIS — I5020 Unspecified systolic (congestive) heart failure: Secondary | ICD-10-CM | POA: Diagnosis not present

## 2019-07-27 DIAGNOSIS — E1151 Type 2 diabetes mellitus with diabetic peripheral angiopathy without gangrene: Secondary | ICD-10-CM | POA: Diagnosis not present

## 2019-07-27 DIAGNOSIS — N1831 Chronic kidney disease, stage 3a: Secondary | ICD-10-CM | POA: Diagnosis not present

## 2019-07-27 DIAGNOSIS — M159 Polyosteoarthritis, unspecified: Secondary | ICD-10-CM | POA: Diagnosis not present

## 2019-07-27 LAB — CUP PACEART INCLINIC DEVICE CHECK
Battery Remaining Longevity: 56 mo
Brady Statistic RV Percent Paced: 0 %
Date Time Interrogation Session: 20210203095400
HighPow Impedance: 61.875
Implantable Lead Implant Date: 20160114190000
Implantable Lead Location: 753860
Implantable Lead Model: 7122
Implantable Pulse Generator Implant Date: 20160114190000
Lead Channel Impedance Value: 337.5 Ohm
Lead Channel Pacing Threshold Amplitude: 1.25 V
Lead Channel Pacing Threshold Amplitude: 1.25 V
Lead Channel Pacing Threshold Pulse Width: 0.5 ms
Lead Channel Pacing Threshold Pulse Width: 0.5 ms
Lead Channel Sensing Intrinsic Amplitude: 4.9 mV
Lead Channel Setting Pacing Amplitude: 2.5 V
Lead Channel Setting Pacing Pulse Width: 0.5 ms
Lead Channel Setting Sensing Sensitivity: 0.5 mV
Pulse Gen Serial Number: 7233603

## 2019-07-27 MED ORDER — MAGNESIUM OXIDE 400 MG PO CAPS: 400.0000 mg | ORAL_CAPSULE | Freq: Two times a day (BID) | ORAL | 3 refills | Status: DC

## 2019-07-27 NOTE — Telephone Encounter (Signed)
-----   Message from Shirley Friar, PA-C sent at 07/27/2019  8:13 AM EST ----- Magnesium significantly low and with  AKI.   Please start on Mag ox 400 mg BID.    Would have him also HOLD his lasix TODAY ONLY.    Has CHF visit tomorrow. Please stress upon him the importance of going to that visit.

## 2019-07-27 NOTE — Telephone Encounter (Signed)
The patient has been notified of the lab result and verbalized understanding.  All questions (if any) were answered. Frederik Schmidt, RN 07/27/2019 9:19 AM   He will start Magnesium Oxide 400 mg Twice per day.

## 2019-07-28 ENCOUNTER — Other Ambulatory Visit: Payer: Self-pay

## 2019-07-28 ENCOUNTER — Encounter (HOSPITAL_COMMUNITY): Payer: Self-pay | Admitting: Cardiology

## 2019-07-28 ENCOUNTER — Ambulatory Visit (HOSPITAL_COMMUNITY)
Admission: RE | Admit: 2019-07-28 | Discharge: 2019-07-28 | Disposition: A | Discharge status: Home / Self Care | Payer: Medicare HMO | Source: Ambulatory Visit | Attending: Cardiology | Admitting: Cardiology

## 2019-07-28 VITALS — BP 102/52 | HR 77 | Wt 198.0 lb

## 2019-07-28 DIAGNOSIS — Z7901 Long term (current) use of anticoagulants: Secondary | ICD-10-CM | POA: Diagnosis not present

## 2019-07-28 DIAGNOSIS — C3490 Malignant neoplasm of unspecified part of unspecified bronchus or lung: Secondary | ICD-10-CM | POA: Diagnosis not present

## 2019-07-28 DIAGNOSIS — Z923 Personal history of irradiation: Secondary | ICD-10-CM | POA: Insufficient documentation

## 2019-07-28 DIAGNOSIS — N183 Chronic kidney disease, stage 3 unspecified: Secondary | ICD-10-CM | POA: Diagnosis not present

## 2019-07-28 DIAGNOSIS — Z95810 Presence of automatic (implantable) cardiac defibrillator: Secondary | ICD-10-CM | POA: Diagnosis not present

## 2019-07-28 DIAGNOSIS — E785 Hyperlipidemia, unspecified: Secondary | ICD-10-CM | POA: Diagnosis not present

## 2019-07-28 DIAGNOSIS — E1122 Type 2 diabetes mellitus with diabetic chronic kidney disease: Secondary | ICD-10-CM | POA: Diagnosis not present

## 2019-07-28 DIAGNOSIS — Z951 Presence of aortocoronary bypass graft: Secondary | ICD-10-CM | POA: Diagnosis not present

## 2019-07-28 DIAGNOSIS — I130 Hypertensive heart and chronic kidney disease with heart failure and stage 1 through stage 4 chronic kidney disease, or unspecified chronic kidney disease: Secondary | ICD-10-CM | POA: Insufficient documentation

## 2019-07-28 DIAGNOSIS — R7989 Other specified abnormal findings of blood chemistry: Secondary | ICD-10-CM | POA: Diagnosis not present

## 2019-07-28 DIAGNOSIS — M109 Gout, unspecified: Secondary | ICD-10-CM | POA: Insufficient documentation

## 2019-07-28 DIAGNOSIS — Z8249 Family history of ischemic heart disease and other diseases of the circulatory system: Secondary | ICD-10-CM | POA: Diagnosis not present

## 2019-07-28 DIAGNOSIS — M1990 Unspecified osteoarthritis, unspecified site: Secondary | ICD-10-CM | POA: Insufficient documentation

## 2019-07-28 DIAGNOSIS — I255 Ischemic cardiomyopathy: Secondary | ICD-10-CM | POA: Diagnosis not present

## 2019-07-28 DIAGNOSIS — I480 Paroxysmal atrial fibrillation: Secondary | ICD-10-CM | POA: Insufficient documentation

## 2019-07-28 DIAGNOSIS — I4891 Unspecified atrial fibrillation: Secondary | ICD-10-CM | POA: Diagnosis not present

## 2019-07-28 DIAGNOSIS — J449 Chronic obstructive pulmonary disease, unspecified: Secondary | ICD-10-CM | POA: Insufficient documentation

## 2019-07-28 DIAGNOSIS — I2510 Atherosclerotic heart disease of native coronary artery without angina pectoris: Secondary | ICD-10-CM | POA: Diagnosis not present

## 2019-07-28 DIAGNOSIS — Z87891 Personal history of nicotine dependence: Secondary | ICD-10-CM | POA: Diagnosis not present

## 2019-07-28 DIAGNOSIS — I4510 Unspecified right bundle-branch block: Secondary | ICD-10-CM | POA: Diagnosis not present

## 2019-07-28 DIAGNOSIS — I5022 Chronic systolic (congestive) heart failure: Secondary | ICD-10-CM | POA: Insufficient documentation

## 2019-07-28 DIAGNOSIS — Z79899 Other long term (current) drug therapy: Secondary | ICD-10-CM | POA: Diagnosis not present

## 2019-07-28 MED ORDER — AMIODARONE HCL 400 MG PO TABS: 200.0000 mg | ORAL_TABLET | Freq: Every day | ORAL | 5 refills | Status: DC

## 2019-07-28 MED ORDER — SPIRONOLACTONE 25 MG PO TABS: 25.0000 mg | ORAL_TABLET | Freq: Every day | ORAL | 1 refills | Status: DC

## 2019-07-28 NOTE — Patient Instructions (Signed)
STOP Aspirin   DECREASE Amiodarone to 200mg  (1 tab) daily   Take Over The Counter Melatonin for sleep.  Please read label for dosing instructions   Labs in 1 week We will only contact you if something comes back abnormal or we need to make some changes. Otherwise no news is good news!   Your physician has requested that you have an echocardiogram. Echocardiography is a painless test that uses sound waves to create images of your heart. It provides your doctor with information about the size and shape of your heart and how well your heart's chambers and valves are working. This procedure takes approximately one hour. There are no restrictions for this procedure.   Your physician recommends that you schedule a follow-up appointment in: 2 months with Dr Aundra Dubin   Please call office at [phone] option [phone] if you have any questions or concerns.    At the [facility], you and your health needs are our priority. As part of our continuing mission to provide you with exceptional heart care, we have created designated Provider Care Teams. These Care Teams include your primary Cardiologist (physician) and Advanced Practice Providers (APPs- Physician Assistants and Nurse Practitioners) who all work together to provide you with the care you need, when you need it.   You may see any of the following providers on your designated Care Team at your next follow up: Marland Kitchen Dr Glori Bickers . Dr Loralie Champagne . Darrick Grinder, NP . Lyda Jester, PA . Audry Riles, PharmD   Please be sure to bring in all your medications bottles to every appointment.

## 2019-07-30 NOTE — Progress Notes (Signed)
Patient ID: [name] [name], male   DOB: 1950/11/22, 69 y.o.   MRN: [id number] PCP: [name] [facility]) Cardiac Surgeon: Dr Roxan Hockey Pulmonologist: Dr. Lamonte Sakai Cardiology: Dr. Aundra Dubin  HPI: [name] [name] is a 69 y.o. with history of COPD, HTN, 3V CAD s/p CABG x 5  (CABG 04/2013: LIMA to LAD, SVG to second diagonal, SVG to ramus intermediate and obtuse marginal 2, SVG to posterior descending), ischemic cardiomyopathy, chronic systolic HF, COPD, DM2, PVD and tobacco abuse. St Jude ICD.   Lexiscan Cardiolite in 12/19 showed inferior infarct, no ischemia.  Echo in 12/19 showed EF 35%, mild LVH, mildly decreased RV systolic function.  ABIs in 12/19 were stable compared to the past.    In the fall of 2020, he was diagnosed with non-small cell lung cancer.  He has been treated with radiation and carboplatin/palclitaxel.  He has lost over 20 lbs.  He was admitted 1/21 at [facility] with ICD shocks.  These were found to be inappropriate shocks probably due to atrial fibrillation.  He is now on amiodarone and Eliquis.    He returns for followup of CHF and CAD. He has quit smoking. He is generally very weak.  Uses a walker.  Short of breath after walking 100 feet.  No chest pain.  No dizziness. No orthopnea/PND.    ECG (personally reviewed): NSR, RBBB, LAFB  Labs (11/19): K 4.6, creatinine 2.05 Labs(12/19): K 4.3, creatinine 2.07, LDL 57  Labs (9/20): K 4.2, creatinine 2.09 Labs (2/21): TSH normal, K 3.9, creatinine 1.92, AST 56, ALT 176    PMH: 1. Chronic systolic CHF: Ischemic cardiomyopathy.  St Jude ICD.  - ECHO 06/26/13 EF 20-25% RV mild HK - ECHO 10/10/13 EF 20-25% restrictive filling pattern. Moderate RV dysfunction. Mild MR. - Cardiolite (10/16) with EF 19%, anterior ischemia.  - Echo 7/17 EF 30-35% - Echo (12/19): EF 35%, mild LVH, basal-mid inferior akinesis, mildly decreased RV systolic function, mild MR.  2. CAD: CABG x 5 11/14 with LIMA-LAD, SVG-D2, seq SVG-ramus/OM2, SVG-PDA.  - LHC  (10/16) with patent LIMA-LAD and 60% pLAD stenosis (competitive flow). SVG-D, sequential SVG-ramus/OM2, SVG-PDA all patent. Medical management.  - Lexiscan Cardiolite in 12/19 showed inferior infarction, no ischemia.  3. COPD: Has not quit smoking.   4. PAD:  - ABIs 11/07/13: RABI .63 moderate arterial occlusive dz,  LABI .78 moderate arterial occlusive dz - ABIs 9/16: RABI .68, LABI 0.75 - ABIs 12/19: right ABI 0.75, left ABI 0.7 5. Type 2 diabetes 6. Hyperlipidemia 7. Osteoarthritis: Left THR in 6/20.  8. Gout  9. Hyperlipidemia 10. CKD: Stage 3.  11. Non-small cell lung cancer: Diagnosed 10/20, treated with carboplatin/paclitaxel + radiation.   12. Atrial fibrillation: Paroxysmal.   SH: Lives with his girlfriend.  Quit smoking in 2020. Does not drink alcohol.   FH: 2 brothers died from heart attacks, Mother DM, Dad HTN   ROS: All systems negative except as listed in HPI, PMH and Problem List.  Current Outpatient Medications  Medication Sig Dispense Refill  . allopurinol (ZYLOPRIM) 300 MG tablet Take 300 mg by mouth daily.    Marland Kitchen amiodarone (PACERONE) 400 MG tablet Take 0.5 tablets (200 mg total) by mouth daily. 30 tablet 5  . atorvastatin (LIPITOR) 20 MG tablet Take 20 mg by mouth daily.    Marland Kitchen b complex vitamins tablet Take 1 tablet by mouth daily.    Marland Kitchen ELIQUIS 5 MG TABS tablet     . furosemide (LASIX) 40 MG tablet Take 40  mg by mouth 2 (two) times daily.     . metoprolol succinate (TOPROL-XL) 50 MG 24 hr tablet     . omeprazole (PRILOSEC) 40 MG capsule Take 40 mg by mouth daily.    . Potassium Chloride ER 20 MEQ TBCR Take 20 mEq by mouth daily.     Marland Kitchen senna (SENOKOT) 8.6 MG TABS tablet Take 1 tablet (8.6 mg total) by mouth 2 (two) times daily. 120 each 0  . spironolactone (ALDACTONE) 25 MG tablet Take 1 tablet (25 mg total) by mouth daily. 90 tablet 1  . sucralfate (CARAFATE) 1 g tablet Take 1 tablet (1 g total) by mouth 4 (four) times daily -  with meals and at bedtime. 5 min  before meals for radiation induced esophagitis 120 tablet 2  . albuterol (VENTOLIN HFA) 108 (90 Base) MCG/ACT inhaler Inhale 1-2 puffs into the lungs every 6 (six) hours as needed for wheezing or shortness of breath. (Patient not taking: Reported on 07/28/2019) 6.7 g 6  . azelastine (OPTIVAR) 0.05 % ophthalmic solution Place 1 drop into the left eye 2 (two) times daily.   0  . hydrALAZINE (APRESOLINE) 25 MG tablet TAKE 0.5 TABLETS (12.5 MG TOTAL) BY MOUTH 3 (THREE) TIMES DAILY. 45 tablet 3   No current facility-administered medications for this encounter.    Vitals:   07/28/19 1029  BP: (!) 102/52  Pulse: 77  SpO2: 99%  Weight: 89.8 kg (198 lb)    Wt Readings from Last 3 Encounters:  07/28/19 89.8 kg (198 lb)  07/26/19 90 kg (198 lb 6.4 oz)  07/25/19 89.9 kg (198 lb 1.6 oz)    PHYSICAL EXAM: General: NAD Neck: No JVD, no thyromegaly or thyroid nodule.  Lungs: Bilateral rhonchi CV: Nondisplaced PMI.  Heart regular S1/S2, no S3/S4, no murmur.  No peripheral edema.  No carotid bruit.  Normal pedal pulses.  Abdomen: Soft, nontender, no hepatosplenomegaly, no distention.  Skin: Intact without lesions or rashes.  Neurologic: Alert and oriented x 3.  Psych: Normal affect. Extremities: No clubbing or cyanosis.  HEENT: Normal.   ASSESSMENT & PLAN:  1. CAD s/p CABG: Had cath in 10/16 with patent grafts, medical management.  Cardiolite in 12/19 showed no ischemia (just prior infarction).  - Continue statin.  - Stop aspirin given stable CAD and Eliquis use.   2. Chronic systolic HF: Ischemic cardiomyopathy, St Jude ICD. Echo 12/19 with EF 35%.  NYHA class III symptoms, he is not volume overloaded on exam.  Think he is primarily limited by deconditioning and COPD.  - He is on Toprol XL 50 mg daily now instead of Coreg.  - He is off Entresto with low BP and elevated creatinine.  - Increase spironolactone to 25 mg daily with BMET 1 week.  - Continue Lasix 40 mg bid.   - I will arrange for  repeat echo.  3. COPD/smoking: He has now quit smoking.  4. PAD: Stable claudication and stable ABIs in 12/19. He tries to walk through the pain.   - Continue statin.   5. CKD: Stage 3.   - repeat BMET 1 week.   6. Atrial fibrillation: Paroxysmal.  He is in NSR today. Had inappropriate ICD shocks in setting of atrial fibrillation with RVR.  - Continue Eliquis.  - Decrease amiodarone to 200 mg daily.  7. Elevated LFTs: This appears new, ?related to amiodarone.  - Decreasing amiodarone today.  - Repeat LFTs with labs in 1 week.  - Abdominal US if LFTs  remain elevated.  8. Lung cancer: NSCLC.  Radiation is completed, still get chemotherapy.    Followup 2 months.   Loralie Champagne 07/30/2019

## 2019-07-31 DIAGNOSIS — I130 Hypertensive heart and chronic kidney disease with heart failure and stage 1 through stage 4 chronic kidney disease, or unspecified chronic kidney disease: Secondary | ICD-10-CM | POA: Diagnosis not present

## 2019-07-31 DIAGNOSIS — M109 Gout, unspecified: Secondary | ICD-10-CM | POA: Diagnosis not present

## 2019-07-31 DIAGNOSIS — I5020 Unspecified systolic (congestive) heart failure: Secondary | ICD-10-CM | POA: Diagnosis not present

## 2019-07-31 DIAGNOSIS — I2510 Atherosclerotic heart disease of native coronary artery without angina pectoris: Secondary | ICD-10-CM | POA: Diagnosis not present

## 2019-07-31 DIAGNOSIS — E1151 Type 2 diabetes mellitus with diabetic peripheral angiopathy without gangrene: Secondary | ICD-10-CM | POA: Diagnosis not present

## 2019-07-31 DIAGNOSIS — E1140 Type 2 diabetes mellitus with diabetic neuropathy, unspecified: Secondary | ICD-10-CM | POA: Diagnosis not present

## 2019-07-31 DIAGNOSIS — J449 Chronic obstructive pulmonary disease, unspecified: Secondary | ICD-10-CM | POA: Diagnosis not present

## 2019-07-31 DIAGNOSIS — M159 Polyosteoarthritis, unspecified: Secondary | ICD-10-CM | POA: Diagnosis not present

## 2019-07-31 DIAGNOSIS — N1831 Chronic kidney disease, stage 3a: Secondary | ICD-10-CM | POA: Diagnosis not present

## 2019-08-01 ENCOUNTER — Other Ambulatory Visit: Payer: Self-pay | Admitting: *Deleted

## 2019-08-01 ENCOUNTER — Telehealth: Payer: Self-pay | Admitting: *Deleted

## 2019-08-01 DIAGNOSIS — R4182 Altered mental status, unspecified: Secondary | ICD-10-CM

## 2019-08-01 NOTE — Telephone Encounter (Signed)
Home Health referral processed to [facility] in [facility].  Pending acceptance.

## 2019-08-02 DIAGNOSIS — C3492 Malignant neoplasm of unspecified part of left bronchus or lung: Secondary | ICD-10-CM | POA: Diagnosis not present

## 2019-08-02 DIAGNOSIS — R269 Unspecified abnormalities of gait and mobility: Secondary | ICD-10-CM | POA: Diagnosis not present

## 2019-08-02 DIAGNOSIS — J449 Chronic obstructive pulmonary disease, unspecified: Secondary | ICD-10-CM | POA: Diagnosis not present

## 2019-08-02 DIAGNOSIS — E119 Type 2 diabetes mellitus without complications: Secondary | ICD-10-CM | POA: Diagnosis not present

## 2019-08-02 DIAGNOSIS — Z79899 Other long term (current) drug therapy: Secondary | ICD-10-CM | POA: Diagnosis not present

## 2019-08-02 DIAGNOSIS — N183 Chronic kidney disease, stage 3 unspecified: Secondary | ICD-10-CM | POA: Diagnosis not present

## 2019-08-02 DIAGNOSIS — I10 Essential (primary) hypertension: Secondary | ICD-10-CM | POA: Diagnosis not present

## 2019-08-02 DIAGNOSIS — I4891 Unspecified atrial fibrillation: Secondary | ICD-10-CM | POA: Diagnosis not present

## 2019-08-02 DIAGNOSIS — I509 Heart failure, unspecified: Secondary | ICD-10-CM | POA: Diagnosis not present

## 2019-08-03 ENCOUNTER — Telehealth: Payer: Self-pay | Admitting: *Deleted

## 2019-08-03 ENCOUNTER — Emergency Department (HOSPITAL_COMMUNITY)
Admission: EM | Admit: 2019-08-03 | Discharge: 2019-08-03 | Disposition: A | Discharge status: Home / Self Care | Payer: Medicare HMO | Attending: Emergency Medicine | Admitting: Emergency Medicine

## 2019-08-03 ENCOUNTER — Other Ambulatory Visit: Payer: Self-pay

## 2019-08-03 ENCOUNTER — Encounter (HOSPITAL_COMMUNITY): Payer: Self-pay | Admitting: Emergency Medicine

## 2019-08-03 DIAGNOSIS — M109 Gout, unspecified: Secondary | ICD-10-CM | POA: Diagnosis not present

## 2019-08-03 DIAGNOSIS — I509 Heart failure, unspecified: Secondary | ICD-10-CM | POA: Diagnosis not present

## 2019-08-03 DIAGNOSIS — F17210 Nicotine dependence, cigarettes, uncomplicated: Secondary | ICD-10-CM | POA: Diagnosis not present

## 2019-08-03 DIAGNOSIS — C3490 Malignant neoplasm of unspecified part of unspecified bronchus or lung: Secondary | ICD-10-CM | POA: Insufficient documentation

## 2019-08-03 DIAGNOSIS — J449 Chronic obstructive pulmonary disease, unspecified: Secondary | ICD-10-CM | POA: Insufficient documentation

## 2019-08-03 DIAGNOSIS — I5022 Chronic systolic (congestive) heart failure: Secondary | ICD-10-CM | POA: Diagnosis not present

## 2019-08-03 DIAGNOSIS — I739 Peripheral vascular disease, unspecified: Secondary | ICD-10-CM | POA: Insufficient documentation

## 2019-08-03 DIAGNOSIS — Z7901 Long term (current) use of anticoagulants: Secondary | ICD-10-CM | POA: Diagnosis not present

## 2019-08-03 DIAGNOSIS — I130 Hypertensive heart and chronic kidney disease with heart failure and stage 1 through stage 4 chronic kidney disease, or unspecified chronic kidney disease: Secondary | ICD-10-CM | POA: Diagnosis not present

## 2019-08-03 DIAGNOSIS — I4891 Unspecified atrial fibrillation: Secondary | ICD-10-CM | POA: Insufficient documentation

## 2019-08-03 DIAGNOSIS — E876 Hypokalemia: Secondary | ICD-10-CM | POA: Diagnosis not present

## 2019-08-03 DIAGNOSIS — Z95811 Presence of heart assist device: Secondary | ICD-10-CM | POA: Insufficient documentation

## 2019-08-03 DIAGNOSIS — I2510 Atherosclerotic heart disease of native coronary artery without angina pectoris: Secondary | ICD-10-CM | POA: Diagnosis not present

## 2019-08-03 DIAGNOSIS — Z6830 Body mass index (BMI) 30.0-30.9, adult: Secondary | ICD-10-CM | POA: Diagnosis not present

## 2019-08-03 DIAGNOSIS — Z79899 Other long term (current) drug therapy: Secondary | ICD-10-CM | POA: Insufficient documentation

## 2019-08-03 DIAGNOSIS — N1831 Chronic kidney disease, stage 3a: Secondary | ICD-10-CM | POA: Diagnosis not present

## 2019-08-03 DIAGNOSIS — E1151 Type 2 diabetes mellitus with diabetic peripheral angiopathy without gangrene: Secondary | ICD-10-CM | POA: Diagnosis not present

## 2019-08-03 DIAGNOSIS — M159 Polyosteoarthritis, unspecified: Secondary | ICD-10-CM | POA: Diagnosis not present

## 2019-08-03 DIAGNOSIS — D649 Anemia, unspecified: Secondary | ICD-10-CM | POA: Diagnosis not present

## 2019-08-03 DIAGNOSIS — I5020 Unspecified systolic (congestive) heart failure: Secondary | ICD-10-CM | POA: Diagnosis not present

## 2019-08-03 DIAGNOSIS — E1140 Type 2 diabetes mellitus with diabetic neuropathy, unspecified: Secondary | ICD-10-CM | POA: Diagnosis not present

## 2019-08-03 DIAGNOSIS — I10 Essential (primary) hypertension: Secondary | ICD-10-CM | POA: Diagnosis not present

## 2019-08-03 LAB — CBC
HCT: 28.6 % — ABNORMAL LOW (ref 39.0–52.0)
Hemoglobin: 8.3 g/dL — ABNORMAL LOW (ref 13.0–17.0)
MCH: 31.3 pg (ref 26.0–34.0)
MCHC: 29 g/dL — ABNORMAL LOW (ref 30.0–36.0)
MCV: 107.9 fL — ABNORMAL HIGH (ref 80.0–100.0)
Platelets: 203 10*3/uL (ref 150–400)
RBC: 2.65 MIL/uL — ABNORMAL LOW (ref 4.22–5.81)
RDW: 20.3 % — ABNORMAL HIGH (ref 11.5–15.5)
WBC: 6.3 10*3/uL (ref 4.0–10.5)
nRBC: 0.3 % — ABNORMAL HIGH (ref 0.0–0.2)

## 2019-08-03 LAB — TYPE AND SCREEN
ABO/RH(D): O POS
Antibody Screen: NEGATIVE

## 2019-08-03 LAB — COMPREHENSIVE METABOLIC PANEL
ALT: 104 U/L — ABNORMAL HIGH (ref 0–44)
AST: 21 U/L (ref 15–41)
Albumin: 2.5 g/dL — ABNORMAL LOW (ref 3.5–5.0)
Alkaline Phosphatase: 65 U/L (ref 38–126)
Anion gap: 10 (ref 5–15)
BUN: 12 mg/dL (ref 8–23)
CO2: 21 mmol/L — ABNORMAL LOW (ref 22–32)
Calcium: 8.4 mg/dL — ABNORMAL LOW (ref 8.9–10.3)
Chloride: 107 mmol/L (ref 98–111)
Creatinine, Ser: 1.41 mg/dL — ABNORMAL HIGH (ref 0.61–1.24)
GFR calc Af Amer: 59 mL/min — ABNORMAL LOW (ref >60)
GFR calc non Af Amer: 51 mL/min — ABNORMAL LOW (ref >60)
Glucose, Bld: 150 mg/dL — ABNORMAL HIGH (ref 70–99)
Potassium: 3 mmol/L — ABNORMAL LOW (ref 3.5–5.1)
Sodium: 138 mmol/L (ref 135–145)
Total Bilirubin: 0.8 mg/dL (ref 0.3–1.2)
Total Protein: 6.1 g/dL — ABNORMAL LOW (ref 6.5–8.1)

## 2019-08-03 MED ORDER — POTASSIUM CHLORIDE CRYS ER 20 MEQ PO TBCR
40.0000 meq | EXTENDED_RELEASE_TABLET | Freq: Once | ORAL | Status: AC
  Administered 2019-08-03: 40 meq via ORAL
  Filled 2019-08-03: qty 2

## 2019-08-03 MED ORDER — POTASSIUM CHLORIDE CRYS ER 20 MEQ PO TBCR: 20.0000 meq | EXTENDED_RELEASE_TABLET | Freq: Every day | ORAL | 0 refills | Status: AC

## 2019-08-03 NOTE — ED Triage Notes (Signed)
Patient states he was told by his PCP to come due to low hemoglobin (8.4) Patient denies any dark stools or known blood loss. Only c/o weakness over the past few days. States he was recently admitted and spent a week at rehab. Patient has home walker with him.

## 2019-08-03 NOTE — ED Provider Notes (Signed)
[facility] EMERGENCY DEPARTMENT Provider Note   CSN: [id number] Arrival date & time: 08/03/19  1326     History Chief Complaint  Patient presents with  . Abnormal Lab    [name] Fenter. is a 69 y.o. male.  Patient was told by primary care doctor to come for low hemoglobin of 8.4.  Patient denies any dark or black stools or any red blood in his stools.  He has some generalized weakness over the past few days.  Patient has a history of lung CA.  Recently completed some radiation treatment.  And has some chemo treatment coming up.  Patient's blood pressure here is 128/63 oxygen sats 100%.  Not febrile not tachycardic no acute respiratory distress.  Patient uses a walker which she has with him.  Patient brought in by family member.        Past Medical History:  Diagnosis Date  . AICD (automatic cardioverter/defibrillator) present    reports no shocks  - st Jude  . Ambulates with cane   . CAD (coronary artery disease)    a. 04/2013 Cath: LM 10, LAD 40p, 35m, D1 50p, LCX 60p, 59m/100m, OM1 50, OM2 100 L-L collats, RCA 100p;  b. CABG x 5: LIMA->LAD, VG->D2, VG->RI->OM2, VG->PDA.  . Cataracts, bilateral   . Chronic systolic CHF (congestive heart failure) ([place])    a. 04/2013 Echo: EF 15-20%, diff HK, sev HK of inf and ant myocardium, dilated LA,  b. EF 20-25% with restrictive filling pattern, mod RV dysfx, mild MR (10/10/2013);  c.  Echo 12/15: EF 25-30%  . COPD (chronic obstructive pulmonary disease) ([place])   . Diabetes mellitus without complication ([place])    unsure what type but was dx as an adult, takes no meds, check his cbg at home 2 times a week   . Emphysema   . GERD (gastroesophageal reflux disease)   . Gout   . Hyperlipidemia   . Hypertension   . Ischemic cardiomyopathy   . lung ca   . PVD (peripheral vascular disease) ([place])    a. (10/2013) ABIs: RIGHT 0.63, Waveforms: monophasic;  LEFT 0.78, Waveforms: monophasic  . Tobacco abuse    30+ pack-year history    . Transaminitis    a. 04/2013 Abd U/S and CT unremarkable, hepatitis panel neg -->felt to be 2/2 acute R heart failure.    Patient Active Problem List   Diagnosis Date Noted  . Altered mental status 07/25/2019  . Peripheral neuropathy 07/25/2019  . Dehydration 06/27/2019  . Primary malignant neoplasm of bronchus of left upper lobe ([place]) 05/24/2019  . Stage III squamous cell carcinoma of left lung ([place]) 04/27/2019  . Encounter for antineoplastic chemotherapy 04/27/2019  . Goals of care, counseling/discussion 04/27/2019  . Avascular necrosis of bone of right hip ([place]) 11/30/2018  . Avascular necrosis of hip, right ([place]) 11/30/2018  . Chest pain 04/08/2015  . Abnormal nuclear stress test 04/08/2015  . Ischemic cardiomyopathy 07/06/2014  . Lumbago 03/13/2014  . Special screening for malignant neoplasms, colon 02/09/2014  . Normocytic anemia 02/09/2014  . Pleural effusion on left 11/28/2013  . Atherosclerosis of native arteries of extremity with intermittent claudication ([place]) 11/07/2013  . Weakness of both legs 11/07/2013  . Smoker 09/19/2013  . Numbness in right leg/ Foot 06/14/2013  . Pain in limb-Right Leg/foot 06/14/2013  . Atherosclerosis of native arteries of the extremities with ulceration(440.23) 06/14/2013  . Chronic systolic heart failure ([place]) 05/29/2013  . Atrial fibrillation ([place]) 05/29/2013  . Chest  pain, mid sternal 05/19/2013  . S/P CABG x 5 05/08/2013  . Right foot ulcer ([place]) 05/04/2013  . Coronary atherosclerosis of native coronary artery 04/28/2013  . Acute systolic heart failure ([place]) 04/28/2013  . Abnormal blood chemistry 12/30/2012  . Abnormal LFTs 12/30/2012  . Aspiration pneumonia ([place]) 12/30/2012  . Chronic congestive heart failure ([place]) 12/27/2012  . Leg weakness 12/26/2012  . Pneumonia 05/18/2011  . Gout 05/17/2011  . Respiratory distress, acute 05/17/2011  . Hypertension 05/17/2011  . COPD (chronic obstructive pulmonary disease) ([place]) 05/17/2011   . CHF, acute ([place]) 05/17/2011    Past Surgical History:  Procedure Laterality Date  . APPENDECTOMY  as a child  . CARDIAC CATHETERIZATION  04/08/2015   Procedure: Left Heart Cath and Cors/Grafts Angiography;  Surgeon: Peter M Martinique, MD;  Location: [facility];  Service: Cardiovascular;;  . COLONOSCOPY    . CORONARY ARTERY BYPASS GRAFT N/A 05/05/2013   Procedure: CORONARY ARTERY BYPASS GRAFTING (CABG) TIMES FIVE USING LEFT INTERNAL MAMMARY ARTERY AND RIGHT AND LEFT SAPHENOUS LEG VEIN HARVESTED ENDOSCOPICALLY;  Surgeon: Melrose Nakayama, MD;  Location: [facility];  Service: Open Heart Surgery;  Laterality: N/A;  . IMPLANTABLE CARDIOVERTER DEFIBRILLATOR IMPLANT N/A 07/06/2014   Procedure: St Jude IMPLANTABLE CARDIOVERTER DEFIBRILLATOR IMPLANT;  Surgeon: Deboraha Sprang, MD;  Location: [facility] CATH LAB;  Service: Cardiovascular;  Laterality: N/A;  . LEFT AND RIGHT HEART CATHETERIZATION WITH CORONARY ANGIOGRAM N/A 04/28/2013   Procedure: LEFT AND RIGHT HEART CATHETERIZATION WITH CORONARY ANGIOGRAM;  Surgeon: Burnell Blanks, MD;  Location: [facility] CATH LAB;  Service: Cardiovascular;  Laterality: N/A;  . MULTIPLE EXTRACTIONS WITH ALVEOLOPLASTY N/A 05/03/2013   Procedure: Extraction of tooth #s 3,4,5,6,8,9,27 with alveoloplast and maxillary left osseous tuberosity reduction;  Surgeon: Lenn Cal, DDS;  Location: [facility];  Service: Oral Surgery;  Laterality: N/A;  . TOTAL HIP ARTHROPLASTY Right 11/30/2018   Procedure: TOTAL HIP ARTHROPLASTY ANTERIOR APPROACH;  Surgeon: Rod Can, MD;  Location: [facility];  Service: Orthopedics;  Laterality: Right;  Marland Kitchen VIDEO BRONCHOSCOPY WITH ENDOBRONCHIAL ULTRASOUND N/A 04/20/2019   Procedure: VIDEO BRONCHOSCOPY WITH ENDOBRONCHIAL ULTRASOUND;  Surgeon: Margaretha Seeds, MD;  Location: [facility];  Service: Thoracic;  Laterality: N/A;       Family History  Problem Relation Age of Onset  . Diabetes Mother   . Hypertension Father   . Hypertension Other   .  Heart attack Other        Brothers  . Diabetes Brother   . Heart attack Brother   . Heart attack Brother   . Stroke Neg Hx     Social History   Tobacco Use  . Smoking status: Former Smoker    Packs/day: 2.00    Years: 30.00    Pack years: 60.00    Types: Cigarettes    Start date: 1969    Quit date: 03/23/2019    Years since quitting: 0.3  . Smokeless tobacco: Never Used  . Tobacco comment: Pt states now smokes - half pack per day 04/05/19  Substance Use Topics  . Alcohol use: Not Currently  . Drug use: Yes    Types: Marijuana    Comment: last use 04/17/19    Home Medications Prior to Admission medications   Medication Sig Start Date End Date Taking? Authorizing Provider  albuterol (VENTOLIN HFA) 108 (90 Base) MCG/ACT inhaler Inhale 1-2 puffs into the lungs every 6 (six) hours as needed for wheezing or shortness of breath. Patient not taking: Reported on 07/28/2019  04/05/19   Margaretha Seeds, MD  allopurinol (ZYLOPRIM) 300 MG tablet Take 300 mg by mouth daily.    [provider]  amiodarone (PACERONE) 400 MG tablet Take 0.5 tablets (200 mg total) by mouth daily. 07/28/19   Larey Dresser, MD  atorvastatin (LIPITOR) 20 MG tablet Take 20 mg by mouth daily.    [provider]  azelastine (OPTIVAR) 0.05 % ophthalmic solution Place 1 drop into the left eye 2 (two) times daily.  04/21/18   [provider]  b complex vitamins tablet Take 1 tablet by mouth daily.    [provider]  ELIQUIS 5 MG TABS tablet  07/20/19   [provider]  furosemide (LASIX) 40 MG tablet Take 40 mg by mouth 2 (two) times daily.     [provider]  hydrALAZINE (APRESOLINE) 25 MG tablet TAKE 0.5 TABLETS (12.5 MG TOTAL) BY MOUTH 3 (THREE) TIMES DAILY. 04/18/19 07/17/19  Darrick Grinder D, NP  metoprolol succinate (TOPROL-XL) 50 MG 24 hr tablet  07/20/19   [provider]  omeprazole (PRILOSEC) 40 MG capsule Take 40 mg by mouth daily.    [provider]  Potassium Chloride ER 20 MEQ TBCR Take 20 mEq by mouth daily.  01/25/19   [provider]  potassium chloride SA (KLOR-CON) 20 MEQ tablet Take 1 tablet (20 mEq total) by mouth daily. 08/03/19   Fredia Sorrow, MD  senna (SENOKOT) 8.6 MG TABS tablet Take 1 tablet (8.6 mg total) by mouth 2 (two) times daily. 12/01/18   Swinteck, Aaron Edelman, MD  spironolactone (ALDACTONE) 25 MG tablet Take 1 tablet (25 mg total) by mouth daily. 07/28/19   Larey Dresser, MD  sucralfate (CARAFATE) 1 g tablet Take 1 tablet (1 g total) by mouth 4 (four) times daily -  with meals and at bedtime. 5 min before meals for radiation induced esophagitis 06/28/19   Hayden Pedro, PA-C    Allergies    Penicillins  Review of Systems   Review of Systems  Constitutional: Positive for fatigue. Negative for chills and fever.  HENT: Negative for congestion, rhinorrhea and sore throat.   Eyes: Negative for visual disturbance.  Respiratory: Negative for cough and shortness of breath.   Cardiovascular: Negative for chest pain and leg swelling.  Gastrointestinal: Negative for abdominal pain, blood in stool, diarrhea, nausea and vomiting.  Genitourinary: Negative for dysuria.  Musculoskeletal: Negative for back pain and neck pain.  Skin: Negative for rash.  Neurological: Negative for dizziness, weakness, light-headedness and headaches.  Hematological: Does not bruise/bleed easily.  Psychiatric/Behavioral: Negative for confusion.    Physical Exam Updated Vital Signs BP 114/67   Pulse 75   Temp 97.9 F (36.6 C) (Oral)   Resp (!) 21   SpO2 99%   Physical Exam Vitals and nursing note reviewed.  Constitutional:      Appearance: Normal appearance. He is well-developed.  HENT:     Head: Normocephalic and atraumatic.  Eyes:     Extraocular Movements: Extraocular movements intact.     Conjunctiva/sclera: Conjunctivae normal.     Pupils: Pupils are equal, round, and reactive to light.    Cardiovascular:     Rate and Rhythm: Normal rate and regular rhythm.     Heart sounds: No murmur.  Pulmonary:     Effort: Pulmonary effort is normal. No respiratory distress.     Breath sounds: Normal breath sounds.  Abdominal:     Palpations: Abdomen is soft.  Tenderness: There is no abdominal tenderness.  Musculoskeletal:        General: No swelling.     Cervical back: Neck supple.  Skin:    General: Skin is warm and dry.     Capillary Refill: Capillary refill takes less than 2 seconds.  Neurological:     General: No focal deficit present.     Mental Status: He is alert and oriented to person, place, and time.     ED Results / Procedures / Treatments   Labs (all labs ordered are listed, but only abnormal results are displayed) Labs Reviewed  COMPREHENSIVE METABOLIC PANEL - Abnormal; Notable for the following components:      Result Value   Potassium 3.0 (*)    CO2 21 (*)    Glucose, Bld 150 (*)    Creatinine, Ser 1.41 (*)    Calcium 8.4 (*)    Total Protein 6.1 (*)    Albumin 2.5 (*)    ALT 104 (*)    GFR calc non Af Amer 51 (*)    GFR calc Af Amer 59 (*)    All other components within normal limits  CBC - Abnormal; Notable for the following components:   RBC 2.65 (*)    Hemoglobin 8.3 (*)    HCT 28.6 (*)    MCV 107.9 (*)    MCHC 29.0 (*)    RDW 20.3 (*)    nRBC 0.3 (*)    All other components within normal limits  TYPE AND SCREEN    EKG EKG Interpretation  Date/Time:  Thursday August 03 2019 16:47:20 EST Ventricular Rate:  75 PR Interval:    QRS Duration: 156 QT Interval:  487 QTC Calculation: 544 R Axis:   -71 Text Interpretation: Sinus rhythm Borderline prolonged PR interval Ventricular preexcitation(WPW) Confirmed by Fredia Sorrow [phone]) on 08/03/2019 4:54:45 PM   Radiology No results found.  Procedures Procedures (including critical care time)  Medications Ordered in ED Medications  potassium chloride SA (KLOR-CON) CR tablet 40  mEq (40 mEq Oral Given 08/03/19 1705)    ED Course  I have reviewed the triage vital signs and the nursing notes.  Pertinent labs & imaging results that were available during my care of the patient were reviewed by me and considered in my medical decision making (see chart for details).    MDM Rules/Calculators/A&P                     Lab work-up here significant for hemoglobin of 8.3.  Not significant change from the hemoglobin 8.4.  Patient's potassium was on the low side at 3.0.  This does require some treatment.  Patient given 40 mEq potassium here.  And will take 20 mEq tomorrow and the next day.  Patient does have some renal insufficiency but renal function without significant change.  Patient's white blood cell count showing some improvement at 6.3.  Hemoglobin is restated here was a 8.3.  Not meeting transfusion threshold.  Patient with vital signs are stable.  Recommend patient have blood work redone next week.  Keep his appointment for chemotherapy.  Take the potassium as directed.  Also have his potassium checked next week.  Final Clinical Impression(s) / ED Diagnoses Final diagnoses:  Hypokalemia  Anemia, unspecified type  Malignant neoplasm of lung, unspecified laterality, unspecified part of lung ([place])    Rx / DC Orders ED Discharge Orders         Ordered    potassium  chloride SA (KLOR-CON) 20 MEQ tablet  Daily     08/03/19 1804           Fredia Sorrow, MD 08/03/19 1812

## 2019-08-03 NOTE — Discharge Instructions (Signed)
Potassium is a little low here today.  Take the oral potassium as directed for the next 2 days.  Hemoglobin here today was 8.3.  Blood transfusions usually done for hemoglobin is below 7.  Patient's white blood cell count is showing good improvement following recent treatments for lung cancer.  Recommend that he have his hemoglobin rechecked by either primary care doctor or by hematology oncology next week.  Return for any new or worse symptoms any shortness of breath chest pain passing out or for blood in the bowel movements.

## 2019-08-03 NOTE — Telephone Encounter (Signed)
Patients sister called and was told by patient that Dr. Mickeal Skinner wanted to speak with her.  Routed information to Dr. Mickeal Skinner.  Phone numbers on file for sister are accurate for call back if appropriate.

## 2019-08-04 ENCOUNTER — Ambulatory Visit (HOSPITAL_COMMUNITY)
Admission: RE | Admit: 2019-08-04 | Discharge: 2019-08-04 | Disposition: A | Discharge status: Home / Self Care | Payer: Medicare HMO | Source: Ambulatory Visit | Attending: Cardiology | Admitting: Cardiology

## 2019-08-04 ENCOUNTER — Ambulatory Visit (HOSPITAL_COMMUNITY)
Admission: RE | Admit: 2019-08-04 | Discharge: 2019-08-04 | Disposition: A | Discharge status: Home / Self Care | Payer: Medicare HMO | Source: Ambulatory Visit | Attending: Internal Medicine | Admitting: Internal Medicine

## 2019-08-04 DIAGNOSIS — I5022 Chronic systolic (congestive) heart failure: Secondary | ICD-10-CM

## 2019-08-04 DIAGNOSIS — I081 Rheumatic disorders of both mitral and tricuspid valves: Secondary | ICD-10-CM | POA: Insufficient documentation

## 2019-08-04 DIAGNOSIS — Z950 Presence of cardiac pacemaker: Secondary | ICD-10-CM | POA: Diagnosis not present

## 2019-08-04 LAB — COMPREHENSIVE METABOLIC PANEL
ALT: 83 U/L — ABNORMAL HIGH (ref 0–44)
AST: 17 U/L (ref 15–41)
Albumin: 2.5 g/dL — ABNORMAL LOW (ref 3.5–5.0)
Alkaline Phosphatase: 62 U/L (ref 38–126)
Anion gap: 10 (ref 5–15)
BUN: 13 mg/dL (ref 8–23)
CO2: 22 mmol/L (ref 22–32)
Calcium: 9 mg/dL (ref 8.9–10.3)
Chloride: 109 mmol/L (ref 98–111)
Creatinine, Ser: 1.4 mg/dL — ABNORMAL HIGH (ref 0.61–1.24)
GFR calc Af Amer: 59 mL/min — ABNORMAL LOW (ref >60)
GFR calc non Af Amer: 51 mL/min — ABNORMAL LOW (ref >60)
Glucose, Bld: 110 mg/dL — ABNORMAL HIGH (ref 70–99)
Potassium: 3.7 mmol/L (ref 3.5–5.1)
Sodium: 141 mmol/L (ref 135–145)
Total Bilirubin: 1 mg/dL (ref 0.3–1.2)
Total Protein: 6.1 g/dL — ABNORMAL LOW (ref 6.5–8.1)

## 2019-08-04 NOTE — Progress Notes (Signed)
  Echocardiogram 2D Echocardiogram has been performed.  [name] [name] 08/04/2019, 11:01 AM

## 2019-08-07 ENCOUNTER — Other Ambulatory Visit: Payer: Self-pay

## 2019-08-07 ENCOUNTER — Other Ambulatory Visit: Payer: Self-pay | Admitting: Medical Oncology

## 2019-08-07 ENCOUNTER — Inpatient Hospital Stay: Payer: Medicare HMO

## 2019-08-07 DIAGNOSIS — C3492 Malignant neoplasm of unspecified part of left bronchus or lung: Secondary | ICD-10-CM

## 2019-08-07 DIAGNOSIS — I130 Hypertensive heart and chronic kidney disease with heart failure and stage 1 through stage 4 chronic kidney disease, or unspecified chronic kidney disease: Secondary | ICD-10-CM | POA: Diagnosis not present

## 2019-08-07 DIAGNOSIS — R05 Cough: Secondary | ICD-10-CM | POA: Diagnosis not present

## 2019-08-07 DIAGNOSIS — J449 Chronic obstructive pulmonary disease, unspecified: Secondary | ICD-10-CM | POA: Diagnosis not present

## 2019-08-07 DIAGNOSIS — I5022 Chronic systolic (congestive) heart failure: Secondary | ICD-10-CM | POA: Diagnosis not present

## 2019-08-07 DIAGNOSIS — R531 Weakness: Secondary | ICD-10-CM | POA: Diagnosis not present

## 2019-08-07 DIAGNOSIS — R5383 Other fatigue: Secondary | ICD-10-CM | POA: Diagnosis not present

## 2019-08-07 DIAGNOSIS — Z9221 Personal history of antineoplastic chemotherapy: Secondary | ICD-10-CM | POA: Diagnosis not present

## 2019-08-07 DIAGNOSIS — C3490 Malignant neoplasm of unspecified part of unspecified bronchus or lung: Secondary | ICD-10-CM

## 2019-08-07 DIAGNOSIS — R410 Disorientation, unspecified: Secondary | ICD-10-CM | POA: Diagnosis not present

## 2019-08-07 DIAGNOSIS — C3412 Malignant neoplasm of upper lobe, left bronchus or lung: Secondary | ICD-10-CM | POA: Diagnosis not present

## 2019-08-07 LAB — CBC WITH DIFFERENTIAL (CANCER CENTER ONLY)
Abs Immature Granulocytes: 0.06 10*3/uL (ref 0.00–0.07)
Basophils Absolute: 0 10*3/uL (ref 0.0–0.1)
Basophils Relative: 1 %
Eosinophils Absolute: 0.1 10*3/uL (ref 0.0–0.5)
Eosinophils Relative: 2 %
HCT: 25.6 % — ABNORMAL LOW (ref 39.0–52.0)
Hemoglobin: 7.6 g/dL — ABNORMAL LOW (ref 13.0–17.0)
Immature Granulocytes: 1 %
Lymphocytes Relative: 9 %
Lymphs Abs: 0.6 10*3/uL — ABNORMAL LOW (ref 0.7–4.0)
MCH: 31.1 pg (ref 26.0–34.0)
MCHC: 29.7 g/dL — ABNORMAL LOW (ref 30.0–36.0)
MCV: 104.9 fL — ABNORMAL HIGH (ref 80.0–100.0)
Monocytes Absolute: 0.6 10*3/uL (ref 0.1–1.0)
Monocytes Relative: 9 %
Neutro Abs: 4.9 10*3/uL (ref 1.7–7.7)
Neutrophils Relative %: 78 %
Platelet Count: 168 10*3/uL (ref 150–400)
RBC: 2.44 MIL/uL — ABNORMAL LOW (ref 4.22–5.81)
RDW: 19.4 % — ABNORMAL HIGH (ref 11.5–15.5)
WBC Count: 6.3 10*3/uL (ref 4.0–10.5)
nRBC: 0.6 % — ABNORMAL HIGH (ref 0.0–0.2)

## 2019-08-07 LAB — CMP (CANCER CENTER ONLY)
ALT: 42 U/L (ref 0–44)
AST: 15 U/L (ref 15–41)
Albumin: 2.4 g/dL — ABNORMAL LOW (ref 3.5–5.0)
Alkaline Phosphatase: 65 U/L (ref 38–126)
Anion gap: 9 (ref 5–15)
BUN: 20 mg/dL (ref 8–23)
CO2: 23 mmol/L (ref 22–32)
Calcium: 8.5 mg/dL — ABNORMAL LOW (ref 8.9–10.3)
Chloride: 109 mmol/L (ref 98–111)
Creatinine: 1.33 mg/dL — ABNORMAL HIGH (ref 0.61–1.24)
GFR, Est AFR Am: >60 mL/min (ref >60)
GFR, Estimated: 55 mL/min — ABNORMAL LOW (ref >60)
Glucose, Bld: 94 mg/dL (ref 70–99)
Potassium: 3.3 mmol/L — ABNORMAL LOW (ref 3.5–5.1)
Sodium: 141 mmol/L (ref 135–145)
Total Bilirubin: 0.9 mg/dL (ref 0.3–1.2)
Total Protein: 6.1 g/dL — ABNORMAL LOW (ref 6.5–8.1)

## 2019-08-08 ENCOUNTER — Other Ambulatory Visit: Payer: Self-pay | Admitting: Medical Oncology

## 2019-08-08 ENCOUNTER — Telehealth: Payer: Self-pay | Admitting: Internal Medicine

## 2019-08-08 DIAGNOSIS — J449 Chronic obstructive pulmonary disease, unspecified: Secondary | ICD-10-CM | POA: Diagnosis not present

## 2019-08-08 DIAGNOSIS — R531 Weakness: Secondary | ICD-10-CM | POA: Diagnosis not present

## 2019-08-08 DIAGNOSIS — E1151 Type 2 diabetes mellitus with diabetic peripheral angiopathy without gangrene: Secondary | ICD-10-CM | POA: Diagnosis not present

## 2019-08-08 DIAGNOSIS — M109 Gout, unspecified: Secondary | ICD-10-CM | POA: Diagnosis not present

## 2019-08-08 DIAGNOSIS — I2510 Atherosclerotic heart disease of native coronary artery without angina pectoris: Secondary | ICD-10-CM | POA: Diagnosis not present

## 2019-08-08 DIAGNOSIS — R410 Disorientation, unspecified: Secondary | ICD-10-CM | POA: Diagnosis not present

## 2019-08-08 DIAGNOSIS — I130 Hypertensive heart and chronic kidney disease with heart failure and stage 1 through stage 4 chronic kidney disease, or unspecified chronic kidney disease: Secondary | ICD-10-CM | POA: Diagnosis not present

## 2019-08-08 DIAGNOSIS — N1831 Chronic kidney disease, stage 3a: Secondary | ICD-10-CM | POA: Diagnosis not present

## 2019-08-08 DIAGNOSIS — E1140 Type 2 diabetes mellitus with diabetic neuropathy, unspecified: Secondary | ICD-10-CM | POA: Diagnosis not present

## 2019-08-08 DIAGNOSIS — R05 Cough: Secondary | ICD-10-CM | POA: Diagnosis not present

## 2019-08-08 DIAGNOSIS — D649 Anemia, unspecified: Secondary | ICD-10-CM

## 2019-08-08 DIAGNOSIS — C3412 Malignant neoplasm of upper lobe, left bronchus or lung: Secondary | ICD-10-CM | POA: Diagnosis not present

## 2019-08-08 DIAGNOSIS — R5383 Other fatigue: Secondary | ICD-10-CM | POA: Diagnosis not present

## 2019-08-08 DIAGNOSIS — I5022 Chronic systolic (congestive) heart failure: Secondary | ICD-10-CM | POA: Diagnosis not present

## 2019-08-08 DIAGNOSIS — M159 Polyosteoarthritis, unspecified: Secondary | ICD-10-CM | POA: Diagnosis not present

## 2019-08-08 DIAGNOSIS — I5020 Unspecified systolic (congestive) heart failure: Secondary | ICD-10-CM | POA: Diagnosis not present

## 2019-08-08 DIAGNOSIS — Z9221 Personal history of antineoplastic chemotherapy: Secondary | ICD-10-CM | POA: Diagnosis not present

## 2019-08-08 NOTE — Telephone Encounter (Signed)
Scheduled appt per 2/15 sch message - pt is aware of change . Transportation contacted.

## 2019-08-09 ENCOUNTER — Inpatient Hospital Stay (HOSPITAL_BASED_OUTPATIENT_CLINIC_OR_DEPARTMENT_OTHER): Payer: Medicare HMO | Admitting: Internal Medicine

## 2019-08-09 ENCOUNTER — Telehealth: Payer: Self-pay | Admitting: Medical Oncology

## 2019-08-09 ENCOUNTER — Inpatient Hospital Stay: Payer: Medicare HMO

## 2019-08-09 ENCOUNTER — Encounter: Payer: Self-pay | Admitting: Internal Medicine

## 2019-08-09 ENCOUNTER — Other Ambulatory Visit: Payer: Self-pay

## 2019-08-09 VITALS — BP 127/63 | HR 69 | Temp 98.8°F | Resp 17 | Ht 67.0 in | Wt 197.9 lb

## 2019-08-09 DIAGNOSIS — J449 Chronic obstructive pulmonary disease, unspecified: Secondary | ICD-10-CM | POA: Diagnosis not present

## 2019-08-09 DIAGNOSIS — D649 Anemia, unspecified: Secondary | ICD-10-CM

## 2019-08-09 DIAGNOSIS — D638 Anemia in other chronic diseases classified elsewhere: Secondary | ICD-10-CM | POA: Insufficient documentation

## 2019-08-09 DIAGNOSIS — I10 Essential (primary) hypertension: Secondary | ICD-10-CM | POA: Diagnosis not present

## 2019-08-09 DIAGNOSIS — R410 Disorientation, unspecified: Secondary | ICD-10-CM | POA: Diagnosis not present

## 2019-08-09 DIAGNOSIS — Z9221 Personal history of antineoplastic chemotherapy: Secondary | ICD-10-CM | POA: Diagnosis not present

## 2019-08-09 DIAGNOSIS — C3412 Malignant neoplasm of upper lobe, left bronchus or lung: Secondary | ICD-10-CM

## 2019-08-09 DIAGNOSIS — I130 Hypertensive heart and chronic kidney disease with heart failure and stage 1 through stage 4 chronic kidney disease, or unspecified chronic kidney disease: Secondary | ICD-10-CM | POA: Diagnosis not present

## 2019-08-09 DIAGNOSIS — R05 Cough: Secondary | ICD-10-CM | POA: Diagnosis not present

## 2019-08-09 DIAGNOSIS — R5383 Other fatigue: Secondary | ICD-10-CM | POA: Diagnosis not present

## 2019-08-09 DIAGNOSIS — I5022 Chronic systolic (congestive) heart failure: Secondary | ICD-10-CM | POA: Diagnosis not present

## 2019-08-09 DIAGNOSIS — J438 Other emphysema: Secondary | ICD-10-CM

## 2019-08-09 DIAGNOSIS — C3492 Malignant neoplasm of unspecified part of left bronchus or lung: Secondary | ICD-10-CM

## 2019-08-09 DIAGNOSIS — R531 Weakness: Secondary | ICD-10-CM | POA: Diagnosis not present

## 2019-08-09 LAB — PREPARE RBC (CROSSMATCH)

## 2019-08-09 LAB — SAMPLE TO BLOOD BANK

## 2019-08-09 LAB — ABO/RH: ABO/RH(D): O POS

## 2019-08-09 MED ORDER — DIPHENHYDRAMINE HCL 25 MG PO CAPS
ORAL_CAPSULE | ORAL | Status: AC
  Filled 2019-08-09: qty 1

## 2019-08-09 MED ORDER — SODIUM CHLORIDE 0.9% IV SOLUTION
250.0000 mL | Freq: Once | INTRAVENOUS | Status: DC
  Filled 2019-08-09: qty 250

## 2019-08-09 MED ORDER — DIPHENHYDRAMINE HCL 25 MG PO CAPS
25.0000 mg | ORAL_CAPSULE | Freq: Once | ORAL | Status: AC
  Administered 2019-08-09: 10:00:00 25 mg via ORAL

## 2019-08-09 MED ORDER — ACETAMINOPHEN 325 MG PO TABS
ORAL_TABLET | ORAL | Status: AC
  Filled 2019-08-09: qty 2

## 2019-08-09 MED ORDER — ACETAMINOPHEN 325 MG PO TABS
650.0000 mg | ORAL_TABLET | Freq: Once | ORAL | Status: AC
  Administered 2019-08-09: 10:00:00 650 mg via ORAL

## 2019-08-09 NOTE — Patient Instructions (Signed)

## 2019-08-09 NOTE — Progress Notes (Signed)
  Radiation Oncology         [phone]) [phone] ________________________________  Name: [name] [name]. MRN: [id number]  Date: 06/29/2019  DOB: 15-Aug-1950  End of Treatment Note  Diagnosis:  Lung cancer     Indication for treatment::  curative       Radiation treatment dates:   05/08/19 - 06/29/19  Site/dose:   The patient was treated to the disease within the left lung initially to a dose of 60 Gy using a 5 field, 3-D conformal technique. The patient then received a cone down boost treatment for an additional 6 Gy. This yielded a final total dose of 66 Gy.   Narrative: The patient tolerated radiation treatment relatively well.   The patient did experience esophagitis during the course of treatment which required management.   Plan: The patient has completed radiation treatment. The patient will return to radiation oncology clinic for routine followup in one month. I advised the patient to call or return sooner if they have any questions or concerns related to their recovery or treatment. ________________________________  Jodelle Gross, M.D., Ph.D.

## 2019-08-09 NOTE — Progress Notes (Signed)
[facility] [phone]   [phone]  OFFICE PROGRESS NOTE  [name] Bender, PA-C [place] [address]  DIAGNOSIS: stage IIIc (T2 a, N3, M0) non-small cell lung cancer, squamous cell carcinoma diagnosed in October 2020 and presented with left upper lobe pleural-based mass in addition to left hilar and right paratracheal lymphadenopathy.  PRIOR THERAPY:Concurrent chemoradiation with weekly carboplatin for AUC of 2 and paclitaxel 45 mg/M2.  First dose was on May 08, 2019.  Status post 7 cycles.  .  CURRENT THERAPY: None.  INTERVAL HISTORY: [name] [name]. 69 y.o. male returns to the clinic today for follow-up visit.  His sister was available by phone during the visit.  The patient is feeling fine today with no concerning complaints except for mild fatigue.  He denied having any current chest pain, shortness of breath but has mild cough with no hemoptysis.  He denied having any fever or chills.  He has no nausea, vomiting, diarrhea or constipation.  He denied having any headache or visual changes.  He was supposed to have repeat CT scan of the chest performed before this visit but unfortunately his scan was not scheduled as ordered.  MEDICAL HISTORY: Past Medical History:  Diagnosis Date  . AICD (automatic cardioverter/defibrillator) present    reports no shocks  - st Jude  . Ambulates with cane   . CAD (coronary artery disease)    a. 04/2013 Cath: LM 10, LAD 40p, 79m, D1 50p, LCX 60p, 10m/100m, OM1 50, OM2 100 L-L collats, RCA 100p;  b. CABG x 5: LIMA->LAD, VG->D2, VG->RI->OM2, VG->PDA.  . Cataracts, bilateral   . Chronic systolic CHF (congestive heart failure) ([facility])    a. 04/2013 Echo: EF 15-20%, diff HK, sev HK of inf and ant myocardium, dilated LA,  b. EF 20-25% with restrictive filling pattern, mod RV dysfx, mild MR (10/10/2013);  c.  Echo 12/15: EF 25-30%  . COPD (chronic obstructive pulmonary disease) ([facility])   . Diabetes  mellitus without complication ([facility])    unsure what type but was dx as an adult, takes no meds, check his cbg at home 2 times a week   . Emphysema   . GERD (gastroesophageal reflux disease)   . Gout   . Hyperlipidemia   . Hypertension   . Ischemic cardiomyopathy   . lung ca   . PVD (peripheral vascular disease) ([facility])    a. (10/2013) ABIs: RIGHT 0.63, Waveforms: monophasic;  LEFT 0.78, Waveforms: monophasic  . Tobacco abuse    30+ pack-year history  . Transaminitis    a. 04/2013 Abd U/S and CT unremarkable, hepatitis panel neg -->felt to be 2/2 acute R heart failure.    ALLERGIES:  is allergic to penicillins.  MEDICATIONS:  Current Outpatient Medications  Medication Sig Dispense Refill  . albuterol (VENTOLIN HFA) 108 (90 Base) MCG/ACT inhaler Inhale 1-2 puffs into the lungs every 6 (six) hours as needed for wheezing or shortness of breath. (Patient not taking: Reported on 07/28/2019) 6.7 g 6  . allopurinol (ZYLOPRIM) 300 MG tablet Take 300 mg by mouth daily.    Marland Kitchen amiodarone (PACERONE) 400 MG tablet Take 0.5 tablets (200 mg total) by mouth daily. 30 tablet 5  . atorvastatin (LIPITOR) 20 MG tablet Take 20 mg by mouth daily.    Marland Kitchen azelastine (OPTIVAR) 0.05 % ophthalmic solution Place 1 drop into the left eye 2 (two) times daily.   0  . b complex vitamins tablet Take  1 tablet by mouth daily.    Marland Kitchen ELIQUIS 5 MG TABS tablet     . furosemide (LASIX) 40 MG tablet Take 40 mg by mouth 2 (two) times daily.     . hydrALAZINE (APRESOLINE) 25 MG tablet TAKE 0.5 TABLETS (12.5 MG TOTAL) BY MOUTH 3 (THREE) TIMES DAILY. 45 tablet 3  . metoprolol succinate (TOPROL-XL) 50 MG 24 hr tablet     . omeprazole (PRILOSEC) 40 MG capsule Take 40 mg by mouth daily.    . Potassium Chloride ER 20 MEQ TBCR Take 20 mEq by mouth daily.     . potassium chloride SA (KLOR-CON) 20 MEQ tablet Take 1 tablet (20 mEq total) by mouth daily. 2 tablet 0  . senna (SENOKOT) 8.6 MG TABS tablet Take 1 tablet (8.6 mg total) by mouth 2  (two) times daily. 120 each 0  . spironolactone (ALDACTONE) 25 MG tablet Take 1 tablet (25 mg total) by mouth daily. 90 tablet 1  . sucralfate (CARAFATE) 1 g tablet Take 1 tablet (1 g total) by mouth 4 (four) times daily -  with meals and at bedtime. 5 min before meals for radiation induced esophagitis 120 tablet 2   No current facility-administered medications for this visit.    SURGICAL HISTORY:  Past Surgical History:  Procedure Laterality Date  . APPENDECTOMY  as a child  . CARDIAC CATHETERIZATION  04/08/2015   Procedure: Left Heart Cath and Cors/Grafts Angiography;  Surgeon: [name] M Martinique, MD;  Location: [facility];  Service: Cardiovascular;;  . COLONOSCOPY    . CORONARY ARTERY BYPASS GRAFT N/A 05/05/2013   Procedure: CORONARY ARTERY BYPASS GRAFTING (CABG) TIMES FIVE USING LEFT INTERNAL MAMMARY ARTERY AND RIGHT AND LEFT SAPHENOUS LEG VEIN HARVESTED ENDOSCOPICALLY;  Surgeon: [name] Nakayama, MD;  Location: [facility];  Service: Open Heart Surgery;  Laterality: N/A;  . IMPLANTABLE CARDIOVERTER DEFIBRILLATOR IMPLANT N/A 07/06/2014   Procedure: St Jude IMPLANTABLE CARDIOVERTER DEFIBRILLATOR IMPLANT;  Surgeon: [name] Sprang, MD;  Location: [facility] CATH [facility];  Service: Cardiovascular;  Laterality: N/A;  . LEFT AND RIGHT HEART CATHETERIZATION WITH CORONARY ANGIOGRAM N/A 04/28/2013   Procedure: LEFT AND RIGHT HEART CATHETERIZATION WITH CORONARY ANGIOGRAM;  Surgeon: [name] Blanks, MD;  Location: [facility] CATH LAB;  Service: Cardiovascular;  Laterality: N/A;  . MULTIPLE EXTRACTIONS WITH ALVEOLOPLASTY N/A 05/03/2013   Procedure: Extraction of tooth #s 3,4,5,6,8,9,27 with alveoloplast and maxillary left osseous tuberosity reduction;  Surgeon: [name] [name], DDS;  Location: [facility];  Service: Oral Surgery;  Laterality: N/A;  . TOTAL HIP ARTHROPLASTY Right 11/30/2018   Procedure: TOTAL HIP ARTHROPLASTY ANTERIOR APPROACH;  Surgeon: [name] Can, MD;  Location: [facility];  Service: Orthopedics;   Laterality: Right;  Marland Kitchen VIDEO BRONCHOSCOPY WITH ENDOBRONCHIAL ULTRASOUND N/A 04/20/2019   Procedure: VIDEO BRONCHOSCOPY WITH ENDOBRONCHIAL ULTRASOUND;  Surgeon: Margaretha Seeds, MD;  Location: [facility];  Service: Thoracic;  Laterality: N/A;    REVIEW OF SYSTEMS:  Constitutional: positive for fatigue Eyes: negative Ears, nose, mouth, throat, and face: negative Respiratory: positive for cough Cardiovascular: negative Gastrointestinal: negative Genitourinary:negative Integument/breast: negative Hematologic/lymphatic: negative Musculoskeletal:negative Neurological: negative Behavioral/Psych: negative Endocrine: negative Allergic/Immunologic: negative   PHYSICAL EXAMINATION: General appearance: alert, cooperative, fatigued and no distress Head: Normocephalic, without obvious abnormality, atraumatic Neck: no adenopathy, no JVD, supple, symmetrical, trachea midline and thyroid not enlarged, symmetric, no tenderness/mass/nodules Lymph nodes: Cervical, supraclavicular, and axillary nodes normal. Resp: clear to auscultation bilaterally Back: symmetric, no curvature. ROM normal. No CVA tenderness. Cardio: regular rate and rhythm,  S1, S2 normal, no murmur, click, rub or gallop GI: soft, non-tender; bowel sounds normal; no masses,  no organomegaly Extremities: extremities normal, atraumatic, no cyanosis or edema Neurologic: Alert and oriented X 3, normal strength and tone. Normal symmetric reflexes. Normal coordination and gait  ECOG PERFORMANCE STATUS: 1 - Symptomatic but completely ambulatory  Blood pressure 127/63, pulse 69, temperature 98.8 F (37.1 C), temperature source Oral, resp. rate 17, height 5\' 7"  (1.702 m), weight 197 lb 14.4 oz (89.8 kg), SpO2 99 %.  LABORATORY DATA: Lab Results  Component Value Date   WBC 6.3 08/07/2019   HGB 7.6 (L) 08/07/2019   HCT 25.6 (L) 08/07/2019   MCV 104.9 (H) 08/07/2019   PLT 168 08/07/2019      Chemistry      Component Value Date/Time   NA  141 08/07/2019 0852   NA 141 07/26/2019 1044   K 3.3 (L) 08/07/2019 0852   CL 109 08/07/2019 0852   CO2 23 08/07/2019 0852   BUN 20 08/07/2019 0852   BUN 26 07/26/2019 1044   CREATININE 1.33 (H) 08/07/2019 0852   CREATININE 1.35 (H) 12/25/2015 1310      Component Value Date/Time   CALCIUM 8.5 (L) 08/07/2019 0852   ALKPHOS 65 08/07/2019 0852   AST 15 08/07/2019 0852   ALT 42 08/07/2019 0852   BILITOT 0.9 08/07/2019 0852       RADIOGRAPHIC STUDIES: ECHOCARDIOGRAM COMPLETE  Result Date: 08/04/2019    ECHOCARDIOGRAM REPORT   Patient Name:   [name] [name]. Date of Exam: 08/04/2019 Medical Rec #:  [id number]      Height:       67.0 in Accession #:    [id number]     Weight:       198.0 lb Date of Birth:  [birth year]       BSA:          2.01 m Patient Age:    37 years       BP:           106/72 mmHg Patient Gender: M              HR:           74 bpm. Exam Location:  Outpatient Procedure: 2D Echo Indications:    congestive heart failure  History:        Patient has prior history of Echocardiogram examinations, most                 recent 06/08/2018. Pacemaker.  Sonographer:    Johny Chess RDCS Referring Phys: [place]  1. Left ventricular ejection fraction, by estimation, is 30 to 35%. The left ventricle has moderately decreased function. The left ventricle demonstrates global hypokinesis. There is mildly increased left ventricular hypertrophy. Left ventricular diastolic parameters are consistent with Grade III diastolic dysfunction (restrictive). Elevated left ventricular end-diastolic pressure. There is severe akinesis of the left ventricular, entire inferior wall.  2. Right ventricular systolic function is mildly reduced. The right ventricular size is normal. There is moderately elevated pulmonary artery systolic pressure.  3. Left atrial size was moderately dilated.  4. Posterior tethering of the mitral valve leaflets.  5. The mitral valve is abnormal. Moderate to severe  mitral valve regurgitation.  6. Tricuspid valve regurgitation is moderate.  7. The aortic valve is tricuspid. Aortic valve regurgitation is not visualized.  8. The inferior vena cava is normal in size with greater than 50% respiratory variability, suggesting right  atrial pressure of 3 mmHg. Comparison(s): Changes from prior study are noted. LVEF may be slightly worse. FINDINGS  Left Ventricle: Left ventricular ejection fraction, by estimation, is 30 to 35%. The left ventricle has moderately decreased function. The left ventricle demonstrates global hypokinesis. Severe akinesis of the left ventricular, entire inferior wall. There is mildly increased left ventricular hypertrophy. Left ventricular diastolic parameters are consistent with Grade III diastolic dysfunction (restrictive). Elevated left ventricular end-diastolic pressure. Right Ventricle: The right ventricular size is normal. No increase in right ventricular wall thickness. Right ventricular systolic function is mildly reduced. There is moderately elevated pulmonary artery systolic pressure. The tricuspid regurgitant velocity is 3.57 m/s, and with an assumed right atrial pressure of 3 mmHg, the estimated right ventricular systolic pressure is 32.2 mmHg. Left Atrium: Left atrial size was moderately dilated. Right Atrium: Right atrial size was normal in size. Pericardium: There is no evidence of pericardial effusion. Mitral Valve: The mitral valve is abnormal. There is moderate thickening of the mitral valve leaflet(s). Posterior tethering of the mitral valve leaflets. Moderate to severe mitral valve regurgitation. Tricuspid Valve: The tricuspid valve is not well visualized. Tricuspid valve regurgitation is moderate. Aortic Valve: The aortic valve is tricuspid. Aortic valve regurgitation is not visualized. Pulmonic Valve: The pulmonic valve was grossly normal. Pulmonic valve regurgitation is not visualized. Aorta: The aortic root, ascending aorta, aortic arch  and descending aorta are all structurally normal, with no evidence of dilitation or obstruction. Venous: The inferior vena cava is normal in size with greater than 50% respiratory variability, suggesting right atrial pressure of 3 mmHg. IAS/Shunts: No atrial level shunt detected by color flow Doppler. Additional Comments: A pacer wire is visualized.  LEFT VENTRICLE PLAX 2D LVIDd:         5.10 cm      Diastology LVIDs:         4.30 cm      LV e' lateral:   8.27 cm/s LV PW:         1.20 cm      LV E/e' lateral: 18.7 LV IVS:        1.20 cm      LV e' medial:    5.87 cm/s LVOT diam:     1.90 cm      LV E/e' medial:  26.4 LV SV:         47.63 ml LV SV Index:   19.51 LVOT Area:     2.84 cm  LV Volumes (MOD) LV vol d, MOD A4C: 145.0 ml LV vol s, MOD A4C: 104.0 ml LV SV MOD A4C:     145.0 ml RIGHT VENTRICLE RV S prime:     8.92 cm/s TAPSE (M-mode): 1.7 cm LEFT ATRIUM             Index       RIGHT ATRIUM           Index LA diam:        4.80 cm 2.38 cm/m  RA Area:     20.60 cm LA Vol (A2C):   95.9 ml 47.63 ml/m RA Volume:   59.20 ml  29.40 ml/m LA Vol (A4C):   76.8 ml 38.14 ml/m LA Biplane Vol: 89.1 ml 44.25 ml/m  AORTIC VALVE LVOT Vmax:   90.50 cm/s LVOT Vmean:  58.900 cm/s LVOT VTI:    0.168 m  AORTA Ao Root diam: 3.00 cm MITRAL VALVE  TRICUSPID VALVE MV Area (PHT): 4.21 cm      TR Peak grad:   51.0 mmHg MV Decel Time: 180 msec      TR Vmax:        357.00 cm/s MR Peak grad:    104.4 mmHg MR Mean grad:    58.0 mmHg   SHUNTS MR Vmax:         511.00 cm/s Systemic VTI:  0.17 m MR Vmean:        346.0 cm/s  Systemic Diam: 1.90 cm MR PISA:         1.57 cm MR PISA Eff ROA: 12 mm MR PISA Radius:  0.50 cm MV E velocity: 155.00 cm/s MV A velocity: 49.00 cm/s MV E/A ratio:  3.16 Lyman Bishop MD Electronically signed by Lyman Bishop MD Signature Date/Time: 08/04/2019/11:40:21 AM    Final    CUP PACEART INCLINIC DEVICE CHECK  Result Date: 07/27/2019 ICD check in clinic. Normal device function. Thresholds and  sensing consistent with previous device measurements. Impedance trends stable over time. Pt shocked on 1/9 and 1/11. Both episodes appear inappropriately treated. SVT discriminators disagreed due to "sudden onset". Currently set up to shock if "any" read as VT. Programmed to "ALL" must read VT per discussion with Dr. Caryl Comes. Histogram distribution appropriate for patient and level of activity. Device programmed at appropriate safety margins. Estimated longevity 4 yr, 8 mo. Pt enrolled in remote follow-up. Patient education completed including shock plan. Auditory/vibratory alert demonstrated.   ASSESSMENT AND PLAN: This is a very pleasant 68 years old male with stage IIIc non-small cell lung cancer, squamous cell carcinoma presented with left upper lobe pleural-based mass in addition to left hilar and right paramediastinal lymphadenopathy. The patient underwent a course of concurrent chemoradiation with weekly carboplatin and paclitaxel status post 7 cycles.   The patient has been tolerating his treatment well with no concerning adverse effects except for fatigue and odynophagia. The patient is feeling fine today with no concerning complaints except for fatigue. He was supposed to have repeat CT scan of the chest before this visit but unfortunately it was not scheduled as ordered.  I will arrange for the patient to have repeat CT scan of the chest performed in the next few days. For the anemia of chronic disease, I will arrange for the patient to receive 2 units of PRBCs transfusion today. I will see the patient back for follow-up visit next week after his scan for further evaluation and discussion of his treatment options. He was advised to call immediately if he has any concerning symptoms in the interval. The patient voices understanding of current disease status and treatment options and is in agreement with the current care plan.  All questions were answered. The patient knows to call the clinic  with any problems, questions or concerns. We [name] certainly see the patient much sooner if necessary.  Disclaimer: This note was dictated with voice recognition software. Similar sounding words [name] inadvertently be transcribed and may not be corrected upon review.

## 2019-08-09 NOTE — Telephone Encounter (Signed)
[facility] notified that pt has CT scan Friday . She is arranging transportation for the scan appt.. Pt notified of CT scan appt.

## 2019-08-09 NOTE — Telephone Encounter (Signed)
[facility] home health and requested notes from RN . His medication is prepared by a nurse for him to take.

## 2019-08-09 NOTE — Progress Notes (Signed)
[facility] handling med administration . I reviewed faxed list and note from Home health RN.

## 2019-08-10 DIAGNOSIS — E1140 Type 2 diabetes mellitus with diabetic neuropathy, unspecified: Secondary | ICD-10-CM | POA: Diagnosis not present

## 2019-08-10 DIAGNOSIS — I2510 Atherosclerotic heart disease of native coronary artery without angina pectoris: Secondary | ICD-10-CM | POA: Diagnosis not present

## 2019-08-10 DIAGNOSIS — M109 Gout, unspecified: Secondary | ICD-10-CM | POA: Diagnosis not present

## 2019-08-10 DIAGNOSIS — E1151 Type 2 diabetes mellitus with diabetic peripheral angiopathy without gangrene: Secondary | ICD-10-CM | POA: Diagnosis not present

## 2019-08-10 DIAGNOSIS — N1831 Chronic kidney disease, stage 3a: Secondary | ICD-10-CM | POA: Diagnosis not present

## 2019-08-10 DIAGNOSIS — I130 Hypertensive heart and chronic kidney disease with heart failure and stage 1 through stage 4 chronic kidney disease, or unspecified chronic kidney disease: Secondary | ICD-10-CM | POA: Diagnosis not present

## 2019-08-10 DIAGNOSIS — M159 Polyosteoarthritis, unspecified: Secondary | ICD-10-CM | POA: Diagnosis not present

## 2019-08-10 DIAGNOSIS — I5020 Unspecified systolic (congestive) heart failure: Secondary | ICD-10-CM | POA: Diagnosis not present

## 2019-08-10 DIAGNOSIS — J449 Chronic obstructive pulmonary disease, unspecified: Secondary | ICD-10-CM | POA: Diagnosis not present

## 2019-08-10 LAB — TYPE AND SCREEN
ABO/RH(D): O POS
Antibody Screen: NEGATIVE
Unit division: 0
Unit division: 0

## 2019-08-10 LAB — BPAM RBC
Blood Product Expiration Date: 202102252359
Blood Product Expiration Date: 202103192359
ISSUE DATE / TIME: 202102171006
ISSUE DATE / TIME: 202102171006
Unit Type and Rh: 5100
Unit Type and Rh: 5100

## 2019-08-11 ENCOUNTER — Ambulatory Visit (HOSPITAL_COMMUNITY)
Admission: RE | Admit: 2019-08-11 | Discharge: 2019-08-11 | Disposition: A | Discharge status: Home / Self Care | Payer: Medicare HMO | Source: Ambulatory Visit | Attending: Internal Medicine | Admitting: Internal Medicine

## 2019-08-11 ENCOUNTER — Telehealth: Payer: Self-pay | Admitting: Internal Medicine

## 2019-08-11 ENCOUNTER — Other Ambulatory Visit: Payer: Self-pay

## 2019-08-11 ENCOUNTER — Encounter (HOSPITAL_COMMUNITY): Payer: Self-pay

## 2019-08-11 DIAGNOSIS — C3490 Malignant neoplasm of unspecified part of unspecified bronchus or lung: Secondary | ICD-10-CM

## 2019-08-11 NOTE — Telephone Encounter (Signed)
Scheduled per los. Called and spoke with patient. Confirmed appt 

## 2019-08-14 ENCOUNTER — Other Ambulatory Visit: Payer: Self-pay

## 2019-08-14 ENCOUNTER — Encounter: Payer: Self-pay | Admitting: Internal Medicine

## 2019-08-14 ENCOUNTER — Inpatient Hospital Stay (HOSPITAL_BASED_OUTPATIENT_CLINIC_OR_DEPARTMENT_OTHER): Payer: Medicare HMO | Admitting: Internal Medicine

## 2019-08-14 VITALS — BP 103/59 | HR 74 | Temp 98.0°F | Resp 18 | Ht 67.0 in | Wt 193.7 lb

## 2019-08-14 DIAGNOSIS — I509 Heart failure, unspecified: Secondary | ICD-10-CM | POA: Diagnosis not present

## 2019-08-14 DIAGNOSIS — J449 Chronic obstructive pulmonary disease, unspecified: Secondary | ICD-10-CM | POA: Diagnosis not present

## 2019-08-14 DIAGNOSIS — R05 Cough: Secondary | ICD-10-CM | POA: Diagnosis not present

## 2019-08-14 DIAGNOSIS — C3412 Malignant neoplasm of upper lobe, left bronchus or lung: Secondary | ICD-10-CM | POA: Diagnosis not present

## 2019-08-14 DIAGNOSIS — R590 Localized enlarged lymph nodes: Secondary | ICD-10-CM

## 2019-08-14 DIAGNOSIS — Z79899 Other long term (current) drug therapy: Secondary | ICD-10-CM

## 2019-08-14 DIAGNOSIS — C3492 Malignant neoplasm of unspecified part of left bronchus or lung: Secondary | ICD-10-CM | POA: Diagnosis not present

## 2019-08-14 DIAGNOSIS — Z923 Personal history of irradiation: Secondary | ICD-10-CM

## 2019-08-14 DIAGNOSIS — D638 Anemia in other chronic diseases classified elsewhere: Secondary | ICD-10-CM | POA: Diagnosis not present

## 2019-08-14 DIAGNOSIS — R531 Weakness: Secondary | ICD-10-CM | POA: Diagnosis not present

## 2019-08-14 DIAGNOSIS — R918 Other nonspecific abnormal finding of lung field: Secondary | ICD-10-CM | POA: Diagnosis not present

## 2019-08-14 DIAGNOSIS — I5022 Chronic systolic (congestive) heart failure: Secondary | ICD-10-CM | POA: Diagnosis not present

## 2019-08-14 DIAGNOSIS — Z9221 Personal history of antineoplastic chemotherapy: Secondary | ICD-10-CM

## 2019-08-14 DIAGNOSIS — Z7189 Other specified counseling: Secondary | ICD-10-CM

## 2019-08-14 DIAGNOSIS — R32 Unspecified urinary incontinence: Secondary | ICD-10-CM | POA: Diagnosis not present

## 2019-08-14 DIAGNOSIS — R5383 Other fatigue: Secondary | ICD-10-CM | POA: Diagnosis not present

## 2019-08-14 DIAGNOSIS — C3490 Malignant neoplasm of unspecified part of unspecified bronchus or lung: Secondary | ICD-10-CM | POA: Diagnosis not present

## 2019-08-14 DIAGNOSIS — I130 Hypertensive heart and chronic kidney disease with heart failure and stage 1 through stage 4 chronic kidney disease, or unspecified chronic kidney disease: Secondary | ICD-10-CM | POA: Diagnosis not present

## 2019-08-14 DIAGNOSIS — R410 Disorientation, unspecified: Secondary | ICD-10-CM | POA: Diagnosis not present

## 2019-08-14 MED ORDER — PREDNISONE 20 MG PO TABS: ORAL_TABLET | ORAL | 0 refills | Status: DC

## 2019-08-14 MED ORDER — DOXYCYCLINE HYCLATE 100 MG PO TABS: 100.0000 mg | ORAL_TABLET | Freq: Two times a day (BID) | ORAL | 0 refills | Status: DC

## 2019-08-14 NOTE — Progress Notes (Signed)
[place] [phone]   [phone]  OFFICE PROGRESS NOTE  [name], PA-C [place] [address]  DIAGNOSIS: stage IIIc (T2 a, N3, M0) non-small cell lung cancer, squamous cell carcinoma diagnosed in October 2020 and presented with left upper lobe pleural-based mass in addition to left hilar and right paratracheal lymphadenopathy.  PRIOR THERAPY:Concurrent chemoradiation with weekly carboplatin for AUC of 2 and paclitaxel 45 mg/M2.  First dose was on May 08, 2019.  Status post 7 cycles.  .  CURRENT THERAPY: None.  INTERVAL HISTORY: [name] [name]. 69 y.o. male returns to the clinic today for follow-up visit.  His sister was available by phone during the visit.  The patient continues to complain of fatigue and weakness as well as shortness of breath with exertion.  He has mild cough.  He has a history of congestive heart failure and he is currently on treatment with Lasix.  He denied having any current fever or chills.  He has no nausea, vomiting, diarrhea or constipation.  He has no headache or visual changes.  He completed a course of concurrent chemoradiation.  He had repeat CT scan of the chest performed recently and he is here for evaluation and discussion of his discuss results and recommendation regarding treatment of his condition.  MEDICAL HISTORY: Past Medical History:  Diagnosis Date  . AICD (automatic cardioverter/defibrillator) present    reports no shocks  - st Jude  . Ambulates with cane   . CAD (coronary artery disease)    a. 04/2013 Cath: LM 10, LAD 40p, 60m, D1 50p, LCX 60p, 66m/100m, OM1 50, OM2 100 L-L collats, RCA 100p;  b. CABG x 5: LIMA->LAD, VG->D2, VG->RI->OM2, VG->PDA.  . Cataracts, bilateral   . Chronic systolic CHF (congestive heart failure) ([place])    a. 04/2013 Echo: EF 15-20%, diff HK, sev HK of inf and ant myocardium, dilated LA,  b. EF 20-25% with restrictive filling pattern, mod RV dysfx,  mild MR (10/10/2013);  c.  Echo 12/15: EF 25-30%  . COPD (chronic obstructive pulmonary disease) ([place])   . Diabetes mellitus without complication ([place])    unsure what type but was dx as an adult, takes no meds, check his cbg at home 2 times a week   . Emphysema   . GERD (gastroesophageal reflux disease)   . Gout   . Hyperlipidemia   . Hypertension   . Ischemic cardiomyopathy   . lung ca   . PVD (peripheral vascular disease) ([place])    a. (10/2013) ABIs: RIGHT 0.63, Waveforms: monophasic;  LEFT 0.78, Waveforms: monophasic  . Tobacco abuse    30+ pack-year history  . Transaminitis    a. 04/2013 Abd U/S and CT unremarkable, hepatitis panel neg -->felt to be 2/2 acute R heart failure.    ALLERGIES:  is allergic to penicillins.  MEDICATIONS:  Current Outpatient Medications  Medication Sig Dispense Refill  . albuterol (VENTOLIN HFA) 108 (90 Base) MCG/ACT inhaler Inhale 1-2 puffs into the lungs every 6 (six) hours as needed for wheezing or shortness of breath. 6.7 g 6  . allopurinol (ZYLOPRIM) 300 MG tablet Take 300 mg by mouth daily.    Marland Kitchen amiodarone (PACERONE) 400 MG tablet Take 0.5 tablets (200 mg total) by mouth daily. 30 tablet 5  . atorvastatin (LIPITOR) 20 MG tablet Take 20 mg by mouth daily.    Marland Kitchen azelastine (OPTIVAR) 0.05 % ophthalmic solution Place 1 drop into the left eye  2 (two) times daily.   0  . b complex vitamins tablet Take 1 tablet by mouth daily.    . Cholecalciferol (VITAMIN D3) 25 MCG (1000 UT) CAPS Take by mouth.    Arne Cleveland 5 MG TABS tablet     . furosemide (LASIX) 40 MG tablet Take 40 mg by mouth 2 (two) times daily.     Marland Kitchen guaiFENesin-dextromethorphan (ROBITUSSIN DM) 100-10 MG/5ML syrup Take 5 mLs by mouth every 4 (four) hours as needed for cough.    . metoprolol succinate (TOPROL-XL) 50 MG 24 hr tablet     . omeprazole (PRILOSEC) 40 MG capsule Take 40 mg by mouth daily.    Marland Kitchen oxyCODONE-acetaminophen (PERCOCET/ROXICET) 5-325 MG tablet Take by mouth every 4 (four) hours  as needed for severe pain.    . potassium chloride SA (KLOR-CON) 20 MEQ tablet Take 1 tablet (20 mEq total) by mouth daily. 2 tablet 0  . promethazine (PHENERGAN) 25 MG tablet Take 25 mg by mouth every 6 (six) hours as needed for nausea or vomiting.    . senna (SENOKOT) 8.6 MG TABS tablet Take 1 tablet (8.6 mg total) by mouth 2 (two) times daily. 120 each 0  . spironolactone (ALDACTONE) 25 MG tablet Take 1 tablet (25 mg total) by mouth daily. 90 tablet 1  . sucralfate (CARAFATE) 1 g tablet Take 1 tablet (1 g total) by mouth 4 (four) times daily -  with meals and at bedtime. 5 min before meals for radiation induced esophagitis 120 tablet 2  . Thiamine HCl (VITAMIN B-1) 250 MG tablet Take 250 mg by mouth daily.    Marland Kitchen tiZANidine (ZANAFLEX) 4 MG capsule Take 4 mg by mouth 3 (three) times daily.    . hydrALAZINE (APRESOLINE) 25 MG tablet TAKE 0.5 TABLETS (12.5 MG TOTAL) BY MOUTH 3 (THREE) TIMES DAILY. 45 tablet 3   No current facility-administered medications for this visit.    SURGICAL HISTORY:  Past Surgical History:  Procedure Laterality Date  . APPENDECTOMY  as a child  . CARDIAC CATHETERIZATION  04/08/2015   Procedure: Left Heart Cath and Cors/Grafts Angiography;  Surgeon: Peter M Martinique, MD;  Location: [facility];  Service: Cardiovascular;;  . COLONOSCOPY    . CORONARY ARTERY BYPASS GRAFT N/A 05/05/2013   Procedure: CORONARY ARTERY BYPASS GRAFTING (CABG) TIMES FIVE USING LEFT INTERNAL MAMMARY ARTERY AND RIGHT AND LEFT SAPHENOUS LEG VEIN HARVESTED ENDOSCOPICALLY;  Surgeon: Melrose Nakayama, MD;  Location: [facility];  Service: Open Heart Surgery;  Laterality: N/A;  . IMPLANTABLE CARDIOVERTER DEFIBRILLATOR IMPLANT N/A 07/06/2014   Procedure: St Jude IMPLANTABLE CARDIOVERTER DEFIBRILLATOR IMPLANT;  Surgeon: Deboraha Sprang, MD;  Location: [facility] CATH [facility];  Service: Cardiovascular;  Laterality: N/A;  . LEFT AND RIGHT HEART CATHETERIZATION WITH CORONARY ANGIOGRAM N/A 04/28/2013   Procedure: LEFT  AND RIGHT HEART CATHETERIZATION WITH CORONARY ANGIOGRAM;  Surgeon: Burnell Blanks, MD;  Location: [facility];  Service: Cardiovascular;  Laterality: N/A;  . MULTIPLE EXTRACTIONS WITH ALVEOLOPLASTY N/A 05/03/2013   Procedure: Extraction of tooth #s 3,4,5,6,8,9,27 with alveoloplast and maxillary left osseous tuberosity reduction;  Surgeon: Lenn Cal, DDS;  Location: [facility];  Service: Oral Surgery;  Laterality: N/A;  . TOTAL HIP ARTHROPLASTY Right 11/30/2018   Procedure: TOTAL HIP ARTHROPLASTY ANTERIOR APPROACH;  Surgeon: Rod Can, MD;  Location: [facility];  Service: Orthopedics;  Laterality: Right;  Marland Kitchen VIDEO BRONCHOSCOPY WITH ENDOBRONCHIAL ULTRASOUND N/A 04/20/2019   Procedure: VIDEO BRONCHOSCOPY WITH ENDOBRONCHIAL ULTRASOUND;  Surgeon: Loanne Drilling, Chi  Opal Sidles, MD;  Location: [place];  Service: Thoracic;  Laterality: N/A;    REVIEW OF SYSTEMS:  Constitutional: positive for fatigue Eyes: negative Ears, nose, mouth, throat, and face: negative Respiratory: positive for cough and dyspnea on exertion Cardiovascular: negative Gastrointestinal: negative Genitourinary:negative Integument/breast: negative Hematologic/lymphatic: negative Musculoskeletal:positive for muscle weakness Neurological: negative Behavioral/Psych: negative Endocrine: negative Allergic/Immunologic: negative   PHYSICAL EXAMINATION: General appearance: alert, cooperative, fatigued and no distress Head: Normocephalic, without obvious abnormality, atraumatic Neck: no adenopathy, no JVD, supple, symmetrical, trachea midline and thyroid not enlarged, symmetric, no tenderness/mass/nodules Lymph nodes: Cervical, supraclavicular, and axillary nodes normal. Resp: clear to auscultation bilaterally Back: symmetric, no curvature. ROM normal. No CVA tenderness. Cardio: regular rate and rhythm, S1, S2 normal, no murmur, click, rub or gallop GI: soft, non-tender; bowel sounds normal; no masses,  no organomegaly Extremities:  extremities normal, atraumatic, no cyanosis or edema Neurologic: Alert and oriented X 3, normal strength and tone. Normal symmetric reflexes. Normal coordination and gait  ECOG PERFORMANCE STATUS: 1 - Symptomatic but completely ambulatory  Blood pressure (!) 103/59, pulse 74, temperature 98 F (36.7 C), temperature source Temporal, resp. rate 18, height 5\' 7"  (1.702 m), weight 193 lb 11.2 oz (87.9 kg), SpO2 100 %.  LABORATORY DATA: Lab Results  Component Value Date   WBC 6.3 08/07/2019   HGB 7.6 (L) 08/07/2019   HCT 25.6 (L) 08/07/2019   MCV 104.9 (H) 08/07/2019   PLT 168 08/07/2019      Chemistry      Component Value Date/Time   NA 141 08/07/2019 0852   NA 141 07/26/2019 1044   K 3.3 (L) 08/07/2019 0852   CL 109 08/07/2019 0852   CO2 23 08/07/2019 0852   BUN 20 08/07/2019 0852   BUN 26 07/26/2019 1044   CREATININE 1.33 (H) 08/07/2019 0852   CREATININE 1.35 (H) 12/25/2015 1310      Component Value Date/Time   CALCIUM 8.5 (L) 08/07/2019 0852   ALKPHOS 65 08/07/2019 0852   AST 15 08/07/2019 0852   ALT 42 08/07/2019 0852   BILITOT 0.9 08/07/2019 0852       RADIOGRAPHIC STUDIES: CT Chest Wo Contrast  Result Date: 08/11/2019 CLINICAL DATA:  Restaging non-small cell lung cancer. EXAM: CT CHEST WITHOUT CONTRAST TECHNIQUE: Multidetector CT imaging of the chest was performed following the standard protocol without IV contrast. COMPARISON:  06/23/2019. FINDINGS: Cardiovascular: The heart size appears normal. Aortic atherosclerosis. Left chest wall ICD is noted with lead in the right ventricle. Previous median sternotomy and CABG procedure. Mediastinum/Nodes: 1.2 cm left lobe of thyroid gland nodule is identified. The trachea appears patent and is midline. Normal appearance of the esophagus. Right paratracheal lymph node measures 1.1 cm, image 51/2. This is new compared with the previous exam. No supraclavicular or axillary adenopathy. Lungs/Pleura: Small right pleural effusion is  new from previous exam. Mild left posterior pleural thickening is identified. Thickening of the fissures noted bilaterally likely reflecting underlying pleural fluid. Extensive, multifocal patchy confluent areas of ground-glass attenuation and dense interstitial thickening noted involving the upper and lower lobes. This is a new abnormality compared with 06/23/2019. Treated lesion within the lateral left upper lobe with central cavitary component measures 1.5 x 1.3 cm, image 53/5. Previously this measured 2.3 x 1.8 cm. Calcified granuloma is noted within the posterior left upper lobe. Upper Abdomen: No acute abnormality. Musculoskeletal: No chest wall mass or suspicious bone lesions identified. IMPRESSION: 1. Interval development of extensive, multifocal patchy confluent areas of ground-glass attenuation and dense  interstitial thickening involving the upper and lower lobes. Differential considerations include inflammatory/infectious process, CHF with areas of interstitial and alveolar edema, versus hypersensitivity pneumonitis. Atypical viral pneumonia not excluded. Recommend careful clinical correlation. 2. Treated lesion within the lateral left upper lobe is decreased in size in the interval. 3. New small right pleural effusion. Correlate for any clinical signs or symptoms of CHF. 4. Borderline enlarged right paratracheal lymph node is new from previous exam and may be reactive. Attention on follow-up imaging is advised. 5. Aortic atherosclerosis and prior CABG procedure. 6. 1.2 cm left lobe of thyroid gland nodule. No followup recommended (ref: J Am Coll Radiol. 2015 Feb;12(2): 143-50). Aortic Atherosclerosis (ICD10-I70.0). These results will be called to the ordering clinician or representative by the Radiologist Assistant, and communication documented in the PACS or zVision Dashboard. Electronically Signed   By: Kerby Moors M.D.   On: 08/11/2019 14:35   ECHOCARDIOGRAM COMPLETE  Result Date: 08/04/2019     ECHOCARDIOGRAM REPORT   Patient Name:   [name] [name]. Date of Exam: 08/04/2019 Medical Rec #:  [id number]      Height:       67.0 in Accession #:    [id number]     Weight:       198.0 lb Date of Birth:  [birth year]       BSA:          2.01 m Patient Age:    69 years       BP:           106/72 mmHg Patient Gender: M              HR:           74 bpm. Exam Location:  Outpatient Procedure: 2D Echo Indications:    congestive heart failure  History:        Patient has prior history of Echocardiogram examinations, most                 recent 06/08/2018. Pacemaker.  Sonographer:    Johny Chess RDCS Referring Phys: [place]  1. Left ventricular ejection fraction, by estimation, is 30 to 35%. The left ventricle has moderately decreased function. The left ventricle demonstrates global hypokinesis. There is mildly increased left ventricular hypertrophy. Left ventricular diastolic parameters are consistent with Grade III diastolic dysfunction (restrictive). Elevated left ventricular end-diastolic pressure. There is severe akinesis of the left ventricular, entire inferior wall.  2. Right ventricular systolic function is mildly reduced. The right ventricular size is normal. There is moderately elevated pulmonary artery systolic pressure.  3. Left atrial size was moderately dilated.  4. Posterior tethering of the mitral valve leaflets.  5. The mitral valve is abnormal. Moderate to severe mitral valve regurgitation.  6. Tricuspid valve regurgitation is moderate.  7. The aortic valve is tricuspid. Aortic valve regurgitation is not visualized.  8. The inferior vena cava is normal in size with greater than 50% respiratory variability, suggesting right atrial pressure of 3 mmHg. Comparison(s): Changes from prior study are noted. LVEF may be slightly worse. FINDINGS  Left Ventricle: Left ventricular ejection fraction, by estimation, is 30 to 35%. The left ventricle has moderately decreased function. The left  ventricle demonstrates global hypokinesis. Severe akinesis of the left ventricular, entire inferior wall. There is mildly increased left ventricular hypertrophy. Left ventricular diastolic parameters are consistent with Grade III diastolic dysfunction (restrictive). Elevated left ventricular end-diastolic pressure. Right Ventricle: The right ventricular size is normal. No  increase in right ventricular wall thickness. Right ventricular systolic function is mildly reduced. There is moderately elevated pulmonary artery systolic pressure. The tricuspid regurgitant velocity is 3.57 m/s, and with an assumed right atrial pressure of 3 mmHg, the estimated right ventricular systolic pressure is 43.3 mmHg. Left Atrium: Left atrial size was moderately dilated. Right Atrium: Right atrial size was normal in size. Pericardium: There is no evidence of pericardial effusion. Mitral Valve: The mitral valve is abnormal. There is moderate thickening of the mitral valve leaflet(s). Posterior tethering of the mitral valve leaflets. Moderate to severe mitral valve regurgitation. Tricuspid Valve: The tricuspid valve is not well visualized. Tricuspid valve regurgitation is moderate. Aortic Valve: The aortic valve is tricuspid. Aortic valve regurgitation is not visualized. Pulmonic Valve: The pulmonic valve was grossly normal. Pulmonic valve regurgitation is not visualized. Aorta: The aortic root, ascending aorta, aortic arch and descending aorta are all structurally normal, with no evidence of dilitation or obstruction. Venous: The inferior vena cava is normal in size with greater than 50% respiratory variability, suggesting right atrial pressure of 3 mmHg. IAS/Shunts: No atrial level shunt detected by color flow Doppler. Additional Comments: A pacer wire is visualized.  LEFT VENTRICLE PLAX 2D LVIDd:         5.10 cm      Diastology LVIDs:         4.30 cm      LV e' lateral:   8.27 cm/s LV PW:         1.20 cm      LV E/e' lateral: 18.7 LV  IVS:        1.20 cm      LV e' medial:    5.87 cm/s LVOT diam:     1.90 cm      LV E/e' medial:  26.4 LV SV:         47.63 ml LV SV Index:   19.51 LVOT Area:     2.84 cm  LV Volumes (MOD) LV vol d, MOD A4C: 145.0 ml LV vol s, MOD A4C: 104.0 ml LV SV MOD A4C:     145.0 ml RIGHT VENTRICLE RV S prime:     8.92 cm/s TAPSE (M-mode): 1.7 cm LEFT ATRIUM             Index       RIGHT ATRIUM           Index LA diam:        4.80 cm 2.38 cm/m  RA Area:     20.60 cm LA Vol (A2C):   95.9 ml 47.63 ml/m RA Volume:   59.20 ml  29.40 ml/m LA Vol (A4C):   76.8 ml 38.14 ml/m LA Biplane Vol: 89.1 ml 44.25 ml/m  AORTIC VALVE LVOT Vmax:   90.50 cm/s LVOT Vmean:  58.900 cm/s LVOT VTI:    0.168 m  AORTA Ao Root diam: 3.00 cm MITRAL VALVE                 TRICUSPID VALVE MV Area (PHT): 4.21 cm      TR Peak grad:   51.0 mmHg MV Decel Time: 180 msec      TR Vmax:        357.00 cm/s MR Peak grad:    104.4 mmHg MR Mean grad:    58.0 mmHg   SHUNTS MR Vmax:         511.00 cm/s Systemic VTI:  0.17 m MR Vmean:  346.0 cm/s  Systemic Diam: 1.90 cm MR PISA:         1.57 cm MR PISA Eff ROA: 12 mm MR PISA Radius:  0.50 cm MV E velocity: 155.00 cm/s MV A velocity: 49.00 cm/s MV E/A ratio:  3.16 Lyman Bishop MD Electronically signed by Lyman Bishop MD Signature Date/Time: 08/04/2019/11:40:21 AM    Final    CUP PACEART INCLINIC DEVICE CHECK  Result Date: 07/27/2019 ICD check in clinic. Normal device function. Thresholds and sensing consistent with previous device measurements. Impedance trends stable over time. Pt shocked on 1/9 and 1/11. Both episodes appear inappropriately treated. SVT discriminators disagreed due to "sudden onset". Currently set up to shock if "any" read as VT. Programmed to "ALL" must read VT per discussion with Dr. Caryl Comes. Histogram distribution appropriate for patient and level of activity. Device programmed at appropriate safety margins. Estimated longevity 4 yr, 8 mo. Pt enrolled in remote follow-up. Patient  education completed including shock plan. Auditory/vibratory alert demonstrated.   ASSESSMENT AND PLAN: This is a very pleasant 69 years old male with stage IIIc non-small cell lung cancer, squamous cell carcinoma presented with left upper lobe pleural-based mass in addition to left hilar and right paramediastinal lymphadenopathy. The patient underwent a course of concurrent chemoradiation with weekly carboplatin and paclitaxel status post 7 cycles.   He tolerated his treatment well except for odynophagia and fatigue. The patient had repeat CT scan of the chest performed recently.  I personally and independently reviewed the scan images and discussed the results with the patient and his sister today. His scan showed improvement of the left upper lobe lung mass with a stable enlarged right paratracheal lymphadenopathy that could be reactive in nature.  There was also interval development of extensive multifocal patchy confluent areas of groundglass attenuation and dense interstitial thickening involving the upper and lower lobes suspicious for inflammatory/infectious process versus congestive heart failure versus atypical viral pneumonia versus hypersensitivity pneumonitis. I had a lengthy discussion with the patient and his sister about his current condition and treatment options.  I do not think the patient will be a great candidate to start consolidation immunotherapy with his current lung condition. I recommended for the patient to start a tapered dose of prednisone over the next 4 weeks in addition to antibiotic coverage with doxycycline for the next 10 days. For the congestive heart failure, he will continue his current treatment with Lasix as prescribed by his cardiologist. I will see the patient back for follow-up visit in 1 months for reevaluation with repeat CT scan of the chest for reevaluation of his disease before considering him for treatment with immunotherapy. The patient and his sister  agreed to the current plan. He was advised to call immediately if he has any other concerning symptoms in the interval. The patient voices understanding of current disease status and treatment options and is in agreement with the current care plan.  All questions were answered. The patient knows to call the clinic with any problems, questions or concerns. We can certainly see the patient much sooner if necessary.  Disclaimer: This note was dictated with voice recognition software. Similar sounding words can inadvertently be transcribed and may not be corrected upon review.

## 2019-08-15 ENCOUNTER — Telehealth: Payer: Self-pay | Admitting: Internal Medicine

## 2019-08-15 DIAGNOSIS — I2510 Atherosclerotic heart disease of native coronary artery without angina pectoris: Secondary | ICD-10-CM | POA: Diagnosis not present

## 2019-08-15 DIAGNOSIS — E1151 Type 2 diabetes mellitus with diabetic peripheral angiopathy without gangrene: Secondary | ICD-10-CM | POA: Diagnosis not present

## 2019-08-15 DIAGNOSIS — M109 Gout, unspecified: Secondary | ICD-10-CM | POA: Diagnosis not present

## 2019-08-15 DIAGNOSIS — I130 Hypertensive heart and chronic kidney disease with heart failure and stage 1 through stage 4 chronic kidney disease, or unspecified chronic kidney disease: Secondary | ICD-10-CM | POA: Diagnosis not present

## 2019-08-15 DIAGNOSIS — E1140 Type 2 diabetes mellitus with diabetic neuropathy, unspecified: Secondary | ICD-10-CM | POA: Diagnosis not present

## 2019-08-15 DIAGNOSIS — M159 Polyosteoarthritis, unspecified: Secondary | ICD-10-CM | POA: Diagnosis not present

## 2019-08-15 DIAGNOSIS — J449 Chronic obstructive pulmonary disease, unspecified: Secondary | ICD-10-CM | POA: Diagnosis not present

## 2019-08-15 DIAGNOSIS — I5020 Unspecified systolic (congestive) heart failure: Secondary | ICD-10-CM | POA: Diagnosis not present

## 2019-08-15 DIAGNOSIS — N1831 Chronic kidney disease, stage 3a: Secondary | ICD-10-CM | POA: Diagnosis not present

## 2019-08-15 NOTE — Telephone Encounter (Signed)
Scheduled per los. Called and spoke with patient. confimed appt

## 2019-08-16 ENCOUNTER — Encounter (HOSPITAL_COMMUNITY): Payer: Self-pay

## 2019-08-16 DIAGNOSIS — I130 Hypertensive heart and chronic kidney disease with heart failure and stage 1 through stage 4 chronic kidney disease, or unspecified chronic kidney disease: Secondary | ICD-10-CM | POA: Diagnosis not present

## 2019-08-16 DIAGNOSIS — I2510 Atherosclerotic heart disease of native coronary artery without angina pectoris: Secondary | ICD-10-CM | POA: Diagnosis not present

## 2019-08-16 DIAGNOSIS — N1831 Chronic kidney disease, stage 3a: Secondary | ICD-10-CM | POA: Diagnosis not present

## 2019-08-16 DIAGNOSIS — I5020 Unspecified systolic (congestive) heart failure: Secondary | ICD-10-CM | POA: Diagnosis not present

## 2019-08-16 DIAGNOSIS — M159 Polyosteoarthritis, unspecified: Secondary | ICD-10-CM | POA: Diagnosis not present

## 2019-08-16 DIAGNOSIS — J449 Chronic obstructive pulmonary disease, unspecified: Secondary | ICD-10-CM | POA: Diagnosis not present

## 2019-08-16 DIAGNOSIS — M109 Gout, unspecified: Secondary | ICD-10-CM | POA: Diagnosis not present

## 2019-08-16 DIAGNOSIS — E1151 Type 2 diabetes mellitus with diabetic peripheral angiopathy without gangrene: Secondary | ICD-10-CM | POA: Diagnosis not present

## 2019-08-16 DIAGNOSIS — E1140 Type 2 diabetes mellitus with diabetic neuropathy, unspecified: Secondary | ICD-10-CM | POA: Diagnosis not present

## 2019-08-16 NOTE — Progress Notes (Signed)
letter

## 2019-08-17 ENCOUNTER — Telehealth: Payer: Self-pay | Admitting: Medical Oncology

## 2019-08-17 DIAGNOSIS — Z9181 History of falling: Secondary | ICD-10-CM | POA: Diagnosis not present

## 2019-08-17 DIAGNOSIS — Z1211 Encounter for screening for malignant neoplasm of colon: Secondary | ICD-10-CM | POA: Diagnosis not present

## 2019-08-17 DIAGNOSIS — Z6830 Body mass index (BMI) 30.0-30.9, adult: Secondary | ICD-10-CM | POA: Diagnosis not present

## 2019-08-17 DIAGNOSIS — Z0000 Encounter for general adult medical examination without abnormal findings: Secondary | ICD-10-CM | POA: Diagnosis not present

## 2019-08-17 DIAGNOSIS — E785 Hyperlipidemia, unspecified: Secondary | ICD-10-CM | POA: Diagnosis not present

## 2019-08-17 DIAGNOSIS — Z1331 Encounter for screening for depression: Secondary | ICD-10-CM | POA: Diagnosis not present

## 2019-08-17 NOTE — Telephone Encounter (Signed)
Confirmed with [place] the dates for his appt for lab ,ct and f/u with [facility].

## 2019-08-18 DIAGNOSIS — J449 Chronic obstructive pulmonary disease, unspecified: Secondary | ICD-10-CM | POA: Diagnosis not present

## 2019-08-18 DIAGNOSIS — M109 Gout, unspecified: Secondary | ICD-10-CM | POA: Diagnosis not present

## 2019-08-18 DIAGNOSIS — E1151 Type 2 diabetes mellitus with diabetic peripheral angiopathy without gangrene: Secondary | ICD-10-CM | POA: Diagnosis not present

## 2019-08-18 DIAGNOSIS — E1140 Type 2 diabetes mellitus with diabetic neuropathy, unspecified: Secondary | ICD-10-CM | POA: Diagnosis not present

## 2019-08-18 DIAGNOSIS — I5020 Unspecified systolic (congestive) heart failure: Secondary | ICD-10-CM | POA: Diagnosis not present

## 2019-08-18 DIAGNOSIS — M159 Polyosteoarthritis, unspecified: Secondary | ICD-10-CM | POA: Diagnosis not present

## 2019-08-18 DIAGNOSIS — N1831 Chronic kidney disease, stage 3a: Secondary | ICD-10-CM | POA: Diagnosis not present

## 2019-08-18 DIAGNOSIS — I2510 Atherosclerotic heart disease of native coronary artery without angina pectoris: Secondary | ICD-10-CM | POA: Diagnosis not present

## 2019-08-18 DIAGNOSIS — I130 Hypertensive heart and chronic kidney disease with heart failure and stage 1 through stage 4 chronic kidney disease, or unspecified chronic kidney disease: Secondary | ICD-10-CM | POA: Diagnosis not present

## 2019-08-20 DIAGNOSIS — E1151 Type 2 diabetes mellitus with diabetic peripheral angiopathy without gangrene: Secondary | ICD-10-CM | POA: Diagnosis not present

## 2019-08-20 DIAGNOSIS — I2510 Atherosclerotic heart disease of native coronary artery without angina pectoris: Secondary | ICD-10-CM | POA: Diagnosis not present

## 2019-08-20 DIAGNOSIS — J449 Chronic obstructive pulmonary disease, unspecified: Secondary | ICD-10-CM | POA: Diagnosis not present

## 2019-08-20 DIAGNOSIS — M159 Polyosteoarthritis, unspecified: Secondary | ICD-10-CM | POA: Diagnosis not present

## 2019-08-20 DIAGNOSIS — M109 Gout, unspecified: Secondary | ICD-10-CM | POA: Diagnosis not present

## 2019-08-20 DIAGNOSIS — E1140 Type 2 diabetes mellitus with diabetic neuropathy, unspecified: Secondary | ICD-10-CM | POA: Diagnosis not present

## 2019-08-20 DIAGNOSIS — I130 Hypertensive heart and chronic kidney disease with heart failure and stage 1 through stage 4 chronic kidney disease, or unspecified chronic kidney disease: Secondary | ICD-10-CM | POA: Diagnosis not present

## 2019-08-20 DIAGNOSIS — N1831 Chronic kidney disease, stage 3a: Secondary | ICD-10-CM | POA: Diagnosis not present

## 2019-08-20 DIAGNOSIS — I5020 Unspecified systolic (congestive) heart failure: Secondary | ICD-10-CM | POA: Diagnosis not present

## 2019-08-21 ENCOUNTER — Other Ambulatory Visit (HOSPITAL_COMMUNITY): Payer: Medicare HMO

## 2019-08-22 ENCOUNTER — Other Ambulatory Visit (HOSPITAL_COMMUNITY): Payer: Self-pay | Admitting: Cardiology

## 2019-08-22 ENCOUNTER — Other Ambulatory Visit: Payer: Self-pay | Admitting: Internal Medicine

## 2019-08-22 ENCOUNTER — Telehealth: Payer: Self-pay | Admitting: Medical Oncology

## 2019-08-22 DIAGNOSIS — J449 Chronic obstructive pulmonary disease, unspecified: Secondary | ICD-10-CM | POA: Diagnosis not present

## 2019-08-22 DIAGNOSIS — E1140 Type 2 diabetes mellitus with diabetic neuropathy, unspecified: Secondary | ICD-10-CM | POA: Diagnosis not present

## 2019-08-22 DIAGNOSIS — N1831 Chronic kidney disease, stage 3a: Secondary | ICD-10-CM | POA: Diagnosis not present

## 2019-08-22 DIAGNOSIS — M109 Gout, unspecified: Secondary | ICD-10-CM | POA: Diagnosis not present

## 2019-08-22 DIAGNOSIS — I130 Hypertensive heart and chronic kidney disease with heart failure and stage 1 through stage 4 chronic kidney disease, or unspecified chronic kidney disease: Secondary | ICD-10-CM | POA: Diagnosis not present

## 2019-08-22 DIAGNOSIS — I2510 Atherosclerotic heart disease of native coronary artery without angina pectoris: Secondary | ICD-10-CM | POA: Diagnosis not present

## 2019-08-22 DIAGNOSIS — E1151 Type 2 diabetes mellitus with diabetic peripheral angiopathy without gangrene: Secondary | ICD-10-CM | POA: Diagnosis not present

## 2019-08-22 DIAGNOSIS — M159 Polyosteoarthritis, unspecified: Secondary | ICD-10-CM | POA: Diagnosis not present

## 2019-08-22 DIAGNOSIS — I5020 Unspecified systolic (congestive) heart failure: Secondary | ICD-10-CM | POA: Diagnosis not present

## 2019-08-22 MED ORDER — CHLORPROMAZINE HCL 25 MG PO TABS: 25.0000 mg | ORAL_TABLET | Freq: Three times a day (TID) | ORAL | 0 refills | Status: AC | PRN

## 2019-08-22 NOTE — Progress Notes (Signed)
Electrophysiology Office Note Date: 08/23/2019  ID:  [name] [name]., DOB 10-Oct-1950, MRN [id number]  PCP: [name], PA-C Primary Cardiologist: Loralie Champagne, MD Electrophysiologist: [name], MD   CC: Routine ICD follow-up  [name] [name]. is a 69 y.o. male seen today for Dr. Caryl Comes.  They present today for routine electrophysiology followup.  Since last being seen in our clinic, the patient reports doing very well. They deny chest pain, palpitations, dyspnea, PND, orthopnea, nausea, vomiting, dizziness, syncope, edema, weight gain, or early satiety.  He has not had ICD shocks.   Lexiscan Cardiolite in 12/19 showed inferior infarct, no ischemia. Echo in 12/19 showed EF 35%, mild LVH, mildly decreased RV systolic function. ABIs in 12/19 were stable compared to the past.    He was admitted to [facility] 1/9 for fatigue and feeling poorly.  Records not available. Received ICD shock on 1/9 after multiple "failed" ATP and x1 on 1/11 after failed ATP. Reviewed with Dr. Caryl Comes and all appear to be inappropriate due to SVT with sudden onset.   He is feeling good overall. Denies any further shocks. He is mildly SOB with moderate exertion at baseline. He denies symptoms of palpitations, chest pain,  orthopnea, PND, lower extremity edema, claudication, dizziness, presyncope, syncope, bleeding, or neurologic sequela. The patient is tolerating medications without difficulties.   Device History: St. Jude Single Chamber ICD implanted 06/2014 for primary prevention History of appropriate therapy: No History of AAD therapy: Yes On amiodarone   Past Medical History:  Diagnosis Date  . AICD (automatic cardioverter/defibrillator) present    reports no shocks  - st Jude  . Ambulates with cane   . CAD (coronary artery disease)    a. 04/2013 Cath: LM 10, LAD 40p, 2m, D1 50p, LCX 60p, 55m/100m, OM1 50, OM2 100 L-L collats, RCA 100p;  b. CABG x 5: LIMA->LAD, VG->D2, VG->RI->OM2, VG->PDA.  .  Cataracts, bilateral   . Chronic systolic CHF (congestive heart failure) ([place])    a. 04/2013 Echo: EF 15-20%, diff HK, sev HK of inf and ant myocardium, dilated LA,  b. EF 20-25% with restrictive filling pattern, mod RV dysfx, mild MR (10/10/2013);  c.  Echo 12/15: EF 25-30%  . COPD (chronic obstructive pulmonary disease) ([place])   . Diabetes mellitus without complication ([place])    unsure what type but was dx as an adult, takes no meds, check his cbg at home 2 times a week   . Emphysema   . GERD (gastroesophageal reflux disease)   . Gout   . Hyperlipidemia   . Hypertension   . Ischemic cardiomyopathy   . lung ca   . PVD (peripheral vascular disease) ([place])    a. (10/2013) ABIs: RIGHT 0.63, Waveforms: monophasic;  LEFT 0.78, Waveforms: monophasic  . Tobacco abuse    30+ pack-year history  . Transaminitis    a. 04/2013 Abd U/S and CT unremarkable, hepatitis panel neg -->felt to be 2/2 acute R heart failure.   Past Surgical History:  Procedure Laterality Date  . APPENDECTOMY  as a child  . CARDIAC CATHETERIZATION  04/08/2015   Procedure: Left Heart Cath and Cors/Grafts Angiography;  Surgeon: Peter M Martinique, MD;  Location: [facility];  Service: Cardiovascular;;  . COLONOSCOPY    . CORONARY ARTERY BYPASS GRAFT N/A 05/05/2013   Procedure: CORONARY ARTERY BYPASS GRAFTING (CABG) TIMES FIVE USING LEFT INTERNAL MAMMARY ARTERY AND RIGHT AND LEFT SAPHENOUS LEG VEIN HARVESTED ENDOSCOPICALLY;  Surgeon: Melrose Nakayama, MD;  Location:  [facility];  Service: Open Heart Surgery;  Laterality: N/A;  . IMPLANTABLE CARDIOVERTER DEFIBRILLATOR IMPLANT N/A 07/06/2014   Procedure: St Jude IMPLANTABLE CARDIOVERTER DEFIBRILLATOR IMPLANT;  Surgeon: Deboraha Sprang, MD;  Location[facility];  Service: Cardiovascular;  Laterality: N/A;  . LEFT AND RIGHT HEART CATHETERIZATION WITH CORONARY ANGIOGRAM N/A 04/28/2013   Procedure: LEFT AND RIGHT HEART CATHETERIZATION WITH CORONARY ANGIOGRAM;  Surgeon: Burnell Blanks, MD;  Location[facility] C[facility];  Service: Cardiovascular;  Laterality: N/A;  . MULTIPLE EXTRACTIONS WITH ALVEOLOPLASTY N/A 05/03/2013   Procedure: Extraction of tooth #s 3,4,5,6,8,9,27 with alveoloplast and maxillary left osseous tuberosity reduction;  Surgeon: Lenn Cal, DDS;  Location: [place];  Service: Oral Surgery;  Laterality: N/A;  . TOTAL HIP ARTHROPLASTY Right 11/30/2018   Procedure: TOTAL HIP ARTHROPLASTY ANTERIOR APPROACH;  Surgeon: Rod Can, MD;  Location: [facility];  Service: Orthopedics;  Laterality: Right;  Marland Kitchen VIDEO BRONCHOSCOPY WITH ENDOBRONCHIAL ULTRASOUND N/A 04/20/2019   Procedure: VIDEO BRONCHOSCOPY WITH ENDOBRONCHIAL ULTRASOUND;  Surgeon: Margaretha Seeds, MD;  Location: [facility];  Service: Thoracic;  Laterality: N/A;    Current Outpatient Medications  Medication Sig Dispense Refill  . albuterol (VENTOLIN HFA) 108 (90 Base) MCG/ACT inhaler Inhale 1-2 puffs into the lungs every 6 (six) hours as needed for wheezing or shortness of breath. 6.7 g 6  . allopurinol (ZYLOPRIM) 300 MG tablet Take 300 mg by mouth daily.    Marland Kitchen amiodarone (PACERONE) 400 MG tablet Take 0.5 tablets (200 mg total) by mouth daily. 30 tablet 5  . atorvastatin (LIPITOR) 20 MG tablet Take 20 mg by mouth daily.    Marland Kitchen azelastine (OPTIVAR) 0.05 % ophthalmic solution Place 1 drop into the left eye 2 (two) times daily.   0  . b complex vitamins tablet Take 1 tablet by mouth daily.    . chlorproMAZINE (THORAZINE) 25 MG tablet Take 1 tablet (25 mg total) by mouth 3 (three) times daily as needed. 30 tablet 0  . Cholecalciferol (VITAMIN D3) 25 MCG (1000 UT) CAPS Take by mouth.    Arne Cleveland 5 MG TABS tablet Take 5 mg by mouth 2 (two) times daily.     . furosemide (LASIX) 40 MG tablet Take 40 mg by mouth 2 (two) times daily.     Marland Kitchen guaiFENesin-dextromethorphan (ROBITUSSIN DM) 100-10 MG/5ML syrup Take 5 mLs by mouth every 4 (four) hours as needed for cough.    . hydrALAZINE (APRESOLINE) 25 MG tablet TAKE 0.5  TABLETS (12.5 MG TOTAL) BY MOUTH 3 (THREE) TIMES DAILY. 45 tablet 3  . omeprazole (PRILOSEC) 40 MG capsule Take 40 mg by mouth daily.    Marland Kitchen oxyCODONE-acetaminophen (PERCOCET/ROXICET) 5-325 MG tablet Take by mouth every 4 (four) hours as needed for severe pain.    . potassium chloride SA (KLOR-CON) 20 MEQ tablet Take 1 tablet (20 mEq total) by mouth daily. 2 tablet 0  . promethazine (PHENERGAN) 25 MG tablet Take 25 mg by mouth every 6 (six) hours as needed for nausea or vomiting.    . senna (SENOKOT) 8.6 MG TABS tablet Take 1 tablet (8.6 mg total) by mouth 2 (two) times daily. 120 each 0  . spironolactone (ALDACTONE) 25 MG tablet Take 1 tablet (25 mg total) by mouth daily. 90 tablet 1  . sucralfate (CARAFATE) 1 g tablet Take 1 tablet (1 g total) by mouth 4 (four) times daily -  with meals and at bedtime. 5 min before meals for radiation induced esophagitis 120 tablet 2  .  Thiamine HCl (VITAMIN B-1) 250 MG tablet Take 250 mg by mouth daily.    . metoprolol succinate (TOPROL-XL) 50 MG 24 hr tablet     . predniSONE (DELTASONE) 20 MG tablet 4 tablet p.o. daily for 1 week then 3 tablet p.o. daily for 1 week then 2 tablet p.o. daily for 1 week then 1 tablet p.o. daily for 1 week until I see the patient. 70 tablet 0  . tiZANidine (ZANAFLEX) 4 MG capsule Take 4 mg by mouth 3 (three) times daily.     No current facility-administered medications for this visit.    Allergies:   Penicillins   Social History: Social History   Socioeconomic History  . Marital status: Single    Spouse name: Not on file  . Number of children: Not on file  . Years of education: Not on file  . Highest education level: Not on file  Occupational History  . Occupation: Retired  Tobacco Use  . Smoking status: Former Smoker    Packs/day: 2.00    Years: 30.00    Pack years: 60.00    Types: Cigarettes    Start date: 1969    Quit date: 03/23/2019    Years since quitting: 0.4  . Smokeless tobacco: Never Used  . Tobacco  comment: Pt states now smokes - half pack per day 04/05/19  Substance and Sexual Activity  . Alcohol use: Not Currently  . Drug use: Yes    Types: Marijuana    Comment: last use 04/17/19  . Sexual activity: Not on file  Other Topics Concern  . Not on file  Social History Narrative   [place] and her daughter.     Social Determinants of Health   Financial Resource Strain:   . Difficulty of Paying Living Expenses: Not on file  Food Insecurity:   . Worried About Charity fundraiser in the Last Year: Not on file  . Ran Out of Food in the Last Year: Not on file  Transportation Needs:   . Lack of Transportation (Medical): Not on file  . Lack of Transportation (Non-Medical): Not on file  Physical Activity:   . Days of Exercise per Week: Not on file  . Minutes of Exercise per Session: Not on file  Stress:   . Feeling of Stress : Not on file  Social Connections:   . Frequency of Communication with Friends and Family: Not on file  . Frequency of Social Gatherings with Friends and Family: Not on file  . Attends Religious Services: Not on file  . Active Member of Clubs or Organizations: Not on file  . Attends Archivist Meetings: Not on file  . Marital Status: Not on file  Intimate Partner Violence:   . Fear of Current or Ex-Partner: Not on file  . Emotionally Abused: Not on file  . Physically Abused: Not on file  . Sexually Abused: Not on file    Family History: Family History  Problem Relation Age of Onset  . Diabetes Mother   . Hypertension Father   . Hypertension Other   . Heart attack Other        Brothers  . Diabetes Brother   . Heart attack Brother   . Heart attack Brother   . Stroke Neg Hx     Review of Systems: All other systems reviewed and are otherwise negative except as noted above.   Physical Exam: Vitals:   08/23/19 1001  BP: 112/80  Pulse: 68  Weight: 198 lb (89.8 kg)  Height: 5\' 7"  (1.702 m)     GEN- The  patient is well appearing, alert and oriented x 3 today.   HEENT: normocephalic, atraumatic; sclera clear, conjunctiva pink; hearing intact; oropharynx clear; neck supple, no JVP Lymph- no cervical lymphadenopathy Lungs- Clear to ausculation bilaterally, normal work of breathing.  No wheezes, rales, rhonchi Heart- Regular rate and rhythm, no murmurs, rubs or gallops, PMI not laterally displaced GI- soft, non-tender, non-distended, bowel sounds present, no hepatosplenomegaly Extremities- no clubbing, cyanosis, or edema; DP/PT/radial pulses 2+ bilaterally MS- no significant deformity or atrophy Skin- warm and dry, no rash or lesion; ICD pocket well healed Psych- euthymic mood, full affect Neuro- strength and sensation are intact  ICD interrogation- reviewed in detail today,  See PACEART report  EKG:  EKG is not ordered today. The ekg ordered 07/26/19 shows NSR at 73 bpm with PVCs  Recent Labs: 07/26/2019: Magnesium 1.2; TSH 0.761 08/07/2019: ALT 42; BUN 20; Creatinine 1.33; Hemoglobin 7.6; Platelet Count 168; Potassium 3.3; Sodium 141   Wt Readings from Last 3 Encounters:  08/23/19 198 lb (89.8 kg)  08/14/19 193 lb 11.2 oz (87.9 kg)  08/09/19 197 lb 14.4 oz (89.8 kg)     Other studies Reviewed: Additional studies/ records that were reviewed today include: Most recent labwork, EP office notes, HF office notes.   Assessment and Plan:  1.  Chronic systolic dysfunction s/p St. Jude single chamber ICD  Echo 08/04/2019 EF remains low at 30-35% with moderate-severe MR. Plan for TEE next week. euvolemic today Stable on an appropriate medical regimen Normal ICD function See Pace Art report No changes today  2. Presumed AF with RVR Continue amiodarone 200 mg daily BMET today with recent Hypokalemia Continue eliquis for CHA2DS2VASC of at least 5    3. CKD III-IV Labs today  4. HTN No change today  5. Lung Cancer, NSCLC Radiation is completed Remains on chemotherapy  Current  medicines are reviewed at length with the patient today.   The patient does not have concerns regarding his medicines.  The following changes were made today:  none  Labs/ tests ordered today include:  Orders Placed This Encounter  Procedures  . Basic Metabolic Panel (BMET)  . CUP PACEART INCLINIC DEVICE CHECK    Disposition:   Follow up with Dr. Caryl Comes in 3 months.    Jacalyn Lefevre, PA-C  08/23/2019 10:16 AM  [facility] [address] [place] [address] [phone] (office) [phone] (fax)

## 2019-08-22 NOTE — Telephone Encounter (Signed)
I will start him on Thorazine 25 mg p.o. every 8 hours as needed.  Thank you.

## 2019-08-22 NOTE — Telephone Encounter (Signed)
Delphine notified.

## 2019-08-22 NOTE — Telephone Encounter (Signed)
Hiccoughs x 2 days. Taking medrol dose pack - Started 2/22. Mohamed to advise.

## 2019-08-22 NOTE — H&P (View-Only) (Signed)
Electrophysiology Office Note Date: 08/23/2019  ID:  [name] Cleverly., DOB 10-06-1950, MRN [id number]  PCP: [name], PA-C Primary Cardiologist: Loralie Champagne, MD Electrophysiologist: [name], MD   CC: Routine ICD follow-up  [name] Baugh. is a 69 y.o. male seen today for Dr. Caryl Comes.  They present today for routine electrophysiology followup.  Since last being seen in our clinic, the patient reports doing very well. They deny chest pain, palpitations, dyspnea, PND, orthopnea, nausea, vomiting, dizziness, syncope, edema, weight gain, or early satiety.  He has not had ICD shocks.   Lexiscan Cardiolite in 12/19 showed inferior infarct, no ischemia. Echo in 12/19 showed EF 35%, mild LVH, mildly decreased RV systolic function. ABIs in 12/19 were stable compared to the past.    He was admitted to [facility] 1/9 for fatigue and feeling poorly.  Records not available. Received ICD shock on 1/9 after multiple "failed" ATP and x1 on 1/11 after failed ATP. Reviewed with Dr. Caryl Comes and all appear to be inappropriate due to SVT with sudden onset.   He is feeling good overall. Denies any further shocks. He is mildly SOB with moderate exertion at baseline. He denies symptoms of palpitations, chest pain,  orthopnea, PND, lower extremity edema, claudication, dizziness, presyncope, syncope, bleeding, or neurologic sequela. The patient is tolerating medications without difficulties.   Device History: St. Jude Single Chamber ICD implanted 06/2014 for primary prevention History of appropriate therapy: No History of AAD therapy: Yes On amiodarone   Past Medical History:  Diagnosis Date  . AICD (automatic cardioverter/defibrillator) present    reports no shocks  - st Jude  . Ambulates with cane   . CAD (coronary artery disease)    a. 04/2013 Cath: LM 10, LAD 40p, 10m, D1 50p, LCX 60p, 88m/100m, OM1 50, OM2 100 L-L collats, RCA 100p;  b. CABG x 5: LIMA->LAD, VG->D2, VG->RI->OM2, VG->PDA.  .  Cataracts, bilateral   . Chronic systolic CHF (congestive heart failure) ([place])    a. 04/2013 Echo: EF 15-20%, diff HK, sev HK of inf and ant myocardium, dilated LA,  b. EF 20-25% with restrictive filling pattern, mod RV dysfx, mild MR (10/10/2013);  c.  Echo 12/15: EF 25-30%  . COPD (chronic obstructive pulmonary disease) ([place])   . Diabetes mellitus without complication ([place])    unsure what type but was dx as an adult, takes no meds, check his cbg at home 2 times a week   . Emphysema   . GERD (gastroesophageal reflux disease)   . Gout   . Hyperlipidemia   . Hypertension   . Ischemic cardiomyopathy   . lung ca   . PVD (peripheral vascular disease) ([place])    a. (10/2013) ABIs: RIGHT 0.63, Waveforms: monophasic;  LEFT 0.78, Waveforms: monophasic  . Tobacco abuse    30+ pack-year history  . Transaminitis    a. 04/2013 Abd U/S and CT unremarkable, hepatitis panel neg -->felt to be 2/2 acute R heart failure.   Past Surgical History:  Procedure Laterality Date  . APPENDECTOMY  as a child  . CARDIAC CATHETERIZATION  04/08/2015   Procedure: Left Heart Cath and Cors/Grafts Angiography;  Surgeon: Peter M Martinique, MD;  Location: [facility];  Service: Cardiovascular;;  . COLONOSCOPY    . CORONARY ARTERY BYPASS GRAFT N/A 05/05/2013   Procedure: CORONARY ARTERY BYPASS GRAFTING (CABG) TIMES FIVE USING LEFT INTERNAL MAMMARY ARTERY AND RIGHT AND LEFT SAPHENOUS LEG VEIN HARVESTED ENDOSCOPICALLY;  Surgeon: Melrose Nakayama, MD;  Location:  [facility];  Service: Open Heart Surgery;  Laterality: N/A;  . IMPLANTABLE CARDIOVERTER DEFIBRILLATOR IMPLANT N/A 07/06/2014   Procedure: St Jude IMPLANTABLE CARDIOVERTER DEFIBRILLATOR IMPLANT;  Surgeon: Deboraha Sprang, MD;  Location: [facility] CATH LAB;  Service: Cardiovascular;  Laterality: N/A;  . LEFT AND RIGHT HEART CATHETERIZATION WITH CORONARY ANGIOGRAM N/A 04/28/2013   Procedure: LEFT AND RIGHT HEART CATHETERIZATION WITH CORONARY ANGIOGRAM;  Surgeon: Burnell Blanks, MD;  Location: [facility] C[facility];  Service: Cardiovascular;  Laterality: N/A;  . MULTIPLE EXTRACTIONS WITH ALVEOLOPLASTY N/A 05/03/2013   Procedure: Extraction of tooth #s 3,4,5,6,8,9,27 with alveoloplast and maxillary left osseous tuberosity reduction;  Surgeon: Lenn Cal, DDS;  Location: [facility];  Service: Oral Surgery;  Laterality: N/A;  . TOTAL HIP ARTHROPLASTY Right 11/30/2018   Procedure: TOTAL HIP ARTHROPLASTY ANTERIOR APPROACH;  Surgeon: Rod Can, MD;  Location: [facility];  Service: Orthopedics;  Laterality: Right;  Marland Kitchen VIDEO BRONCHOSCOPY WITH ENDOBRONCHIAL ULTRASOUND N/A 04/20/2019   Procedure: VIDEO BRONCHOSCOPY WITH ENDOBRONCHIAL ULTRASOUND;  Surgeon: Margaretha Seeds, MD;  Location: [facility];  Service: Thoracic;  Laterality: N/A;    Current Outpatient Medications  Medication Sig Dispense Refill  . albuterol (VENTOLIN HFA) 108 (90 Base) MCG/ACT inhaler Inhale 1-2 puffs into the lungs every 6 (six) hours as needed for wheezing or shortness of breath. 6.7 g 6  . allopurinol (ZYLOPRIM) 300 MG tablet Take 300 mg by mouth daily.    Marland Kitchen amiodarone (PACERONE) 400 MG tablet Take 0.5 tablets (200 mg total) by mouth daily. 30 tablet 5  . atorvastatin (LIPITOR) 20 MG tablet Take 20 mg by mouth daily.    Marland Kitchen azelastine (OPTIVAR) 0.05 % ophthalmic solution Place 1 drop into the left eye 2 (two) times daily.   0  . b complex vitamins tablet Take 1 tablet by mouth daily.    . chlorproMAZINE (THORAZINE) 25 MG tablet Take 1 tablet (25 mg total) by mouth 3 (three) times daily as needed. 30 tablet 0  . Cholecalciferol (VITAMIN D3) 25 MCG (1000 UT) CAPS Take by mouth.    Arne Cleveland 5 MG TABS tablet Take 5 mg by mouth 2 (two) times daily.     . furosemide (LASIX) 40 MG tablet Take 40 mg by mouth 2 (two) times daily.     Marland Kitchen guaiFENesin-dextromethorphan (ROBITUSSIN DM) 100-10 MG/5ML syrup Take 5 mLs by mouth every 4 (four) hours as needed for cough.    . hydrALAZINE (APRESOLINE) 25 MG tablet TAKE 0.5  TABLETS (12.5 MG TOTAL) BY MOUTH 3 (THREE) TIMES DAILY. 45 tablet 3  . omeprazole (PRILOSEC) 40 MG capsule Take 40 mg by mouth daily.    Marland Kitchen oxyCODONE-acetaminophen (PERCOCET/ROXICET) 5-325 MG tablet Take by mouth every 4 (four) hours as needed for severe pain.    . potassium chloride SA (KLOR-CON) 20 MEQ tablet Take 1 tablet (20 mEq total) by mouth daily. 2 tablet 0  . promethazine (PHENERGAN) 25 MG tablet Take 25 mg by mouth every 6 (six) hours as needed for nausea or vomiting.    . senna (SENOKOT) 8.6 MG TABS tablet Take 1 tablet (8.6 mg total) by mouth 2 (two) times daily. 120 each 0  . spironolactone (ALDACTONE) 25 MG tablet Take 1 tablet (25 mg total) by mouth daily. 90 tablet 1  . sucralfate (CARAFATE) 1 g tablet Take 1 tablet (1 g total) by mouth 4 (four) times daily -  with meals and at bedtime. 5 min before meals for radiation induced esophagitis 120 tablet 2  .  Thiamine HCl (VITAMIN B-1) 250 MG tablet Take 250 mg by mouth daily.    . metoprolol succinate (TOPROL-XL) 50 MG 24 hr tablet     . predniSONE (DELTASONE) 20 MG tablet 4 tablet p.o. daily for 1 week then 3 tablet p.o. daily for 1 week then 2 tablet p.o. daily for 1 week then 1 tablet p.o. daily for 1 week until I see the patient. 70 tablet 0  . tiZANidine (ZANAFLEX) 4 MG capsule Take 4 mg by mouth 3 (three) times daily.     No current facility-administered medications for this visit.    Allergies:   Penicillins   Social History: Social History   Socioeconomic History  . Marital status: Single    Spouse name: Not on file  . Number of children: Not on file  . Years of education: Not on file  . Highest education level: Not on file  Occupational History  . Occupation: Retired  Tobacco Use  . Smoking status: Former Smoker    Packs/day: 2.00    Years: 30.00    Pack years: 60.00    Types: Cigarettes    Start date: 1969    Quit date: 03/23/2019    Years since quitting: 0.4  . Smokeless tobacco: Never Used  . Tobacco  comment: Pt states now smokes - half pack per day 04/05/19  Substance and Sexual Activity  . Alcohol use: Not Currently  . Drug use: Yes    Types: Marijuana    Comment: last use 04/17/19  . Sexual activity: Not on file  Other Topics Concern  . Not on file  Social History Narrative   Lives in [place] with his girlfriend and her daughter.     Social Determinants of Health   Financial Resource Strain:   . Difficulty of Paying Living Expenses: Not on file  Food Insecurity:   . Worried About Charity fundraiser in the Last Year: Not on file  . Ran Out of Food in the Last Year: Not on file  Transportation Needs:   . Lack of Transportation (Medical): Not on file  . Lack of Transportation (Non-Medical): Not on file  Physical Activity:   . Days of Exercise per Week: Not on file  . Minutes of Exercise per Session: Not on file  Stress:   . Feeling of Stress : Not on file  Social Connections:   . Frequency of Communication with Friends and Family: Not on file  . Frequency of Social Gatherings with Friends and Family: Not on file  . Attends Religious Services: Not on file  . Active Member of Clubs or Organizations: Not on file  . Attends Archivist Meetings: Not on file  . Marital Status: Not on file  Intimate Partner Violence:   . Fear of Current or Ex-Partner: Not on file  . Emotionally Abused: Not on file  . Physically Abused: Not on file  . Sexually Abused: Not on file    Family History: Family History  Problem Relation Age of Onset  . Diabetes Mother   . Hypertension Father   . Hypertension Other   . Heart attack Other        Brothers  . Diabetes Brother   . Heart attack Brother   . Heart attack Brother   . Stroke Neg Hx     Review of Systems: All other systems reviewed and are otherwise negative except as noted above.   Physical Exam: Vitals:   08/23/19 1001  BP: 112/80  Pulse: 68  Weight: 198 lb (89.8 kg)  Height: 5\' 7"  (1.702 m)     GEN- The  patient is well appearing, alert and oriented x 3 today.   HEENT: normocephalic, atraumatic; sclera clear, conjunctiva pink; hearing intact; oropharynx clear; neck supple, no JVP Lymph- no cervical lymphadenopathy Lungs- Clear to ausculation bilaterally, normal work of breathing.  No wheezes, rales, rhonchi Heart- Regular rate and rhythm, no murmurs, rubs or gallops, PMI not laterally displaced GI- soft, non-tender, non-distended, bowel sounds present, no hepatosplenomegaly Extremities- no clubbing, cyanosis, or edema; DP/PT/radial pulses 2+ bilaterally MS- no significant deformity or atrophy Skin- warm and dry, no rash or lesion; ICD pocket well healed Psych- euthymic mood, full affect Neuro- strength and sensation are intact  ICD interrogation- reviewed in detail today,  See PACEART report  EKG:  EKG is not ordered today. The ekg ordered 07/26/19 shows NSR at 73 bpm with PVCs  Recent Labs: 07/26/2019: Magnesium 1.2; TSH 0.761 08/07/2019: ALT 42; BUN 20; Creatinine 1.33; Hemoglobin 7.6; Platelet Count 168; Potassium 3.3; Sodium 141   Wt Readings from Last 3 Encounters:  08/23/19 198 lb (89.8 kg)  08/14/19 193 lb 11.2 oz (87.9 kg)  08/09/19 197 lb 14.4 oz (89.8 kg)     Other studies Reviewed: Additional studies/ records that were reviewed today include: Most recent labwork, EP office notes, HF office notes.   Assessment and Plan:  1.  Chronic systolic dysfunction s/p St. Jude single chamber ICD  Echo 08/04/2019 EF remains low at 30-35% with moderate-severe MR. Plan for TEE next week. euvolemic today Stable on an appropriate medical regimen Normal ICD function See Pace Art report No changes today  2. Presumed AF with RVR Continue amiodarone 200 mg daily BMET today with recent Hypokalemia Continue eliquis for CHA2DS2VASC of at least 5    3. CKD III-IV Labs today  4. HTN No change today  5. Lung Cancer, NSCLC Radiation is completed Remains on chemotherapy  Current  medicines are reviewed at length with the patient today.   The patient does not have concerns regarding his medicines.  The following changes were made today:  none  Labs/ tests ordered today include:  Orders Placed This Encounter  Procedures  . Basic Metabolic Panel (BMET)  . CUP PACEART INCLINIC DEVICE CHECK    Disposition:   Follow up with Dr. Caryl Comes in 3 months.    Jacalyn Lefevre, PA-C  08/23/2019 10:16 AM  [facility] [address] [place] [address] [phone] (office) [phone] (fax)

## 2019-08-23 ENCOUNTER — Other Ambulatory Visit: Payer: Self-pay

## 2019-08-23 ENCOUNTER — Ambulatory Visit (INDEPENDENT_AMBULATORY_CARE_PROVIDER_SITE_OTHER): Payer: Medicare HMO | Admitting: Student

## 2019-08-23 ENCOUNTER — Encounter: Payer: Self-pay | Admitting: Student

## 2019-08-23 VITALS — BP 112/80 | HR 68 | Ht 67.0 in | Wt 198.0 lb

## 2019-08-23 DIAGNOSIS — I130 Hypertensive heart and chronic kidney disease with heart failure and stage 1 through stage 4 chronic kidney disease, or unspecified chronic kidney disease: Secondary | ICD-10-CM | POA: Diagnosis not present

## 2019-08-23 DIAGNOSIS — M159 Polyosteoarthritis, unspecified: Secondary | ICD-10-CM | POA: Diagnosis not present

## 2019-08-23 DIAGNOSIS — I5022 Chronic systolic (congestive) heart failure: Secondary | ICD-10-CM

## 2019-08-23 DIAGNOSIS — Z79899 Other long term (current) drug therapy: Secondary | ICD-10-CM | POA: Diagnosis not present

## 2019-08-23 DIAGNOSIS — J449 Chronic obstructive pulmonary disease, unspecified: Secondary | ICD-10-CM | POA: Diagnosis not present

## 2019-08-23 DIAGNOSIS — M109 Gout, unspecified: Secondary | ICD-10-CM | POA: Diagnosis not present

## 2019-08-23 DIAGNOSIS — I5020 Unspecified systolic (congestive) heart failure: Secondary | ICD-10-CM | POA: Diagnosis not present

## 2019-08-23 DIAGNOSIS — I2510 Atherosclerotic heart disease of native coronary artery without angina pectoris: Secondary | ICD-10-CM | POA: Diagnosis not present

## 2019-08-23 DIAGNOSIS — I10 Essential (primary) hypertension: Secondary | ICD-10-CM | POA: Diagnosis not present

## 2019-08-23 DIAGNOSIS — E1140 Type 2 diabetes mellitus with diabetic neuropathy, unspecified: Secondary | ICD-10-CM | POA: Diagnosis not present

## 2019-08-23 DIAGNOSIS — I4891 Unspecified atrial fibrillation: Secondary | ICD-10-CM

## 2019-08-23 DIAGNOSIS — E1151 Type 2 diabetes mellitus with diabetic peripheral angiopathy without gangrene: Secondary | ICD-10-CM | POA: Diagnosis not present

## 2019-08-23 DIAGNOSIS — N1831 Chronic kidney disease, stage 3a: Secondary | ICD-10-CM | POA: Diagnosis not present

## 2019-08-23 LAB — CUP PACEART INCLINIC DEVICE CHECK
Battery Remaining Longevity: 57 mo
Brady Statistic RV Percent Paced: 0 %
Date Time Interrogation Session: 20210303101856
HighPow Impedance: 51.75 Ohm
Implantable Lead Implant Date: 20160115
Implantable Lead Location: 753860
Implantable Lead Model: 7122
Implantable Pulse Generator Implant Date: 20160115
Lead Channel Impedance Value: 362.5 Ohm
Lead Channel Pacing Threshold Amplitude: 1.25 V
Lead Channel Pacing Threshold Amplitude: 1.25 V
Lead Channel Pacing Threshold Pulse Width: 0.5 ms
Lead Channel Pacing Threshold Pulse Width: 0.5 ms
Lead Channel Sensing Intrinsic Amplitude: 5.7 mV
Lead Channel Setting Pacing Amplitude: 2.5 V
Lead Channel Setting Pacing Pulse Width: 0.5 ms
Lead Channel Setting Sensing Sensitivity: 0.5 mV
Pulse Gen Serial Number: 7233603

## 2019-08-23 LAB — BASIC METABOLIC PANEL
BUN/Creatinine Ratio: 29 — ABNORMAL HIGH (ref 10–24)
BUN: 52 mg/dL — ABNORMAL HIGH (ref 8–27)
CO2: 22 mmol/L (ref 20–29)
Calcium: 8.8 mg/dL (ref 8.6–10.2)
Chloride: 101 mmol/L (ref 96–106)
Creatinine, Ser: 1.78 mg/dL — ABNORMAL HIGH (ref 0.76–1.27)
GFR calc Af Amer: 44 mL/min/{1.73_m2} — ABNORMAL LOW (ref >59)
GFR calc non Af Amer: 38 mL/min/{1.73_m2} — ABNORMAL LOW (ref >59)
Glucose: 118 mg/dL — ABNORMAL HIGH (ref 65–99)
Potassium: 4.2 mmol/L (ref 3.5–5.2)
Sodium: 140 mmol/L (ref 134–144)

## 2019-08-23 NOTE — Patient Instructions (Addendum)
Medication Instructions:  none *If you need a refill on your cardiac medications before your next appointment, please call your pharmacy*   Lab Work:  TODAY BMET If you have labs (blood work) drawn today and your tests are completely normal, you will receive your results only by: Marland Kitchen MyChart Message (if you have MyChart) OR . A paper copy in the mail If you have any lab test that is abnormal or we need to change your treatment, we will call you to review the results.   Testing/Procedures: none   Follow-Up: At [facility], you and your health needs are our priority.  As part of our continuing mission to provide you with exceptional heart care, we have created designated Provider Care Teams.  These Care Teams include your primary Cardiologist (physician) and Advanced Practice Providers (APPs -  Physician Assistants and Nurse Practitioners) who all work together to provide you with the care you need, when you need it.  We recommend signing up for the patient portal called "MyChart".  Sign up information is provided on this After Visit Summary.  MyChart is used to connect with patients for Virtual Visits (Telemedicine).  Patients are able to view lab/test results, encounter notes, upcoming appointments, etc.  Non-urgent messages can be sent to your provider as well.   To learn more about what you can do with MyChart, go to [email or web address].    Your next appointment:   3 months  The format for your next appointment:   Either In Person or Virtual  Provider:   Dr Caryl Comes   Other Instructions Remote monitoring is used to monitor your  ICD from home. This monitoring reduces the number of office visits required to check your device to one time per year. It allows [place] to keep an eye on the functioning of your device to ensure it is working properly. You are scheduled for a device check from home on 09/26/19. You may send your transmission at any time that day. If you have a wireless device,  the transmission will be sent automatically. After your physician reviews your transmission, you will receive a postcard with your next transmission date.

## 2019-08-24 ENCOUNTER — Other Ambulatory Visit (HOSPITAL_COMMUNITY): Payer: Self-pay | Admitting: *Deleted

## 2019-08-24 DIAGNOSIS — I340 Nonrheumatic mitral (valve) insufficiency: Secondary | ICD-10-CM

## 2019-08-25 DIAGNOSIS — I509 Heart failure, unspecified: Secondary | ICD-10-CM | POA: Diagnosis not present

## 2019-08-25 DIAGNOSIS — C3492 Malignant neoplasm of unspecified part of left bronchus or lung: Secondary | ICD-10-CM | POA: Diagnosis not present

## 2019-08-25 DIAGNOSIS — J189 Pneumonia, unspecified organism: Secondary | ICD-10-CM | POA: Diagnosis not present

## 2019-08-25 DIAGNOSIS — Z6830 Body mass index (BMI) 30.0-30.9, adult: Secondary | ICD-10-CM | POA: Diagnosis not present

## 2019-08-25 DIAGNOSIS — D649 Anemia, unspecified: Secondary | ICD-10-CM | POA: Diagnosis not present

## 2019-08-28 DIAGNOSIS — M109 Gout, unspecified: Secondary | ICD-10-CM | POA: Diagnosis not present

## 2019-08-28 DIAGNOSIS — E1151 Type 2 diabetes mellitus with diabetic peripheral angiopathy without gangrene: Secondary | ICD-10-CM | POA: Diagnosis not present

## 2019-08-28 DIAGNOSIS — J449 Chronic obstructive pulmonary disease, unspecified: Secondary | ICD-10-CM | POA: Diagnosis not present

## 2019-08-28 DIAGNOSIS — I5020 Unspecified systolic (congestive) heart failure: Secondary | ICD-10-CM | POA: Diagnosis not present

## 2019-08-28 DIAGNOSIS — N1831 Chronic kidney disease, stage 3a: Secondary | ICD-10-CM | POA: Diagnosis not present

## 2019-08-28 DIAGNOSIS — I2510 Atherosclerotic heart disease of native coronary artery without angina pectoris: Secondary | ICD-10-CM | POA: Diagnosis not present

## 2019-08-28 DIAGNOSIS — M159 Polyosteoarthritis, unspecified: Secondary | ICD-10-CM | POA: Diagnosis not present

## 2019-08-28 DIAGNOSIS — I130 Hypertensive heart and chronic kidney disease with heart failure and stage 1 through stage 4 chronic kidney disease, or unspecified chronic kidney disease: Secondary | ICD-10-CM | POA: Diagnosis not present

## 2019-08-28 DIAGNOSIS — E1140 Type 2 diabetes mellitus with diabetic neuropathy, unspecified: Secondary | ICD-10-CM | POA: Diagnosis not present

## 2019-08-29 ENCOUNTER — Other Ambulatory Visit (HOSPITAL_COMMUNITY)
Admission: RE | Admit: 2019-08-29 | Discharge: 2019-08-29 | Disposition: A | Discharge status: Home / Self Care | Payer: Medicare HMO | Source: Ambulatory Visit | Attending: Cardiology | Admitting: Cardiology

## 2019-08-29 DIAGNOSIS — Z01812 Encounter for preprocedural laboratory examination: Secondary | ICD-10-CM | POA: Insufficient documentation

## 2019-08-29 DIAGNOSIS — Z20822 Contact with and (suspected) exposure to covid-19: Secondary | ICD-10-CM | POA: Diagnosis not present

## 2019-08-29 LAB — SARS CORONAVIRUS 2 (TAT 6-24 HRS): SARS Coronavirus 2: NEGATIVE

## 2019-08-30 DIAGNOSIS — I5020 Unspecified systolic (congestive) heart failure: Secondary | ICD-10-CM | POA: Diagnosis not present

## 2019-08-30 DIAGNOSIS — E1140 Type 2 diabetes mellitus with diabetic neuropathy, unspecified: Secondary | ICD-10-CM | POA: Diagnosis not present

## 2019-08-30 DIAGNOSIS — M159 Polyosteoarthritis, unspecified: Secondary | ICD-10-CM | POA: Diagnosis not present

## 2019-08-30 DIAGNOSIS — M109 Gout, unspecified: Secondary | ICD-10-CM | POA: Diagnosis not present

## 2019-08-30 DIAGNOSIS — I130 Hypertensive heart and chronic kidney disease with heart failure and stage 1 through stage 4 chronic kidney disease, or unspecified chronic kidney disease: Secondary | ICD-10-CM | POA: Diagnosis not present

## 2019-08-30 DIAGNOSIS — J449 Chronic obstructive pulmonary disease, unspecified: Secondary | ICD-10-CM | POA: Diagnosis not present

## 2019-08-30 DIAGNOSIS — I2510 Atherosclerotic heart disease of native coronary artery without angina pectoris: Secondary | ICD-10-CM | POA: Diagnosis not present

## 2019-08-30 DIAGNOSIS — E1151 Type 2 diabetes mellitus with diabetic peripheral angiopathy without gangrene: Secondary | ICD-10-CM | POA: Diagnosis not present

## 2019-08-30 DIAGNOSIS — N1831 Chronic kidney disease, stage 3a: Secondary | ICD-10-CM | POA: Diagnosis not present

## 2019-08-31 ENCOUNTER — Telehealth: Payer: Self-pay

## 2019-08-31 NOTE — Telephone Encounter (Signed)
-----   Message from Shirley Friar, PA-C sent at 08/31/2019  8:14 AM EST ----- Cr elevated from last check but not far from baseline 1.4 - 1.6.  He has TEE with Dr. Aundra Dubin scheduled for tomorrow to further evaluate his MR.   It appears he has labs scheduled for 3/19 with [facility], prior to a chest CT; Can we please make sure a BMET is included in that?

## 2019-08-31 NOTE — Telephone Encounter (Signed)
Thank you :)

## 2019-08-31 NOTE — Telephone Encounter (Signed)
[facility] and found out that the patient will be getting a CMET drawn on that day (3/19).

## 2019-09-01 ENCOUNTER — Encounter (HOSPITAL_COMMUNITY): Payer: Self-pay | Admitting: Cardiology

## 2019-09-01 ENCOUNTER — Other Ambulatory Visit: Payer: Self-pay

## 2019-09-01 ENCOUNTER — Ambulatory Visit (HOSPITAL_COMMUNITY)
Admission: RE | Admit: 2019-09-01 | Discharge: 2019-09-01 | Disposition: A | Discharge status: Home / Self Care | Payer: Medicare HMO | Attending: Cardiology | Admitting: Cardiology

## 2019-09-01 ENCOUNTER — Encounter (HOSPITAL_COMMUNITY)
Admission: RE | Disposition: A | Discharge status: Home / Self Care | Payer: Self-pay | Source: Home / Self Care | Attending: Cardiology

## 2019-09-01 ENCOUNTER — Ambulatory Visit (HOSPITAL_BASED_OUTPATIENT_CLINIC_OR_DEPARTMENT_OTHER): Payer: Medicare HMO

## 2019-09-01 DIAGNOSIS — J449 Chronic obstructive pulmonary disease, unspecified: Secondary | ICD-10-CM | POA: Insufficient documentation

## 2019-09-01 DIAGNOSIS — Z880 Allergy status to penicillin: Secondary | ICD-10-CM | POA: Insufficient documentation

## 2019-09-01 DIAGNOSIS — Z95810 Presence of automatic (implantable) cardiac defibrillator: Secondary | ICD-10-CM | POA: Insufficient documentation

## 2019-09-01 DIAGNOSIS — Z79899 Other long term (current) drug therapy: Secondary | ICD-10-CM | POA: Diagnosis not present

## 2019-09-01 DIAGNOSIS — K219 Gastro-esophageal reflux disease without esophagitis: Secondary | ICD-10-CM | POA: Diagnosis not present

## 2019-09-01 DIAGNOSIS — Z951 Presence of aortocoronary bypass graft: Secondary | ICD-10-CM | POA: Diagnosis not present

## 2019-09-01 DIAGNOSIS — E1136 Type 2 diabetes mellitus with diabetic cataract: Secondary | ICD-10-CM | POA: Insufficient documentation

## 2019-09-01 DIAGNOSIS — Z923 Personal history of irradiation: Secondary | ICD-10-CM | POA: Insufficient documentation

## 2019-09-01 DIAGNOSIS — N184 Chronic kidney disease, stage 4 (severe): Secondary | ICD-10-CM | POA: Insufficient documentation

## 2019-09-01 DIAGNOSIS — I2510 Atherosclerotic heart disease of native coronary artery without angina pectoris: Secondary | ICD-10-CM | POA: Diagnosis not present

## 2019-09-01 DIAGNOSIS — I5022 Chronic systolic (congestive) heart failure: Secondary | ICD-10-CM | POA: Insufficient documentation

## 2019-09-01 DIAGNOSIS — E1122 Type 2 diabetes mellitus with diabetic chronic kidney disease: Secondary | ICD-10-CM | POA: Insufficient documentation

## 2019-09-01 DIAGNOSIS — E785 Hyperlipidemia, unspecified: Secondary | ICD-10-CM | POA: Diagnosis not present

## 2019-09-01 DIAGNOSIS — I255 Ischemic cardiomyopathy: Secondary | ICD-10-CM | POA: Diagnosis not present

## 2019-09-01 DIAGNOSIS — I340 Nonrheumatic mitral (valve) insufficiency: Secondary | ICD-10-CM

## 2019-09-01 DIAGNOSIS — I130 Hypertensive heart and chronic kidney disease with heart failure and stage 1 through stage 4 chronic kidney disease, or unspecified chronic kidney disease: Secondary | ICD-10-CM | POA: Insufficient documentation

## 2019-09-01 DIAGNOSIS — Z7901 Long term (current) use of anticoagulants: Secondary | ICD-10-CM | POA: Diagnosis not present

## 2019-09-01 DIAGNOSIS — M109 Gout, unspecified: Secondary | ICD-10-CM | POA: Insufficient documentation

## 2019-09-01 DIAGNOSIS — C3490 Malignant neoplasm of unspecified part of unspecified bronchus or lung: Secondary | ICD-10-CM | POA: Insufficient documentation

## 2019-09-01 DIAGNOSIS — E1151 Type 2 diabetes mellitus with diabetic peripheral angiopathy without gangrene: Secondary | ICD-10-CM | POA: Insufficient documentation

## 2019-09-01 DIAGNOSIS — Z9221 Personal history of antineoplastic chemotherapy: Secondary | ICD-10-CM | POA: Insufficient documentation

## 2019-09-01 DIAGNOSIS — F17210 Nicotine dependence, cigarettes, uncomplicated: Secondary | ICD-10-CM | POA: Insufficient documentation

## 2019-09-01 HISTORY — PX: TEE WITHOUT CARDIOVERSION: SHX5443

## 2019-09-01 LAB — POCT I-STAT, CHEM 8
BUN: 33 mg/dL — ABNORMAL HIGH (ref 8–23)
Calcium, Ion: 1.18 mmol/L (ref 1.15–1.40)
Chloride: 104 mmol/L (ref 98–111)
Creatinine, Ser: 1.8 mg/dL — ABNORMAL HIGH (ref 0.61–1.24)
Glucose, Bld: 88 mg/dL (ref 70–99)
HCT: 36 % — ABNORMAL LOW (ref 39.0–52.0)
Hemoglobin: 12.2 g/dL — ABNORMAL LOW (ref 13.0–17.0)
Potassium: 3.7 mmol/L (ref 3.5–5.1)
Sodium: 140 mmol/L (ref 135–145)
TCO2: 29 mmol/L (ref 22–32)

## 2019-09-01 SURGERY — ECHOCARDIOGRAM, TRANSESOPHAGEAL
Anesthesia: Moderate Sedation

## 2019-09-01 MED ORDER — FENTANYL CITRATE (PF) 100 MCG/2ML IJ SOLN
INTRAMUSCULAR | Status: AC
  Filled 2019-09-01: qty 4

## 2019-09-01 MED ORDER — SODIUM CHLORIDE 0.9 % IV SOLN
INTRAVENOUS | Status: AC | PRN
  Administered 2019-09-01: 500 mL via INTRAVENOUS

## 2019-09-01 MED ORDER — BUTAMBEN-TETRACAINE-BENZOCAINE 2-2-14 % EX AERO
INHALATION_SPRAY | CUTANEOUS | Status: DC | PRN
  Administered 2019-09-01: 2 via TOPICAL

## 2019-09-01 MED ORDER — DIPHENHYDRAMINE HCL 50 MG/ML IJ SOLN
INTRAMUSCULAR | Status: AC
  Filled 2019-09-01: qty 1

## 2019-09-01 MED ORDER — SODIUM CHLORIDE 0.9 % IV SOLN: INTRAVENOUS | Status: DC

## 2019-09-01 MED ORDER — FENTANYL CITRATE (PF) 100 MCG/2ML IJ SOLN
INTRAMUSCULAR | Status: DC | PRN
  Administered 2019-09-01: 25 ug via INTRAVENOUS

## 2019-09-01 MED ORDER — MIDAZOLAM HCL (PF) 5 MG/ML IJ SOLN
INTRAMUSCULAR | Status: AC
  Filled 2019-09-01: qty 3

## 2019-09-01 MED ORDER — MIDAZOLAM HCL (PF) 10 MG/2ML IJ SOLN
INTRAMUSCULAR | Status: DC | PRN
  Administered 2019-09-01: 1 mg via INTRAVENOUS
  Administered 2019-09-01: 2 mg via INTRAVENOUS

## 2019-09-01 NOTE — Interval H&P Note (Signed)
History and Physical Interval Note:  09/01/2019 10:51 AM  [name] [name].  has presented today for surgery, with the diagnosis of [place].  The various methods of treatment have been discussed with the patient and family. After consideration of risks, benefits and other options for treatment, the patient has consented to  Procedure(s): TRANSESOPHAGEAL ECHOCARDIOGRAM (TEE) (N/A) as a surgical intervention.  The patient's history has been reviewed, patient examined, no change in status, stable for surgery.  I have reviewed the patient's chart and labs.  Questions were answered to the patient's satisfaction.     Aadya Kindler [employer]

## 2019-09-01 NOTE — CV Procedure (Signed)
Procedure: TEE  Sedation: Versed 3 mg IV, Fentanyl 25 mcg IV  Indication: Mitral regurgitation  Findings: Please see echo section for full report.  The left ventricle was mildly dilated with normal wall thickness.  Diffuse hypokinesis with EF 25-30%.  The right ventricle was normal in size with mildly decreased systolic function. There is a pacemaker in the RV.  Moderate left atrial enlargement, no LA appendage thrombus. Mild right atrial enlargement.  No evidence for PFO/ASD by color doppler. Trileaflet aortic valve with no regurgitation or stenosis. Mild to moderate tricuspid regurgitation.  Peak RV-RA gradient 40 mmHg.  The posterior mitral leaflet was restricted with moderate-severe (3+) regurgitation visually.  PISA ERO 0.22 cm^2 with regurgitant volume 40 cc suggests moderate mitral regurgitation.  The pulmonary vein systolic doppler flow was flattened but not reversed. The aorta was normal in caliber with mild plaque.   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   [name] [name] 09/01/2019 11:16 AM

## 2019-09-01 NOTE — Progress Notes (Signed)
  Echocardiogram Echocardiogram Transesophageal has been performed.  [name] [name] 09/01/2019, 11:24 AM

## 2019-09-03 ENCOUNTER — Encounter: Payer: Self-pay | Admitting: *Deleted

## 2019-09-04 DIAGNOSIS — Z96641 Presence of right artificial hip joint: Secondary | ICD-10-CM | POA: Diagnosis not present

## 2019-09-04 DIAGNOSIS — M109 Gout, unspecified: Secondary | ICD-10-CM | POA: Diagnosis not present

## 2019-09-04 DIAGNOSIS — N1831 Chronic kidney disease, stage 3a: Secondary | ICD-10-CM | POA: Diagnosis not present

## 2019-09-04 DIAGNOSIS — E1151 Type 2 diabetes mellitus with diabetic peripheral angiopathy without gangrene: Secondary | ICD-10-CM | POA: Diagnosis not present

## 2019-09-04 DIAGNOSIS — J449 Chronic obstructive pulmonary disease, unspecified: Secondary | ICD-10-CM | POA: Diagnosis not present

## 2019-09-04 DIAGNOSIS — E1140 Type 2 diabetes mellitus with diabetic neuropathy, unspecified: Secondary | ICD-10-CM | POA: Diagnosis not present

## 2019-09-04 DIAGNOSIS — I5020 Unspecified systolic (congestive) heart failure: Secondary | ICD-10-CM | POA: Diagnosis not present

## 2019-09-04 DIAGNOSIS — I130 Hypertensive heart and chronic kidney disease with heart failure and stage 1 through stage 4 chronic kidney disease, or unspecified chronic kidney disease: Secondary | ICD-10-CM | POA: Diagnosis not present

## 2019-09-04 DIAGNOSIS — M159 Polyosteoarthritis, unspecified: Secondary | ICD-10-CM | POA: Diagnosis not present

## 2019-09-04 DIAGNOSIS — Z471 Aftercare following joint replacement surgery: Secondary | ICD-10-CM | POA: Diagnosis not present

## 2019-09-04 DIAGNOSIS — I2510 Atherosclerotic heart disease of native coronary artery without angina pectoris: Secondary | ICD-10-CM | POA: Diagnosis not present

## 2019-09-07 ENCOUNTER — Other Ambulatory Visit: Payer: Self-pay | Admitting: Internal Medicine

## 2019-09-08 ENCOUNTER — Inpatient Hospital Stay: Payer: Medicare HMO | Attending: Internal Medicine

## 2019-09-08 ENCOUNTER — Ambulatory Visit (HOSPITAL_COMMUNITY)
Admission: RE | Admit: 2019-09-08 | Discharge: 2019-09-08 | Disposition: A | Discharge status: Home / Self Care | Payer: Medicare HMO | Source: Ambulatory Visit | Attending: Internal Medicine | Admitting: Internal Medicine

## 2019-09-08 ENCOUNTER — Other Ambulatory Visit: Payer: Self-pay

## 2019-09-08 ENCOUNTER — Inpatient Hospital Stay: Payer: Medicare HMO

## 2019-09-08 DIAGNOSIS — C3412 Malignant neoplasm of upper lobe, left bronchus or lung: Secondary | ICD-10-CM | POA: Diagnosis not present

## 2019-09-08 DIAGNOSIS — Z7982 Long term (current) use of aspirin: Secondary | ICD-10-CM | POA: Diagnosis not present

## 2019-09-08 DIAGNOSIS — K219 Gastro-esophageal reflux disease without esophagitis: Secondary | ICD-10-CM | POA: Diagnosis not present

## 2019-09-08 DIAGNOSIS — C3490 Malignant neoplasm of unspecified part of unspecified bronchus or lung: Secondary | ICD-10-CM | POA: Insufficient documentation

## 2019-09-08 DIAGNOSIS — E119 Type 2 diabetes mellitus without complications: Secondary | ICD-10-CM | POA: Insufficient documentation

## 2019-09-08 DIAGNOSIS — Z7901 Long term (current) use of anticoagulants: Secondary | ICD-10-CM | POA: Diagnosis not present

## 2019-09-08 DIAGNOSIS — Z7952 Long term (current) use of systemic steroids: Secondary | ICD-10-CM | POA: Diagnosis not present

## 2019-09-08 DIAGNOSIS — E1151 Type 2 diabetes mellitus with diabetic peripheral angiopathy without gangrene: Secondary | ICD-10-CM | POA: Diagnosis not present

## 2019-09-08 DIAGNOSIS — M109 Gout, unspecified: Secondary | ICD-10-CM | POA: Insufficient documentation

## 2019-09-08 DIAGNOSIS — Z923 Personal history of irradiation: Secondary | ICD-10-CM | POA: Diagnosis not present

## 2019-09-08 DIAGNOSIS — E785 Hyperlipidemia, unspecified: Secondary | ICD-10-CM | POA: Diagnosis not present

## 2019-09-08 DIAGNOSIS — R5383 Other fatigue: Secondary | ICD-10-CM | POA: Diagnosis not present

## 2019-09-08 DIAGNOSIS — Y842 Radiological procedure and radiotherapy as the cause of abnormal reaction of the patient, or of later complication, without mention of misadventure at the time of the procedure: Secondary | ICD-10-CM | POA: Insufficient documentation

## 2019-09-08 DIAGNOSIS — M6281 Muscle weakness (generalized): Secondary | ICD-10-CM | POA: Insufficient documentation

## 2019-09-08 DIAGNOSIS — Z79899 Other long term (current) drug therapy: Secondary | ICD-10-CM | POA: Insufficient documentation

## 2019-09-08 DIAGNOSIS — I509 Heart failure, unspecified: Secondary | ICD-10-CM | POA: Diagnosis not present

## 2019-09-08 DIAGNOSIS — J700 Acute pulmonary manifestations due to radiation: Secondary | ICD-10-CM | POA: Insufficient documentation

## 2019-09-08 DIAGNOSIS — I110 Hypertensive heart disease with heart failure: Secondary | ICD-10-CM | POA: Diagnosis not present

## 2019-09-08 LAB — CBC WITH DIFFERENTIAL (CANCER CENTER ONLY)
Abs Immature Granulocytes: 0.09 10*3/uL — ABNORMAL HIGH (ref 0.00–0.07)
Basophils Absolute: 0 10*3/uL (ref 0.0–0.1)
Basophils Relative: 0 %
Eosinophils Absolute: 0.1 10*3/uL (ref 0.0–0.5)
Eosinophils Relative: 1 %
HCT: 34.6 % — ABNORMAL LOW (ref 39.0–52.0)
Hemoglobin: 10.7 g/dL — ABNORMAL LOW (ref 13.0–17.0)
Immature Granulocytes: 1 %
Lymphocytes Relative: 5 %
Lymphs Abs: 0.5 10*3/uL — ABNORMAL LOW (ref 0.7–4.0)
MCH: 30.6 pg (ref 26.0–34.0)
MCHC: 30.9 g/dL (ref 30.0–36.0)
MCV: 98.9 fL (ref 80.0–100.0)
Monocytes Absolute: 0.5 10*3/uL (ref 0.1–1.0)
Monocytes Relative: 5 %
Neutro Abs: 9.5 10*3/uL — ABNORMAL HIGH (ref 1.7–7.7)
Neutrophils Relative %: 88 %
Platelet Count: 93 10*3/uL — ABNORMAL LOW (ref 150–400)
RBC: 3.5 MIL/uL — ABNORMAL LOW (ref 4.22–5.81)
RDW: 18.9 % — ABNORMAL HIGH (ref 11.5–15.5)
WBC Count: 10.7 10*3/uL — ABNORMAL HIGH (ref 4.0–10.5)
nRBC: 0.3 % — ABNORMAL HIGH (ref 0.0–0.2)

## 2019-09-08 LAB — CMP (CANCER CENTER ONLY)
ALT: 39 U/L (ref 0–44)
AST: 21 U/L (ref 15–41)
Albumin: 3.1 g/dL — ABNORMAL LOW (ref 3.5–5.0)
Alkaline Phosphatase: 75 U/L (ref 38–126)
Anion gap: 11 (ref 5–15)
BUN: 30 mg/dL — ABNORMAL HIGH (ref 8–23)
CO2: 27 mmol/L (ref 22–32)
Calcium: 9 mg/dL (ref 8.9–10.3)
Chloride: 103 mmol/L (ref 98–111)
Creatinine: 1.57 mg/dL — ABNORMAL HIGH (ref 0.61–1.24)
GFR, Est AFR Am: 52 mL/min — ABNORMAL LOW (ref >60)
GFR, Estimated: 45 mL/min — ABNORMAL LOW (ref >60)
Glucose, Bld: 113 mg/dL — ABNORMAL HIGH (ref 70–99)
Potassium: 3.4 mmol/L — ABNORMAL LOW (ref 3.5–5.1)
Sodium: 141 mmol/L (ref 135–145)
Total Bilirubin: 0.9 mg/dL (ref 0.3–1.2)
Total Protein: 6 g/dL — ABNORMAL LOW (ref 6.5–8.1)

## 2019-09-11 ENCOUNTER — Inpatient Hospital Stay (HOSPITAL_BASED_OUTPATIENT_CLINIC_OR_DEPARTMENT_OTHER): Payer: Medicare HMO | Admitting: Internal Medicine

## 2019-09-11 ENCOUNTER — Telehealth: Payer: Self-pay | Admitting: Internal Medicine

## 2019-09-11 ENCOUNTER — Other Ambulatory Visit: Payer: Self-pay

## 2019-09-11 ENCOUNTER — Encounter: Payer: Self-pay | Admitting: Internal Medicine

## 2019-09-11 VITALS — BP 128/67 | HR 87 | Temp 98.2°F | Resp 18 | Ht 67.0 in | Wt 201.7 lb

## 2019-09-11 DIAGNOSIS — C3412 Malignant neoplasm of upper lobe, left bronchus or lung: Secondary | ICD-10-CM

## 2019-09-11 DIAGNOSIS — Z923 Personal history of irradiation: Secondary | ICD-10-CM | POA: Diagnosis not present

## 2019-09-11 DIAGNOSIS — I509 Heart failure, unspecified: Secondary | ICD-10-CM | POA: Diagnosis not present

## 2019-09-11 DIAGNOSIS — C3492 Malignant neoplasm of unspecified part of left bronchus or lung: Secondary | ICD-10-CM | POA: Diagnosis not present

## 2019-09-11 DIAGNOSIS — E785 Hyperlipidemia, unspecified: Secondary | ICD-10-CM | POA: Diagnosis not present

## 2019-09-11 DIAGNOSIS — I10 Essential (primary) hypertension: Secondary | ICD-10-CM | POA: Diagnosis not present

## 2019-09-11 DIAGNOSIS — Z9221 Personal history of antineoplastic chemotherapy: Secondary | ICD-10-CM

## 2019-09-11 DIAGNOSIS — R5383 Other fatigue: Secondary | ICD-10-CM | POA: Diagnosis not present

## 2019-09-11 DIAGNOSIS — E1151 Type 2 diabetes mellitus with diabetic peripheral angiopathy without gangrene: Secondary | ICD-10-CM | POA: Diagnosis not present

## 2019-09-11 DIAGNOSIS — M6281 Muscle weakness (generalized): Secondary | ICD-10-CM | POA: Diagnosis not present

## 2019-09-11 DIAGNOSIS — R590 Localized enlarged lymph nodes: Secondary | ICD-10-CM

## 2019-09-11 DIAGNOSIS — J700 Acute pulmonary manifestations due to radiation: Secondary | ICD-10-CM

## 2019-09-11 DIAGNOSIS — Z79899 Other long term (current) drug therapy: Secondary | ICD-10-CM | POA: Diagnosis not present

## 2019-09-11 DIAGNOSIS — I110 Hypertensive heart disease with heart failure: Secondary | ICD-10-CM | POA: Diagnosis not present

## 2019-09-11 DIAGNOSIS — C3490 Malignant neoplasm of unspecified part of unspecified bronchus or lung: Secondary | ICD-10-CM | POA: Diagnosis not present

## 2019-09-11 NOTE — Progress Notes (Signed)
[place] [phone]   [phone]  OFFICE PROGRESS NOTE  [name], PA-C [place] [address]  DIAGNOSIS: stage IIIc (T2 a, N3, M0) non-small cell lung cancer, squamous cell carcinoma diagnosed in October 2020 and presented with left upper lobe pleural-based mass in addition to left hilar and right paratracheal lymphadenopathy.  PRIOR THERAPY:Concurrent chemoradiation with weekly carboplatin for AUC of 2 and paclitaxel 45 mg/M2.  First dose was on May 08, 2019.  Status post 7 cycles.  . CURRENT THERAPY: Observation.  INTERVAL HISTORY: [name] [name]. 69 y.o. male returns to the clinic today for follow-up visit.  The patient is feeling much better after he started a course of a tapered prednisone and doxycycline.  He has less shortness of breath than before.  He denied having any chest pain, cough or hemoptysis.  He denied having any fever or chills.  He has no nausea, vomiting, diarrhea or constipation.  He denied having any headache or visual changes.  He is here today for evaluation with repeat CT scan of the chest for restaging of his disease.  He continues to have swelling of the lower extremities.   MEDICAL HISTORY: Past Medical History:  Diagnosis Date  . AICD (automatic cardioverter/defibrillator) present    reports no shocks  - st Jude  . Ambulates with cane   . CAD (coronary artery disease)    a. 04/2013 Cath: LM 10, LAD 40p, 43m, D1 50p, LCX 60p, 67m/100m, OM1 50, OM2 100 L-L collats, RCA 100p;  b. CABG x 5: LIMA->LAD, VG->D2, VG->RI->OM2, VG->PDA.  . Cataracts, bilateral   . Chronic systolic CHF (congestive heart failure) ([place])    a. 04/2013 Echo: EF 15-20%, diff HK, sev HK of inf and ant myocardium, dilated LA,  b. EF 20-25% with restrictive filling pattern, mod RV dysfx, mild MR (10/10/2013);  c.  Echo 12/15: EF 25-30%  . COPD (chronic obstructive pulmonary disease) ([place])   . Diabetes mellitus without  complication ([place])    unsure what type but was dx as an adult, takes no meds, check his cbg at home 2 times a week   . Emphysema   . GERD (gastroesophageal reflux disease)   . Gout   . Hyperlipidemia   . Hypertension   . Ischemic cardiomyopathy   . lung ca   . PVD (peripheral vascular disease) ([place])    a. (10/2013) ABIs: RIGHT 0.63, Waveforms: monophasic;  LEFT 0.78, Waveforms: monophasic  . Tobacco abuse    30+ pack-year history  . Transaminitis    a. 04/2013 Abd U/S and CT unremarkable, hepatitis panel neg -->felt to be 2/2 acute R heart failure.    ALLERGIES:  is allergic to penicillins.  MEDICATIONS:  Current Outpatient Medications  Medication Sig Dispense Refill  . acetaminophen (TYLENOL) 325 MG tablet Take by mouth every 6 (six) hours as needed for mild pain or moderate pain.    Marland Kitchen albuterol (ACCUNEB) 1.25 MG/3ML nebulizer solution Take 1 ampule by nebulization every 6 (six) hours as needed for wheezing.    Marland Kitchen albuterol (VENTOLIN HFA) 108 (90 Base) MCG/ACT inhaler Inhale 1-2 puffs into the lungs every 6 (six) hours as needed for wheezing or shortness of breath. 6.7 g 6  . allopurinol (ZYLOPRIM) 300 MG tablet Take 300 mg by mouth daily.    Marland Kitchen amiodarone (PACERONE) 400 MG tablet Take 0.5 tablets (200 mg total) by mouth daily. (Patient taking differently: Take 400 mg by mouth  2 (two) times daily. ) 30 tablet 5  . aspirin EC 81 MG tablet Take 81 mg by mouth daily.    Marland Kitchen atorvastatin (LIPITOR) 20 MG tablet Take 20 mg by mouth daily.    Marland Kitchen azelastine (OPTIVAR) 0.05 % ophthalmic solution Place 1 drop into the left eye 2 (two) times daily.   0  . b complex vitamins tablet Take 1 tablet by mouth at bedtime.     . B Complex-C-Folic Acid (RENA-VITE RX) 1 MG TABS Take 1 tablet by mouth daily.    . chlorproMAZINE (THORAZINE) 25 MG tablet Take 1 tablet (25 mg total) by mouth 3 (three) times daily as needed. (Patient taking differently: Take 25 mg by mouth 3 (three) times daily. ) 30 tablet 0  .  Cholecalciferol (VITAMIN D3) 25 MCG (1000 UT) CAPS Take 1,000 Units by mouth daily at 12 noon.     Marland Kitchen ELIQUIS 5 MG TABS tablet Take 5 mg by mouth 2 (two) times daily.     . furosemide (LASIX) 40 MG tablet Take 1 tablet (40 mg total) by mouth 2 (two) times daily. (Patient taking differently: Take 40 mg by mouth daily. ) 60 tablet 3  . guaiFENesin-dextromethorphan (ROBITUSSIN DM) 100-10 MG/5ML syrup Take 5 mLs by mouth every 4 (four) hours as needed for cough.    . hydrALAZINE (APRESOLINE) 25 MG tablet TAKE 0.5 TABLETS (12.5 MG TOTAL) BY MOUTH 3 (THREE) TIMES DAILY. 45 tablet 3  . Melatonin 3 MG TABS Take 3 mg by mouth at bedtime.    . metoprolol succinate (TOPROL-XL) 50 MG 24 hr tablet Take 50 mg by mouth in the morning and at bedtime.     Marland Kitchen omeprazole (PRILOSEC) 40 MG capsule Take 40 mg by mouth daily.    . potassium chloride SA (KLOR-CON) 20 MEQ tablet Take 1 tablet (20 mEq total) by mouth daily. (Patient taking differently: Take 20 mEq by mouth daily at 12 noon. ) 2 tablet 0  . predniSONE (DELTASONE) 20 MG tablet 4 tablet p.o. daily for 1 week then 3 tablet p.o. daily for 1 week then 2 tablet p.o. daily for 1 week then 1 tablet p.o. daily for 1 week until I see the patient. (Patient not taking: Reported on 08/30/2019) 70 tablet 0  . promethazine (PHENERGAN) 25 MG tablet Take 25 mg by mouth every 6 (six) hours as needed for nausea or vomiting.    . senna (SENOKOT) 8.6 MG TABS tablet Take 1 tablet (8.6 mg total) by mouth 2 (two) times daily. (Patient taking differently: Take 1 tablet by mouth at bedtime. ) 120 each 0  . spironolactone (ALDACTONE) 25 MG tablet Take 1 tablet (25 mg total) by mouth daily. (Patient taking differently: Take 12.5 mg by mouth at bedtime. ) 90 tablet 1  . sucralfate (CARAFATE) 1 g tablet Take 1 tablet (1 g total) by mouth 4 (four) times daily -  with meals and at bedtime. 5 min before meals for radiation induced esophagitis (Patient taking differently: Take 1 g by mouth 4 (four)  times daily. 5 min before meals for radiation induced esophagitis) 120 tablet 2  . tiZANidine (ZANAFLEX) 4 MG capsule Take 4 mg by mouth 3 (three) times daily as needed for muscle spasms.      No current facility-administered medications for this visit.    SURGICAL HISTORY:  Past Surgical History:  Procedure Laterality Date  . APPENDECTOMY  as a child  . CARDIAC CATHETERIZATION  04/08/2015   Procedure: Left Heart  Cath and Cors/Grafts Angiography;  Surgeon: Peter M Martinique, MD;  Location: [facility];  Service: Cardiovascular;;  . COLONOSCOPY    . CORONARY ARTERY BYPASS GRAFT N/A 05/05/2013   Procedure: CORONARY ARTERY BYPASS GRAFTING (CABG) TIMES FIVE USING LEFT INTERNAL MAMMARY ARTERY AND RIGHT AND LEFT SAPHENOUS LEG VEIN HARVESTED ENDOSCOPICALLY;  Surgeon: Melrose Nakayama, MD;  Location: [facility];  Service: Open Heart Surgery;  Laterality: N/A;  . IMPLANTABLE CARDIOVERTER DEFIBRILLATOR IMPLANT N/A 07/06/2014   Procedure: St Jude IMPLANTABLE CARDIOVERTER DEFIBRILLATOR IMPLANT;  Surgeon: Deboraha Sprang, MD;  Location: [facility] CATH LAB;  Service: Cardiovascular;  Laterality: N/A;  . LEFT AND RIGHT HEART CATHETERIZATION WITH CORONARY ANGIOGRAM N/A 04/28/2013   Procedure: LEFT AND RIGHT HEART CATHETERIZATION WITH CORONARY ANGIOGRAM;  Surgeon: Burnell Blanks, MD;  Location: [facility] CATH LAB;  Service: Cardiovascular;  Laterality: N/A;  . MULTIPLE EXTRACTIONS WITH ALVEOLOPLASTY N/A 05/03/2013   Procedure: Extraction of tooth #s 3,4,5,6,8,9,27 with alveoloplast and maxillary left osseous tuberosity reduction;  Surgeon: Lenn Cal, DDS;  Location: [facility];  Service: Oral Surgery;  Laterality: N/A;  . TEE WITHOUT CARDIOVERSION N/A 09/01/2019   Procedure: TRANSESOPHAGEAL ECHOCARDIOGRAM (TEE);  Surgeon: Larey Dresser, MD;  Location: [facility] ENDOSCOPY;  Service: Cardiovascular;  Laterality: N/A;  . TOTAL HIP ARTHROPLASTY Right 11/30/2018   Procedure: TOTAL HIP ARTHROPLASTY ANTERIOR APPROACH;   Surgeon: Rod Can, MD;  Location: [facility];  Service: Orthopedics;  Laterality: Right;  Marland Kitchen VIDEO BRONCHOSCOPY WITH ENDOBRONCHIAL ULTRASOUND N/A 04/20/2019   Procedure: VIDEO BRONCHOSCOPY WITH ENDOBRONCHIAL ULTRASOUND;  Surgeon: Margaretha Seeds, MD;  Location: [facility];  Service: Thoracic;  Laterality: N/A;    REVIEW OF SYSTEMS:  Constitutional: positive for fatigue Eyes: negative Ears, nose, mouth, throat, and face: negative Respiratory: negative Cardiovascular: negative Gastrointestinal: negative Genitourinary:negative Integument/breast: negative Hematologic/lymphatic: negative Musculoskeletal:positive for muscle weakness Neurological: negative Behavioral/Psych: negative Endocrine: negative Allergic/Immunologic: negative   PHYSICAL EXAMINATION: General appearance: alert, cooperative, fatigued and no distress Head: Normocephalic, without obvious abnormality, atraumatic Neck: no adenopathy, no JVD, supple, symmetrical, trachea midline and thyroid not enlarged, symmetric, no tenderness/mass/nodules Lymph nodes: Cervical, supraclavicular, and axillary nodes normal. Resp: clear to auscultation bilaterally Back: symmetric, no curvature. ROM normal. No CVA tenderness. Cardio: regular rate and rhythm, S1, S2 normal, no murmur, click, rub or gallop GI: soft, non-tender; bowel sounds normal; no masses,  no organomegaly Extremities: edema 1+ edema bilaterally Neurologic: Alert and oriented X 3, normal strength and tone. Normal symmetric reflexes. Normal coordination and gait  ECOG PERFORMANCE STATUS: 1 - Symptomatic but completely ambulatory I did not have chance to sleep this weekend but could not drive drive all the way blood stool but he but adult note brought to [facility] for unknown  Blood pressure 128/67, pulse 87, temperature 98.2 F (36.8 C), temperature source Oral, resp. rate 18, height 5\' 7"  (1.702 m), weight 201 lb 11.2 oz (91.5 kg), SpO2 100 %.  LABORATORY DATA: Lab  Results  Component Value Date   WBC 10.7 (H) 09/08/2019   HGB 10.7 (L) 09/08/2019   HCT 34.6 (L) 09/08/2019   MCV 98.9 09/08/2019   PLT 93 (L) 09/08/2019      Chemistry      Component Value Date/Time   NA 141 09/08/2019 1148   NA 140 08/23/2019 1020   K 3.4 (L) 09/08/2019 1148   CL 103 09/08/2019 1148   CO2 27 09/08/2019 1148   BUN 30 (H) 09/08/2019 1148   BUN 52 (H) 08/23/2019 1020   CREATININE  1.57 (H) 09/08/2019 1148   CREATININE 1.35 (H) 12/25/2015 1310      Component Value Date/Time   CALCIUM 9.0 09/08/2019 1148   ALKPHOS 75 09/08/2019 1148   AST 21 09/08/2019 1148   ALT 39 09/08/2019 1148   BILITOT 0.9 09/08/2019 1148       RADIOGRAPHIC STUDIES: CT Chest Wo Contrast  Result Date: 09/08/2019 CLINICAL DATA:  Left upper lobe non-small cell lung cancer status post radiation therapy completed in January, with ongoing chemotherapy. Restaging. Interval prednisone and antibiotic therapy for pulmonary opacities. EXAM: CT CHEST WITHOUT CONTRAST TECHNIQUE: Multidetector CT imaging of the chest was performed following the standard protocol without IV contrast. COMPARISON:  08/11/2019 chest CT. FINDINGS: Cardiovascular: Mild cardiomegaly. No significant pericardial effusion/thickening. Stable configuration of single lead left subclavian ICD with lead tip in the right ventricular apex. Three-vessel coronary atherosclerosis status post CABG. Atherosclerotic nonaneurysmal thoracic aorta. Normal caliber pulmonary arteries. Mediastinum/Nodes: Hypodense subcentimeter left thyroid nodule, mildly decreased. No follow-up recommended unless clinically warranted (ref: J Am Coll Radiol. 2015 Feb;12(2): 143-50). Unremarkable esophagus. No axillary adenopathy. Previously visualized enlarged 1.1 cm right paratracheal node has decreased to 0.6 cm (series 2/image 57). No pathologically enlarged mediastinal or discrete hilar nodes on this noncontrast scan. Lungs/Pleura: No pneumothorax. No pleural  effusion. Cavitary subpleural peripheral left upper lobe 1.4 x 1.1 cm nodule (series 5/image 62), previously 1.5 x 1.3 cm, slightly decreased, with slightly decreased overlying pleural thickening. Previously visualized extensive patchy consolidation and ground-glass opacity throughout both lungs has decreased, with persistent moderate patchy consolidation and ground-glass opacity throughout all lung lobes, most prominent in the upper lobes. No new significant pulmonary nodules. Stable calcified tiny posterior left upper lobe granuloma. Upper abdomen: Simple 2.9 cm anterior upper right renal cyst. Musculoskeletal: No aggressive appearing focal osseous lesions. Intact sternotomy wires. Mild thoracic spondylosis. IMPRESSION: 1. Peripheral left upper lobe 1.4 cm cavitary nodule, slightly decreased in size, compatible with continued positive response to therapy. 2. Decreased mild mediastinal lymphadenopathy. No findings suspicious for new or progressive metastatic disease in the chest. 3. Decreased patchy consolidation and ground-glass opacity throughout both lungs, most compatible with improving multilobar pneumonia. 4. Mild cardiomegaly. 5. Aortic Atherosclerosis (ICD10-I70.0). Electronically Signed   By: Ilona Sorrel M.D.   On: 09/08/2019 14:56   CUP PACEART INCLINIC DEVICE CHECK  Result Date: 08/23/2019 ICD check in clinic. Normal device function. Thresholds and sensing consistent with previous device measurements. Impedance trends stable over time. No mode switches. No ventricular arrhythmias. Histogram distribution appropriate for patient and level of  activity. No changes made this session. Device programmed at appropriate safety margins. Estimated longevity 4 yr, 9 mo. Pt enrolled in remote follow-up. RTC 3 months with Dr. Caryl Comes   ASSESSMENT AND PLAN: This is a very pleasant 69 years old male with stage IIIc non-small cell lung cancer, squamous cell carcinoma presented with left upper lobe pleural-based  mass in addition to left hilar and right paramediastinal lymphadenopathy. The patient underwent a course of concurrent chemoradiation with weekly carboplatin and paclitaxel status post 7 cycles.   He tolerated his treatment well except for odynophagia and fatigue. 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  The patient was treated with a course of taper prednisone as well as doxycycline.  He felt much better. He had repeat CT scan of the chest performed recently.  I personally and independently reviewed the scan images and discussed the results with the patient today. 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 I  discussed with the patient his treatment options including consolidation immunotherapy with Imfinzi versus palliative care and continuous observation. The patient is not interested in treatment and he would like to continue on observation. I will see him back for follow-up visit in 3 months with repeat CT scan of the chest. For the congestive heart failure, he will continue his current treatment with Lasix as prescribed by his cardiologist. He was advised to call immediately if he has any concerning symptoms in the interval. The patient voices understanding of current disease status and treatment options and is in agreement with the current care plan.  All questions were answered. The patient knows to call the clinic with any problems, questions or concerns. We can certainly see the patient much sooner if necessary.  Disclaimer: This note was dictated with voice recognition software. Similar sounding words can inadvertently be transcribed and may not be corrected upon review.     For long-term

## 2019-09-11 NOTE — Telephone Encounter (Signed)
Scheduled per los. Gave avs and calendar  

## 2019-09-14 ENCOUNTER — Telehealth: Payer: Self-pay | Admitting: *Deleted

## 2019-09-14 DIAGNOSIS — N1831 Chronic kidney disease, stage 3a: Secondary | ICD-10-CM | POA: Diagnosis not present

## 2019-09-14 DIAGNOSIS — M109 Gout, unspecified: Secondary | ICD-10-CM | POA: Diagnosis not present

## 2019-09-14 DIAGNOSIS — I2510 Atherosclerotic heart disease of native coronary artery without angina pectoris: Secondary | ICD-10-CM | POA: Diagnosis not present

## 2019-09-14 DIAGNOSIS — M159 Polyosteoarthritis, unspecified: Secondary | ICD-10-CM | POA: Diagnosis not present

## 2019-09-14 DIAGNOSIS — I5020 Unspecified systolic (congestive) heart failure: Secondary | ICD-10-CM | POA: Diagnosis not present

## 2019-09-14 DIAGNOSIS — E1151 Type 2 diabetes mellitus with diabetic peripheral angiopathy without gangrene: Secondary | ICD-10-CM | POA: Diagnosis not present

## 2019-09-14 DIAGNOSIS — E1140 Type 2 diabetes mellitus with diabetic neuropathy, unspecified: Secondary | ICD-10-CM | POA: Diagnosis not present

## 2019-09-14 DIAGNOSIS — J449 Chronic obstructive pulmonary disease, unspecified: Secondary | ICD-10-CM | POA: Diagnosis not present

## 2019-09-14 DIAGNOSIS — I130 Hypertensive heart and chronic kidney disease with heart failure and stage 1 through stage 4 chronic kidney disease, or unspecified chronic kidney disease: Secondary | ICD-10-CM | POA: Diagnosis not present

## 2019-09-14 NOTE — Telephone Encounter (Signed)
Received call from sister, [name] [name] asking for results of test.  She states she was unable to listen in b/c she was out of town.  Informed her of CT results that were improved & also of f/u appts.

## 2019-09-18 DIAGNOSIS — N1831 Chronic kidney disease, stage 3a: Secondary | ICD-10-CM | POA: Diagnosis not present

## 2019-09-18 DIAGNOSIS — M159 Polyosteoarthritis, unspecified: Secondary | ICD-10-CM | POA: Diagnosis not present

## 2019-09-18 DIAGNOSIS — J449 Chronic obstructive pulmonary disease, unspecified: Secondary | ICD-10-CM | POA: Diagnosis not present

## 2019-09-18 DIAGNOSIS — I5020 Unspecified systolic (congestive) heart failure: Secondary | ICD-10-CM | POA: Diagnosis not present

## 2019-09-18 DIAGNOSIS — I2510 Atherosclerotic heart disease of native coronary artery without angina pectoris: Secondary | ICD-10-CM | POA: Diagnosis not present

## 2019-09-18 DIAGNOSIS — E1151 Type 2 diabetes mellitus with diabetic peripheral angiopathy without gangrene: Secondary | ICD-10-CM | POA: Diagnosis not present

## 2019-09-18 DIAGNOSIS — M109 Gout, unspecified: Secondary | ICD-10-CM | POA: Diagnosis not present

## 2019-09-18 DIAGNOSIS — I130 Hypertensive heart and chronic kidney disease with heart failure and stage 1 through stage 4 chronic kidney disease, or unspecified chronic kidney disease: Secondary | ICD-10-CM | POA: Diagnosis not present

## 2019-09-18 DIAGNOSIS — E1140 Type 2 diabetes mellitus with diabetic neuropathy, unspecified: Secondary | ICD-10-CM | POA: Diagnosis not present

## 2019-09-19 DIAGNOSIS — J449 Chronic obstructive pulmonary disease, unspecified: Secondary | ICD-10-CM | POA: Diagnosis not present

## 2019-09-19 DIAGNOSIS — M159 Polyosteoarthritis, unspecified: Secondary | ICD-10-CM | POA: Diagnosis not present

## 2019-09-19 DIAGNOSIS — I2510 Atherosclerotic heart disease of native coronary artery without angina pectoris: Secondary | ICD-10-CM | POA: Diagnosis not present

## 2019-09-19 DIAGNOSIS — I130 Hypertensive heart and chronic kidney disease with heart failure and stage 1 through stage 4 chronic kidney disease, or unspecified chronic kidney disease: Secondary | ICD-10-CM | POA: Diagnosis not present

## 2019-09-19 DIAGNOSIS — E1140 Type 2 diabetes mellitus with diabetic neuropathy, unspecified: Secondary | ICD-10-CM | POA: Diagnosis not present

## 2019-09-19 DIAGNOSIS — E1151 Type 2 diabetes mellitus with diabetic peripheral angiopathy without gangrene: Secondary | ICD-10-CM | POA: Diagnosis not present

## 2019-09-19 DIAGNOSIS — M109 Gout, unspecified: Secondary | ICD-10-CM | POA: Diagnosis not present

## 2019-09-19 DIAGNOSIS — N1831 Chronic kidney disease, stage 3a: Secondary | ICD-10-CM | POA: Diagnosis not present

## 2019-09-19 DIAGNOSIS — I5020 Unspecified systolic (congestive) heart failure: Secondary | ICD-10-CM | POA: Diagnosis not present

## 2019-09-21 DIAGNOSIS — M109 Gout, unspecified: Secondary | ICD-10-CM | POA: Diagnosis not present

## 2019-09-21 DIAGNOSIS — E1140 Type 2 diabetes mellitus with diabetic neuropathy, unspecified: Secondary | ICD-10-CM | POA: Diagnosis not present

## 2019-09-21 DIAGNOSIS — I2510 Atherosclerotic heart disease of native coronary artery without angina pectoris: Secondary | ICD-10-CM | POA: Diagnosis not present

## 2019-09-21 DIAGNOSIS — I5020 Unspecified systolic (congestive) heart failure: Secondary | ICD-10-CM | POA: Diagnosis not present

## 2019-09-21 DIAGNOSIS — E1151 Type 2 diabetes mellitus with diabetic peripheral angiopathy without gangrene: Secondary | ICD-10-CM | POA: Diagnosis not present

## 2019-09-21 DIAGNOSIS — J449 Chronic obstructive pulmonary disease, unspecified: Secondary | ICD-10-CM | POA: Diagnosis not present

## 2019-09-21 DIAGNOSIS — M159 Polyosteoarthritis, unspecified: Secondary | ICD-10-CM | POA: Diagnosis not present

## 2019-09-21 DIAGNOSIS — I130 Hypertensive heart and chronic kidney disease with heart failure and stage 1 through stage 4 chronic kidney disease, or unspecified chronic kidney disease: Secondary | ICD-10-CM | POA: Diagnosis not present

## 2019-09-21 DIAGNOSIS — N1831 Chronic kidney disease, stage 3a: Secondary | ICD-10-CM | POA: Diagnosis not present

## 2019-09-26 ENCOUNTER — Ambulatory Visit (INDEPENDENT_AMBULATORY_CARE_PROVIDER_SITE_OTHER): Payer: Medicare HMO | Admitting: *Deleted

## 2019-09-26 DIAGNOSIS — I255 Ischemic cardiomyopathy: Secondary | ICD-10-CM

## 2019-09-27 LAB — CUP PACEART REMOTE DEVICE CHECK
Battery Remaining Longevity: 55 mo
Battery Remaining Percentage: 54 %
Battery Voltage: 2.92 V
Brady Statistic RV Percent Paced: 0 %
Date Time Interrogation Session: 20210407095225
HighPow Impedance: 73 Ohm
HighPow Impedance: 73 Ohm
Implantable Lead Implant Date: 20160115
Implantable Lead Location: 753860
Implantable Lead Model: 7122
Implantable Pulse Generator Implant Date: 20160115
Lead Channel Impedance Value: 360 Ohm
Lead Channel Pacing Threshold Amplitude: 1.25 V
Lead Channel Pacing Threshold Pulse Width: 0.5 ms
Lead Channel Sensing Intrinsic Amplitude: 5.3 mV
Lead Channel Setting Pacing Amplitude: 2.5 V
Lead Channel Setting Pacing Pulse Width: 0.5 ms
Lead Channel Setting Sensing Sensitivity: 0.5 mV
Pulse Gen Serial Number: 7233603

## 2019-09-28 DIAGNOSIS — M109 Gout, unspecified: Secondary | ICD-10-CM | POA: Diagnosis not present

## 2019-09-28 DIAGNOSIS — I2510 Atherosclerotic heart disease of native coronary artery without angina pectoris: Secondary | ICD-10-CM | POA: Diagnosis not present

## 2019-09-28 DIAGNOSIS — N1831 Chronic kidney disease, stage 3a: Secondary | ICD-10-CM | POA: Diagnosis not present

## 2019-09-28 DIAGNOSIS — I130 Hypertensive heart and chronic kidney disease with heart failure and stage 1 through stage 4 chronic kidney disease, or unspecified chronic kidney disease: Secondary | ICD-10-CM | POA: Diagnosis not present

## 2019-09-28 DIAGNOSIS — E1140 Type 2 diabetes mellitus with diabetic neuropathy, unspecified: Secondary | ICD-10-CM | POA: Diagnosis not present

## 2019-09-28 DIAGNOSIS — J449 Chronic obstructive pulmonary disease, unspecified: Secondary | ICD-10-CM | POA: Diagnosis not present

## 2019-09-28 DIAGNOSIS — I5020 Unspecified systolic (congestive) heart failure: Secondary | ICD-10-CM | POA: Diagnosis not present

## 2019-09-28 DIAGNOSIS — M159 Polyosteoarthritis, unspecified: Secondary | ICD-10-CM | POA: Diagnosis not present

## 2019-09-28 DIAGNOSIS — E1151 Type 2 diabetes mellitus with diabetic peripheral angiopathy without gangrene: Secondary | ICD-10-CM | POA: Diagnosis not present

## 2019-09-29 ENCOUNTER — Ambulatory Visit (HOSPITAL_COMMUNITY)
Admission: RE | Admit: 2019-09-29 | Discharge: 2019-09-29 | Disposition: A | Discharge status: Home / Self Care | Payer: Medicare HMO | Source: Ambulatory Visit | Attending: Cardiology | Admitting: Cardiology

## 2019-09-29 ENCOUNTER — Encounter (HOSPITAL_COMMUNITY): Payer: Self-pay | Admitting: Cardiology

## 2019-09-29 ENCOUNTER — Other Ambulatory Visit: Payer: Self-pay

## 2019-09-29 VITALS — BP 122/58 | HR 84 | Wt 196.4 lb

## 2019-09-29 DIAGNOSIS — C3490 Malignant neoplasm of unspecified part of unspecified bronchus or lung: Secondary | ICD-10-CM | POA: Insufficient documentation

## 2019-09-29 DIAGNOSIS — I130 Hypertensive heart and chronic kidney disease with heart failure and stage 1 through stage 4 chronic kidney disease, or unspecified chronic kidney disease: Secondary | ICD-10-CM | POA: Diagnosis not present

## 2019-09-29 DIAGNOSIS — Z79899 Other long term (current) drug therapy: Secondary | ICD-10-CM | POA: Insufficient documentation

## 2019-09-29 DIAGNOSIS — E785 Hyperlipidemia, unspecified: Secondary | ICD-10-CM | POA: Insufficient documentation

## 2019-09-29 DIAGNOSIS — J449 Chronic obstructive pulmonary disease, unspecified: Secondary | ICD-10-CM | POA: Insufficient documentation

## 2019-09-29 DIAGNOSIS — I2510 Atherosclerotic heart disease of native coronary artery without angina pectoris: Secondary | ICD-10-CM | POA: Insufficient documentation

## 2019-09-29 DIAGNOSIS — Z87891 Personal history of nicotine dependence: Secondary | ICD-10-CM | POA: Diagnosis not present

## 2019-09-29 DIAGNOSIS — Z923 Personal history of irradiation: Secondary | ICD-10-CM | POA: Diagnosis not present

## 2019-09-29 DIAGNOSIS — E1122 Type 2 diabetes mellitus with diabetic chronic kidney disease: Secondary | ICD-10-CM | POA: Diagnosis not present

## 2019-09-29 DIAGNOSIS — I255 Ischemic cardiomyopathy: Secondary | ICD-10-CM | POA: Diagnosis not present

## 2019-09-29 DIAGNOSIS — I4510 Unspecified right bundle-branch block: Secondary | ICD-10-CM | POA: Diagnosis not present

## 2019-09-29 DIAGNOSIS — Z951 Presence of aortocoronary bypass graft: Secondary | ICD-10-CM | POA: Insufficient documentation

## 2019-09-29 DIAGNOSIS — I5022 Chronic systolic (congestive) heart failure: Secondary | ICD-10-CM | POA: Diagnosis not present

## 2019-09-29 DIAGNOSIS — Z833 Family history of diabetes mellitus: Secondary | ICD-10-CM | POA: Diagnosis not present

## 2019-09-29 DIAGNOSIS — Z7901 Long term (current) use of anticoagulants: Secondary | ICD-10-CM | POA: Diagnosis not present

## 2019-09-29 DIAGNOSIS — Z8249 Family history of ischemic heart disease and other diseases of the circulatory system: Secondary | ICD-10-CM | POA: Insufficient documentation

## 2019-09-29 DIAGNOSIS — M109 Gout, unspecified: Secondary | ICD-10-CM | POA: Insufficient documentation

## 2019-09-29 DIAGNOSIS — I480 Paroxysmal atrial fibrillation: Secondary | ICD-10-CM | POA: Diagnosis not present

## 2019-09-29 DIAGNOSIS — E1151 Type 2 diabetes mellitus with diabetic peripheral angiopathy without gangrene: Secondary | ICD-10-CM | POA: Insufficient documentation

## 2019-09-29 DIAGNOSIS — N183 Chronic kidney disease, stage 3 unspecified: Secondary | ICD-10-CM | POA: Diagnosis not present

## 2019-09-29 DIAGNOSIS — I340 Nonrheumatic mitral (valve) insufficiency: Secondary | ICD-10-CM | POA: Insufficient documentation

## 2019-09-29 DIAGNOSIS — Z96642 Presence of left artificial hip joint: Secondary | ICD-10-CM | POA: Insufficient documentation

## 2019-09-29 LAB — BASIC METABOLIC PANEL
Anion gap: 11 (ref 5–15)
BUN: 18 mg/dL (ref 8–23)
CO2: 25 mmol/L (ref 22–32)
Calcium: 9.5 mg/dL (ref 8.9–10.3)
Chloride: 103 mmol/L (ref 98–111)
Creatinine, Ser: 1.61 mg/dL — ABNORMAL HIGH (ref 0.61–1.24)
GFR calc Af Amer: 50 mL/min — ABNORMAL LOW (ref >60)
GFR calc non Af Amer: 43 mL/min — ABNORMAL LOW (ref >60)
Glucose, Bld: 100 mg/dL — ABNORMAL HIGH (ref 70–99)
Potassium: 3.8 mmol/L (ref 3.5–5.1)
Sodium: 139 mmol/L (ref 135–145)

## 2019-09-29 MED ORDER — FUROSEMIDE 40 MG PO TABS: 40.0000 mg | ORAL_TABLET | Freq: Every day | ORAL | 5 refills | Status: DC

## 2019-09-29 MED ORDER — SACUBITRIL-VALSARTAN 24-26 MG PO TABS: 1.0000 | ORAL_TABLET | Freq: Two times a day (BID) | ORAL | 5 refills | Status: DC

## 2019-09-29 MED ORDER — SPIRONOLACTONE 25 MG PO TABS: 25.0000 mg | ORAL_TABLET | Freq: Every day | ORAL | 1 refills | Status: DC

## 2019-09-29 NOTE — Patient Instructions (Addendum)
STOP Aspirin    DECREASE Lasix 40mg  (1 tab) daily   START Entresto 24/26mg  (1 tab) twice day. You were provided a coupon for the first 30 days to be free. Give it to the pharmacist.     TAKE Spironolactone 25mg  (1 tab) at night   Labs today and repeat in 10 days We will only contact you if something comes back abnormal or we need to make some changes. Otherwise no news is good news!  Your physician recommends that you schedule a follow-up appointment in: 1 month with Nurse Practitioner   Please call office at [phone] option 2 if you have any questions or concerns.   At the [facility], you and your health needs are our priority. As part of our continuing mission to provide you with exceptional heart care, we have created designated Provider Care Teams. These Care Teams include your primary Cardiologist (physician) and Advanced Practice Providers (APPs- Physician Assistants and Nurse Practitioners) who all work together to provide you with the care you need, when you need it.   You may see any of the following providers on your designated Care Team at your next follow up: Marland Kitchen Dr Glori Bickers . Dr Loralie Champagne . Darrick Grinder, NP . Lyda Jester, PA . Audry Riles, PharmD   Please be sure to bring in all your medications bottles to every appointment.

## 2019-09-29 NOTE — Progress Notes (Signed)
Heart and Vascular Care Navigation  09/29/2019  [name] [name]. 26-Apr-1951 [id number]  Reason for Referral: CSW consulted to concerns with pt medication management at home.  Assessment: CSW met with pt to discuss support at home.  Pt reports he currently has home health nurse that comes out and helps him fill his med box- believes this was set up following a recent admission to [facility] also reports that he has a home health aid that comes out 5 days a week for about 4 hours a day to help him with housework, ADLs, and transportation.  HRT/VAS Care Coordination    Patients Home Cardiology Office  Heart Failure Clinic   Outpatient Care Team  Social Worker; [place] Agency Name:  [facility] [phone], Fax: [phone]   Social Worker Name:  [name] [name]- [facility]- [phone]   Living arrangements for the past 2 months  Apartment   Lives with:  Self   Patient Current Insurance Coverage  Managed Medicare; Medicaid   Patient Has Concern With Paying Medical Bills  No   Does Patient Have Prescription Coverage?  Yes   Home Assistive Devices/Equipment  Blood pressure cuff; Shower chair with back; Cane (specify quad or straight) straight cane   Current home services  Homehealth aide 5 days a week about 4 hours a day- seems to be PCS through Medicaid      Social History: SDOH Screenings   Alcohol Screen:   . Last Alcohol Screening Score (AUDIT):   Depression (PHQ2-9):   . PHQ-2 Score:   Financial Resource Strain: Low Risk   . Difficulty of Paying Living Expenses: Not very hard  Food Insecurity: No Food Insecurity  . Worried About Charity fundraiser in the Last Year: Never true  . Ran Out of Food in the Last Year: Never true  Housing: Low Risk   . Last Housing Risk Score: 0  Physical Activity:   . Days of Exercise per Week:   . Minutes of Exercise per Session:   Social Connections:   . Frequency of Communication  with Friends and Family:   . Frequency of Social Gatherings with Friends and Family:   . Attends Religious Services:   . Active Member of Clubs or Organizations:   . Attends Archivist Meetings:   [name] [name] Kitchen Marital Status:   Stress:   . Feeling of Stress :   Tobacco Use: Medium Risk  . Smoking Tobacco Use: Former Smoker  . Smokeless Tobacco Use: Never Used  Transportation Needs: No Transportation Needs  . Lack of Transportation (Medical): No  . Lack of Transportation (Non-Medical): No    SDOH Interventions: Financial Resources:    Reports being able to pay for bills and purchase necessities without too much strain.  Food Insecurity:   none reported- states always having plenty of food  Housing Insecurity:    none reported  Transportation:     utilizes home health aid and friends to get to appts.    Barriers to Care: none at this time  Follow-up plan: CSW reached out to pt home health RN, [name] [name] [phone], to inform of med changes and request she complete home visit today to make adjustments.  [name] unable to do visit today due to scheduling but CSW called the main home health line and they report they can get someone out today or tomorrow to assist with med changes.  CSW faxed AVS to home health agency so  they would have up to day med list.  No further follow up needed at this time,  [name] [name], [facility] Desk#: [phone] Cell#: [phone]

## 2019-10-01 ENCOUNTER — Other Ambulatory Visit (HOSPITAL_COMMUNITY): Payer: Self-pay | Admitting: Cardiology

## 2019-10-01 ENCOUNTER — Other Ambulatory Visit (HOSPITAL_COMMUNITY): Payer: Self-pay | Admitting: Adult Health

## 2019-10-01 DIAGNOSIS — M159 Polyosteoarthritis, unspecified: Secondary | ICD-10-CM | POA: Diagnosis not present

## 2019-10-01 DIAGNOSIS — E1151 Type 2 diabetes mellitus with diabetic peripheral angiopathy without gangrene: Secondary | ICD-10-CM | POA: Diagnosis not present

## 2019-10-01 DIAGNOSIS — E1140 Type 2 diabetes mellitus with diabetic neuropathy, unspecified: Secondary | ICD-10-CM | POA: Diagnosis not present

## 2019-10-01 DIAGNOSIS — J449 Chronic obstructive pulmonary disease, unspecified: Secondary | ICD-10-CM | POA: Diagnosis not present

## 2019-10-01 DIAGNOSIS — N1831 Chronic kidney disease, stage 3a: Secondary | ICD-10-CM | POA: Diagnosis not present

## 2019-10-01 DIAGNOSIS — I130 Hypertensive heart and chronic kidney disease with heart failure and stage 1 through stage 4 chronic kidney disease, or unspecified chronic kidney disease: Secondary | ICD-10-CM | POA: Diagnosis not present

## 2019-10-01 DIAGNOSIS — I5020 Unspecified systolic (congestive) heart failure: Secondary | ICD-10-CM | POA: Diagnosis not present

## 2019-10-01 DIAGNOSIS — I2510 Atherosclerotic heart disease of native coronary artery without angina pectoris: Secondary | ICD-10-CM | POA: Diagnosis not present

## 2019-10-01 DIAGNOSIS — M109 Gout, unspecified: Secondary | ICD-10-CM | POA: Diagnosis not present

## 2019-10-01 NOTE — Progress Notes (Signed)
Patient ID: [name] Goldie., male   DOB: 1950-07-30, 69 y.o.   MRN: [id number] PCP: [name] [facility]) Cardiac Surgeon: Dr Roxan Hockey Pulmonologist: Dr. Lamonte Sakai Cardiology: Dr. Aundra Dubin  HPI: [name] [name] is a 69 y.o. with history of COPD, HTN, 3V CAD s/p CABG x 5  (CABG 04/2013: LIMA to LAD, SVG to second diagonal, SVG to ramus intermediate and obtuse marginal 2, SVG to posterior descending), ischemic cardiomyopathy, chronic systolic HF, COPD, DM2, PVD and tobacco abuse. St Jude ICD.   Lexiscan Cardiolite in 12/19 showed inferior infarct, no ischemia.  Echo in 12/19 showed EF 35%, mild LVH, mildly decreased RV systolic function.  ABIs in 12/19 were stable compared to the past.    In the fall of 2020, he was diagnosed with non-small cell lung cancer.  He has been treated with radiation and carboplatin/palclitaxel.  He has lost over 20 lbs.  He was admitted 1/21 at [facility], [place] with ICD shocks.  These were found to be inappropriate shocks probably due to atrial fibrillation.  He is now on amiodarone and Eliquis.    TEE in 3/21 was done to assess mitral regurgitation, showing EF 25-30%, mildly decreased RV systolic function, 3+ likely ischemic MR.   He returns for followup of CHF and CAD. He has quit smoking. Weight is down 2 lbs.  Walks with cane.  Dyspnea if walks fast on flat ground or goes up an incline.  No orthopnea/PND.  No chest pain.  No lightheadedness.  Medication compliance does not seem great.   ECG (personally reviewed): NSR, LAFB, RBBB  St Jude device interrogated: No VT, stable thoracic impedance.   Labs (11/19): K 4.6, creatinine 2.05 Labs(12/19): K 4.3, creatinine 2.07, LDL 57  Labs (9/20): K 4.2, creatinine 2.09, LDL 50 Labs (2/21): TSH normal, K 3.9, creatinine 1.92, AST 56, ALT 176 Labs (3/21): K 3.4, creatinine 1.57, hgb 10.7, LFTs normal    PMH: 1. Chronic systolic CHF: Ischemic cardiomyopathy.  St Jude ICD.  - ECHO 06/26/13 EF 20-25% RV mild HK - ECHO 10/10/13 EF  20-25% restrictive filling pattern. Moderate RV dysfunction. Mild MR. - Cardiolite (10/16) with EF 19%, anterior ischemia.  - Echo 7/17 EF 30-35% - Echo (12/19): EF 35%, mild LVH, basal-mid inferior akinesis, mildly decreased RV systolic function, mild MR.  - TEE (3/21): EF 25-20%, mild LV dilation, mildly decreased RV systolic function, 3+ MR with restricted posterior leaflet, PISA ERO 0.22 cm^2.  2. CAD: CABG x 5 11/14 with LIMA-LAD, SVG-D2, seq SVG-ramus/OM2, SVG-PDA.  - LHC (10/16) with patent LIMA-LAD and 60% pLAD stenosis (competitive flow). SVG-D, sequential SVG-ramus/OM2, SVG-PDA all patent. Medical management.  - Lexiscan Cardiolite in 12/19 showed inferior infarction, no ischemia.  3. COPD: Has not quit smoking.   4. PAD:  - ABIs 11/07/13: RABI .63 moderate arterial occlusive dz,  LABI .78 moderate arterial occlusive dz - ABIs 9/16: RABI .68, LABI 0.75 - ABIs 12/19: right ABI 0.75, left ABI 0.7 5. Type 2 diabetes 6. Hyperlipidemia 7. Osteoarthritis: Left THR in 6/20.  8. Gout  9. Hyperlipidemia 10. CKD: Stage 3.  11. Non-small cell lung cancer: Diagnosed 10/20, treated with carboplatin/paclitaxel + radiation.   12. Atrial fibrillation: Paroxysmal.  13. Mitral regurgitation: Suspect ischemic MR with restricted posterior leaflet.  3+ MR on 3/21 TEE.   SH: Lives with his girlfriend.  Quit smoking in 2020. Does not drink alcohol.   FH: 2 brothers died from heart attacks, Mother DM, Dad HTN   ROS: All systems negative  except as listed in HPI, PMH and Problem List.  Current Outpatient Medications  Medication Sig Dispense Refill  . acetaminophen (TYLENOL) 325 MG tablet Take by mouth every 6 (six) hours as needed for mild pain or moderate pain.    Marland Kitchen albuterol (ACCUNEB) 1.25 MG/3ML nebulizer solution Take 1 ampule by nebulization every 6 (six) hours as needed for wheezing.    Marland Kitchen albuterol (VENTOLIN HFA) 108 (90 Base) MCG/ACT inhaler Inhale 1-2 puffs into the lungs every 6 (six) hours  as needed for wheezing or shortness of breath. 6.7 g 6  . allopurinol (ZYLOPRIM) 300 MG tablet Take 300 mg by mouth daily.    Marland Kitchen amiodarone (PACERONE) 400 MG tablet Take 0.5 tablets (200 mg total) by mouth daily. (Patient taking differently: Take 400 mg by mouth 2 (two) times daily. ) 30 tablet 5  . atorvastatin (LIPITOR) 20 MG tablet Take 20 mg by mouth daily.    Marland Kitchen azelastine (OPTIVAR) 0.05 % ophthalmic solution Place 1 drop into the left eye 2 (two) times daily.   0  . b complex vitamins tablet Take 1 tablet by mouth at bedtime.     . B Complex-C-Folic Acid (RENA-VITE RX) 1 MG TABS Take 1 tablet by mouth daily.    . chlorproMAZINE (THORAZINE) 25 MG tablet Take 1 tablet (25 mg total) by mouth 3 (three) times daily as needed. (Patient taking differently: Take 25 mg by mouth 3 (three) times daily. ) 30 tablet 0  . Cholecalciferol (VITAMIN D3) 25 MCG (1000 UT) CAPS Take 1,000 Units by mouth daily at 12 noon.     Marland Kitchen ELIQUIS 5 MG TABS tablet Take 5 mg by mouth 2 (two) times daily.     . furosemide (LASIX) 40 MG tablet Take 1 tablet (40 mg total) by mouth daily. 30 tablet 5  . guaiFENesin-dextromethorphan (ROBITUSSIN DM) 100-10 MG/5ML syrup Take 5 mLs by mouth every 4 (four) hours as needed for cough.    . Melatonin 3 MG TABS Take 3 mg by mouth at bedtime.    . metoprolol succinate (TOPROL-XL) 50 MG 24 hr tablet Take 50 mg by mouth in the morning and at bedtime.     Marland Kitchen omeprazole (PRILOSEC) 40 MG capsule Take 40 mg by mouth daily.    . potassium chloride SA (KLOR-CON) 20 MEQ tablet Take 1 tablet (20 mEq total) by mouth daily. (Patient taking differently: Take 20 mEq by mouth daily at 12 noon. ) 2 tablet 0  . predniSONE (DELTASONE) 20 MG tablet 4 tablet p.o. daily for 1 week then 3 tablet p.o. daily for 1 week then 2 tablet p.o. daily for 1 week then 1 tablet p.o. daily for 1 week until I see the patient. 70 tablet 0  . promethazine (PHENERGAN) 25 MG tablet Take 25 mg by mouth every 6 (six) hours as needed  for nausea or vomiting.    . senna (SENOKOT) 8.6 MG TABS tablet Take 1 tablet (8.6 mg total) by mouth 2 (two) times daily. (Patient taking differently: Take 1 tablet by mouth at bedtime. ) 120 each 0  . spironolactone (ALDACTONE) 25 MG tablet Take 1 tablet (25 mg total) by mouth at bedtime. 90 tablet 1  . sucralfate (CARAFATE) 1 g tablet Take 1 tablet (1 g total) by mouth 4 (four) times daily -  with meals and at bedtime. 5 min before meals for radiation induced esophagitis (Patient taking differently: Take 1 g by mouth 4 (four) times daily. 5 min before meals for radiation  induced esophagitis) 120 tablet 2  . tiZANidine (ZANAFLEX) 4 MG capsule Take 4 mg by mouth 3 (three) times daily as needed for muscle spasms.     . hydrALAZINE (APRESOLINE) 25 MG tablet TAKE 0.5 TABLETS (12.5 MG TOTAL) BY MOUTH 3 (THREE) TIMES DAILY. 45 tablet 3  . sacubitril-valsartan (ENTRESTO) 24-26 MG Take 1 tablet by mouth 2 (two) times daily. 60 tablet 5   No current facility-administered medications for this encounter.    Vitals:   09/29/19 0952  BP: (!) 122/58  Pulse: 84  SpO2: 99%  Weight: 89.1 kg (196 lb 6.4 oz)    Wt Readings from Last 3 Encounters:  09/29/19 89.1 kg (196 lb 6.4 oz)  09/11/19 91.5 kg (201 lb 11.2 oz)  09/01/19 89.8 kg (198 lb)    PHYSICAL EXAM: General: NAD Neck: No JVD, no thyromegaly or thyroid nodule.  Lungs: Occasional rhonchi.  CV: Nondisplaced PMI.  Heart regular S1/S2, no S3/S4, no murmur.  No peripheral edema.  No carotid bruit.  Normal pedal pulses.  Abdomen: Soft, nontender, no hepatosplenomegaly, no distention.  Skin: Intact without lesions or rashes.  Neurologic: Alert and oriented x 3.  Psych: Normal affect. Extremities: No clubbing or cyanosis.  HEENT: Normal.   ASSESSMENT & PLAN:  1. CAD s/p CABG: Had cath in 10/16 with patent grafts, medical management.  Cardiolite in 12/19 showed no ischemia (just prior infarction).  - Continue statin.  - Stop aspirin given  stable CAD and Eliquis use (still seems to be taking it though he was told to stop in the past).    2. Chronic systolic HF: Ischemic cardiomyopathy, St Jude ICD. Echo 12/19 with EF 35%.  NYHA class III symptoms, he is not volume overloaded on exam or by [place].  Think he is primarily limited by deconditioning and COPD.  - He is on Toprol XL 50 mg daily now instead of Coreg.  - Start back ton Entresto 24/26 bid.  BMET today and in 10 days.   - Continue spironolactone 25 mg daily.  - Can decrease Lasix to 40 mg daily after Delene Loll is started.  3. COPD/smoking: He has now quit smoking.  4. PAD: Stable claudication and stable ABIs in 12/19. He tries to walk through the pain.   - Continue statin.   - Arrange ABIs next appt.  5. CKD: Stage 3.   - BMET today.  6. Atrial fibrillation: Paroxysmal.  He is in NSR today. Had inappropriate ICD shocks in setting of atrial fibrillation with RVR.  - Continue Eliquis.  - Continue amiodarone. Follow LFTs and TSH, will need regular eye exam.  7. Elevated LFTs: Resolved on 3/21 labs.  8. Lung cancer: NSCLC.  Radiation is completed, still get chemotherapy.    See NP/PA in 1 month.  I will try to arrange for paramedicine for him.   Loralie Champagne 10/01/2019

## 2019-10-02 ENCOUNTER — Other Ambulatory Visit (HOSPITAL_COMMUNITY): Payer: Self-pay | Admitting: Cardiology

## 2019-10-02 ENCOUNTER — Telehealth: Payer: Self-pay | Admitting: Medical Oncology

## 2019-10-02 ENCOUNTER — Other Ambulatory Visit (HOSPITAL_COMMUNITY): Payer: Self-pay | Admitting: Adult Health

## 2019-10-02 NOTE — Telephone Encounter (Signed)
Reviewed next appt -with [facility].

## 2019-10-05 DIAGNOSIS — M109 Gout, unspecified: Secondary | ICD-10-CM | POA: Diagnosis not present

## 2019-10-05 DIAGNOSIS — J449 Chronic obstructive pulmonary disease, unspecified: Secondary | ICD-10-CM | POA: Diagnosis not present

## 2019-10-05 DIAGNOSIS — E1151 Type 2 diabetes mellitus with diabetic peripheral angiopathy without gangrene: Secondary | ICD-10-CM | POA: Diagnosis not present

## 2019-10-05 DIAGNOSIS — I5020 Unspecified systolic (congestive) heart failure: Secondary | ICD-10-CM | POA: Diagnosis not present

## 2019-10-05 DIAGNOSIS — M159 Polyosteoarthritis, unspecified: Secondary | ICD-10-CM | POA: Diagnosis not present

## 2019-10-05 DIAGNOSIS — I130 Hypertensive heart and chronic kidney disease with heart failure and stage 1 through stage 4 chronic kidney disease, or unspecified chronic kidney disease: Secondary | ICD-10-CM | POA: Diagnosis not present

## 2019-10-05 DIAGNOSIS — N1831 Chronic kidney disease, stage 3a: Secondary | ICD-10-CM | POA: Diagnosis not present

## 2019-10-05 DIAGNOSIS — I2510 Atherosclerotic heart disease of native coronary artery without angina pectoris: Secondary | ICD-10-CM | POA: Diagnosis not present

## 2019-10-05 DIAGNOSIS — E1140 Type 2 diabetes mellitus with diabetic neuropathy, unspecified: Secondary | ICD-10-CM | POA: Diagnosis not present

## 2019-10-06 ENCOUNTER — Ambulatory Visit (INDEPENDENT_AMBULATORY_CARE_PROVIDER_SITE_OTHER): Payer: Medicare HMO | Admitting: Sports Medicine

## 2019-10-06 ENCOUNTER — Encounter: Payer: Self-pay | Admitting: Sports Medicine

## 2019-10-06 ENCOUNTER — Other Ambulatory Visit: Payer: Self-pay

## 2019-10-06 DIAGNOSIS — M79675 Pain in left toe(s): Secondary | ICD-10-CM | POA: Diagnosis not present

## 2019-10-06 DIAGNOSIS — B351 Tinea unguium: Secondary | ICD-10-CM

## 2019-10-06 DIAGNOSIS — I739 Peripheral vascular disease, unspecified: Secondary | ICD-10-CM | POA: Diagnosis not present

## 2019-10-06 DIAGNOSIS — M79674 Pain in right toe(s): Secondary | ICD-10-CM | POA: Diagnosis not present

## 2019-10-06 DIAGNOSIS — E1140 Type 2 diabetes mellitus with diabetic neuropathy, unspecified: Secondary | ICD-10-CM | POA: Diagnosis not present

## 2019-10-06 DIAGNOSIS — L853 Xerosis cutis: Secondary | ICD-10-CM

## 2019-10-06 NOTE — Progress Notes (Signed)
Patient ID: [name] [name]., male   DOB: 10-05-1950, 69 y.o.   MRN: [id number]  Subjective: [name] [name]. is a 69 y.o. male patient with history of type 2 diabetes who returns to office today complaining of long, painful nails  while ambulating in shoes, unable to trim. Patient states that the glucose reading was recorded and does not remember last A1c.  Last visit to PCP was 1 month ago.  No other problems.   Patient Active Problem List   Diagnosis Date Noted  . Anemia of chronic disease 08/09/2019  . Altered mental status 07/25/2019  . Peripheral neuropathy 07/25/2019  . Dehydration 06/27/2019  . Primary malignant neoplasm of bronchus of left upper lobe ([place]) 05/24/2019  . Stage III squamous cell carcinoma of left lung ([place]) 04/27/2019  . Encounter for antineoplastic chemotherapy 04/27/2019  . Goals of care, counseling/discussion 04/27/2019  . Avascular necrosis of bone of right hip ([place]) 11/30/2018  . Avascular necrosis of hip, right ([place]) 11/30/2018  . Chest pain 04/08/2015  . Abnormal nuclear stress test 04/08/2015  . Ischemic cardiomyopathy 07/06/2014  . Lumbago 03/13/2014  . Special screening for malignant neoplasms, colon 02/09/2014  . Normocytic anemia 02/09/2014  . Pleural effusion on left 11/28/2013  . Atherosclerosis of native arteries of extremity with intermittent claudication ([place]) 11/07/2013  . Weakness of both legs 11/07/2013  . Smoker 09/19/2013  . Numbness in right leg/ Foot 06/14/2013  . Pain in limb-Right Leg/foot 06/14/2013  . Atherosclerosis of native arteries of the extremities with ulceration(440.23) 06/14/2013  . Chronic systolic heart failure ([place]) 05/29/2013  . Atrial fibrillation ([place]) 05/29/2013  . Chest pain, mid sternal 05/19/2013  . S/P CABG x 5 05/08/2013  . Right foot ulcer ([place]) 05/04/2013  . Coronary atherosclerosis of native coronary artery 04/28/2013  . Acute systolic heart failure ([place]) 04/28/2013  . Abnormal blood chemistry 12/30/2012  .  Abnormal LFTs 12/30/2012  . Aspiration pneumonia ([place]) 12/30/2012  . Chronic congestive heart failure ([place]) 12/27/2012  . Leg weakness 12/26/2012  . Pneumonia 05/18/2011  . Gout 05/17/2011  . Respiratory distress, acute 05/17/2011  . Hypertension 05/17/2011  . COPD (chronic obstructive pulmonary disease) ([place]) 05/17/2011  . CHF, acute ([place]) 05/17/2011   Current Outpatient Medications on File Prior to Visit  Medication Sig Dispense Refill  . acetaminophen (TYLENOL) 325 MG tablet Take by mouth every 6 (six) hours as needed for mild pain or moderate pain.    Marland Kitchen albuterol (ACCUNEB) 1.25 MG/3ML nebulizer solution Take 1 ampule by nebulization every 6 (six) hours as needed for wheezing.    Marland Kitchen albuterol (VENTOLIN HFA) 108 (90 Base) MCG/ACT inhaler Inhale 1-2 puffs into the lungs every 6 (six) hours as needed for wheezing or shortness of breath. 6.7 g 6  . allopurinol (ZYLOPRIM) 300 MG tablet Take 300 mg by mouth daily.    Marland Kitchen amiodarone (PACERONE) 400 MG tablet Take 0.5 tablets (200 mg total) by mouth daily. (Patient taking differently: Take 400 mg by mouth 2 (two) times daily. ) 30 tablet 5  . atorvastatin (LIPITOR) 20 MG tablet Take 20 mg by mouth daily.    Marland Kitchen azelastine (OPTIVAR) 0.05 % ophthalmic solution Place 1 drop into the left eye 2 (two) times daily.   0  . b complex vitamins tablet Take 1 tablet by mouth at bedtime.     . B Complex-C-Folic Acid (RENA-VITE RX) 1 MG TABS Take 1 tablet by mouth daily.    . chlorproMAZINE (THORAZINE) 25 MG tablet Take 1 tablet (  25 mg total) by mouth 3 (three) times daily as needed. (Patient taking differently: Take 25 mg by mouth 3 (three) times daily. ) 30 tablet 0  . Cholecalciferol (VITAMIN D3) 25 MCG (1000 UT) CAPS Take 1,000 Units by mouth daily at 12 noon.     Marland Kitchen ELIQUIS 5 MG TABS tablet Take 5 mg by mouth 2 (two) times daily.     . furosemide (LASIX) 40 MG tablet Take 1 tablet (40 mg total) by mouth daily. 30 tablet 5  . guaiFENesin-dextromethorphan  (ROBITUSSIN DM) 100-10 MG/5ML syrup Take 5 mLs by mouth every 4 (four) hours as needed for cough.    . hydrALAZINE (APRESOLINE) 25 MG tablet Take 0.5 tablets (12.5 mg total) by mouth 3 (three) times daily. 135 tablet 3  . Melatonin 3 MG TABS Take 3 mg by mouth at bedtime.    . metoprolol succinate (TOPROL-XL) 50 MG 24 hr tablet Take 50 mg by mouth in the morning and at bedtime.     Marland Kitchen omeprazole (PRILOSEC) 40 MG capsule Take 40 mg by mouth daily.    . potassium chloride SA (KLOR-CON) 20 MEQ tablet Take 1 tablet (20 mEq total) by mouth daily. (Patient taking differently: Take 20 mEq by mouth daily at 12 noon. ) 2 tablet 0  . predniSONE (DELTASONE) 20 MG tablet 4 tablet p.o. daily for 1 week then 3 tablet p.o. daily for 1 week then 2 tablet p.o. daily for 1 week then 1 tablet p.o. daily for 1 week until I see the patient. 70 tablet 0  . promethazine (PHENERGAN) 25 MG tablet Take 25 mg by mouth every 6 (six) hours as needed for nausea or vomiting.    . sacubitril-valsartan (ENTRESTO) 24-26 MG Take 1 tablet by mouth 2 (two) times daily. 60 tablet 5  . senna (SENOKOT) 8.6 MG TABS tablet Take 1 tablet (8.6 mg total) by mouth 2 (two) times daily. (Patient taking differently: Take 1 tablet by mouth at bedtime. ) 120 each 0  . spironolactone (ALDACTONE) 25 MG tablet Take 1 tablet (25 mg total) by mouth at bedtime. 90 tablet 1  . sucralfate (CARAFATE) 1 g tablet Take 1 tablet (1 g total) by mouth 4 (four) times daily -  with meals and at bedtime. 5 min before meals for radiation induced esophagitis (Patient taking differently: Take 1 g by mouth 4 (four) times daily. 5 min before meals for radiation induced esophagitis) 120 tablet 2  . tiZANidine (ZANAFLEX) 4 MG capsule Take 4 mg by mouth 3 (three) times daily as needed for muscle spasms.      No current facility-administered medications on file prior to visit.   Allergies  Allergen Reactions  . Penicillins Nausea And Vomiting, Swelling and Other (See  Comments)    Per Dr Audria Nine Via phone 12/27/12 0400 SWELLING REACTION UNSPECIFIED   Did it involve swelling of the face/tongue/throat, SOB, or low BP? Unknown Did it involve sudden or severe rash/hives, skin peeling, or any reaction on the inside of your mouth or nose? Unknown Did you need to seek medical attention at a hospital or doctor's office? Unknown When did it last happen?Childhood If all above answers are "NO", may proceed with cephalosporin use.   Objective: General: Patient is awake, alert, and oriented x 3 and in no acute distress.  Integument: Skin is warm, dry and supple bilateral. Continued  xerosis bilateral, slowly improving like before since patient had a home aide who has been helping him apply daily skin  emollients. Nails are tender, long, thickened and dystrophic with subungual debris, consistent with onychomycosis, 1-5 bilateral. No signs of infection.  Minimal callus to 1st toes.  Remaining integument unremarkable.  Vasculature:  Dorsalis Pedis pulse 1/4 bilateral. Posterior Tibial pulse  0/4 bilateral.  Capillary fill time <3 sec 1-5 bilateral. Scant hair growth to the level of the digits. Temperature gradient within normal limits. No varicosities present bilateral. Mild trace edema present bilateral ankles.   Neurology: The patient has diminished sensation measured with a 5.07/10g Semmes Weinstein Monofilament at all pedal sites bilateral . Vibratory sensation diminished bilateral with tuning fork. No Babinski sign present bilateral.   Musculoskeletal: Asymptomatioc pes planus and bunion pedal deformities noted bilateral. Muscular strength 5/5 in all lower extremity muscular groups bilateral without pain or limitation on range of motion. No tenderness with calf compression bilateral.  Assessment and Plan: Problem List Items Addressed This Visit    None    Visit Diagnoses    Pain due to onychomycosis of toenails of both feet    -  Primary   Type 2  diabetes, controlled, with neuropathy (HCC)       Peripheral vascular disease ([place])       Xerosis of skin         -Patient seen and evaluated -Re-Discussed and educated patient on diabetic foot care, especially with regards to the vascular, neurological and musculoskeletal systems.  -Mechanically debrided all nails 1-5 bilateral using sterile nail nipper and filed with dremel without incident  -Continue with daily skin emollients for dry skin -Return in 3 months for at risk foot care -Patient advised to call the office if any problems or questions arise in the meantime.  Landis Martins, DPM

## 2019-10-10 ENCOUNTER — Other Ambulatory Visit: Payer: Self-pay

## 2019-10-10 ENCOUNTER — Ambulatory Visit (HOSPITAL_COMMUNITY)
Admission: RE | Admit: 2019-10-10 | Discharge: 2019-10-10 | Disposition: A | Discharge status: Home / Self Care | Payer: Medicare HMO | Source: Ambulatory Visit | Attending: Internal Medicine | Admitting: Internal Medicine

## 2019-10-10 DIAGNOSIS — I255 Ischemic cardiomyopathy: Secondary | ICD-10-CM | POA: Diagnosis not present

## 2019-10-10 LAB — BASIC METABOLIC PANEL
Anion gap: 8 (ref 5–15)
BUN: 16 mg/dL (ref 8–23)
CO2: 23 mmol/L (ref 22–32)
Calcium: 9.5 mg/dL (ref 8.9–10.3)
Chloride: 107 mmol/L (ref 98–111)
Creatinine, Ser: 1.79 mg/dL — ABNORMAL HIGH (ref 0.61–1.24)
GFR calc Af Amer: 44 mL/min — ABNORMAL LOW (ref >60)
GFR calc non Af Amer: 38 mL/min — ABNORMAL LOW (ref >60)
Glucose, Bld: 83 mg/dL (ref 70–99)
Potassium: 3.9 mmol/L (ref 3.5–5.1)
Sodium: 138 mmol/L (ref 135–145)

## 2019-10-18 ENCOUNTER — Other Ambulatory Visit (HOSPITAL_COMMUNITY): Payer: Self-pay | Admitting: Cardiology

## 2019-10-18 ENCOUNTER — Other Ambulatory Visit (HOSPITAL_COMMUNITY): Payer: Self-pay | Admitting: Adult Health

## 2019-10-19 DIAGNOSIS — E1140 Type 2 diabetes mellitus with diabetic neuropathy, unspecified: Secondary | ICD-10-CM | POA: Diagnosis not present

## 2019-10-19 DIAGNOSIS — M109 Gout, unspecified: Secondary | ICD-10-CM | POA: Diagnosis not present

## 2019-10-19 DIAGNOSIS — I5020 Unspecified systolic (congestive) heart failure: Secondary | ICD-10-CM | POA: Diagnosis not present

## 2019-10-19 DIAGNOSIS — M159 Polyosteoarthritis, unspecified: Secondary | ICD-10-CM | POA: Diagnosis not present

## 2019-10-19 DIAGNOSIS — I130 Hypertensive heart and chronic kidney disease with heart failure and stage 1 through stage 4 chronic kidney disease, or unspecified chronic kidney disease: Secondary | ICD-10-CM | POA: Diagnosis not present

## 2019-10-19 DIAGNOSIS — I2510 Atherosclerotic heart disease of native coronary artery without angina pectoris: Secondary | ICD-10-CM | POA: Diagnosis not present

## 2019-10-19 DIAGNOSIS — N1831 Chronic kidney disease, stage 3a: Secondary | ICD-10-CM | POA: Diagnosis not present

## 2019-10-19 DIAGNOSIS — E1151 Type 2 diabetes mellitus with diabetic peripheral angiopathy without gangrene: Secondary | ICD-10-CM | POA: Diagnosis not present

## 2019-10-19 DIAGNOSIS — J449 Chronic obstructive pulmonary disease, unspecified: Secondary | ICD-10-CM | POA: Diagnosis not present

## 2019-10-24 ENCOUNTER — Inpatient Hospital Stay: Payer: Medicare HMO | Attending: Internal Medicine | Admitting: Internal Medicine

## 2019-10-24 ENCOUNTER — Other Ambulatory Visit: Payer: Self-pay

## 2019-10-24 ENCOUNTER — Telehealth (HOSPITAL_COMMUNITY): Payer: Self-pay

## 2019-10-24 VITALS — BP 112/65 | HR 89 | Temp 98.2°F | Resp 18 | Ht 67.0 in | Wt 198.6 lb

## 2019-10-24 DIAGNOSIS — Z7952 Long term (current) use of systemic steroids: Secondary | ICD-10-CM | POA: Diagnosis not present

## 2019-10-24 DIAGNOSIS — C3412 Malignant neoplasm of upper lobe, left bronchus or lung: Secondary | ICD-10-CM | POA: Insufficient documentation

## 2019-10-24 DIAGNOSIS — R4182 Altered mental status, unspecified: Secondary | ICD-10-CM

## 2019-10-24 DIAGNOSIS — G63 Polyneuropathy in diseases classified elsewhere: Secondary | ICD-10-CM

## 2019-10-24 DIAGNOSIS — I2510 Atherosclerotic heart disease of native coronary artery without angina pectoris: Secondary | ICD-10-CM | POA: Diagnosis not present

## 2019-10-24 DIAGNOSIS — Z8249 Family history of ischemic heart disease and other diseases of the circulatory system: Secondary | ICD-10-CM | POA: Diagnosis not present

## 2019-10-24 DIAGNOSIS — Z87891 Personal history of nicotine dependence: Secondary | ICD-10-CM | POA: Diagnosis not present

## 2019-10-24 DIAGNOSIS — Z9221 Personal history of antineoplastic chemotherapy: Secondary | ICD-10-CM | POA: Insufficient documentation

## 2019-10-24 DIAGNOSIS — Z79899 Other long term (current) drug therapy: Secondary | ICD-10-CM | POA: Insufficient documentation

## 2019-10-24 DIAGNOSIS — E785 Hyperlipidemia, unspecified: Secondary | ICD-10-CM | POA: Diagnosis not present

## 2019-10-24 DIAGNOSIS — I255 Ischemic cardiomyopathy: Secondary | ICD-10-CM | POA: Insufficient documentation

## 2019-10-24 DIAGNOSIS — E119 Type 2 diabetes mellitus without complications: Secondary | ICD-10-CM | POA: Diagnosis not present

## 2019-10-24 DIAGNOSIS — G629 Polyneuropathy, unspecified: Secondary | ICD-10-CM | POA: Diagnosis not present

## 2019-10-24 DIAGNOSIS — J449 Chronic obstructive pulmonary disease, unspecified: Secondary | ICD-10-CM | POA: Insufficient documentation

## 2019-10-24 DIAGNOSIS — I5022 Chronic systolic (congestive) heart failure: Secondary | ICD-10-CM | POA: Insufficient documentation

## 2019-10-24 DIAGNOSIS — Z7901 Long term (current) use of anticoagulants: Secondary | ICD-10-CM | POA: Insufficient documentation

## 2019-10-24 DIAGNOSIS — Z833 Family history of diabetes mellitus: Secondary | ICD-10-CM | POA: Insufficient documentation

## 2019-10-24 DIAGNOSIS — I110 Hypertensive heart disease with heart failure: Secondary | ICD-10-CM | POA: Insufficient documentation

## 2019-10-24 NOTE — Progress Notes (Signed)
[facility] [place], [place] [address] [phone]   Interval Evaluation  Date of Service: 10/24/19 Patient Name: [name] [name]. Patient MRN: [id number] Patient DOB: 1950-11-23 Provider: Ventura Sellers, MD  Identifying Statement:  [name] Caseres. is a 69 y.o. male with encephalopathy who presents for evaluation regarding cancer associated neurologic deficits.    Primary Cancer:  Oncologic History: Oncology History  Stage III squamous cell carcinoma of left lung ([place])  04/27/2019 Initial Diagnosis   Stage III squamous cell carcinoma of left lung ([place])   05/08/2019 -  Chemotherapy   The patient had palonosetron (ALOXI) injection 0.25 mg, 0.25 mg, Intravenous,  Once, 7 of 8 cycles Administration: 0.25 mg (05/08/2019), 0.25 mg (06/05/2019), 0.25 mg (06/12/2019), 0.25 mg (05/16/2019), 0.25 mg (06/19/2019), 0.25 mg (05/22/2019), 0.25 mg (05/29/2019) CARBOplatin (PARAPLATIN) 160 mg in sodium chloride 0.9 % 250 mL chemo infusion, 160 mg (100 % of original dose 162.6 mg), Intravenous,  Once, 7 of 8 cycles Dose modification: 162.6 mg (original dose 162.6 mg, Cycle 1), 181.8 mg (original dose 162.6 mg, Cycle 6, Reason: Provider Judgment) Administration: 160 mg (05/08/2019), 160 mg (06/05/2019), 160 mg (06/12/2019), 160 mg (05/16/2019), 160 mg (06/19/2019), 160 mg (05/22/2019), 160 mg (05/29/2019) PACLitaxel (TAXOL) 96 mg in sodium chloride 0.9 % 250 mL chemo infusion (</= 80mg /m2), 45 mg/m2 = 96 mg, Intravenous,  Once, 7 of 8 cycles Administration: 96 mg (05/08/2019), 96 mg (06/05/2019), 96 mg (06/12/2019), 96 mg (05/16/2019), 96 mg (06/19/2019), 96 mg (05/22/2019), 96 mg (05/29/2019)  for chemotherapy treatment.      Interval History  [name] [name]. presents today for follow up.  He reports no new or progressive neurologic changes.  No bouts of confusion. This was confirmed by his daughter.  His neuropathy is still "there" but not  progressing and not causing significant disturbance.  Denies headaches or seizures.  H+P (07/25/19) Patient presents today to discuss recent clinical changes.  Although he describes no change in mentation himself, he does acknowledge that his sister (not present today) did feel he became more confused and disorganized for a period earlier this month.  This culminated with an admission at [facility] several weeks ago, although those records are not available to [place] here.  He describes living independently, but relying on family for help with paying bills and transportation.  He is able to cook for himself.  He does describe an increase in fatigue and lethargy in recent months.  Ambulation is with a walker because of imbalance and "trouble feeling feet under me".  He completed week carbo+taxol and lung RT ~1 month ago.  Medications: Current Outpatient Medications on File Prior to Visit  Medication Sig Dispense Refill  . acetaminophen (TYLENOL) 325 MG tablet Take by mouth every 6 (six) hours as needed for mild pain or moderate pain.    Marland Kitchen albuterol (ACCUNEB) 1.25 MG/3ML nebulizer solution Take 1 ampule by nebulization every 6 (six) hours as needed for wheezing.    Marland Kitchen albuterol (VENTOLIN HFA) 108 (90 Base) MCG/ACT inhaler Inhale 1-2 puffs into the lungs every 6 (six) hours as needed for wheezing or shortness of breath. 6.7 g 6  . allopurinol (ZYLOPRIM) 300 MG tablet Take 300 mg by mouth daily.    Marland Kitchen amiodarone (PACERONE) 400 MG tablet Take 0.5 tablets (200 mg total) by mouth daily. (Patient taking differently: Take 400 mg by mouth 2 (two) times daily. ) 30 tablet 5  . atorvastatin (LIPITOR)  20 MG tablet Take 20 mg by mouth daily.    Marland Kitchen azelastine (OPTIVAR) 0.05 % ophthalmic solution Place 1 drop into the left eye 2 (two) times daily.   0  . b complex vitamins tablet Take 1 tablet by mouth at bedtime.     . B Complex-C-Folic Acid (RENA-VITE RX) 1 MG TABS Take 1 tablet by mouth daily.    . chlorproMAZINE  (THORAZINE) 25 MG tablet Take 1 tablet (25 mg total) by mouth 3 (three) times daily as needed. (Patient taking differently: Take 25 mg by mouth 3 (three) times daily. ) 30 tablet 0  . Cholecalciferol (VITAMIN D3) 25 MCG (1000 UT) CAPS Take 1,000 Units by mouth daily at 12 noon.     Marland Kitchen ELIQUIS 5 MG TABS tablet Take 5 mg by mouth 2 (two) times daily.     . furosemide (LASIX) 40 MG tablet Take 1 tablet (40 mg total) by mouth daily. 30 tablet 5  . guaiFENesin-dextromethorphan (ROBITUSSIN DM) 100-10 MG/5ML syrup Take 5 mLs by mouth every 4 (four) hours as needed for cough.    . hydrALAZINE (APRESOLINE) 25 MG tablet Take 0.5 tablets (12.5 mg total) by mouth 3 (three) times daily. 135 tablet 3  . Melatonin 3 MG TABS Take 3 mg by mouth at bedtime.    . metoprolol succinate (TOPROL-XL) 50 MG 24 hr tablet Take 50 mg by mouth in the morning and at bedtime.     Marland Kitchen omeprazole (PRILOSEC) 40 MG capsule Take 40 mg by mouth daily.    . potassium chloride SA (KLOR-CON) 20 MEQ tablet Take 1 tablet (20 mEq total) by mouth daily. (Patient taking differently: Take 20 mEq by mouth daily at 12 noon. ) 2 tablet 0  . predniSONE (DELTASONE) 20 MG tablet 4 tablet p.o. daily for 1 week then 3 tablet p.o. daily for 1 week then 2 tablet p.o. daily for 1 week then 1 tablet p.o. daily for 1 week until I see the patient. 70 tablet 0  . promethazine (PHENERGAN) 25 MG tablet Take 25 mg by mouth every 6 (six) hours as needed for nausea or vomiting.    . sacubitril-valsartan (ENTRESTO) 24-26 MG Take 1 tablet by mouth 2 (two) times daily. 60 tablet 5  . senna (SENOKOT) 8.6 MG TABS tablet Take 1 tablet (8.6 mg total) by mouth 2 (two) times daily. (Patient taking differently: Take 1 tablet by mouth at bedtime. ) 120 each 0  . spironolactone (ALDACTONE) 25 MG tablet Take 1 tablet (25 mg total) by mouth at bedtime. 90 tablet 1  . sucralfate (CARAFATE) 1 g tablet Take 1 tablet (1 g total) by mouth 4 (four) times daily -  with meals and at  bedtime. 5 min before meals for radiation induced esophagitis (Patient taking differently: Take 1 g by mouth 4 (four) times daily. 5 min before meals for radiation induced esophagitis) 120 tablet 2  . tiZANidine (ZANAFLEX) 4 MG capsule Take 4 mg by mouth 3 (three) times daily as needed for muscle spasms.      No current facility-administered medications on file prior to visit.    Allergies:  Allergies  Allergen Reactions  . Penicillins Nausea And Vomiting, Swelling and Other (See Comments)    Per Dr Audria Nine Via phone 12/27/12 0400 SWELLING REACTION UNSPECIFIED   Did it involve swelling of the face/tongue/throat, SOB, or low BP? Unknown Did it involve sudden or severe rash/hives, skin peeling, or any reaction on the inside of your mouth or  nose? Unknown Did you need to seek medical attention at a hospital or doctor's office? Unknown When did it last happen?Childhood If all above answers are "NO", may proceed with cephalosporin use.   Past Medical History:  Past Medical History:  Diagnosis Date  . AICD (automatic cardioverter/defibrillator) present    reports no shocks  - st Jude  . Ambulates with cane   . CAD (coronary artery disease)    a. 04/2013 Cath: LM 10, LAD 40p, 61m, D1 50p, LCX 60p, 25m/100m, OM1 50, OM2 100 L-L collats, RCA 100p;  b. CABG x 5: LIMA->LAD, VG->D2, VG->RI->OM2, VG->PDA.  . Cataracts, bilateral   . Chronic systolic CHF (congestive heart failure) ([place])    a. 04/2013 Echo: EF 15-20%, diff HK, sev HK of inf and ant myocardium, dilated LA,  b. EF 20-25% with restrictive filling pattern, mod RV dysfx, mild MR (10/10/2013);  c.  Echo 12/15: EF 25-30%  . COPD (chronic obstructive pulmonary disease) ([place])   . Diabetes mellitus without complication ([place])    unsure what type but was dx as an adult, takes no meds, check his cbg at home 2 times a week   . Emphysema   . GERD (gastroesophageal reflux disease)   . Gout   . Hyperlipidemia   . Hypertension   .  Ischemic cardiomyopathy   . lung ca   . PVD (peripheral vascular disease) ([place])    a. (10/2013) ABIs: RIGHT 0.63, Waveforms: monophasic;  LEFT 0.78, Waveforms: monophasic  . Tobacco abuse    30+ pack-year history  . Transaminitis    a. 04/2013 Abd U/S and CT unremarkable, hepatitis panel neg -->felt to be 2/2 acute R heart failure.   Past Surgical History:  Past Surgical History:  Procedure Laterality Date  . APPENDECTOMY  as a child  . CARDIAC CATHETERIZATION  04/08/2015   Procedure: Left Heart Cath and Cors/Grafts Angiography;  Surgeon: Peter M Martinique, MD;  Location: [facility] CV [facility];  Service: Cardiovascular;;  . COLONOSCOPY    . CORONARY ARTERY BYPASS GRAFT N/A 05/05/2013   Procedure: CORONARY ARTERY BYPASS GRAFTING (CABG) TIMES FIVE USING LEFT INTERNAL MAMMARY ARTERY AND RIGHT AND LEFT SAPHENOUS LEG VEIN HARVESTED ENDOSCOPICALLY;  Surgeon: Melrose Nakayama, MD;  Location: [facility];  Service: Open Heart Surgery;  Laterality: N/A;  . IMPLANTABLE CARDIOVERTER DEFIBRILLATOR IMPLANT N/A 07/06/2014   Procedure: St Jude IMPLANTABLE CARDIOVERTER DEFIBRILLATOR IMPLANT;  Surgeon: Deboraha Sprang, MD;  Location: [facility] CATH LAB;  Service: Cardiovascular;  Laterality: N/A;  . LEFT AND RIGHT HEART CATHETERIZATION WITH CORONARY ANGIOGRAM N/A 04/28/2013   Procedure: LEFT AND RIGHT HEART CATHETERIZATION WITH CORONARY ANGIOGRAM;  Surgeon: Burnell Blanks, MD;  Location: [facility] CATH LAB;  Service: Cardiovascular;  Laterality: N/A;  . MULTIPLE EXTRACTIONS WITH ALVEOLOPLASTY N/A 05/03/2013   Procedure: Extraction of tooth #s 3,4,5,6,8,9,27 with alveoloplast and maxillary left osseous tuberosity reduction;  Surgeon: Lenn Cal, DDS;  Location: [facility];  Service: Oral Surgery;  Laterality: N/A;  . TEE WITHOUT CARDIOVERSION N/A 09/01/2019   Procedure: TRANSESOPHAGEAL ECHOCARDIOGRAM (TEE);  Surgeon: Larey Dresser, MD;  Location: [facility] ENDOSCOPY;  Service: Cardiovascular;  Laterality: N/A;  . TOTAL HIP  ARTHROPLASTY Right 11/30/2018   Procedure: TOTAL HIP ARTHROPLASTY ANTERIOR APPROACH;  Surgeon: Rod Can, MD;  Location: [facility];  Service: Orthopedics;  Laterality: Right;  Marland Kitchen VIDEO BRONCHOSCOPY WITH ENDOBRONCHIAL ULTRASOUND N/A 04/20/2019   Procedure: VIDEO BRONCHOSCOPY WITH ENDOBRONCHIAL ULTRASOUND;  Surgeon: Margaretha Seeds, MD;  Location: [facility];  Service: Thoracic;  Laterality: N/A;   Social History:  Social History   Socioeconomic History  . Marital status: Single    Spouse name: Not on file  . Number of children: Not on file  . Years of education: Not on file  . Highest education level: Not on file  Occupational History  . Occupation: Retired  Tobacco Use  . Smoking status: Former Smoker    Packs/day: 2.00    Years: 30.00    Pack years: 60.00    Types: Cigarettes    Start date: 1969    Quit date: 03/23/2019    Years since quitting: 0.5  . Smokeless tobacco: Never Used  . Tobacco comment: Pt states now smokes - half pack per day 04/05/19  Substance and Sexual Activity  . Alcohol use: Not Currently  . Drug use: Yes    Types: Marijuana    Comment: last use 04/17/19  . Sexual activity: Not on file  Other Topics Concern  . Not on file  Social History Narrative   Lives in [place] with his girlfriend and her daughter.     Social Determinants of Health   Financial Resource Strain: Low Risk   . Difficulty of Paying Living Expenses: Not very hard  Food Insecurity: No Food Insecurity  . Worried About Charity fundraiser in the Last Year: Never true  . Ran Out of Food in the Last Year: Never true  Transportation Needs: No Transportation Needs  . Lack of Transportation (Medical): No  . Lack of Transportation (Non-Medical): No  Physical Activity:   . Days of Exercise per Week:   . Minutes of Exercise per Session:   Stress:   . Feeling of Stress :   Social Connections:   . Frequency of Communication with Friends and Family:   . Frequency of Social Gatherings with  Friends and Family:   . Attends Religious Services:   . Active Member of Clubs or Organizations:   . Attends Archivist Meetings:   Marland Kitchen Marital Status:   Intimate Partner Violence:   . Fear of Current or Ex-Partner:   . Emotionally Abused:   Marland Kitchen Physically Abused:   . Sexually Abused:    Family History:  Family History  Problem Relation Age of Onset  . Diabetes Mother   . Hypertension Father   . Hypertension Other   . Heart attack Other        Brothers  . Diabetes Brother   . Heart attack Brother   . Heart attack Brother   . Stroke Neg Hx     Review of Systems: Constitutional: Doesn't report fevers, chills or abnormal weight loss Eyes: Doesn't report blurriness of vision Ears, nose, mouth, throat, and face: Doesn't report sore throat Respiratory: Doesn't report cough, dyspnea or wheezes Cardiovascular: Doesn't report palpitation, chest discomfort  Gastrointestinal:  Doesn't report nausea, constipation, diarrhea GU: Doesn't report incontinence Skin: Doesn't report skin rashes Neurological: Per HPI Musculoskeletal: Doesn't report joint pain Behavioral/Psych: Doesn't report anxiety  Physical Exam: Vitals:   10/24/19 1031  BP: 112/65  Pulse: 89  Resp: 18  Temp: 98.2 F (36.8 C)  SpO2: 100%   KPS: 70. General: Alert, cooperative, pleasant, in no acute distress Head: Normal EENT: No conjunctival injection or scleral icterus.  Lungs: Resp effort normal Cardiac: Regular rate Abdomen: Non-distended abdomen Skin: No rashes cyanosis or petechiae. Extremities: No clubbing or edema  Neurologic Exam: Mental Status: Awake, alert, attentive to examiner. Oriented to self and environment. Language is fluent with  intact comprehension. Age advanced psychomotor slowing.  Cranial Nerves: Visual acuity is grossly normal. Visual fields are full. Extra-ocular movements intact. No ptosis. Face is symmetric Motor: Tone and bulk are normal. Power is full in both arms and legs.  Reflexes are symmetric, no pathologic reflexes present.  Sensory: Stocking loss of pain/temp to above ankles Gait: Sensory dystaxia   Labs: I have reviewed the data as listed    Component Value Date/Time   NA 138 10/10/2019 1011   NA 140 08/23/2019 1020   K 3.9 10/10/2019 1011   CL 107 10/10/2019 1011   CO2 23 10/10/2019 1011   GLUCOSE 83 10/10/2019 1011   BUN 16 10/10/2019 1011   BUN 52 (H) 08/23/2019 1020   CREATININE 1.79 (H) 10/10/2019 1011   CREATININE 1.57 (H) 09/08/2019 1148   CREATININE 1.35 (H) 12/25/2015 1310   CALCIUM 9.5 10/10/2019 1011   PROT 6.0 (L) 09/08/2019 1148   PROT 5.7 (L) 07/26/2019 1044   ALBUMIN 3.1 (L) 09/08/2019 1148   ALBUMIN 3.2 (L) 07/26/2019 1044   AST 21 09/08/2019 1148   ALT 39 09/08/2019 1148   ALKPHOS 75 09/08/2019 1148   BILITOT 0.9 09/08/2019 1148   GFRNONAA 38 (L) 10/10/2019 1011   GFRNONAA 45 (L) 09/08/2019 1148   GFRAA 44 (L) 10/10/2019 1011   GFRAA 52 (L) 09/08/2019 1148   Lab Results  Component Value Date   WBC 10.7 (H) 09/08/2019   NEUTROABS 9.5 (H) 09/08/2019   HGB 10.7 (L) 09/08/2019   HCT 34.6 (L) 09/08/2019   MCV 98.9 09/08/2019   PLT 93 (L) 09/08/2019    Assessment/Plan  Cognitive Decline Peripheral Neuropathy with Gait Impairment  Mr. Aiello is clinically stable today.   Etiology for cognitive changes from January was multifactorial. Baseline congestive heart failure, chronic kidney disease, bone marrow toxicity from chemotherapy, and chronic ischemic white matter change visible on CT head all can contribute to drop off in short term memory and processing speed.  Recent course of chemotherapy likely acted as exacerbating agent.    Peripheral neuropathy is length dependent, symmetric and affecting small and large fibers.  Likely etiology is diabetes and platinum based chemotherapy exposure.  This is affecting the independence of his gait.  We appreciate the opportunity to participate in the care of [name] [name]..    All questions were answered. The patient knows to call the clinic with any problems, questions or concerns. No barriers to learning were detected.  The total time spent in the encounter was 30 minutes and more than 50% was on counseling and review of test results   Ventura Sellers, MD Medical Director of Neuro-Oncology [facility] [place] 10/24/19 10:36 AM

## 2019-10-24 NOTE — Telephone Encounter (Signed)
Received a fax from [facility] to make Dr. Aundra Dubin aware of a drug to drug interaction to sign off on. MD signed off and form was faxed back to [phone]. Received a fax conformation that the from was delivered successfully. Form will be scanned into patients chart

## 2019-10-28 ENCOUNTER — Other Ambulatory Visit (HOSPITAL_COMMUNITY): Payer: Self-pay | Admitting: Cardiology

## 2019-10-30 ENCOUNTER — Encounter (HOSPITAL_COMMUNITY): Payer: Self-pay

## 2019-10-30 ENCOUNTER — Other Ambulatory Visit: Payer: Self-pay

## 2019-10-30 ENCOUNTER — Ambulatory Visit (HOSPITAL_COMMUNITY)
Admission: RE | Admit: 2019-10-30 | Discharge: 2019-10-30 | Disposition: A | Discharge status: Home / Self Care | Payer: Medicare HMO | Source: Ambulatory Visit | Attending: Cardiology | Admitting: Cardiology

## 2019-10-30 VITALS — BP 116/64 | HR 95 | Wt 201.4 lb

## 2019-10-30 DIAGNOSIS — I130 Hypertensive heart and chronic kidney disease with heart failure and stage 1 through stage 4 chronic kidney disease, or unspecified chronic kidney disease: Secondary | ICD-10-CM | POA: Diagnosis not present

## 2019-10-30 DIAGNOSIS — E1122 Type 2 diabetes mellitus with diabetic chronic kidney disease: Secondary | ICD-10-CM | POA: Diagnosis not present

## 2019-10-30 DIAGNOSIS — Z833 Family history of diabetes mellitus: Secondary | ICD-10-CM | POA: Insufficient documentation

## 2019-10-30 DIAGNOSIS — Z95810 Presence of automatic (implantable) cardiac defibrillator: Secondary | ICD-10-CM | POA: Diagnosis not present

## 2019-10-30 DIAGNOSIS — Z87891 Personal history of nicotine dependence: Secondary | ICD-10-CM | POA: Insufficient documentation

## 2019-10-30 DIAGNOSIS — J449 Chronic obstructive pulmonary disease, unspecified: Secondary | ICD-10-CM | POA: Insufficient documentation

## 2019-10-30 DIAGNOSIS — N1831 Chronic kidney disease, stage 3a: Secondary | ICD-10-CM | POA: Diagnosis not present

## 2019-10-30 DIAGNOSIS — Z951 Presence of aortocoronary bypass graft: Secondary | ICD-10-CM | POA: Insufficient documentation

## 2019-10-30 DIAGNOSIS — M109 Gout, unspecified: Secondary | ICD-10-CM | POA: Insufficient documentation

## 2019-10-30 DIAGNOSIS — C3490 Malignant neoplasm of unspecified part of unspecified bronchus or lung: Secondary | ICD-10-CM | POA: Insufficient documentation

## 2019-10-30 DIAGNOSIS — I2510 Atherosclerotic heart disease of native coronary artery without angina pectoris: Secondary | ICD-10-CM | POA: Diagnosis not present

## 2019-10-30 DIAGNOSIS — Z79899 Other long term (current) drug therapy: Secondary | ICD-10-CM | POA: Diagnosis not present

## 2019-10-30 DIAGNOSIS — Z96642 Presence of left artificial hip joint: Secondary | ICD-10-CM | POA: Diagnosis not present

## 2019-10-30 DIAGNOSIS — I255 Ischemic cardiomyopathy: Secondary | ICD-10-CM | POA: Insufficient documentation

## 2019-10-30 DIAGNOSIS — E785 Hyperlipidemia, unspecified: Secondary | ICD-10-CM | POA: Diagnosis not present

## 2019-10-30 DIAGNOSIS — E1151 Type 2 diabetes mellitus with diabetic peripheral angiopathy without gangrene: Secondary | ICD-10-CM | POA: Insufficient documentation

## 2019-10-30 DIAGNOSIS — I480 Paroxysmal atrial fibrillation: Secondary | ICD-10-CM | POA: Insufficient documentation

## 2019-10-30 DIAGNOSIS — I5022 Chronic systolic (congestive) heart failure: Secondary | ICD-10-CM | POA: Insufficient documentation

## 2019-10-30 DIAGNOSIS — N183 Chronic kidney disease, stage 3 unspecified: Secondary | ICD-10-CM | POA: Insufficient documentation

## 2019-10-30 DIAGNOSIS — I739 Peripheral vascular disease, unspecified: Secondary | ICD-10-CM | POA: Diagnosis not present

## 2019-10-30 DIAGNOSIS — Z8249 Family history of ischemic heart disease and other diseases of the circulatory system: Secondary | ICD-10-CM | POA: Diagnosis not present

## 2019-10-30 DIAGNOSIS — E1140 Type 2 diabetes mellitus with diabetic neuropathy, unspecified: Secondary | ICD-10-CM | POA: Diagnosis not present

## 2019-10-30 DIAGNOSIS — Z923 Personal history of irradiation: Secondary | ICD-10-CM | POA: Insufficient documentation

## 2019-10-30 DIAGNOSIS — Z7901 Long term (current) use of anticoagulants: Secondary | ICD-10-CM | POA: Diagnosis not present

## 2019-10-30 DIAGNOSIS — I5020 Unspecified systolic (congestive) heart failure: Secondary | ICD-10-CM | POA: Diagnosis not present

## 2019-10-30 DIAGNOSIS — M159 Polyosteoarthritis, unspecified: Secondary | ICD-10-CM | POA: Diagnosis not present

## 2019-10-30 LAB — BASIC METABOLIC PANEL
Anion gap: 10 (ref 5–15)
BUN: 32 mg/dL — ABNORMAL HIGH (ref 8–23)
CO2: 22 mmol/L (ref 22–32)
Calcium: 9.4 mg/dL (ref 8.9–10.3)
Chloride: 107 mmol/L (ref 98–111)
Creatinine, Ser: 2 mg/dL — ABNORMAL HIGH (ref 0.61–1.24)
GFR calc Af Amer: 39 mL/min — ABNORMAL LOW (ref >60)
GFR calc non Af Amer: 33 mL/min — ABNORMAL LOW (ref >60)
Glucose, Bld: 100 mg/dL — ABNORMAL HIGH (ref 70–99)
Potassium: 3.9 mmol/L (ref 3.5–5.1)
Sodium: 139 mmol/L (ref 135–145)

## 2019-10-30 NOTE — Progress Notes (Signed)
Patient ID: [name] [name]., male   DOB: 06/01/1951, 69 y.o.   MRN: [id number] PCP: [name]) Cardiac Surgeon: Dr Roxan Hockey Pulmonologist: Dr. Lamonte Sakai Cardiology: Dr. Aundra Dubin  HPI: [name] [name] is a 69 y.o. with history of COPD, HTN, 3V CAD s/p CABG x 5  (CABG 04/2013: LIMA to LAD, SVG to second diagonal, SVG to ramus intermediate and obtuse marginal 2, SVG to posterior descending), ischemic cardiomyopathy, chronic systolic HF, DM2, PVD and tobacco abuse. St Jude ICD.   Lexiscan Cardiolite in 12/19 showed inferior infarct, no ischemia.  Echo in 12/19 showed EF 35%, mild LVH, mildly decreased RV systolic function.  ABIs in 12/19 were stable compared to the past.    In the fall of 2020, he was diagnosed with non-small cell lung cancer.  He has been treated with radiation and carboplatin/palclitaxel.  He has lost over 20 lbs.  He was admitted 1/21 at [facility], [place] with ICD shocks.  These were found to be inappropriate shocks probably due to atrial fibrillation.  He is now on amiodarone and Eliquis.    TEE in 3/21 was done to assess mitral regurgitation, showing EF 25-30%, mildly decreased RV systolic function, 3+ likely ischemic MR.   Had recent clinic f/u with Dr. Aundra Dubin 09/29/19. Was doing fairly well. Had quit smoking. NYHA Class II-III. Entresto was added to regimen and lasix reduced to 40 mg once daily. BMP followed and renal function and K both remained stable. There was also concern regarding med compliance and he was referred to paramedicine.   He presents back to clinic today for f/u. Doing well. Tolerating Entresto ok. Wt up 3 lb from previous visit but Corvue ok. Impedence above reference curve. No AT/AF. No VT/VF. BP 116/64 (just took am meds). Denies dizziness, syncope/ near syncope. Wt has been stable at home. Remains NYHA Class II-III. Denies CP. No ICD shocks.     ECG: not performed   St Jude device interrogated: stable thoracic impedance. No AT/AF. No VT/VF  Labs  (11/19): K 4.6, creatinine 2.05 Labs(12/19): K 4.3, creatinine 2.07, LDL 57  Labs (9/20): K 4.2, creatinine 2.09, LDL 50 Labs (2/21): TSH normal, K 3.9, creatinine 1.92, AST 56, ALT 176 Labs (3/21): K 3.4, creatinine 1.57, hgb 10.7, LFTs normal Labs (09/29/19): K 3.8, creatinine 1.61 Labs (10/10/19): k 3.9, creatinine 1.79     PMH: 1. Chronic systolic CHF: Ischemic cardiomyopathy.  St Jude ICD.  - ECHO 06/26/13 EF 20-25% RV mild HK - ECHO 10/10/13 EF 20-25% restrictive filling pattern. Moderate RV dysfunction. Mild MR. - Cardiolite (10/16) with EF 19%, anterior ischemia.  - Echo 7/17 EF 30-35% - Echo (12/19): EF 35%, mild LVH, basal-mid inferior akinesis, mildly decreased RV systolic function, mild MR.  - TEE (3/21): EF 25-20%, mild LV dilation, mildly decreased RV systolic function, 3+ MR with restricted posterior leaflet, PISA ERO 0.22 cm^2.  2. CAD: CABG x 5 11/14 with LIMA-LAD, SVG-D2, seq SVG-ramus/OM2, SVG-PDA.  - LHC (10/16) with patent LIMA-LAD and 60% pLAD stenosis (competitive flow). SVG-D, sequential SVG-ramus/OM2, SVG-PDA all patent. Medical management.  - Lexiscan Cardiolite in 12/19 showed inferior infarction, no ischemia.  3. COPD: Has not quit smoking.   4. PAD:  - ABIs 11/07/13: RABI .63 moderate arterial occlusive dz,  LABI .78 moderate arterial occlusive dz - ABIs 9/16: RABI .68, LABI 0.75 - ABIs 12/19: right ABI 0.75, left ABI 0.7 5. Type 2 diabetes 6. Hyperlipidemia 7. Osteoarthritis: Left THR in 6/20.  8. Gout  9. Hyperlipidemia  10. CKD: Stage 3.  11. Non-small cell lung cancer: Diagnosed 10/20, treated with carboplatin/paclitaxel + radiation.   12. Atrial fibrillation: Paroxysmal.  13. Mitral regurgitation: Suspect ischemic MR with restricted posterior leaflet.  3+ MR on 3/21 TEE.   SH: Lives with his girlfriend.  Quit smoking in 2020. Does not drink alcohol.   FH: 2 brothers died from heart attacks, Mother DM, Dad HTN   ROS: All systems negative except as listed  in HPI, PMH and Problem List.  Current Outpatient Medications  Medication Sig Dispense Refill  . acetaminophen (TYLENOL) 325 MG tablet Take by mouth every 6 (six) hours as needed for mild pain or moderate pain.    Marland Kitchen albuterol (ACCUNEB) 1.25 MG/3ML nebulizer solution Take 1 ampule by nebulization every 6 (six) hours as needed for wheezing.    Marland Kitchen albuterol (VENTOLIN HFA) 108 (90 Base) MCG/ACT inhaler Inhale 1-2 puffs into the lungs every 6 (six) hours as needed for wheezing or shortness of breath. 6.7 g 6  . allopurinol (ZYLOPRIM) 300 MG tablet Take 300 mg by mouth daily.    Marland Kitchen amiodarone (PACERONE) 400 MG tablet Take 0.5 tablets (200 mg total) by mouth daily. (Patient taking differently: Take 400 mg by mouth 2 (two) times daily. ) 30 tablet 5  . atorvastatin (LIPITOR) 20 MG tablet Take 20 mg by mouth daily.    Marland Kitchen azelastine (OPTIVAR) 0.05 % ophthalmic solution Place 1 drop into the left eye 2 (two) times daily.   0  . b complex vitamins tablet Take 1 tablet by mouth at bedtime.     . B Complex-C-Folic Acid (RENA-VITE RX) 1 MG TABS Take 1 tablet by mouth daily.    . chlorproMAZINE (THORAZINE) 25 MG tablet Take 1 tablet (25 mg total) by mouth 3 (three) times daily as needed. (Patient taking differently: Take 25 mg by mouth 3 (three) times daily. ) 30 tablet 0  . Cholecalciferol (VITAMIN D3) 25 MCG (1000 UT) CAPS Take 1,000 Units by mouth daily at 12 noon.     Marland Kitchen ELIQUIS 5 MG TABS tablet Take 5 mg by mouth 2 (two) times daily.     . furosemide (LASIX) 40 MG tablet Take 1 tablet (40 mg total) by mouth daily. 30 tablet 5  . guaiFENesin-dextromethorphan (ROBITUSSIN DM) 100-10 MG/5ML syrup Take 5 mLs by mouth every 4 (four) hours as needed for cough.    . hydrALAZINE (APRESOLINE) 25 MG tablet Take 0.5 tablets (12.5 mg total) by mouth 3 (three) times daily. 135 tablet 3  . Melatonin 3 MG TABS Take 3 mg by mouth at bedtime.    . metoprolol succinate (TOPROL-XL) 50 MG 24 hr tablet Take 50 mg by mouth in the  morning and at bedtime.     Marland Kitchen omeprazole (PRILOSEC) 40 MG capsule Take 40 mg by mouth daily.    . potassium chloride SA (KLOR-CON) 20 MEQ tablet Take 1 tablet (20 mEq total) by mouth daily. (Patient taking differently: Take 20 mEq by mouth daily at 12 noon. ) 2 tablet 0  . predniSONE (DELTASONE) 20 MG tablet 4 tablet p.o. daily for 1 week then 3 tablet p.o. daily for 1 week then 2 tablet p.o. daily for 1 week then 1 tablet p.o. daily for 1 week until I see the patient. 70 tablet 0  . promethazine (PHENERGAN) 25 MG tablet Take 25 mg by mouth every 6 (six) hours as needed for nausea or vomiting.    . sacubitril-valsartan (ENTRESTO) 24-26 MG Take 1 tablet  by mouth 2 (two) times daily. 60 tablet 5  . senna (SENOKOT) 8.6 MG TABS tablet Take 1 tablet (8.6 mg total) by mouth 2 (two) times daily. (Patient taking differently: Take 1 tablet by mouth at bedtime. ) 120 each 0  . spironolactone (ALDACTONE) 25 MG tablet Take 1 tablet (25 mg total) by mouth at bedtime. 90 tablet 1  . sucralfate (CARAFATE) 1 g tablet Take 1 tablet (1 g total) by mouth 4 (four) times daily -  with meals and at bedtime. 5 min before meals for radiation induced esophagitis (Patient taking differently: Take 1 g by mouth 4 (four) times daily. 5 min before meals for radiation induced esophagitis) 120 tablet 2  . tiZANidine (ZANAFLEX) 4 MG capsule Take 4 mg by mouth 3 (three) times daily as needed for muscle spasms.      No current facility-administered medications for this encounter.    Vitals:   10/30/19 0915  BP: 116/64  Pulse: 95  SpO2: 98%  Weight: 91.4 kg (201 lb 6.4 oz)    Wt Readings from Last 3 Encounters:  10/30/19 91.4 kg (201 lb 6.4 oz)  10/24/19 90.1 kg (198 lb 9.6 oz)  09/29/19 89.1 kg (196 lb 6.4 oz)   PHYSICAL EXAM: General:  Well appearing. No respiratory difficulty HEENT: normal Neck: supple. no JVD. Carotids 2+ bilat; no bruits. No lymphadenopathy or thyromegaly appreciated. Cor: PMI nondisplaced. Regular  rate & rhythm. No rubs, gallops or murmurs. Lungs: clear Abdomen: soft, nontender, nondistended. No hepatosplenomegaly. No bruits or masses. Good bowel sounds. Extremities: no cyanosis, clubbing, rash, edema Neuro: alert & oriented x 3, cranial nerves grossly intact. moves all 4 extremities w/o difficulty. Affect pleasant.   ASSESSMENT & PLAN:  1. CAD s/p CABG: Had cath in 10/16 with patent grafts, medical management.  Cardiolite in 12/19 showed no ischemia (just prior infarction).  - denies anginal symptoms  - Continue statin +  blocker - off ASA due to Eliquis  2. Chronic systolic HF: Ischemic cardiomyopathy, St Jude ICD. Echo 12/19 with EF 35%.   - chronically NYHA Class II-III, think he is primarily limited by deconditioning and COPD.  - Euvolemic on exam and by Corvue  - Continue Entresto 24-26 mg bid. BP too soft for titration  - Continue Lasix 40 mg once daily. We discussed daily wts and sliding scale dosing  - Continue Toprol XL 50 mg daily - Continue spironolactone 25 mg daily.  - Check BMP today  3. COPD/smoking: He has now quit smoking. He was congratulated on his efforts.  4. PAD: Stable claudication and stable ABIs in 12/19. He tries to walk through the pain.   - encouraged to continue walking  - Continue statin.   - Repeat LE arterial dopplers w/ ABIs 5. CKD: Stage 3.   - Repeat BMP today  6. Atrial fibrillation: Paroxysmal.  RRR on exam. Had inappropriate ICD shocks in setting of atrial fibrillation with RVR in the past  - device interrogation today shows no AT/AF episodes. No VT/VF - Continue Eliquis. Denies abnormal bleeding.  - Continue amiodarone, 200 mg daily. Follow LFTs and TSH q6 months (TSH 2/21 and CMP 3/21 ok), will need regular eye exam.  7. Elevated LFTs: Resolved on 3/21 labs.  8. Lung cancer: NSCLC.  Radiation is completed, still getting chemotherapy.   F/u w/ pharmD in 4 weeks to try to titrate meds further. F/u with Dr. Aundra Dubin in 3 months    Lyda Jester, PA-C 10/30/2019

## 2019-10-30 NOTE — Patient Instructions (Signed)
It was great to see you today! No medication changes are needed at this time.  Labs today We will only contact you if something comes back abnormal or we need to make some changes. Otherwise no news is good news!  Your physician recommends that you schedule a follow-up appointment in: 4 week with pharmacy  Your physician recommends that you schedule a follow-up appointment in: 12 weeks with Dr Aundra Dubin  Do the following things EVERYDAY: 1) Weigh yourself in the morning before breakfast. Write it down and keep it in a log. 2) Take your medicines as prescribed 3) Eat low salt foods--Limit salt (sodium) to 2000 mg per day.  4) Stay as active as you can everyday 5) Limit all fluids for the day to less than 2 liters  At the [facility], you and your health needs are our priority. As part of our continuing mission to provide you with exceptional heart care, we have created designated Provider Care Teams. These Care Teams include your primary Cardiologist (physician) and Advanced Practice Providers (APPs- Physician Assistants and Nurse Practitioners) who all work together to provide you with the care you need, when you need it.   You may see any of the following providers on your designated Care Team at your next follow up: Marland Kitchen Dr Glori Bickers . Dr Loralie Champagne . Darrick Grinder, NP . Lyda Jester, PA . Audry Riles, PharmD   Please be sure to bring in all your medications bottles to every appointment.

## 2019-10-31 ENCOUNTER — Other Ambulatory Visit: Payer: Self-pay | Admitting: Internal Medicine

## 2019-11-01 ENCOUNTER — Other Ambulatory Visit (HOSPITAL_COMMUNITY): Payer: Self-pay | Admitting: Cardiology

## 2019-11-03 ENCOUNTER — Other Ambulatory Visit (HOSPITAL_COMMUNITY): Payer: Self-pay | Admitting: Cardiology

## 2019-11-07 DIAGNOSIS — R32 Unspecified urinary incontinence: Secondary | ICD-10-CM | POA: Diagnosis not present

## 2019-11-08 ENCOUNTER — Encounter (HOSPITAL_COMMUNITY): Payer: Self-pay | Admitting: Cardiology

## 2019-11-10 ENCOUNTER — Other Ambulatory Visit (HOSPITAL_COMMUNITY): Payer: Self-pay | Admitting: Cardiology

## 2019-11-10 DIAGNOSIS — I739 Peripheral vascular disease, unspecified: Secondary | ICD-10-CM

## 2019-11-13 ENCOUNTER — Ambulatory Visit (HOSPITAL_BASED_OUTPATIENT_CLINIC_OR_DEPARTMENT_OTHER)
Admission: RE | Admit: 2019-11-13 | Discharge: 2019-11-13 | Disposition: A | Discharge status: Home / Self Care | Payer: Medicare HMO | Source: Ambulatory Visit | Attending: Cardiology | Admitting: Cardiology

## 2019-11-13 ENCOUNTER — Other Ambulatory Visit: Payer: Self-pay

## 2019-11-13 DIAGNOSIS — I5042 Chronic combined systolic (congestive) and diastolic (congestive) heart failure: Secondary | ICD-10-CM | POA: Diagnosis present

## 2019-11-13 DIAGNOSIS — I5022 Chronic systolic (congestive) heart failure: Secondary | ICD-10-CM | POA: Diagnosis not present

## 2019-11-13 DIAGNOSIS — I10 Essential (primary) hypertension: Secondary | ICD-10-CM | POA: Diagnosis not present

## 2019-11-13 DIAGNOSIS — K635 Polyp of colon: Secondary | ICD-10-CM | POA: Diagnosis not present

## 2019-11-13 DIAGNOSIS — K5731 Diverticulosis of large intestine without perforation or abscess with bleeding: Secondary | ICD-10-CM | POA: Diagnosis present

## 2019-11-13 DIAGNOSIS — E785 Hyperlipidemia, unspecified: Secondary | ICD-10-CM | POA: Diagnosis present

## 2019-11-13 DIAGNOSIS — K209 Esophagitis, unspecified without bleeding: Secondary | ICD-10-CM | POA: Diagnosis not present

## 2019-11-13 DIAGNOSIS — I2510 Atherosclerotic heart disease of native coronary artery without angina pectoris: Secondary | ICD-10-CM | POA: Diagnosis present

## 2019-11-13 DIAGNOSIS — K2980 Duodenitis without bleeding: Secondary | ICD-10-CM | POA: Diagnosis not present

## 2019-11-13 DIAGNOSIS — I130 Hypertensive heart and chronic kidney disease with heart failure and stage 1 through stage 4 chronic kidney disease, or unspecified chronic kidney disease: Secondary | ICD-10-CM | POA: Diagnosis present

## 2019-11-13 DIAGNOSIS — C3492 Malignant neoplasm of unspecified part of left bronchus or lung: Secondary | ICD-10-CM | POA: Diagnosis not present

## 2019-11-13 DIAGNOSIS — E1122 Type 2 diabetes mellitus with diabetic chronic kidney disease: Secondary | ICD-10-CM | POA: Diagnosis present

## 2019-11-13 DIAGNOSIS — K922 Gastrointestinal hemorrhage, unspecified: Secondary | ICD-10-CM | POA: Diagnosis not present

## 2019-11-13 DIAGNOSIS — K5730 Diverticulosis of large intestine without perforation or abscess without bleeding: Secondary | ICD-10-CM | POA: Diagnosis not present

## 2019-11-13 DIAGNOSIS — Z125 Encounter for screening for malignant neoplasm of prostate: Secondary | ICD-10-CM | POA: Diagnosis not present

## 2019-11-13 DIAGNOSIS — N183 Chronic kidney disease, stage 3 unspecified: Secondary | ICD-10-CM | POA: Diagnosis not present

## 2019-11-13 DIAGNOSIS — D62 Acute posthemorrhagic anemia: Secondary | ICD-10-CM | POA: Diagnosis present

## 2019-11-13 DIAGNOSIS — E876 Hypokalemia: Secondary | ICD-10-CM | POA: Diagnosis present

## 2019-11-13 DIAGNOSIS — Z7901 Long term (current) use of anticoagulants: Secondary | ICD-10-CM | POA: Diagnosis not present

## 2019-11-13 DIAGNOSIS — Z1211 Encounter for screening for malignant neoplasm of colon: Secondary | ICD-10-CM | POA: Diagnosis not present

## 2019-11-13 DIAGNOSIS — F17210 Nicotine dependence, cigarettes, uncomplicated: Secondary | ICD-10-CM | POA: Diagnosis present

## 2019-11-13 DIAGNOSIS — D122 Benign neoplasm of ascending colon: Secondary | ICD-10-CM | POA: Diagnosis not present

## 2019-11-13 DIAGNOSIS — J8410 Pulmonary fibrosis, unspecified: Secondary | ICD-10-CM | POA: Diagnosis not present

## 2019-11-13 DIAGNOSIS — K2970 Gastritis, unspecified, without bleeding: Secondary | ICD-10-CM | POA: Diagnosis not present

## 2019-11-13 DIAGNOSIS — Z85118 Personal history of other malignant neoplasm of bronchus and lung: Secondary | ICD-10-CM | POA: Diagnosis not present

## 2019-11-13 DIAGNOSIS — M109 Gout, unspecified: Secondary | ICD-10-CM | POA: Diagnosis present

## 2019-11-13 DIAGNOSIS — Z1212 Encounter for screening for malignant neoplasm of rectum: Secondary | ICD-10-CM | POA: Diagnosis not present

## 2019-11-13 DIAGNOSIS — C3412 Malignant neoplasm of upper lobe, left bronchus or lung: Secondary | ICD-10-CM | POA: Diagnosis present

## 2019-11-13 DIAGNOSIS — N1831 Chronic kidney disease, stage 3a: Secondary | ICD-10-CM | POA: Diagnosis present

## 2019-11-13 DIAGNOSIS — J449 Chronic obstructive pulmonary disease, unspecified: Secondary | ICD-10-CM | POA: Diagnosis not present

## 2019-11-13 DIAGNOSIS — E119 Type 2 diabetes mellitus without complications: Secondary | ICD-10-CM | POA: Diagnosis not present

## 2019-11-13 DIAGNOSIS — K317 Polyp of stomach and duodenum: Secondary | ICD-10-CM | POA: Diagnosis present

## 2019-11-13 DIAGNOSIS — D649 Anemia, unspecified: Secondary | ICD-10-CM | POA: Diagnosis present

## 2019-11-13 DIAGNOSIS — R0602 Shortness of breath: Secondary | ICD-10-CM | POA: Diagnosis not present

## 2019-11-13 DIAGNOSIS — I255 Ischemic cardiomyopathy: Secondary | ICD-10-CM | POA: Diagnosis present

## 2019-11-13 DIAGNOSIS — I959 Hypotension, unspecified: Secondary | ICD-10-CM | POA: Diagnosis not present

## 2019-11-13 DIAGNOSIS — I4891 Unspecified atrial fibrillation: Secondary | ICD-10-CM | POA: Diagnosis not present

## 2019-11-13 DIAGNOSIS — R7889 Finding of other specified substances, not normally found in blood: Secondary | ICD-10-CM | POA: Diagnosis not present

## 2019-11-13 DIAGNOSIS — K621 Rectal polyp: Secondary | ICD-10-CM | POA: Diagnosis not present

## 2019-11-13 DIAGNOSIS — K228 Other specified diseases of esophagus: Secondary | ICD-10-CM | POA: Diagnosis not present

## 2019-11-13 DIAGNOSIS — J701 Chronic and other pulmonary manifestations due to radiation: Secondary | ICD-10-CM | POA: Diagnosis present

## 2019-11-13 DIAGNOSIS — D509 Iron deficiency anemia, unspecified: Secondary | ICD-10-CM | POA: Diagnosis present

## 2019-11-13 DIAGNOSIS — I480 Paroxysmal atrial fibrillation: Secondary | ICD-10-CM | POA: Diagnosis present

## 2019-11-13 DIAGNOSIS — D132 Benign neoplasm of duodenum: Secondary | ICD-10-CM | POA: Diagnosis not present

## 2019-11-13 DIAGNOSIS — K219 Gastro-esophageal reflux disease without esophagitis: Secondary | ICD-10-CM | POA: Diagnosis present

## 2019-11-13 DIAGNOSIS — I509 Heart failure, unspecified: Secondary | ICD-10-CM | POA: Diagnosis not present

## 2019-11-13 DIAGNOSIS — E1142 Type 2 diabetes mellitus with diabetic polyneuropathy: Secondary | ICD-10-CM | POA: Diagnosis present

## 2019-11-13 DIAGNOSIS — I739 Peripheral vascular disease, unspecified: Secondary | ICD-10-CM | POA: Diagnosis not present

## 2019-11-13 DIAGNOSIS — J439 Emphysema, unspecified: Secondary | ICD-10-CM | POA: Diagnosis present

## 2019-11-13 DIAGNOSIS — D124 Benign neoplasm of descending colon: Secondary | ICD-10-CM | POA: Diagnosis not present

## 2019-11-13 DIAGNOSIS — K5900 Constipation, unspecified: Secondary | ICD-10-CM | POA: Diagnosis present

## 2019-11-13 DIAGNOSIS — Z20822 Contact with and (suspected) exposure to covid-19: Secondary | ICD-10-CM | POA: Diagnosis present

## 2019-11-13 DIAGNOSIS — R5381 Other malaise: Secondary | ICD-10-CM | POA: Diagnosis not present

## 2019-11-13 DIAGNOSIS — D631 Anemia in chronic kidney disease: Secondary | ICD-10-CM | POA: Diagnosis present

## 2019-11-13 DIAGNOSIS — R9431 Abnormal electrocardiogram [ECG] [EKG]: Secondary | ICD-10-CM | POA: Diagnosis not present

## 2019-11-13 DIAGNOSIS — E1151 Type 2 diabetes mellitus with diabetic peripheral angiopathy without gangrene: Secondary | ICD-10-CM | POA: Diagnosis present

## 2019-11-14 ENCOUNTER — Encounter (HOSPITAL_COMMUNITY): Payer: Self-pay

## 2019-11-14 ENCOUNTER — Other Ambulatory Visit: Payer: Self-pay | Admitting: Physician Assistant

## 2019-11-14 ENCOUNTER — Emergency Department (HOSPITAL_COMMUNITY): Payer: Medicare HMO

## 2019-11-14 ENCOUNTER — Other Ambulatory Visit: Payer: Self-pay

## 2019-11-14 ENCOUNTER — Inpatient Hospital Stay (HOSPITAL_COMMUNITY)
Admission: EM | Admit: 2019-11-14 | Discharge: 2019-11-18 | DRG: 811 | Disposition: A | Discharge status: Home / Self Care | Payer: Medicare HMO | Attending: Internal Medicine | Admitting: Internal Medicine

## 2019-11-14 DIAGNOSIS — Z96641 Presence of right artificial hip joint: Secondary | ICD-10-CM | POA: Diagnosis present

## 2019-11-14 DIAGNOSIS — Z1211 Encounter for screening for malignant neoplasm of colon: Secondary | ICD-10-CM | POA: Diagnosis not present

## 2019-11-14 DIAGNOSIS — D62 Acute posthemorrhagic anemia: Principal | ICD-10-CM | POA: Diagnosis present

## 2019-11-14 DIAGNOSIS — I5022 Chronic systolic (congestive) heart failure: Secondary | ICD-10-CM

## 2019-11-14 DIAGNOSIS — K5900 Constipation, unspecified: Secondary | ICD-10-CM | POA: Diagnosis present

## 2019-11-14 DIAGNOSIS — K219 Gastro-esophageal reflux disease without esophagitis: Secondary | ICD-10-CM | POA: Diagnosis present

## 2019-11-14 DIAGNOSIS — E1142 Type 2 diabetes mellitus with diabetic polyneuropathy: Secondary | ICD-10-CM | POA: Diagnosis present

## 2019-11-14 DIAGNOSIS — R9431 Abnormal electrocardiogram [ECG] [EKG]: Secondary | ICD-10-CM | POA: Diagnosis present

## 2019-11-14 DIAGNOSIS — C3412 Malignant neoplasm of upper lobe, left bronchus or lung: Secondary | ICD-10-CM | POA: Diagnosis present

## 2019-11-14 DIAGNOSIS — D649 Anemia, unspecified: Secondary | ICD-10-CM | POA: Diagnosis present

## 2019-11-14 DIAGNOSIS — Z7901 Long term (current) use of anticoagulants: Secondary | ICD-10-CM | POA: Diagnosis not present

## 2019-11-14 DIAGNOSIS — J701 Chronic and other pulmonary manifestations due to radiation: Secondary | ICD-10-CM | POA: Diagnosis present

## 2019-11-14 DIAGNOSIS — E876 Hypokalemia: Secondary | ICD-10-CM | POA: Diagnosis present

## 2019-11-14 DIAGNOSIS — D631 Anemia in chronic kidney disease: Secondary | ICD-10-CM | POA: Diagnosis present

## 2019-11-14 DIAGNOSIS — K5731 Diverticulosis of large intestine without perforation or abscess with bleeding: Secondary | ICD-10-CM | POA: Diagnosis present

## 2019-11-14 DIAGNOSIS — D509 Iron deficiency anemia, unspecified: Secondary | ICD-10-CM | POA: Diagnosis present

## 2019-11-14 DIAGNOSIS — E1122 Type 2 diabetes mellitus with diabetic chronic kidney disease: Secondary | ICD-10-CM | POA: Diagnosis present

## 2019-11-14 DIAGNOSIS — K317 Polyp of stomach and duodenum: Secondary | ICD-10-CM | POA: Diagnosis present

## 2019-11-14 DIAGNOSIS — E1151 Type 2 diabetes mellitus with diabetic peripheral angiopathy without gangrene: Secondary | ICD-10-CM | POA: Diagnosis present

## 2019-11-14 DIAGNOSIS — F1290 Cannabis use, unspecified, uncomplicated: Secondary | ICD-10-CM | POA: Diagnosis present

## 2019-11-14 DIAGNOSIS — Z95810 Presence of automatic (implantable) cardiac defibrillator: Secondary | ICD-10-CM

## 2019-11-14 DIAGNOSIS — K641 Second degree hemorrhoids: Secondary | ICD-10-CM | POA: Diagnosis present

## 2019-11-14 DIAGNOSIS — I255 Ischemic cardiomyopathy: Secondary | ICD-10-CM | POA: Diagnosis present

## 2019-11-14 DIAGNOSIS — K635 Polyp of colon: Secondary | ICD-10-CM | POA: Diagnosis present

## 2019-11-14 DIAGNOSIS — I2510 Atherosclerotic heart disease of native coronary artery without angina pectoris: Secondary | ICD-10-CM | POA: Diagnosis present

## 2019-11-14 DIAGNOSIS — I130 Hypertensive heart and chronic kidney disease with heart failure and stage 1 through stage 4 chronic kidney disease, or unspecified chronic kidney disease: Secondary | ICD-10-CM | POA: Diagnosis present

## 2019-11-14 DIAGNOSIS — Z79899 Other long term (current) drug therapy: Secondary | ICD-10-CM

## 2019-11-14 DIAGNOSIS — Z8249 Family history of ischemic heart disease and other diseases of the circulatory system: Secondary | ICD-10-CM

## 2019-11-14 DIAGNOSIS — M109 Gout, unspecified: Secondary | ICD-10-CM | POA: Diagnosis present

## 2019-11-14 DIAGNOSIS — I480 Paroxysmal atrial fibrillation: Secondary | ICD-10-CM | POA: Diagnosis present

## 2019-11-14 DIAGNOSIS — Z951 Presence of aortocoronary bypass graft: Secondary | ICD-10-CM

## 2019-11-14 DIAGNOSIS — Z9221 Personal history of antineoplastic chemotherapy: Secondary | ICD-10-CM

## 2019-11-14 DIAGNOSIS — Z833 Family history of diabetes mellitus: Secondary | ICD-10-CM

## 2019-11-14 DIAGNOSIS — E785 Hyperlipidemia, unspecified: Secondary | ICD-10-CM | POA: Diagnosis present

## 2019-11-14 DIAGNOSIS — F17210 Nicotine dependence, cigarettes, uncomplicated: Secondary | ICD-10-CM | POA: Diagnosis present

## 2019-11-14 DIAGNOSIS — N1831 Chronic kidney disease, stage 3a: Secondary | ICD-10-CM | POA: Diagnosis present

## 2019-11-14 DIAGNOSIS — I5042 Chronic combined systolic (congestive) and diastolic (congestive) heart failure: Secondary | ICD-10-CM | POA: Diagnosis present

## 2019-11-14 DIAGNOSIS — K922 Gastrointestinal hemorrhage, unspecified: Secondary | ICD-10-CM | POA: Diagnosis present

## 2019-11-14 DIAGNOSIS — I4891 Unspecified atrial fibrillation: Secondary | ICD-10-CM | POA: Diagnosis present

## 2019-11-14 DIAGNOSIS — Z20822 Contact with and (suspected) exposure to covid-19: Secondary | ICD-10-CM | POA: Diagnosis present

## 2019-11-14 DIAGNOSIS — N183 Chronic kidney disease, stage 3 unspecified: Secondary | ICD-10-CM | POA: Diagnosis present

## 2019-11-14 DIAGNOSIS — J439 Emphysema, unspecified: Secondary | ICD-10-CM | POA: Diagnosis present

## 2019-11-14 DIAGNOSIS — Z1212 Encounter for screening for malignant neoplasm of rectum: Secondary | ICD-10-CM

## 2019-11-14 DIAGNOSIS — I959 Hypotension, unspecified: Secondary | ICD-10-CM | POA: Diagnosis not present

## 2019-11-14 DIAGNOSIS — Z85118 Personal history of other malignant neoplasm of bronchus and lung: Secondary | ICD-10-CM

## 2019-11-14 DIAGNOSIS — Z880 Allergy status to penicillin: Secondary | ICD-10-CM

## 2019-11-14 DIAGNOSIS — Z923 Personal history of irradiation: Secondary | ICD-10-CM

## 2019-11-14 LAB — BASIC METABOLIC PANEL
Anion gap: 8 (ref 5–15)
BUN: 34 mg/dL — ABNORMAL HIGH (ref 8–23)
CO2: 24 mmol/L (ref 22–32)
Calcium: 9.5 mg/dL (ref 8.9–10.3)
Chloride: 105 mmol/L (ref 98–111)
Creatinine, Ser: 1.74 mg/dL — ABNORMAL HIGH (ref 0.61–1.24)
GFR calc Af Amer: 46 mL/min — ABNORMAL LOW (ref >60)
GFR calc non Af Amer: 39 mL/min — ABNORMAL LOW (ref >60)
Glucose, Bld: 121 mg/dL — ABNORMAL HIGH (ref 70–99)
Potassium: 3.4 mmol/L — ABNORMAL LOW (ref 3.5–5.1)
Sodium: 137 mmol/L (ref 135–145)

## 2019-11-14 LAB — RETICULOCYTES
Immature Retic Fract: 27 % — ABNORMAL HIGH (ref 2.3–15.9)
RBC.: 2.7 MIL/uL — ABNORMAL LOW (ref 4.22–5.81)
Retic Count, Absolute: 61 10*3/uL (ref 19.0–186.0)
Retic Ct Pct: 2.3 % (ref 0.4–3.1)

## 2019-11-14 LAB — HIV ANTIBODY (ROUTINE TESTING W REFLEX): HIV Screen 4th Generation wRfx: NONREACTIVE

## 2019-11-14 LAB — HEPATIC FUNCTION PANEL
ALT: 33 U/L (ref 0–44)
AST: 29 U/L (ref 15–41)
Albumin: 3.1 g/dL — ABNORMAL LOW (ref 3.5–5.0)
Alkaline Phosphatase: 65 U/L (ref 38–126)
Bilirubin, Direct: <0.1 mg/dL (ref 0.0–0.2)
Total Bilirubin: 0.4 mg/dL (ref 0.3–1.2)
Total Protein: 6.5 g/dL (ref 6.5–8.1)

## 2019-11-14 LAB — PREPARE RBC (CROSSMATCH)

## 2019-11-14 LAB — CBC
HCT: 22.9 % — ABNORMAL LOW (ref 39.0–52.0)
Hemoglobin: 6.5 g/dL — CL (ref 13.0–17.0)
MCH: 23.5 pg — ABNORMAL LOW (ref 26.0–34.0)
MCHC: 28.4 g/dL — ABNORMAL LOW (ref 30.0–36.0)
MCV: 82.7 fL (ref 80.0–100.0)
Platelets: 238 10*3/uL (ref 150–400)
RBC: 2.77 MIL/uL — ABNORMAL LOW (ref 4.22–5.81)
RDW: 19.1 % — ABNORMAL HIGH (ref 11.5–15.5)
WBC: 5.9 10*3/uL (ref 4.0–10.5)
nRBC: 0 % (ref 0.0–0.2)

## 2019-11-14 LAB — IRON AND TIBC
Iron: 11 ug/dL — ABNORMAL LOW (ref 45–182)
Saturation Ratios: 4 % — ABNORMAL LOW (ref 17.9–39.5)
TIBC: 311 ug/dL (ref 250–450)
UIBC: 300 ug/dL

## 2019-11-14 LAB — VITAMIN B12: Vitamin B-12: 349 pg/mL (ref 180–914)

## 2019-11-14 LAB — FERRITIN: Ferritin: 17 ng/mL — ABNORMAL LOW (ref 24–336)

## 2019-11-14 LAB — FOLATE: Folate: 27.4 ng/mL (ref >5.9)

## 2019-11-14 LAB — POC OCCULT BLOOD, ED: Fecal Occult Bld: POSITIVE — AB

## 2019-11-14 LAB — SARS CORONAVIRUS 2 BY RT PCR (HOSPITAL ORDER, PERFORMED IN ~~LOC~~ HOSPITAL LAB): SARS Coronavirus 2: NEGATIVE

## 2019-11-14 MED ORDER — GUAIFENESIN-DM 100-10 MG/5ML PO SYRP: 5.0000 mL | ORAL_SOLUTION | ORAL | Status: DC | PRN

## 2019-11-14 MED ORDER — PEG-KCL-NACL-NASULF-NA ASC-C 100 G PO SOLR
0.5000 | Freq: Once | ORAL | Status: AC
  Administered 2019-11-15: 100 g via ORAL
  Filled 2019-11-14: qty 1

## 2019-11-14 MED ORDER — TIOTROPIUM BROMIDE MONOHYDRATE 18 MCG IN CAPS: 1.0000 | ORAL_CAPSULE | Freq: Every day | RESPIRATORY_TRACT | Status: DC

## 2019-11-14 MED ORDER — OXYCODONE-ACETAMINOPHEN 5-325 MG PO TABS
1.0000 | ORAL_TABLET | Freq: Four times a day (QID) | ORAL | Status: DC | PRN
  Administered 2019-11-14: 1 via ORAL
  Filled 2019-11-14: qty 1

## 2019-11-14 MED ORDER — ACETAMINOPHEN 650 MG RE SUPP: 650.0000 mg | Freq: Four times a day (QID) | RECTAL | Status: DC | PRN

## 2019-11-14 MED ORDER — FUROSEMIDE 10 MG/ML IJ SOLN
40.0000 mg | Freq: Once | INTRAMUSCULAR | Status: AC
  Administered 2019-11-14: 40 mg via INTRAVENOUS
  Filled 2019-11-14: qty 4

## 2019-11-14 MED ORDER — MELATONIN 3 MG PO TABS
3.0000 mg | ORAL_TABLET | Freq: Every day | ORAL | Status: DC
  Administered 2019-11-14 – 2019-11-17 (×4): 3 mg via ORAL
  Filled 2019-11-14 (×4): qty 1

## 2019-11-14 MED ORDER — METOPROLOL SUCCINATE ER 50 MG PO TB24
50.0000 mg | ORAL_TABLET | Freq: Two times a day (BID) | ORAL | Status: DC
  Administered 2019-11-14 – 2019-11-18 (×6): 50 mg via ORAL
  Filled 2019-11-14 (×8): qty 1

## 2019-11-14 MED ORDER — SUCRALFATE 1 G PO TABS
1.0000 g | ORAL_TABLET | Freq: Four times a day (QID) | ORAL | Status: DC
  Administered 2019-11-14 – 2019-11-18 (×14): 1 g via ORAL
  Filled 2019-11-14 (×14): qty 1

## 2019-11-14 MED ORDER — AMIODARONE HCL 200 MG PO TABS
200.0000 mg | ORAL_TABLET | Freq: Every day | ORAL | Status: DC
  Administered 2019-11-15 – 2019-11-18 (×4): 200 mg via ORAL
  Filled 2019-11-14 (×4): qty 1

## 2019-11-14 MED ORDER — ATORVASTATIN CALCIUM 10 MG PO TABS
20.0000 mg | ORAL_TABLET | Freq: Every day | ORAL | Status: DC
  Administered 2019-11-15 – 2019-11-18 (×4): 20 mg via ORAL
  Filled 2019-11-14 (×4): qty 2

## 2019-11-14 MED ORDER — PEG-KCL-NACL-NASULF-NA ASC-C 100 G PO SOLR: 1.0000 | Freq: Once | ORAL | Status: DC

## 2019-11-14 MED ORDER — SACUBITRIL-VALSARTAN 24-26 MG PO TABS
1.0000 | ORAL_TABLET | Freq: Two times a day (BID) | ORAL | Status: DC
  Administered 2019-11-15: 1 via ORAL
  Filled 2019-11-14 (×2): qty 1

## 2019-11-14 MED ORDER — SPIRONOLACTONE 25 MG PO TABS
25.0000 mg | ORAL_TABLET | Freq: Every day | ORAL | Status: DC
  Administered 2019-11-15 – 2019-11-16 (×2): 25 mg via ORAL
  Filled 2019-11-14 (×2): qty 1

## 2019-11-14 MED ORDER — HYDRALAZINE HCL 25 MG PO TABS
12.5000 mg | ORAL_TABLET | Freq: Three times a day (TID) | ORAL | Status: DC
  Administered 2019-11-15 – 2019-11-16 (×3): 12.5 mg via ORAL
  Filled 2019-11-14 (×5): qty 1

## 2019-11-14 MED ORDER — FUROSEMIDE 40 MG PO TABS
40.0000 mg | ORAL_TABLET | Freq: Every day | ORAL | Status: DC
  Administered 2019-11-15 – 2019-11-16 (×2): 40 mg via ORAL
  Filled 2019-11-14 (×2): qty 1

## 2019-11-14 MED ORDER — ONDANSETRON HCL 4 MG PO TABS: 4.0000 mg | ORAL_TABLET | Freq: Four times a day (QID) | ORAL | Status: DC | PRN

## 2019-11-14 MED ORDER — UMECLIDINIUM BROMIDE 62.5 MCG/INH IN AEPB
1.0000 | INHALATION_SPRAY | Freq: Every day | RESPIRATORY_TRACT | Status: DC
  Administered 2019-11-16 – 2019-11-18 (×3): 1 via RESPIRATORY_TRACT
  Filled 2019-11-14: qty 7

## 2019-11-14 MED ORDER — KETOTIFEN FUMARATE 0.025 % OP SOLN
1.0000 [drp] | Freq: Two times a day (BID) | OPHTHALMIC | Status: DC
  Administered 2019-11-14 – 2019-11-18 (×7): 1 [drp] via OPHTHALMIC
  Filled 2019-11-14: qty 5

## 2019-11-14 MED ORDER — POTASSIUM CHLORIDE CRYS ER 20 MEQ PO TBCR
40.0000 meq | EXTENDED_RELEASE_TABLET | ORAL | Status: AC
  Administered 2019-11-14: 40 meq via ORAL
  Filled 2019-11-14: qty 2

## 2019-11-14 MED ORDER — ACETAMINOPHEN 325 MG PO TABS: 650.0000 mg | ORAL_TABLET | Freq: Four times a day (QID) | ORAL | Status: DC | PRN

## 2019-11-14 MED ORDER — ALBUTEROL SULFATE (2.5 MG/3ML) 0.083% IN NEBU: 3.0000 mL | INHALATION_SOLUTION | Freq: Four times a day (QID) | RESPIRATORY_TRACT | Status: DC | PRN

## 2019-11-14 MED ORDER — ONDANSETRON HCL 4 MG/2ML IJ SOLN: 4.0000 mg | Freq: Four times a day (QID) | INTRAMUSCULAR | Status: DC | PRN

## 2019-11-14 MED ORDER — ALLOPURINOL 300 MG PO TABS
300.0000 mg | ORAL_TABLET | Freq: Every day | ORAL | Status: DC
  Administered 2019-11-15 – 2019-11-18 (×4): 300 mg via ORAL
  Filled 2019-11-14 (×4): qty 1

## 2019-11-14 MED ORDER — POTASSIUM CHLORIDE CRYS ER 20 MEQ PO TBCR
20.0000 meq | EXTENDED_RELEASE_TABLET | Freq: Every day | ORAL | Status: DC
  Administered 2019-11-15 – 2019-11-17 (×3): 20 meq via ORAL
  Filled 2019-11-14 (×3): qty 1

## 2019-11-14 MED ORDER — SODIUM CHLORIDE 0.9% IV SOLUTION: Freq: Once | INTRAVENOUS | Status: AC

## 2019-11-14 NOTE — Consult Note (Addendum)
Consultation  Referring Provider: Dr. Fuller Plan    Primary Care Physician:  [name], PA-C Primary Gastroenterologist: Althia Forts       Reason for Consultation: Symptomatic anemia            HPI:   [name] [name]. is a 69 y.o. male with a past medical history significant for stage III non-small cell lung cancer status post radiation, chronic anticoagulation with Eliquis, status post AICD placement, combined systolic and diastolic CHF last EF noted to be 25-30% on 09/01/19, COPD, PVD and multiple others listed below, who presented to the ER for an abnormal lab by his PCP.  We are called in regards to symptomatic anemia.    Today, patient explains he had a routine checkup and was called to come to the ER for abnormal blood work.  Over the past few days he has been feeling more tired than normal and reports some shortness of breath on exertion, "like when sweeping the floor".  Other than this has been feeling pretty normal.     Denies fever, chills, weight loss, abdominal pain, change in bowel habits, blood in stool, nausea, vomiting, heartburn or reflux.  ER course: Hemoglobin 6.5, BUN 34, creatinine 1.74  Past Medical History:  Diagnosis Date  . AICD (automatic cardioverter/defibrillator) present    reports no shocks  - st Jude  . Ambulates with cane   . CAD (coronary artery disease)    a. 04/2013 Cath: LM 10, LAD 40p, 61m, D1 50p, LCX 60p, 9m/100m, OM1 50, OM2 100 L-L collats, RCA 100p;  b. CABG x 5: LIMA->LAD, VG->D2, VG->RI->OM2, VG->PDA.  . Cataracts, bilateral   . Chronic systolic CHF (congestive heart failure) ([place])    a. 04/2013 Echo: EF 15-20%, diff HK, sev HK of inf and ant myocardium, dilated LA,  b. EF 20-25% with restrictive filling pattern, mod RV dysfx, mild MR (10/10/2013);  c.  Echo 12/15: EF 25-30%  . COPD (chronic obstructive pulmonary disease) ([place])   . Diabetes mellitus without complication ([place])    unsure what type but was dx as an adult, takes no meds,  check his cbg at home 2 times a week   . Emphysema   . GERD (gastroesophageal reflux disease)   . Gout   . Hyperlipidemia   . Hypertension   . Ischemic cardiomyopathy   . lung ca   . PVD (peripheral vascular disease) ([place])    a. (10/2013) ABIs: RIGHT 0.63, Waveforms: monophasic;  LEFT 0.78, Waveforms: monophasic  . Tobacco abuse    30+ pack-year history  . Transaminitis    a. 04/2013 Abd U/S and CT unremarkable, hepatitis panel neg -->felt to be 2/2 acute R heart failure.    Past Surgical History:  Procedure Laterality Date  . APPENDECTOMY  as a child  . CARDIAC CATHETERIZATION  04/08/2015   Procedure: Left Heart Cath and Cors/Grafts Angiography;  Surgeon: Peter M Martinique, MD;  Location: [facility];  Service: Cardiovascular;;  . COLONOSCOPY    . CORONARY ARTERY BYPASS GRAFT N/A 05/05/2013   Procedure: CORONARY ARTERY BYPASS GRAFTING (CABG) TIMES FIVE USING LEFT INTERNAL MAMMARY ARTERY AND RIGHT AND LEFT SAPHENOUS LEG VEIN HARVESTED ENDOSCOPICALLY;  Surgeon: Melrose Nakayama, MD;  Location: [place];  Service: Open Heart Surgery;  Laterality: N/A;  . IMPLANTABLE CARDIOVERTER DEFIBRILLATOR IMPLANT N/A 07/06/2014   Procedure: St Jude IMPLANTABLE CARDIOVERTER DEFIBRILLATOR IMPLANT;  Surgeon: Deboraha Sprang, MD;  Location: [facility] CATH LAB;  Service: Cardiovascular;  Laterality:  N/A;  . LEFT AND RIGHT HEART CATHETERIZATION WITH CORONARY ANGIOGRAM N/A 04/28/2013   Procedure: LEFT AND RIGHT HEART CATHETERIZATION WITH CORONARY ANGIOGRAM;  Surgeon: Burnell Blanks, MD;  Location: [facility] CATH LAB;  Service: Cardiovascular;  Laterality: N/A;  . MULTIPLE EXTRACTIONS WITH ALVEOLOPLASTY N/A 05/03/2013   Procedure: Extraction of tooth #s 3,4,5,6,8,9,27 with alveoloplast and maxillary left osseous tuberosity reduction;  Surgeon: Lenn Cal, DDS;  Location: [place];  Service: Oral Surgery;  Laterality: N/A;  . TEE WITHOUT CARDIOVERSION N/A 09/01/2019   Procedure: TRANSESOPHAGEAL ECHOCARDIOGRAM  (TEE);  Surgeon: Larey Dresser, MD;  Location: [facility] ENDOSCOPY;  Service: Cardiovascular;  Laterality: N/A;  . TOTAL HIP ARTHROPLASTY Right 11/30/2018   Procedure: TOTAL HIP ARTHROPLASTY ANTERIOR APPROACH;  Surgeon: Rod Can, MD;  Location: [facility];  Service: Orthopedics;  Laterality: Right;  Marland Kitchen VIDEO BRONCHOSCOPY WITH ENDOBRONCHIAL ULTRASOUND N/A 04/20/2019   Procedure: VIDEO BRONCHOSCOPY WITH ENDOBRONCHIAL ULTRASOUND;  Surgeon: Margaretha Seeds, MD;  Location: [place];  Service: Thoracic;  Laterality: N/A;    Family History  Problem Relation Age of Onset  . Diabetes Mother   . Hypertension Father   . Hypertension Other   . Heart attack Other        Brothers  . Diabetes Brother   . Heart attack Brother   . Heart attack Brother   . Stroke Neg Hx     Social History   Tobacco Use  . Smoking status: Former Smoker    Packs/day: 2.00    Years: 30.00    Pack years: 60.00    Types: Cigarettes    Start date: 1969    Quit date: 03/23/2019    Years since quitting: 0.6  . Smokeless tobacco: Never Used  . Tobacco comment: Pt states now smokes - half pack per day 04/05/19  Substance Use Topics  . Alcohol use: Not Currently  . Drug use: Yes    Types: Marijuana    Comment: last use 04/17/19    Prior to Admission medications   Medication Sig Start Date End Date Taking? Authorizing Provider  acetaminophen (TYLENOL) 325 MG tablet Take by mouth every 6 (six) hours as needed for mild pain or moderate pain.    [provider]  albuterol (ACCUNEB) 1.25 MG/3ML nebulizer solution Take 1 ampule by nebulization every 6 (six) hours as needed for wheezing.    [provider]  albuterol (VENTOLIN HFA) 108 (90 Base) MCG/ACT inhaler Inhale 1-2 puffs into the lungs every 6 (six) hours as needed for wheezing or shortness of breath. 04/05/19   Margaretha Seeds, MD  allopurinol (ZYLOPRIM) 300 MG tablet Take 300 mg by mouth daily.    [provider]  amiodarone (PACERONE) 400  MG tablet Take 0.5 tablets (200 mg total) by mouth daily. Patient taking differently: Take 400 mg by mouth 2 (two) times daily.  07/28/19   Larey Dresser, MD  atorvastatin (LIPITOR) 20 MG tablet Take 20 mg by mouth daily.    [provider]  azelastine (OPTIVAR) 0.05 % ophthalmic solution Place 1 drop into the left eye 2 (two) times daily.  04/21/18   [provider]  b complex vitamins tablet Take 1 tablet by mouth at bedtime.     [provider]  B Complex-C-Folic Acid (RENA-VITE RX) 1 MG TABS Take 1 tablet by mouth daily. 07/24/19   [provider]  chlorproMAZINE (THORAZINE) 25 MG tablet Take 1 tablet (25 mg total) by mouth 3 (three) times daily as  needed. Patient taking differently: Take 25 mg by mouth 3 (three) times daily.  08/22/19   Curt Bears, MD  Cholecalciferol (VITAMIN D3) 25 MCG (1000 UT) CAPS Take 1,000 Units by mouth daily at 12 noon.     [provider]  ELIQUIS 5 MG TABS tablet Take 5 mg by mouth 2 (two) times daily.  07/20/19   [provider]  furosemide (LASIX) 40 MG tablet Take 1 tablet (40 mg total) by mouth daily. 09/29/19   Larey Dresser, MD  guaiFENesin-dextromethorphan [facility] DM) 100-10 MG/5ML syrup Take 5 mLs by mouth every 4 (four) hours as needed for cough.    [provider]  hydrALAZINE (APRESOLINE) 25 MG tablet Take 0.5 tablets (12.5 mg total) by mouth 3 (three) times daily. 10/02/19   Larey Dresser, MD  Melatonin 3 MG TABS Take 3 mg by mouth at bedtime.    [provider]  metoprolol succinate (TOPROL-XL) 50 MG 24 hr tablet Take 50 mg by mouth in the morning and at bedtime.  07/20/19   [provider]  omeprazole (PRILOSEC) 40 MG capsule Take 40 mg by mouth daily.    [provider]  potassium chloride SA (KLOR-CON) 20 MEQ tablet Take 1 tablet (20 mEq total) by mouth daily. Patient taking differently: Take 20 mEq by mouth daily at 12 noon.  08/03/19   Fredia Sorrow,  MD  predniSONE (DELTASONE) 20 MG tablet 4 tablet p.o. daily for 1 week then 3 tablet p.o. daily for 1 week then 2 tablet p.o. daily for 1 week then 1 tablet p.o. daily for 1 week until I see the patient. 08/14/19   Curt Bears, MD  promethazine (PHENERGAN) 25 MG tablet Take 25 mg by mouth every 6 (six) hours as needed for nausea or vomiting.    [provider]  sacubitril-valsartan (ENTRESTO) 24-26 MG Take 1 tablet by mouth 2 (two) times daily. 09/29/19   Larey Dresser, MD  senna (SENOKOT) 8.6 MG TABS tablet Take 1 tablet (8.6 mg total) by mouth 2 (two) times daily. Patient taking differently: Take 1 tablet by mouth at bedtime.  12/01/18   Swinteck, Aaron Edelman, MD  spironolactone (ALDACTONE) 25 MG tablet TAKE 1/2 TABLET BY MOUTH EVERY DAY 10/30/19   Larey Dresser, MD  sucralfate (CARAFATE) 1 g tablet Take 1 tablet (1 g total) by mouth 4 (four) times daily -  with meals and at bedtime. 5 min before meals for radiation induced esophagitis Patient taking differently: Take 1 g by mouth 4 (four) times daily. 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     [provider]    Current Facility-Administered Medications  Medication Dose Route Frequency Provider Last Rate Last Admin  . 0.9 %  sodium chloride infusion (Manually program via Guardrails IV Fluids)   Intravenous Once Fuller Plan A, MD      . acetaminophen (TYLENOL) tablet 650 mg  650 mg Oral Q6H PRN Fuller Plan A, MD       Or  . acetaminophen (TYLENOL) suppository 650 mg  650 mg Rectal Q6H PRN Fuller Plan A, MD      . ondansetron (ZOFRAN) tablet 4 mg  4 mg Oral Q6H PRN Smith, Rondell A, MD       Or  . ondansetron (ZOFRAN) injection 4 mg  4 mg  Intravenous Q6H PRN Smith, Rondell A, MD      . potassium chloride SA (KLOR-CON) CR tablet 40 mEq  40 mEq Oral STAT Norval Morton, MD       Current Outpatient Medications  Medication Sig Dispense Refill  . acetaminophen (TYLENOL) 325 MG tablet Take by mouth every 6 (six) hours as needed for mild pain or moderate pain.    Marland Kitchen albuterol (ACCUNEB) 1.25 MG/3ML nebulizer solution Take 1 ampule by nebulization every 6 (six) hours as needed for wheezing.    Marland Kitchen albuterol (VENTOLIN HFA) 108 (90 Base) MCG/ACT inhaler Inhale 1-2 puffs into the lungs every 6 (six) hours as needed for wheezing or shortness of breath. 6.7 g 6  . allopurinol (ZYLOPRIM) 300 MG tablet Take 300 mg by mouth daily.    Marland Kitchen amiodarone (PACERONE) 400 MG tablet Take 0.5 tablets (200 mg total) by mouth daily. (Patient taking differently: Take 400 mg by mouth 2 (two) times daily. ) 30 tablet 5  . atorvastatin (LIPITOR) 20 MG tablet Take 20 mg by mouth daily.    Marland Kitchen azelastine (OPTIVAR) 0.05 % ophthalmic solution Place 1 drop into the left eye 2 (two) times daily.   0  . b complex vitamins tablet Take 1 tablet by mouth at bedtime.     . B Complex-C-Folic Acid (RENA-VITE RX) 1 MG TABS Take 1 tablet by mouth daily.    . chlorproMAZINE (THORAZINE) 25 MG tablet Take 1 tablet (25 mg total) by mouth 3 (three) times daily as needed. (Patient taking differently: Take 25 mg by mouth 3 (three) times daily. ) 30 tablet 0  . Cholecalciferol (VITAMIN D3) 25 MCG (1000 UT) CAPS Take 1,000 Units by mouth daily at 12 noon.     Marland Kitchen ELIQUIS 5 MG TABS tablet Take 5 mg by mouth 2 (two) times daily.     . furosemide (LASIX) 40 MG tablet Take 1 tablet (40 mg total) by mouth daily. 30 tablet 5  . guaiFENesin-dextromethorphan (ROBITUSSIN DM) 100-10 MG/5ML syrup Take 5 mLs by mouth every 4 (four) hours as needed for cough.    . hydrALAZINE (APRESOLINE) 25 MG tablet Take 0.5 tablets (12.5 mg total) by mouth 3 (three) times daily. 135 tablet 3  . Melatonin 3 MG TABS Take 3 mg by mouth at bedtime.    . metoprolol succinate (TOPROL-XL) 50 MG 24 hr tablet Take 50 mg by mouth in the morning and at  bedtime.     Marland Kitchen omeprazole (PRILOSEC) 40 MG capsule Take 40 mg by mouth daily.    . potassium chloride SA (KLOR-CON) 20 MEQ tablet Take 1 tablet (20 mEq total) by mouth daily. (Patient taking differently: Take 20 mEq by mouth daily at 12 noon. ) 2 tablet 0  . predniSONE (DELTASONE) 20 MG tablet 4 tablet p.o. daily for 1 week then 3 tablet p.o. daily for 1 week then 2 tablet p.o. daily for 1 week then 1 tablet p.o. daily for 1 week until I see the patient. 70 tablet 0  . promethazine (PHENERGAN) 25 MG tablet Take 25 mg by mouth every 6 (six) hours as needed for nausea or vomiting.    . sacubitril-valsartan (ENTRESTO) 24-26 MG Take 1 tablet by mouth 2 (two) times daily. 60 tablet 5  . senna (SENOKOT) 8.6 MG TABS tablet Take 1 tablet (8.6 mg total) by mouth 2 (two) times daily. (Patient taking differently: Take 1 tablet by mouth at bedtime. ) 120 each 0  . spironolactone (ALDACTONE) 25 MG  tablet TAKE 1/2 TABLET BY MOUTH EVERY DAY 45 tablet 3  . sucralfate (CARAFATE) 1 g tablet Take 1 tablet (1 g total) by mouth 4 (four) times daily -  with meals and at bedtime. 5 min before meals for radiation induced esophagitis (Patient taking differently: Take 1 g by mouth 4 (four) times daily. 5 min before meals for radiation induced esophagitis) 120 tablet 2  . tiZANidine (ZANAFLEX) 4 MG capsule Take 4 mg by mouth 3 (three) times daily as needed for muscle spasms.       Allergies as of 11/14/2019 - Review Complete 10/30/2019  Allergen Reaction Noted  . Penicillins Nausea And Vomiting, Swelling, and Other (See Comments) 05/17/2011     Review of Systems:    Constitutional: No weight loss, fever or chills Skin: No rash  Cardiovascular: No chest pain Respiratory: +DOE Gastrointestinal: See HPI and otherwise negative Genitourinary: No dysuria  Neurological: No headache Musculoskeletal: No new muscle or joint pain Hematologic: No bleeding or bruising Psychiatric: No history of depression or anxiety     Physical Exam:  Vital signs in last 24 hours: Temp:  [97.9 F (36.6 C)] 97.9 F (36.6 C) (05/25 1122) Pulse Rate:  [77-81] 77 (05/25 1257) Resp:  [16] 16 (05/25 1257) BP: (99-103)/(46-52) 99/46 (05/25 1257) SpO2:  [100 %] 100 % (05/25 1257) Weight:  [91.2 kg] 91.2 kg (05/25 1122)   General:   Pleasant AA male appears to be in NAD, Well developed, Well nourished, alert and cooperative Head:  Normocephalic and atraumatic. Eyes:   PEERL, EOMI. No icterus. Conjunctiva pink. Ears:  Normal auditory acuity. Neck:  Supple Throat: Oral cavity and pharynx without inflammation, swelling or lesion.  Lungs: Respirations even and unlabored. Lungs clear to auscultation bilaterally.   No wheezes, crackles, or rhonchi.  Heart: Normal S1, S2. No MRG. Regular rate and rhythm. No peripheral edema, cyanosis or pallor.  Abdomen:  Soft, nondistended, nontender. No rebound or guarding. Normal bowel sounds. No appreciable masses or hepatomegaly. Rectal:  Not performed.  Msk:  Symmetrical without gross deformities. Peripheral pulses intact.  Extremities:  Without edema, no deformity or joint abnormality.  Neurologic:  Alert and  oriented x4;  grossly normal neurologically.  Skin:   Dry and intact without significant lesions or rashes. Psychiatric: Demonstrates good judgement and reason without abnormal affect or behaviors.   LAB RESULTS: Recent Labs    11/14/19 1136  WBC 5.9  HGB 6.5*  HCT 22.9*  PLT 238   BMET Recent Labs    11/14/19 1136  NA 137  K 3.4*  CL 105  CO2 24  GLUCOSE 121*  BUN 34*  CREATININE 1.74*  CALCIUM 9.5   LFT Recent Labs    11/14/19 1309  PROT 6.5  ALBUMIN 3.1*  AST 29  ALT 33  ALKPHOS 65  BILITOT 0.4  BILIDIR <0.1  IBILI NOT CALCULATED    STUDIES: DG Chest Portable 1 View  Result Date: 11/14/2019 CLINICAL DATA:  Anemia EXAM: PORTABLE CHEST 1 VIEW COMPARISON:  September 08, 2019 chest CT; chest radiograph July 01, 2019. FINDINGS: There is extensive fibrosis  in the left perihilar region, and to a lesser extent in the right perihilar region. There is no frank edema or consolidation. Note that a portion of the left upper lobe is obscured by a pacemaker device. Pacemaker lead is attached to the right ventricle. Heart is upper normal in size. There is distortion of pulmonary vascularity due to fibrosis bilaterally. Patient is status post coronary artery  bypass grafting. No adenopathy is appreciable by radiography. There is aortic atherosclerosis. No bone lesions. IMPRESSION: Perihilar fibrosis bilaterally, more severe on the left than on the right. No edema or consolidation evident. Heart upper normal in size. No adenopathy appreciable by radiography. Pacer lead tip in right ventricle. Aortic Atherosclerosis (ICD10-I70.0). Electronically Signed   By: Lowella Grip III M.D.   On: 11/14/2019 14:52   VAS [place] ABI WITH/WO TBI  Result Date: 11/13/2019 LOWER EXTREMITY DOPPLER STUDY Indications: Claudication, and today patient denies any pain in his legs when              walking. His chief complaint today is increase swelling and pain in              the left foot x 2 months. The swelling has dissipated, but the pain              remains at the ankles. He also c/o of tingling sensations in both              feet and intermittent "shooting" pain that start at the heel to the              toes, left worse than right. Patient does state he has a history of              gout. High Risk Factors: Hypertension, past history of smoking, coronary artery                    disease. Other Factors: CABG x 5, COPD.  Comparison Study: In 05/2018, an arterial Doppler showed an ABI of .75 on the                   right and .70 on the left. Performing Technologist: Sharlett Iles RVT  Examination Guidelines: A complete evaluation includes at minimum, Doppler waveform signals and systolic blood pressure reading at the level of bilateral brachial, anterior tibial, and posterior tibial  arteries, when vessel segments are accessible. Bilateral testing is considered an integral part of a complete examination. Photoelectric Plethysmograph (PPG) waveforms and toe systolic pressure readings are included as required and additional duplex testing as needed. Limited examinations for reoccurring indications may be performed as noted.  ABI Findings: +---------+------------------+-----+----------+--------+ Right    Rt Pressure (mmHg)IndexWaveform  Comment  +---------+------------------+-----+----------+--------+ Brachial 115                                       +---------+------------------+-----+----------+--------+ ATA      74                0.63 monophasic         +---------+------------------+-----+----------+--------+ PTA      83                0.70 monophasic         +---------+------------------+-----+----------+--------+ PERO     74                0.63 monophasic         +---------+------------------+-----+----------+--------+ Great Toe48                0.41 Abnormal           +---------+------------------+-----+----------+--------+ +---------+------------------+-----+----------+-------+ Left     Lt Pressure (mmHg)IndexWaveform  Comment +---------+------------------+-----+----------+-------+ Brachial 118                                      +---------+------------------+-----+----------+-------+  ATA      81                0.69 monophasic        +---------+------------------+-----+----------+-------+ PTA      77                0.65 monophasic        +---------+------------------+-----+----------+-------+ PERO     56                0.47 monophasic        +---------+------------------+-----+----------+-------+ Great Toe54                0.46 Abnormal          +---------+------------------+-----+----------+-------+ +-------+-----------+-----------+------------+------------+ ABI/TBIToday's ABIToday's TBIPrevious ABIPrevious TBI  +-------+-----------+-----------+------------+------------+ Right  .70        .41        .75         .18          +-------+-----------+-----------+------------+------------+ Left   .69        .46        .70         .26          +-------+-----------+-----------+------------+------------+ Bilateral ABIs appear essentially unchanged compared to prior study on 06/07/2018. Bilateral TBIs appear increased compared to prior study on 06/07/2018.  Summary: Right: Resting right ankle-brachial index indicates moderate right lower extremity arterial disease. The right toe-brachial index is abnormal. TBIs increased by .23. Left: Resting left ankle-brachial index indicates moderate left lower extremity arterial disease. The left toe-brachial index is abnormal. TBIs increased by .20.  *See table(s) above for measurements and observations.     Preliminary      PREVIOUS ENDOSCOPIES:            none   Impression / Plan:   Impression: 1.  Anemia: Hemoglobin 6.5, MCV low normal at 82.7, no overt bleeding; consider GI source versus other 2.  Chronic anticoagulation: On Eliquis 3.  Hypokalemia: Potassium 3.4 4.  Heart failure: EF 25-30% on 09/01/2019  Plan: 1.  Plan is for an EGD and colonoscopy on Thursday, 11/16/2019 with Dr. Rush Landmark.  Did discuss risks, benefits, limitations and alternatives and patient agrees to proceed. 2.  Patient will be on a clear liquid diet now until 11/16/2019 midnight at which point he will be n.p.o. 3.  Patient will start movi prep tomorrow afternoon in split dose fashion. 4.  Agree with 2 units PRBCs and monitoring of hemoglobin with transfusion as needed less than 7 5.  Hold Eliquis 6. Will need correction of potassium in preparation for procedures 7.  Please await any further recommendations from Dr. Rush Landmark later today.  Thank you for your kind consultation, we will continue to follow.  Lavone Nian Konrad Hoak  11/14/2019, 3:24 PM

## 2019-11-14 NOTE — H&P (Signed)
History and Physical    [name] [name]. ZRA:[id number] DOB: 1951/03/20 DOA: 11/14/2019  Referring MD/NP/PA: Franchot Heidelberg, PA-C PCP: [name], PA-C  Patient coming from: Home  Chief Complaint: Abnormal lab work  I have personally briefly reviewed patient's old medical records in [place]   HPI: [name] [name]. is a 69 y.o. male with medical history significant of HTN, HLD, stage III non-small cell lung cancer, combined systolic and diastolic CHF last EF noted to be 25-30%, CAD s/p 3V CABG, s/p Saint Jude AICD, PAF, PAD, history of tobacco abuse, And GERD presents after having normal lab work instructed by his primary care provider to come into the hospital.  Yesterday, patient had a routine follow-up visit with his primary care provider and basic labs were obtained. Over the last few days he notes that he said some mild weakness,  leg swelling, and constipation.  He chronically has shortness of breath that he reports is unchanged.  Patient denies having any blood in stools, chest pain, lightheadedness, abdominal pain, diarrhea, or recent NSAID use.  Records notes that he also had to be transfused blood products again in February of this year.  He is on blood thinners with Eliquis.  Patient does not recall if he has however had a colonoscopy or not.  He states that he has completed radiation treatment for lung cancer.  ED Course: Upon admission into the emergency department patient was seen to have blood pressures as low as 99/46 other vital signs maintained. Labs significant for hemoglobin 6.5(previously 10.7 on 3/19), BUN 34, and creatinine 1.74. Stool guaiacs were noted to be positive. Patient was typed and screened and ordered 2 units of packed red blood cells. TRH called to admit.  Review of Systems  Constitutional: Negative for fever and weight loss.  HENT: Negative for congestion and nosebleeds.   Eyes: Negative for pain.  Respiratory: Positive for shortness of breath.     Cardiovascular: Positive for leg swelling. Negative for chest pain.  Gastrointestinal: Positive for constipation. Negative for abdominal pain, blood in stool, nausea and vomiting.  Genitourinary: Negative for dysuria and hematuria.  Musculoskeletal: Positive for myalgias. Negative for falls.  Skin: Negative for rash.  Neurological: Positive for weakness. Negative for loss of consciousness.  Endo/Heme/Allergies: Negative for polydipsia. Does not bruise/bleed easily.  Psychiatric/Behavioral: Negative for memory loss and substance abuse.    Past Medical History:  Diagnosis Date  . AICD (automatic cardioverter/defibrillator) present    reports no shocks  - st Jude  . Ambulates with cane   . CAD (coronary artery disease)    a. 04/2013 Cath: LM 10, LAD 40p, 42m, D1 50p, LCX 60p, 41m/100m, OM1 50, OM2 100 L-L collats, RCA 100p;  b. CABG x 5: LIMA->LAD, VG->D2, VG->RI->OM2, VG->PDA.  . Cataracts, bilateral   . Chronic systolic CHF (congestive heart failure) ([place])    a. 04/2013 Echo: EF 15-20%, diff HK, sev HK of inf and ant myocardium, dilated LA,  b. EF 20-25% with restrictive filling pattern, mod RV dysfx, mild MR (10/10/2013);  c.  Echo 12/15: EF 25-30%  . COPD (chronic obstructive pulmonary disease) ([place])   . Diabetes mellitus without complication ([place])    unsure what type but was dx as an adult, takes no meds, check his cbg at home 2 times a week   . Emphysema   . GERD (gastroesophageal reflux disease)   . Gout   . Hyperlipidemia   . Hypertension   . Ischemic cardiomyopathy   .  lung ca   . PVD (peripheral vascular disease) ([place])    a. (10/2013) ABIs: RIGHT 0.63, Waveforms: monophasic;  LEFT 0.78, Waveforms: monophasic  . Tobacco abuse    30+ pack-year history  . Transaminitis    a. 04/2013 Abd U/S and CT unremarkable, hepatitis panel neg -->felt to be 2/2 acute R heart failure.    Past Surgical History:  Procedure Laterality Date  . APPENDECTOMY  as a child  . CARDIAC  CATHETERIZATION  04/08/2015   Procedure: Left Heart Cath and Cors/Grafts Angiography;  Surgeon: Peter M Martinique, MD;  Location: [facility];  Service: Cardiovascular;;  . COLONOSCOPY    . CORONARY ARTERY BYPASS GRAFT N/A 05/05/2013   Procedure: CORONARY ARTERY BYPASS GRAFTING (CABG) TIMES FIVE USING LEFT INTERNAL MAMMARY ARTERY AND RIGHT AND LEFT SAPHENOUS LEG VEIN HARVESTED ENDOSCOPICALLY;  Surgeon: Melrose Nakayama, MD;  Location: [facility];  Service: Open Heart Surgery;  Laterality: N/A;  . IMPLANTABLE CARDIOVERTER DEFIBRILLATOR IMPLANT N/A 07/06/2014   Procedure: St Jude IMPLANTABLE CARDIOVERTER DEFIBRILLATOR IMPLANT;  Surgeon: Deboraha Sprang, MD;  Location[facility] CATH LAB;  Service: Cardiovascular;  Laterality: N/A;  . LEFT AND RIGHT HEART CATHETERIZATION WITH CORONARY ANGIOGRAM N/A 04/28/2013   Procedure: LEFT AND RIGHT HEART CATHETERIZATION WITH CORONARY ANGIOGRAM;  Surgeon: Burnell Blanks, MD;  Location: [facility] CATH LAB;  Service: Cardiovascular;  Laterality: N/A;  . MULTIPLE EXTRACTIONS WITH ALVEOLOPLASTY N/A 05/03/2013   Procedure: Extraction of tooth #s 3,4,5,6,8,9,27 with alveoloplast and maxillary left osseous tuberosity reduction;  Surgeon: Lenn Cal, DDS;  Location: [place];  Service: Oral Surgery;  Laterality: N/A;  . TEE WITHOUT CARDIOVERSION N/A 09/01/2019   Procedure: TRANSESOPHAGEAL ECHOCARDIOGRAM (TEE);  Surgeon: Larey Dresser, MD;  Location: [facility] ENDOSCOPY;  Service: Cardiovascular;  Laterality: N/A;  . TOTAL HIP ARTHROPLASTY Right 11/30/2018   Procedure: TOTAL HIP ARTHROPLASTY ANTERIOR APPROACH;  Surgeon: Rod Can, MD;  Location: [facility];  Service: Orthopedics;  Laterality: Right;  Marland Kitchen VIDEO BRONCHOSCOPY WITH ENDOBRONCHIAL ULTRASOUND N/A 04/20/2019   Procedure: VIDEO BRONCHOSCOPY WITH ENDOBRONCHIAL ULTRASOUND;  Surgeon: Margaretha Seeds, MD;  Location: [place];  Service: Thoracic;  Laterality: N/A;     reports that he quit smoking about 7 months ago. His smoking  use included cigarettes. He started smoking about 52 years ago. He has a 60.00 pack-year smoking history. He has never used smokeless tobacco. He reports previous alcohol use. He reports current drug use. Drug: Marijuana.  Allergies  Allergen Reactions  . Penicillins Nausea And Vomiting, Swelling and Other (See Comments)    Per Dr Audria Nine Via phone 12/27/12 0400 SWELLING REACTION UNSPECIFIED   Did it involve swelling of the face/tongue/throat, SOB, or low BP? Unknown Did it involve sudden or severe rash/hives, skin peeling, or any reaction on the inside of your mouth or nose? Unknown Did you need to seek medical attention at a hospital or doctor's office? Unknown When did it last happen?Childhood If all above answers are "NO", may proceed with cephalosporin use.    Family History  Problem Relation Age of Onset  . Diabetes Mother   . Hypertension Father   . Hypertension Other   . Heart attack Other        Brothers  . Diabetes Brother   . Heart attack Brother   . Heart attack Brother   . Stroke Neg Hx     Prior to Admission medications   Medication Sig Start Date End Date Taking? Authorizing Provider  acetaminophen (TYLENOL) 325 MG  tablet Take by mouth every 6 (six) hours as needed for mild pain or moderate pain.    [provider]  albuterol (ACCUNEB) 1.25 MG/3ML nebulizer solution Take 1 ampule by nebulization every 6 (six) hours as needed for wheezing.    [provider]  albuterol (VENTOLIN HFA) 108 (90 Base) MCG/ACT inhaler Inhale 1-2 puffs into the lungs every 6 (six) hours as needed for wheezing or shortness of breath. 04/05/19   Margaretha Seeds, MD  allopurinol (ZYLOPRIM) 300 MG tablet Take 300 mg by mouth daily.    [provider]  amiodarone (PACERONE) 400 MG tablet Take 0.5 tablets (200 mg total) by mouth daily. Patient taking differently: Take 400 mg by mouth 2 (two) times daily.  07/28/19   Larey Dresser, MD  atorvastatin  (LIPITOR) 20 MG tablet Take 20 mg by mouth daily.    [provider]  azelastine (OPTIVAR) 0.05 % ophthalmic solution Place 1 drop into the left eye 2 (two) times daily.  04/21/18   [provider]  b complex vitamins tablet Take 1 tablet by mouth at bedtime.     [provider]  B Complex-C-Folic Acid (RENA-VITE RX) 1 MG TABS Take 1 tablet by mouth daily. 07/24/19   [provider]  chlorproMAZINE (THORAZINE) 25 MG tablet Take 1 tablet (25 mg total) by mouth 3 (three) times daily as needed. Patient taking differently: Take 25 mg by mouth 3 (three) times daily.  08/22/19   Curt Bears, MD  Cholecalciferol (VITAMIN D3) 25 MCG (1000 UT) CAPS Take 1,000 Units by mouth daily at 12 noon.     [provider]  ELIQUIS 5 MG TABS tablet Take 5 mg by mouth 2 (two) times daily.  07/20/19   [provider]  furosemide (LASIX) 40 MG tablet Take 1 tablet (40 mg total) by mouth daily. 09/29/19   Larey Dresser, MD  guaiFENesin-dextromethorphan [facility] DM) 100-10 MG/5ML syrup Take 5 mLs by mouth every 4 (four) hours as needed for cough.    [provider]  hydrALAZINE (APRESOLINE) 25 MG tablet Take 0.5 tablets (12.5 mg total) by mouth 3 (three) times daily. 10/02/19   Larey Dresser, MD  Melatonin 3 MG TABS Take 3 mg by mouth at bedtime.    [provider]  metoprolol succinate (TOPROL-XL) 50 MG 24 hr tablet Take 50 mg by mouth in the morning and at bedtime.  07/20/19   [provider]  omeprazole (PRILOSEC) 40 MG capsule Take 40 mg by mouth daily.    [provider]  potassium chloride SA (KLOR-CON) 20 MEQ tablet Take 1 tablet (20 mEq total) by mouth daily. Patient taking differently: Take 20 mEq by mouth daily at 12 noon.  08/03/19   Fredia Sorrow, MD  predniSONE (DELTASONE) 20 MG tablet 4 tablet p.o. daily for 1 week then 3 tablet p.o. daily for 1 week then 2 tablet p.o. daily for 1 week then 1 tablet p.o. daily for 1  week until I see the patient. 08/14/19   Curt Bears, MD  promethazine (PHENERGAN) 25 MG tablet Take 25 mg by mouth every 6 (six) hours as needed for nausea or vomiting.    [provider]  sacubitril-valsartan (ENTRESTO) 24-26 MG Take 1 tablet by mouth 2 (two) times daily. 09/29/19   Larey Dresser, MD  senna (SENOKOT) 8.6 MG TABS tablet Take 1 tablet (8.6 mg total) by mouth 2 (two) times daily. Patient taking differently: Take 1 tablet by  mouth at bedtime.  12/01/18   Swinteck, Aaron Edelman, MD  spironolactone (ALDACTONE) 25 MG tablet TAKE 1/2 TABLET BY MOUTH EVERY DAY 10/30/19   Larey Dresser, MD  sucralfate (CARAFATE) 1 g tablet Take 1 tablet (1 g total) by mouth 4 (four) times daily -  with meals and at bedtime. 5 min before meals for radiation induced esophagitis Patient taking differently: Take 1 g by mouth 4 (four) times daily. 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     [provider]    Physical Exam:  Constitutional: Elderly male who appears to be in no acute distress at this time. Vitals:   11/14/19 1122 11/14/19 1257  BP: (!) 103/52 (!) 99/46  Pulse: 81 77  Resp: 16 16  Temp: 97.9 F (36.6 C)   TempSrc: Oral   SpO2: 100% 100%  Weight: 91.2 kg   Height: 5\' 7"  (1.702 m)    Eyes: PERRL, lids and conjunctivae normal ENMT: Mucous membranes are moist. Posterior pharynx clear of any exudate or lesions.  Neck: normal, supple, no masses, no thyromegaly Respiratory: clear to auscultation bilaterally, no wheezing, no crackles. Normal respiratory effort. No accessory muscle use.  Cardiovascular: Regular rate and rhythm, no murmurs / rubs / gallops.  Trace lower extremity edema. 2+ pedal pulses. No carotid bruits.  Abdomen: no tenderness, no masses palpated. No hepatosplenomegaly. Bowel sounds positive.    Musculoskeletal: no clubbing / cyanosis. No joint deformity upper and lower extremities. Good ROM, no contractures. Normal muscle tone.  Skin: no rashes, lesions, ulcers. No induration Neurologic: CN 2-12 grossly intact. Sensation intact, DTR normal. Strength 5/5 in all 4.  Psychiatric: Normal judgment and insight. Alert and oriented x 3. Normal mood.     Labs on Admission: I have personally reviewed following labs and imaging studies  CBC: Recent Labs  Lab 11/14/19 1136  WBC 5.9  HGB 6.5*  HCT 22.9*  MCV 82.7  PLT 161   Basic Metabolic Panel: Recent Labs  Lab 11/14/19 1136  NA 137  K 3.4*  CL 105  CO2 24  GLUCOSE 121*  BUN 34*  CREATININE 1.74*  CALCIUM 9.5   GFR: Estimated Creatinine Clearance: 43.7 mL/min (A) (by C-G formula based on SCr of 1.74 mg/dL (H)). Liver Function Tests: Recent Labs  Lab 11/14/19 1309  AST 29  ALT 33  ALKPHOS 65  BILITOT 0.4  PROT 6.5  ALBUMIN 3.1*   No results for input(s): LIPASE, AMYLASE in the last 168 hours. No results for input(s): AMMONIA in the last 168 hours. Coagulation Profile: No results for input(s): INR, PROTIME in the last 168 hours. Cardiac Enzymes: No results for input(s): CKTOTAL, CKMB, CKMBINDEX, TROPONINI in the last 168 hours. BNP (last 3 results) No results for input(s): PROBNP in the last 8760 hours. HbA1C: No results for input(s): HGBA1C in the last 72 hours. CBG: No results for input(s): GLUCAP in the last 168 hours. Lipid Profile: No results for input(s): CHOL, HDL, LDLCALC, TRIG, CHOLHDL, LDLDIRECT in the last 72 hours. Thyroid Function Tests: No results for input(s): TSH, T4TOTAL, FREET4, T3FREE, THYROIDAB in the last 72 hours. Anemia Panel: No results for input(s): VITAMINB12, FOLATE, FERRITIN, TIBC, IRON, RETICCTPCT in the last 72 hours. Urine analysis:    Component Value Date/Time   COLORURINE YELLOW 02/14/2019 [place] 02/14/2019 1443  LABSPEC 1.010 02/14/2019 1443    PHURINE 5.0 02/14/2019 1443   GLUCOSEU NEGATIVE 02/14/2019 1443   HGBUR NEGATIVE 02/14/2019 1443   BILIRUBINUR NEGATIVE 02/14/2019 1443   KETONESUR NEGATIVE 02/14/2019 1443   PROTEINUR NEGATIVE 02/14/2019 1443   UROBILINOGEN 4.0 (H) 05/04/2013 2202   NITRITE NEGATIVE 02/14/2019 1443   LEUKOCYTESUR NEGATIVE 02/14/2019 1443   Sepsis Labs: No results found for this or any previous visit (from the past 240 hour(s)).   Radiological Exams on Admission: VAS [place] ABI WITH/WO TBI  Result Date: 11/13/2019 LOWER EXTREMITY DOPPLER STUDY Indications: Claudication, and today patient denies any pain in his legs when              walking. His chief complaint today is increase swelling and pain in              the left foot x 2 months. The swelling has dissipated, but the pain              remains at the ankles. He also c/o of tingling sensations in both              feet and intermittent "shooting" pain that start at the heel to the              toes, left worse than right. Patient does state he has a history of              gout. High Risk Factors: Hypertension, past history of smoking, coronary artery                    disease. Other Factors: CABG x 5, COPD.  Comparison Study: In 05/2018, an arterial Doppler showed an ABI of .75 on the                   right and .70 on the left. Performing Technologist: Sharlett Iles RVT  Examination Guidelines: A complete evaluation includes at minimum, Doppler waveform signals and systolic blood pressure reading at the level of bilateral brachial, anterior tibial, and posterior tibial arteries, when vessel segments are accessible. Bilateral testing is considered an integral part of a complete examination. Photoelectric Plethysmograph (PPG) waveforms and toe systolic pressure readings are included as required and additional duplex testing as needed. Limited examinations for reoccurring indications may be performed as noted.  ABI Findings:  +---------+------------------+-----+----------+--------+ Right    Rt Pressure (mmHg)IndexWaveform  Comment  +---------+------------------+-----+----------+--------+ Brachial 115                                       +---------+------------------+-----+----------+--------+ ATA      74                0.63 monophasic         +---------+------------------+-----+----------+--------+ PTA      83                0.70 monophasic         +---------+------------------+-----+----------+--------+ PERO     74                0.63 monophasic         +---------+------------------+-----+----------+--------+ Great Toe48                0.41 Abnormal           +---------+------------------+-----+----------+--------+ +---------+------------------+-----+----------+-------+ Left     Lt Pressure (mmHg)IndexWaveform  Comment +---------+------------------+-----+----------+-------+  Brachial 118                                      +---------+------------------+-----+----------+-------+ ATA      81                0.69 monophasic        +---------+------------------+-----+----------+-------+ PTA      77                0.65 monophasic        +---------+------------------+-----+----------+-------+ PERO     56                0.47 monophasic        +---------+------------------+-----+----------+-------+ Great Toe54                0.46 Abnormal          +---------+------------------+-----+----------+-------+ +-------+-----------+-----------+------------+------------+ ABI/TBIToday's ABIToday's TBIPrevious ABIPrevious TBI +-------+-----------+-----------+------------+------------+ Right  .70        .41        .75         .18          +-------+-----------+-----------+------------+------------+ Left   .69        .46        .70         .26          +-------+-----------+-----------+------------+------------+ Bilateral ABIs appear essentially unchanged compared to  prior study on 06/07/2018. Bilateral TBIs appear increased compared to prior study on 06/07/2018.  Summary: Right: Resting right ankle-brachial index indicates moderate right lower extremity arterial disease. The right toe-brachial index is abnormal. TBIs increased by .23. Left: Resting left ankle-brachial index indicates moderate left lower extremity arterial disease. The left toe-brachial index is abnormal. TBIs increased by .20.  *See table(s) above for measurements and observations.     Preliminary     EKG: Independently reviewed.  Sinus rhythm at 82 bpm with QTc 518 and right bundle branch block.  Assessment/Plan  Symptomatic anemia, GI bleed: Acute.  Patient presented with hemoglobin 6.5.  Patient denies any reports of bleeding.  Stool guaiacs were positive and he does not recall if he has ever had a colonoscopy before.  His factors include Eliquis for atrial fibrillation. -Admit to a medical telemetry bed -Clear liquid diet as tolerated -Continue with transfusion of 2 units of packed red blood cell -Give 1 dose of Lasix 40 mg IV -Continue to monitor H&H and transfuse blood products as needed -Appreciate GI consultative services, we will follow-up for further recommendation  Hypokalemia: Acute.  Initial potassium is mildly low at 3.4. -Give 40 medical events of potassium chloride x1 dose now -Continue to monitor and replace as needed  Combined systolic congestive heart failure: Patient does not appear grossly fluid overloaded at this time.  Last EF noted to be around 25-30%. -Strict intake and output -Daily weights   -Giving 1 dose of IV Lasix as seen above after first unit of blood, and will resume home furosemide p.o. dose in a.m.  Prolonged QT interval: QTC noted to be elevated at 518. -Correct electrolyte abnormalities -Limiting QT prolonging medication   Possibly acute kidney injury superimposed on chronic kidney disease stage IIIa: Creatinine 1.74 which appears near previous  baseline over the last 2 months.  However, kidney function had been lower earlier this year.  Unclear if bleeding issues led to worsening function. -Recheck kidney function in a.m.  Paroxysmal  atrial fibrillation on chronic anticoagulation: Patient currently appears to be in sinus rhythm. -Hold Eliquis -Continue amiodarone  Essential hypertension: Blood pressures currently stable after starting blood transfusion.  Home blood pressure medications include amiodarone 100 mg daily, furosemide 40 mg daily, hydralazine 12.5 mg 3 times daily, metoprolol succinate 50 mg twice daily, Entresto 24-26 mg twice daily -Restart most home blood pressure medications in a.m.  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 -Continue home inhalers  History of lung cancer: Patient on the outpatient setting by Dr. Julien Nordmann.  He reports recently completing radiation therapy. -Continue outpatient follow-up  Peripheral vascular disease: TBI noted to be relatively unchanged from previous values in 2019. -Continue statin  GERD -Continue Carafate -PPI held due to prolonged QT DVT prophylaxis: SCD Code Status: Full Family Communication: Unable to get a hold of patient's sister at this time. Disposition Plan: Possible discharge home in 1 to 2 days Consults called: GI Admission status: Observation  Norval Morton MD [facility] Pager [phone]   If 7PM-7AM, please contact night-coverage [email or web address] Password [facility]  11/14/2019, 2:54 PM

## 2019-11-14 NOTE — ED Provider Notes (Signed)
[facility] EMERGENCY DEPARTMENT Provider Note   CSN: [id number] Arrival date & time: 11/14/19  1121     History Chief Complaint  Patient presents with   Abnormal Lab    [name] Wich. is a 69 y.o. male presenting to the ER due to abnormal blood work by his PCP.  Patient states he had a PCP routine checkup was called to come to the ER for abnormal blood work.  He does not know what was abnormal.  He states over the past several days, he has been feeling more tired than normal.  He also reports he is very dyspneic on exertion.  No orthopnea.  He denies recent medication changes.  He denies fevers, chills, nosebleeds, mouth, cough, chest pain, nausea, vomiting, abdominal pain, urinary symptoms, abnormal bowel movements.  He denies melena or hematochezia.  He states he has never been seen by GI doctor, has never had a colonoscopy or EGD.  He is on Eliquis, has been taking it as prescribed.  Patient does not believe he has ever had any issues with low blood counts, states he is not on iron pills, however per chart review patient was in the ER in February 2021 for anemia of 8.4.  Additional history obtained from chart review.  Patient with a history of hypertension, hyperlipidemia, GERD, COPD, diabetes, CHF, CAD with AICD present, chronic anemia, A. fib, lung cancer not currently receiving chemo/radiation (obvs only at this time).   HPI     Past Medical History:  Diagnosis Date   AICD (automatic cardioverter/defibrillator) present    reports no shocks  - st Jude   Ambulates with cane    CAD (coronary artery disease)    a. 04/2013 Cath: LM 10, LAD 40p, 79m, D1 50p, LCX 60p, 22m/100m, OM1 50, OM2 100 L-L collats, RCA 100p;  b. CABG x 5: LIMA->LAD, VG->D2, VG->RI->OM2, VG->PDA.   Cataracts, bilateral    Chronic systolic CHF (congestive heart failure) ([place])    a. 04/2013 Echo: EF 15-20%, diff HK, sev HK of inf and ant myocardium, dilated LA,  b. EF 20-25% with  restrictive filling pattern, mod RV dysfx, mild MR (10/10/2013);  c.  Echo 12/15: EF 25-30%   COPD (chronic obstructive pulmonary disease) ([place])    Diabetes mellitus without complication ([place])    unsure what type but was dx as an adult, takes no meds, check his cbg at home 2 times a week    Emphysema    GERD (gastroesophageal reflux disease)    Gout    Hyperlipidemia    Hypertension    Ischemic cardiomyopathy    lung ca    PVD (peripheral vascular disease) ([place])    a. (10/2013) ABIs: RIGHT 0.63, Waveforms: monophasic;  LEFT 0.78, Waveforms: monophasic   Tobacco abuse    30+ pack-year history   Transaminitis    a. 04/2013 Abd U/S and CT unremarkable, hepatitis panel neg -->felt to be 2/2 acute R heart failure.    Patient Active Problem List   Diagnosis Date Noted   Anemia of chronic disease 08/09/2019   Altered mental status 07/25/2019   Peripheral neuropathy 07/25/2019   Dehydration 06/27/2019   Primary malignant neoplasm of bronchus of left upper lobe ([place]) 05/24/2019   Stage III squamous cell carcinoma of left lung ([place]) 04/27/2019   Encounter for antineoplastic chemotherapy 04/27/2019   Goals of care, counseling/discussion 04/27/2019   Avascular necrosis of bone of right hip ([place]) 11/30/2018   Avascular necrosis of hip, right (  [place]) 11/30/2018   Chest pain 04/08/2015   Abnormal nuclear stress test 04/08/2015   Ischemic cardiomyopathy 07/06/2014   Lumbago 03/13/2014   Special screening for malignant neoplasms, colon 02/09/2014   Normocytic anemia 02/09/2014   Pleural effusion on left 11/28/2013   Atherosclerosis of native arteries of extremity with intermittent claudication ([place]) 11/07/2013   Weakness of both legs 11/07/2013   Smoker 09/19/2013   Numbness in right leg/ Foot 06/14/2013   Pain in limb-Right Leg/foot 06/14/2013   Atherosclerosis of native arteries of the extremities with ulceration(440.23) 09/60/4540   Chronic systolic heart  failure ([place]) 05/29/2013   Atrial fibrillation ([place]) 05/29/2013   Chest pain, mid sternal 05/19/2013   S/P CABG x 5 05/08/2013   Right foot ulcer ([place]) 05/04/2013   Coronary atherosclerosis of native coronary artery 98/04/9146   Acute systolic heart failure ([place]) 04/28/2013   Abnormal blood chemistry 12/30/2012   Abnormal LFTs 12/30/2012   Aspiration pneumonia ([place]) 12/30/2012   Chronic congestive heart failure ([place]) 12/27/2012   Leg weakness 12/26/2012   Pneumonia 05/18/2011   Gout 05/17/2011   Respiratory distress, acute 05/17/2011   Hypertension 05/17/2011   COPD (chronic obstructive pulmonary disease) ([place]) 05/17/2011   CHF, acute ([place]) 05/17/2011    Past Surgical History:  Procedure Laterality Date   APPENDECTOMY  as a child   CARDIAC CATHETERIZATION  04/08/2015   Procedure: Left Heart Cath and Cors/Grafts Angiography;  Surgeon: Peter M Martinique, MD;  Location: [facility];  Service: Cardiovascular;;   COLONOSCOPY     CORONARY ARTERY BYPASS GRAFT N/A 05/05/2013   Procedure: CORONARY ARTERY BYPASS GRAFTING (CABG) TIMES FIVE USING LEFT INTERNAL MAMMARY ARTERY AND RIGHT AND LEFT SAPHENOUS LEG VEIN HARVESTED ENDOSCOPICALLY;  Surgeon: Melrose Nakayama, MD;  Location: [place];  Service: Open Heart Surgery;  Laterality: N/A;   IMPLANTABLE CARDIOVERTER DEFIBRILLATOR IMPLANT N/A 07/06/2014   Procedure: St Jude IMPLANTABLE CARDIOVERTER DEFIBRILLATOR IMPLANT;  Surgeon: Deboraha Sprang, MD;  Location: [facility] CATH LAB;  Service: Cardiovascular;  Laterality: N/A;   LEFT AND RIGHT HEART CATHETERIZATION WITH CORONARY ANGIOGRAM N/A 04/28/2013   Procedure: LEFT AND RIGHT HEART CATHETERIZATION WITH CORONARY ANGIOGRAM;  Surgeon: Burnell Blanks, MD;  Location: [facility] CATH LAB;  Service: Cardiovascular;  Laterality: N/A;   MULTIPLE EXTRACTIONS WITH ALVEOLOPLASTY N/A 05/03/2013   Procedure: Extraction of tooth #s 3,4,5,6,8,9,27 with alveoloplast and maxillary left osseous  tuberosity reduction;  Surgeon: Lenn Cal, DDS;  Location: [place];  Service: Oral Surgery;  Laterality: N/A;   TEE WITHOUT CARDIOVERSION N/A 09/01/2019   Procedure: TRANSESOPHAGEAL ECHOCARDIOGRAM (TEE);  Surgeon: Larey Dresser, MD;  Location: [name] ENDOSCOPY;  Service: Cardiovascular;  Laterality: N/A;   TOTAL HIP ARTHROPLASTY Right 11/30/2018   Procedure: TOTAL HIP ARTHROPLASTY ANTERIOR APPROACH;  Surgeon: Rod Can, MD;  Location: [facility] ORS;  Service: Orthopedics;  Laterality: Right;   VIDEO BRONCHOSCOPY WITH ENDOBRONCHIAL ULTRASOUND N/A 04/20/2019   Procedure: VIDEO BRONCHOSCOPY WITH ENDOBRONCHIAL ULTRASOUND;  Surgeon: Margaretha Seeds, MD;  Location: [place];  Service: Thoracic;  Laterality: N/A;       Family History  Problem Relation Age of Onset   Diabetes Mother    Hypertension Father    Hypertension Other    Heart attack Other        Brothers   Diabetes Brother    Heart attack Brother    Heart attack Brother    Stroke Neg Hx     Social History   Tobacco Use   Smoking status: Former  Smoker    Packs/day: 2.00    Years: 30.00    Pack years: 60.00    Types: Cigarettes    Start date: 31    Quit date: 03/23/2019    Years since quitting: 0.6   Smokeless tobacco: Never Used   Tobacco comment: Pt states now smokes - half pack per day 04/05/19  Substance Use Topics   Alcohol use: Not Currently   Drug use: Yes    Types: Marijuana    Comment: last use 04/17/19    Home Medications Prior to Admission medications   Medication Sig Start Date End Date Taking? Authorizing Provider  acetaminophen (TYLENOL) 325 MG tablet Take by mouth every 6 (six) hours as needed for mild pain or moderate pain.    [provider]  albuterol (ACCUNEB) 1.25 MG/3ML nebulizer solution Take 1 ampule by nebulization every 6 (six) hours as needed for wheezing.    [provider]  albuterol (VENTOLIN HFA) 108 (90 Base) MCG/ACT inhaler Inhale 1-2 puffs into the  lungs every 6 (six) hours as needed for wheezing or shortness of breath. 04/05/19   Margaretha Seeds, MD  allopurinol (ZYLOPRIM) 300 MG tablet Take 300 mg by mouth daily.    [provider]  amiodarone (PACERONE) 400 MG tablet Take 0.5 tablets (200 mg total) by mouth daily. Patient taking differently: Take 400 mg by mouth 2 (two) times daily.  07/28/19   Larey Dresser, MD  atorvastatin (LIPITOR) 20 MG tablet Take 20 mg by mouth daily.    [provider]  azelastine (OPTIVAR) 0.05 % ophthalmic solution Place 1 drop into the left eye 2 (two) times daily.  04/21/18   [provider]  b complex vitamins tablet Take 1 tablet by mouth at bedtime.     [provider]  B Complex-C-Folic Acid (RENA-VITE RX) 1 MG TABS Take 1 tablet by mouth daily. 07/24/19   [provider]  chlorproMAZINE (THORAZINE) 25 MG tablet Take 1 tablet (25 mg total) by mouth 3 (three) times daily as needed. Patient taking differently: Take 25 mg by mouth 3 (three) times daily.  08/22/19   Curt Bears, MD  Cholecalciferol (VITAMIN D3) 25 MCG (1000 UT) CAPS Take 1,000 Units by mouth daily at 12 noon.     [provider]  ELIQUIS 5 MG TABS tablet Take 5 mg by mouth 2 (two) times daily.  07/20/19   [provider]  furosemide (LASIX) 40 MG tablet Take 1 tablet (40 mg total) by mouth daily. 09/29/19   Larey Dresser, MD  guaiFENesin-dextromethorphan [facility] DM) 100-10 MG/5ML syrup Take 5 mLs by mouth every 4 (four) hours as needed for cough.    [provider]  hydrALAZINE (APRESOLINE) 25 MG tablet Take 0.5 tablets (12.5 mg total) by mouth 3 (three) times daily. 10/02/19   Larey Dresser, MD  Melatonin 3 MG TABS Take 3 mg by mouth at bedtime.    [provider]  metoprolol succinate (TOPROL-XL) 50 MG 24 hr tablet Take 50 mg by mouth in the morning and at bedtime.  07/20/19   [provider]  omeprazole (PRILOSEC) 40 MG capsule Take 40 mg by  mouth daily.    [provider]  potassium chloride SA (KLOR-CON) 20 MEQ tablet Take 1 tablet (20 mEq total) by mouth daily. Patient taking differently: Take 20 mEq by mouth daily at 12 noon.  08/03/19   Fredia Sorrow, MD  predniSONE (DELTASONE) 20 MG tablet 4 tablet p.o.  daily for 1 week then 3 tablet p.o. daily for 1 week then 2 tablet p.o. daily for 1 week then 1 tablet p.o. daily for 1 week until I see the patient. 08/14/19   Curt Bears, MD  promethazine (PHENERGAN) 25 MG tablet Take 25 mg by mouth every 6 (six) hours as needed for nausea or vomiting.    [provider]  sacubitril-valsartan (ENTRESTO) 24-26 MG Take 1 tablet by mouth 2 (two) times daily. 09/29/19   Larey Dresser, MD  senna (SENOKOT) 8.6 MG TABS tablet Take 1 tablet (8.6 mg total) by mouth 2 (two) times daily. Patient taking differently: Take 1 tablet by mouth at bedtime.  12/01/18   Swinteck, Aaron Edelman, MD  spironolactone (ALDACTONE) 25 MG tablet TAKE 1/2 TABLET BY MOUTH EVERY DAY 10/30/19   Larey Dresser, MD  sucralfate (CARAFATE) 1 g tablet Take 1 tablet (1 g total) by mouth 4 (four) times daily -  with meals and at bedtime. 5 min before meals for radiation induced esophagitis Patient taking differently: Take 1 g by mouth 4 (four) times daily. 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     [provider]    Allergies    Penicillins  Review of Systems   Review of Systems  Respiratory: Positive for shortness of breath.   Neurological: Positive for weakness.  Hematological: Bruises/bleeds easily.  All other systems reviewed and are negative.   Physical Exam Updated Vital Signs BP (!) 99/46 (BP Location: Right Arm)    Pulse 77    Temp 97.9 F (36.6 C) (Oral)    Resp 16    Ht 5\' 7"  (1.702 m)    Wt 91.2 kg    SpO2 100%    BMI 31.48 kg/m     Physical Exam Vitals and nursing note reviewed. Exam conducted with a chaperone present.  Constitutional:      General: He is not in acute distress.    Appearance: He is well-developed.     Comments: Appears chronically ill  HENT:     Head: Normocephalic and atraumatic.  Eyes:     Extraocular Movements: Extraocular movements intact.     Conjunctiva/sclera: Conjunctivae normal.     Pupils: Pupils are equal, round, and reactive to light.     Comments: Pale conjunctiva  Cardiovascular:     Rate and Rhythm: Normal rate and regular rhythm.     Pulses: Normal pulses.  Pulmonary:     Effort: Pulmonary effort is normal. No respiratory distress.     Breath sounds: Normal breath sounds. No wheezing.  Abdominal:     General: There is no distension.     Palpations: Abdomen is soft. There is no mass.     Tenderness: There is no abdominal tenderness. There is no guarding or rebound.  Genitourinary:    Comments: No gross blood or melena on rectal exam Musculoskeletal:        General: Normal range of motion.     Cervical back: Normal range of motion and neck supple.  Skin:    General: Skin is warm and dry.     Coloration: Skin is pale.  Neurological:     Mental Status: He is alert and oriented to person, place, and time.     ED Results / Procedures / Treatments   Labs (all  labs ordered are listed, but only abnormal results are displayed) Labs Reviewed  BASIC METABOLIC PANEL - Abnormal; Notable for the following components:      Result Value   Potassium 3.4 (*)    Glucose, Bld 121 (*)    BUN 34 (*)    Creatinine, Ser 1.74 (*)    GFR calc non Af Amer 39 (*)    GFR calc Af Amer 46 (*)    All other components within normal limits  CBC - Abnormal; Notable for the following components:   RBC 2.77 (*)    Hemoglobin 6.5 (*)    HCT 22.9 (*)    MCH 23.5 (*)    MCHC 28.4 (*)    RDW 19.1 (*)    All other components within normal limits  SARS CORONAVIRUS 2 BY RT PCR (HOSPITAL ORDER,  [place] [facility])  HEPATIC FUNCTION PANEL  POC OCCULT BLOOD, ED  TYPE AND SCREEN  PREPARE RBC (CROSSMATCH)    EKG None  Radiology VAS [place] ABI WITH/WO TBI  Result Date: 11/13/2019 LOWER EXTREMITY DOPPLER STUDY Indications: Claudication, and today patient denies any pain in his legs when              walking. His chief complaint today is increase swelling and pain in              the left foot x 2 months. The swelling has dissipated, but the pain              remains at the ankles. He also c/o of tingling sensations in both              feet and intermittent "shooting" pain that start at the heel to the              toes, left worse than right. Patient does state he has a history of              gout. High Risk Factors: Hypertension, past history of smoking, coronary artery                    disease. Other Factors: CABG x 5, COPD.  Comparison Study: In 05/2018, an arterial Doppler showed an ABI of .75 on the                   right and .70 on the left. Performing Technologist: Sharlett Iles RVT  Examination Guidelines: A complete evaluation includes at minimum, Doppler waveform signals and systolic blood pressure reading at the level of bilateral brachial, anterior tibial, and posterior tibial arteries, when vessel segments are accessible. Bilateral testing is considered an integral part of a complete examination. Photoelectric Plethysmograph (PPG) waveforms and toe systolic pressure readings are included as required and additional duplex testing as needed. Limited examinations for reoccurring indications may be performed as noted.  ABI Findings: +---------+------------------+-----+----------+--------+  Right     Rt Pressure (mmHg) Index Waveform   Comment   +---------+------------------+-----+----------+--------+  Brachial  115                                           +---------+------------------+-----+----------+--------+  ATA       74                 0.63  monophasic            +---------+------------------+-----+----------+--------+  PTA       83                 0.70  monophasic           +---------+------------------+-----+----------+--------+  PERO      74                 0.63  monophasic           +---------+------------------+-----+----------+--------+  Great Toe 48                 0.41  Abnormal             +---------+------------------+-----+----------+--------+ +---------+------------------+-----+----------+-------+  Left      Lt Pressure (mmHg) Index Waveform   Comment  +---------+------------------+-----+----------+-------+  Brachial  118                                          +---------+------------------+-----+----------+-------+  ATA       81                 0.69  monophasic          +---------+------------------+-----+----------+-------+  PTA       77                 0.65  monophasic          +---------+------------------+-----+----------+-------+  PERO      56                 0.47  monophasic          +---------+------------------+-----+----------+-------+  Great Toe 54                 0.46  Abnormal            +---------+------------------+-----+----------+-------+ +-------+-----------+-----------+------------+------------+  ABI/TBI Today's ABI Today's TBI Previous ABI Previous TBI  +-------+-----------+-----------+------------+------------+  Right   .70         .41         .75          .18           +-------+-----------+-----------+------------+------------+  Left    .69         .46         .70          .26           +-------+-----------+-----------+------------+------------+ Bilateral ABIs appear essentially unchanged compared to prior study on 06/07/2018. Bilateral TBIs appear increased compared to prior study on 06/07/2018.  Summary: Right: Resting right ankle-brachial index indicates moderate right lower extremity arterial disease. The right toe-brachial index is abnormal. TBIs increased by .23. Left: Resting left ankle-brachial index indicates moderate left lower  extremity arterial disease. The left toe-brachial index is abnormal. TBIs increased by .20.  *See table(s) above for measurements and observations.     Preliminary     Procedures .Critical Care Performed by: Franchot Heidelberg, PA-C Authorized by: Franchot Heidelberg, PA-C   Critical care provider statement:    Critical care time (minutes):  45   Critical care time was exclusive of:  Separately billable procedures and treating other patients and teaching time   Critical care was necessary to treat or prevent imminent or life-threatening deterioration of the following conditions:  Circulatory failure   Critical care was time spent personally by me on the following activities:  Blood draw for specimens, development of  treatment plan with patient or surrogate, evaluation of patient's response to treatment, examination of patient, obtaining history from patient or surrogate, ordering and performing treatments and interventions, ordering and review of laboratory studies, ordering and review of radiographic studies, pulse oximetry, re-evaluation of patient's condition and review of old charts   I assumed direction of critical care for this patient from another provider in my specialty: no   Comments:     Pt with critically low hgb requiring transfusion and admission to the hospital.    (including critical care time)  Medications Ordered in ED Medications  0.9 %  sodium chloride infusion (Manually program via Guardrails IV Fluids) (has no administration in time range)    ED Course  I have reviewed the triage vital signs and the nursing notes.  Pertinent labs & imaging results that were available during my care of the patient were reviewed by me and considered in my medical decision making (see chart for details).   MDM Rules/Calculators/A&P                      For evaluation of abnormal blood work.  On evaluation, patient appears chronically ill.  He reports no previous history of anemia, per  chart review he has had previous problems with anemia requiring transfusion.  He denies GI evaluation, consider intestinal source.  He is on Eliquis.  Lab work obtained in triage interpreted by me, shows critically low hemoglobin of 6.5 which will require transfusion and admission.  Exam without gross blood or hematuria, however fecal occult is positive, as such there may be a GI source.  Will call for admission to hospitalist.  Discussed with Dr. Tamala Julian from triad hospitalist service, patient to be admitted.  Final Clinical Impression(s) / ED Diagnoses Final diagnoses:  Symptomatic anemia    Rx / DC Orders ED Discharge Orders    None       Franchot Heidelberg, PA-C 11/14/19 1859    Virgel Manifold, MD 11/15/19 1203

## 2019-11-14 NOTE — ED Notes (Signed)
Tele Dinner ordered 

## 2019-11-14 NOTE — ED Triage Notes (Signed)
Pt bib ems for abnormal labs, pt went to PCP for routine check up, they called him back today and told him to come here, pt does not know which lab was abnormal. Pt has no complaints at this time.

## 2019-11-15 ENCOUNTER — Encounter (HOSPITAL_COMMUNITY): Payer: Self-pay | Admitting: Internal Medicine

## 2019-11-15 DIAGNOSIS — N1831 Chronic kidney disease, stage 3a: Secondary | ICD-10-CM | POA: Diagnosis present

## 2019-11-15 DIAGNOSIS — M109 Gout, unspecified: Secondary | ICD-10-CM | POA: Diagnosis present

## 2019-11-15 DIAGNOSIS — E785 Hyperlipidemia, unspecified: Secondary | ICD-10-CM | POA: Diagnosis present

## 2019-11-15 DIAGNOSIS — I130 Hypertensive heart and chronic kidney disease with heart failure and stage 1 through stage 4 chronic kidney disease, or unspecified chronic kidney disease: Secondary | ICD-10-CM | POA: Diagnosis present

## 2019-11-15 DIAGNOSIS — I480 Paroxysmal atrial fibrillation: Secondary | ICD-10-CM | POA: Diagnosis present

## 2019-11-15 DIAGNOSIS — D509 Iron deficiency anemia, unspecified: Secondary | ICD-10-CM

## 2019-11-15 DIAGNOSIS — K635 Polyp of colon: Secondary | ICD-10-CM | POA: Diagnosis not present

## 2019-11-15 DIAGNOSIS — Z20822 Contact with and (suspected) exposure to covid-19: Secondary | ICD-10-CM | POA: Diagnosis present

## 2019-11-15 DIAGNOSIS — D62 Acute posthemorrhagic anemia: Secondary | ICD-10-CM | POA: Diagnosis present

## 2019-11-15 DIAGNOSIS — E1151 Type 2 diabetes mellitus with diabetic peripheral angiopathy without gangrene: Secondary | ICD-10-CM | POA: Diagnosis present

## 2019-11-15 DIAGNOSIS — I5042 Chronic combined systolic (congestive) and diastolic (congestive) heart failure: Secondary | ICD-10-CM | POA: Diagnosis present

## 2019-11-15 DIAGNOSIS — J439 Emphysema, unspecified: Secondary | ICD-10-CM | POA: Diagnosis present

## 2019-11-15 DIAGNOSIS — I959 Hypotension, unspecified: Secondary | ICD-10-CM | POA: Diagnosis not present

## 2019-11-15 DIAGNOSIS — I255 Ischemic cardiomyopathy: Secondary | ICD-10-CM | POA: Diagnosis present

## 2019-11-15 DIAGNOSIS — K219 Gastro-esophageal reflux disease without esophagitis: Secondary | ICD-10-CM | POA: Diagnosis present

## 2019-11-15 DIAGNOSIS — J701 Chronic and other pulmonary manifestations due to radiation: Secondary | ICD-10-CM | POA: Diagnosis present

## 2019-11-15 DIAGNOSIS — E876 Hypokalemia: Secondary | ICD-10-CM | POA: Diagnosis present

## 2019-11-15 DIAGNOSIS — E1142 Type 2 diabetes mellitus with diabetic polyneuropathy: Secondary | ICD-10-CM | POA: Diagnosis present

## 2019-11-15 DIAGNOSIS — D631 Anemia in chronic kidney disease: Secondary | ICD-10-CM | POA: Diagnosis present

## 2019-11-15 DIAGNOSIS — E1122 Type 2 diabetes mellitus with diabetic chronic kidney disease: Secondary | ICD-10-CM | POA: Diagnosis present

## 2019-11-15 DIAGNOSIS — K228 Other specified diseases of esophagus: Secondary | ICD-10-CM | POA: Diagnosis not present

## 2019-11-15 DIAGNOSIS — F17210 Nicotine dependence, cigarettes, uncomplicated: Secondary | ICD-10-CM | POA: Diagnosis present

## 2019-11-15 DIAGNOSIS — K5900 Constipation, unspecified: Secondary | ICD-10-CM | POA: Diagnosis present

## 2019-11-15 DIAGNOSIS — D649 Anemia, unspecified: Secondary | ICD-10-CM | POA: Diagnosis present

## 2019-11-15 DIAGNOSIS — K5731 Diverticulosis of large intestine without perforation or abscess with bleeding: Secondary | ICD-10-CM | POA: Diagnosis present

## 2019-11-15 DIAGNOSIS — C3412 Malignant neoplasm of upper lobe, left bronchus or lung: Secondary | ICD-10-CM | POA: Diagnosis present

## 2019-11-15 DIAGNOSIS — K317 Polyp of stomach and duodenum: Secondary | ICD-10-CM | POA: Diagnosis present

## 2019-11-15 DIAGNOSIS — I2510 Atherosclerotic heart disease of native coronary artery without angina pectoris: Secondary | ICD-10-CM | POA: Diagnosis present

## 2019-11-15 DIAGNOSIS — I5022 Chronic systolic (congestive) heart failure: Secondary | ICD-10-CM | POA: Diagnosis not present

## 2019-11-15 DIAGNOSIS — K621 Rectal polyp: Secondary | ICD-10-CM | POA: Diagnosis not present

## 2019-11-15 LAB — BPAM RBC
Blood Product Expiration Date: 202106232359
Blood Product Expiration Date: 202106232359
ISSUE DATE / TIME: 202105251548
ISSUE DATE / TIME: 202105251800
Unit Type and Rh: 5100
Unit Type and Rh: 5100

## 2019-11-15 LAB — BASIC METABOLIC PANEL
Anion gap: 13 (ref 5–15)
BUN: 28 mg/dL — ABNORMAL HIGH (ref 8–23)
CO2: 23 mmol/L (ref 22–32)
Calcium: 9.3 mg/dL (ref 8.9–10.3)
Chloride: 103 mmol/L (ref 98–111)
Creatinine, Ser: 1.57 mg/dL — ABNORMAL HIGH (ref 0.61–1.24)
GFR calc Af Amer: 52 mL/min — ABNORMAL LOW (ref >60)
GFR calc non Af Amer: 45 mL/min — ABNORMAL LOW (ref >60)
Glucose, Bld: 85 mg/dL (ref 70–99)
Potassium: 3.4 mmol/L — ABNORMAL LOW (ref 3.5–5.1)
Sodium: 139 mmol/L (ref 135–145)

## 2019-11-15 LAB — TYPE AND SCREEN
ABO/RH(D): O POS
Antibody Screen: NEGATIVE
Unit division: 0
Unit division: 0

## 2019-11-15 LAB — CBC
HCT: 29.6 % — ABNORMAL LOW (ref 39.0–52.0)
Hemoglobin: 8.8 g/dL — ABNORMAL LOW (ref 13.0–17.0)
MCH: 24.5 pg — ABNORMAL LOW (ref 26.0–34.0)
MCHC: 29.7 g/dL — ABNORMAL LOW (ref 30.0–36.0)
MCV: 82.5 fL (ref 80.0–100.0)
Platelets: 221 10*3/uL (ref 150–400)
RBC: 3.59 MIL/uL — ABNORMAL LOW (ref 4.22–5.81)
RDW: 17.9 % — ABNORMAL HIGH (ref 11.5–15.5)
WBC: 5.3 10*3/uL (ref 4.0–10.5)
nRBC: 0 % (ref 0.0–0.2)

## 2019-11-15 MED ORDER — FERROUS GLUCONATE 324 (38 FE) MG PO TABS: 324.0000 mg | ORAL_TABLET | Freq: Three times a day (TID) | ORAL | Status: DC

## 2019-11-15 MED ORDER — SODIUM CHLORIDE 0.9 % IV SOLN: INTRAVENOUS | Status: DC

## 2019-11-15 NOTE — H&P (View-Only) (Signed)
Progress Note   Subjective  Chief Complaint: Symptomatic anemia  This morning, the patient tells me he is feeling slightly better after receiving 2 units of PRBCs last night.  No change in any other symptoms, has not had a bowel movement.  Is aware of plans for EGD and colonoscopy tomorrow.  Denies any new concerns or questions.   Objective   Vital signs in last 24 hours: Temp:  [97.6 F (36.4 C)-98.3 F (36.8 C)] 97.7 F (36.5 C) (05/26 1016) Pulse Rate:  [61-81] 62 (05/26 1016) Resp:  [10-24] 16 (05/26 1016) BP: (89-133)/(46-73) 89/60 (05/26 1016) SpO2:  [91 %-100 %] 100 % (05/26 1016) Weight:  [87.3 kg-91.2 kg] 87.3 kg (05/25 2212) Last BM Date: 11/14/19 General:    AA male in NAD Heart:  Regular rate and rhythm; no murmurs Lungs: Respirations even and unlabored, lungs CTA bilaterally Abdomen:  Soft, nontender and nondistended. Normal bowel sounds. Extremities:  Without edema. Neurologic:  Alert and oriented,  grossly normal neurologically. Psych:  Cooperative. Normal mood and affect.  Intake/Output from previous day: 05/25 0701 - 05/26 0700 In: 1250.3 [P.O.:360; I.V.:1.3; Blood:889] Out: 1700 [Urine:1700] Intake/Output this shift: Total I/O In: -  Out: 100 [Urine:100]  Lab Results: Recent Labs    11/14/19 1136 11/15/19 0503  WBC 5.9 5.3  HGB 6.5* 8.8*  HCT 22.9* 29.6*  PLT 238 221   BMET Recent Labs    11/14/19 1136 11/15/19 0503  NA 137 139  K 3.4* 3.4*  CL 105 103  CO2 24 23  GLUCOSE 121* 85  BUN 34* 28*  CREATININE 1.74* 1.57*  CALCIUM 9.5 9.3   LFT Recent Labs    11/14/19 1309  PROT 6.5  ALBUMIN 3.1*  AST 29  ALT 33  ALKPHOS 65  BILITOT 0.4  BILIDIR <0.1  IBILI NOT CALCULATED   Studies/Results: DG Chest Portable 1 View  Result Date: 11/14/2019 CLINICAL DATA:  Anemia EXAM: PORTABLE CHEST 1 VIEW COMPARISON:  September 08, 2019 chest CT; chest radiograph July 01, 2019. 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 There is no frank edema or consolidation. Note that a portion of the left upper lobe is obscured by a pacemaker device. Pacemaker lead is attached to the right ventricle. Heart is upper normal in size. There is distortion of pulmonary vascularity due to fibrosis bilaterally. Patient is status post coronary artery bypass grafting. No adenopathy is appreciable by radiography. There is aortic atherosclerosis. No bone lesions. IMPRESSION: Perihilar fibrosis bilaterally, more severe on the left than on the right. No edema or consolidation evident. Heart upper normal in size. No adenopathy appreciable by radiography. Pacer lead tip in right ventricle. Aortic Atherosclerosis (ICD10-I70.0). Electronically Signed   By: Lowella Grip III M.D.   On: 11/14/2019 14:52   VAS [place] ABI WITH/WO TBI  Result Date: 11/14/2019 LOWER EXTREMITY DOPPLER STUDY Indications: Claudication, and today patient denies any pain in his legs when              walking. His chief complaint today is increase swelling and pain in              the left foot x 2 months. The swelling has dissipated, but the pain              remains at the ankles. He also c/o of tingling sensations in both  feet and intermittent "shooting" pain that start at the heel to the              toes, left worse than right. Patient does state he has a history of              gout. High Risk Factors: Hypertension, past history of smoking, coronary artery                    disease. Other Factors: CABG x 5, COPD.  Comparison Study: In 05/2018, an arterial Doppler showed an ABI of .75 on the                   right and .70 on the left. Performing Technologist: Sharlett Iles RVT  Examination Guidelines: A complete evaluation includes at minimum, Doppler waveform signals and systolic blood pressure reading at the level of bilateral brachial, anterior tibial, and posterior tibial arteries, when vessel  segments are accessible. Bilateral testing is considered an integral part of a complete examination. Photoelectric Plethysmograph (PPG) waveforms and toe systolic pressure readings are included as required and additional duplex testing as needed. Limited examinations for reoccurring indications may be performed as noted.  ABI Findings: +---------+------------------+-----+----------+--------+ Right    Rt Pressure (mmHg)IndexWaveform  Comment  +---------+------------------+-----+----------+--------+ Brachial 115                                       +---------+------------------+-----+----------+--------+ ATA      74                0.63 monophasic         +---------+------------------+-----+----------+--------+ PTA      83                0.70 monophasic         +---------+------------------+-----+----------+--------+ PERO     74                0.63 monophasic         +---------+------------------+-----+----------+--------+ Great Toe48                0.41 Abnormal           +---------+------------------+-----+----------+--------+ +---------+------------------+-----+----------+-------+ Left     Lt Pressure (mmHg)IndexWaveform  Comment +---------+------------------+-----+----------+-------+ Brachial 118                                      +---------+------------------+-----+----------+-------+ ATA      81                0.69 monophasic        +---------+------------------+-----+----------+-------+ PTA      77                0.65 monophasic        +---------+------------------+-----+----------+-------+ PERO     56                0.47 monophasic        +---------+------------------+-----+----------+-------+ Great Toe54                0.46 Abnormal          +---------+------------------+-----+----------+-------+ +-------+-----------+-----------+------------+------------+ ABI/TBIToday's ABIToday's TBIPrevious ABIPrevious TBI  +-------+-----------+-----------+------------+------------+ Right  .70        .41        .75         .  18          +-------+-----------+-----------+------------+------------+ Left   .69        .46        .70         .26          +-------+-----------+-----------+------------+------------+ Bilateral ABIs appear essentially unchanged compared to prior study on 06/07/2018. Bilateral TBIs appear increased compared to prior study on 06/07/2018.  Summary: Right: Resting right ankle-brachial index indicates moderate right lower extremity arterial disease. The right toe-brachial index is abnormal. TBIs increased by .23. Left: Resting left ankle-brachial index indicates moderate left lower extremity arterial disease. The left toe-brachial index is abnormal. TBIs increased by .20.  *See table(s) above for measurements and observations.  Electronically signed by Carlyle Dolly MD on 11/14/2019 at 5:42:01 PM.    Final     Assessment / Plan:   Assessment: 1.  Symptomatic anemia: With dyspnea on exertion and fatigue, hemoglobin 6.5 on admission--> 2 units PRBCs--> 8.8, no overt GI bleeding, but Hemoccult positive; consider GI source versus other 2.  Chronic anticoagulation: With Eliquis, currently on hold in preparation for procedures 3.  Hypokalemia: This is being corrected 4.  Heart failure: EF 25-30% on 09/01/2019  Plan: 1.  Patient is scheduled for an EGD and colonoscopy tomorrow with Dr. Rush Landmark. 2.  Patient will begin movi prep this afternoon in split dose fashion. 3.  Patient remain on clears until midnight and then n.p.o. 4.  Agree with potassium correction 5.  Continue to monitor hemoglobin with transfusion as needed less than 7 6.  Please await any further recommendations from Dr. Rush Landmark later today.  Thank you for your kind consultation, we will continue to follow.   LOS: 0 days   Levin Erp  11/15/2019, 10:22 AM

## 2019-11-15 NOTE — Progress Notes (Signed)
PROGRESS NOTE  [name] [name]. EHU:[id number] DOB: April 01, 1951 DOA: 11/14/2019 PCP: [name], PA-C   LOS: 0 days   Brief Narrative / Interim history: [name] [name]. is a 69 y.o. A.A. 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 AICD, PAD, chronic anemia, A. fib (on Eliquis), history of tobacco abuse and stage 3 non-small cell lung cancer without current chemo/radiation therapy who presents to ED sent by PCP for unspecified abnormal lab values.   On ED encounter, Hgb 6.5 down from 10.7 on 3/19, BUN 34, creatinine 1.74, positive stool guaiacs.  Over the last several days, he has had mild weakness, leg swelling and constipation. Baseline chronic SOB with marked dyspnea on exertion .    Subjective / 24h Interval events: Pt was resting comfortably in bed on morning rounds, with the only complaint of distal 2nd metatarsal pain. Pt states he feels well, denies CP, SOB, fevers, chills, nausea, vomiting, red or dark tarry stools. Updated patient on upcoming endoscopy and colonoscopy tomorrow per GI consult. Pt voiced understanding and is agreeable.   Assessment & Plan: Principal Problem Symptomatic anemia with possible GI bleed - Pt is on Eliquis for A. Fib, denies recent bleeding, hematemesis, BRBPR or melena. History of chronic anemia with recent PRBC transfusion on 08/09/2019. Pt denies ever having a colonoscopy/endoscopy. Acute drop in Hgb from 10.7 on 3/19 to 6.5 on ED admission. He received 2 units PRBCs in ED on 5/25. Hgb 8.8 on 5/26. GI is consulted with planned EGD/colonoscopy tomorrow. Patient is NPO today. El   Active Problems Acute kidney injury in the setting of chronic kidney disease stage IIIa - Patient creatinine down from 1/74 on 5/25 to 1.57 today with improving BUN frm 34 to 28. Uncertain if current bleeding has affected baseline  function. Continue to monitor.   Hypokalemia, mild - potassium unchanged on BMP today. Give 40 mEq KCl dose now. Continue to monitor and replace as needed.  Combined systolic/diastolic HF with EF 85-02% - Patient appears euvolemic at this time. Maintain strict intakes and output with daily weights. Hold furosemide while patient is hypotensive and NPO.   Prolonged QT interval - ECG on 5/25 notable for QTc 518. NSR. Correct electrolyte imbalance. Avoid QT prolonging medications.   Paroxysmal A. Fib on anticoagulation - Patient on Eliquis and Amiodarone for rhythm control. Currently in NSR. Hold Eliquis pending EGD/colonoscopy on 5/27.  Essential hypertension - Patient is on home meds of amiodarone 100 mg QD, furosemide 40 mg QD, hydralazine 12.5 mg TID, metoprolol succinate 50 mg BID and Entresto 24-26 mg BID. Hold home medication while patient remains hypotensive. Patient is on cardiac telemetry.  COPD without acute exacerbation - CXR on 5/25 notable for pulmonary fibrosis s/p lung radiation therapy. Continue home inhalers as needed.  History of non-small cell lung cancer - Patient reports being followed outpatient by Dr. Julien [name], and having completed radiation and chemotherapies. Currently being observed. Continue outpatient follow-up.   Peripheral vascular disease - Continue atorvastatin 20 mg.   GERD - Continue Carafate. PPI held due to prolonged QTc on ECG.     Scheduled Meds: . allopurinol  300 mg Oral Daily  . amiodarone  200 mg Oral Daily  . atorvastatin  20 mg Oral Daily  . furosemide  40 mg Oral Daily  . hydrALAZINE  12.5 mg Oral TID  . ketotifen  1 drop  Left Eye BID  . melatonin  3 mg Oral QHS  . metoprolol succinate  50 mg Oral BID  . peg 3350 powder  0.5 kit Oral Once   And  . peg 3350 powder  0.5 kit Oral Once  . potassium chloride SA  20 mEq Oral Q1200  . sacubitril-valsartan  1 tablet Oral BID  . spironolactone  25 mg Oral Daily  . sucralfate  1 g Oral QID  .  umeclidinium bromide  1 puff Inhalation Daily   Continuous Infusions: PRN Meds:.acetaminophen **OR** acetaminophen, albuterol, guaiFENesin-dextromethorphan, oxyCODONE-acetaminophen  DVT prophylaxis: SCDs Code Status: Full code Family Communication: No family at bedside.  Status is: Observation  The patient will require care spanning > 2 midnights and should be moved to inpatient because: Inpatient level of care appropriate due to severity of illness  Dispo: The patient is from: Home              Anticipated d/c is to: Home              Anticipated d/c date is: 2 days              Patient currently is not medically stable to d/c.   Consultants:  GI much appreciated  Procedures:  2D echo: None Foley: None BiPAP: None HD: None  Microbiology  Covid negative  Antimicrobials: None   Objective: Vitals:   11/14/19 2045 11/14/19 2212 11/15/19 0241 11/15/19 0537  BP: 119/73 102/66 (!) 99/55 (!) 98/58  Pulse: 81 77 63 61  Resp: (!) _0 Temp:  98.3 F (36.8 C) 97.6 F (36.4 C) 98 F (36.7 C)  TempSrc:  Oral Oral Oral  SpO2: 100% 100% 100% 99%  Weight:  87.3 kg    Height:        Intake/Output Summary (Last 24 hours) at 11/15/2019 0834 Last data filed at 11/15/2019 0559 Gross per 24 hour  Intake 1250.33 ml  Output 1700 ml  Net -449.67 ml   Filed Weights   11/14/19 1122 11/14/19 2212  Weight: 91.2 kg 87.3 kg    Examination:  Constitutional: NAD Eyes: no scleral icterus ENMT: Mucous membranes are moist.  Neck: normal, supple Respiratory: clear to auscultation bilaterally, no wheezing, no crackles. Normal respiratory effort. No accessory muscle use.  Cardiovascular: Regular rate and rhythm, no murmurs / rubs / gallops. Trace bilateral LE edema. Good peripheral pulses Abdomen: non distended, no tenderness. Bowel sounds positive.  Musculoskeletal: no clubbing / cyanosis. Left 2nd metatarsal mildly tender to palpation. Skin: no rashes Neurologic: grossly  non-focal.  Psychiatric: Normal judgment and insight. Alert and oriented x 3. Normal mood.    Data Reviewed: I have independently reviewed following labs and imaging studies   CBC: Recent Labs  Lab 11/14/19 1136 11/15/19 0503  WBC 5.9 5.3  HGB 6.5* 8.8*  HCT 22.9* 29.6*  MCV 82.7 82.5  PLT 238 782   Basic Metabolic Panel: Recent Labs  Lab 11/14/19 1136 11/15/19 0503  NA 137 139  K 3.4* 3.4*  CL 105 103  CO2 24 23  GLUCOSE 121* 85  BUN 34* 28*  CREATININE 1.74* 1.57*  CALCIUM 9.5 9.3   Liver Function Tests: Recent Labs  Lab 11/14/19 1309  AST 29  ALT 33  ALKPHOS 65  BILITOT 0.4  PROT 6.5  ALBUMIN 3.1*   Coagulation Profile: No results for input(s): INR, PROTIME in the last 168 hours. HbA1C: No results for input(s): HGBA1C in the last 72 hours.  CBG: No results for input(s): GLUCAP in the last 168 hours.  Recent Results (from the past 240 hour(s))  SARS Coronavirus 2 by RT PCR (hospital order, performed in [facility] hospital lab) Nasopharyngeal Nasopharyngeal Swab     Status: None   Collection Time: 11/14/19  3:47 PM   Specimen: Nasopharyngeal Swab  Result Value Ref Range Status   SARS Coronavirus 2 NEGATIVE NEGATIVE Final    Comment: (NOTE) SARS-CoV-2 target nucleic acids are NOT DETECTED. The SARS-CoV-2 RNA is generally detectable in upper and lower respiratory specimens during the acute phase of infection. The lowest concentration of SARS-CoV-2 viral copies this assay can detect is 250 copies / mL. A negative result does not preclude SARS-CoV-2 infection and should not be used as the sole basis for treatment or other patient management decisions.  A negative result may occur with improper specimen collection / handling, submission of specimen other than nasopharyngeal swab, presence of viral mutation(s) within the areas targeted by this assay, and inadequate number of viral copies (<250 copies / mL). A negative result must be combined with clinical  observations, patient history, and epidemiological information. Fact Sheet for Patients:   StrictlyIdeas.no Fact Sheet for Healthcare Providers: BankingDealers.co.za This test is not yet approved or cleared  by the [place] FDA and has been authorized for detection and/or diagnosis of SARS-CoV-2 by FDA under an Emergency Use Authorization (EUA).  This EUA will remain in effect (meaning this test can be used) for the duration of the COVID-19 declaration under Section 564(b)(1) of the Act, 21 U.S.C. section 360bbb-3(b)(1), unless the authorization is terminated or revoked sooner. Performed at [facility], [place] [address], [place], [place] [address]      Radiology Studies: DG Chest Portable 1 View  Result Date: 11/14/2019 CLINICAL DATA:  Anemia EXAM: PORTABLE CHEST 1 VIEW COMPARISON:  September 08, 2019 chest CT; chest radiograph July 01, 2019. FINDINGS: There is extensive fibrosis in the left perihilar region, and to a lesser extent in the right perihilar region. There is no frank edema or consolidation. Note that a portion of the left upper lobe is obscured by a pacemaker device. Pacemaker lead is attached to the right ventricle. Heart is upper normal in size. There is distortion of pulmonary vascularity due to fibrosis bilaterally. Patient is status post coronary artery bypass grafting. No adenopathy is appreciable by radiography. There is aortic atherosclerosis. No bone lesions. IMPRESSION: Perihilar fibrosis bilaterally, more severe on the left than on the right. No edema or consolidation evident. Heart upper normal in size. No adenopathy appreciable by radiography. Pacer lead tip in right ventricle. Aortic Atherosclerosis (ICD10-I70.0). Electronically Signed   By: Lowella Grip III M.D.   On: 11/14/2019 14:52     Marzetta Board, MD, PhD [facility]  Between 7 am - 7 pm I am available, please contact me via Amion or  Securechat  Between 7 pm - 7 am I am not available, please contact night coverage MD/APP via Amion    _0 @

## 2019-11-15 NOTE — Progress Notes (Signed)
Progress Note   Subjective  Chief Complaint: Symptomatic anemia  This morning, the patient tells me he is feeling slightly better after receiving 2 units of PRBCs last night.  No change in any other symptoms, has not had a bowel movement.  Is aware of plans for EGD and colonoscopy tomorrow.  Denies any new concerns or questions.   Objective   Vital signs in last 24 hours: Temp:  [97.6 F (36.4 C)-98.3 F (36.8 C)] 97.7 F (36.5 C) (05/26 1016) Pulse Rate:  [61-81] 62 (05/26 1016) Resp:  [10-24] 16 (05/26 1016) BP: (89-133)/(46-73) 89/60 (05/26 1016) SpO2:  [91 %-100 %] 100 % (05/26 1016) Weight:  [87.3 kg-91.2 kg] 87.3 kg (05/25 2212) Last BM Date: 11/14/19 General:    AA male in NAD Heart:  Regular rate and rhythm; no murmurs Lungs: Respirations even and unlabored, lungs CTA bilaterally Abdomen:  Soft, nontender and nondistended. Normal bowel sounds. Extremities:  Without edema. Neurologic:  Alert and oriented,  grossly normal neurologically. Psych:  Cooperative. Normal mood and affect.  Intake/Output from previous day: 05/25 0701 - 05/26 0700 In: 1250.3 [P.O.:360; I.V.:1.3; Blood:889] Out: 1700 [Urine:1700] Intake/Output this shift: Total I/O In: -  Out: 100 [Urine:100]  Lab Results: Recent Labs    11/14/19 1136 11/15/19 0503  WBC 5.9 5.3  HGB 6.5* 8.8*  HCT 22.9* 29.6*  PLT 238 221   BMET Recent Labs    11/14/19 1136 11/15/19 0503  NA 137 139  K 3.4* 3.4*  CL 105 103  CO2 24 23  GLUCOSE 121* 85  BUN 34* 28*  CREATININE 1.74* 1.57*  CALCIUM 9.5 9.3   LFT Recent Labs    11/14/19 1309  PROT 6.5  ALBUMIN 3.1*  AST 29  ALT 33  ALKPHOS 65  BILITOT 0.4  BILIDIR <0.1  IBILI NOT CALCULATED   Studies/Results: DG Chest Portable 1 View  Result Date: 11/14/2019 CLINICAL DATA:  Anemia EXAM: PORTABLE CHEST 1 VIEW COMPARISON:  September 08, 2019 chest CT; chest radiograph July 01, 2019. 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 There is no frank edema or consolidation. Note that a portion of the left upper lobe is obscured by a pacemaker device. Pacemaker lead is attached to the right ventricle. Heart is upper normal in size. There is distortion of pulmonary vascularity due to fibrosis bilaterally. Patient is status post coronary artery bypass grafting. No adenopathy is appreciable by radiography. There is aortic atherosclerosis. No bone lesions. IMPRESSION: Perihilar fibrosis bilaterally, more severe on the left than on the right. No edema or consolidation evident. Heart upper normal in size. No adenopathy appreciable by radiography. Pacer lead tip in right ventricle. Aortic Atherosclerosis (ICD10-I70.0). Electronically Signed   By: Lowella Grip III M.D.   On: 11/14/2019 14:52   VAS [place] ABI WITH/WO TBI  Result Date: 11/14/2019 LOWER EXTREMITY DOPPLER STUDY Indications: Claudication, and today patient denies any pain in his legs when              walking. His chief complaint today is increase swelling and pain in              the left foot x 2 months. The swelling has dissipated, but the pain              remains at the ankles. He also c/o of tingling sensations in both  feet and intermittent "shooting" pain that start at the heel to the              toes, left worse than right. Patient does state he has a history of              gout. High Risk Factors: Hypertension, past history of smoking, coronary artery                    disease. Other Factors: CABG x 5, COPD.  Comparison Study: In 05/2018, an arterial Doppler showed an ABI of .75 on the                   right and .70 on the left. Performing Technologist: Sharlett Iles RVT  Examination Guidelines: A complete evaluation includes at minimum, Doppler waveform signals and systolic blood pressure reading at the level of bilateral brachial, anterior tibial, and posterior tibial arteries, when vessel  segments are accessible. Bilateral testing is considered an integral part of a complete examination. Photoelectric Plethysmograph (PPG) waveforms and toe systolic pressure readings are included as required and additional duplex testing as needed. Limited examinations for reoccurring indications may be performed as noted.  ABI Findings: +---------+------------------+-----+----------+--------+ Right    Rt Pressure (mmHg)IndexWaveform  Comment  +---------+------------------+-----+----------+--------+ Brachial 115                                       +---------+------------------+-----+----------+--------+ ATA      74                0.63 monophasic         +---------+------------------+-----+----------+--------+ PTA      83                0.70 monophasic         +---------+------------------+-----+----------+--------+ PERO     74                0.63 monophasic         +---------+------------------+-----+----------+--------+ Great Toe48                0.41 Abnormal           +---------+------------------+-----+----------+--------+ +---------+------------------+-----+----------+-------+ Left     Lt Pressure (mmHg)IndexWaveform  Comment +---------+------------------+-----+----------+-------+ Brachial 118                                      +---------+------------------+-----+----------+-------+ ATA      81                0.69 monophasic        +---------+------------------+-----+----------+-------+ PTA      77                0.65 monophasic        +---------+------------------+-----+----------+-------+ PERO     56                0.47 monophasic        +---------+------------------+-----+----------+-------+ Great Toe54                0.46 Abnormal          +---------+------------------+-----+----------+-------+ +-------+-----------+-----------+------------+------------+ ABI/TBIToday's ABIToday's TBIPrevious ABIPrevious TBI  +-------+-----------+-----------+------------+------------+ Right  .70        .41        .75         .  18          +-------+-----------+-----------+------------+------------+ Left   .69        .46        .70         .26          +-------+-----------+-----------+------------+------------+ Bilateral ABIs appear essentially unchanged compared to prior study on 06/07/2018. Bilateral TBIs appear increased compared to prior study on 06/07/2018.  Summary: Right: Resting right ankle-brachial index indicates moderate right lower extremity arterial disease. The right toe-brachial index is abnormal. TBIs increased by .23. Left: Resting left ankle-brachial index indicates moderate left lower extremity arterial disease. The left toe-brachial index is abnormal. TBIs increased by .20.  *See table(s) above for measurements and observations.  Electronically signed by Carlyle Dolly MD on 11/14/2019 at 5:42:01 PM.    Final     Assessment / Plan:   Assessment: 1.  Symptomatic anemia: With dyspnea on exertion and fatigue, hemoglobin 6.5 on admission--> 2 units PRBCs--> 8.8, no overt GI bleeding, but Hemoccult positive; consider GI source versus other 2.  Chronic anticoagulation: With Eliquis, currently on hold in preparation for procedures 3.  Hypokalemia: This is being corrected 4.  Heart failure: EF 25-30% on 09/01/2019  Plan: 1.  Patient is scheduled for an EGD and colonoscopy tomorrow with Dr. Rush Landmark. 2.  Patient will begin movi prep this afternoon in split dose fashion. 3.  Patient remain on clears until midnight and then n.p.o. 4.  Agree with potassium correction 5.  Continue to monitor hemoglobin with transfusion as needed less than 7 6.  Please await any further recommendations from Dr. Rush Landmark later today.  Thank you for your kind consultation, we will continue to follow.   LOS: 0 days   Levin Erp  11/15/2019, 10:22 AM

## 2019-11-16 ENCOUNTER — Inpatient Hospital Stay (HOSPITAL_COMMUNITY): Payer: Medicare HMO | Admitting: Anesthesiology

## 2019-11-16 ENCOUNTER — Encounter (HOSPITAL_COMMUNITY): Payer: Self-pay | Admitting: Internal Medicine

## 2019-11-16 ENCOUNTER — Encounter (HOSPITAL_COMMUNITY)
Admission: EM | Disposition: A | Discharge status: Home / Self Care | Payer: Self-pay | Source: Home / Self Care | Attending: Internal Medicine

## 2019-11-16 DIAGNOSIS — K228 Other specified diseases of esophagus: Secondary | ICD-10-CM

## 2019-11-16 DIAGNOSIS — K635 Polyp of colon: Secondary | ICD-10-CM

## 2019-11-16 DIAGNOSIS — K621 Rectal polyp: Secondary | ICD-10-CM

## 2019-11-16 DIAGNOSIS — K317 Polyp of stomach and duodenum: Secondary | ICD-10-CM

## 2019-11-16 HISTORY — PX: COLONOSCOPY WITH PROPOFOL: SHX5780

## 2019-11-16 HISTORY — PX: POLYPECTOMY: SHX5525

## 2019-11-16 HISTORY — PX: BIOPSY: SHX5522

## 2019-11-16 HISTORY — PX: HEMOSTASIS CLIP PLACEMENT: SHX6857

## 2019-11-16 HISTORY — PX: ESOPHAGOGASTRODUODENOSCOPY (EGD) WITH PROPOFOL: SHX5813

## 2019-11-16 LAB — CBC
HCT: 32.6 % — ABNORMAL LOW (ref 39.0–52.0)
Hemoglobin: 9.4 g/dL — ABNORMAL LOW (ref 13.0–17.0)
MCH: 24.6 pg — ABNORMAL LOW (ref 26.0–34.0)
MCHC: 28.8 g/dL — ABNORMAL LOW (ref 30.0–36.0)
MCV: 85.3 fL (ref 80.0–100.0)
Platelets: 238 10*3/uL (ref 150–400)
RBC: 3.82 MIL/uL — ABNORMAL LOW (ref 4.22–5.81)
RDW: 18.4 % — ABNORMAL HIGH (ref 11.5–15.5)
WBC: 6.5 10*3/uL (ref 4.0–10.5)
nRBC: 0.3 % — ABNORMAL HIGH (ref 0.0–0.2)

## 2019-11-16 LAB — COMPREHENSIVE METABOLIC PANEL
ALT: 41 U/L (ref 0–44)
AST: 29 U/L (ref 15–41)
Albumin: 3 g/dL — ABNORMAL LOW (ref 3.5–5.0)
Alkaline Phosphatase: 62 U/L (ref 38–126)
Anion gap: 11 (ref 5–15)
BUN: 26 mg/dL — ABNORMAL HIGH (ref 8–23)
CO2: 20 mmol/L — ABNORMAL LOW (ref 22–32)
Calcium: 9.3 mg/dL (ref 8.9–10.3)
Chloride: 108 mmol/L (ref 98–111)
Creatinine, Ser: 1.85 mg/dL — ABNORMAL HIGH (ref 0.61–1.24)
GFR calc Af Amer: 42 mL/min — ABNORMAL LOW (ref >60)
GFR calc non Af Amer: 37 mL/min — ABNORMAL LOW (ref >60)
Glucose, Bld: 77 mg/dL (ref 70–99)
Potassium: 3.8 mmol/L (ref 3.5–5.1)
Sodium: 139 mmol/L (ref 135–145)
Total Bilirubin: 0.7 mg/dL (ref 0.3–1.2)
Total Protein: 6.2 g/dL — ABNORMAL LOW (ref 6.5–8.1)

## 2019-11-16 LAB — GLUCOSE, CAPILLARY
Glucose-Capillary: 71 mg/dL (ref 70–99)
Glucose-Capillary: 119 mg/dL — ABNORMAL HIGH (ref 70–99)

## 2019-11-16 SURGERY — COLONOSCOPY WITH PROPOFOL
Anesthesia: Monitor Anesthesia Care

## 2019-11-16 MED ORDER — PHENYLEPHRINE HCL (PRESSORS) 10 MG/ML IV SOLN
INTRAVENOUS | Status: DC | PRN
  Administered 2019-11-16: 80 ug via INTRAVENOUS
  Administered 2019-11-16: 120 ug via INTRAVENOUS
  Administered 2019-11-16 (×4): 80 ug via INTRAVENOUS

## 2019-11-16 MED ORDER — FERROUS GLUCONATE 324 (38 FE) MG PO TABS
324.0000 mg | ORAL_TABLET | Freq: Every day | ORAL | Status: DC
  Administered 2019-11-17 – 2019-11-18 (×2): 324 mg via ORAL
  Filled 2019-11-16 (×2): qty 1

## 2019-11-16 MED ORDER — SODIUM CHLORIDE 0.9 % IV SOLN
510.0000 mg | Freq: Once | INTRAVENOUS | Status: AC
  Administered 2019-11-16: 510 mg via INTRAVENOUS
  Filled 2019-11-16: qty 17

## 2019-11-16 MED ORDER — MIDAZOLAM HCL 2 MG/2ML IJ SOLN
INTRAMUSCULAR | Status: DC | PRN
  Administered 2019-11-16 (×2): 1 mg via INTRAVENOUS

## 2019-11-16 MED ORDER — PROPOFOL 500 MG/50ML IV EMUL
INTRAVENOUS | Status: DC | PRN
  Administered 2019-11-16: 50 ug/kg/min via INTRAVENOUS

## 2019-11-16 MED ORDER — DEXTROSE 50 % IV SOLN
INTRAVENOUS | Status: AC
  Filled 2019-11-16: qty 50

## 2019-11-16 MED ORDER — DEXTROSE 50 % IV SOLN
25.0000 mL | Freq: Once | INTRAVENOUS | Status: AC
  Administered 2019-11-16: 25 mL via INTRAVENOUS

## 2019-11-16 MED ORDER — LACTATED RINGERS IV SOLN
INTRAVENOUS | Status: DC
  Administered 2019-11-16: 1000 mL via INTRAVENOUS

## 2019-11-16 MED ORDER — LIDOCAINE HCL (CARDIAC) PF 100 MG/5ML IV SOSY
PREFILLED_SYRINGE | INTRAVENOUS | Status: DC | PRN
  Administered 2019-11-16: 40 mg via INTRATRACHEAL

## 2019-11-16 SURGICAL SUPPLY — 25 items

## 2019-11-16 NOTE — Op Note (Signed)
[facility] Patient Name: [name] [name] Procedure Date : 11/16/2019 MRN: [id number] Attending MD: Justice Britain , MD Date of Birth: Oct 27, 1950 CSN: [id number] Age: 69 Admit Type: Inpatient Procedure:                Upper GI endoscopy Indications:              Iron deficiency anemia Providers:                Justice Britain, MD, Benetta Spar RN, RN,                            Lazaro Arms, Technician Referring MD:             [facility] Medicines:                Monitored Anesthesia Care Complications:            No immediate complications. Estimated Blood Loss:     Estimated blood loss was minimal. Procedure:                Pre-Anesthesia Assessment:                           - Prior to the procedure, a History and Physical                            was performed, and patient medications and                            allergies were reviewed. The patient's tolerance of                            previous anesthesia was also reviewed. The risks                            and benefits of the procedure and the sedation                            options and risks were discussed with the patient.                            All questions were answered, and informed consent                            was obtained. Prior Anticoagulants: The patient has                            taken Eliquis (apixaban), last dose was 2 days                            prior to procedure. ASA Grade Assessment: III - A                            patient with severe systemic disease. After  reviewing the risks and benefits, the patient was                            deemed in satisfactory condition to undergo the                            procedure.                           After obtaining informed consent, the endoscope was                            passed under direct vision. Throughout the                            procedure, the patient's blood  pressure, pulse, and                            oxygen saturations were monitored continuously. The                            GIF-H190 ([id number] Olympus gastroscope was                            introduced through the mouth, and advanced to the                            second part of duodenum. The upper GI endoscopy was                            accomplished without difficulty. The patient                            tolerated the procedure. Scope In: Scope Out: Findings:      No gross lesions were noted in the proximal esophagus.      White nummular lesions were noted in the mid esophagus. Biopsies were       taken with a cold forceps for histology to rule out Candida.      No gross lesions were noted in the distal esophagus.      The Z-line was regular and was found 41 cm from the incisors.      Patchy mildly erythematous mucosa without bleeding was found in the       gastric body and in the gastric antrum.      No other gross lesions were noted in the entire examined stomach.       Biopsies were taken with a cold forceps for histology and Helicobacter       pylori testing.      A single 8 mm sessile polyp with no bleeding was found in the second       portion of the duodenum. The polyp was removed with a cold snare.       Resection and retrieval were complete. To prevent bleeding after the       polypectomy, two hemostatic clips were successfully placed (MR       conditional). There was no bleeding at  the end of the procedure.      No other gross lesions were noted in the duodenal bulb, in the first       portion of the duodenum and in the second portion of the duodenum.       Biopsies for histology were taken with a cold forceps for evaluation of       celiac disease. Impression:               - White nummular lesions in esophageal mucosa in                            middle esophagus. Biopsied for Candida.                           - No other gross lesions in esophagus  proximally                            and distally.                           - Z-line regular, 41 cm from the incisors.                           - Erythematous mucosa in the gastric body and                            antrum. No gross lesions in the stomach. Biopsied.                           - A single duodenal polyp. Resected and retrieved.                            Clips (MR conditional) were placed.                           - No other gross lesions in the duodenal bulb, in                            the first portion of the duodenum and in the second                            portion of the duodenum. Biopsied. Recommendation:           - Proceed to scheduled colonoscopy.                           - Observe patient's clinical course.                           - Omeprazole 40 mg BID x 1 month and then return to                            40 mg daily.                           -  Await pathology results.                           - Repeat upper endoscopy for surveillance based on                            pathology results, if polyp returns adenomatous                            likely 1-year followup.                           - The findings and recommendations were discussed                            with the patient.                           - The findings and recommendations were discussed                            with the referring physician. Procedure Code(s):        --- Professional ---                           [phone], Esophagogastroduodenoscopy, flexible,                            transoral; with removal of tumor(s), polyp(s), or                            other lesion(s) by snare technique Diagnosis Code(s):        --- Professional ---                           K22.8, Other specified diseases of esophagus                           K31.89, Other diseases of stomach and duodenum                           K31.7, Polyp of stomach and duodenum                            D50.9, Iron deficiency anemia, unspecified CPT copyright 2019 [employer]. All rights reserved. The codes documented in this report are preliminary and upon coder review may  be revised to meet current compliance requirements. Justice Britain, MD 11/16/2019 11:52:15 AM Number of Addenda: 0

## 2019-11-16 NOTE — Transfer of Care (Signed)
Immediate Anesthesia Transfer of Care Note  Patient: [name] [name].  Procedure(s) Performed: COLONOSCOPY WITH PROPOFOL (N/A ) ESOPHAGOGASTRODUODENOSCOPY (EGD) WITH PROPOFOL (N/A ) POLYPECTOMY BIOPSY HEMOSTASIS CLIP PLACEMENT  Patient Location: Endoscopy Unit  Anesthesia Type:MAC  Level of Consciousness: drowsy and patient cooperative  Airway & Oxygen Therapy: Patient Spontanous Breathing  Post-op Assessment: Report given to RN and Post -op Vital signs reviewed and stable  Post vital signs: Reviewed and stable  Last Vitals:  Vitals Value Taken Time  BP 98/59 11/16/19 1143  Temp    Pulse 59 11/16/19 1143  Resp 24 11/16/19 1143  SpO2 99 % 11/16/19 1143    Last Pain:  Vitals:   11/16/19 0954  TempSrc: Oral  PainSc: 0-No pain         Complications: No apparent anesthesia complications

## 2019-11-16 NOTE — Plan of Care (Signed)
  Problem: Clinical Measurements: Goal: Diagnostic test results will improve Outcome: Adequate for Discharge

## 2019-11-16 NOTE — Anesthesia Preprocedure Evaluation (Addendum)
Anesthesia Evaluation  Patient identified by MRN, date of birth, ID band Patient awake    Reviewed: Allergy & Precautions, NPO status , Patient's Chart, lab work & pertinent test results  Airway Mallampati: I  TM Distance: >3 FB Neck ROM: Full    Dental  (+) Dental Advisory Given, Edentulous Upper, Edentulous Lower   Pulmonary COPD, former smoker,    breath sounds clear to auscultation       Cardiovascular hypertension, Pt. on medications and Pt. on home beta blockers + CAD, + CABG, + Peripheral Vascular Disease and +CHF  + Cardiac Defibrillator  Rhythm:Regular Rate:Normal     Neuro/Psych  Neuromuscular disease negative psych ROS   GI/Hepatic Neg liver ROS, GERD  Medicated,  Endo/Other  diabetes  Renal/GU Renal InsufficiencyRenal disease     Musculoskeletal   Abdominal Normal abdominal exam  (+)   Peds  Hematology   Anesthesia Other Findings   Reproductive/Obstetrics                            Echo:  1. Left ventricular ejection fraction, by estimation, is 25 to 30%. The  left ventricle has severely decreased function. The left ventricle  demonstrates global hypokinesis. The left ventricular internal cavity size  was mildly dilated.  2. Right ventricular systolic function is mildly reduced. The right  ventricular size is normal.  3. No evidence for PFO/ASD by color doppler.. Left atrial size was  moderately dilated. No left atrial/left atrial appendage thrombus was  detected.  4. Right atrial size was mildly dilated.  5. The posterior mitral leaflet was restricted with moderate-severe (3+)  regurgitation visually. PISA ERO 0.22 cm^2 with regurgitant volume 40 cc  suggests moderate mitral regurgitation. The pulmonary vein systolic  doppler flow was flattened but not  reversed. No evidence of mitral stenosis.  6. Peak RV-RA gradient 40 mmHg. Tricuspid valve regurgitation is mild to   moderate.  7. The aortic valve is tricuspid. Aortic valve regurgitation is not  visualized. No aortic stenosis is present.  8. The aorta was normal in caliber with mild plaque.   Anesthesia Physical Anesthesia Plan  ASA: IV  Anesthesia Plan: General   Post-op Pain Management:    Induction: Intravenous  PONV Risk Score and Plan: 0 and Propofol infusion  Airway Management Planned: Natural Airway and Nasal Cannula  Additional Equipment: None  Intra-op Plan:   Post-operative Plan:   Informed Consent: I have reviewed the patients History and Physical, chart, labs and discussed the procedure including the risks, benefits and alternatives for the proposed anesthesia with the patient or authorized representative who has indicated his/her understanding and acceptance.       Plan Discussed with: CRNA  Anesthesia Plan Comments:        Anesthesia Quick Evaluation

## 2019-11-16 NOTE — Op Note (Signed)
[facility] Patient Name: [name] [name] Procedure Date : 11/16/2019 MRN: [id number] Attending MD: Justice Britain , MD Date of Birth: Feb 08, 1951 CSN: [id number] Age: 69 Admit Type: Inpatient Procedure:                Colonoscopy Indications:              Gastrointestinal occult blood loss, Iron deficiency                            anemia Providers:                Justice Britain, MD, Benetta Spar RN, RN,                            Lazaro Arms, Technician Referring MD:             [facility] Medicines:                Monitored Anesthesia Care Complications:            No immediate complications. Estimated Blood Loss:     Estimated blood loss was minimal. Procedure:                Pre-Anesthesia Assessment:                           - Prior to the procedure, a History and Physical                            was performed, and patient medications and                            allergies were reviewed. The patient's tolerance of                            previous anesthesia was also reviewed. The risks                            and benefits of the procedure and the sedation                            options and risks were discussed with the patient.                            All questions were answered, and informed consent                            was obtained. Prior Anticoagulants: The patient has                            taken Eliquis (apixaban), last dose was 2 days                            prior to procedure. ASA Grade Assessment: III - A  patient with severe systemic disease. After                            reviewing the risks and benefits, the patient was                            deemed in satisfactory condition to undergo the                            procedure.                           After obtaining informed consent, the colonoscope                            was passed under direct vision. Throughout the                         procedure, the patient's blood pressure, pulse, and                            oxygen saturations were monitored continuously. The                            CF-HQ190L ([id number]) Olympus colonoscope was                            introduced through the anus and advanced to the 5                            cm into the ileum. The colonoscopy was somewhat                            difficult due to significant looping. Successful                            completion of the procedure was aided by changing                            the patient to a supine position, using manual                            pressure, withdrawing and reinserting the scope,                            straightening and shortening the scope to obtain                            bowel loop reduction and using scope torsion. The                            quality of the bowel preparation was fair. The  terminal ileum, ileocecal valve, appendiceal                            orifice, and rectum were photographed. Scope In: 10:59:37 AM Scope Out: 11:36:11 AM Scope Withdrawal Time: 0 hours 24 minutes 37 seconds  Total Procedure Duration: 0 hours 36 minutes 34 seconds  Findings:      The digital rectal exam findings include hemorrhoids. Pertinent       negatives include no palpable rectal lesions.      The terminal ileum and ileocecal valve appeared normal. No evidence of       melena in small bowel.      Copious quantities of semi-liquid stool was found in the entire colon,       interfering with visualization. Lavage of the area was performed using       copious amounts, resulting in incomplete clearance with fair       visualization.      Four sessile polyps were found in the rectum (1), descending colon (1)       and ascending colon (2). The polyps were 3 to 7 mm in size. These polyps       were removed with a cold snare. Resection and retrieval were complete.       Multiple small-mouthed diverticula were found in the recto-sigmoid colon       and sigmoid colon.      Non-bleeding non-thrombosed external and internal hemorrhoids were found       during retroflexion, during perianal exam and during digital exam. The       hemorrhoids were Grade II (internal hemorrhoids that prolapse but reduce       spontaneously). Impression:               - Preparation of the colon was fair even after                            extensive/copious lavage.                           - Hemorrhoids found on digital rectal exam.                           - The examined portion of the ileum was normal. No                            evidence of melena in small bowel.                           - Four 3 to 7 mm polyps in the rectum, in the                            descending colon and in the ascending colon,                            removed with a cold snare. Resected and retrieved.                           - Diverticulosis in the recto-sigmoid colon and in  the sigmoid colon.                           - Non-bleeding non-thrombosed external and internal                            hemorrhoids. Recommendation:           - The patient will be observed post-procedure,                            until all discharge criteria are met.                           - Return patient to hospital ward for ongoing care.                           - Advance diet as tolerated.                           - Await pathology results.                           - Plan for IV Iron while in house. He should                            receive another dose of IV Iron as outpatient in                            2-4 weeks.                           - CBC follow up with PCP/Cardiology in 2-weeks.                           - We will plan for a CBC/Iron/TIBC/Ferritin to be                            performed in approximately 65-month                           - Will attempt to  get patient follow up in clinic.                           - Recommend repeat colonoscopy in 6-12 months for                            screening purposes as this evaluation although fair                            is inadequate for colon cancer screening. Would                            pursue 1 week of Miralax daily and a 2-day  preparation for next procedure.                           - If patient returns with IDA then consider SBE/VCE.                           - May restart Eliquis no sooner than 72 hours to                            decrease risk of post-interventional bleeding from                            procedures performed today (5/30 in AM). If felt to                            need anticoagulation sooner then would recommend                            heparin drip without bolus.                           - GI will signoff but please let us know if there                            is something that we can be of assitance with.                           - The findings and recommendations were discussed                            with the patient.                           - The findings and recommendations were discussed                            with the referring physician. Procedure Code(s):        --- Professional ---                           [phone], Colonoscopy, flexible; with removal of                            tumor(s), polyp(s), or other lesion(s) by snare                            technique Diagnosis Code(s):        --- Professional ---                           K64.1, Second degree hemorrhoids                           K62.1, Rectal polyp  K63.5, Polyp of colon                           R19.5, Other fecal abnormalities                           D50.9, Iron deficiency anemia, unspecified                           K57.30, Diverticulosis of large intestine without                            perforation or  abscess without bleeding CPT copyright 2019 American Medical Association. All rights reserved. The codes documented in this report are preliminary and upon coder review may  be revised to meet current compliance requirements. Justice Britain, MD 11/16/2019 12:00:22 PM Number of Addenda: 0

## 2019-11-16 NOTE — Anesthesia Procedure Notes (Signed)
Procedure Name: MAC Date/Time: 11/16/2019 10:35 AM Performed by: Kathryne Hitch, CRNA Pre-anesthesia Checklist: Emergency Drugs available, Suction available, Patient being monitored and Patient identified Patient Re-evaluated:Patient Re-evaluated prior to induction Oxygen Delivery Method: Nasal cannula Preoxygenation: Pre-oxygenation with 100% oxygen Induction Type: IV induction Dental Injury: Teeth and Oropharynx as per pre-operative assessment

## 2019-11-16 NOTE — Interval H&P Note (Signed)
History and Physical Interval Note:  11/16/2019 9:56 AM  [name] [name].  has presented today for surgery, with the diagnosis of Anemia.  The various methods of treatment have been discussed with the patient and family. After consideration of risks, benefits and other options for treatment, the patient has consented to  Procedure(s): COLONOSCOPY WITH PROPOFOL (N/A) ESOPHAGOGASTRODUODENOSCOPY (EGD) WITH PROPOFOL (N/A) as a surgical intervention.  The patient's history has been reviewed, patient examined, no change in status, stable for surgery.  I have reviewed the patient's chart and labs.  Questions were answered to the patient's satisfaction.     [employer]

## 2019-11-16 NOTE — Plan of Care (Signed)
  Problem: Education: Goal: Knowledge of General Education information will improve Description Including pain rating scale, medication(s)/side effects and non-pharmacologic comfort measures Outcome: Progressing   

## 2019-11-16 NOTE — Progress Notes (Signed)
PROGRESS NOTE  [name] [name]. CZY:[id number] DOB: [birth year] DOA: 11/14/2019 PCP: [name], PA-C   LOS: 1 day   Brief Narrative / Interim history: [name] [name]. is a 69 y.o. A.A. 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 AICD, PAD, chronic anemia, A. fib (on Eliquis), history of tobacco abuse and stage 3 non-small cell lung cancer without current chemo/radiation therapy who presents to ED sent by PCP for unspecified abnormal lab values.   On ED encounter, Hgb 6.5 down from 10.7 on 3/19, BUN 34, creatinine 1.74, positive stool guaiacs.  Over the last several days, he has had mild weakness, leg swelling and constipation. Baseline chronic SOB with marked dyspnea on exertion . All improved at this time.   Subjective / 24h Interval events: [name] was comfortable in bed on morning rounds. He states he slept well and has no complaints at this time. He is NPO since midnight as scheduled for EGD and colonoscopy at 9:45am. Patient is agreeable.   Assessment & Plan: Principal Problem Symptomatic anemia with possible GI bleed - History of chronic anemia with transfusions on 2/17 and 2 units on 5/25 on this hospital encounter. Hgb 9.4 on 5/26 and appropriately improved and patient is feeling well. Positive FOBT  With long term anticoagulation. Eliquis held due to EGD and colonoscopy this morning. We appreciate GI consult. Awaiting procedure results and will follow-up with GI recommendations.   Active Problems Chronic Kidney disease stage IIIa - Baseline creatinine 1.6-1.8. 1.85 this am with patient NPO. Continue to monitor.  Combined chronic systolic/diastolic CHF - Appears euvolemic at this time. Furosemide was held while he is NPO. Will resume with advancing diet s/p EGD and colonoscopy. Continue beta-blocker and spironolactone per holding  parameters. Patient recently hypotensive.  Hyperlipidemia - Continue statin.  Essential hypertension - Continue beta-blocker and spironolactone per holding parameters. Patient recently hypotensive.  Paroxysmal A. Fib - Eliquis held whild suspected GI bleed and peri-procedural. Resume Eliquis per GI recommendation. Continue amiodarone, SCDs.   COPD without exacerbation - No wheezing, on room air, without dyspnea.   History of non-small cell lung cancer - outpatient follow-up with Dr. Earlie Server.    Scheduled Meds: . [MAR Hold] allopurinol  300 mg Oral Daily  . [MAR Hold] amiodarone  200 mg Oral Daily  . [MAR Hold] atorvastatin  20 mg Oral Daily  . [MAR Hold] ferrous gluconate  324 mg Oral TID WC  . [MAR Hold] furosemide  40 mg Oral Daily  . [MAR Hold] hydrALAZINE  12.5 mg Oral TID  . [MAR Hold] ketotifen  1 drop Left Eye BID  . [MAR Hold] melatonin  3 mg Oral QHS  . [MAR Hold] metoprolol succinate  50 mg Oral BID  . [MAR Hold] potassium chloride SA  20 mEq Oral Q1200  . [MAR Hold] spironolactone  25 mg Oral Daily  . [MAR Hold] sucralfate  1 g Oral QID  . [MAR Hold] umeclidinium bromide  1 puff Inhalation Daily   Continuous Infusions: . sodium chloride    . lactated ringers 1,000 mL (11/16/19 0956)   PRN Meds:.[MAR Hold] acetaminophen **OR** [MAR Hold] acetaminophen, [MAR Hold] albuterol, [MAR Hold] guaiFENesin-dextromethorphan, [MAR Hold] oxyCODONE-acetaminophen  DVT prophylaxis: SCDs. Will resume Eliquis post GI procedure per GI recommendation. Code Status: Full code Family Communication: No family at bedside.  Status is: Inpatient  Remains inpatient appropriate  because:Inpatient level of care appropriate due to severity of illness   Dispo: The patient is from: Home              Anticipated d/c is to: Home              Anticipated d/c date is: 1 day              Patient currently is not medically stable to d/c.   Consultants:  GI, much appreciated.  Procedures:  2D  echo: None Foley: None BiPAP: None HD: None  Microbiology  Covid negative  Antimicrobials: None.   Objective: Vitals:   11/16/19 0447 11/16/19 0841 11/16/19 0855 11/16/19 0954  BP: 101/61 95/60  113/60  Pulse: 64 71 70 66  Resp: 19 20 18 13   Temp: (!) 97.5 F (36.4 C) 97.8 F (36.6 C)  98.7 F (37.1 C)  TempSrc: Oral Oral  Oral  SpO2: 98% 96% 95% 100%  Weight:      Height:        Intake/Output Summary (Last 24 hours) at 11/16/2019 1013 Last data filed at 11/16/2019 0549 Gross per 24 hour  Intake 960 ml  Output 428 ml  Net 532 ml   Filed Weights   11/14/19 1122 11/14/19 2212  Weight: 91.2 kg 87.3 kg    Examination:  Constitutional: NAD Eyes: no scleral icterus ENMT: Mucous membranes are moist.  Neck: normal, supple Respiratory: clear to auscultation bilaterally, no wheezing, no crackles. Normal respiratory effort. No accessory muscle use.  Cardiovascular: Regular rate and rhythm, no murmurs / rubs / gallops. No LE edema. Good peripheral pulses Abdomen: non distended, no tenderness. Bowel sounds positive.  Musculoskeletal: no clubbing / cyanosis.  Skin: no rashes Neurologic: Grossly non-focal.  Psychiatric: Normal judgment and insight. Alert and oriented x 3. Normal mood.    Data Reviewed: I have independently reviewed following labs and imaging studies   CBC: Recent Labs  Lab 11/14/19 1136 11/15/19 0503 11/16/19 0322  WBC 5.9 5.3 6.5  HGB 6.5* 8.8* 9.4*  HCT 22.9* 29.6* 32.6*  MCV 82.7 82.5 85.3  PLT 238 221 867   Basic Metabolic Panel: Recent Labs  Lab 11/14/19 1136 11/15/19 0503 11/16/19 0322  NA 137 139 139  K 3.4* 3.4* 3.8  CL 105 103 108  CO2 24 23 20*  GLUCOSE 121* 85 77  BUN 34* 28* 26*  CREATININE 1.74* 1.57* 1.85*  CALCIUM 9.5 9.3 9.3   Liver Function Tests: Recent Labs  Lab 11/14/19 1309 11/16/19 0322  AST 29 29  ALT 33 41  ALKPHOS 65 62  BILITOT 0.4 0.7  PROT 6.5 6.2*  ALBUMIN 3.1* 3.0*   Coagulation Profile: No  results for input(s): INR, PROTIME in the last 168 hours. HbA1C: No results for input(s): HGBA1C in the last 72 hours. CBG: Recent Labs  Lab 11/16/19 0959  GLUCAP 71    Recent Results (from the past 240 hour(s))  SARS Coronavirus 2 by RT PCR (hospital order, performed in [facility] hospital lab) Nasopharyngeal Nasopharyngeal Swab     Status: None   Collection Time: 11/14/19  3:47 PM   Specimen: Nasopharyngeal Swab  Result Value Ref Range Status   SARS Coronavirus 2 NEGATIVE NEGATIVE Final    Comment: (NOTE) SARS-CoV-2 target nucleic acids are NOT DETECTED. The SARS-CoV-2 RNA is generally detectable in upper and lower respiratory specimens during the acute phase of infection. The lowest concentration of SARS-CoV-2 viral copies this assay can detect is 250 copies /  mL. A negative result does not preclude SARS-CoV-2 infection and should not be used as the sole basis for treatment or other patient management decisions.  A negative result may occur with improper specimen collection / handling, submission of specimen other than nasopharyngeal swab, presence of viral mutation(s) within the areas targeted by this assay, and inadequate number of viral copies (<250 copies / mL). A negative result must be combined with clinical observations, patient history, and epidemiological information. Fact Sheet for Patients:   StrictlyIdeas.no Fact Sheet for Healthcare Providers: BankingDealers.co.za This test is not yet approved or cleared  by the [place] FDA and has been authorized for detection and/or diagnosis of SARS-CoV-2 by FDA under an Emergency Use Authorization (EUA).  This EUA will remain in effect (meaning this test can be used) for the duration of the COVID-19 declaration under Section 564(b)(1) of the Act, 21 U.S.C. section 360bbb-3(b)(1), unless the authorization is terminated or revoked sooner. Performed at [facility], [place] [address] [place], [place] [address]      Radiology Studies: No results found.    Marzetta Board, MD, PhD [facility]  Between 7 am - 7 pm I am available, please contact me via Amion or Securechat  Between 7 pm - 7 am I am not available, please contact night coverage MD/APP via Amion    [email or web address]

## 2019-11-17 LAB — BASIC METABOLIC PANEL
Anion gap: 10 (ref 5–15)
BUN: 31 mg/dL — ABNORMAL HIGH (ref 8–23)
CO2: 23 mmol/L (ref 22–32)
Calcium: 9.6 mg/dL (ref 8.9–10.3)
Chloride: 105 mmol/L (ref 98–111)
Creatinine, Ser: 1.94 mg/dL — ABNORMAL HIGH (ref 0.61–1.24)
GFR calc Af Amer: 40 mL/min — ABNORMAL LOW (ref >60)
GFR calc non Af Amer: 35 mL/min — ABNORMAL LOW (ref >60)
Glucose, Bld: 89 mg/dL (ref 70–99)
Potassium: 3.8 mmol/L (ref 3.5–5.1)
Sodium: 138 mmol/L (ref 135–145)

## 2019-11-17 LAB — CBC
HCT: 30.3 % — ABNORMAL LOW (ref 39.0–52.0)
Hemoglobin: 8.9 g/dL — ABNORMAL LOW (ref 13.0–17.0)
MCH: 24.6 pg — ABNORMAL LOW (ref 26.0–34.0)
MCHC: 29.4 g/dL — ABNORMAL LOW (ref 30.0–36.0)
MCV: 83.7 fL (ref 80.0–100.0)
Platelets: 209 10*3/uL (ref 150–400)
RBC: 3.62 MIL/uL — ABNORMAL LOW (ref 4.22–5.81)
RDW: 18.5 % — ABNORMAL HIGH (ref 11.5–15.5)
WBC: 7.6 10*3/uL (ref 4.0–10.5)
nRBC: 0.3 % — ABNORMAL HIGH (ref 0.0–0.2)

## 2019-11-17 LAB — SURGICAL PATHOLOGY

## 2019-11-17 NOTE — TOC Initial Note (Signed)
Transition of Care (TOC) - Initial/Assessment Note    Patient Details  Name: [name] [name]. MRN: [id number] Date of Birth: Oct 10, 1950  Transition of [facility]) CM/SW Contact:    Bartholomew Crews, RN Phone Number: [phone] 11/17/2019, 11:21 AM  Clinical Narrative:                  Spoke with patient at the bedside. PTA home alone. Has PCS worker from [place] who visits from 9a-1p M-F. Sister is supportive and assists him with medication management. Gets some medications for mail order and others [facility] [place]. Does not use Medicaid transportation stating that a lady helps transport him to medical appointments. Patient states that his sister will pick him up at discharge to transport home. TOC following for transition needs.   Expected Discharge Plan: Home/Self Care Barriers to Discharge: Continued Medical Work up   Patient Goals and CMS Choice Patient states their goals for this hospitalization and ongoing recovery are:: return home CMS Medicare.gov Compare Post Acute Care list provided to:: Patient Choice offered to / list presented to : NA  Expected Discharge Plan and Services Expected Discharge Plan: Home/Self Care In-house Referral: NA Discharge Planning Services: CM Consult Post Acute Care Choice: NA Living arrangements for the past 2 months: Apartment                 DME Arranged: N/A DME Agency: NA       HH Arranged: NA HH Agency: NA        Prior Living Arrangements/Services Living arrangements for the past 2 months: Apartment Lives with:: Self Patient language and need for interpreter reviewed:: Yes Do you feel safe going back to the place where you live?: Yes      Need for Family Participation in Patient Care: Yes (Comment) Care giver support system in place?: Yes (comment) Current home services: Homehealth aide(PCS 9a-1p M-F from [facility]) Criminal Activity/Legal Involvement Pertinent to Current Situation/Hospitalization: No - Comment as  needed  Activities of Daily Living Home Assistive Devices/Equipment: Cane (specify quad or straight), Walker (specify type) ADL Screening (condition at time of admission) Patient's cognitive ability adequate to safely complete daily activities?: Yes Is the patient deaf or have difficulty hearing?: No Does the patient have difficulty seeing, even when wearing glasses/contacts?: No Does the patient have difficulty concentrating, remembering, or making decisions?: No Patient able to express need for assistance with ADLs?: Yes Does the patient have difficulty dressing or bathing?: No Independently performs ADLs?: Yes (appropriate for developmental age) Does the patient have difficulty walking or climbing stairs?: Yes Weakness of Legs: Both Weakness of Arms/Hands: None  Permission Sought/Granted Permission sought to share information with : Family Supports          Permission granted to share info w Relationship: sister     Emotional Assessment Appearance:: Appears stated age Attitude/Demeanor/Rapport: Engaged Affect (typically observed): Accepting Orientation: : Oriented to Self, Oriented to  Time, Oriented to Place, Oriented to Situation Alcohol / Substance Use: Not Applicable Psych Involvement: No (comment)  Admission diagnosis:  Symptomatic anemia [D64.9] Patient Active Problem List   Diagnosis Date Noted  . Symptomatic anemia 11/14/2019  . Prolonged QT interval 11/14/2019  . GIB (gastrointestinal bleeding) 11/14/2019  . Hypokalemia 11/14/2019  . Chronic kidney disease, stage III (moderate) 11/14/2019  . History of lung cancer 11/14/2019  . Anemia of chronic disease 08/09/2019  . Altered mental status 07/25/2019  . Peripheral neuropathy 07/25/2019  . Dehydration 06/27/2019  . Primary malignant  neoplasm of bronchus of left upper lobe ([place]) 05/24/2019  . Stage III squamous cell carcinoma of left lung ([place]) 04/27/2019  . Encounter for antineoplastic chemotherapy 04/27/2019   . Goals of care, counseling/discussion 04/27/2019  . Avascular necrosis of bone of right hip ([place]) 11/30/2018  . Avascular necrosis of hip, right ([place]) 11/30/2018  . Chest pain 04/08/2015  . Abnormal nuclear stress test 04/08/2015  . Ischemic cardiomyopathy 07/06/2014  . Lumbago 03/13/2014  . Special screening for malignant neoplasms, colon 02/09/2014  . Normocytic anemia 02/09/2014  . Pleural effusion on left 11/28/2013  . Atherosclerosis of native arteries of extremity with intermittent claudication ([place]) 11/07/2013  . Weakness of both legs 11/07/2013  . Smoker 09/19/2013  . Numbness in right leg/ Foot 06/14/2013  . Pain in limb-Right Leg/foot 06/14/2013  . Atherosclerosis of native arteries of the extremities with ulceration(440.23) 06/14/2013  . Chronic systolic heart failure ([place]) 05/29/2013  . Atrial fibrillation ([place]) 05/29/2013  . Chest pain, mid sternal 05/19/2013  . S/P CABG x 5 05/08/2013  . Right foot ulcer ([place]) 05/04/2013  . Coronary atherosclerosis of native coronary artery 04/28/2013  . Acute systolic heart failure ([place]) 04/28/2013  . Abnormal blood chemistry 12/30/2012  . Abnormal LFTs 12/30/2012  . Aspiration pneumonia ([place]) 12/30/2012  . Chronic congestive heart failure ([place]) 12/27/2012  . Leg weakness 12/26/2012  . Pneumonia 05/18/2011  . Gout 05/17/2011  . Respiratory distress, acute 05/17/2011  . Hypertension 05/17/2011  . COPD (chronic obstructive pulmonary disease) ([place]) 05/17/2011  . CHF, acute ([place]) 05/17/2011   PCP:  [name], PA-C Pharmacy:   [id number] [place], [place] [address] Phone: [phone] Fax: [place], [place] [address] Phone: [phone] Fax: [phone]     Social Determinants of Health (SDOH) Interventions    Readmission Risk  Interventions No flowsheet data found.

## 2019-11-17 NOTE — Progress Notes (Signed)
PCP: [name] [facility]) Cardiac Surgeon: Dr Roxan Hockey Pulmonologist: Dr. Lamonte Sakai Cardiology: Dr. Aundra Dubin  HPI:  [name] [name] is a 69 y.o. with history of COPD, HTN, 3V CAD s/p CABG x 5  (CABG 04/2013: LIMA to LAD, SVG to second diagonal, SVG to ramus intermediate and obtuse marginal 2, SVG to posterior descending), ischemic cardiomyopathy, chronic systolic HF, DM2, PVD and tobacco abuse. St Jude ICD.   Lexiscan Cardiolite in 12/19 showed inferior infarct, no ischemia.  Echo in 12/19 showed EF 35%, mild LVH, mildly decreased RV systolic function.  ABIs in 12/19 were stable compared to the past.    In the fall of 2020, he was diagnosed with non-small cell lung cancer.  He has been treated with radiation and carboplatin/palclitaxel.  He has lost over 20 lbs.  He was admitted 1/21 at [facility] with ICD shocks.  These were found to be inappropriate shocks probably due to atrial fibrillation.  He is now on amiodarone and Eliquis.    TEE in 3/21 was done to assess mitral regurgitation, showing EF 25-30%, mildly decreased RV systolic function, 3+ likely ischemic MR.   Had recent clinic f/u with Dr. Aundra Dubin 09/29/19. Was doing fairly well. Had quit smoking. NYHA Class II-III. Entresto was added to regimen and furosemide was reduced to 40 mg once daily. BMP followed and renal function and potassium both remained stable. There was also concern regarding medication compliance and he was referred to paramedicine.   Recently presented to HF Clinic with Lyda Jester PA-C on 10/30/2019. Reported doing well. Tolerating Entresto ok. Weight was up 3 lb from previous visit but Corvue ok. Impedence above reference curve. No AT/AF. No VT/VF. BP 116/64 (just took am meds). Denied dizziness, syncope/ near syncope. Weight had been stable at home. Remained NYHA Class II-III. Denied CP. No ICD shocks.    Recent admission for symptomatic anemia,  HgB 6.5 (down from 10.7) on 11/14/19. Patient received 2 units  of PRBC. GI was consulted, underwent endoscopy as well as a colonoscopy on 11/16/19 without clear source of bleed but it did show hemorrhoids. There were several polyps found in the colon, status post removal. GI recommended to hold Eliquis for 3 days following the colonoscopy, and he was instructed to resume the Eliquis on 11/19/19.  Today he returns to HF clinic for pharmacist medication titration. At last visit with PA-C, furosemide was held for 2 days and then decreased to 40 mg every other day for elevated Scr. Delene Loll was held for low blood pressures on recent admit due to symptomatic anemia. Overall he is feeling well today. No dizziness, lightheadedness, chest pain or palpitations. No abnormal bleeding/bruising or blood in urine/stool with Eliquis. Can walk about 1/2 mile before needing to stop. Walks with a cane. He weighs himself daily at home and his weight has been stable at 192-195 lbs. No LEE, PND or orthopnea. His appetite is good. Taking all medications as prescribed and tolerating all medications. His home health nurse helps him with medications and fills his pill box.   HF Medications: Metoprolol succinate 50 mg BID Entresto 24/26 mg BID - currently held Spironolactone 25 mg daily Hydralazine 12.5 mg TID Furosemide 40 mg every other day Potassium chloride 20 mEq daily  Has the patient been experiencing any side effects to the medications prescribed?  no  Does the patient have any problems obtaining medications due to transportation or finances?   No. Has combined [employer] Medicare and Medicaid.    Understanding of regimen: good Understanding  of indications: good Potential of compliance: fair Patient understands to avoid NSAIDs. Patient understands to avoid decongestants.    Pertinent Lab Values (11/18/19): Marland Kitchen Serum creatinine 1.72, BUN 24, Potassium 3.9, Sodium 137  Vital Signs: . Weight: 200.4 lbs (last clinic weight: 201.4 lbs) . Blood pressure: 118/68  . Heart rate:  72   Assessment: 1. CAD s/p CABG: Had cath in 10/16 with patent grafts, medical management.  Cardiolite in 12/19 showed no ischemia (just prior infarction).  - denies anginal symptoms  - Continue statin + ? blocker - off ASA due to Eliquis  2. Chronic systolic HF: Ischemic cardiomyopathy, St Jude ICD. Echo 12/19 with EF 35%.   - chronically NYHA Class II-III, think he is primarily limited by deconditioning and COPD.  - Euvolemic on exam    - Vitals: BP 118/68, HR 72 - Decrease furosemide to 20 mg daily.  - Continue Metoprolol succinate 50 mg BID - Restart Entresto 24-26 mg BID. Repeat BMET in 2 weeks. - Continue spironolactone 25 mg daily.  - Continue hydralazine 12.5 mg BID.  3. COPD/smoking: He has now quit smoking. He was congratulated on his efforts.  4. PAD: Stable claudication and stable ABIs in 12/19. He tries to walk through the pain.   - encouraged to continue walking  - Continue statin.   - Repeat LE arterial dopplers w/ ABIs 5. CKD: Stage 3.   - Last Scr 1.72  6. Atrial fibrillation: Paroxysmal.  RRR on exam. Had inappropriate ICD shocks in setting of atrial fibrillation with RVR in the past  - device interrogation shows no AT/AF episodes. No VT/VF - Continue Eliquis. Recent admission for symptomatic anemia (HgB decreased to 6.5). Eliquis was held, then restarted.  - Continue amiodarone, 200 mg daily. Follow LFTs and TSH q6 months (TSH 2/21 and CMP 3/21 ok), will need regular eye exam.  7. Elevated LFTs: Resolved on 3/21 labs.  8. Lung cancer: NSCLC.  Radiation is completed, still getting chemotherapy   Plan: 1) Medication changes: Based on clinical presentation, vital signs and recent labs will Restart Entresto 24/26 mg BID now that BP has improved after recent admission for GIB. Decrease furosemide to 20 mg daily.  2) Labs: Scr 1.72, K 3.9 3) Follow-up: 4 weeks with Pharmacy Clinic   Audry Riles, PharmD, BCPS, BCCP, CPP Heart Failure Clinic  Pharmacist [phone]

## 2019-11-17 NOTE — Plan of Care (Signed)
  Problem: Activity: Goal: Risk for activity intolerance will decrease Outcome: Progressing   

## 2019-11-17 NOTE — Progress Notes (Signed)
PROGRESS NOTE  [name] [name]. FYB:[id number] DOB: 08-20-1950 DOA: 11/14/2019 PCP: [name], PA-C   LOS: 2 days   Brief Narrative / Interim history: [name] Johnsis a 69 y.o. A.A. male with a pertinent medical history of hypertension, hyperlipidemia, GERD, COPD,type 2diabetes mellitus, combined systolic/diastolicCHFwith EF of 52-77%, CADs/p 3VCABG , s/p St. Jude AICD,PAD,chronic anemia, A.fib(on Eliquis),history of tobacco abuseand stage 3 non-small celllung cancer without current chemo/radiation therapy who presents to ED sent by PCP forunspecifiedabnormal lab values.   On ED encounter, Hgb 6.5 down from 10.7 on 3/19, BUN 34, creatinine 1.74, positive stool guaiacs.  Over the last several days, he has had mild weakness, leg swelling and constipation. Baseline chronic SOBwith marked dyspnea on exertion.All improved at this time.   Subjective / 24h Interval events: [name] [name] was resting in bed on morning rounds. He feels well after his EGD and colonoscopy yesterday, has resumed PO intake without nausea or vomiting. I discussed procedure findings with him and the lack of a GI bleed, as well as the need to replete his iron. Patient is agreeable.   Assessment & Plan: Principal Problem Symptomatic anemia - History of chronic anemia with 2 units PRBCs given on 5/25 upon admission. Hgb has improved appropriately and is holding. EGD and colonoscopy did not indicate any source for GI bleed. Eliquis being held per GI recommendation until 5/31 due to s/p polypectomy. IV iron repletion on 5/27. Continue to monitor CBC daily.   Active Problems Chronic kidney disease stage IIIa - baseline creatinine around 1.6-1.8, currently 1.94 on 5/28. Upwards trend around baseline likely in the setting of hypotension with NPO status. Hold hypertension meds except for metoprolol at this time.   Chronic combined systolic /diastolic CHF - Continues to appear euvolemic at this time with continued  mild hypotension. Hold furosemide. Administer 1 time 586mL NS bolus to stave hypotension. Continue beta-blocker and spironolactone per holding parameters.  Hyperlipidemia - Continue statin.  Essential hypertension - Continue beta-blocker and spironolactone per holding parameters. Patient recently with soft BP. Discussed case with cardiology and they recommend outpatient follow-up.  Paroxysmal A. Fib - Holding Eliquis s/p polypectomies until 5/31 per GI recommendations. IV heparin if anticoagulation is required before then. Continue SCDs, amiodarone.   COPD without exacerbation - No wheezing, on room air, without dyspnea again today.  History of non-small cell lung cancer - Outpatient follow-up with Dr. Julien Nordmann.  Scheduled Meds: . allopurinol  300 mg Oral Daily  . amiodarone  200 mg Oral Daily  . atorvastatin  20 mg Oral Daily  . ferrous gluconate  324 mg Oral Q breakfast  . ketotifen  1 drop Left Eye BID  . melatonin  3 mg Oral QHS  . metoprolol succinate  50 mg Oral BID  . potassium chloride SA  20 mEq Oral Q1200  . sucralfate  1 g Oral QID  . umeclidinium bromide  1 puff Inhalation Daily   Continuous Infusions: PRN Meds:.acetaminophen **OR** acetaminophen, albuterol, guaiFENesin-dextromethorphan, oxyCODONE-acetaminophen  DVT prophylaxis: SCDs. Per GI, do not resume Eliquis for 72 hrs minimum s/p polypectomy, or on 5/31 Code Status: Full code Family Communication: No family at bedside.  Status is: Inpatient  Remains inpatient appropriate because:IV treatments appropriate due to intensity of illness or inability to take PO and Inpatient level of care appropriate due to severity of illness   Dispo: The patient is from: Home              Anticipated d/c is to: Home  Anticipated d/c date is: 1 day              Patient currently is not medically stable to d/c.   Consultants:  GI, much appreciated.  Procedures:  2D echo: None Foley: None BiPAP: None HD: None   Microbiology  Covid negative  Antimicrobials: None.  Objective: Vitals:   11/16/19 1224 11/16/19 1704 11/16/19 2141 11/17/19 0432  BP: 104/64 101/64 (!) 91/57 (!) 102/55  Pulse: 61  66 65  Resp: 16 16 18 17   Temp:  97.8 F (36.6 C) 98.6 F (37 C) 98 F (36.7 C)  TempSrc:  Oral Oral Oral  SpO2: 100% 98% 97% 97%  Weight:      Height:        Intake/Output Summary (Last 24 hours) at 11/17/2019 1037 Last data filed at 11/17/2019 0547 Gross per 24 hour  Intake 1460 ml  Output 850 ml  Net 610 ml   Filed Weights   11/14/19 1122 11/14/19 2212  Weight: 91.2 kg 87.3 kg    Examination:  Constitutional: NAD Eyes: no scleral icterus ENMT: Mucous membranes are moist.  Neck: normal, supple Respiratory: clear to auscultation bilaterally, no wheezing, no crackles. Normal respiratory effort. No accessory muscle use.  Cardiovascular: Regular rate and rhythm, no murmurs / rubs / gallops. No LE edema. Good peripheral pulses Abdomen: non distended, no tenderness. Bowel sounds positive.  Musculoskeletal: no clubbing / cyanosis.  Skin: no rashes Neurologic: grossly non-focal. Psychiatric: Normal judgment and insight. Alert and oriented x 3. Normal mood.    Data Reviewed: I have independently reviewed following labs and imaging studies   CBC: Recent Labs  Lab 11/14/19 1136 11/15/19 0503 11/16/19 0322 11/17/19 0400  WBC 5.9 5.3 6.5 7.6  HGB 6.5* 8.8* 9.4* 8.9*  HCT 22.9* 29.6* 32.6* 30.3*  MCV 82.7 82.5 85.3 83.7  PLT 238 221 238 517   Basic Metabolic Panel: Recent Labs  Lab 11/14/19 1136 11/15/19 0503 11/16/19 0322 11/17/19 0400  NA 137 139 139 138  K 3.4* 3.4* 3.8 3.8  CL 105 103 108 105  CO2 24 23 20* 23  GLUCOSE 121* 85 77 89  BUN 34* 28* 26* 31*  CREATININE 1.74* 1.57* 1.85* 1.94*  CALCIUM 9.5 9.3 9.3 9.6   Liver Function Tests: Recent Labs  Lab 11/14/19 1309 11/16/19 0322  AST 29 29  ALT 33 41  ALKPHOS 65 62  BILITOT 0.4 0.7  PROT 6.5 6.2*   ALBUMIN 3.1* 3.0*   Coagulation Profile: No results for input(s): INR, PROTIME in the last 168 hours. HbA1C: No results for input(s): HGBA1C in the last 72 hours. CBG: Recent Labs  Lab 11/16/19 0959 11/16/19 1020  GLUCAP 71 119*    Recent Results (from the past 240 hour(s))  SARS Coronavirus 2 by RT PCR (hospital order, performed in [facility] hospital lab) Nasopharyngeal Nasopharyngeal Swab     Status: None   Collection Time: 11/14/19  3:47 PM   Specimen: Nasopharyngeal Swab  Result Value Ref Range Status   SARS Coronavirus 2 NEGATIVE NEGATIVE Final    Comment: (NOTE) SARS-CoV-2 target nucleic acids are NOT DETECTED. The SARS-CoV-2 RNA is generally detectable in upper and lower respiratory specimens during the acute phase of infection. The lowest concentration of SARS-CoV-2 viral copies this assay can detect is 250 copies / mL. A negative result does not preclude SARS-CoV-2 infection and should not be used as the sole basis for treatment or other patient management decisions.  A negative result may  occur with improper specimen collection / handling, submission of specimen other than nasopharyngeal swab, presence of viral mutation(s) within the areas targeted by this assay, and inadequate number of viral copies (<250 copies / mL). A negative result must be combined with clinical observations, patient history, and epidemiological information. Fact Sheet for Patients:   StrictlyIdeas.no Fact Sheet for Healthcare Providers: BankingDealers.co.za This test is not yet approved or cleared  by the [place] FDA and has been authorized for detection and/or diagnosis of SARS-CoV-2 by FDA under an Emergency Use Authorization (EUA).  This EUA will remain in effect (meaning this test can be used) for the duration of the COVID-19 declaration under Section 564(b)(1) of the Act, 21 U.S.C. section 360bbb-3(b)(1), unless the authorization is  terminated or revoked sooner. Performed at [facility], [place] [address], [place], [place] [address]      Radiology Studies: No results found.      Marzetta Board, MD, PhD [facility]  Between 7 am - 7 pm I am available, please contact me via Amion or Securechat  Between 7 pm - 7 am I am not available, please contact night coverage MD/APP via Amion    [email or web address]

## 2019-11-17 NOTE — Anesthesia Postprocedure Evaluation (Signed)
Anesthesia Post Note  Patient: Hendrix Yurkovich.  Procedure(s) Performed: COLONOSCOPY WITH PROPOFOL (N/A ) ESOPHAGOGASTRODUODENOSCOPY (EGD) WITH PROPOFOL (N/A ) POLYPECTOMY BIOPSY HEMOSTASIS CLIP PLACEMENT     Patient location during evaluation: PACU Anesthesia Type: MAC Level of consciousness: awake and alert Pain management: pain level controlled Vital Signs Assessment: post-procedure vital signs reviewed and stable Respiratory status: spontaneous breathing, nonlabored ventilation, respiratory function stable and patient connected to nasal cannula oxygen Cardiovascular status: stable and blood pressure returned to baseline Postop Assessment: no apparent nausea or vomiting Anesthetic complications: no    Last Vitals:  Vitals:   11/16/19 2141 11/17/19 0432  BP: (!) 91/57 (!) 102/55  Pulse: 66 65  Resp: 18 17  Temp: 37 C 36.7 C  SpO2: 97% 97%    Last Pain:  Vitals:   11/17/19 0432  TempSrc: Oral  PainSc:                  Effie Berkshire

## 2019-11-18 ENCOUNTER — Telehealth: Payer: Self-pay

## 2019-11-18 LAB — CBC
HCT: 29.3 % — ABNORMAL LOW (ref 39.0–52.0)
Hemoglobin: 8.5 g/dL — ABNORMAL LOW (ref 13.0–17.0)
MCH: 24.4 pg — ABNORMAL LOW (ref 26.0–34.0)
MCHC: 29 g/dL — ABNORMAL LOW (ref 30.0–36.0)
MCV: 84 fL (ref 80.0–100.0)
Platelets: 195 10*3/uL (ref 150–400)
RBC: 3.49 MIL/uL — ABNORMAL LOW (ref 4.22–5.81)
RDW: 18.7 % — ABNORMAL HIGH (ref 11.5–15.5)
WBC: 7.3 10*3/uL (ref 4.0–10.5)
nRBC: 0 % (ref 0.0–0.2)

## 2019-11-18 LAB — COMPREHENSIVE METABOLIC PANEL
ALT: 28 U/L (ref 0–44)
AST: 22 U/L (ref 15–41)
Albumin: 2.9 g/dL — ABNORMAL LOW (ref 3.5–5.0)
Alkaline Phosphatase: 57 U/L (ref 38–126)
Anion gap: 9 (ref 5–15)
BUN: 24 mg/dL — ABNORMAL HIGH (ref 8–23)
CO2: 20 mmol/L — ABNORMAL LOW (ref 22–32)
Calcium: 9.2 mg/dL (ref 8.9–10.3)
Chloride: 108 mmol/L (ref 98–111)
Creatinine, Ser: 1.72 mg/dL — ABNORMAL HIGH (ref 0.61–1.24)
GFR calc Af Amer: 46 mL/min — ABNORMAL LOW (ref >60)
GFR calc non Af Amer: 40 mL/min — ABNORMAL LOW (ref >60)
Glucose, Bld: 89 mg/dL (ref 70–99)
Potassium: 3.9 mmol/L (ref 3.5–5.1)
Sodium: 137 mmol/L (ref 135–145)
Total Bilirubin: 0.5 mg/dL (ref 0.3–1.2)
Total Protein: 5.8 g/dL — ABNORMAL LOW (ref 6.5–8.1)

## 2019-11-18 MED ORDER — FERROUS GLUCONATE 324 (38 FE) MG PO TABS: 324.0000 mg | ORAL_TABLET | Freq: Every day | ORAL | 1 refills | Status: AC

## 2019-11-18 NOTE — Discharge Summary (Signed)
Physician Discharge Summary  [name] [name]. LNL:[id number] DOB: [birth year] DOA: 11/14/2019  PCP: [name], PA-C  Admit date: 11/14/2019 Discharge date: 11/18/2019  Admitted From: Home Disposition: Home  Recommendations for Outpatient Follow-up:  1. Follow up with heart failure team as scheduled in a week 2. Patient was instructed to hold Eliquis up until 11/19/2019 per GI recommendations  Home Health: None Equipment/Devices: None  Discharge Condition: Stable CODE STATUS: Full code Diet recommendation: Low-sodium, heart healthy  HPI: Per admitting MD, [name] [name]. is a 69 y.o. male with medical history significant of HTN, HLD, stage III non-small cell lung cancer, combined systolic and diastolic CHF last EF noted to be 25-30%, CAD s/p 3V CABG, s/p [employer] AICD, PAF, PAD, history of tobacco abuse, And GERD presents after having normal lab work instructed by his primary care provider to come into the hospital.  Yesterday, patient had a routine follow-up visit with his primary care provider and basic labs were obtained. Over the last few days he notes that he said some mild weakness,  leg swelling, and constipation.  He chronically has shortness of breath that he reports is unchanged.  Patient denies having any blood in stools, chest pain, lightheadedness, abdominal pain, diarrhea, or recent NSAID use.  Records notes that he also had to be transfused blood products again in February of this year.  He is on blood thinners with Eliquis.  Patient does not recall if he has however had a colonoscopy or not.  He states that he has completed radiation treatment for lung cancer. ED Course: Upon admission into the emergency department patient was seen to have blood pressures as low as 99/46 other vital signs maintained. Labs significant for hemoglobin 6.5(previously 10.7 on 3/19), BUN 34, and creatinine 1.74. Stool guaiacs were noted to be positive. Patient was typed and screened and ordered 2  units of packed red blood cells. TRH called to admit.  Hospital Course / Discharge diagnoses: Symptomatic anemia-patient was transfused 2 units of packed red blood cells, hemoglobin improved appropriately and has remained stable, and clinically he is feeling better. There was a concern for GI bleed given positive fecal occult and long-term anticoagulation.  GI consulted, underwent endoscopy as well as a colonoscopy on 5/27 without clear source of bleed but it did show hemorrhoids.  There were several polyps found in the colon, status post removal.  GI recommends to hold Eliquis for 3 days following the colonoscopy, and he is to resume the Eliquis on 5/30.  EGD showed some white nummular lesions in the esophageal mucosa as well as erythematous mucosa in the gastric body, biopsied, results will need follow-up as an outpatient by gastroenterology Chronic kidney disease stage IIIa-Baseline creatinine 1.6-1.8, increased to briefly at 1.9 in the setting of intermittent hypotension in the hospital, but improved when Entresto was discontinued Chronic combined systolic/diastolic CHF-appears euvolemic, his Entresto and diuretics have been temporarily held, case was discussed with heart failure team over the phone and due to intermittent hypotension his Delene Loll will be held at the time of discharge, patient will be seen in office within the next week and at that time will be reevaluated about resuming Entresto Hyperlipidemia-continue statin Essential hypertension-continue home medications Paroxysmal A. fib-continue amiodarone, hold Eliquis as post polypectomy per GI recommendations.  Anticoagulation to be resumed on 11/19/2019 COPD-no wheezing, respiratory status stable, on room air. History of non-small cell lung cancer-outpatient follow-up with Dr. Earlie Server  Discharge Instructions   Allergies as of 11/18/2019  Reactions   Penicillins Nausea And Vomiting, Swelling, Other (See Comments)   Per Dr Audria Nine Via phone 12/27/12 0400 SWELLING REACTION UNSPECIFIED  Did it involve swelling of the face/tongue/throat, SOB, or low BP? Unknown Did it involve sudden or severe rash/hives, skin peeling, or any reaction on the inside of your mouth or nose? Unknown Did you need to seek medical attention at a hospital or doctor's office? Unknown When did it last happen?Childhood If all above answers are "NO", may proceed with cephalosporin use.      Medication List    STOP taking these medications   sacubitril-valsartan 24-26 MG Commonly known as: ENTRESTO     TAKE these medications   acetaminophen 325 MG tablet Commonly known as: TYLENOL Take 650 mg by mouth every 6 (six) hours as needed for mild pain or moderate pain.   albuterol 1.25 MG/3ML nebulizer solution Commonly known as: ACCUNEB Take 1 ampule by nebulization every 6 (six) hours as needed for wheezing.   albuterol 108 (90 Base) MCG/ACT inhaler Commonly known as: VENTOLIN HFA Inhale 1-2 puffs into the lungs every 6 (six) hours as needed for wheezing or shortness of breath.   allopurinol 300 MG tablet Commonly known as: ZYLOPRIM Take 300 mg by mouth daily.   amiodarone 400 MG tablet Commonly known as: PACERONE Take 0.5 tablets (200 mg total) by mouth daily.   atorvastatin 20 MG tablet Commonly known as: LIPITOR Take 20 mg by mouth daily.   azelastine 0.05 % ophthalmic solution Commonly known as: OPTIVAR Place 1 drop into the left eye 2 (two) times daily.   b complex vitamins tablet Take 1 tablet by mouth at bedtime.   chlorproMAZINE 25 MG tablet Commonly known as: THORAZINE Take 1 tablet (25 mg total) by mouth 3 (three) times daily as needed. What changed: reasons to take this   Eliquis 5 MG Tabs tablet Generic drug: apixaban Take 5 mg by mouth 2 (two) times daily.   ferrous gluconate 324 MG tablet Commonly known as: FERGON Take 1 tablet (324 mg total) by mouth daily with breakfast.   furosemide 40 MG  tablet Commonly known as: LASIX Take 1 tablet (40 mg total) by mouth daily.   guaiFENesin-dextromethorphan 100-10 MG/5ML syrup Commonly known as: ROBITUSSIN DM Take 5 mLs by mouth every 4 (four) hours as needed for cough.   hydrALAZINE 25 MG tablet Commonly known as: APRESOLINE Take 0.5 tablets (12.5 mg total) by mouth 3 (three) times daily.   melatonin 3 MG Tabs tablet Take 3 mg by mouth at bedtime.   metoprolol succinate 50 MG 24 hr tablet Commonly known as: TOPROL-XL Take 50 mg by mouth in the morning and at bedtime.   nitroGLYCERIN 0.4 MG SL tablet Commonly known as: NITROSTAT Place 0.4 mg under the tongue every 5 (five) minutes as needed for chest pain.   omeprazole 40 MG capsule Commonly known as: PRILOSEC Take 40 mg by mouth daily.   potassium chloride SA 20 MEQ tablet Commonly known as: KLOR-CON Take 1 tablet (20 mEq total) by mouth daily. What changed: when to take this   promethazine 25 MG tablet Commonly known as: PHENERGAN Take 25 mg by mouth every 6 (six) hours as needed for nausea or vomiting.   Rena-Vite Rx 1 MG Tabs Take 1 tablet by mouth daily.   senna 8.6 MG Tabs tablet Commonly known as: SENOKOT Take 1 tablet (8.6 mg total) by mouth 2 (two) times daily. What changed: when to take this  Spiriva HandiHaler 18 MCG inhalation capsule Generic drug: tiotropium Place 1 capsule into inhaler and inhale daily.   spironolactone 25 MG tablet Commonly known as: ALDACTONE TAKE 1/2 TABLET BY MOUTH EVERY DAY What changed: how much to take   sucralfate 1 g tablet Commonly known as: Carafate Take 1 tablet (1 g total) by mouth 4 (four) times daily -  with meals and at bedtime. 5 min before meals for radiation induced esophagitis What changed: when to take this   tiZANidine 4 MG capsule Commonly known as: ZANAFLEX Take 4 mg by mouth 3 (three) times daily as needed for muscle spasms.   Vitamin D3 25 MCG (1000 UT) Caps Take 1,000 Units by mouth daily at 12  noon.      Follow-up Information    [name], PA-C. Schedule an appointment as soon as possible for a visit in 1 week(s).   Specialty: Physician Assistant Contact information: [place] [address] [phone]        Larey Dresser, MD .   Specialty: Cardiology Contact information: [phone] [place] [address] [phone]        Deboraha Sprang, MD .   Specialty: Cardiology Contact information: [phone] [address] [place] [address] [phone]           Consultations:  GI  Procedures/Studies:  Colonoscopy  Endoscopy  DG Chest Portable 1 View  Result Date: 11/14/2019 CLINICAL DATA:  Anemia EXAM: PORTABLE CHEST 1 VIEW COMPARISON:  September 08, 2019 chest CT; chest radiograph July 01, 2019. FINDINGS: There is extensive fibrosis in the left perihilar region, and to a lesser extent in the right perihilar region. There is no frank edema or consolidation. Note that a portion of the left upper lobe is obscured by a pacemaker device. Pacemaker lead is attached to the right ventricle. Heart is upper normal in size. There is distortion of pulmonary vascularity due to fibrosis bilaterally. Patient is status post coronary artery bypass grafting. No adenopathy is appreciable by radiography. There is aortic atherosclerosis. No bone lesions. IMPRESSION: Perihilar fibrosis bilaterally, more severe on the left than on the right. No edema or consolidation evident. Heart upper normal in size. No adenopathy appreciable by radiography. Pacer lead tip in right ventricle. Aortic Atherosclerosis (ICD10-I70.0). Electronically Signed   By: Lowella Grip III M.D.   On: 11/14/2019 14:52   VAS [place] ABI WITH/WO TBI  Result Date: 11/14/2019 LOWER EXTREMITY DOPPLER STUDY Indications: Claudication, and today patient denies any pain in his legs when              walking. His chief complaint today is increase swelling and pain in               the left foot x 2 months. The swelling has dissipated, but the pain              remains at the ankles. He also c/o of tingling sensations in both              feet and intermittent "shooting" pain that start at the heel to the              toes, left worse than right. Patient does state he has a history of              gout. High Risk Factors: Hypertension, past history of smoking, coronary artery  disease. Other Factors: CABG x 5, COPD.  Comparison Study: In 05/2018, an arterial Doppler showed an ABI of .75 on the                   right and .70 on the left. Performing Technologist: Sharlett Iles RVT  Examination Guidelines: A complete evaluation includes at minimum, Doppler waveform signals and systolic blood pressure reading at the level of bilateral brachial, anterior tibial, and posterior tibial arteries, when vessel segments are accessible. Bilateral testing is considered an integral part of a complete examination. Photoelectric Plethysmograph (PPG) waveforms and toe systolic pressure readings are included as required and additional duplex testing as needed. Limited examinations for reoccurring indications may be performed as noted.  ABI Findings: +---------+------------------+-----+----------+--------+ Right    Rt Pressure (mmHg)IndexWaveform  Comment  +---------+------------------+-----+----------+--------+ Brachial 115                                       +---------+------------------+-----+----------+--------+ ATA      74                0.63 monophasic         +---------+------------------+-----+----------+--------+ PTA      83                0.70 monophasic         +---------+------------------+-----+----------+--------+ PERO     74                0.63 monophasic         +---------+------------------+-----+----------+--------+ Great Toe48                0.41 Abnormal           +---------+------------------+-----+----------+--------+  +---------+------------------+-----+----------+-------+ Left     Lt Pressure (mmHg)IndexWaveform  Comment +---------+------------------+-----+----------+-------+ Brachial 118                                      +---------+------------------+-----+----------+-------+ ATA      81                0.69 monophasic        +---------+------------------+-----+----------+-------+ PTA      77                0.65 monophasic        +---------+------------------+-----+----------+-------+ PERO     56                0.47 monophasic        +---------+------------------+-----+----------+-------+ Great Toe54                0.46 Abnormal          +---------+------------------+-----+----------+-------+ +-------+-----------+-----------+------------+------------+ ABI/TBIToday's ABIToday's TBIPrevious ABIPrevious TBI +-------+-----------+-----------+------------+------------+ Right  .70        .41        .75         .18          +-------+-----------+-----------+------------+------------+ Left   .69        .46        .70         .26          +-------+-----------+-----------+------------+------------+ Bilateral ABIs appear essentially unchanged compared to prior study on 06/07/2018. Bilateral TBIs appear increased compared to prior study on 06/07/2018.  Summary: Right: Resting right ankle-brachial index indicates moderate  right lower extremity arterial disease. The right toe-brachial index is abnormal. TBIs increased by .23. Left: Resting left ankle-brachial index indicates moderate left lower extremity arterial disease. The left toe-brachial index is abnormal. TBIs increased by .20.  *See table(s) above for measurements and observations.  Electronically signed by Carlyle Dolly MD on 11/14/2019 at 5:42:01 PM.    Final       Subjective: - no chest pain, shortness of breath, no abdominal pain, nausea or vomiting.   Discharge Exam: BP 102/70 (BP Location: Right Arm)   Pulse 68    Temp 98.4 F (36.9 C) (Oral)   Resp 18   Ht 5\' 7"  (1.702 m)   Wt 90.6 kg   SpO2 99%   BMI 31.28 kg/m   General: Pt is alert, awake, not in acute distress Cardiovascular: RRR, S1/S2 +, no rubs, no gallops Respiratory: CTA bilaterally, no wheezing, no rhonchi Abdominal: Soft, NT, ND, bowel sounds + Extremities: no edema, no cyanosis    The results of significant diagnostics from this hospitalization (including imaging, microbiology, ancillary and laboratory) are listed below for reference.     Microbiology: Recent Results (from the past 240 hour(s))  SARS Coronavirus 2 by RT PCR (hospital order, performed in [facility] hospital lab) Nasopharyngeal Nasopharyngeal Swab     Status: None   Collection Time: 11/14/19  3:47 PM   Specimen: Nasopharyngeal Swab  Result Value Ref Range Status   SARS Coronavirus 2 NEGATIVE NEGATIVE Final    Comment: (NOTE) SARS-CoV-2 target nucleic acids are NOT DETECTED. The SARS-CoV-2 RNA is generally detectable in upper and lower respiratory specimens during the acute phase of infection. The lowest concentration of SARS-CoV-2 viral copies this assay can detect is 250 copies / mL. A negative result does not preclude SARS-CoV-2 infection and should not be used as the sole basis for treatment or other patient management decisions.  A negative result may occur with improper specimen collection / handling, submission of specimen other than nasopharyngeal swab, presence of viral mutation(s) within the areas targeted by this assay, and inadequate number of viral copies (<250 copies / mL). A negative result must be combined with clinical observations, patient history, and epidemiological information. Fact Sheet for Patients:   StrictlyIdeas.no Fact Sheet for Healthcare Providers: BankingDealers.co.za This test is not yet approved or cleared  by the [place] FDA and has been authorized for detection and/or  diagnosis of SARS-CoV-2 by FDA under an Emergency Use Authorization (EUA).  This EUA will remain in effect (meaning this test can be used) for the duration of the COVID-19 declaration under Section 564(b)(1) of the Act, 21 U.S.C. section 360bbb-3(b)(1), unless the authorization is terminated or revoked sooner. Performed at [facility], [place] [address] [place], [place] [address]      Labs: Basic Metabolic Panel: Recent Labs  Lab 11/14/19 1136 11/15/19 0503 11/16/19 0322 11/17/19 0400 11/18/19 0350  NA 137 139 139 138 137  K 3.4* 3.4* 3.8 3.8 3.9  CL 105 103 108 105 108  CO2 24 23 20* 23 20*  GLUCOSE 121* 85 77 89 89  BUN 34* 28* 26* 31* 24*  CREATININE 1.74* 1.57* 1.85* 1.94* 1.72*  CALCIUM 9.5 9.3 9.3 9.6 9.2   Liver Function Tests: Recent Labs  Lab 11/14/19 1309 11/16/19 0322 11/18/19 0350  AST 29 29 22   ALT 33 41 28  ALKPHOS 65 62 57  BILITOT 0.4 0.7 0.5  PROT 6.5 6.2* 5.8*  ALBUMIN 3.1* 3.0* 2.9*   CBC:  Recent Labs  Lab 11/14/19 1136 11/15/19 0503 11/16/19 0322 11/17/19 0400 11/18/19 0350  WBC 5.9 5.3 6.5 7.6 7.3  HGB 6.5* 8.8* 9.4* 8.9* 8.5*  HCT 22.9* 29.6* 32.6* 30.3* 29.3*  MCV 82.7 82.5 85.3 83.7 84.0  PLT 238 221 238 209 195   CBG: Recent Labs  Lab 11/16/19 0959 11/16/19 1020  GLUCAP 71 119*   Hgb A1c No results for input(s): HGBA1C in the last 72 hours. Lipid Profile No results for input(s): CHOL, HDL, LDLCALC, TRIG, CHOLHDL, LDLDIRECT in the last 72 hours. Thyroid function studies No results for input(s): TSH, T4TOTAL, T3FREE, THYROIDAB in the last 72 hours.  Invalid input(s): FREET3 Urinalysis    Component Value Date/Time   COLORURINE YELLOW 02/14/2019 [place] 02/14/2019 1443   LABSPEC 1.010 02/14/2019 1443   PHURINE 5.0 02/14/2019 1443   GLUCOSEU NEGATIVE 02/14/2019 1443   HGBUR NEGATIVE 02/14/2019 1443   BILIRUBINUR NEGATIVE 02/14/2019 1443   KETONESUR NEGATIVE 02/14/2019 1443   PROTEINUR NEGATIVE  02/14/2019 1443   UROBILINOGEN 4.0 (H) 05/04/2013 2202   NITRITE NEGATIVE 02/14/2019 1443   LEUKOCYTESUR NEGATIVE 02/14/2019 1443    FURTHER DISCHARGE INSTRUCTIONS:   Get Medicines reviewed and adjusted: Please take all your medications with you for your next visit with your Primary MD   Laboratory/radiological data: Please request your Primary MD to go over all hospital tests and procedure/radiological results at the follow up, please ask your Primary MD to get all Hospital records sent to his/her office.   In some cases, they will be blood work, cultures and biopsy results pending at the time of your discharge. Please request that your primary care M.D. goes through all the records of your hospital data and follows up on these results.   Also Note the following: If you experience worsening of your admission symptoms, develop shortness of breath, life threatening emergency, suicidal or homicidal thoughts you must seek medical attention immediately by calling 911 or calling your MD immediately  if symptoms less severe.   You must read complete instructions/literature along with all the possible adverse reactions/side effects for all the Medicines you take and that have been prescribed to you. Take any new Medicines after you have completely understood and accpet all the possible adverse reactions/side effects.    Do not drive when taking Pain medications or sleeping medications (Benzodaizepines)   Do not take more than prescribed Pain, Sleep and Anxiety Medications. It is not advisable to combine anxiety,sleep and pain medications without talking with your primary care practitioner   Special Instructions: If you have smoked or chewed Tobacco  in the last 2 yrs please stop smoking, stop any regular Alcohol  and or any Recreational drug use.   Wear Seat belts while driving.   Please note: You were cared for by a hospitalist during your hospital stay. Once you are discharged, your primary  care physician will handle any further medical issues. Please note that NO REFILLS for any discharge medications will be authorized once you are discharged, as it is imperative that you return to your primary care physician (or establish a relationship with a primary care physician if you do not have one) for your post hospital discharge needs so that they can reassess your need for medications and monitor your lab values.  Time coordinating discharge: 40 minutes  SIGNED:  Marzetta Board, MD, PhD 11/18/2019, 8:32 AM

## 2019-11-18 NOTE — Discharge Instructions (Signed)
Follow with [name] Bender, PA-C in 2-3 weeks Follow-up with heart failure as scheduled Follow-up with GI in 2 to 4 weeks  Please hold Apixaban (Eliquis) on the day of discharge and you can resume it on Sunday, 11/19/2019  Please get a complete blood count and chemistry panel checked by your Primary MD at your next visit, and again as instructed by your Primary MD. Please get your medications reviewed and adjusted by your Primary MD.  Please request your Primary MD to go over all Hospital Tests and Procedure/Radiological results at the follow up, please get all Hospital records sent to your Prim MD by signing hospital release before you go home.  In some cases, there will be blood work, cultures and biopsy results pending at the time of your discharge. Please request that your primary care M.D. goes through all the records of your hospital data and follows up on these results.  If you had Pneumonia of Lung problems at the Hospital: Please get a 2 view Chest X ray done in 6-8 weeks after hospital discharge or sooner if instructed by your Primary MD.  If you have Congestive Heart Failure: Please call your Cardiologist or Primary MD anytime you have any of the following symptoms:  1) 3 pound weight gain in 24 hours or 5 pounds in 1 week  2) shortness of breath, with or without a dry hacking cough  3) swelling in the hands, feet or stomach  4) if you have to sleep on extra pillows at night in order to breathe  Follow cardiac low salt diet and 1.5 lit/day fluid restriction.  If you have diabetes Accuchecks 4 times/day, Once in AM empty stomach and then before each meal. Log in all results and show them to your primary doctor at your next visit. If any glucose reading is under 80 or above 300 call your primary MD immediately.  If you have Seizure/Convulsions/Epilepsy: Please do not drive, operate heavy machinery, participate in activities at heights or participate in high speed sports until  you have seen by Primary MD or a Neurologist and advised to do so again. Per [facility] statutes, patients with seizures are not allowed to drive until they have been seizure-free for six months.  Use caution when using heavy equipment or power tools. Avoid working on ladders or at heights. Take showers instead of baths. Ensure the water temperature is not too high on the home water heater. Do not go swimming alone. Do not lock yourself in a room alone (i.e. bathroom). When caring for infants or small children, sit down when holding, feeding, or changing them to minimize risk of injury to the child in the event you have a seizure. Maintain good sleep hygiene. Avoid alcohol.   If you had Gastrointestinal Bleeding: Please ask your Primary MD to check a complete blood count within one week of discharge or at your next visit. Your endoscopic/colonoscopic biopsies that are pending at the time of discharge, will also need to followed by your Primary MD.  Get Medicines reviewed and adjusted. Please take all your medications with you for your next visit with your Primary MD  Please request your Primary MD to go over all hospital tests and procedure/radiological results at the follow up, please ask your Primary MD to get all Hospital records sent to his/her office.  If you experience worsening of your admission symptoms, develop shortness of breath, life threatening emergency, suicidal or homicidal thoughts you must seek medical attention immediately by  calling 911 or calling your MD immediately  if symptoms less severe.  You must read complete instructions/literature along with all the possible adverse reactions/side effects for all the Medicines you take and that have been prescribed to you. Take any new Medicines after you have completely understood and accpet all the possible adverse reactions/side effects.   Do not drive or operate heavy machinery when taking Pain medications.   Do not take  more than prescribed Pain, Sleep and Anxiety Medications  Special Instructions: If you have smoked or chewed Tobacco  in the last 2 yrs please stop smoking, stop any regular Alcohol  and or any Recreational drug use.  Wear Seat belts while driving.  Please note You were cared for by a hospitalist during your hospital stay. If you have any questions about your discharge medications or the care you received while you were in the hospital after you are discharged, you can call the unit and asked to speak with the hospitalist on call if the hospitalist that took care of you is not available. Once you are discharged, your primary care physician will handle any further medical issues. Please note that NO REFILLS for any discharge medications will be authorized once you are discharged, as it is imperative that you return to your primary care physician (or establish a relationship with a primary care physician if you do not have one) for your aftercare needs so that they can reassess your need for medications and monitor your lab values.  You can reach the hospitalist office at phone [phone] or fax [phone]   If you do not have a primary care physician, you can call [phone] for a physician referral.  Activity: As tolerated with Full fall precautions use walker/cane & assistance as needed    Diet: low sodium, heart healthy  Disposition Home

## 2019-11-18 NOTE — Telephone Encounter (Signed)
Dlphane, Mr. Crickenberger sister had called the ED after being directed there by someone on inpatient. I received the message to call her back from ED secretary.SHe was concerned because someone had called statinghis home health orders were not renewed.   Using PING and Epic, I looked up the history of  Home health and saw in his care management discharge note that he utilized [facility] services from [facility], but he had never mentioned that he also used [facility]. , which was evident in [facility]. I called His sister Dlphane, who i is listed as his contact in the chart and is his caregiver back. She stated that he does use [facility] health and this is these are the ones who called her. I explained what happened and that she would need to call his primary care physician to reorder the services on Tuesday. I made sure she knew who the primary was and that she had contact information for them.

## 2019-11-18 NOTE — Progress Notes (Signed)
DISCHARGE NOTE HOME [name] [name]. to be discharged Home per MD order. Discussed prescriptions and follow up appointments with the patient. Prescriptions given to patient; medication list explained in detail. Patient verbalized understanding.  Skin clean, dry and intact without evidence of skin break down, no evidence of skin tears noted. IV catheter discontinued intact. Site without signs and symptoms of complications. Dressing and pressure applied. Pt denies pain at the site currently. No complaints noted.  Patient free of lines, drains, and wounds.   An After Visit Summary (AVS) was printed and given to the patient. Patient escorted via wheelchair, and discharged home via private auto.  Orville Govern, RN

## 2019-11-19 DIAGNOSIS — M109 Gout, unspecified: Secondary | ICD-10-CM | POA: Diagnosis not present

## 2019-11-19 DIAGNOSIS — N1831 Chronic kidney disease, stage 3a: Secondary | ICD-10-CM | POA: Diagnosis not present

## 2019-11-19 DIAGNOSIS — Z87891 Personal history of nicotine dependence: Secondary | ICD-10-CM | POA: Diagnosis not present

## 2019-11-19 DIAGNOSIS — I130 Hypertensive heart and chronic kidney disease with heart failure and stage 1 through stage 4 chronic kidney disease, or unspecified chronic kidney disease: Secondary | ICD-10-CM | POA: Diagnosis not present

## 2019-11-19 DIAGNOSIS — E669 Obesity, unspecified: Secondary | ICD-10-CM | POA: Diagnosis not present

## 2019-11-19 DIAGNOSIS — E785 Hyperlipidemia, unspecified: Secondary | ICD-10-CM | POA: Diagnosis not present

## 2019-11-19 DIAGNOSIS — E1122 Type 2 diabetes mellitus with diabetic chronic kidney disease: Secondary | ICD-10-CM | POA: Diagnosis not present

## 2019-11-19 DIAGNOSIS — M1990 Unspecified osteoarthritis, unspecified site: Secondary | ICD-10-CM | POA: Diagnosis not present

## 2019-11-19 DIAGNOSIS — I5043 Acute on chronic combined systolic (congestive) and diastolic (congestive) heart failure: Secondary | ICD-10-CM | POA: Diagnosis not present

## 2019-11-21 ENCOUNTER — Encounter: Payer: Self-pay | Admitting: Student

## 2019-11-21 ENCOUNTER — Ambulatory Visit (INDEPENDENT_AMBULATORY_CARE_PROVIDER_SITE_OTHER): Payer: Medicare HMO | Admitting: Student

## 2019-11-21 ENCOUNTER — Other Ambulatory Visit: Payer: Self-pay

## 2019-11-21 VITALS — BP 110/68 | HR 76 | Ht 67.0 in | Wt 202.0 lb

## 2019-11-21 DIAGNOSIS — M109 Gout, unspecified: Secondary | ICD-10-CM | POA: Diagnosis not present

## 2019-11-21 DIAGNOSIS — E669 Obesity, unspecified: Secondary | ICD-10-CM | POA: Diagnosis not present

## 2019-11-21 DIAGNOSIS — N183 Chronic kidney disease, stage 3 unspecified: Secondary | ICD-10-CM

## 2019-11-21 DIAGNOSIS — Z95810 Presence of automatic (implantable) cardiac defibrillator: Secondary | ICD-10-CM

## 2019-11-21 DIAGNOSIS — I5022 Chronic systolic (congestive) heart failure: Secondary | ICD-10-CM | POA: Diagnosis not present

## 2019-11-21 DIAGNOSIS — I5043 Acute on chronic combined systolic (congestive) and diastolic (congestive) heart failure: Secondary | ICD-10-CM | POA: Diagnosis not present

## 2019-11-21 DIAGNOSIS — I4891 Unspecified atrial fibrillation: Secondary | ICD-10-CM

## 2019-11-21 DIAGNOSIS — Z87891 Personal history of nicotine dependence: Secondary | ICD-10-CM | POA: Diagnosis not present

## 2019-11-21 DIAGNOSIS — I130 Hypertensive heart and chronic kidney disease with heart failure and stage 1 through stage 4 chronic kidney disease, or unspecified chronic kidney disease: Secondary | ICD-10-CM | POA: Diagnosis not present

## 2019-11-21 DIAGNOSIS — E785 Hyperlipidemia, unspecified: Secondary | ICD-10-CM | POA: Diagnosis not present

## 2019-11-21 DIAGNOSIS — N1831 Chronic kidney disease, stage 3a: Secondary | ICD-10-CM | POA: Diagnosis not present

## 2019-11-21 DIAGNOSIS — M1990 Unspecified osteoarthritis, unspecified site: Secondary | ICD-10-CM | POA: Diagnosis not present

## 2019-11-21 DIAGNOSIS — I10 Essential (primary) hypertension: Secondary | ICD-10-CM | POA: Diagnosis not present

## 2019-11-21 DIAGNOSIS — E1122 Type 2 diabetes mellitus with diabetic chronic kidney disease: Secondary | ICD-10-CM | POA: Diagnosis not present

## 2019-11-21 LAB — CUP PACEART INCLINIC DEVICE CHECK
Battery Remaining Longevity: 54 mo
Brady Statistic RV Percent Paced: 0 %
Date Time Interrogation Session: 20210601095146
HighPow Impedance: 58.5 Ohm
Implantable Lead Implant Date: 20160115
Implantable Lead Location: 753860
Implantable Lead Model: 7122
Implantable Pulse Generator Implant Date: 20160115
Lead Channel Impedance Value: 362.5 Ohm
Lead Channel Pacing Threshold Amplitude: 1 V
Lead Channel Pacing Threshold Amplitude: 1 V
Lead Channel Pacing Threshold Pulse Width: 0.5 ms
Lead Channel Pacing Threshold Pulse Width: 0.5 ms
Lead Channel Sensing Intrinsic Amplitude: 4.8 mV
Lead Channel Setting Pacing Amplitude: 2.5 V
Lead Channel Setting Pacing Pulse Width: 0.5 ms
Lead Channel Setting Sensing Sensitivity: 0.5 mV
Pulse Gen Serial Number: 7233603

## 2019-11-21 NOTE — Patient Instructions (Addendum)
Medication Instructions:  none *If you need a refill on your cardiac medications before your next appointment, please call your pharmacy*   Lab Work: none If you have labs (blood work) drawn today and your tests are completely normal, you will receive your results only by: Marland Kitchen MyChart Message (if you have MyChart) OR . A paper copy in the mail If you have any lab test that is abnormal or we need to change your treatment, we will call you to review the results.   Testing/Procedures: none   Follow-Up: At [facility], you and your health needs are our priority.  As part of our continuing mission to provide you with exceptional heart care, we have created designated Provider Care Teams.  These Care Teams include your primary Cardiologist (physician) and Advanced Practice Providers (APPs -  Physician Assistants and Nurse Practitioners) who all work together to provide you with the care you need, when you need it.  We recommend signing up for the patient portal called "MyChart".  Sign up information is provided on this After Visit Summary.  MyChart is used to connect with patients for Virtual Visits (Telemedicine).  Patients are able to view lab/test results, encounter notes, upcoming appointments, etc.  Non-urgent messages can be sent to your provider as well.   To learn more about what you can do with MyChart, go to [email or web address].    Your next appointment:   6 months  The format for your next appointment:   Either In Person or Virtual  Provider:   Dr Caryl Comes   Other Instructions Remote monitoring is used to monitor your  ICD from home. This monitoring reduces the number of office visits required to check your device to one time per year. It allows [place] to keep an eye on the functioning of your device to ensure it is working properly. You are scheduled for a device check from home on 12/26/19. You may send your transmission at any time that day. If you have a wireless device, the  transmission will be sent automatically. After your physician reviews your transmission, you will receive a postcard with your next transmission date.

## 2019-11-21 NOTE — Progress Notes (Signed)
Electrophysiology Office Note Date: 11/21/2019  ID:  [name] [name]., DOB 1951/01/11, MRN [id number]  PCP: [name], PA-C Primary Cardiologist: Loralie Champagne, MD Electrophysiologist: [name], MD   CC: Routine ICD follow-up  [name] [name]. is a 69 y.o. male seen today for [name], MD for routine electrophysiology followup.  Since last being seen in our clinic the patient reports doing OK. Recently discharged from hospital 5/29 for symptomatic anemia. FOBT+. Colonoscopy without clear source of bleeding but + polpys and hemorrhoids. Eliquis held 3 days post colon.  He is feeling well overall. He denies chest pain, palpitations, dyspnea, PND, orthopnea, nausea, vomiting, dizziness, syncope, edema, weight gain, or early satiety. He has not had ICD shocks.   Device History: St. Jude Single Chamber ICD implanted 2016 for chronic systolic CHF History of appropriate therapy: No History of AAD therapy: Yes; currently on amiodarone   Past Medical History:  Diagnosis Date  . AICD (automatic cardioverter/defibrillator) present    reports no shocks  - st Jude  . Ambulates with cane   . CAD (coronary artery disease)    a. 04/2013 Cath: LM 10, LAD 40p, 23m, D1 50p, LCX 60p, 48m/100m, OM1 50, OM2 100 L-L collats, RCA 100p;  b. CABG x 5: LIMA->LAD, VG->D2, VG->RI->OM2, VG->PDA.  . Cataracts, bilateral   . Chronic systolic CHF (congestive heart failure) ([place])    a. 04/2013 Echo: EF 15-20%, diff HK, sev HK of inf and ant myocardium, dilated LA,  b. EF 20-25% with restrictive filling pattern, mod RV dysfx, mild MR (10/10/2013);  c.  Echo 12/15: EF 25-30%  . COPD (chronic obstructive pulmonary disease) ([place])   . Diabetes mellitus without complication ([place])    unsure what type but was dx as an adult, takes no meds, check his cbg at home 2 times a week   . Emphysema   . GERD (gastroesophageal reflux disease)   . Gout   . Hyperlipidemia   . Hypertension   . Ischemic cardiomyopathy   . lung  ca   . PVD (peripheral vascular disease) ([place])    a. (10/2013) ABIs: RIGHT 0.63, Waveforms: monophasic;  LEFT 0.78, Waveforms: monophasic  . Tobacco abuse    30+ pack-year history  . Transaminitis    a. 04/2013 Abd U/S and CT unremarkable, hepatitis panel neg -->felt to be 2/2 acute R heart failure.   Past Surgical History:  Procedure Laterality Date  . APPENDECTOMY  as a child  . BIOPSY  11/16/2019   Procedure: BIOPSY;  Surgeon: Rush Landmark Telford Nab., MD;  Location: [place];  Service: Gastroenterology;;  . CARDIAC CATHETERIZATION  04/08/2015   Procedure: Left Heart Cath and Cors/Grafts Angiography;  Surgeon: Peter M Martinique, MD;  Location: [place] CV LAB;  Service: Cardiovascular;;  . COLONOSCOPY    . COLONOSCOPY WITH PROPOFOL N/A 11/16/2019   Procedure: COLONOSCOPY WITH PROPOFOL;  Surgeon: Rush Landmark Telford Nab., MD;  Location: [place];  Service: Gastroenterology;  Laterality: N/A;  . CORONARY ARTERY BYPASS GRAFT N/A 05/05/2013   Procedure: CORONARY ARTERY BYPASS GRAFTING (CABG) TIMES FIVE USING LEFT INTERNAL MAMMARY ARTERY AND RIGHT AND LEFT SAPHENOUS LEG VEIN HARVESTED ENDOSCOPICALLY;  Surgeon: Melrose Nakayama, MD;  Location: [place];  Service: Open Heart Surgery;  Laterality: N/A;  . ESOPHAGOGASTRODUODENOSCOPY (EGD) WITH PROPOFOL N/A 11/16/2019   Procedure: ESOPHAGOGASTRODUODENOSCOPY (EGD) WITH PROPOFOL;  Surgeon: Rush Landmark Telford Nab., MD;  Location: [place];  Service: Gastroenterology;  Laterality: N/A;  . HEMOSTASIS CLIP PLACEMENT  11/16/2019   Procedure:  HEMOSTASIS CLIP PLACEMENT;  Surgeon: Mansouraty, Telford Nab., MD;  Location: [place];  Service: Gastroenterology;;  . IMPLANTABLE CARDIOVERTER DEFIBRILLATOR IMPLANT N/A 07/06/2014   Procedure: St Jude IMPLANTABLE CARDIOVERTER DEFIBRILLATOR IMPLANT;  Surgeon: Deboraha Sprang, MD;  Location: [facility] CATH LAB;  Service: Cardiovascular;  Laterality: N/A;  . LEFT AND RIGHT HEART CATHETERIZATION WITH CORONARY ANGIOGRAM  N/A 04/28/2013   Procedure: LEFT AND RIGHT HEART CATHETERIZATION WITH CORONARY ANGIOGRAM;  Surgeon: Burnell Blanks, MD;  Location: [facility] CATH LAB;  Service: Cardiovascular;  Laterality: N/A;  . MULTIPLE EXTRACTIONS WITH ALVEOLOPLASTY N/A 05/03/2013   Procedure: Extraction of tooth #s 3,4,5,6,8,9,27 with alveoloplast and maxillary left osseous tuberosity reduction;  Surgeon: Lenn Cal, DDS;  Location: [place];  Service: Oral Surgery;  Laterality: N/A;  . POLYPECTOMY  11/16/2019   Procedure: POLYPECTOMY;  Surgeon: Rush Landmark Telford Nab., MD;  Location: [place];  Service: Gastroenterology;;  . TEE WITHOUT CARDIOVERSION N/A 09/01/2019   Procedure: TRANSESOPHAGEAL ECHOCARDIOGRAM (TEE);  Surgeon: Larey Dresser, MD;  Location[facility] ENDOSCOPY;  Service: Cardiovascular;  Laterality: N/A;  . TOTAL HIP ARTHROPLASTY Right 11/30/2018   Procedure: TOTAL HIP ARTHROPLASTY ANTERIOR APPROACH;  Surgeon: Rod Can, MD;  Location: [facility];  Service: Orthopedics;  Laterality: Right;  Marland Kitchen VIDEO BRONCHOSCOPY WITH ENDOBRONCHIAL ULTRASOUND N/A 04/20/2019   Procedure: VIDEO BRONCHOSCOPY WITH ENDOBRONCHIAL ULTRASOUND;  Surgeon: Margaretha Seeds, MD;  Location: [place];  Service: Thoracic;  Laterality: N/A;    Current Outpatient Medications  Medication Sig Dispense Refill  . acetaminophen (TYLENOL) 325 MG tablet Take 650 mg by mouth every 6 (six) hours as needed for mild pain or moderate pain.     Marland Kitchen albuterol (ACCUNEB) 1.25 MG/3ML nebulizer solution Take 1 ampule by nebulization every 6 (six) hours as needed for wheezing.    Marland Kitchen albuterol (VENTOLIN HFA) 108 (90 Base) MCG/ACT inhaler Inhale 1-2 puffs into the lungs every 6 (six) hours as needed for wheezing or shortness of breath. 6.7 g 6  . allopurinol (ZYLOPRIM) 300 MG tablet Take 300 mg by mouth daily.    Marland Kitchen amiodarone (PACERONE) 400 MG tablet Take 0.5 tablets (200 mg total) by mouth daily. 30 tablet 5  . atorvastatin (LIPITOR) 20 MG tablet Take 20 mg by  mouth daily.    Marland Kitchen azelastine (OPTIVAR) 0.05 % ophthalmic solution Place 1 drop into the left eye 2 (two) times daily.   0  . b complex vitamins tablet Take 1 tablet by mouth at bedtime.     . B Complex-C-Folic Acid (RENA-VITE RX) 1 MG TABS Take 1 tablet by mouth daily.    . chlorproMAZINE (THORAZINE) 25 MG tablet Take 1 tablet (25 mg total) by mouth 3 (three) times daily as needed. 30 tablet 0  . Cholecalciferol (VITAMIN D3) 25 MCG (1000 UT) CAPS Take 1,000 Units by mouth daily at 12 noon.     Marland Kitchen ELIQUIS 5 MG TABS tablet Take 5 mg by mouth 2 (two) times daily.     . ferrous gluconate (FERGON) 324 MG tablet Take 1 tablet (324 mg total) by mouth daily with breakfast. 30 tablet 1  . furosemide (LASIX) 40 MG tablet Take 1 tablet (40 mg total) by mouth daily. 30 tablet 5  . guaiFENesin-dextromethorphan (ROBITUSSIN DM) 100-10 MG/5ML syrup Take 5 mLs by mouth every 4 (four) hours as needed for cough.    . hydrALAZINE (APRESOLINE) 25 MG tablet Take 0.5 tablets (12.5 mg total) by mouth 3 (three) times daily. 135 tablet 3  . Melatonin 3 MG  TABS Take 3 mg by mouth at bedtime.    . metoprolol succinate (TOPROL-XL) 50 MG 24 hr tablet Take 50 mg by mouth in the morning and at bedtime.     . nitroGLYCERIN (NITROSTAT) 0.4 MG SL tablet Place 0.4 mg under the tongue every 5 (five) minutes as needed for chest pain.     Marland Kitchen omeprazole (PRILOSEC) 40 MG capsule Take 40 mg by mouth daily.    . potassium chloride SA (KLOR-CON) 20 MEQ tablet Take 1 tablet (20 mEq total) by mouth daily. 2 tablet 0  . senna (SENOKOT) 8.6 MG TABS tablet Take 1 tablet (8.6 mg total) by mouth 2 (two) times daily. 120 each 0  . SPIRIVA HANDIHALER 18 MCG inhalation capsule Place 1 capsule into inhaler and inhale daily.    Marland Kitchen spironolactone (ALDACTONE) 25 MG tablet Take 25 mg by mouth daily.    . sucralfate (CARAFATE) 1 g tablet Take 1 tablet (1 g total) by mouth 4 (four) times daily -  with meals and at bedtime. 5 min before meals for radiation  induced esophagitis 120 tablet 2  . tiZANidine (ZANAFLEX) 4 MG capsule Take 4 mg by mouth 3 (three) times daily as needed for muscle spasms.      No current facility-administered medications for this visit.    Allergies:   Penicillins   Social History: Social History   Socioeconomic History  . Marital status: Single    Spouse name: Not on file  . Number of children: Not on file  . Years of education: Not on file  . Highest education level: Not on file  Occupational History  . Occupation: Retired  Tobacco Use  . Smoking status: Former Smoker    Packs/day: 2.00    Years: 30.00    Pack years: 60.00    Types: Cigarettes    Start date: 1969    Quit date: 03/23/2019    Years since quitting: 0.6  . Smokeless tobacco: Never Used  . Tobacco comment: Pt states now smokes - half pack per day 04/05/19  Substance and Sexual Activity  . Alcohol use: Not Currently  . Drug use: Yes    Types: Marijuana    Comment: last use 04/17/19  . Sexual activity: Not on file  Other Topics Concern  . Not on file  Social History Narrative   Lives in [place] with his girlfriend and her daughter.     Social Determinants of Health   Financial Resource Strain: Low Risk   . Difficulty of Paying Living Expenses: Not very hard  Food Insecurity: No Food Insecurity  . Worried About Charity fundraiser in the Last Year: Never true  . Ran Out of Food in the Last Year: Never true  Transportation Needs: No Transportation Needs  . Lack of Transportation (Medical): No  . Lack of Transportation (Non-Medical): No  Physical Activity:   . Days of Exercise per Week:   . Minutes of Exercise per Session:   Stress:   . Feeling of Stress :   Social Connections:   . Frequency of Communication with Friends and Family:   . Frequency of Social Gatherings with Friends and Family:   . Attends Religious Services:   . Active Member of Clubs or Organizations:   . Attends Archivist Meetings:   Marland Kitchen Marital  Status:   Intimate Partner Violence:   . Fear of Current or Ex-Partner:   . Emotionally Abused:   Marland Kitchen Physically Abused:   . Sexually  Abused:     Family History: Family History  Problem Relation Age of Onset  . Diabetes Mother   . Hypertension Father   . Hypertension Other   . Heart attack Other        Brothers  . Diabetes Brother   . Heart attack Brother   . Heart attack Brother   . Stroke Neg Hx     Review of Systems: All other systems reviewed and are otherwise negative except as noted above.   Physical Exam: Vitals:   11/21/19 0920  BP: 110/68  Pulse: 76  SpO2: 98%  Weight: 202 lb (91.6 kg)  Height: 5\' 7"  (1.702 m)     GEN- The patient is well appearing, alert and oriented x 3 today.   HEENT: normocephalic, atraumatic; sclera clear, conjunctiva pink; hearing intact; oropharynx clear; neck supple, no JVP Lymph- no cervical lymphadenopathy Lungs- Clear to ausculation bilaterally, normal work of breathing.  No wheezes, rales, rhonchi Heart- Regular rate and rhythm, no murmurs, rubs or gallops, PMI not laterally displaced GI- soft, non-tender, non-distended, bowel sounds present, no hepatosplenomegaly Extremities- no clubbing, cyanosis, or edema; DP/PT/radial pulses 2+ bilaterally MS- no significant deformity or atrophy Skin- warm and dry, no rash or lesion; ICD pocket well healed Psych- euthymic mood, full affect Neuro- strength and sensation are intact  ICD interrogation- reviewed in detail today,  See PACEART report  EKG:  EKG is not ordered today.  Recent Labs: 07/26/2019: Magnesium 1.2; TSH 0.761 11/18/2019: ALT 28; BUN 24; Creatinine, Ser 1.72; Hemoglobin 8.5; Platelets 195; Potassium 3.9; Sodium 137   Wt Readings from Last 3 Encounters:  11/21/19 202 lb (91.6 kg)  11/17/19 199 lb 11.8 oz (90.6 kg)  10/30/19 201 lb 6.4 oz (91.4 kg)     Other studies Reviewed: Additional studies/ records that were reviewed today include: Recent admission notes. Previous  EP office notes.    Assessment and Plan:  1.  Chronic systolic dysfunction s/p St. Jude dual chamber ICD  euvolemic today Stable on an appropriate medical regimen Normal ICD function See Pace Art report No changes today His QRS is too wide for barostim consideration.   2. Presumed AF with RVR Continue amiodarone 200 mg daily.  BMET stable on discharge. Would recheck next week at HF clinic pharmacy visit Continue eliquis for CHA2DS2VASC of at least 5    3. AKI on CKD III-IV Labs with improved off Entresto on discharge. He has HF pharmD visit next week.  4. HTN Continuec current medicaitons.   5. Lung Cancer, NSCLC Completed radiation; "watching" for now per patient.   Current medicines are reviewed at length with the patient today.   The patient does not have concerns regarding his medicines.  The following changes were made today:  none  Labs/ tests ordered today include:  Orders Placed This Encounter  Procedures  . CUP PACEART INCLINIC DEVICE CHECK     Disposition:   Follow up with EP in 6 months. He has HF pharmacy visit next week, and visit with Dr. Aundra Dubin in August.   Signed, Shirley Friar, PA-C  11/21/2019 9:56 AM  [place] [address] [phone] (office) [phone] (fax)

## 2019-11-22 DIAGNOSIS — I10 Essential (primary) hypertension: Secondary | ICD-10-CM | POA: Diagnosis not present

## 2019-11-22 DIAGNOSIS — D500 Iron deficiency anemia secondary to blood loss (chronic): Secondary | ICD-10-CM | POA: Diagnosis not present

## 2019-11-22 DIAGNOSIS — I509 Heart failure, unspecified: Secondary | ICD-10-CM | POA: Diagnosis not present

## 2019-11-22 DIAGNOSIS — Z6831 Body mass index (BMI) 31.0-31.9, adult: Secondary | ICD-10-CM | POA: Diagnosis not present

## 2019-11-22 DIAGNOSIS — J449 Chronic obstructive pulmonary disease, unspecified: Secondary | ICD-10-CM | POA: Diagnosis not present

## 2019-11-24 ENCOUNTER — Encounter: Payer: Self-pay | Admitting: Gastroenterology

## 2019-11-24 DIAGNOSIS — E785 Hyperlipidemia, unspecified: Secondary | ICD-10-CM | POA: Diagnosis not present

## 2019-11-24 DIAGNOSIS — M109 Gout, unspecified: Secondary | ICD-10-CM | POA: Diagnosis not present

## 2019-11-24 DIAGNOSIS — E669 Obesity, unspecified: Secondary | ICD-10-CM | POA: Diagnosis not present

## 2019-11-24 DIAGNOSIS — N1831 Chronic kidney disease, stage 3a: Secondary | ICD-10-CM | POA: Diagnosis not present

## 2019-11-24 DIAGNOSIS — M1990 Unspecified osteoarthritis, unspecified site: Secondary | ICD-10-CM | POA: Diagnosis not present

## 2019-11-24 DIAGNOSIS — I5043 Acute on chronic combined systolic (congestive) and diastolic (congestive) heart failure: Secondary | ICD-10-CM | POA: Diagnosis not present

## 2019-11-24 DIAGNOSIS — I130 Hypertensive heart and chronic kidney disease with heart failure and stage 1 through stage 4 chronic kidney disease, or unspecified chronic kidney disease: Secondary | ICD-10-CM | POA: Diagnosis not present

## 2019-11-24 DIAGNOSIS — Z87891 Personal history of nicotine dependence: Secondary | ICD-10-CM | POA: Diagnosis not present

## 2019-11-24 DIAGNOSIS — E1122 Type 2 diabetes mellitus with diabetic chronic kidney disease: Secondary | ICD-10-CM | POA: Diagnosis not present

## 2019-11-27 ENCOUNTER — Ambulatory Visit (HOSPITAL_COMMUNITY)
Admission: RE | Admit: 2019-11-27 | Discharge: 2019-11-27 | Disposition: A | Discharge status: Home / Self Care | Payer: Medicare HMO | Source: Ambulatory Visit | Attending: Internal Medicine | Admitting: Internal Medicine

## 2019-11-27 ENCOUNTER — Other Ambulatory Visit: Payer: Self-pay

## 2019-11-27 VITALS — BP 118/68 | HR 72 | Wt 200.4 lb

## 2019-11-27 DIAGNOSIS — Z951 Presence of aortocoronary bypass graft: Secondary | ICD-10-CM | POA: Insufficient documentation

## 2019-11-27 DIAGNOSIS — I480 Paroxysmal atrial fibrillation: Secondary | ICD-10-CM | POA: Diagnosis not present

## 2019-11-27 DIAGNOSIS — Z923 Personal history of irradiation: Secondary | ICD-10-CM | POA: Insufficient documentation

## 2019-11-27 DIAGNOSIS — Z7901 Long term (current) use of anticoagulants: Secondary | ICD-10-CM | POA: Insufficient documentation

## 2019-11-27 DIAGNOSIS — I5022 Chronic systolic (congestive) heart failure: Secondary | ICD-10-CM | POA: Diagnosis not present

## 2019-11-27 DIAGNOSIS — Z95810 Presence of automatic (implantable) cardiac defibrillator: Secondary | ICD-10-CM | POA: Insufficient documentation

## 2019-11-27 DIAGNOSIS — E1151 Type 2 diabetes mellitus with diabetic peripheral angiopathy without gangrene: Secondary | ICD-10-CM | POA: Diagnosis not present

## 2019-11-27 DIAGNOSIS — Z87891 Personal history of nicotine dependence: Secondary | ICD-10-CM | POA: Diagnosis not present

## 2019-11-27 DIAGNOSIS — I2510 Atherosclerotic heart disease of native coronary artery without angina pectoris: Secondary | ICD-10-CM | POA: Diagnosis not present

## 2019-11-27 DIAGNOSIS — I130 Hypertensive heart and chronic kidney disease with heart failure and stage 1 through stage 4 chronic kidney disease, or unspecified chronic kidney disease: Secondary | ICD-10-CM | POA: Insufficient documentation

## 2019-11-27 DIAGNOSIS — E1122 Type 2 diabetes mellitus with diabetic chronic kidney disease: Secondary | ICD-10-CM | POA: Insufficient documentation

## 2019-11-27 DIAGNOSIS — C3490 Malignant neoplasm of unspecified part of unspecified bronchus or lung: Secondary | ICD-10-CM | POA: Diagnosis not present

## 2019-11-27 DIAGNOSIS — J449 Chronic obstructive pulmonary disease, unspecified: Secondary | ICD-10-CM | POA: Diagnosis not present

## 2019-11-27 DIAGNOSIS — Z79899 Other long term (current) drug therapy: Secondary | ICD-10-CM | POA: Insufficient documentation

## 2019-11-27 DIAGNOSIS — N183 Chronic kidney disease, stage 3 unspecified: Secondary | ICD-10-CM | POA: Diagnosis not present

## 2019-11-27 MED ORDER — FUROSEMIDE 40 MG PO TABS: 20.0000 mg | ORAL_TABLET | Freq: Every day | ORAL | 6 refills | Status: DC

## 2019-11-27 MED ORDER — SACUBITRIL-VALSARTAN 24-26 MG PO TABS: 1.0000 | ORAL_TABLET | Freq: Two times a day (BID) | ORAL | 11 refills | Status: DC

## 2019-11-27 NOTE — Patient Instructions (Addendum)
It was a pleasure seeing you today!  MEDICATIONS: -We are changing your medications today -Restart Entresto 24/26 mg (1 tablet) twice daily -Decrease furosemide to 20 mg (1/2 tablet) daily -Call if you have questions about your medications.   NEXT APPOINTMENT: Return to clinic in 4 weeks with Pharmacy Clinic.  In general, to take care of your heart failure: -Limit your fluid intake to 2 Liters (half-gallon) per day.   -Limit your salt intake to ideally 2-3 grams (2000-3000 mg) per day. -Weigh yourself daily and record, and bring that "weight diary" to your next appointment.  (Weight gain of 2-3 pounds in 1 day typically means fluid weight.) -The medications for your heart are to help your heart and help you live longer.   -Please contact us before stopping any of your heart medications.  Call the clinic at [phone] with questions or to reschedule future appointments.

## 2019-12-01 DIAGNOSIS — M1990 Unspecified osteoarthritis, unspecified site: Secondary | ICD-10-CM | POA: Diagnosis not present

## 2019-12-01 DIAGNOSIS — Z87891 Personal history of nicotine dependence: Secondary | ICD-10-CM | POA: Diagnosis not present

## 2019-12-01 DIAGNOSIS — I5043 Acute on chronic combined systolic (congestive) and diastolic (congestive) heart failure: Secondary | ICD-10-CM | POA: Diagnosis not present

## 2019-12-01 DIAGNOSIS — N1831 Chronic kidney disease, stage 3a: Secondary | ICD-10-CM | POA: Diagnosis not present

## 2019-12-01 DIAGNOSIS — E785 Hyperlipidemia, unspecified: Secondary | ICD-10-CM | POA: Diagnosis not present

## 2019-12-01 DIAGNOSIS — E669 Obesity, unspecified: Secondary | ICD-10-CM | POA: Diagnosis not present

## 2019-12-01 DIAGNOSIS — I130 Hypertensive heart and chronic kidney disease with heart failure and stage 1 through stage 4 chronic kidney disease, or unspecified chronic kidney disease: Secondary | ICD-10-CM | POA: Diagnosis not present

## 2019-12-01 DIAGNOSIS — M109 Gout, unspecified: Secondary | ICD-10-CM | POA: Diagnosis not present

## 2019-12-01 DIAGNOSIS — E1122 Type 2 diabetes mellitus with diabetic chronic kidney disease: Secondary | ICD-10-CM | POA: Diagnosis not present

## 2019-12-05 DIAGNOSIS — E1122 Type 2 diabetes mellitus with diabetic chronic kidney disease: Secondary | ICD-10-CM | POA: Diagnosis not present

## 2019-12-05 DIAGNOSIS — Z87891 Personal history of nicotine dependence: Secondary | ICD-10-CM | POA: Diagnosis not present

## 2019-12-05 DIAGNOSIS — E785 Hyperlipidemia, unspecified: Secondary | ICD-10-CM | POA: Diagnosis not present

## 2019-12-05 DIAGNOSIS — E669 Obesity, unspecified: Secondary | ICD-10-CM | POA: Diagnosis not present

## 2019-12-05 DIAGNOSIS — N1831 Chronic kidney disease, stage 3a: Secondary | ICD-10-CM | POA: Diagnosis not present

## 2019-12-05 DIAGNOSIS — I5043 Acute on chronic combined systolic (congestive) and diastolic (congestive) heart failure: Secondary | ICD-10-CM | POA: Diagnosis not present

## 2019-12-05 DIAGNOSIS — M109 Gout, unspecified: Secondary | ICD-10-CM | POA: Diagnosis not present

## 2019-12-05 DIAGNOSIS — I130 Hypertensive heart and chronic kidney disease with heart failure and stage 1 through stage 4 chronic kidney disease, or unspecified chronic kidney disease: Secondary | ICD-10-CM | POA: Diagnosis not present

## 2019-12-05 DIAGNOSIS — M1990 Unspecified osteoarthritis, unspecified site: Secondary | ICD-10-CM | POA: Diagnosis not present

## 2019-12-11 ENCOUNTER — Inpatient Hospital Stay: Payer: Medicare HMO | Attending: Internal Medicine

## 2019-12-11 ENCOUNTER — Inpatient Hospital Stay: Payer: Medicare HMO

## 2019-12-11 ENCOUNTER — Other Ambulatory Visit: Payer: Self-pay

## 2019-12-11 ENCOUNTER — Ambulatory Visit (HOSPITAL_COMMUNITY)
Admission: RE | Admit: 2019-12-11 | Discharge: 2019-12-11 | Disposition: A | Discharge status: Home / Self Care | Payer: Medicare HMO | Source: Ambulatory Visit | Attending: Internal Medicine | Admitting: Internal Medicine

## 2019-12-11 DIAGNOSIS — R5383 Other fatigue: Secondary | ICD-10-CM | POA: Diagnosis not present

## 2019-12-11 DIAGNOSIS — I5022 Chronic systolic (congestive) heart failure: Secondary | ICD-10-CM | POA: Insufficient documentation

## 2019-12-11 DIAGNOSIS — E1151 Type 2 diabetes mellitus with diabetic peripheral angiopathy without gangrene: Secondary | ICD-10-CM | POA: Diagnosis not present

## 2019-12-11 DIAGNOSIS — I255 Ischemic cardiomyopathy: Secondary | ICD-10-CM | POA: Insufficient documentation

## 2019-12-11 DIAGNOSIS — C3490 Malignant neoplasm of unspecified part of unspecified bronchus or lung: Secondary | ICD-10-CM | POA: Diagnosis not present

## 2019-12-11 DIAGNOSIS — E1136 Type 2 diabetes mellitus with diabetic cataract: Secondary | ICD-10-CM | POA: Insufficient documentation

## 2019-12-11 DIAGNOSIS — C3412 Malignant neoplasm of upper lobe, left bronchus or lung: Secondary | ICD-10-CM | POA: Insufficient documentation

## 2019-12-11 DIAGNOSIS — J449 Chronic obstructive pulmonary disease, unspecified: Secondary | ICD-10-CM | POA: Insufficient documentation

## 2019-12-11 DIAGNOSIS — E785 Hyperlipidemia, unspecified: Secondary | ICD-10-CM | POA: Diagnosis not present

## 2019-12-11 DIAGNOSIS — R1310 Dysphagia, unspecified: Secondary | ICD-10-CM | POA: Insufficient documentation

## 2019-12-11 DIAGNOSIS — Z79899 Other long term (current) drug therapy: Secondary | ICD-10-CM | POA: Diagnosis not present

## 2019-12-11 DIAGNOSIS — I5082 Biventricular heart failure: Secondary | ICD-10-CM | POA: Insufficient documentation

## 2019-12-11 DIAGNOSIS — K219 Gastro-esophageal reflux disease without esophagitis: Secondary | ICD-10-CM | POA: Diagnosis not present

## 2019-12-11 DIAGNOSIS — I2510 Atherosclerotic heart disease of native coronary artery without angina pectoris: Secondary | ICD-10-CM | POA: Diagnosis not present

## 2019-12-11 DIAGNOSIS — Z95810 Presence of automatic (implantable) cardiac defibrillator: Secondary | ICD-10-CM | POA: Diagnosis not present

## 2019-12-11 DIAGNOSIS — I110 Hypertensive heart disease with heart failure: Secondary | ICD-10-CM | POA: Diagnosis not present

## 2019-12-11 DIAGNOSIS — Z7901 Long term (current) use of anticoagulants: Secondary | ICD-10-CM | POA: Diagnosis not present

## 2019-12-11 LAB — CBC WITH DIFFERENTIAL (CANCER CENTER ONLY)
Abs Immature Granulocytes: 0.01 10*3/uL (ref 0.00–0.07)
Basophils Absolute: 0 10*3/uL (ref 0.0–0.1)
Basophils Relative: 1 %
Eosinophils Absolute: 0.1 10*3/uL (ref 0.0–0.5)
Eosinophils Relative: 2 %
HCT: 31 % — ABNORMAL LOW (ref 39.0–52.0)
Hemoglobin: 9.1 g/dL — ABNORMAL LOW (ref 13.0–17.0)
Immature Granulocytes: 0 %
Lymphocytes Relative: 10 %
Lymphs Abs: 0.6 10*3/uL — ABNORMAL LOW (ref 0.7–4.0)
MCH: 26.6 pg (ref 26.0–34.0)
MCHC: 29.4 g/dL — ABNORMAL LOW (ref 30.0–36.0)
MCV: 90.6 fL (ref 80.0–100.0)
Monocytes Absolute: 0.5 10*3/uL (ref 0.1–1.0)
Monocytes Relative: 9 %
Neutro Abs: 4.7 10*3/uL (ref 1.7–7.7)
Neutrophils Relative %: 78 %
Platelet Count: 224 10*3/uL (ref 150–400)
RBC: 3.42 MIL/uL — ABNORMAL LOW (ref 4.22–5.81)
RDW: 22.8 % — ABNORMAL HIGH (ref 11.5–15.5)
WBC Count: 6 10*3/uL (ref 4.0–10.5)
nRBC: 0 % (ref 0.0–0.2)

## 2019-12-11 LAB — CMP (CANCER CENTER ONLY)
ALT: 28 U/L (ref 0–44)
AST: 19 U/L (ref 15–41)
Albumin: 3.2 g/dL — ABNORMAL LOW (ref 3.5–5.0)
Alkaline Phosphatase: 74 U/L (ref 38–126)
Anion gap: 9 (ref 5–15)
BUN: 22 mg/dL (ref 8–23)
CO2: 23 mmol/L (ref 22–32)
Calcium: 9.4 mg/dL (ref 8.9–10.3)
Chloride: 110 mmol/L (ref 98–111)
Creatinine: 1.81 mg/dL — ABNORMAL HIGH (ref 0.61–1.24)
GFR, Est AFR Am: 44 mL/min — ABNORMAL LOW (ref >60)
GFR, Estimated: 38 mL/min — ABNORMAL LOW (ref >60)
Glucose, Bld: 98 mg/dL (ref 70–99)
Potassium: 4.1 mmol/L (ref 3.5–5.1)
Sodium: 142 mmol/L (ref 135–145)
Total Bilirubin: 0.5 mg/dL (ref 0.3–1.2)
Total Protein: 6.6 g/dL (ref 6.5–8.1)

## 2019-12-12 ENCOUNTER — Inpatient Hospital Stay (HOSPITAL_BASED_OUTPATIENT_CLINIC_OR_DEPARTMENT_OTHER): Payer: Medicare HMO | Admitting: Internal Medicine

## 2019-12-12 ENCOUNTER — Other Ambulatory Visit: Payer: Self-pay

## 2019-12-12 ENCOUNTER — Encounter: Payer: Self-pay | Admitting: Internal Medicine

## 2019-12-12 VITALS — BP 106/57 | HR 74 | Temp 97.7°F | Resp 18 | Ht 67.0 in | Wt 207.0 lb

## 2019-12-12 DIAGNOSIS — I2510 Atherosclerotic heart disease of native coronary artery without angina pectoris: Secondary | ICD-10-CM | POA: Diagnosis not present

## 2019-12-12 DIAGNOSIS — R1319 Other dysphagia: Secondary | ICD-10-CM

## 2019-12-12 DIAGNOSIS — R350 Frequency of micturition: Secondary | ICD-10-CM

## 2019-12-12 DIAGNOSIS — C3412 Malignant neoplasm of upper lobe, left bronchus or lung: Secondary | ICD-10-CM | POA: Diagnosis not present

## 2019-12-12 DIAGNOSIS — I5022 Chronic systolic (congestive) heart failure: Secondary | ICD-10-CM | POA: Diagnosis not present

## 2019-12-12 DIAGNOSIS — D701 Agranulocytosis secondary to cancer chemotherapy: Secondary | ICD-10-CM | POA: Diagnosis not present

## 2019-12-12 DIAGNOSIS — J449 Chronic obstructive pulmonary disease, unspecified: Secondary | ICD-10-CM | POA: Diagnosis not present

## 2019-12-12 DIAGNOSIS — C3492 Malignant neoplasm of unspecified part of left bronchus or lung: Secondary | ICD-10-CM

## 2019-12-12 DIAGNOSIS — R1310 Dysphagia, unspecified: Secondary | ICD-10-CM | POA: Diagnosis not present

## 2019-12-12 DIAGNOSIS — I9589 Other hypotension: Secondary | ICD-10-CM

## 2019-12-12 DIAGNOSIS — C3490 Malignant neoplasm of unspecified part of unspecified bronchus or lung: Secondary | ICD-10-CM

## 2019-12-12 DIAGNOSIS — I255 Ischemic cardiomyopathy: Secondary | ICD-10-CM | POA: Diagnosis not present

## 2019-12-12 DIAGNOSIS — R000 Tachycardia, unspecified: Secondary | ICD-10-CM | POA: Diagnosis not present

## 2019-12-12 DIAGNOSIS — R5383 Other fatigue: Secondary | ICD-10-CM | POA: Diagnosis not present

## 2019-12-12 DIAGNOSIS — I110 Hypertensive heart disease with heart failure: Secondary | ICD-10-CM | POA: Diagnosis not present

## 2019-12-12 DIAGNOSIS — I5082 Biventricular heart failure: Secondary | ICD-10-CM | POA: Diagnosis not present

## 2019-12-12 NOTE — Progress Notes (Signed)
[place] [phone]   [phone]  OFFICE PROGRESS NOTE  [name], PA-C [place] [address]  DIAGNOSIS: stage IIIc (T2 a, N3, M0) non-small cell lung cancer, squamous cell carcinoma diagnosed in October 2020 and presented with left upper lobe pleural-based mass in addition to left hilar and right paratracheal lymphadenopathy.  PRIOR THERAPY:Concurrent chemoradiation with weekly carboplatin for AUC of 2 and paclitaxel 45 mg/M2.  First dose was on May 08, 2019.  Status post 7 cycles.  He declined consolidation immunotherapy.  . CURRENT THERAPY: Observation.  INTERVAL HISTORY: [name] [name]. 69 y.o. male returns to the clinic today for follow-up visit.  The patient is feeling fine today with no concerning complaints.  The patient denied having any current chest pain but has shortness of breath with exertion with no cough or hemoptysis.  He denied having any fever or chills.  He has no nausea, vomiting, diarrhea or constipation.  He has no headache or visual changes.  The patient had repeat CT scan of the chest performed recently and he is here for evaluation and discussion of his scan results.   MEDICAL HISTORY: Past Medical History:  Diagnosis Date  . AICD (automatic cardioverter/defibrillator) present    reports no shocks  - st Jude  . Ambulates with cane   . CAD (coronary artery disease)    a. 04/2013 Cath: LM 10, LAD 40p, 4m, D1 50p, LCX 60p, 42m/100m, OM1 50, OM2 100 L-L collats, RCA 100p;  b. CABG x 5: LIMA->LAD, VG->D2, VG->RI->OM2, VG->PDA.  . Cataracts, bilateral   . Chronic systolic CHF (congestive heart failure) ([place])    a. 04/2013 Echo: EF 15-20%, diff HK, sev HK of inf and ant myocardium, dilated LA,  b. EF 20-25% with restrictive filling pattern, mod RV dysfx, mild MR (10/10/2013);  c.  Echo 12/15: EF 25-30%  . COPD (chronic obstructive pulmonary disease) ([place])   . Diabetes mellitus without complication  ([facility] [place])    unsure what type but was dx as an adult, takes no meds, check his cbg at home 2 times a week   . Emphysema   . GERD (gastroesophageal reflux disease)   . Gout   . Hyperlipidemia   . Hypertension   . Ischemic cardiomyopathy   . lung ca   . PVD (peripheral vascular disease) ([facility])    a. (10/2013) ABIs: RIGHT 0.63, Waveforms: monophasic;  LEFT 0.78, Waveforms: monophasic  . Tobacco abuse    30+ pack-year history  . Transaminitis    a. 04/2013 Abd U/S and CT unremarkable, hepatitis panel neg -->felt to be 2/2 acute R heart failure.    ALLERGIES:  is allergic to penicillins.  MEDICATIONS:  Current Outpatient Medications  Medication Sig Dispense Refill  . acetaminophen (TYLENOL) 325 MG tablet Take 650 mg by mouth every 6 (six) hours as needed for mild pain or moderate pain.     Marland Kitchen albuterol (ACCUNEB) 1.25 MG/3ML nebulizer solution Take 1 ampule by nebulization every 6 (six) hours as needed for wheezing.    Marland Kitchen albuterol (VENTOLIN HFA) 108 (90 Base) MCG/ACT inhaler Inhale 1-2 puffs into the lungs every 6 (six) hours as needed for wheezing or shortness of breath. 6.7 g 6  . allopurinol (ZYLOPRIM) 300 MG tablet Take 300 mg by mouth daily.    Marland Kitchen amiodarone (PACERONE) 400 MG tablet Take 0.5 tablets (200 mg total) by mouth daily. 30 tablet 5  . atorvastatin (LIPITOR) 20 MG tablet Take  20 mg by mouth daily.    Marland Kitchen azelastine (OPTIVAR) 0.05 % ophthalmic solution Place 1 drop into the left eye 2 (two) times daily.   0  . b complex vitamins tablet Take 1 tablet by mouth at bedtime.     . B Complex-C-Folic Acid (RENA-VITE RX) 1 MG TABS Take 1 tablet by mouth daily.    . chlorproMAZINE (THORAZINE) 25 MG tablet Take 1 tablet (25 mg total) by mouth 3 (three) times daily as needed. 30 tablet 0  . Cholecalciferol (VITAMIN D3) 25 MCG (1000 UT) CAPS Take 1,000 Units by mouth daily at 12 noon.     Marland Kitchen ELIQUIS 5 MG TABS tablet Take 5 mg by mouth 2 (two) times daily.     . ferrous gluconate (FERGON) 324 MG  tablet Take 1 tablet (324 mg total) by mouth daily with breakfast. 30 tablet 1  . furosemide (LASIX) 40 MG tablet Take 0.5 tablets (20 mg total) by mouth daily. 15 tablet 6  . guaiFENesin-dextromethorphan (ROBITUSSIN DM) 100-10 MG/5ML syrup Take 5 mLs by mouth every 4 (four) hours as needed for cough.    . hydrALAZINE (APRESOLINE) 25 MG tablet Take 0.5 tablets (12.5 mg total) by mouth 3 (three) times daily. 135 tablet 3  . Melatonin 3 MG TABS Take 3 mg by mouth at bedtime.    . metoprolol succinate (TOPROL-XL) 50 MG 24 hr tablet Take 50 mg by mouth in the morning and at bedtime.     . nitroGLYCERIN (NITROSTAT) 0.4 MG SL tablet Place 0.4 mg under the tongue every 5 (five) minutes as needed for chest pain.     Marland Kitchen omeprazole (PRILOSEC) 40 MG capsule Take 40 mg by mouth daily.    . potassium chloride SA (KLOR-CON) 20 MEQ tablet Take 1 tablet (20 mEq total) by mouth daily. 2 tablet 0  . sacubitril-valsartan (ENTRESTO) 24-26 MG Take 1 tablet by mouth 2 (two) times daily. 60 tablet 11  . senna (SENOKOT) 8.6 MG TABS tablet Take 1 tablet (8.6 mg total) by mouth 2 (two) times daily. 120 each 0  . SPIRIVA HANDIHALER 18 MCG inhalation capsule Place 1 capsule into inhaler and inhale daily.    Marland Kitchen spironolactone (ALDACTONE) 25 MG tablet Take 25 mg by mouth daily.    . sucralfate (CARAFATE) 1 g tablet Take 1 tablet (1 g total) by mouth 4 (four) times daily -  with meals and at bedtime. 5 min before meals for radiation induced esophagitis 120 tablet 2  . tiZANidine (ZANAFLEX) 4 MG capsule Take 4 mg by mouth 3 (three) times daily as needed for muscle spasms.      No current facility-administered medications for this visit.    SURGICAL HISTORY:  Past Surgical History:  Procedure Laterality Date  . APPENDECTOMY  as a child  . BIOPSY  11/16/2019   Procedure: BIOPSY;  Surgeon: Rush Landmark Telford Nab., MD;  Location: [facility];  Service: Gastroenterology;;  . CARDIAC CATHETERIZATION  04/08/2015   Procedure: Left  Heart Cath and Cors/Grafts Angiography;  Surgeon: Peter M Martinique, MD;  Location: [facility];  Service: Cardiovascular;;  . COLONOSCOPY    . COLONOSCOPY WITH PROPOFOL N/A 11/16/2019   Procedure: COLONOSCOPY WITH PROPOFOL;  Surgeon: Rush Landmark Telford Nab., MD;  Location: [facility];  Service: Gastroenterology;  Laterality: N/A;  . CORONARY ARTERY BYPASS GRAFT N/A 05/05/2013   Procedure: CORONARY ARTERY BYPASS GRAFTING (CABG) TIMES FIVE USING LEFT INTERNAL MAMMARY ARTERY AND RIGHT AND LEFT SAPHENOUS LEG VEIN HARVESTED ENDOSCOPICALLY;  Surgeon: Melrose Nakayama, MD;  Location: [facility];  Service: Open Heart Surgery;  Laterality: N/A;  . ESOPHAGOGASTRODUODENOSCOPY (EGD) WITH PROPOFOL N/A 11/16/2019   Procedure: ESOPHAGOGASTRODUODENOSCOPY (EGD) WITH PROPOFOL;  Surgeon: Rush Landmark Telford Nab., MD;  Location: [facility];  Service: Gastroenterology;  Laterality: N/A;  . HEMOSTASIS CLIP PLACEMENT  11/16/2019   Procedure: HEMOSTASIS CLIP PLACEMENT;  Surgeon: Irving Copas., MD;  Location: [facility];  Service: Gastroenterology;;  . IMPLANTABLE CARDIOVERTER DEFIBRILLATOR IMPLANT N/A 07/06/2014   Procedure: St Jude IMPLANTABLE CARDIOVERTER DEFIBRILLATOR IMPLANT;  Surgeon: Deboraha Sprang, MD;  Location[facility] CATH LAB;  Service: Cardiovascular;  Laterality: N/A;  . LEFT AND RIGHT HEART CATHETERIZATION WITH CORONARY ANGIOGRAM N/A 04/28/2013   Procedure: LEFT AND RIGHT HEART CATHETERIZATION WITH CORONARY ANGIOGRAM;  Surgeon: Burnell Blanks, MD;  Location: [facility] CATH LAB;  Service: Cardiovascular;  Laterality: N/A;  . MULTIPLE EXTRACTIONS WITH ALVEOLOPLASTY N/A 05/03/2013   Procedure: Extraction of tooth #s 3,4,5,6,8,9,27 with alveoloplast and maxillary left osseous tuberosity reduction;  Surgeon: Lenn Cal, DDS;  Location: [facility];  Service: Oral Surgery;  Laterality: N/A;  . POLYPECTOMY  11/16/2019   Procedure: POLYPECTOMY;  Surgeon: Rush Landmark Telford Nab., MD;  Location: [facility];   Service: Gastroenterology;;  . TEE WITHOUT CARDIOVERSION N/A 09/01/2019   Procedure: TRANSESOPHAGEAL ECHOCARDIOGRAM (TEE);  Surgeon: Larey Dresser, MD;  Location: [facility] ENDOSCOPY;  Service: Cardiovascular;  Laterality: N/A;  . TOTAL HIP ARTHROPLASTY Right 11/30/2018   Procedure: TOTAL HIP ARTHROPLASTY ANTERIOR APPROACH;  Surgeon: Rod Can, MD;  Location: [facility];  Service: Orthopedics;  Laterality: Right;  Marland Kitchen VIDEO BRONCHOSCOPY WITH ENDOBRONCHIAL ULTRASOUND N/A 04/20/2019   Procedure: VIDEO BRONCHOSCOPY WITH ENDOBRONCHIAL ULTRASOUND;  Surgeon: Margaretha Seeds, MD;  Location: [facility];  Service: Thoracic;  Laterality: N/A;    REVIEW OF SYSTEMS:  A comprehensive review of systems was negative except for: Constitutional: positive for fatigue   PHYSICAL EXAMINATION: General appearance: alert, cooperative, fatigued and no distress Head: Normocephalic, without obvious abnormality, atraumatic Neck: no adenopathy, no JVD, supple, symmetrical, trachea midline and thyroid not enlarged, symmetric, no tenderness/mass/nodules Lymph nodes: Cervical, supraclavicular, and axillary nodes normal. Resp: clear to auscultation bilaterally Back: symmetric, no curvature. ROM normal. No CVA tenderness. Cardio: regular rate and rhythm, S1, S2 normal, no murmur, click, rub or gallop GI: soft, non-tender; bowel sounds normal; no masses,  no organomegaly Extremities: extremities normal, atraumatic, no cyanosis or edema  ECOG PERFORMANCE STATUS: 1 - Symptomatic but completely ambulatory   Blood pressure (!) 106/57, pulse 74, temperature 97.7 F (36.5 C), temperature source Temporal, resp. rate 18, height 5\' 7"  (1.702 m), weight 207 lb (93.9 kg), SpO2 100 %.  LABORATORY DATA: Lab Results  Component Value Date   WBC 6.0 12/11/2019   HGB 9.1 (L) 12/11/2019   HCT 31.0 (L) 12/11/2019   MCV 90.6 12/11/2019   PLT 224 12/11/2019      Chemistry      Component Value Date/Time   NA 142 12/11/2019 1343   NA 140  08/23/2019 1020   K 4.1 12/11/2019 1343   CL 110 12/11/2019 1343   CO2 23 12/11/2019 1343   BUN 22 12/11/2019 1343   BUN 52 (H) 08/23/2019 1020   CREATININE 1.81 (H) 12/11/2019 1343   CREATININE 1.35 (H) 12/25/2015 1310      Component Value Date/Time   CALCIUM 9.4 12/11/2019 1343   ALKPHOS 74 12/11/2019 1343   AST 19 12/11/2019 1343   ALT 28 12/11/2019 1343   BILITOT 0.5 12/11/2019  1343       RADIOGRAPHIC STUDIES: CT Chest Wo Contrast  Result Date: 12/11/2019 CLINICAL DATA:  Non small cell lung cancer. EXAM: CT CHEST WITHOUT CONTRAST TECHNIQUE: Multidetector CT imaging of the chest was performed following the standard protocol without IV contrast. COMPARISON:  09/08/2019. FINDINGS: Cardiovascular: Atherosclerotic calcification of the aorta and aortic valve. Heart is mildly enlarged. No pericardial effusion. Mediastinum/Nodes: 7 mm low-attenuation lesion in the left thyroid is similar. No follow-up recommended (ref: J Am Coll Radiol. 2015 Feb;12(2): 143-50).No pathologically enlarged mediastinal or axillary lymph nodes. Hilar regions are difficult to evaluate without IV contrast. Esophagus is grossly unremarkable. Lungs/Pleura: Centrilobular emphysema. Evolving bronchiectasis, volume loss and pulmonary parenchymal distortion in the medial right upper lobe and left upper and left lower lobes. Thin rind of loculated left pleural fluid. Lungs are otherwise clear. Airway is unremarkable. Upper Abdomen: Visualized portions of the liver, gallbladder and adrenal glands are unremarkable. There may be a subcentimeter low-attenuation lesion in the interpolar left kidney, too small to characterize. Visualized portions of the spleen, pancreas, stomach and bowel are grossly unremarkable. Musculoskeletal: Degenerative changes in the spine. No worrisome lytic or sclerotic lesions. IMPRESSION: 1. Evolving changes of radiation therapy bilaterally, left greater than right, without evidence of recurrent or  metastatic disease. 2.  Aortic atherosclerosis (ICD10-I70.0). 3.  Emphysema (ICD10-J43.9). Electronically Signed   By: Lorin Picket M.D.   On: 12/11/2019 16:31   DG Chest Portable 1 View  Result Date: 11/14/2019 CLINICAL DATA:  Anemia EXAM: PORTABLE CHEST 1 VIEW COMPARISON:  September 08, 2019 chest CT; chest radiograph July 01, 2019. FINDINGS: There is extensive fibrosis in the left perihilar region, and to a lesser extent in the right perihilar region. There is no frank edema or consolidation. Note that a portion of the left upper lobe is obscured by a pacemaker device. Pacemaker lead is attached to the right ventricle. Heart is upper normal in size. There is distortion of pulmonary vascularity due to fibrosis bilaterally. Patient is status post coronary artery bypass grafting. No adenopathy is appreciable by radiography. There is aortic atherosclerosis. No bone lesions. IMPRESSION: Perihilar fibrosis bilaterally, more severe on the left than on the right. No edema or consolidation evident. Heart upper normal in size. No adenopathy appreciable by radiography. Pacer lead tip in right ventricle. Aortic Atherosclerosis (ICD10-I70.0). Electronically Signed   By: Lowella Grip III M.D.   On: 11/14/2019 14:52   VAS [place] ABI WITH/WO TBI  Result Date: 11/14/2019 LOWER EXTREMITY DOPPLER STUDY Indications: Claudication, and today patient denies any pain in his legs when              walking. His chief complaint today is increase swelling and pain in              the left foot x 2 months. The swelling has dissipated, but the pain              remains at the ankles. He also c/o of tingling sensations in both              feet and intermittent "shooting" pain that start at the heel to the              toes, left worse than right. Patient does state he has a history of              gout. High Risk Factors: Hypertension, past history of smoking, coronary artery  disease. Other Factors: CABG x 5,  COPD.  Comparison Study: In 05/2018, an arterial Doppler showed an ABI of .75 on the                   right and .70 on the left. Performing Technologist: Sharlett Iles RVT  Examination Guidelines: A complete evaluation includes at minimum, Doppler waveform signals and systolic blood pressure reading at the level of bilateral brachial, anterior tibial, and posterior tibial arteries, when vessel segments are accessible. Bilateral testing is considered an integral part of a complete examination. Photoelectric Plethysmograph (PPG) waveforms and toe systolic pressure readings are included as required and additional duplex testing as needed. Limited examinations for reoccurring indications may be performed as noted.  ABI Findings: +---------+------------------+-----+----------+--------+ Right    Rt Pressure (mmHg)IndexWaveform  Comment  +---------+------------------+-----+----------+--------+ Brachial 115                                       +---------+------------------+-----+----------+--------+ ATA      74                0.63 monophasic         +---------+------------------+-----+----------+--------+ PTA      83                0.70 monophasic         +---------+------------------+-----+----------+--------+ PERO     74                0.63 monophasic         +---------+------------------+-----+----------+--------+ Great Toe48                0.41 Abnormal           +---------+------------------+-----+----------+--------+ +---------+------------------+-----+----------+-------+ Left     Lt Pressure (mmHg)IndexWaveform  Comment +---------+------------------+-----+----------+-------+ Brachial 118                                      +---------+------------------+-----+----------+-------+ ATA      81                0.69 monophasic        +---------+------------------+-----+----------+-------+ PTA      77                0.65 monophasic         +---------+------------------+-----+----------+-------+ PERO     56                0.47 monophasic        +---------+------------------+-----+----------+-------+ Great Toe54                0.46 Abnormal          +---------+------------------+-----+----------+-------+ +-------+-----------+-----------+------------+------------+ ABI/TBIToday's ABIToday's TBIPrevious ABIPrevious TBI +-------+-----------+-----------+------------+------------+ Right  .70        .41        .75         .18          +-------+-----------+-----------+------------+------------+ Left   .69        .46        .70         .26          +-------+-----------+-----------+------------+------------+ Bilateral ABIs appear essentially unchanged compared to prior study on 06/07/2018. Bilateral TBIs appear increased compared to prior study on 06/07/2018.  Summary: Right: Resting right ankle-brachial index indicates  moderate right lower extremity arterial disease. The right toe-brachial index is abnormal. TBIs increased by .23. Left: Resting left ankle-brachial index indicates moderate left lower extremity arterial disease. The left toe-brachial index is abnormal. TBIs increased by .20.  *See table(s) above for measurements and observations.  Electronically signed by Carlyle Dolly MD on 11/14/2019 at 5:42:01 PM.    Final    CUP [place]  Result Date: 11/21/2019 ICD check in clinic. Normal device function. Thresholds and sensing consistent with previous device measurements. Impedance trends stable over time. No mode switches. No ventricular arrhythmias. Histogram distribution appropriate for patient and level of  activity. No changes made this session. Device programmed at appropriate safety margins.  Estimated longevity 4 yr, 6 mo. Pt enrolled in remote follow-up. RTC 6 months. Next remote 12/26/2019   ASSESSMENT AND PLAN: This is a very pleasant 69 years old male with stage IIIc non-small cell lung  cancer, squamous cell carcinoma presented with left upper lobe pleural-based mass in addition to left hilar and right paramediastinal lymphadenopathy. The patient underwent a course of concurrent chemoradiation with weekly carboplatin and paclitaxel status post 7 cycles.   He tolerated his treatment well except for odynophagia and fatigue. Has a scan showed partial response after the treatment but he declined consolidation immunotherapy.  He is currently on observation. The patient is doing fine with no concerning complaints. He had repeat CT scan of the chest without contrast performed recently.  I personally and independently reviewed the scans and discussed the results with the patient today. His scan showed no concerning findings for disease progression. I recommended for the patient to continue on observation with repeat CT scan of the chest in 3 months. For the congestive heart failure the patient will continue his routine follow-up visit and evaluation by cardiology. He was advised to call immediately if he has any concerning symptoms in the interval.  The patient voices understanding of current disease status and treatment options and is in agreement with the current care plan.  All questions were answered. The patient knows to call the clinic with any problems, questions or concerns. We can certainly see the patient much sooner if necessary.  Disclaimer: This note was dictated with voice recognition software. Similar sounding words can inadvertently be transcribed and may not be corrected upon review.     For long-term

## 2019-12-14 DIAGNOSIS — I5043 Acute on chronic combined systolic (congestive) and diastolic (congestive) heart failure: Secondary | ICD-10-CM | POA: Diagnosis not present

## 2019-12-14 DIAGNOSIS — M109 Gout, unspecified: Secondary | ICD-10-CM | POA: Diagnosis not present

## 2019-12-14 DIAGNOSIS — I130 Hypertensive heart and chronic kidney disease with heart failure and stage 1 through stage 4 chronic kidney disease, or unspecified chronic kidney disease: Secondary | ICD-10-CM | POA: Diagnosis not present

## 2019-12-14 DIAGNOSIS — E669 Obesity, unspecified: Secondary | ICD-10-CM | POA: Diagnosis not present

## 2019-12-14 DIAGNOSIS — E1122 Type 2 diabetes mellitus with diabetic chronic kidney disease: Secondary | ICD-10-CM | POA: Diagnosis not present

## 2019-12-14 DIAGNOSIS — M1990 Unspecified osteoarthritis, unspecified site: Secondary | ICD-10-CM | POA: Diagnosis not present

## 2019-12-14 DIAGNOSIS — Z87891 Personal history of nicotine dependence: Secondary | ICD-10-CM | POA: Diagnosis not present

## 2019-12-14 DIAGNOSIS — N1831 Chronic kidney disease, stage 3a: Secondary | ICD-10-CM | POA: Diagnosis not present

## 2019-12-14 DIAGNOSIS — E785 Hyperlipidemia, unspecified: Secondary | ICD-10-CM | POA: Diagnosis not present

## 2019-12-15 ENCOUNTER — Telehealth: Payer: Self-pay | Admitting: Internal Medicine

## 2019-12-15 DIAGNOSIS — E119 Type 2 diabetes mellitus without complications: Secondary | ICD-10-CM | POA: Diagnosis not present

## 2019-12-15 DIAGNOSIS — H40013 Open angle with borderline findings, low risk, bilateral: Secondary | ICD-10-CM | POA: Diagnosis not present

## 2019-12-15 NOTE — Telephone Encounter (Signed)
Scheduled per los. Called and left msg. Mailed printout  °

## 2019-12-18 NOTE — Progress Notes (Signed)
PCP: [name] [facility]) Cardiac Surgeon: Dr Roxan Hockey Pulmonologist: Dr. Lamonte Sakai Cardiology: Dr. Aundra Dubin  HPI:  [name] [name] is a 69 y.o. with history of COPD, HTN, 3V CAD s/p CABG x 5  (CABG 04/2013: LIMA to LAD, SVG to second diagonal, SVG to ramus intermediate and obtuse marginal 2, SVG to posterior descending), ischemic cardiomyopathy, chronic systolic HF, DM2, PVD and tobacco abuse. St Jude ICD.   Lexiscan Cardiolite in 12/19 showed inferior infarct, no ischemia.  Echo in 12/19 showed EF 35%, mild LVH, mildly decreased RV systolic function.  ABIs in 12/19 were stable compared to the past.    In the fall of 2020, he was diagnosed with non-small cell lung cancer.  He has been treated with radiation and carboplatin/palclitaxel.  He has lost over 20 lbs.  He was admitted 1/21 at [facility] with ICD shocks.  These were found to be inappropriate shocks probably due to atrial fibrillation.  He is now on amiodarone and Eliquis.    TEE in 3/21 was done to assess mitral regurgitation, showing EF 25-30%, mildly decreased RV systolic function, 3+ likely ischemic MR.   Had recent clinic f/u with Dr. Aundra Dubin 09/29/19. Was doing fairly well. Had quit smoking. NYHA Class II-III. Entresto was added to regimen and furosemide was reduced to 40 mg once daily. BMP followed and renal function and potassium both remained stable. There was also concern regarding medication compliance and he was referred to paramedicine.   Recently presented to HF Clinic with [name] Jester PA-C on 10/30/2019. Reported doing well. Tolerating Entresto ok. Weight was up 3 lb from previous visit but Corvue ok. Impedence above reference curve. No AT/AF. No VT/VF. BP 116/64 (just took am meds). Denied dizziness, syncope/ near syncope. Weight had been stable at home. Remained NYHA Class II-III. Denied CP. No ICD shocks.    Recent admission for symptomatic anemia,  HgB 6.5 (down from 10.7) on 11/14/19. Patient received 2 units  of PRBC. GI was consulted, underwent endoscopy as well as a colonoscopy on 11/16/19 without clear source of bleed but it did show hemorrhoids. There were several polyps found in the colon, status post removal. GI recommended to hold Eliquis for 3 days following the colonoscopy, and he was instructed to resume the Eliquis on 11/19/19.  Recently presented to clinic for pharmacist medication titration on 11/27/19.  Overall he was feeling well. No dizziness, lightheadedness, chest pain or palpitations. No abnormal bleeding/bruising or blood in urine/stool with Eliquis. Could walk about 1/2 mile before needing to stop. Walks with a cane. He reported weighing himself daily at home and his weight had been stable at 192-195 lbs. No LEE, PND or orthopnea. His appetite was good. Taking all medications as prescribed and tolerating all medications. His home health nurse helps him with medications and fills his pill box.  Today he returns to HF clinic for pharmacist medication titration. At last visit to pharmacy clinic, Entresto 24/26 mg BID was restarted and furosemide was decreased to 20 mg daily. Overall he is doing well today. Only complaint is some shoulder pain which is musculoskeletal in nature. No dizziness, lightheadedness, chest pain or palpitations. Walks with a cane. No SOB/DOE. He takes furosemide 20 mg daily and has not needed any extra. His weight at home has been stable at 200 lbs. No LEE, PND or orthopnea. Taking all medications as prescribed and tolerating all medications.    HF Medications: Metoprolol succinate 50 mg BID Entresto 24/26 mg BID   Spironolactone 25 mg daily  Hydralazine 12.5 mg TID Furosemide 20 mg daily Potassium chloride 20 mEq daily  Has the patient been experiencing any side effects to the medications prescribed?  no  Does the patient have any problems obtaining medications due to transportation or finances?   No. Has combined [employer] Medicare and Medicaid.    Understanding  of regimen: good Understanding of indications: good Potential of compliance: fair Patient understands to avoid NSAIDs. Patient understands to avoid decongestants.    Pertinent Lab Values (12/11/19): Marland Kitchen Serum creatinine 1.81, BUN 22, Potassium 4.1, Sodium 142  Vital Signs: . Weight: 203.6 lbs (last clinic weight: 200.4 lbs) . Blood pressure: 104/62 . Heart rate: 74   Assessment: 1. CAD s/p CABG: Had cath in 10/16 with patent grafts, medical management.  Cardiolite in 12/19 showed no ischemia (just prior infarction).  - denies anginal symptoms  - Continue statin + ? blocker - off ASA due to Eliquis  2. Chronic systolic HF: Ischemic cardiomyopathy, St Jude ICD. Echo 12/19 with EF 35%.   - chronically NYHA Class II-III, think he is primarily limited by deconditioning and COPD.  - Euvolemic on exam    - Vitals: BP 104/62, HR 74 - Continue furosemide 20 mg daily.  - Continue Metoprolol succinate 50 mg BID - Continue Entresto 24-26 mg BID.  - Continue spironolactone 25 mg daily.  - Continue hydralazine 12.5 mg TID.  - Start dapagliflozin (Farxiga) 10 mg daily. Repeat BMET in 4 weeks.  3. COPD/smoking: He has now quit smoking. He was congratulated on his efforts.  4. PAD: Stable claudication and stable ABIs in 12/19. He tries to walk through the pain.   - encouraged to continue walking  - Continue statin.   - Repeat LE arterial dopplers w/ ABIs 5. CKD: Stage 3.   - Last Scr 1.81 (stable)  6. Atrial fibrillation: Paroxysmal.  RRR on exam. Had inappropriate ICD shocks in setting of atrial fibrillation with RVR in the past  - device interrogation shows no AT/AF episodes. No VT/VF - Continue Eliquis. Recent admission for symptomatic anemia (HgB decreased to 6.5). Eliquis was held, then restarted.  - Continue amiodarone, 200 mg daily. Follow LFTs and TSH q6 months (TSH 2/21 and CMP 3/21 ok), will need regular eye exam.  7. Elevated LFTs: Resolved on 3/21 labs.  8. Lung cancer: NSCLC.   Radiation is completed, still getting chemotherapy   Plan: 1) Medication changes: Based on clinical presentation, vital signs and recent labs will start dapagliflozin 10 mg daily.  2) Follow-up:4 weeks with Dr. Rush Farmer, PharmD, BCPS, [facility], CPP Heart Failure Clinic Pharmacist [phone]

## 2019-12-19 DIAGNOSIS — E669 Obesity, unspecified: Secondary | ICD-10-CM | POA: Diagnosis not present

## 2019-12-19 DIAGNOSIS — N1831 Chronic kidney disease, stage 3a: Secondary | ICD-10-CM | POA: Diagnosis not present

## 2019-12-19 DIAGNOSIS — Z87891 Personal history of nicotine dependence: Secondary | ICD-10-CM | POA: Diagnosis not present

## 2019-12-19 DIAGNOSIS — I130 Hypertensive heart and chronic kidney disease with heart failure and stage 1 through stage 4 chronic kidney disease, or unspecified chronic kidney disease: Secondary | ICD-10-CM | POA: Diagnosis not present

## 2019-12-19 DIAGNOSIS — M109 Gout, unspecified: Secondary | ICD-10-CM | POA: Diagnosis not present

## 2019-12-19 DIAGNOSIS — E785 Hyperlipidemia, unspecified: Secondary | ICD-10-CM | POA: Diagnosis not present

## 2019-12-19 DIAGNOSIS — M1990 Unspecified osteoarthritis, unspecified site: Secondary | ICD-10-CM | POA: Diagnosis not present

## 2019-12-19 DIAGNOSIS — I5043 Acute on chronic combined systolic (congestive) and diastolic (congestive) heart failure: Secondary | ICD-10-CM | POA: Diagnosis not present

## 2019-12-19 DIAGNOSIS — E1122 Type 2 diabetes mellitus with diabetic chronic kidney disease: Secondary | ICD-10-CM | POA: Diagnosis not present

## 2019-12-20 ENCOUNTER — Other Ambulatory Visit: Payer: Self-pay | Admitting: Internal Medicine

## 2019-12-20 DIAGNOSIS — I130 Hypertensive heart and chronic kidney disease with heart failure and stage 1 through stage 4 chronic kidney disease, or unspecified chronic kidney disease: Secondary | ICD-10-CM | POA: Diagnosis not present

## 2019-12-20 DIAGNOSIS — M109 Gout, unspecified: Secondary | ICD-10-CM | POA: Diagnosis not present

## 2019-12-20 DIAGNOSIS — Z87891 Personal history of nicotine dependence: Secondary | ICD-10-CM | POA: Diagnosis not present

## 2019-12-20 DIAGNOSIS — E1122 Type 2 diabetes mellitus with diabetic chronic kidney disease: Secondary | ICD-10-CM | POA: Diagnosis not present

## 2019-12-20 DIAGNOSIS — E669 Obesity, unspecified: Secondary | ICD-10-CM | POA: Diagnosis not present

## 2019-12-20 DIAGNOSIS — I5043 Acute on chronic combined systolic (congestive) and diastolic (congestive) heart failure: Secondary | ICD-10-CM | POA: Diagnosis not present

## 2019-12-20 DIAGNOSIS — N1831 Chronic kidney disease, stage 3a: Secondary | ICD-10-CM | POA: Diagnosis not present

## 2019-12-20 DIAGNOSIS — E785 Hyperlipidemia, unspecified: Secondary | ICD-10-CM | POA: Diagnosis not present

## 2019-12-20 DIAGNOSIS — M1990 Unspecified osteoarthritis, unspecified site: Secondary | ICD-10-CM | POA: Diagnosis not present

## 2019-12-26 ENCOUNTER — Ambulatory Visit (INDEPENDENT_AMBULATORY_CARE_PROVIDER_SITE_OTHER): Payer: Medicare HMO | Admitting: *Deleted

## 2019-12-26 DIAGNOSIS — I255 Ischemic cardiomyopathy: Secondary | ICD-10-CM

## 2019-12-27 DIAGNOSIS — Z6831 Body mass index (BMI) 31.0-31.9, adult: Secondary | ICD-10-CM | POA: Diagnosis not present

## 2019-12-27 DIAGNOSIS — D500 Iron deficiency anemia secondary to blood loss (chronic): Secondary | ICD-10-CM | POA: Diagnosis not present

## 2019-12-27 DIAGNOSIS — I10 Essential (primary) hypertension: Secondary | ICD-10-CM | POA: Diagnosis not present

## 2019-12-27 DIAGNOSIS — I509 Heart failure, unspecified: Secondary | ICD-10-CM | POA: Diagnosis not present

## 2019-12-27 LAB — CUP PACEART REMOTE DEVICE CHECK
Battery Remaining Longevity: 54 mo
Battery Remaining Percentage: 53 %
Battery Voltage: 2.92 V
Brady Statistic RV Percent Paced: 1 %
Date Time Interrogation Session: 20210706020016
HighPow Impedance: 55 Ohm
HighPow Impedance: 55 Ohm
Implantable Lead Implant Date: 20160115
Implantable Lead Location: 753860
Implantable Lead Model: 7122
Implantable Pulse Generator Implant Date: 20160115
Lead Channel Impedance Value: 360 Ohm
Lead Channel Pacing Threshold Amplitude: 1 V
Lead Channel Pacing Threshold Pulse Width: 0.5 ms
Lead Channel Sensing Intrinsic Amplitude: 4.8 mV
Lead Channel Setting Pacing Amplitude: 2.5 V
Lead Channel Setting Pacing Pulse Width: 0.5 ms
Lead Channel Setting Sensing Sensitivity: 0.5 mV
Pulse Gen Serial Number: 7233603

## 2019-12-27 NOTE — Progress Notes (Signed)
Remote ICD transmission.   

## 2019-12-28 ENCOUNTER — Other Ambulatory Visit: Payer: Self-pay

## 2019-12-28 ENCOUNTER — Ambulatory Visit (HOSPITAL_COMMUNITY)
Admission: RE | Admit: 2019-12-28 | Discharge: 2019-12-28 | Disposition: A | Discharge status: Home / Self Care | Payer: Medicare HMO | Source: Ambulatory Visit | Attending: Internal Medicine | Admitting: Internal Medicine

## 2019-12-28 VITALS — BP 104/62 | HR 74 | Wt 203.6 lb

## 2019-12-28 DIAGNOSIS — Z85118 Personal history of other malignant neoplasm of bronchus and lung: Secondary | ICD-10-CM | POA: Diagnosis not present

## 2019-12-28 DIAGNOSIS — Z79899 Other long term (current) drug therapy: Secondary | ICD-10-CM | POA: Diagnosis not present

## 2019-12-28 DIAGNOSIS — Z8719 Personal history of other diseases of the digestive system: Secondary | ICD-10-CM | POA: Diagnosis not present

## 2019-12-28 DIAGNOSIS — J449 Chronic obstructive pulmonary disease, unspecified: Secondary | ICD-10-CM | POA: Diagnosis not present

## 2019-12-28 DIAGNOSIS — Z955 Presence of coronary angioplasty implant and graft: Secondary | ICD-10-CM | POA: Insufficient documentation

## 2019-12-28 DIAGNOSIS — Z95810 Presence of automatic (implantable) cardiac defibrillator: Secondary | ICD-10-CM | POA: Insufficient documentation

## 2019-12-28 DIAGNOSIS — E1122 Type 2 diabetes mellitus with diabetic chronic kidney disease: Secondary | ICD-10-CM | POA: Insufficient documentation

## 2019-12-28 DIAGNOSIS — I4891 Unspecified atrial fibrillation: Secondary | ICD-10-CM | POA: Diagnosis not present

## 2019-12-28 DIAGNOSIS — I340 Nonrheumatic mitral (valve) insufficiency: Secondary | ICD-10-CM | POA: Insufficient documentation

## 2019-12-28 DIAGNOSIS — Z7901 Long term (current) use of anticoagulants: Secondary | ICD-10-CM | POA: Diagnosis not present

## 2019-12-28 DIAGNOSIS — Z951 Presence of aortocoronary bypass graft: Secondary | ICD-10-CM | POA: Insufficient documentation

## 2019-12-28 DIAGNOSIS — N183 Chronic kidney disease, stage 3 unspecified: Secondary | ICD-10-CM | POA: Diagnosis not present

## 2019-12-28 DIAGNOSIS — I2510 Atherosclerotic heart disease of native coronary artery without angina pectoris: Secondary | ICD-10-CM | POA: Diagnosis not present

## 2019-12-28 DIAGNOSIS — Z86010 Personal history of colonic polyps: Secondary | ICD-10-CM | POA: Diagnosis not present

## 2019-12-28 DIAGNOSIS — I130 Hypertensive heart and chronic kidney disease with heart failure and stage 1 through stage 4 chronic kidney disease, or unspecified chronic kidney disease: Secondary | ICD-10-CM | POA: Diagnosis not present

## 2019-12-28 DIAGNOSIS — Z923 Personal history of irradiation: Secondary | ICD-10-CM | POA: Diagnosis not present

## 2019-12-28 DIAGNOSIS — E1151 Type 2 diabetes mellitus with diabetic peripheral angiopathy without gangrene: Secondary | ICD-10-CM | POA: Diagnosis not present

## 2019-12-28 DIAGNOSIS — Z87891 Personal history of nicotine dependence: Secondary | ICD-10-CM | POA: Insufficient documentation

## 2019-12-28 DIAGNOSIS — I5022 Chronic systolic (congestive) heart failure: Secondary | ICD-10-CM | POA: Insufficient documentation

## 2019-12-28 DIAGNOSIS — I255 Ischemic cardiomyopathy: Secondary | ICD-10-CM | POA: Diagnosis not present

## 2019-12-28 MED ORDER — DAPAGLIFLOZIN PROPANEDIOL 10 MG PO TABS
10.0000 mg | ORAL_TABLET | Freq: Every day | ORAL | 11 refills | Status: DC
Start: 2019-12-28 — End: 2020-03-05

## 2019-12-28 NOTE — Patient Instructions (Addendum)
It was a pleasure seeing you today!  MEDICATIONS: -We are changing your medications today -Start Farxiga (dapagliflozin) 10 mg (1 tablet) daily -Call if you have questions about your medications.   NEXT APPOINTMENT: Return to clinic in 1 month with Dr. Aundra Dubin.  In general, to take care of your heart failure: -Limit your fluid intake to 2 Liters (half-gallon) per day.   -Limit your salt intake to ideally 2-3 grams (2000-3000 mg) per day. -Weigh yourself daily and record, and bring that "weight diary" to your next appointment.  (Weight gain of 2-3 pounds in 1 day typically means fluid weight.) -The medications for your heart are to help your heart and help you live longer.   -Please contact us before stopping any of your heart medications.  Call the clinic at [phone] with questions or to reschedule future appointments.

## 2020-01-02 ENCOUNTER — Other Ambulatory Visit (HOSPITAL_COMMUNITY): Payer: Self-pay | Admitting: Cardiology

## 2020-01-02 DIAGNOSIS — E1122 Type 2 diabetes mellitus with diabetic chronic kidney disease: Secondary | ICD-10-CM | POA: Diagnosis not present

## 2020-01-02 DIAGNOSIS — Z6831 Body mass index (BMI) 31.0-31.9, adult: Secondary | ICD-10-CM | POA: Diagnosis not present

## 2020-01-02 DIAGNOSIS — E785 Hyperlipidemia, unspecified: Secondary | ICD-10-CM | POA: Diagnosis not present

## 2020-01-02 DIAGNOSIS — R0789 Other chest pain: Secondary | ICD-10-CM | POA: Diagnosis not present

## 2020-01-02 DIAGNOSIS — M1990 Unspecified osteoarthritis, unspecified site: Secondary | ICD-10-CM | POA: Diagnosis not present

## 2020-01-02 DIAGNOSIS — M109 Gout, unspecified: Secondary | ICD-10-CM | POA: Diagnosis not present

## 2020-01-02 DIAGNOSIS — I5043 Acute on chronic combined systolic (congestive) and diastolic (congestive) heart failure: Secondary | ICD-10-CM | POA: Diagnosis not present

## 2020-01-02 DIAGNOSIS — Z87891 Personal history of nicotine dependence: Secondary | ICD-10-CM | POA: Diagnosis not present

## 2020-01-02 DIAGNOSIS — N1831 Chronic kidney disease, stage 3a: Secondary | ICD-10-CM | POA: Diagnosis not present

## 2020-01-02 DIAGNOSIS — I2510 Atherosclerotic heart disease of native coronary artery without angina pectoris: Secondary | ICD-10-CM | POA: Diagnosis not present

## 2020-01-02 DIAGNOSIS — I130 Hypertensive heart and chronic kidney disease with heart failure and stage 1 through stage 4 chronic kidney disease, or unspecified chronic kidney disease: Secondary | ICD-10-CM | POA: Diagnosis not present

## 2020-01-02 DIAGNOSIS — E669 Obesity, unspecified: Secondary | ICD-10-CM | POA: Diagnosis not present

## 2020-01-05 ENCOUNTER — Encounter: Payer: Self-pay | Admitting: Sports Medicine

## 2020-01-05 ENCOUNTER — Other Ambulatory Visit: Payer: Self-pay

## 2020-01-05 ENCOUNTER — Ambulatory Visit (INDEPENDENT_AMBULATORY_CARE_PROVIDER_SITE_OTHER): Payer: Medicare HMO | Admitting: Sports Medicine

## 2020-01-05 DIAGNOSIS — M79675 Pain in left toe(s): Secondary | ICD-10-CM | POA: Diagnosis not present

## 2020-01-05 DIAGNOSIS — E1140 Type 2 diabetes mellitus with diabetic neuropathy, unspecified: Secondary | ICD-10-CM | POA: Diagnosis not present

## 2020-01-05 DIAGNOSIS — M25512 Pain in left shoulder: Secondary | ICD-10-CM | POA: Diagnosis not present

## 2020-01-05 DIAGNOSIS — I739 Peripheral vascular disease, unspecified: Secondary | ICD-10-CM

## 2020-01-05 DIAGNOSIS — R918 Other nonspecific abnormal finding of lung field: Secondary | ICD-10-CM | POA: Diagnosis not present

## 2020-01-05 DIAGNOSIS — R079 Chest pain, unspecified: Secondary | ICD-10-CM | POA: Diagnosis not present

## 2020-01-05 DIAGNOSIS — L853 Xerosis cutis: Secondary | ICD-10-CM

## 2020-01-05 DIAGNOSIS — B351 Tinea unguium: Secondary | ICD-10-CM | POA: Diagnosis not present

## 2020-01-05 DIAGNOSIS — M19012 Primary osteoarthritis, left shoulder: Secondary | ICD-10-CM | POA: Diagnosis not present

## 2020-01-05 DIAGNOSIS — M79674 Pain in right toe(s): Secondary | ICD-10-CM

## 2020-01-05 NOTE — Progress Notes (Signed)
Patient ID: [name] Whittlesey., male   DOB: [birth year], 69 y.o.   MRN: [id number]  Subjective: [name] Hostetler. is a 69 y.o. male patient with history of type 2 diabetes who returns to office today complaining of long, painful nails  while ambulating in shoes, unable to trim. Patient states that the glucose reading was recorded and does not remember last A1c.  Last visit to PCP was on Monday.  No other problems.   Patient Active Problem List   Diagnosis Date Noted  . Symptomatic anemia 11/14/2019  . Prolonged QT interval 11/14/2019  . GIB (gastrointestinal bleeding) 11/14/2019  . Hypokalemia 11/14/2019  . Chronic kidney disease, stage III (moderate) 11/14/2019  . History of lung cancer 11/14/2019  . Anemia of chronic disease 08/09/2019  . Altered mental status 07/25/2019  . Peripheral neuropathy 07/25/2019  . Dehydration 06/27/2019  . Primary malignant neoplasm of bronchus of left upper lobe ([place]) 05/24/2019  . 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 Encounter for antineoplastic chemotherapy 04/27/2019  . Goals of care, counseling/discussion 04/27/2019  . 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 Avascular necrosis of hip, right ([place]) 11/30/2018  . Chest pain 04/08/2015  . Abnormal nuclear stress test 04/08/2015  . Ischemic cardiomyopathy 07/06/2014  . Lumbago 03/13/2014  . Special screening for malignant neoplasms, colon 02/09/2014  . Normocytic anemia 02/09/2014  . Pleural effusion on left 11/28/2013  . Atherosclerosis of native arteries of extremity with intermittent claudication ([place]) 11/07/2013  . Weakness of both legs 11/07/2013  . Smoker 09/19/2013  . Numbness in right leg/ Foot 06/14/2013  . Pain in limb-Right Leg/foot 06/14/2013  . Atherosclerosis of native arteries of the extremities with ulceration(440.23) 06/14/2013  . Chronic systolic heart failure ([place]) 05/29/2013  . Atrial fibrillation ([place]) 05/29/2013  . Chest pain, mid sternal  05/19/2013  . S/P CABG x 5 05/08/2013  . Right foot ulcer ([place]) 05/04/2013  . Coronary atherosclerosis of native coronary artery 04/28/2013  . Acute systolic heart failure ([place]) 04/28/2013  . Abnormal blood chemistry 12/30/2012  . Abnormal LFTs 12/30/2012  . Aspiration pneumonia ([place]) 12/30/2012  . Chronic congestive heart failure ([place]) 12/27/2012  . Leg weakness 12/26/2012  . Pneumonia 05/18/2011  . Gout 05/17/2011  . Respiratory distress, acute 05/17/2011  . Hypertension 05/17/2011  . COPD (chronic obstructive pulmonary disease) ([place]) 05/17/2011  . CHF, acute ([place]) 05/17/2011   Current Outpatient Medications on File Prior to Visit  Medication Sig Dispense Refill  . acetaminophen (TYLENOL) 325 MG tablet Take 650 mg by mouth every 6 (six) hours as needed for mild pain or moderate pain.     Marland Kitchen albuterol (ACCUNEB) 1.25 MG/3ML nebulizer solution Take 1 ampule by nebulization every 6 (six) hours as needed for wheezing.    Marland Kitchen albuterol (VENTOLIN HFA) 108 (90 Base) MCG/ACT inhaler Inhale 1-2 puffs into the lungs every 6 (six) hours as needed for wheezing or shortness of breath. 6.7 g 6  . allopurinol (ZYLOPRIM) 300 MG tablet Take 300 mg by mouth daily.    Marland Kitchen amiodarone (PACERONE) 400 MG tablet Take 0.5 tablets (200 mg total) by mouth daily. 30 tablet 5  . atorvastatin (LIPITOR) 20 MG tablet Take 20 mg by mouth daily.    Marland Kitchen azelastine (OPTIVAR) 0.05 % ophthalmic solution Place 1 drop into the left eye 2 (two) times daily.   0  . b complex vitamins tablet Take 1 tablet by mouth at bedtime.     Marland Kitchen  B Complex-C-Folic Acid (RENA-VITE RX) 1 MG TABS Take 1 tablet by mouth daily.    . chlorproMAZINE (THORAZINE) 25 MG tablet Take 1 tablet (25 mg total) by mouth 3 (three) times daily as needed. 30 tablet 0  . Cholecalciferol (VITAMIN D3) 25 MCG (1000 UT) CAPS Take 1,000 Units by mouth daily at 12 noon.     . dapagliflozin propanediol (FARXIGA) 10 MG TABS tablet Take 1 tablet (10 mg total) by mouth daily  before breakfast. 30 tablet 11  . ELIQUIS 5 MG TABS tablet Take 5 mg by mouth 2 (two) times daily.     . ferrous gluconate (FERGON) 324 MG tablet Take 1 tablet (324 mg total) by mouth daily with breakfast. 30 tablet 1  . furosemide (LASIX) 40 MG tablet Take 0.5 tablets (20 mg total) by mouth daily. 15 tablet 6  . guaiFENesin-dextromethorphan (ROBITUSSIN DM) 100-10 MG/5ML syrup Take 5 mLs by mouth every 4 (four) hours as needed for cough.    . hydrALAZINE (APRESOLINE) 25 MG tablet Take 0.5 tablets (12.5 mg total) by mouth 3 (three) times daily. 135 tablet 3  . Melatonin 3 MG TABS Take 3 mg by mouth at bedtime.    . metoprolol succinate (TOPROL-XL) 50 MG 24 hr tablet Take 50 mg by mouth in the morning and at bedtime.     . nitroGLYCERIN (NITROSTAT) 0.4 MG SL tablet Place 0.4 mg under the tongue every 5 (five) minutes as needed for chest pain.     Marland Kitchen omeprazole (PRILOSEC) 40 MG capsule Take 40 mg by mouth daily.    . potassium chloride SA (KLOR-CON) 20 MEQ tablet Take 1 tablet (20 mEq total) by mouth daily. 2 tablet 0  . sacubitril-valsartan (ENTRESTO) 24-26 MG Take 1 tablet by mouth 2 (two) times daily. 60 tablet 11  . senna (SENOKOT) 8.6 MG TABS tablet Take 1 tablet (8.6 mg total) by mouth 2 (two) times daily. 120 each 0  . SPIRIVA HANDIHALER 18 MCG inhalation capsule Place 1 capsule into inhaler and inhale daily.    Marland Kitchen spironolactone (ALDACTONE) 25 MG tablet Take 25 mg by mouth daily.    . sucralfate (CARAFATE) 1 g tablet Take 1 tablet (1 g total) by mouth 4 (four) times daily -  with meals and at bedtime. 5 min before meals for radiation induced esophagitis 120 tablet 2  . tiZANidine (ZANAFLEX) 4 MG capsule Take 4 mg by mouth 3 (three) times daily as needed for muscle spasms.      No current facility-administered medications on file prior to visit.   Allergies  Allergen Reactions  . Penicillins Nausea And Vomiting, Swelling and Other (See Comments)    Per Dr Audria Nine Via phone 12/27/12  0400 SWELLING REACTION UNSPECIFIED   Did it involve swelling of the face/tongue/throat, SOB, or low BP? Unknown Did it involve sudden or severe rash/hives, skin peeling, or any reaction on the inside of your mouth or nose? Unknown Did you need to seek medical attention at a hospital or doctor's office? Unknown When did it last happen?Childhood If all above answers are "NO", may proceed with cephalosporin use.   Objective: General: Patient is awake, alert, and oriented x 3 and in no acute distress.  Integument: Skin is warm, dry and supple bilateral. Continued  xerosis bilateral, slowly improving like before since patient had a home aide who has been helping him apply daily skin emollients. Nails are tender, long, thickened and dystrophic with subungual debris, consistent with onychomycosis, 1-5 bilateral. No  signs of infection.  Minimal callus to 1st toes.  Remaining integument unremarkable.  Vasculature:  Dorsalis Pedis pulse 1/4 bilateral. Posterior Tibial pulse  0/4 bilateral.  Capillary fill time <3 sec 1-5 bilateral. Scant hair growth to the level of the digits. Temperature gradient within normal limits. No varicosities present bilateral. Mild trace edema present bilateral ankles.   Neurology: The patient has diminished sensation measured with a 5.07/10g Semmes Weinstein Monofilament at all pedal sites bilateral . Vibratory sensation diminished bilateral with tuning fork. No Babinski sign present bilateral.   Musculoskeletal: Asymptomatioc pes planus and bunion pedal deformities noted bilateral. Muscular strength 5/5 in all lower extremity muscular groups bilateral without pain or limitation on range of motion. No tenderness with calf compression bilateral.  Assessment and Plan: Problem List Items Addressed This Visit    None    Visit Diagnoses    Pain due to onychomycosis of toenails of both feet    -  Primary   Type 2 diabetes, controlled, with neuropathy ([place])        Peripheral vascular disease ([place])       Xerosis of skin         -Patient seen and evaluated -Discussed the importance of daily foot inspection in the setting of diabetes -Mechanically debrided all nails 1-5 bilateral using sterile nail nipper and filed with dremel without incident  -Continue with daily skin emollients for dry skin; sample of foot miracle cream provided -Return in 3 months for at risk foot care -Patient advised to call the office if any problems or questions arise in the meantime.  Landis Martins, DPM

## 2020-01-15 ENCOUNTER — Other Ambulatory Visit (HOSPITAL_COMMUNITY): Payer: Self-pay | Admitting: Cardiology

## 2020-01-15 DIAGNOSIS — I5043 Acute on chronic combined systolic (congestive) and diastolic (congestive) heart failure: Secondary | ICD-10-CM | POA: Diagnosis not present

## 2020-01-15 DIAGNOSIS — M109 Gout, unspecified: Secondary | ICD-10-CM | POA: Diagnosis not present

## 2020-01-15 DIAGNOSIS — E1122 Type 2 diabetes mellitus with diabetic chronic kidney disease: Secondary | ICD-10-CM | POA: Diagnosis not present

## 2020-01-15 DIAGNOSIS — M1990 Unspecified osteoarthritis, unspecified site: Secondary | ICD-10-CM | POA: Diagnosis not present

## 2020-01-15 DIAGNOSIS — E669 Obesity, unspecified: Secondary | ICD-10-CM | POA: Diagnosis not present

## 2020-01-15 DIAGNOSIS — I130 Hypertensive heart and chronic kidney disease with heart failure and stage 1 through stage 4 chronic kidney disease, or unspecified chronic kidney disease: Secondary | ICD-10-CM | POA: Diagnosis not present

## 2020-01-15 DIAGNOSIS — E785 Hyperlipidemia, unspecified: Secondary | ICD-10-CM | POA: Diagnosis not present

## 2020-01-15 DIAGNOSIS — Z87891 Personal history of nicotine dependence: Secondary | ICD-10-CM | POA: Diagnosis not present

## 2020-01-15 DIAGNOSIS — N1831 Chronic kidney disease, stage 3a: Secondary | ICD-10-CM | POA: Diagnosis not present

## 2020-01-16 DIAGNOSIS — R0789 Other chest pain: Secondary | ICD-10-CM | POA: Diagnosis not present

## 2020-01-16 DIAGNOSIS — Z6831 Body mass index (BMI) 31.0-31.9, adult: Secondary | ICD-10-CM | POA: Diagnosis not present

## 2020-01-23 ENCOUNTER — Encounter (HOSPITAL_COMMUNITY): Payer: Self-pay | Admitting: Cardiology

## 2020-01-23 ENCOUNTER — Other Ambulatory Visit: Payer: Self-pay

## 2020-01-23 ENCOUNTER — Ambulatory Visit (HOSPITAL_COMMUNITY)
Admission: RE | Admit: 2020-01-23 | Discharge: 2020-01-23 | Disposition: A | Discharge status: Home / Self Care | Payer: Medicare HMO | Source: Ambulatory Visit | Attending: Cardiology | Admitting: Cardiology

## 2020-01-23 VITALS — BP 82/60 | HR 59 | Ht 67.0 in | Wt 204.6 lb

## 2020-01-23 DIAGNOSIS — Z8249 Family history of ischemic heart disease and other diseases of the circulatory system: Secondary | ICD-10-CM | POA: Diagnosis not present

## 2020-01-23 DIAGNOSIS — Z7901 Long term (current) use of anticoagulants: Secondary | ICD-10-CM | POA: Diagnosis not present

## 2020-01-23 DIAGNOSIS — I255 Ischemic cardiomyopathy: Secondary | ICD-10-CM | POA: Diagnosis not present

## 2020-01-23 DIAGNOSIS — M109 Gout, unspecified: Secondary | ICD-10-CM | POA: Insufficient documentation

## 2020-01-23 DIAGNOSIS — E785 Hyperlipidemia, unspecified: Secondary | ICD-10-CM | POA: Insufficient documentation

## 2020-01-23 DIAGNOSIS — N183 Chronic kidney disease, stage 3 unspecified: Secondary | ICD-10-CM | POA: Insufficient documentation

## 2020-01-23 DIAGNOSIS — I2510 Atherosclerotic heart disease of native coronary artery without angina pectoris: Secondary | ICD-10-CM | POA: Insufficient documentation

## 2020-01-23 DIAGNOSIS — I4510 Unspecified right bundle-branch block: Secondary | ICD-10-CM | POA: Insufficient documentation

## 2020-01-23 DIAGNOSIS — Z7984 Long term (current) use of oral hypoglycemic drugs: Secondary | ICD-10-CM | POA: Insufficient documentation

## 2020-01-23 DIAGNOSIS — E1122 Type 2 diabetes mellitus with diabetic chronic kidney disease: Secondary | ICD-10-CM | POA: Diagnosis not present

## 2020-01-23 DIAGNOSIS — Z79899 Other long term (current) drug therapy: Secondary | ICD-10-CM | POA: Diagnosis not present

## 2020-01-23 DIAGNOSIS — I130 Hypertensive heart and chronic kidney disease with heart failure and stage 1 through stage 4 chronic kidney disease, or unspecified chronic kidney disease: Secondary | ICD-10-CM | POA: Insufficient documentation

## 2020-01-23 DIAGNOSIS — E1151 Type 2 diabetes mellitus with diabetic peripheral angiopathy without gangrene: Secondary | ICD-10-CM | POA: Diagnosis not present

## 2020-01-23 DIAGNOSIS — I5022 Chronic systolic (congestive) heart failure: Secondary | ICD-10-CM | POA: Diagnosis not present

## 2020-01-23 DIAGNOSIS — I4891 Unspecified atrial fibrillation: Secondary | ICD-10-CM

## 2020-01-23 DIAGNOSIS — J449 Chronic obstructive pulmonary disease, unspecified: Secondary | ICD-10-CM | POA: Insufficient documentation

## 2020-01-23 DIAGNOSIS — M1990 Unspecified osteoarthritis, unspecified site: Secondary | ICD-10-CM | POA: Diagnosis not present

## 2020-01-23 DIAGNOSIS — I340 Nonrheumatic mitral (valve) insufficiency: Secondary | ICD-10-CM | POA: Diagnosis not present

## 2020-01-23 DIAGNOSIS — I480 Paroxysmal atrial fibrillation: Secondary | ICD-10-CM | POA: Insufficient documentation

## 2020-01-23 DIAGNOSIS — Z923 Personal history of irradiation: Secondary | ICD-10-CM | POA: Diagnosis not present

## 2020-01-23 DIAGNOSIS — Z87891 Personal history of nicotine dependence: Secondary | ICD-10-CM | POA: Insufficient documentation

## 2020-01-23 DIAGNOSIS — C3490 Malignant neoplasm of unspecified part of unspecified bronchus or lung: Secondary | ICD-10-CM | POA: Insufficient documentation

## 2020-01-23 DIAGNOSIS — Z951 Presence of aortocoronary bypass graft: Secondary | ICD-10-CM | POA: Diagnosis not present

## 2020-01-23 DIAGNOSIS — I959 Hypotension, unspecified: Secondary | ICD-10-CM | POA: Insufficient documentation

## 2020-01-23 LAB — COMPREHENSIVE METABOLIC PANEL
ALT: 65 U/L — ABNORMAL HIGH (ref 0–44)
AST: 52 U/L — ABNORMAL HIGH (ref 15–41)
Albumin: 3.2 g/dL — ABNORMAL LOW (ref 3.5–5.0)
Alkaline Phosphatase: 88 U/L (ref 38–126)
Anion gap: 9 (ref 5–15)
BUN: 32 mg/dL — ABNORMAL HIGH (ref 8–23)
CO2: 21 mmol/L — ABNORMAL LOW (ref 22–32)
Calcium: 9.4 mg/dL (ref 8.9–10.3)
Chloride: 106 mmol/L (ref 98–111)
Creatinine, Ser: 2.11 mg/dL — ABNORMAL HIGH (ref 0.61–1.24)
GFR calc Af Amer: 36 mL/min — ABNORMAL LOW (ref >60)
GFR calc non Af Amer: 31 mL/min — ABNORMAL LOW (ref >60)
Glucose, Bld: 130 mg/dL — ABNORMAL HIGH (ref 70–99)
Potassium: 4.1 mmol/L (ref 3.5–5.1)
Sodium: 136 mmol/L (ref 135–145)
Total Bilirubin: 0.6 mg/dL (ref 0.3–1.2)
Total Protein: 6.8 g/dL (ref 6.5–8.1)

## 2020-01-23 LAB — CBC
HCT: 32.2 % — ABNORMAL LOW (ref 39.0–52.0)
Hemoglobin: 9.1 g/dL — ABNORMAL LOW (ref 13.0–17.0)
MCH: 25.5 pg — ABNORMAL LOW (ref 26.0–34.0)
MCHC: 28.3 g/dL — ABNORMAL LOW (ref 30.0–36.0)
MCV: 90.2 fL (ref 80.0–100.0)
Platelets: 218 10*3/uL (ref 150–400)
RBC: 3.57 MIL/uL — ABNORMAL LOW (ref 4.22–5.81)
RDW: 19.5 % — ABNORMAL HIGH (ref 11.5–15.5)
WBC: 6.1 10*3/uL (ref 4.0–10.5)
nRBC: 0 % (ref 0.0–0.2)

## 2020-01-23 LAB — TSH: TSH: 0.477 u[IU]/mL (ref 0.350–4.500)

## 2020-01-23 MED ORDER — FUROSEMIDE 40 MG PO TABS: 40.0000 mg | ORAL_TABLET | Freq: Every day | ORAL | 5 refills | Status: DC

## 2020-01-23 NOTE — Patient Instructions (Addendum)
Labs done today. We will contact you only if your labs are abnormal.  STOP taking Carvedilol(Coreg)  STOP taking Hydralazine   INCREASE Lasix 40mg (1 tablet) by mouth daily.  No other medication changes were made. Please continue all other medications as prescribed.  Your provider has requested that you have paramedicine to help you with your medications. They will contact you about setting up a at home visit with you.  Your physician recommends that you schedule a follow-up appointment in: 10 days for a lab only appointment.( will be done at [facility];a hard copy prescription was provided to you today in office.) and in 6 weeks for an appointment with our APP Clinic.  If you have any questions or concerns before your next appointment please send [place] a message through [place] or call our office at [phone].    TO LEAVE A MESSAGE FOR THE NURSE SELECT OPTION 2, PLEASE LEAVE A MESSAGE INCLUDING: . YOUR NAME . DATE OF BIRTH . CALL BACK NUMBER . REASON FOR CALL**this is important as we prioritize the call backs  YOU WILL RECEIVE A CALL BACK THE SAME DAY AS LONG AS YOU CALL BEFORE 4:00 PM   Do the following things EVERYDAY: 1) Weigh yourself in the morning before breakfast. Write it down and keep it in a log. 2) Take your medicines as prescribed 3) Eat low salt foods--Limit salt (sodium) to 2000 mg per day.  4) Stay as active as you can everyday 5) Limit all fluids for the day to less than 2 liters   At the [facility], you and your health needs are our priority. As part of our continuing mission to provide you with exceptional heart care, we have created designated Provider Care Teams. These Care Teams include your primary Cardiologist (physician) and Advanced Practice Providers (APPs- Physician Assistants and Nurse Practitioners) who all work together to provide you with the care you need, when you need it.   You may see any of the following providers on your  designated Care Team at your next follow up: Marland Kitchen Dr Glori Bickers . Dr Loralie Champagne . Darrick Grinder, NP . Lyda Jester, PA . Audry Riles, PharmD   Please be sure to bring in all your medications bottles to every appointment.

## 2020-01-24 NOTE — Progress Notes (Signed)
Patient ID: [name] Torr., male   DOB: 1950/10/17, 69 y.o.   MRN: [id number] PCP: [name] [facility]) Cardiac Surgeon: Dr Roxan Hockey Pulmonologist: Dr. Lamonte Sakai Cardiology: Dr. Aundra Dubin  HPI: [name] [name] is a 69 y.o. with history of COPD, HTN, 3V CAD s/p CABG x 5  (CABG 04/2013: LIMA to LAD, SVG to second diagonal, SVG to ramus intermediate and obtuse marginal 2, SVG to posterior descending), ischemic cardiomyopathy, chronic systolic HF, COPD, DM2, PVD and tobacco abuse. St Jude ICD.   Lexiscan Cardiolite in 12/19 showed inferior infarct, no ischemia.  Echo in 12/19 showed EF 35%, mild LVH, mildly decreased RV systolic function.  ABIs in 12/19 were stable compared to the past.    In the fall of 2020, he was diagnosed with non-small cell lung cancer.  He has been treated with radiation and carboplatin/palclitaxel.  He has lost over 20 lbs.  He was admitted 1/21 at [facility] with ICD shocks.  These were found to be inappropriate shocks probably due to atrial fibrillation.  He is now on amiodarone and Eliquis.    TEE in 3/21 was done to assess mitral regurgitation, showing EF 25-30%, mildly decreased RV systolic function, 3+ likely ischemic MR.  In 5/21, he had symptomatic anemia.  EGD/colonoscopy done, no definite cause. ABIs in 5/21 were stable.   He returns for followup of CHF and CAD. He is not smoking. Weight is up about 6 lbs.  Walks with cane.  No claudication.  No lightheadedness/dizziness, but his BP is low today (82/60).  No chest pain.  Short of breath walking about 1/4 mile on flat ground or walking up stairs.   ECG (personally reviewed): NSR, 1st degree AVB, RBBB, QRS 136 msec  Labs (11/19): K 4.6, creatinine 2.05 Labs(12/19): K 4.3, creatinine 2.07, LDL 57  Labs (9/20): K 4.2, creatinine 2.09, LDL 50 Labs (2/21): TSH normal, K 3.9, creatinine 1.92, AST 56, ALT 176 Labs (3/21): K 3.4, creatinine 1.57, hgb 10.7, LFTs normal Labs (6/21): K 4.1, creatinine 1.81, hgb 9.1, LFTs  normal.     PMH: 1. Chronic systolic CHF: Ischemic cardiomyopathy.  St Jude ICD.  - ECHO 06/26/13 EF 20-25% RV mild HK - ECHO 10/10/13 EF 20-25% restrictive filling pattern. Moderate RV dysfunction. Mild MR. - Cardiolite (10/16) with EF 19%, anterior ischemia.  - Echo 7/17 EF 30-35% - Echo (12/19): EF 35%, mild LVH, basal-mid inferior akinesis, mildly decreased RV systolic function, mild MR.  - TEE (3/21): EF 25-30%, mild LV dilation, mildly decreased RV systolic function, 3+ MR with restricted posterior leaflet, PISA ERO 0.22 cm^2.  2. CAD: CABG x 5 11/14 with LIMA-LAD, SVG-D2, seq SVG-ramus/OM2, SVG-PDA.  - LHC (10/16) with patent LIMA-LAD and 60% pLAD stenosis (competitive flow). SVG-D, sequential SVG-ramus/OM2, SVG-PDA all patent. Medical management.  - Lexiscan Cardiolite in 12/19 showed inferior infarction, no ischemia.  3. COPD: Has not quit smoking.   4. PAD:  - ABIs 11/07/13: RABI .63 moderate arterial occlusive dz,  LABI .78 moderate arterial occlusive dz - ABIs 9/16: RABI .68, LABI 0.75 - ABIs 12/19: right ABI 0.75, left ABI 0.7 - ABIs 5/21: 0.7 right, 0.69 left.  5. Type 2 diabetes 6. Hyperlipidemia 7. Osteoarthritis: Left THR in 6/20.  8. Gout  9. Hyperlipidemia 10. CKD: Stage 3.  11. Non-small cell lung cancer: Diagnosed 10/20, treated with carboplatin/paclitaxel + radiation.   12. Atrial fibrillation: Paroxysmal.  13. Mitral regurgitation: Suspect ischemic MR with restricted posterior leaflet.  3+ MR on 3/21 TEE.  SH: Lives with his girlfriend.  Quit smoking in 2020. Does not drink alcohol.   FH: 2 brothers died from heart attacks, Mother DM, Dad HTN   ROS: All systems negative except as listed in HPI, PMH and Problem List.  Current Outpatient Medications  Medication Sig Dispense Refill  . acetaminophen (TYLENOL) 325 MG tablet Take 650 mg by mouth every 6 (six) hours as needed for mild pain or moderate pain.     Marland Kitchen albuterol (ACCUNEB) 1.25 MG/3ML nebulizer solution  Take 1 ampule by nebulization every 6 (six) hours as needed for wheezing.    Marland Kitchen albuterol (VENTOLIN HFA) 108 (90 Base) MCG/ACT inhaler Inhale 1-2 puffs into the lungs every 6 (six) hours as needed for wheezing or shortness of breath. 6.7 g 6  . allopurinol (ZYLOPRIM) 300 MG tablet Take 300 mg by mouth daily.    Marland Kitchen amiodarone (PACERONE) 400 MG tablet TAKE 0.5 TABLETS (200 MG TOTAL) BY MOUTH DAILY. 15 tablet 11  . atorvastatin (LIPITOR) 20 MG tablet Take 20 mg by mouth daily.    Marland Kitchen b complex vitamins tablet Take 1 tablet by mouth at bedtime.     . B Complex-C-Folic Acid (RENA-VITE RX) 1 MG TABS Take 1 tablet by mouth daily.    . chlorproMAZINE (THORAZINE) 25 MG tablet Take 1 tablet (25 mg total) by mouth 3 (three) times daily as needed. 30 tablet 0  . Cholecalciferol (VITAMIN D3) 25 MCG (1000 UT) CAPS Take 1,000 Units by mouth daily at 12 noon.     . dapagliflozin propanediol (FARXIGA) 10 MG TABS tablet Take 1 tablet (10 mg total) by mouth daily before breakfast. 30 tablet 11  . ELIQUIS 5 MG TABS tablet Take 5 mg by mouth 2 (two) times daily.     . ferrous gluconate (FERGON) 324 MG tablet Take 1 tablet (324 mg total) by mouth daily with breakfast. 30 tablet 1  . furosemide (LASIX) 40 MG tablet Take 1 tablet (40 mg total) by mouth daily. 30 tablet 5  . guaiFENesin-dextromethorphan (ROBITUSSIN DM) 100-10 MG/5ML syrup Take 5 mLs by mouth every 4 (four) hours as needed for cough.    . Melatonin 3 MG TABS Take 3 mg by mouth at bedtime.    . metoprolol succinate (TOPROL-XL) 50 MG 24 hr tablet Take 50 mg by mouth in the morning and at bedtime.     . nitroGLYCERIN (NITROSTAT) 0.4 MG SL tablet Place 0.4 mg under the tongue every 5 (five) minutes as needed for chest pain.     Marland Kitchen omeprazole (PRILOSEC) 40 MG capsule Take 40 mg by mouth daily.    . potassium chloride SA (KLOR-CON) 20 MEQ tablet Take 1 tablet (20 mEq total) by mouth daily. 2 tablet 0  . sacubitril-valsartan (ENTRESTO) 24-26 MG Take 1 tablet by mouth  2 (two) times daily. 60 tablet 11  . senna (SENOKOT) 8.6 MG TABS tablet Take 1 tablet (8.6 mg total) by mouth 2 (two) times daily. 120 each 0  . SPIRIVA HANDIHALER 18 MCG inhalation capsule Place 1 capsule into inhaler and inhale daily.    Marland Kitchen spironolactone (ALDACTONE) 25 MG tablet Take 25 mg by mouth daily.    . sucralfate (CARAFATE) 1 g tablet Take 1 tablet (1 g total) by mouth 4 (four) times daily -  with meals and at bedtime. 5 min before meals for radiation induced esophagitis 120 tablet 2  . tiZANidine (ZANAFLEX) 4 MG capsule Take 4 mg by mouth 3 (three) times daily as needed for  muscle spasms.      No current facility-administered medications for this encounter.    Vitals:   01/23/20 1155  BP: (!) 82/60  Pulse: (!) 59  SpO2: 100%  Weight: 92.8 kg (204 lb 9.6 oz)  Height: 5\' 7"  (1.702 m)    Wt Readings from Last 3 Encounters:  01/23/20 92.8 kg (204 lb 9.6 oz)  12/28/19 92.4 kg (203 lb 9.6 oz)  12/12/19 93.9 kg (207 lb)    PHYSICAL EXAM: General: NAD Neck: JVP 8-9 cm, no thyromegaly or thyroid nodule.  Lungs: Rhonchi bilaterally CV: Nondisplaced PMI.  Heart regular S1/S2, no S3/S4, no murmur.  No peripheral edema.  No carotid bruit. Unable to palpate pedal pulses.  Abdomen: Soft, nontender, no hepatosplenomegaly, no distention.  Skin: Intact without lesions or rashes.  Neurologic: Alert and oriented x 3.  Psych: Normal affect. Extremities: No clubbing or cyanosis.  HEENT: Normal.   ASSESSMENT & PLAN:  1. CAD s/p CABG: Had cath in 10/16 with patent grafts, medical management.  Cardiolite in 12/19 showed no ischemia (just prior infarction).  No chest pain.  - Continue statin.  - No ASA given Eliquis use.     2. Chronic systolic HF: Ischemic cardiomyopathy, St Jude ICD.  TEE (3/21) with EF 25-30%.  NYHA class III symptoms, weight is up and he appears mildly volume overloaded. BP is low.   - He seems to be taking both Coreg and Toprol XL.  With COPD, he will stop Coreg and  continue Toprol XL.  - Stop hydralazine with low BP (he is not taking Imdur).  - Continue Entresto 24/26 bid.    - Continue spironolactone 25 mg daily.  - Continue dapagliflozin 10 mg daily.  - Increase Lasix to 40 mg daily with BMET today and again in 10 days.   3. COPD/smoking: He has now quit smoking.  4. PAD: Stable claudication and stable ABIs in 5/21. He does not report any claudication.  - Continue statin.   5. CKD: Stage 3.   - BMET today.  6. Atrial fibrillation: Paroxysmal.  He is in NSR today. Had inappropriate ICD shocks in setting of atrial fibrillation with RVR.  - Continue Eliquis.  - Continue amiodarone. Check LFTs and TSH, will need regular eye exam.  7. Lung cancer: NSCLC.  Radiation is completed.    See NP/PA in 6 wks.  Will try to arrange paramedicine.    Loralie Champagne 01/24/2020

## 2020-01-25 DIAGNOSIS — Z79899 Other long term (current) drug therapy: Secondary | ICD-10-CM | POA: Diagnosis not present

## 2020-01-31 ENCOUNTER — Telehealth (HOSPITAL_COMMUNITY): Payer: Self-pay

## 2020-01-31 NOTE — Telephone Encounter (Signed)
Received repeat bmet results.    Creatinine 1.75 Potassium 5.3   Per Brittainy Simmons,PA have patient stop potassium and repeat bmet on Friday 02/02/20. Patient advised and verbalized understanding. Patient will have repeat labs done at pcp office. Lab order faxed to [phone] a copy of this lab result will be scanned into patients chart.

## 2020-02-02 ENCOUNTER — Other Ambulatory Visit (HOSPITAL_COMMUNITY): Payer: Medicare HMO

## 2020-02-02 DIAGNOSIS — Z79899 Other long term (current) drug therapy: Secondary | ICD-10-CM | POA: Diagnosis not present

## 2020-02-06 ENCOUNTER — Telehealth (HOSPITAL_COMMUNITY): Payer: Self-pay | Admitting: *Deleted

## 2020-02-06 DIAGNOSIS — I2510 Atherosclerotic heart disease of native coronary artery without angina pectoris: Secondary | ICD-10-CM | POA: Diagnosis not present

## 2020-02-06 DIAGNOSIS — K219 Gastro-esophageal reflux disease without esophagitis: Secondary | ICD-10-CM | POA: Diagnosis not present

## 2020-02-06 DIAGNOSIS — M25511 Pain in right shoulder: Secondary | ICD-10-CM | POA: Diagnosis not present

## 2020-02-06 DIAGNOSIS — E1140 Type 2 diabetes mellitus with diabetic neuropathy, unspecified: Secondary | ICD-10-CM | POA: Diagnosis not present

## 2020-02-06 DIAGNOSIS — J449 Chronic obstructive pulmonary disease, unspecified: Secondary | ICD-10-CM | POA: Diagnosis not present

## 2020-02-06 DIAGNOSIS — I480 Paroxysmal atrial fibrillation: Secondary | ICD-10-CM | POA: Diagnosis not present

## 2020-02-06 DIAGNOSIS — E785 Hyperlipidemia, unspecified: Secondary | ICD-10-CM | POA: Diagnosis not present

## 2020-02-06 DIAGNOSIS — Z7902 Long term (current) use of antithrombotics/antiplatelets: Secondary | ICD-10-CM | POA: Diagnosis not present

## 2020-02-06 DIAGNOSIS — I5042 Chronic combined systolic (congestive) and diastolic (congestive) heart failure: Secondary | ICD-10-CM | POA: Diagnosis not present

## 2020-02-06 NOTE — Telephone Encounter (Signed)
Labs received via fax.  Potassium 5.3   Per Brittainy Simmons,PA stop spironolactone and repeat bmet 8/19. Pt aware and agreeable with plan. Orders faxed to PCP at [phone] to have labs drawn and results faxed to our office.

## 2020-02-08 ENCOUNTER — Other Ambulatory Visit (HOSPITAL_COMMUNITY): Payer: Self-pay | Admitting: Cardiology

## 2020-02-09 ENCOUNTER — Other Ambulatory Visit (HOSPITAL_COMMUNITY): Payer: Self-pay | Admitting: Cardiology

## 2020-02-09 DIAGNOSIS — N183 Chronic kidney disease, stage 3 unspecified: Secondary | ICD-10-CM | POA: Diagnosis not present

## 2020-02-09 DIAGNOSIS — Z6830 Body mass index (BMI) 30.0-30.9, adult: Secondary | ICD-10-CM | POA: Diagnosis not present

## 2020-02-09 DIAGNOSIS — E875 Hyperkalemia: Secondary | ICD-10-CM | POA: Diagnosis not present

## 2020-02-09 DIAGNOSIS — I509 Heart failure, unspecified: Secondary | ICD-10-CM | POA: Diagnosis not present

## 2020-02-09 DIAGNOSIS — R0789 Other chest pain: Secondary | ICD-10-CM | POA: Diagnosis not present

## 2020-02-13 DIAGNOSIS — E119 Type 2 diabetes mellitus without complications: Secondary | ICD-10-CM | POA: Diagnosis not present

## 2020-02-13 DIAGNOSIS — I509 Heart failure, unspecified: Secondary | ICD-10-CM | POA: Diagnosis not present

## 2020-02-13 DIAGNOSIS — Z6830 Body mass index (BMI) 30.0-30.9, adult: Secondary | ICD-10-CM | POA: Diagnosis not present

## 2020-02-13 DIAGNOSIS — D649 Anemia, unspecified: Secondary | ICD-10-CM | POA: Diagnosis not present

## 2020-02-13 DIAGNOSIS — N183 Chronic kidney disease, stage 3 unspecified: Secondary | ICD-10-CM | POA: Diagnosis not present

## 2020-02-13 DIAGNOSIS — I959 Hypotension, unspecified: Secondary | ICD-10-CM | POA: Diagnosis not present

## 2020-02-13 DIAGNOSIS — C3492 Malignant neoplasm of unspecified part of left bronchus or lung: Secondary | ICD-10-CM | POA: Diagnosis not present

## 2020-02-13 DIAGNOSIS — I2510 Atherosclerotic heart disease of native coronary artery without angina pectoris: Secondary | ICD-10-CM | POA: Diagnosis not present

## 2020-02-20 ENCOUNTER — Other Ambulatory Visit (HOSPITAL_COMMUNITY): Payer: Self-pay | Admitting: Cardiology

## 2020-02-20 DIAGNOSIS — E1140 Type 2 diabetes mellitus with diabetic neuropathy, unspecified: Secondary | ICD-10-CM | POA: Diagnosis not present

## 2020-02-20 DIAGNOSIS — E785 Hyperlipidemia, unspecified: Secondary | ICD-10-CM | POA: Diagnosis not present

## 2020-02-20 DIAGNOSIS — I959 Hypotension, unspecified: Secondary | ICD-10-CM | POA: Diagnosis not present

## 2020-02-20 DIAGNOSIS — K219 Gastro-esophageal reflux disease without esophagitis: Secondary | ICD-10-CM | POA: Diagnosis not present

## 2020-02-20 DIAGNOSIS — I480 Paroxysmal atrial fibrillation: Secondary | ICD-10-CM | POA: Diagnosis not present

## 2020-02-20 DIAGNOSIS — C3492 Malignant neoplasm of unspecified part of left bronchus or lung: Secondary | ICD-10-CM | POA: Diagnosis not present

## 2020-02-20 DIAGNOSIS — N183 Chronic kidney disease, stage 3 unspecified: Secondary | ICD-10-CM | POA: Diagnosis not present

## 2020-02-20 DIAGNOSIS — I5042 Chronic combined systolic (congestive) and diastolic (congestive) heart failure: Secondary | ICD-10-CM | POA: Diagnosis not present

## 2020-02-20 DIAGNOSIS — J449 Chronic obstructive pulmonary disease, unspecified: Secondary | ICD-10-CM | POA: Diagnosis not present

## 2020-02-20 DIAGNOSIS — I509 Heart failure, unspecified: Secondary | ICD-10-CM | POA: Diagnosis not present

## 2020-02-20 DIAGNOSIS — I2510 Atherosclerotic heart disease of native coronary artery without angina pectoris: Secondary | ICD-10-CM | POA: Diagnosis not present

## 2020-02-20 DIAGNOSIS — M25511 Pain in right shoulder: Secondary | ICD-10-CM | POA: Diagnosis not present

## 2020-02-20 DIAGNOSIS — D649 Anemia, unspecified: Secondary | ICD-10-CM | POA: Diagnosis not present

## 2020-02-20 DIAGNOSIS — R0789 Other chest pain: Secondary | ICD-10-CM | POA: Diagnosis not present

## 2020-02-20 DIAGNOSIS — Z7902 Long term (current) use of antithrombotics/antiplatelets: Secondary | ICD-10-CM | POA: Diagnosis not present

## 2020-02-20 DIAGNOSIS — E119 Type 2 diabetes mellitus without complications: Secondary | ICD-10-CM | POA: Diagnosis not present

## 2020-02-20 DIAGNOSIS — Z6829 Body mass index (BMI) 29.0-29.9, adult: Secondary | ICD-10-CM | POA: Diagnosis not present

## 2020-02-21 ENCOUNTER — Telehealth (HOSPITAL_COMMUNITY): Payer: Self-pay

## 2020-02-23 DIAGNOSIS — Z6829 Body mass index (BMI) 29.0-29.9, adult: Secondary | ICD-10-CM | POA: Diagnosis not present

## 2020-02-23 DIAGNOSIS — N183 Chronic kidney disease, stage 3 unspecified: Secondary | ICD-10-CM | POA: Diagnosis not present

## 2020-02-23 DIAGNOSIS — R0789 Other chest pain: Secondary | ICD-10-CM | POA: Diagnosis not present

## 2020-02-23 DIAGNOSIS — C3492 Malignant neoplasm of unspecified part of left bronchus or lung: Secondary | ICD-10-CM | POA: Diagnosis not present

## 2020-02-23 DIAGNOSIS — I959 Hypotension, unspecified: Secondary | ICD-10-CM | POA: Diagnosis not present

## 2020-02-23 DIAGNOSIS — I2510 Atherosclerotic heart disease of native coronary artery without angina pectoris: Secondary | ICD-10-CM | POA: Diagnosis not present

## 2020-02-23 DIAGNOSIS — I509 Heart failure, unspecified: Secondary | ICD-10-CM | POA: Diagnosis not present

## 2020-02-29 DIAGNOSIS — I5042 Chronic combined systolic (congestive) and diastolic (congestive) heart failure: Secondary | ICD-10-CM | POA: Diagnosis not present

## 2020-02-29 DIAGNOSIS — E785 Hyperlipidemia, unspecified: Secondary | ICD-10-CM | POA: Diagnosis not present

## 2020-02-29 DIAGNOSIS — M25511 Pain in right shoulder: Secondary | ICD-10-CM | POA: Diagnosis not present

## 2020-02-29 DIAGNOSIS — J449 Chronic obstructive pulmonary disease, unspecified: Secondary | ICD-10-CM | POA: Diagnosis not present

## 2020-02-29 DIAGNOSIS — K219 Gastro-esophageal reflux disease without esophagitis: Secondary | ICD-10-CM | POA: Diagnosis not present

## 2020-02-29 DIAGNOSIS — I2510 Atherosclerotic heart disease of native coronary artery without angina pectoris: Secondary | ICD-10-CM | POA: Diagnosis not present

## 2020-02-29 DIAGNOSIS — Z7902 Long term (current) use of antithrombotics/antiplatelets: Secondary | ICD-10-CM | POA: Diagnosis not present

## 2020-02-29 DIAGNOSIS — I480 Paroxysmal atrial fibrillation: Secondary | ICD-10-CM | POA: Diagnosis not present

## 2020-02-29 DIAGNOSIS — E1140 Type 2 diabetes mellitus with diabetic neuropathy, unspecified: Secondary | ICD-10-CM | POA: Diagnosis not present

## 2020-03-05 ENCOUNTER — Other Ambulatory Visit: Payer: Self-pay

## 2020-03-05 ENCOUNTER — Encounter (HOSPITAL_COMMUNITY): Payer: Self-pay

## 2020-03-05 ENCOUNTER — Ambulatory Visit (HOSPITAL_COMMUNITY)
Admission: RE | Admit: 2020-03-05 | Discharge: 2020-03-05 | Disposition: A | Discharge status: Home / Self Care | Payer: Medicare HMO | Source: Ambulatory Visit | Attending: Cardiology | Admitting: Cardiology

## 2020-03-05 ENCOUNTER — Telehealth (HOSPITAL_COMMUNITY): Payer: Self-pay | Admitting: Cardiology

## 2020-03-05 VITALS — BP 112/64 | HR 95 | Ht 67.0 in | Wt 191.8 lb

## 2020-03-05 DIAGNOSIS — I480 Paroxysmal atrial fibrillation: Secondary | ICD-10-CM | POA: Diagnosis not present

## 2020-03-05 DIAGNOSIS — Z7984 Long term (current) use of oral hypoglycemic drugs: Secondary | ICD-10-CM | POA: Insufficient documentation

## 2020-03-05 DIAGNOSIS — Z8249 Family history of ischemic heart disease and other diseases of the circulatory system: Secondary | ICD-10-CM | POA: Insufficient documentation

## 2020-03-05 DIAGNOSIS — Z95810 Presence of automatic (implantable) cardiac defibrillator: Secondary | ICD-10-CM | POA: Diagnosis not present

## 2020-03-05 DIAGNOSIS — N183 Chronic kidney disease, stage 3 unspecified: Secondary | ICD-10-CM | POA: Diagnosis not present

## 2020-03-05 DIAGNOSIS — I5022 Chronic systolic (congestive) heart failure: Secondary | ICD-10-CM | POA: Diagnosis not present

## 2020-03-05 DIAGNOSIS — Z96642 Presence of left artificial hip joint: Secondary | ICD-10-CM | POA: Insufficient documentation

## 2020-03-05 DIAGNOSIS — R079 Chest pain, unspecified: Secondary | ICD-10-CM | POA: Diagnosis not present

## 2020-03-05 DIAGNOSIS — E785 Hyperlipidemia, unspecified: Secondary | ICD-10-CM | POA: Diagnosis not present

## 2020-03-05 DIAGNOSIS — Z9221 Personal history of antineoplastic chemotherapy: Secondary | ICD-10-CM | POA: Diagnosis not present

## 2020-03-05 DIAGNOSIS — Z85118 Personal history of other malignant neoplasm of bronchus and lung: Secondary | ICD-10-CM | POA: Insufficient documentation

## 2020-03-05 DIAGNOSIS — I340 Nonrheumatic mitral (valve) insufficiency: Secondary | ICD-10-CM | POA: Insufficient documentation

## 2020-03-05 DIAGNOSIS — R0789 Other chest pain: Secondary | ICD-10-CM | POA: Diagnosis not present

## 2020-03-05 DIAGNOSIS — J449 Chronic obstructive pulmonary disease, unspecified: Secondary | ICD-10-CM | POA: Insufficient documentation

## 2020-03-05 DIAGNOSIS — Z923 Personal history of irradiation: Secondary | ICD-10-CM | POA: Diagnosis not present

## 2020-03-05 DIAGNOSIS — I255 Ischemic cardiomyopathy: Secondary | ICD-10-CM | POA: Diagnosis not present

## 2020-03-05 DIAGNOSIS — Z87891 Personal history of nicotine dependence: Secondary | ICD-10-CM | POA: Insufficient documentation

## 2020-03-05 DIAGNOSIS — M109 Gout, unspecified: Secondary | ICD-10-CM | POA: Diagnosis not present

## 2020-03-05 DIAGNOSIS — I130 Hypertensive heart and chronic kidney disease with heart failure and stage 1 through stage 4 chronic kidney disease, or unspecified chronic kidney disease: Secondary | ICD-10-CM | POA: Insufficient documentation

## 2020-03-05 DIAGNOSIS — Z951 Presence of aortocoronary bypass graft: Secondary | ICD-10-CM | POA: Diagnosis not present

## 2020-03-05 DIAGNOSIS — Z79899 Other long term (current) drug therapy: Secondary | ICD-10-CM | POA: Insufficient documentation

## 2020-03-05 DIAGNOSIS — E1122 Type 2 diabetes mellitus with diabetic chronic kidney disease: Secondary | ICD-10-CM | POA: Diagnosis not present

## 2020-03-05 DIAGNOSIS — I2510 Atherosclerotic heart disease of native coronary artery without angina pectoris: Secondary | ICD-10-CM | POA: Diagnosis not present

## 2020-03-05 DIAGNOSIS — Z7901 Long term (current) use of anticoagulants: Secondary | ICD-10-CM | POA: Insufficient documentation

## 2020-03-05 DIAGNOSIS — E1151 Type 2 diabetes mellitus with diabetic peripheral angiopathy without gangrene: Secondary | ICD-10-CM | POA: Insufficient documentation

## 2020-03-05 LAB — CBC
HCT: 37.3 % — ABNORMAL LOW (ref 39.0–52.0)
Hemoglobin: 11.1 g/dL — ABNORMAL LOW (ref 13.0–17.0)
MCH: 25.9 pg — ABNORMAL LOW (ref 26.0–34.0)
MCHC: 29.8 g/dL — ABNORMAL LOW (ref 30.0–36.0)
MCV: 86.9 fL (ref 80.0–100.0)
Platelets: 224 10*3/uL (ref 150–400)
RBC: 4.29 MIL/uL (ref 4.22–5.81)
RDW: 17.1 % — ABNORMAL HIGH (ref 11.5–15.5)
WBC: 8.4 10*3/uL (ref 4.0–10.5)
nRBC: 0 % (ref 0.0–0.2)

## 2020-03-05 LAB — COMPREHENSIVE METABOLIC PANEL
ALT: 18 U/L (ref 0–44)
AST: 26 U/L (ref 15–41)
Albumin: 3.2 g/dL — ABNORMAL LOW (ref 3.5–5.0)
Alkaline Phosphatase: 66 U/L (ref 38–126)
Anion gap: 9 (ref 5–15)
BUN: 19 mg/dL (ref 8–23)
CO2: 24 mmol/L (ref 22–32)
Calcium: 9.6 mg/dL (ref 8.9–10.3)
Chloride: 103 mmol/L (ref 98–111)
Creatinine, Ser: 1.64 mg/dL — ABNORMAL HIGH (ref 0.61–1.24)
GFR calc Af Amer: 49 mL/min — ABNORMAL LOW (ref >60)
GFR calc non Af Amer: 42 mL/min — ABNORMAL LOW (ref >60)
Glucose, Bld: 93 mg/dL (ref 70–99)
Potassium: 3.7 mmol/L (ref 3.5–5.1)
Sodium: 136 mmol/L (ref 135–145)
Total Bilirubin: 0.3 mg/dL (ref 0.3–1.2)
Total Protein: 7 g/dL (ref 6.5–8.1)

## 2020-03-05 LAB — BRAIN NATRIURETIC PEPTIDE: B Natriuretic Peptide: 285 pg/mL — ABNORMAL HIGH (ref 0.0–100.0)

## 2020-03-05 MED ORDER — FUROSEMIDE 20 MG PO TABS: 40.0000 mg | ORAL_TABLET | Freq: Every day | ORAL | 11 refills | Status: DC

## 2020-03-05 NOTE — Telephone Encounter (Signed)
Pt and patient [name] [name] aware and voiced understanding Repeat labs 9/22

## 2020-03-05 NOTE — Progress Notes (Signed)
CSW consulted for possible paramedicine referral due to concerns with pt medication compliance at home.  CSW spoke with pt who confirms he has home aid services and home RN- CSW able to look back in notes and find contact information for home health RNLars [name]- [phone]  CSW spoke with [name] [name] who states she normally sees pt 2x week and assists with medications- states medication confusion likely because pt PCP stopped several medications during visit a few weeks ago- CSW provided clinic APP with list of med changes so we could update in our system.  Pt reports no further concerns at this time- CSW will continue to follow and fax list of any med changes that we make during clinic visit.  [name] Ny, LCSW Clinical Social Worker [facility] Desk#: [phone] Cell#: [phone]

## 2020-03-05 NOTE — Progress Notes (Signed)
Patient ID: [name] [name]., male   DOB: 1950-12-04, 69 y.o.   MRN: [id number] PCP: [name] [facility]) Cardiac Surgeon: Dr Roxan Hockey Pulmonologist: Dr. Lamonte Sakai Cardiology: Dr. Aundra Dubin  HPI: [name] is a 69 y.o. with history of COPD, HTN, 3V CAD s/p CABG x 5  (CABG 04/2013: LIMA to LAD, SVG to second diagonal, SVG to ramus intermediate and obtuse marginal 2, SVG to posterior descending), ischemic cardiomyopathy, chronic systolic HF, COPD, DM2, PVD and tobacco abuse. St Jude ICD.   Lexiscan Cardiolite in 12/19 showed inferior infarct, no ischemia.  Echo in 12/19 showed EF 35%, mild LVH, mildly decreased RV systolic function.  ABIs in 12/19 were stable compared to the past.    In the fall of 2020, he was diagnosed with non-small cell lung cancer.  He has been treated with radiation and carboplatin/palclitaxel.  He has lost over 20 lbs.  He was admitted 1/21 at [facility] with ICD shocks.  These were found to be inappropriate shocks probably due to atrial fibrillation.  He is now on amiodarone and Eliquis.    TEE in 3/21 was done to assess mitral regurgitation, showing EF 25-30%, mildly decreased RV systolic function, 3+ likely ischemic MR.  In 5/21, he had symptomatic anemia.  EGD/colonoscopy done, no definite cause. ABIs in 5/21 were stable.   He was recently seen by Dr. Aundra Dubin 8/4 and was volume overloaded. Wt was up 6 lb but his BP was low in the 84Y systolic. It appeared he had been taking both coreg and Toprol XL. Given his COPD, he was advised to stop Coreg and to continue Toprol XL. Hydralazine was also discontinued. Lasix was increased to 40 mg daily. Plan was to refer to paramedicine, but did not qualify as he lives in [place]). He has a home health aid that assist around his home.  Per his report, it sounds like she assists him with meds.   He returns again for f/u. His wt is down 13 lb from 204>>191 lb today. Hypotension resolved. BP 112/64 today. Upon  investigation, it appears his PCP recently stopped most of his HF meds due to worsening renal function and hyperkalemia. SW assisted with visit today and confirms that he was taken off Lasix, Farxiga, spironolactone, amlodipine, metoprolol and Entresto.   He reports that he overall feels better. Dyspnea resolved. He does complain of intermitted left sided chest pain, unsure if similar to pre-cabg angina (cannot recall exact symptoms at that time). Pattern and characteristics however seem more atypical, described as sharp in nature, not worsen by exertion and exacerbated when he tries sleeping on his left shoulder. The pain does radiate to his back and not associated w/ meals. His EKG however does show inferolateral ST changes. No active CP currently.    ECG (personally reviewed): NSR w/ LAD, RBBB, LVH and inferolateral ST changes   Labs (11/19): K 4.6, creatinine 2.05 Labs(12/19): K 4.3, creatinine 2.07, LDL 57  Labs (9/20): K 4.2, creatinine 2.09, LDL 50 Labs (2/21): TSH normal, K 3.9, creatinine 1.92, AST 56, ALT 176 Labs (3/21): K 3.4, creatinine 1.57, hgb 10.7, LFTs normal Labs (6/21): K 4.1, creatinine 1.81, hgb 9.1, LFTs normal.  Labs (8/321): K 4.1, creatinine 2.11, TSH normal, AST 52, ALT 65  Labs (01/25/20): K 5.1, creatinine 1.75  Labs (02/02/20): K 5.3, creatinine 2.51 Labs (02/09/20): K 5.1, creatinine 2.27     PMH: 1. Chronic systolic CHF: Ischemic cardiomyopathy.  St Jude ICD.  - ECHO 06/26/13 EF  20-25% RV mild HK - ECHO 10/10/13 EF 20-25% restrictive filling pattern. Moderate RV dysfunction. Mild MR. - Cardiolite (10/16) with EF 19%, anterior ischemia.  - Echo 7/17 EF 30-35% - Echo (12/19): EF 35%, mild LVH, basal-mid inferior akinesis, mildly decreased RV systolic function, mild MR.  - TEE (3/21): EF 25-30%, mild LV dilation, mildly decreased RV systolic function, 3+ MR with restricted posterior leaflet, PISA ERO 0.22 cm^2.  2. CAD: CABG x 5 11/14 with LIMA-LAD, SVG-D2, seq  SVG-ramus/OM2, SVG-PDA.  - LHC (10/16) with patent LIMA-LAD and 60% pLAD stenosis (competitive flow). SVG-D, sequential SVG-ramus/OM2, SVG-PDA all patent. Medical management.  - Lexiscan Cardiolite in 12/19 showed inferior infarction, no ischemia.  3. COPD: Has not quit smoking.   4. PAD:  - ABIs 11/07/13: RABI .63 moderate arterial occlusive dz,  LABI .78 moderate arterial occlusive dz - ABIs 9/16: RABI .68, LABI 0.75 - ABIs 12/19: right ABI 0.75, left ABI 0.7 - ABIs 5/21: 0.7 right, 0.69 left.  5. Type 2 diabetes 6. Hyperlipidemia 7. Osteoarthritis: Left THR in 6/20.  8. Gout  9. Hyperlipidemia 10. CKD: Stage 3.  11. Non-small cell lung cancer: Diagnosed 10/20, treated with carboplatin/paclitaxel + radiation.   12. Atrial fibrillation: Paroxysmal.  13. Mitral regurgitation: Suspect ischemic MR with restricted posterior leaflet.  3+ MR on 3/21 TEE.   SH: Lives with his girlfriend.  Quit smoking in 2020. Does not drink alcohol.   FH: 2 brothers died from heart attacks, Mother DM, Dad HTN   ROS: All systems negative except as listed in HPI, PMH and Problem List.  Current Outpatient Medications  Medication Sig Dispense Refill  . albuterol (ACCUNEB) 1.25 MG/3ML nebulizer solution Take 1 ampule by nebulization every 6 (six) hours as needed for wheezing.    Marland Kitchen albuterol (VENTOLIN HFA) 108 (90 Base) MCG/ACT inhaler Inhale 1-2 puffs into the lungs every 6 (six) hours as needed for wheezing or shortness of breath. 6.7 g 6  . allopurinol (ZYLOPRIM) 300 MG tablet Take 300 mg by mouth daily.    Marland Kitchen amiodarone (PACERONE) 400 MG tablet TAKE 0.5 TABLETS (200 MG TOTAL) BY MOUTH DAILY. 15 tablet 11  . atorvastatin (LIPITOR) 20 MG tablet Take 20 mg by mouth daily.    Marland Kitchen b complex vitamins tablet Take 1 tablet by mouth at bedtime.     . B Complex-C-Folic Acid (RENA-VITE RX) 1 MG TABS Take 1 tablet by mouth daily.    . Cholecalciferol (VITAMIN D3) 25 MCG (1000 UT) CAPS Take 1,000 Units by mouth daily at 12  noon.     . dapagliflozin propanediol (FARXIGA) 10 MG TABS tablet Take 1 tablet (10 mg total) by mouth daily before breakfast. 30 tablet 11  . ELIQUIS 5 MG TABS tablet Take 5 mg by mouth 2 (two) times daily.     . ferrous gluconate (FERGON) 324 MG tablet Take 1 tablet (324 mg total) by mouth daily with breakfast. 30 tablet 1  . furosemide (LASIX) 40 MG tablet Take 1 tablet (40 mg total) by mouth daily. 30 tablet 5  . Melatonin 3 MG TABS Take 3 mg by mouth at bedtime.    . metoprolol succinate (TOPROL-XL) 50 MG 24 hr tablet Take 50 mg by mouth in the morning and at bedtime.     . nitroGLYCERIN (NITROSTAT) 0.4 MG SL tablet Place 0.4 mg under the tongue every 5 (five) minutes as needed for chest pain.     Marland Kitchen omeprazole (PRILOSEC) 40 MG capsule Take 40 mg by  mouth daily.    . potassium chloride SA (KLOR-CON) 20 MEQ tablet Take 1 tablet (20 mEq total) by mouth daily. 2 tablet 0  . sacubitril-valsartan (ENTRESTO) 24-26 MG Take 1 tablet by mouth 2 (two) times daily. 60 tablet 11  . senna (SENOKOT) 8.6 MG TABS tablet Take 1 tablet (8.6 mg total) by mouth 2 (two) times daily. 120 each 0  . SPIRIVA HANDIHALER 18 MCG inhalation capsule Place 1 capsule into inhaler and inhale daily.    Marland Kitchen spironolactone (ALDACTONE) 25 MG tablet Take 25 mg by mouth daily.    . sucralfate (CARAFATE) 1 g tablet Take 1 tablet (1 g total) by mouth 4 (four) times daily -  with meals and at bedtime. 5 min before meals for radiation induced esophagitis 120 tablet 2  . tiZANidine (ZANAFLEX) 4 MG capsule Take 4 mg by mouth 3 (three) times daily as needed for muscle spasms.     Marland Kitchen acetaminophen (TYLENOL) 325 MG tablet Take 650 mg by mouth every 6 (six) hours as needed for mild pain or moderate pain.  (Patient not taking: Reported on 03/05/2020)    . chlorproMAZINE (THORAZINE) 25 MG tablet Take 1 tablet (25 mg total) by mouth 3 (three) times daily as needed. 30 tablet 0  . guaiFENesin-dextromethorphan (ROBITUSSIN DM) 100-10 MG/5ML syrup Take  5 mLs by mouth every 4 (four) hours as needed for cough. (Patient not taking: Reported on 03/05/2020)     No current facility-administered medications for this encounter.    Vitals:   03/05/20 1120  BP: 112/64  Pulse: 95  SpO2: 100%  Weight: 87 kg (191 lb 12.8 oz)  Height: 5\' 7"  (1.702 m)    Wt Readings from Last 3 Encounters:  03/05/20 87 kg (191 lb 12.8 oz)  01/23/20 92.8 kg (204 lb 9.6 oz)  12/28/19 92.4 kg (203 lb 9.6 oz)     General:  disheveled looking but no acute distress. No respiratory difficulty HEENT: normal Neck: supple. no JVD. Carotids 2+ bilat; no bruits. No lymphadenopathy or thyromegaly appreciated. Cor: PMI nondisplaced. Regular rate & rhythm. No rubs, gallops or murmurs. Lungs: clear Abdomen: soft, nontender, nondistended. No hepatosplenomegaly. No bruits or masses. Good bowel sounds. Extremities: no cyanosis, clubbing, rash, edema Neuro: alert & oriented x 3, cranial nerves grossly intact. moves all 4 extremities w/o difficulty. Affect pleasant.   ASSESSMENT & PLAN:  1. CAD s/p CABG: Had cath in 10/16 with patent grafts, medical management.  Cardiolite in 12/19 showed no ischemia (just prior infarction).  He has had recent chest pain w/ mostly atypical features but EKG w/ new inferolateral ST changes. No active CP currently  - arrange NST to r/o ischemia  - Continue statin.  - No ASA given Eliquis use.     2. Chronic systolic HF: Ischemic cardiomyopathy, St Jude ICD.  TEE (3/21) with EF 25-30%.  - recently required diuretic increased for volume overload. Wt now down 13 lb. Functional status improved, NYHA II-III.  - it appears he was recently taken off most of his HF meds by PCP given worsening SCr and hyperkalemia. Now off Delene Loll, Farxiga, Arlyce Harman, Lasix and metoprolol. Hydralazine was discontinued by Dr. Aundra Dubin prior to this for hypotension.  - his volume status appears ok on exam today and BP is stable.  - plan to repeat BMP and BNP today - will try to  restart some of his HF meds, pending renal function and BP. Will arrange f/u w/ pharmD again in 2-3 weeks to reassess BP  and labs.   3. COPD/smoking: He has now quit smoking.  4. PAD: stable ABIs in 5/21. He denies claudication.  - Continue statin.   - no longer smoking - no ASA w/ eliquis use  5. CKD: Stage 3.   - check BMP today  6. Atrial fibrillation: Paroxysmal.  He is in NSR today by EKG. Had inappropriate ICD shocks in setting of atrial fibrillation with RVR.  - Continue Eliquis. Check CBC today  - Continue amiodarone. TSH was normal at last visit, HFTs were mildly elevated. Will repeat HFTs again today. He will need regular eye exam.  7. Lung cancer: NSCLC.  Radiation is completed.    - f/u w/ pharmD in 2-3 weeks, try to add back some of his HF meds based if renal function and BP allows   Lyda Jester, PA-C 03/05/2020

## 2020-03-05 NOTE — Patient Instructions (Signed)
Labs today We will only contact you if something comes back abnormal or we need to make some changes. Otherwise no news is good news!  Your physician has requested that you have a lexiscan myoview. For further information please visit [email or web address]. Please follow instruction sheet, as given.  Your physician recommends that you schedule a follow-up appointment in: pharmacy team in 3-4 weeks       How to Prepare for Your Myoview Test (stress test):  1. Please do not take these medications before your test: Metoprolol  (please note if this is an exercise test pt should hold beta blocker prior) 2. Your remaining medications may be taken with water. 3. Nothing to eat or drink, except water, 4 hours prior to arrival time.  NO caffeine/decaffeinated products, or chocolate 12 hours prior to arrival. 4. Ladies, please do not wear dresses.  Skirts or pants are approprate, please wear a short sleeve shirt. 5. NO perfume, cologne or lotion 6. Wear comfortable walking shoes.  NO HEELS! 7. Total time is 3 to 4 hours; you may want to bring reading material for the waiting time. 8. Please report to [facility] for your test  What to expect after you arrive:  Once you arrive and check in for your appointment an IV will be started in your arm.  Then the Technoligist will inject a small amount of radioactive tracer.  There will be a 1 hour waiting period after this injection.  A series of pictures will be taken of your heart following this waiting period.  You will be prepped for the stress portion of the test.  During the stress portion of your test you will either walk on a treadmill or receive a small, safe amount of radioactive tracer injected in your IV.  After the stress portion, there is a short rest period during which time your heart and blood pressure will be monitored.  After the short rest period the Technologist will begin your second set of pictures.  Your doctor will inform you of your  test results within 7-10 business days.  In preparation for your appointment, medication and supplies will be purchased.  Appointment availability is limited, so if you need to cancel or reschedule please call the office at [phone] 24 hours in advance to avoid a cancellation fee of $100.00  [place], [employer] TECHNOLOGIST.

## 2020-03-05 NOTE — Telephone Encounter (Signed)
-----   Message from Consuelo Pandy, [place] sent at 03/05/2020  4:47 PM EDT ----- Creatinine improved but BNP mildly elevated. Recommend that he restart Lasix 40 mg daily + KCl 20 mEq daily. Repeat BMP in 1 week. Keep upcomming f/u w/ PharmD.

## 2020-03-06 NOTE — Addendum Note (Signed)
Encounter addended by: Harvie Junior, CMA on: 03/06/2020 2:27 PM  Actions taken: Charge Capture section accepted

## 2020-03-08 ENCOUNTER — Ambulatory Visit (HOSPITAL_COMMUNITY): Payer: Medicare HMO | Attending: Internal Medicine

## 2020-03-08 ENCOUNTER — Other Ambulatory Visit: Payer: Self-pay

## 2020-03-08 VITALS — Ht 67.0 in | Wt 191.0 lb

## 2020-03-08 DIAGNOSIS — R110 Nausea: Secondary | ICD-10-CM | POA: Diagnosis not present

## 2020-03-08 DIAGNOSIS — R079 Chest pain, unspecified: Secondary | ICD-10-CM | POA: Diagnosis not present

## 2020-03-08 LAB — MYOCARDIAL PERFUSION IMAGING
LV dias vol: 245 mL (ref 62–150)
LV sys vol: 194 mL
Peak HR: 88 {beats}/min
Rest HR: 81 {beats}/min
SDS: 3
SRS: 9
SSS: 12
TID: 1.03

## 2020-03-08 MED ORDER — AMINOPHYLLINE 25 MG/ML IV SOLN
75.0000 mg | Freq: Once | INTRAVENOUS | Status: AC
  Administered 2020-03-08: 75 mg via INTRAVENOUS

## 2020-03-08 MED ORDER — TECHNETIUM TC 99M TETROFOSMIN IV KIT
10.7000 | PACK | Freq: Once | INTRAVENOUS | Status: AC | PRN
  Administered 2020-03-08: 10.7 via INTRAVENOUS
  Filled 2020-03-08: qty 11

## 2020-03-08 MED ORDER — TECHNETIUM TC 99M TETROFOSMIN IV KIT
32.6000 | PACK | Freq: Once | INTRAVENOUS | Status: AC | PRN
  Administered 2020-03-08: 32.6 via INTRAVENOUS
  Filled 2020-03-08: qty 33

## 2020-03-08 MED ORDER — REGADENOSON 0.4 MG/5ML IV SOLN
0.4000 mg | Freq: Once | INTRAVENOUS | Status: AC
  Administered 2020-03-08: 0.4 mg via INTRAVENOUS

## 2020-03-11 ENCOUNTER — Other Ambulatory Visit: Payer: Self-pay

## 2020-03-11 ENCOUNTER — Inpatient Hospital Stay: Payer: Medicare HMO | Attending: Internal Medicine

## 2020-03-11 ENCOUNTER — Ambulatory Visit (HOSPITAL_COMMUNITY)
Admission: RE | Admit: 2020-03-11 | Discharge: 2020-03-11 | Disposition: A | Discharge status: Home / Self Care | Payer: Medicare HMO | Source: Ambulatory Visit | Attending: Internal Medicine | Admitting: Internal Medicine

## 2020-03-11 ENCOUNTER — Other Ambulatory Visit: Payer: Medicare HMO

## 2020-03-11 DIAGNOSIS — Z7901 Long term (current) use of anticoagulants: Secondary | ICD-10-CM | POA: Insufficient documentation

## 2020-03-11 DIAGNOSIS — E785 Hyperlipidemia, unspecified: Secondary | ICD-10-CM | POA: Diagnosis not present

## 2020-03-11 DIAGNOSIS — Z95810 Presence of automatic (implantable) cardiac defibrillator: Secondary | ICD-10-CM | POA: Insufficient documentation

## 2020-03-11 DIAGNOSIS — J8410 Pulmonary fibrosis, unspecified: Secondary | ICD-10-CM | POA: Diagnosis not present

## 2020-03-11 DIAGNOSIS — I5082 Biventricular heart failure: Secondary | ICD-10-CM | POA: Insufficient documentation

## 2020-03-11 DIAGNOSIS — C3490 Malignant neoplasm of unspecified part of unspecified bronchus or lung: Secondary | ICD-10-CM

## 2020-03-11 DIAGNOSIS — K219 Gastro-esophageal reflux disease without esophagitis: Secondary | ICD-10-CM | POA: Diagnosis not present

## 2020-03-11 DIAGNOSIS — C3412 Malignant neoplasm of upper lobe, left bronchus or lung: Secondary | ICD-10-CM | POA: Diagnosis not present

## 2020-03-11 DIAGNOSIS — J181 Lobar pneumonia, unspecified organism: Secondary | ICD-10-CM | POA: Diagnosis not present

## 2020-03-11 DIAGNOSIS — Z923 Personal history of irradiation: Secondary | ICD-10-CM | POA: Insufficient documentation

## 2020-03-11 DIAGNOSIS — I5022 Chronic systolic (congestive) heart failure: Secondary | ICD-10-CM | POA: Insufficient documentation

## 2020-03-11 DIAGNOSIS — I255 Ischemic cardiomyopathy: Secondary | ICD-10-CM | POA: Insufficient documentation

## 2020-03-11 DIAGNOSIS — Z79899 Other long term (current) drug therapy: Secondary | ICD-10-CM | POA: Diagnosis not present

## 2020-03-11 DIAGNOSIS — J449 Chronic obstructive pulmonary disease, unspecified: Secondary | ICD-10-CM | POA: Diagnosis not present

## 2020-03-11 DIAGNOSIS — I2510 Atherosclerotic heart disease of native coronary artery without angina pectoris: Secondary | ICD-10-CM | POA: Diagnosis not present

## 2020-03-11 DIAGNOSIS — I700 Atherosclerosis of aorta: Secondary | ICD-10-CM | POA: Diagnosis not present

## 2020-03-11 DIAGNOSIS — M109 Gout, unspecified: Secondary | ICD-10-CM | POA: Insufficient documentation

## 2020-03-11 DIAGNOSIS — I110 Hypertensive heart disease with heart failure: Secondary | ICD-10-CM | POA: Insufficient documentation

## 2020-03-11 DIAGNOSIS — E1136 Type 2 diabetes mellitus with diabetic cataract: Secondary | ICD-10-CM | POA: Insufficient documentation

## 2020-03-11 LAB — CMP (CANCER CENTER ONLY)
ALT: 16 U/L (ref 0–44)
AST: 17 U/L (ref 15–41)
Albumin: 3 g/dL — ABNORMAL LOW (ref 3.5–5.0)
Alkaline Phosphatase: 79 U/L (ref 38–126)
Anion gap: 8 (ref 5–15)
BUN: 14 mg/dL (ref 8–23)
CO2: 29 mmol/L (ref 22–32)
Calcium: 9.7 mg/dL (ref 8.9–10.3)
Chloride: 102 mmol/L (ref 98–111)
Creatinine: 1.36 mg/dL — ABNORMAL HIGH (ref 0.61–1.24)
GFR, Est AFR Am: >60 mL/min (ref >60)
GFR, Estimated: 53 mL/min — ABNORMAL LOW (ref >60)
Glucose, Bld: 90 mg/dL (ref 70–99)
Potassium: 3.2 mmol/L — ABNORMAL LOW (ref 3.5–5.1)
Sodium: 139 mmol/L (ref 135–145)
Total Bilirubin: 0.4 mg/dL (ref 0.3–1.2)
Total Protein: 7.3 g/dL (ref 6.5–8.1)

## 2020-03-11 LAB — CBC WITH DIFFERENTIAL (CANCER CENTER ONLY)
Abs Immature Granulocytes: 0.01 10*3/uL (ref 0.00–0.07)
Basophils Absolute: 0 10*3/uL (ref 0.0–0.1)
Basophils Relative: 1 %
Eosinophils Absolute: 0.2 10*3/uL (ref 0.0–0.5)
Eosinophils Relative: 2 %
HCT: 35.8 % — ABNORMAL LOW (ref 39.0–52.0)
Hemoglobin: 11 g/dL — ABNORMAL LOW (ref 13.0–17.0)
Immature Granulocytes: 0 %
Lymphocytes Relative: 11 %
Lymphs Abs: 0.8 10*3/uL (ref 0.7–4.0)
MCH: 25.9 pg — ABNORMAL LOW (ref 26.0–34.0)
MCHC: 30.7 g/dL (ref 30.0–36.0)
MCV: 84.2 fL (ref 80.0–100.0)
Monocytes Absolute: 0.7 10*3/uL (ref 0.1–1.0)
Monocytes Relative: 10 %
Neutro Abs: 5.7 10*3/uL (ref 1.7–7.7)
Neutrophils Relative %: 76 %
Platelet Count: 228 10*3/uL (ref 150–400)
RBC: 4.25 MIL/uL (ref 4.22–5.81)
RDW: 17.1 % — ABNORMAL HIGH (ref 11.5–15.5)
WBC Count: 7.4 10*3/uL (ref 4.0–10.5)
nRBC: 0 % (ref 0.0–0.2)

## 2020-03-12 ENCOUNTER — Telehealth: Payer: Self-pay

## 2020-03-12 NOTE — Telephone Encounter (Signed)
I have left a message for pt requesting a return call. [employer] need to be reviewed with the pt.

## 2020-03-13 ENCOUNTER — Encounter: Payer: Self-pay | Admitting: Internal Medicine

## 2020-03-13 ENCOUNTER — Inpatient Hospital Stay (HOSPITAL_BASED_OUTPATIENT_CLINIC_OR_DEPARTMENT_OTHER): Payer: Medicare HMO | Admitting: Internal Medicine

## 2020-03-13 ENCOUNTER — Other Ambulatory Visit (HOSPITAL_COMMUNITY): Payer: Medicare HMO

## 2020-03-13 ENCOUNTER — Other Ambulatory Visit: Payer: Self-pay

## 2020-03-13 VITALS — BP 112/70 | HR 85 | Temp 97.3°F | Resp 18 | Ht 67.0 in | Wt 193.0 lb

## 2020-03-13 DIAGNOSIS — C3492 Malignant neoplasm of unspecified part of left bronchus or lung: Secondary | ICD-10-CM

## 2020-03-13 DIAGNOSIS — E785 Hyperlipidemia, unspecified: Secondary | ICD-10-CM | POA: Diagnosis not present

## 2020-03-13 DIAGNOSIS — I5022 Chronic systolic (congestive) heart failure: Secondary | ICD-10-CM | POA: Diagnosis not present

## 2020-03-13 DIAGNOSIS — E1136 Type 2 diabetes mellitus with diabetic cataract: Secondary | ICD-10-CM | POA: Diagnosis not present

## 2020-03-13 DIAGNOSIS — C3490 Malignant neoplasm of unspecified part of unspecified bronchus or lung: Secondary | ICD-10-CM

## 2020-03-13 DIAGNOSIS — C3412 Malignant neoplasm of upper lobe, left bronchus or lung: Secondary | ICD-10-CM | POA: Diagnosis not present

## 2020-03-13 DIAGNOSIS — I2510 Atherosclerotic heart disease of native coronary artery without angina pectoris: Secondary | ICD-10-CM | POA: Diagnosis not present

## 2020-03-13 DIAGNOSIS — I5082 Biventricular heart failure: Secondary | ICD-10-CM | POA: Diagnosis not present

## 2020-03-13 DIAGNOSIS — Z923 Personal history of irradiation: Secondary | ICD-10-CM | POA: Diagnosis not present

## 2020-03-13 DIAGNOSIS — I110 Hypertensive heart disease with heart failure: Secondary | ICD-10-CM | POA: Diagnosis not present

## 2020-03-13 DIAGNOSIS — J449 Chronic obstructive pulmonary disease, unspecified: Secondary | ICD-10-CM | POA: Diagnosis not present

## 2020-03-13 NOTE — Progress Notes (Signed)
[name] [phone]   [phone]  OFFICE PROGRESS NOTE  [name], PA-C [place] [address]  DIAGNOSIS: stage IIIc (T2 a, N3, M0) non-small cell lung cancer, squamous cell carcinoma diagnosed in October 2020 and presented with left upper lobe pleural-based mass in addition to left hilar and right paratracheal lymphadenopathy.  PRIOR THERAPY:Concurrent chemoradiation with weekly carboplatin for AUC of 2 and paclitaxel 45 mg/M2.  First dose was on May 08, 2019.  Status post 7 cycles.  He declined consolidation immunotherapy.  . CURRENT THERAPY: Observation.  INTERVAL HISTORY: [name] [name]. 69 y.o. male returns to the clinic today for follow-up visit.  The patient is feeling fine today with no concerning complaints except for intermittent pain on the left side of the chest at the area of the previous radiation.  He also has a history of congestive heart failure and he is seeing his cardiologist tomorrow.  He denied having any shortness of breath but has mild cough with no hemoptysis.  He denied having any fever or chills.  He has no nausea, vomiting, diarrhea or constipation.  He denied having any weight loss or night sweats.  The patient had repeat CT scan of the chest performed recently and is here for evaluation and discussion of his scan results.   MEDICAL HISTORY: Past Medical History:  Diagnosis Date  . AICD (automatic cardioverter/defibrillator) present    reports no shocks  - st Jude  . Ambulates with cane   . CAD (coronary artery disease)    a. 04/2013 Cath: LM 10, LAD 40p, 44m, D1 50p, LCX 60p, 81m/100m, OM1 50, OM2 100 L-L collats, RCA 100p;  b. CABG x 5: LIMA->LAD, VG->D2, VG->RI->OM2, VG->PDA.  . Cataracts, bilateral   . Chronic systolic CHF (congestive heart failure) ([place])    a. 04/2013 Echo: EF 15-20%, diff HK, sev HK of inf and ant myocardium, dilated LA,  b. EF 20-25% with restrictive filling pattern,  mod RV dysfx, mild MR (10/10/2013);  c.  Echo 12/15: EF 25-30%  . COPD (chronic obstructive pulmonary disease) ([place])   . Diabetes mellitus without complication ([place])    unsure what type but was dx as an adult, takes no meds, check his cbg at home 2 times a week   . Emphysema   . GERD (gastroesophageal reflux disease)   . Gout   . Hyperlipidemia   . Hypertension   . Ischemic cardiomyopathy   . lung ca   . PVD (peripheral vascular disease) ([place])    a. (10/2013) ABIs: RIGHT 0.63, Waveforms: monophasic;  LEFT 0.78, Waveforms: monophasic  . Tobacco abuse    30+ pack-year history  . Transaminitis    a. 04/2013 Abd U/S and CT unremarkable, hepatitis panel neg -->felt to be 2/2 acute R heart failure.    ALLERGIES:  is allergic to penicillins.  MEDICATIONS:  Current Outpatient Medications  Medication Sig Dispense Refill  . acetaminophen (TYLENOL) 325 MG tablet Take 650 mg by mouth every 6 (six) hours as needed for mild pain or moderate pain.  (Patient not taking: Reported on 03/05/2020)    . albuterol (ACCUNEB) 1.25 MG/3ML nebulizer solution Take 1 ampule by nebulization every 6 (six) hours as needed for wheezing.    Marland Kitchen albuterol (VENTOLIN HFA) 108 (90 Base) MCG/ACT inhaler Inhale 1-2 puffs into the lungs every 6 (six) hours as needed for wheezing or shortness of breath. 6.7 g 6  . allopurinol (ZYLOPRIM) 300 MG  tablet Take 300 mg by mouth daily.    Marland Kitchen atorvastatin (LIPITOR) 20 MG tablet Take 20 mg by mouth daily.    Marland Kitchen b complex vitamins tablet Take 1 tablet by mouth at bedtime.     . B Complex-C-Folic Acid (RENA-VITE RX) 1 MG TABS Take 1 tablet by mouth daily.    . chlorproMAZINE (THORAZINE) 25 MG tablet Take 1 tablet (25 mg total) by mouth 3 (three) times daily as needed. 30 tablet 0  . Cholecalciferol (VITAMIN D3) 25 MCG (1000 UT) CAPS Take 1,000 Units by mouth daily at 12 noon.     Marland Kitchen ELIQUIS 5 MG TABS tablet Take 5 mg by mouth 2 (two) times daily.     . ferrous gluconate (FERGON) 324 MG tablet  Take 1 tablet (324 mg total) by mouth daily with breakfast. 30 tablet 1  . furosemide (LASIX) 20 MG tablet Take 2 tablets (40 mg total) by mouth daily. 60 tablet 11  . guaiFENesin-dextromethorphan (ROBITUSSIN DM) 100-10 MG/5ML syrup Take 5 mLs by mouth every 4 (four) hours as needed for cough. (Patient not taking: Reported on 03/05/2020)    . Melatonin 3 MG TABS Take 3 mg by mouth at bedtime.    . nitroGLYCERIN (NITROSTAT) 0.4 MG SL tablet Place 0.4 mg under the tongue every 5 (five) minutes as needed for chest pain.     Marland Kitchen omeprazole (PRILOSEC) 40 MG capsule Take 40 mg by mouth daily.    . potassium chloride SA (KLOR-CON) 20 MEQ tablet Take 1 tablet (20 mEq total) by mouth daily. 2 tablet 0  . senna (SENOKOT) 8.6 MG TABS tablet Take 1 tablet (8.6 mg total) by mouth 2 (two) times daily. 120 each 0  . SPIRIVA HANDIHALER 18 MCG inhalation capsule Place 1 capsule into inhaler and inhale daily.    . sucralfate (CARAFATE) 1 g tablet Take 1 tablet (1 g total) by mouth 4 (four) times daily -  with meals and at bedtime. 5 min before meals for radiation induced esophagitis 120 tablet 2  . tiZANidine (ZANAFLEX) 4 MG capsule Take 4 mg by mouth 3 (three) times daily as needed for muscle spasms.      No current facility-administered medications for this visit.    SURGICAL HISTORY:  Past Surgical History:  Procedure Laterality Date  . APPENDECTOMY  as a child  . BIOPSY  11/16/2019   Procedure: BIOPSY;  Surgeon: Rush Landmark Telford Nab., MD;  Location: [facility];  Service: Gastroenterology;;  . CARDIAC CATHETERIZATION  04/08/2015   Procedure: Left Heart Cath and Cors/Grafts Angiography;  Surgeon: Peter M Martinique, MD;  Location: [facility];  Service: Cardiovascular;;  . COLONOSCOPY    . COLONOSCOPY WITH PROPOFOL N/A 11/16/2019   Procedure: COLONOSCOPY WITH PROPOFOL;  Surgeon: Rush Landmark Telford Nab., MD;  Location: [facility];  Service: Gastroenterology;  Laterality: N/A;  . CORONARY ARTERY BYPASS  GRAFT N/A 05/05/2013   Procedure: CORONARY ARTERY BYPASS GRAFTING (CABG) TIMES FIVE USING LEFT INTERNAL MAMMARY ARTERY AND RIGHT AND LEFT SAPHENOUS LEG VEIN HARVESTED ENDOSCOPICALLY;  Surgeon: Melrose Nakayama, MD;  Location: [facility];  Service: Open Heart Surgery;  Laterality: N/A;  . ESOPHAGOGASTRODUODENOSCOPY (EGD) WITH PROPOFOL N/A 11/16/2019   Procedure: ESOPHAGOGASTRODUODENOSCOPY (EGD) WITH PROPOFOL;  Surgeon: Rush Landmark Telford Nab., MD;  Location: [facility];  Service: Gastroenterology;  Laterality: N/A;  . HEMOSTASIS CLIP PLACEMENT  11/16/2019   Procedure: HEMOSTASIS CLIP PLACEMENT;  Surgeon: Irving Copas., MD;  Location: [facility];  Service: Gastroenterology;;  . IMPLANTABLE CARDIOVERTER  DEFIBRILLATOR IMPLANT N/A 07/06/2014   Procedure: St Jude IMPLANTABLE CARDIOVERTER DEFIBRILLATOR IMPLANT;  Surgeon: Deboraha Sprang, MD;  Location: [facility] CATH LAB;  Service: Cardiovascular;  Laterality: N/A;  . LEFT AND RIGHT HEART CATHETERIZATION WITH CORONARY ANGIOGRAM N/A 04/28/2013   Procedure: LEFT AND RIGHT HEART CATHETERIZATION WITH CORONARY ANGIOGRAM;  Surgeon: Burnell Blanks, MD;  Location: [facility] CATH LAB;  Service: Cardiovascular;  Laterality: N/A;  . MULTIPLE EXTRACTIONS WITH ALVEOLOPLASTY N/A 05/03/2013   Procedure: Extraction of tooth #s 3,4,5,6,8,9,27 with alveoloplast and maxillary left osseous tuberosity reduction;  Surgeon: Lenn Cal, DDS;  Location: [facility];  Service: Oral Surgery;  Laterality: N/A;  . POLYPECTOMY  11/16/2019   Procedure: POLYPECTOMY;  Surgeon: Rush Landmark Telford Nab., MD;  Location: [facility];  Service: Gastroenterology;;  . TEE WITHOUT CARDIOVERSION N/A 09/01/2019   Procedure: TRANSESOPHAGEAL ECHOCARDIOGRAM (TEE);  Surgeon: Larey Dresser, MD;  Location: [facility] ENDOSCOPY;  Service: Cardiovascular;  Laterality: N/A;  . TOTAL HIP ARTHROPLASTY Right 11/30/2018   Procedure: TOTAL HIP ARTHROPLASTY ANTERIOR APPROACH;  Surgeon: Rod Can, MD;  Location:  [facility];  Service: Orthopedics;  Laterality: Right;  Marland Kitchen VIDEO BRONCHOSCOPY WITH ENDOBRONCHIAL ULTRASOUND N/A 04/20/2019   Procedure: VIDEO BRONCHOSCOPY WITH ENDOBRONCHIAL ULTRASOUND;  Surgeon: Margaretha Seeds, MD;  Location: [facility];  Service: Thoracic;  Laterality: N/A;    REVIEW OF SYSTEMS:  A comprehensive review of systems was negative except for: Constitutional: positive for fatigue Respiratory: positive for pleurisy/chest pain   PHYSICAL EXAMINATION: General appearance: alert, cooperative, fatigued and no distress Head: Normocephalic, without obvious abnormality, atraumatic Neck: no adenopathy, no JVD, supple, symmetrical, trachea midline and thyroid not enlarged, symmetric, no tenderness/mass/nodules Lymph nodes: Cervical, supraclavicular, and axillary nodes normal. Resp: clear to auscultation bilaterally Back: symmetric, no curvature. ROM normal. No CVA tenderness. Cardio: regular rate and rhythm, S1, S2 normal, no murmur, click, rub or gallop GI: soft, non-tender; bowel sounds normal; no masses,  no organomegaly Extremities: extremities normal, atraumatic, no cyanosis or edema  ECOG PERFORMANCE STATUS: 1 - Symptomatic but completely ambulatory   Blood pressure 112/70, pulse 85, temperature (!) 97.3 F (36.3 C), temperature source Tympanic, resp. rate 18, height 5\' 7"  (1.702 m), weight 193 lb (87.5 kg), SpO2 99 %.  LABORATORY DATA: Lab Results  Component Value Date   WBC 7.4 03/11/2020   HGB 11.0 (L) 03/11/2020   HCT 35.8 (L) 03/11/2020   MCV 84.2 03/11/2020   PLT 228 03/11/2020      Chemistry      Component Value Date/Time   NA 139 03/11/2020 1100   NA 140 08/23/2019 1020   K 3.2 (L) 03/11/2020 1100   CL 102 03/11/2020 1100   CO2 29 03/11/2020 1100   BUN 14 03/11/2020 1100   BUN 52 (H) 08/23/2019 1020   CREATININE 1.36 (H) 03/11/2020 1100   CREATININE 1.35 (H) 12/25/2015 1310      Component Value Date/Time   CALCIUM 9.7 03/11/2020 1100   ALKPHOS 79 03/11/2020  1100   AST 17 03/11/2020 1100   ALT 16 03/11/2020 1100   BILITOT 0.4 03/11/2020 1100       RADIOGRAPHIC STUDIES: CT Chest Wo Contrast  Result Date: 03/11/2020 CLINICAL DATA:  Non-small cell lung cancer staging EXAM: CT CHEST WITHOUT CONTRAST TECHNIQUE: Multidetector CT imaging of the chest was performed following the standard protocol without IV contrast. COMPARISON:  12/11/2019 FINDINGS: Cardiovascular: Aortic atherosclerosis. Normal heart size. Extensive 3 vessel coronary artery calcifications and/or stents. No pericardial effusion. Mediastinum/Nodes: Unchanged post treatment appearance  of soft tissue about the left hilum. No discretely enlarged mediastinal, hilar, or axillary lymph nodes. Thyroid gland, trachea, and esophagus demonstrate no significant findings. Lungs/Pleura: Mild centrilobular emphysema. Slight interval increase in very dense fibrotic consolidation and volume loss of the perihilar left lung and suprahilar left upper lobe. Additional fibrotic scarring of the paramedian right upper lobe. No pleural effusion or pneumothorax. Upper Abdomen: No acute abnormality. Musculoskeletal: No chest wall mass or suspicious bone lesions identified. Bilateral gynecomastia. IMPRESSION: 1. Slight interval increase in very dense fibrotic consolidation and volume loss of the perihilar left lung and suprahilar left upper lobe. Additional fibrotic scarring of the paramedian right upper lobe. 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 2. Unchanged post treatment appearance of soft tissue about the left hilum. 3. No noncontrast evidence of recurrent or metastatic disease in the chest. 4. Emphysema (ICD10-J43.9). 5. Coronary artery disease. Aortic Atherosclerosis (ICD10-I70.0). Electronically Signed   By: Eddie Candle M.D.   On: 03/11/2020 13:49   MYOCARDIAL PERFUSION IMAGING  Result Date: 03/08/2020  Carlton Adam is electrically nondiagnostic for ischemia  Myovue scan shows a large defect in  inferior, inferolateral and apical walls of at least moderate severity consistent with scar and possible soft tissue attenuation (bowel, diaphragm) No significant ischemia.  LVEF calculated at 21%  High risk scan  Compared to previous scan from 2019, changes are more prominent but remain fixed LVEF is down     ASSESSMENT AND PLAN: This is a very pleasant 69 years old male with stage IIIc non-small cell lung cancer, squamous cell carcinoma presented with left upper lobe pleural-based mass in addition to left hilar and right paramediastinal lymphadenopathy. The patient underwent a course of concurrent chemoradiation with weekly carboplatin and paclitaxel status post 7 cycles.   He tolerated his treatment well except for odynophagia and fatigue. Has a scan showed partial response after the treatment but he declined consolidation immunotherapy.   The patient is currently on observation and he is feeling fine. He had repeat CT scan of the chest performed recently.  I personally and independently reviewed the scans and discussed the results with the patient today. His scan showed no concerning findings for disease progression or metastasis. I recommended for him to continue on observation with repeat CT scan of the chest in 4 months. For the history of coronary artery disease and congestive heart failure, the patient is scheduled to see his cardiologist tomorrow. He was advised to call immediately if he has any concerning symptoms in the interval.  The patient voices understanding of current disease status and treatment options and is in agreement with the current care plan.  All questions were answered. The patient knows to call the clinic with any problems, questions or concerns. We can certainly see the patient much sooner if necessary.  Disclaimer: This note was dictated with voice recognition software. Similar sounding words can inadvertently be transcribed and may not be corrected upon  review.

## 2020-03-14 ENCOUNTER — Telehealth: Payer: Self-pay | Admitting: Internal Medicine

## 2020-03-14 ENCOUNTER — Ambulatory Visit (HOSPITAL_COMMUNITY)
Admission: RE | Admit: 2020-03-14 | Discharge: 2020-03-14 | Disposition: A | Discharge status: Home / Self Care | Payer: Medicare HMO | Source: Ambulatory Visit | Attending: Cardiology | Admitting: Cardiology

## 2020-03-14 ENCOUNTER — Encounter (HOSPITAL_COMMUNITY): Payer: Self-pay | Admitting: Cardiology

## 2020-03-14 ENCOUNTER — Telehealth: Payer: Self-pay

## 2020-03-14 DIAGNOSIS — I5022 Chronic systolic (congestive) heart failure: Secondary | ICD-10-CM | POA: Diagnosis not present

## 2020-03-14 LAB — BASIC METABOLIC PANEL
Anion gap: 9 (ref 5–15)
BUN: 10 mg/dL (ref 8–23)
CO2: 28 mmol/L (ref 22–32)
Calcium: 9.4 mg/dL (ref 8.9–10.3)
Chloride: 103 mmol/L (ref 98–111)
Creatinine, Ser: 1.43 mg/dL — ABNORMAL HIGH (ref 0.61–1.24)
GFR calc Af Amer: 58 mL/min — ABNORMAL LOW (ref >60)
GFR calc non Af Amer: 50 mL/min — ABNORMAL LOW (ref >60)
Glucose, Bld: 90 mg/dL (ref 70–99)
Potassium: 3.5 mmol/L (ref 3.5–5.1)
Sodium: 140 mmol/L (ref 135–145)

## 2020-03-14 NOTE — Telephone Encounter (Signed)
-----   Message from Ardeen Garland, RN sent at 03/12/2020 10:13 AM EDT ----- Please call  pt. I put a list of  examples of foods on your desk. Diane ----- Message ----- From: Curt Bears, MD Sent: 03/11/2020   8:51 PM EDT To: Ardeen Garland, RN  Please encourage potassium rich diet. ----- Message ----- From: Buel Ream, Lab In Flasher Sent: 03/11/2020  11:17 AM EDT To: Curt Bears, MD

## 2020-03-14 NOTE — Telephone Encounter (Signed)
Instructions and food list provided to the pt while in the office 03/13/20.

## 2020-03-14 NOTE — Telephone Encounter (Signed)
Scheduled per los. Called and left msg. Mailed printout  °

## 2020-03-19 DIAGNOSIS — I509 Heart failure, unspecified: Secondary | ICD-10-CM | POA: Diagnosis not present

## 2020-03-19 DIAGNOSIS — E1140 Type 2 diabetes mellitus with diabetic neuropathy, unspecified: Secondary | ICD-10-CM | POA: Diagnosis not present

## 2020-03-19 DIAGNOSIS — R0789 Other chest pain: Secondary | ICD-10-CM | POA: Diagnosis not present

## 2020-03-19 DIAGNOSIS — K219 Gastro-esophageal reflux disease without esophagitis: Secondary | ICD-10-CM | POA: Diagnosis not present

## 2020-03-19 DIAGNOSIS — M25511 Pain in right shoulder: Secondary | ICD-10-CM | POA: Diagnosis not present

## 2020-03-19 DIAGNOSIS — M25512 Pain in left shoulder: Secondary | ICD-10-CM | POA: Diagnosis not present

## 2020-03-19 DIAGNOSIS — E785 Hyperlipidemia, unspecified: Secondary | ICD-10-CM | POA: Diagnosis not present

## 2020-03-19 DIAGNOSIS — I5042 Chronic combined systolic (congestive) and diastolic (congestive) heart failure: Secondary | ICD-10-CM | POA: Diagnosis not present

## 2020-03-19 DIAGNOSIS — Z7902 Long term (current) use of antithrombotics/antiplatelets: Secondary | ICD-10-CM | POA: Diagnosis not present

## 2020-03-19 DIAGNOSIS — Z6829 Body mass index (BMI) 29.0-29.9, adult: Secondary | ICD-10-CM | POA: Diagnosis not present

## 2020-03-19 DIAGNOSIS — Z79899 Other long term (current) drug therapy: Secondary | ICD-10-CM | POA: Diagnosis not present

## 2020-03-19 DIAGNOSIS — I2510 Atherosclerotic heart disease of native coronary artery without angina pectoris: Secondary | ICD-10-CM | POA: Diagnosis not present

## 2020-03-19 DIAGNOSIS — I480 Paroxysmal atrial fibrillation: Secondary | ICD-10-CM | POA: Diagnosis not present

## 2020-03-19 DIAGNOSIS — J449 Chronic obstructive pulmonary disease, unspecified: Secondary | ICD-10-CM | POA: Diagnosis not present

## 2020-03-19 DIAGNOSIS — C3492 Malignant neoplasm of unspecified part of left bronchus or lung: Secondary | ICD-10-CM | POA: Diagnosis not present

## 2020-03-26 ENCOUNTER — Ambulatory Visit (INDEPENDENT_AMBULATORY_CARE_PROVIDER_SITE_OTHER): Payer: Medicare HMO

## 2020-03-26 DIAGNOSIS — Z01818 Encounter for other preprocedural examination: Secondary | ICD-10-CM | POA: Diagnosis not present

## 2020-03-26 DIAGNOSIS — H25812 Combined forms of age-related cataract, left eye: Secondary | ICD-10-CM | POA: Diagnosis not present

## 2020-03-26 DIAGNOSIS — I5022 Chronic systolic (congestive) heart failure: Secondary | ICD-10-CM

## 2020-03-26 DIAGNOSIS — E119 Type 2 diabetes mellitus without complications: Secondary | ICD-10-CM | POA: Diagnosis not present

## 2020-03-28 ENCOUNTER — Ambulatory Visit (HOSPITAL_COMMUNITY)
Admission: RE | Admit: 2020-03-28 | Discharge: 2020-03-28 | Disposition: A | Discharge status: Home / Self Care | Payer: Medicare HMO | Source: Ambulatory Visit | Attending: Cardiology | Admitting: Cardiology

## 2020-03-28 ENCOUNTER — Other Ambulatory Visit: Payer: Self-pay

## 2020-03-28 VITALS — BP 108/66 | HR 97 | Wt 192.0 lb

## 2020-03-28 DIAGNOSIS — Z5181 Encounter for therapeutic drug level monitoring: Secondary | ICD-10-CM | POA: Insufficient documentation

## 2020-03-28 DIAGNOSIS — I480 Paroxysmal atrial fibrillation: Secondary | ICD-10-CM | POA: Insufficient documentation

## 2020-03-28 DIAGNOSIS — I25810 Atherosclerosis of coronary artery bypass graft(s) without angina pectoris: Secondary | ICD-10-CM | POA: Insufficient documentation

## 2020-03-28 DIAGNOSIS — J449 Chronic obstructive pulmonary disease, unspecified: Secondary | ICD-10-CM | POA: Diagnosis not present

## 2020-03-28 DIAGNOSIS — Z720 Tobacco use: Secondary | ICD-10-CM | POA: Insufficient documentation

## 2020-03-28 DIAGNOSIS — Z7984 Long term (current) use of oral hypoglycemic drugs: Secondary | ICD-10-CM | POA: Insufficient documentation

## 2020-03-28 DIAGNOSIS — E1122 Type 2 diabetes mellitus with diabetic chronic kidney disease: Secondary | ICD-10-CM | POA: Insufficient documentation

## 2020-03-28 DIAGNOSIS — I5022 Chronic systolic (congestive) heart failure: Secondary | ICD-10-CM | POA: Diagnosis not present

## 2020-03-28 DIAGNOSIS — I739 Peripheral vascular disease, unspecified: Secondary | ICD-10-CM | POA: Diagnosis not present

## 2020-03-28 DIAGNOSIS — Z85118 Personal history of other malignant neoplasm of bronchus and lung: Secondary | ICD-10-CM | POA: Diagnosis not present

## 2020-03-28 DIAGNOSIS — Z7901 Long term (current) use of anticoagulants: Secondary | ICD-10-CM | POA: Diagnosis not present

## 2020-03-28 DIAGNOSIS — I255 Ischemic cardiomyopathy: Secondary | ICD-10-CM | POA: Insufficient documentation

## 2020-03-28 DIAGNOSIS — I130 Hypertensive heart and chronic kidney disease with heart failure and stage 1 through stage 4 chronic kidney disease, or unspecified chronic kidney disease: Secondary | ICD-10-CM | POA: Insufficient documentation

## 2020-03-28 DIAGNOSIS — Z951 Presence of aortocoronary bypass graft: Secondary | ICD-10-CM | POA: Insufficient documentation

## 2020-03-28 DIAGNOSIS — Z95810 Presence of automatic (implantable) cardiac defibrillator: Secondary | ICD-10-CM | POA: Insufficient documentation

## 2020-03-28 DIAGNOSIS — N183 Chronic kidney disease, stage 3 unspecified: Secondary | ICD-10-CM | POA: Diagnosis not present

## 2020-03-28 DIAGNOSIS — Z79899 Other long term (current) drug therapy: Secondary | ICD-10-CM | POA: Insufficient documentation

## 2020-03-28 MED ORDER — METOPROLOL SUCCINATE ER 25 MG PO TB24: 25.0000 mg | ORAL_TABLET | Freq: Every day | ORAL | 11 refills | Status: AC

## 2020-03-28 NOTE — Patient Instructions (Addendum)
It was a pleasure seeing you today!  MEDICATIONS: -We are changing your medications today -Start metoprolol succinate 12.5 mg (1 tablet) daily for 1 week, then increase to 25 mg (1 tablet) daily. -Call if you have questions about your medications.  NEXT APPOINTMENT: Return to clinic in 3 weeks with APP [facility].  In general, to take care of your heart failure: -Limit your fluid intake to 2 Liters (half-gallon) per day.   -Limit your salt intake to ideally 2-3 grams (2000-3000 mg) per day. -Weigh yourself daily and record, and bring that "weight diary" to your next appointment.  (Weight gain of 2-3 pounds in 1 day typically means fluid weight.) -The medications for your heart are to help your heart and help you live longer.   -Please contact us before stopping any of your heart medications.  Call the clinic at [phone] with questions or to reschedule future appointments.

## 2020-03-28 NOTE — Progress Notes (Signed)
PCP: [name] [facility]) Cardiac Surgeon: Dr Roxan Hockey Pulmonologist: Dr. Lamonte Sakai Cardiology: Dr. Aundra Dubin  HPI:  [name] is a 69 y.o. with history of COPD, HTN, 3V CAD s/p CABG x 5  (CABG 04/2013: LIMA to LAD, SVG to second diagonal, SVG to ramus intermediate and obtuse marginal 2, SVG to posterior descending), ischemic cardiomyopathy, chronic systolic HF, COPD, DM2, PVD and tobacco abuse. St Jude ICD.   Lexiscan Cardiolite in 12/19 showed inferior infarct, no ischemia.  Echo in 12/19 showed EF 35%, mild LVH, mildly decreased RV systolic function.  ABIs in 12/19 were stable compared to the past.    In the fall of 2020, he was diagnosed with non-small cell lung cancer.  He has been treated with radiation and carboplatin/paclitaxel.  He has lost over 20 lbs.  He was admitted 1/21 at [facility] with ICD shocks.  These were found to be inappropriate shocks probably due to atrial fibrillation.  He is now on amiodarone and Eliquis.    TEE in 3/21 was done to assess mitral regurgitation, showing EF 25-30%, mildly decreased RV systolic function, 3+ likely ischemic MR.  In 5/21, he had symptomatic anemia.  EGD/colonoscopy done, no definite cause. ABIs in 5/21 were stable.   He was recently seen by Dr. Aundra Dubin on 01/24/2020 and was volume overloaded. Weight was up 6 lbs but his BP was low in the 91Y systolic. It appeared he had been taking both carvedilol and metoprolol succinate. Given his COPD, he was advised to stop carvedilol and to continue metoprolol succinate. Hydralazine was also discontinued. Furosemide was increased to 40 mg daily. Plan was to refer to paramedicine, but he did not qualify as he lives in [place]). He has a home health aid that assists him with meds.   He recently returned to HF Clinic for follow up on 03/05/20 with Lyda Jester, PA-C. His weight was down 13 lbs from 204>>191 lbs. Hypotension resolved, BP was 112/64. PCP recently stopped most of his HF  meds due to worsening renal function and hyperkalemia. SW assisted with visit and confirmed that he was taken off furosemide, Farxiga, spironolactone, amlodipine, metoprolol and Entresto.   He reported that he was feeling better overall. Dyspnea had resolved. He complained of intermittent left sided chest pain, unsure if similar to pre-cabg angina (cannot recall exact symptoms at that time). Pattern and characteristics however seemed more atypical, described as sharp in nature, not worsened by exertion and exacerbated when he tries sleeping on his left shoulder. The pain does radiate to his back and is not associated with meals. His EKG however did show inferolateral ST changes. No active CP at time of appointment.   Today he returns to HF clinic for pharmacist medication titration. At last visit with Lyda Jester, PA-C, furosemide 40 mg daily and potassium chloride 20 mEq daily were restarted due to elevated BNP. Overall, patient is feeling great other than shoulder pain. Patient denies dizziness or lightheadedness. Patient has been feeling more tired than usual but only some days, usually when he is busy. His breathing is "pretty good" but he admits to getting SOB when sweeping the floor. He can walk 50 yards before feeling SOB. His weight at home is generally 191 lbs. He admitted to not checking his weight daily -- encouraged patient to check weight daily so we can accurately evaluate his fluid status. Takes furosemide 40 mg daily and has not needed any extra. No LEE, PND or orthopnea. Patient reported trying to limit salt  by using only when needed for palatability. We discussed tasting food before adding salt for additional sodium limitation.   HF Medications: Furosemide 40mg  daily  Potassium chloride 20 mEq daily  Has the patient been experiencing any side effects to the medications prescribed?  NO  Does the patient have any problems obtaining medications due to transportation or finances?   NO--has [employer]/Medicaid  Understanding of regimen: Excellent Understanding of indications: Good Potential of compliance: Excellent Patient understands to avoid NSAIDs. Patient understands to avoid decongestants.   Pertinent Lab Values 03/14/20: . Serum creatinine 1.43, BUN 10, Potassium 3.5, Sodium 140, BNP 285   Vital Signs: . Weight: 192 lbs (last clinic weight: 191 lbs) . Blood pressure: 108/66  . Heart rate: 97 bpm   Assessment: 1. CAD s/p CABG: Had cath in 10/16 with patent grafts, medical management.  Cardiolite in 12/19 showed no ischemia (just prior infarction).  He had recent chest pain with mostly atypical features but EKG with new inferolateral ST changes. No active CP currently.  - Continue statin.  - No ASA given Eliquis use.     2. Chronic systolic HF: Ischemic cardiomyopathy, St Jude ICD.  TEE (3/21) with EF 25-30%.   - it appears he was recently taken off most of his HF meds by PCP given worsening SCr and hyperkalemia. Was previously on Entresto, Farxiga and Spironolactone.  - NYHA II-III, euvolemic on exam. - Continue furosemide 40 mg daily and potassium chloride 20 mEq daily.  - Start metoprolol succinate 12.5 mg daily for 1 week, then increase to 25 mg daily.  3. COPD/smoking: He has quit smoking.  4. PAD: stable ABIs in 5/21. He denied claudication.  - Continue statin.   - no longer smoking - no ASA with Eliquis use  5. CKD: Stage 3.   6. Atrial fibrillation: Paroxysmal. -Had inappropriate ICD shocks in setting of atrial fibrillation with RVR.  - Continue Eliquis.  - Continue amiodarone.  7. Lung cancer: NSCLC.  Radiation is complete.    Plan: 1) Medication changes: Based on clinical presentation, vital signs and recent labs, will initiate metoprolol succinate 12.5 mg daily for one week, then increase to 25 mg daily.  2) Follow-up with APP in 3 weeks   Audry Riles, PharmD, BCPS, BCCP, CPP Heart Failure Clinic Pharmacist [phone]

## 2020-03-29 DIAGNOSIS — C3492 Malignant neoplasm of unspecified part of left bronchus or lung: Secondary | ICD-10-CM | POA: Diagnosis not present

## 2020-03-29 DIAGNOSIS — K5900 Constipation, unspecified: Secondary | ICD-10-CM | POA: Diagnosis not present

## 2020-03-29 DIAGNOSIS — I2510 Atherosclerotic heart disease of native coronary artery without angina pectoris: Secondary | ICD-10-CM | POA: Diagnosis not present

## 2020-03-29 DIAGNOSIS — M25512 Pain in left shoulder: Secondary | ICD-10-CM | POA: Diagnosis not present

## 2020-03-29 DIAGNOSIS — R0789 Other chest pain: Secondary | ICD-10-CM | POA: Diagnosis not present

## 2020-03-29 DIAGNOSIS — Z6830 Body mass index (BMI) 30.0-30.9, adult: Secondary | ICD-10-CM | POA: Diagnosis not present

## 2020-03-29 DIAGNOSIS — I509 Heart failure, unspecified: Secondary | ICD-10-CM | POA: Diagnosis not present

## 2020-04-02 LAB — CUP PACEART REMOTE DEVICE CHECK
Battery Remaining Longevity: 50 mo
Battery Remaining Percentage: 50 %
Battery Voltage: 2.92 V
Brady Statistic RV Percent Paced: 1 %
Date Time Interrogation Session: 20211005020016
HighPow Impedance: 55 Ohm
HighPow Impedance: 55 Ohm
Implantable Lead Implant Date: 20160115
Implantable Lead Location: 753860
Implantable Lead Model: 7122
Implantable Pulse Generator Implant Date: 20160115
Lead Channel Impedance Value: 350 Ohm
Lead Channel Pacing Threshold Amplitude: 1 V
Lead Channel Pacing Threshold Pulse Width: 0.5 ms
Lead Channel Sensing Intrinsic Amplitude: 4.9 mV
Lead Channel Setting Pacing Amplitude: 2.5 V
Lead Channel Setting Pacing Pulse Width: 0.5 ms
Lead Channel Setting Sensing Sensitivity: 0.5 mV
Pulse Gen Serial Number: 7233603

## 2020-04-08 DIAGNOSIS — C3492 Malignant neoplasm of unspecified part of left bronchus or lung: Secondary | ICD-10-CM | POA: Diagnosis not present

## 2020-04-08 DIAGNOSIS — R0789 Other chest pain: Secondary | ICD-10-CM | POA: Diagnosis not present

## 2020-04-08 DIAGNOSIS — Z6829 Body mass index (BMI) 29.0-29.9, adult: Secondary | ICD-10-CM | POA: Diagnosis not present

## 2020-04-08 DIAGNOSIS — Z23 Encounter for immunization: Secondary | ICD-10-CM | POA: Diagnosis not present

## 2020-04-09 NOTE — Progress Notes (Signed)
Remote ICD transmission.   

## 2020-04-15 ENCOUNTER — Encounter (HOSPITAL_COMMUNITY): Payer: Self-pay

## 2020-04-15 ENCOUNTER — Other Ambulatory Visit: Payer: Self-pay

## 2020-04-15 ENCOUNTER — Ambulatory Visit (HOSPITAL_COMMUNITY)
Admission: RE | Admit: 2020-04-15 | Discharge: 2020-04-15 | Disposition: A | Discharge status: Home / Self Care | Payer: Medicare HMO | Source: Ambulatory Visit | Attending: Cardiology | Admitting: Cardiology

## 2020-04-15 VITALS — BP 112/76 | HR 93 | Wt 194.6 lb

## 2020-04-15 DIAGNOSIS — I5022 Chronic systolic (congestive) heart failure: Secondary | ICD-10-CM | POA: Insufficient documentation

## 2020-04-15 DIAGNOSIS — E1122 Type 2 diabetes mellitus with diabetic chronic kidney disease: Secondary | ICD-10-CM | POA: Diagnosis not present

## 2020-04-15 DIAGNOSIS — C3490 Malignant neoplasm of unspecified part of unspecified bronchus or lung: Secondary | ICD-10-CM | POA: Insufficient documentation

## 2020-04-15 DIAGNOSIS — Z7901 Long term (current) use of anticoagulants: Secondary | ICD-10-CM | POA: Diagnosis not present

## 2020-04-15 DIAGNOSIS — I480 Paroxysmal atrial fibrillation: Secondary | ICD-10-CM | POA: Diagnosis not present

## 2020-04-15 DIAGNOSIS — I130 Hypertensive heart and chronic kidney disease with heart failure and stage 1 through stage 4 chronic kidney disease, or unspecified chronic kidney disease: Secondary | ICD-10-CM | POA: Insufficient documentation

## 2020-04-15 DIAGNOSIS — Z951 Presence of aortocoronary bypass graft: Secondary | ICD-10-CM | POA: Insufficient documentation

## 2020-04-15 DIAGNOSIS — E1151 Type 2 diabetes mellitus with diabetic peripheral angiopathy without gangrene: Secondary | ICD-10-CM | POA: Diagnosis not present

## 2020-04-15 DIAGNOSIS — Z87891 Personal history of nicotine dependence: Secondary | ICD-10-CM | POA: Insufficient documentation

## 2020-04-15 DIAGNOSIS — N183 Chronic kidney disease, stage 3 unspecified: Secondary | ICD-10-CM | POA: Insufficient documentation

## 2020-04-15 DIAGNOSIS — Z923 Personal history of irradiation: Secondary | ICD-10-CM | POA: Diagnosis not present

## 2020-04-15 DIAGNOSIS — Z79899 Other long term (current) drug therapy: Secondary | ICD-10-CM | POA: Insufficient documentation

## 2020-04-15 DIAGNOSIS — Z95810 Presence of automatic (implantable) cardiac defibrillator: Secondary | ICD-10-CM | POA: Insufficient documentation

## 2020-04-15 DIAGNOSIS — Z833 Family history of diabetes mellitus: Secondary | ICD-10-CM | POA: Insufficient documentation

## 2020-04-15 DIAGNOSIS — J449 Chronic obstructive pulmonary disease, unspecified: Secondary | ICD-10-CM | POA: Insufficient documentation

## 2020-04-15 DIAGNOSIS — Z8249 Family history of ischemic heart disease and other diseases of the circulatory system: Secondary | ICD-10-CM | POA: Insufficient documentation

## 2020-04-15 DIAGNOSIS — I2510 Atherosclerotic heart disease of native coronary artery without angina pectoris: Secondary | ICD-10-CM | POA: Insufficient documentation

## 2020-04-15 LAB — BASIC METABOLIC PANEL
Anion gap: 11 (ref 5–15)
BUN: 25 mg/dL — ABNORMAL HIGH (ref 8–23)
CO2: 19 mmol/L — ABNORMAL LOW (ref 22–32)
Calcium: 9 mg/dL (ref 8.9–10.3)
Chloride: 107 mmol/L (ref 98–111)
Creatinine, Ser: 1.74 mg/dL — ABNORMAL HIGH (ref 0.61–1.24)
GFR, Estimated: 42 mL/min — ABNORMAL LOW (ref >60)
Glucose, Bld: 106 mg/dL — ABNORMAL HIGH (ref 70–99)
Potassium: 4.2 mmol/L (ref 3.5–5.1)
Sodium: 137 mmol/L (ref 135–145)

## 2020-04-15 LAB — CBC
HCT: 28.5 % — ABNORMAL LOW (ref 39.0–52.0)
Hemoglobin: 8.2 g/dL — ABNORMAL LOW (ref 13.0–17.0)
MCH: 26.1 pg (ref 26.0–34.0)
MCHC: 28.8 g/dL — ABNORMAL LOW (ref 30.0–36.0)
MCV: 90.8 fL (ref 80.0–100.0)
Platelets: 176 10*3/uL (ref 150–400)
RBC: 3.14 MIL/uL — ABNORMAL LOW (ref 4.22–5.81)
RDW: 21.3 % — ABNORMAL HIGH (ref 11.5–15.5)
WBC: 11.5 10*3/uL — ABNORMAL HIGH (ref 4.0–10.5)
nRBC: 0.9 % — ABNORMAL HIGH (ref 0.0–0.2)

## 2020-04-15 MED ORDER — DAPAGLIFLOZIN PROPANEDIOL 10 MG PO TABS: 10.0000 mg | ORAL_TABLET | Freq: Every day | ORAL | 3 refills | Status: AC

## 2020-04-15 NOTE — Progress Notes (Signed)
Patient ID: [name] [name]., male   DOB: 1950-06-28, 69 y.o.   MRN: [id number] PCP: [name]) Cardiac Surgeon: Dr Roxan Hockey Pulmonologist: Dr. Lamonte Sakai Cardiology: Dr. Aundra Dubin  HPI: [name] [name] is a 69 y.o. with history of COPD, HTN, 3V CAD s/p CABG x 5  (CABG 04/2013: LIMA to LAD, SVG to second diagonal, SVG to ramus intermediate and obtuse marginal 2, SVG to posterior descending), ischemic cardiomyopathy, chronic systolic HF, COPD, DM2, PVD and tobacco abuse. St Jude ICD.   Lexiscan Cardiolite in 12/19 showed inferior infarct, no ischemia.  Echo in 12/19 showed EF 35%, mild LVH, mildly decreased RV systolic function.  ABIs in 12/19 were stable compared to the past.    In the fall of 2020, he was diagnosed with non-small cell lung cancer.  He has been treated with radiation and carboplatin/palclitaxel.  He has lost over 20 lbs.  He was admitted 1/21 at [facility] with ICD shocks.  These were found to be inappropriate shocks probably due to atrial fibrillation.  He is now on amiodarone and Eliquis.    TEE in 3/21 was done to assess mitral regurgitation, showing EF 25-30%, mildly decreased RV systolic function, 3+ likely ischemic MR.  In 5/21, he had symptomatic anemia.  EGD/colonoscopy done, no definite cause. ABIs in 5/21 were stable.   He was recently seen by Dr. Aundra Dubin 8/4 and was volume overloaded. Wt was up 6 lb but his BP was low in the 40J systolic. It appeared he had been taking both coreg and Toprol XL. Given his COPD, he was advised to stop Coreg and to continue Toprol XL. Hydralazine was also discontinued. Lasix was increased to 40 mg daily. Plan was to refer to paramedicine, but did not qualify as he lives in [place] He has a home health aid that assist around his home.  Per his report, it sounds like she assists him with meds.   I saw him back for return f/u and his wt was down 13 lb from 204>>191 lb but he remained mildly fluid overloaded and SOB. BNP  was 285. Hypotension had resolved. BP was 112/64. Upon investigation, it appeared his PCP had recently stopped most of his HF meds due to worsening renal function and hyperkalemia. He had been taken off Lasix, Farxiga, spironolactone, amlodipine, metoprolol and Entresto. I elected to restart his lasix at 40 mg daily. Repeat BMP showed SCr at 1.64. At his last visit, he also endorsed atypical CP. Given CAD history, he was ordered to get a NST. This showed no ischemia.   He presents back to clinic today for f/u. He complains of mild DOE. No resting dyspnea. He is mildly fluid overloaded on Optivol. Impedence is down. ReDs Clip also elevated at 39%. No arrhthymias on device interrogation. BP in the low 811B systolic. No orthostatic symptoms. Has already taken AM meds today.   RedS clip 39%    ECG: not performed   Labs (11/19): K 4.6, creatinine 2.05 Labs(12/19): K 4.3, creatinine 2.07, LDL 57  Labs (9/20): K 4.2, creatinine 2.09, LDL 50 Labs (2/21): TSH normal, K 3.9, creatinine 1.92, AST 56, ALT 176 Labs (3/21): K 3.4, creatinine 1.57, hgb 10.7, LFTs normal Labs (6/21): K 4.1, creatinine 1.81, hgb 9.1, LFTs normal.  Labs (8/321): K 4.1, creatinine 2.11, TSH normal, AST 52, ALT 65  Labs (01/25/20): K 5.1, creatinine 1.75  Labs (02/02/20): K 5.3, creatinine 2.51 Labs (02/09/20): K 5.1, creatinine 2.27     PMH: 1.  Chronic systolic CHF: Ischemic cardiomyopathy.  St Jude ICD.  - ECHO 06/26/13 EF 20-25% RV mild HK - ECHO 10/10/13 EF 20-25% restrictive filling pattern. Moderate RV dysfunction. Mild MR. - Cardiolite (10/16) with EF 19%, anterior ischemia.  - Echo 7/17 EF 30-35% - Echo (12/19): EF 35%, mild LVH, basal-mid inferior akinesis, mildly decreased RV systolic function, mild MR.  - TEE (3/21): EF 25-30%, mild LV dilation, mildly decreased RV systolic function, 3+ MR with restricted posterior leaflet, PISA ERO 0.22 cm^2.  2. CAD: CABG x 5 11/14 with LIMA-LAD, SVG-D2, seq SVG-ramus/OM2, SVG-PDA.   - LHC (10/16) with patent LIMA-LAD and 60% pLAD stenosis (competitive flow). SVG-D, sequential SVG-ramus/OM2, SVG-PDA all patent. Medical management.  - Lexiscan Cardiolite in 12/19 showed inferior infarction, no ischemia.  3. COPD: Has not quit smoking.   4. PAD:  - ABIs 11/07/13: RABI .63 moderate arterial occlusive dz,  LABI .78 moderate arterial occlusive dz - ABIs 9/16: RABI .68, LABI 0.75 - ABIs 12/19: right ABI 0.75, left ABI 0.7 - ABIs 5/21: 0.7 right, 0.69 left.  5. Type 2 diabetes 6. Hyperlipidemia 7. Osteoarthritis: Left THR in 6/20.  8. Gout  9. Hyperlipidemia 10. CKD: Stage 3.  11. Non-small cell lung cancer: Diagnosed 10/20, treated with carboplatin/paclitaxel + radiation.   12. Atrial fibrillation: Paroxysmal.  13. Mitral regurgitation: Suspect ischemic MR with restricted posterior leaflet.  3+ MR on 3/21 TEE.   SH: Lives with his girlfriend.  Quit smoking in 2020. Does not drink alcohol.   FH: 2 brothers died from heart attacks, Mother DM, Dad HTN   ROS: All systems negative except as listed in HPI, PMH and Problem List.  Current Outpatient Medications  Medication Sig Dispense Refill  . albuterol (ACCUNEB) 1.25 MG/3ML nebulizer solution Take 1 ampule by nebulization every 6 (six) hours as needed for wheezing.    Marland Kitchen albuterol (VENTOLIN HFA) 108 (90 Base) MCG/ACT inhaler Inhale 1-2 puffs into the lungs every 6 (six) hours as needed for wheezing or shortness of breath. 6.7 g 6  . allopurinol (ZYLOPRIM) 300 MG tablet Take 300 mg by mouth daily.    Marland Kitchen atorvastatin (LIPITOR) 20 MG tablet Take 20 mg by mouth daily.    Marland Kitchen b complex vitamins tablet Take 1 tablet by mouth at bedtime.     . B Complex-C-Folic Acid (RENA-VITE RX) 1 MG TABS Take 1 tablet by mouth daily.    . chlorproMAZINE (THORAZINE) 25 MG tablet Take 1 tablet (25 mg total) by mouth 3 (three) times daily as needed. 30 tablet 0  . Cholecalciferol (VITAMIN D3) 25 MCG (1000 UT) CAPS Take 1,000 Units by mouth daily at  12 noon.     Marland Kitchen ELIQUIS 5 MG TABS tablet Take 5 mg by mouth 2 (two) times daily.     . ferrous gluconate (FERGON) 324 MG tablet Take 1 tablet (324 mg total) by mouth daily with breakfast. 30 tablet 1  . furosemide (LASIX) 20 MG tablet Take 2 tablets (40 mg total) by mouth daily. 60 tablet 11  . guaiFENesin-dextromethorphan (ROBITUSSIN DM) 100-10 MG/5ML syrup Take 5 mLs by mouth every 4 (four) hours as needed for cough.     Marland Kitchen HYDROcodone-acetaminophen (NORCO) 10-325 MG tablet Take 1 tablet by mouth.    . Melatonin 3 MG TABS Take 3 mg by mouth at bedtime.    . metoprolol succinate (TOPROL-XL) 25 MG 24 hr tablet Take 1 tablet (25 mg total) by mouth daily. 30 tablet 11  . nitroGLYCERIN (NITROSTAT) 0.4  MG SL tablet Place 0.4 mg under the tongue every 5 (five) minutes as needed for chest pain.     Marland Kitchen omeprazole (PRILOSEC) 40 MG capsule Take 40 mg by mouth daily.    . potassium chloride SA (KLOR-CON) 20 MEQ tablet Take 1 tablet (20 mEq total) by mouth daily. 2 tablet 0  . senna (SENOKOT) 8.6 MG TABS tablet Take 1 tablet (8.6 mg total) by mouth 2 (two) times daily. 120 each 0  . SPIRIVA HANDIHALER 18 MCG inhalation capsule Place 1 capsule into inhaler and inhale daily.    . sucralfate (CARAFATE) 1 g tablet Take 1 tablet (1 g total) by mouth 4 (four) times daily -  with meals and at bedtime. 5 min before meals for radiation induced esophagitis 120 tablet 2  . tiZANidine (ZANAFLEX) 4 MG capsule Take 4 mg by mouth 3 (three) times daily as needed for muscle spasms.      No current facility-administered medications for this encounter.    Vitals:   04/15/20 0857  BP: 112/76  Pulse: 93  SpO2: 99%  Weight: 88.3 kg (194 lb 9.6 oz)    Wt Readings from Last 3 Encounters:  04/15/20 88.3 kg (194 lb 9.6 oz)  03/28/20 87.1 kg (192 lb)  03/13/20 87.5 kg (193 lb)   PHYSICAL EXAM: ReDs clip 39% General:  No acute distress, looks much older than actual age. No respiratory difficulty HEENT: normal Neck:  supple. JVD ~8 cm. Carotids 2+ bilat; no bruits. No lymphadenopathy or thyromegaly appreciated. Cor: PMI nondisplaced. Regular rate & rhythm. No rubs, gallops or murmurs. Lungs: clear Abdomen: soft, nontender, nondistended. No hepatosplenomegaly. No bruits or masses. Good bowel sounds. Extremities: no cyanosis, clubbing, rash, edema Neuro: alert & oriented x 3, cranial nerves grossly intact. moves all 4 extremities w/o difficulty. Affect pleasant. .   ASSESSMENT & PLAN:  1. CAD s/p CABG: Had cath in 10/16 with patent grafts, medical management.  Cardiolite in 12/19 showed no ischemia (just prior infarction).  Repeat NST for atypical CP 9/21 showed no ischemia - continue medical therapy  - Continue statin.  - No ASA given Eliquis use.     2. Chronic systolic HF: Ischemic cardiomyopathy, St Jude ICD.  TEE (3/21) with EF 25-30%.  - NYHA II-III. - volume up based on Optivol and elevated ReDs clip at 39% - restart Farxiga 10 mg daily  - Continue lasix 40 mg daily  - I don't think his BP will tolerate retrial of Entresto (recently stopped due to AKI and hypotension) - continue Toprol XL 25 mg daily  - off hydralazine due to hypotension  - check BMP today and again in 10 days  3. COPD/smoking: He has now quit smoking.  4. PAD: stable ABIs in 5/21. He denies claudication.  - Continue statin.   - no longer smoking - no ASA w/ eliquis use  5. CKD: Stage 3.   - check BMP today and again in 7 days (adding back [place])  6. Atrial fibrillation: Paroxysmal.  RRR on exam today.   - Continue Eliquis. Check CBC today  - Continue amiodarone. TSH and HFT normal 8/21. He will need regular eye exam.  7. Lung cancer: NSCLC.  Radiation is completed.    - f/u in 3-4 weeks   Lyda Jester, PA-C 04/15/2020

## 2020-04-15 NOTE — Progress Notes (Signed)
ReDS Vest / Clip - 04/15/20 0900      ReDS Vest / Clip   Station Marker D    Ruler Value 31    ReDS Value Range Moderate volume overload    ReDS Actual Value 39    Anatomical Comments sitting

## 2020-04-15 NOTE — Patient Instructions (Addendum)
RESTART Wilder Glade 10mg  (1 tablet) daily  Labs done today, your results will be available in MyChart, we will contact you for abnormal readings.  Your physician recommends that you return for repeat labs in 10 days  Your physician recommends that you return for medication adjustments in 3-4 weeks  If you have any questions or concerns before your next appointment please send [place] a message through [place] or call our office at [phone].    TO LEAVE A MESSAGE FOR THE NURSE SELECT OPTION 2, PLEASE LEAVE A MESSAGE INCLUDING: . YOUR NAME . DATE OF BIRTH . CALL BACK NUMBER . REASON FOR CALL**this is important as we prioritize the call backs  YOU WILL RECEIVE A CALL BACK THE SAME DAY AS LONG AS YOU CALL BEFORE 4:00 PM

## 2020-04-16 ENCOUNTER — Telehealth (HOSPITAL_COMMUNITY): Payer: Self-pay | Admitting: Cardiology

## 2020-04-16 DIAGNOSIS — D638 Anemia in other chronic diseases classified elsewhere: Secondary | ICD-10-CM

## 2020-04-16 DIAGNOSIS — H259 Unspecified age-related cataract: Secondary | ICD-10-CM | POA: Diagnosis not present

## 2020-04-16 DIAGNOSIS — I110 Hypertensive heart disease with heart failure: Secondary | ICD-10-CM | POA: Diagnosis not present

## 2020-04-16 DIAGNOSIS — I509 Heart failure, unspecified: Secondary | ICD-10-CM | POA: Diagnosis not present

## 2020-04-16 DIAGNOSIS — Z981 Arthrodesis status: Secondary | ICD-10-CM | POA: Diagnosis not present

## 2020-04-16 DIAGNOSIS — Z95810 Presence of automatic (implantable) cardiac defibrillator: Secondary | ICD-10-CM | POA: Diagnosis not present

## 2020-04-16 DIAGNOSIS — E1136 Type 2 diabetes mellitus with diabetic cataract: Secondary | ICD-10-CM | POA: Diagnosis not present

## 2020-04-16 DIAGNOSIS — I5042 Chronic combined systolic (congestive) and diastolic (congestive) heart failure: Secondary | ICD-10-CM

## 2020-04-16 DIAGNOSIS — E785 Hyperlipidemia, unspecified: Secondary | ICD-10-CM | POA: Diagnosis not present

## 2020-04-16 DIAGNOSIS — H25812 Combined forms of age-related cataract, left eye: Secondary | ICD-10-CM | POA: Diagnosis not present

## 2020-04-16 DIAGNOSIS — J449 Chronic obstructive pulmonary disease, unspecified: Secondary | ICD-10-CM | POA: Diagnosis not present

## 2020-04-16 DIAGNOSIS — Z87891 Personal history of nicotine dependence: Secondary | ICD-10-CM | POA: Diagnosis not present

## 2020-04-16 NOTE — Telephone Encounter (Signed)
-----   Message from Consuelo Pandy, [place] sent at 04/16/2020  2:32 PM EDT ----- SCr mildly elevated. Hgb low. Repeat BMP and CBC in 1 week. Also check TIBC, Ferritin and Iron level.

## 2020-04-16 NOTE — Telephone Encounter (Signed)
Additional labs added to upcoming appt

## 2020-04-18 ENCOUNTER — Encounter: Payer: Self-pay | Admitting: Podiatry

## 2020-04-18 ENCOUNTER — Other Ambulatory Visit: Payer: Self-pay

## 2020-04-18 ENCOUNTER — Ambulatory Visit (INDEPENDENT_AMBULATORY_CARE_PROVIDER_SITE_OTHER): Payer: Medicare HMO | Admitting: Podiatry

## 2020-04-18 DIAGNOSIS — E119 Type 2 diabetes mellitus without complications: Secondary | ICD-10-CM

## 2020-04-18 DIAGNOSIS — E1151 Type 2 diabetes mellitus with diabetic peripheral angiopathy without gangrene: Secondary | ICD-10-CM

## 2020-04-18 DIAGNOSIS — B351 Tinea unguium: Secondary | ICD-10-CM

## 2020-04-18 DIAGNOSIS — M2011 Hallux valgus (acquired), right foot: Secondary | ICD-10-CM

## 2020-04-18 DIAGNOSIS — M79675 Pain in left toe(s): Secondary | ICD-10-CM | POA: Diagnosis not present

## 2020-04-18 DIAGNOSIS — M2012 Hallux valgus (acquired), left foot: Secondary | ICD-10-CM | POA: Diagnosis not present

## 2020-04-18 DIAGNOSIS — M79674 Pain in right toe(s): Secondary | ICD-10-CM

## 2020-04-18 NOTE — Patient Instructions (Signed)
Mr. Werts, your skin is dry, so we need you to apply lotion to your feet everyday.  Apply Cerave Lotion to both feet once daily. Do not apply between toes.

## 2020-04-20 NOTE — Progress Notes (Signed)
ANNUAL DIABETIC FOOT EXAM  Subjective: [name] [name]. presents today for for annual diabetic foot examination, at risk foot care. Pt has h/o NIDDM with PAD and painful thick toenails that are difficult to trim. Pain interferes with ambulation. Aggravating factors include wearing enclosed shoe gear. Pain is relieved with periodic professional debridement..  Patient relates 5 year h/o diabetes.  Patient denies any h/o foot wounds.  Patient denies symptoms of foot numbness.  Patient denies symptoms of foot tingling.  Patient denies symptoms of burning in feet.  [name], PA-C is patient's PCP. Last visit was 04/15/2020.  Past Medical History:  Diagnosis Date  . AICD (automatic cardioverter/defibrillator) present    reports no shocks  - st Jude  . Ambulates with cane   . CAD (coronary artery disease)    a. 04/2013 Cath: LM 10, LAD 40p, 91m, D1 50p, LCX 60p, 79m/100m, OM1 50, OM2 100 L-L collats, RCA 100p;  b. CABG x 5: LIMA->LAD, VG->D2, VG->RI->OM2, VG->PDA.  . Cataracts, bilateral   . Chronic systolic CHF (congestive heart failure) ([place])    a. 04/2013 Echo: EF 15-20%, diff HK, sev HK of inf and ant myocardium, dilated LA,  b. EF 20-25% with restrictive filling pattern, mod RV dysfx, mild MR (10/10/2013);  c.  Echo 12/15: EF 25-30%  . COPD (chronic obstructive pulmonary disease) ([place])   . Diabetes mellitus without complication ([place])    unsure what type but was dx as an adult, takes no meds, check his cbg at home 2 times a week   . Emphysema   . GERD (gastroesophageal reflux disease)   . Gout   . Hyperlipidemia   . Hypertension   . Ischemic cardiomyopathy   . lung ca   . PVD (peripheral vascular disease) ([name])    a. (10/2013) ABIs: RIGHT 0.63, Waveforms: monophasic;  LEFT 0.78, Waveforms: monophasic  . Tobacco abuse    30+ pack-year history  . Transaminitis    a. 04/2013 Abd U/S and CT unremarkable, hepatitis panel neg -->felt to be 2/2 acute R heart failure.    Patient  Active Problem List   Diagnosis Date Noted  . Symptomatic anemia 11/14/2019  . Prolonged QT interval 11/14/2019  . GIB (gastrointestinal bleeding) 11/14/2019  . Hypokalemia 11/14/2019  . Chronic kidney disease, stage III (moderate) ([place]) 11/14/2019  . History of lung cancer 11/14/2019  . Anemia of chronic disease 08/09/2019  . Altered mental status 07/25/2019  . Peripheral neuropathy 07/25/2019  . Dehydration 06/27/2019  . Primary malignant neoplasm of bronchus of left upper lobe ([place]) 05/24/2019  . Stage III squamous cell carcinoma of left lung ([place]) 04/27/2019  . Encounter for antineoplastic chemotherapy 04/27/2019  . Goals of care, counseling/discussion 04/27/2019  . Avascular necrosis of bone of right hip ([place]) 11/30/2018  . Avascular necrosis of hip, right ([place]) 11/30/2018  . Chest pain 04/08/2015  . Abnormal nuclear stress test 04/08/2015  . Ischemic cardiomyopathy 07/06/2014  . Lumbago 03/13/2014  . Special screening for malignant neoplasms, colon 02/09/2014  . Normocytic anemia 02/09/2014  . Pleural effusion on left 11/28/2013  . Atherosclerosis of native arteries of extremity with intermittent claudication ([place]) 11/07/2013  . Weakness of both legs 11/07/2013  . Smoker 09/19/2013  . Numbness in right leg/ Foot 06/14/2013  . Pain in limb-Right Leg/foot 06/14/2013  . Atherosclerosis of native arteries of the extremities with ulceration(440.23) 06/14/2013  . Chronic systolic heart failure ([place]) 05/29/2013  . Atrial fibrillation ([place]) 05/29/2013  . Chest pain, mid sternal 05/19/2013  .  S/P CABG x 5 05/08/2013  . Right foot ulcer ([place]) 05/04/2013  . Coronary atherosclerosis of native coronary artery 04/28/2013  . Acute systolic heart failure ([place]) 04/28/2013  . Abnormal blood chemistry 12/30/2012  . Abnormal LFTs 12/30/2012  . Aspiration pneumonia ([place]) 12/30/2012  . Chronic congestive heart failure ([place]) 12/27/2012  . Leg weakness 12/26/2012  . Pneumonia 05/18/2011  .  Gout 05/17/2011  . Respiratory distress, acute 05/17/2011  . Hypertension 05/17/2011  . COPD (chronic obstructive pulmonary disease) ([place]) 05/17/2011  . CHF, acute ([place]) 05/17/2011    Past Surgical History:  Procedure Laterality Date  . APPENDECTOMY  as a child  . BIOPSY  11/16/2019   Procedure: BIOPSY;  Surgeon: Rush Landmark Telford Nab., MD;  Location: [facility];  Service: Gastroenterology;;  . CARDIAC CATHETERIZATION  04/08/2015   Procedure: Left Heart Cath and Cors/Grafts Angiography;  Surgeon: Peter M Martinique, MD;  Location: [facility];  Service: Cardiovascular;;  . COLONOSCOPY    . COLONOSCOPY WITH PROPOFOL N/A 11/16/2019   Procedure: COLONOSCOPY WITH PROPOFOL;  Surgeon: Rush Landmark Telford Nab., MD;  Location: [facility];  Service: Gastroenterology;  Laterality: N/A;  . CORONARY ARTERY BYPASS GRAFT N/A 05/05/2013   Procedure: CORONARY ARTERY BYPASS GRAFTING (CABG) TIMES FIVE USING LEFT INTERNAL MAMMARY ARTERY AND RIGHT AND LEFT SAPHENOUS LEG VEIN HARVESTED ENDOSCOPICALLY;  Surgeon: Melrose Nakayama, MD;  Location: [facility];  Service: Open Heart Surgery;  Laterality: N/A;  . ESOPHAGOGASTRODUODENOSCOPY (EGD) WITH PROPOFOL N/A 11/16/2019   Procedure: ESOPHAGOGASTRODUODENOSCOPY (EGD) WITH PROPOFOL;  Surgeon: Rush Landmark Telford Nab., MD;  Location: [facility];  Service: Gastroenterology;  Laterality: N/A;  . HEMOSTASIS CLIP PLACEMENT  11/16/2019   Procedure: HEMOSTASIS CLIP PLACEMENT;  Surgeon: Irving Copas., MD;  Location: [facility];  Service: Gastroenterology;;  . IMPLANTABLE CARDIOVERTER DEFIBRILLATOR IMPLANT N/A 07/06/2014   Procedure: St Jude IMPLANTABLE CARDIOVERTER DEFIBRILLATOR IMPLANT;  Surgeon: Deboraha Sprang, MD;  Location: [facility] CATH LAB;  Service: Cardiovascular;  Laterality: N/A;  . LEFT AND RIGHT HEART CATHETERIZATION WITH CORONARY ANGIOGRAM N/A 04/28/2013   Procedure: LEFT AND RIGHT HEART CATHETERIZATION WITH CORONARY ANGIOGRAM;  Surgeon: Burnell Blanks, MD;  Location[facility] CATH LAB;  Service: Cardiovascular;  Laterality: N/A;  . MULTIPLE EXTRACTIONS WITH ALVEOLOPLASTY N/A 05/03/2013   Procedure: Extraction of tooth #s 3,4,5,6,8,9,27 with alveoloplast and maxillary left osseous tuberosity reduction;  Surgeon: Lenn Cal, DDS;  Location: [facility];  Service: Oral Surgery;  Laterality: N/A;  . POLYPECTOMY  11/16/2019   Procedure: POLYPECTOMY;  Surgeon: Rush Landmark Telford Nab., MD;  Location: [facility];  Service: Gastroenterology;;  . TEE WITHOUT CARDIOVERSION N/A 09/01/2019   Procedure: TRANSESOPHAGEAL ECHOCARDIOGRAM (TEE);  Surgeon: Larey Dresser, MD;  Location: [facility] ENDOSCOPY;  Service: Cardiovascular;  Laterality: N/A;  . TOTAL HIP ARTHROPLASTY Right 11/30/2018   Procedure: TOTAL HIP ARTHROPLASTY ANTERIOR APPROACH;  Surgeon: Rod Can, MD;  Location: [facility] ORS;  Service: Orthopedics;  Laterality: Right;  Marland Kitchen VIDEO BRONCHOSCOPY WITH ENDOBRONCHIAL ULTRASOUND N/A 04/20/2019   Procedure: VIDEO BRONCHOSCOPY WITH ENDOBRONCHIAL ULTRASOUND;  Surgeon: Margaretha Seeds, MD;  Location: [facility] OR;  Service: Thoracic;  Laterality: N/A;    Current Outpatient Medications on File Prior to Visit  Medication Sig Dispense Refill  . albuterol (ACCUNEB) 1.25 MG/3ML nebulizer solution Take 1 ampule by nebulization every 6 (six) hours as needed for wheezing.    Marland Kitchen albuterol (VENTOLIN HFA) 108 (90 Base) MCG/ACT inhaler Inhale 1-2 puffs into the lungs every 6 (six) hours as needed for wheezing or shortness of breath.  6.7 g 6  . allopurinol (ZYLOPRIM) 300 MG tablet Take 300 mg by mouth daily.    Marland Kitchen atorvastatin (LIPITOR) 20 MG tablet Take 20 mg by mouth daily.    Marland Kitchen b complex vitamins tablet Take 1 tablet by mouth at bedtime.     . B Complex-C-Folic Acid (RENA-VITE RX) 1 MG TABS Take 1 tablet by mouth daily.    . chlorproMAZINE (THORAZINE) 25 MG tablet Take 1 tablet (25 mg total) by mouth 3 (three) times daily as needed. 30 tablet 0  . Cholecalciferol (VITAMIN  D3) 25 MCG (1000 UT) CAPS Take 1,000 Units by mouth daily at 12 noon.     . dapagliflozin propanediol (FARXIGA) 10 MG TABS tablet Take 1 tablet (10 mg total) by mouth daily before breakfast. 30 tablet 3  . DUREZOL 0.05 % EMUL     . ELIQUIS 5 MG TABS tablet Take 5 mg by mouth 2 (two) times daily.     . ferrous gluconate (FERGON) 324 MG tablet Take 1 tablet (324 mg total) by mouth daily with breakfast. 30 tablet 1  . furosemide (LASIX) 20 MG tablet Take 2 tablets (40 mg total) by mouth daily. 60 tablet 11  . gabapentin (NEURONTIN) 300 MG capsule     . guaiFENesin-dextromethorphan (ROBITUSSIN DM) 100-10 MG/5ML syrup Take 5 mLs by mouth every 4 (four) hours as needed for cough.     . hydrALAZINE (APRESOLINE) 25 MG tablet     . HYDROcodone-acetaminophen (NORCO) 10-325 MG tablet Take 1 tablet by mouth.    . Melatonin 3 MG TABS Take 3 mg by mouth at bedtime.    . metoprolol succinate (TOPROL-XL) 25 MG 24 hr tablet Take 1 tablet (25 mg total) by mouth daily. 30 tablet 11  . nitroGLYCERIN (NITROSTAT) 0.4 MG SL tablet Place 0.4 mg under the tongue every 5 (five) minutes as needed for chest pain.     Marland Kitchen ofloxacin (OCUFLOX) 0.3 % ophthalmic solution     . omeprazole (PRILOSEC) 40 MG capsule Take 40 mg by mouth daily.    . potassium chloride SA (KLOR-CON) 20 MEQ tablet Take 1 tablet (20 mEq total) by mouth daily. 2 tablet 0  . senna (SENOKOT) 8.6 MG TABS tablet Take 1 tablet (8.6 mg total) by mouth 2 (two) times daily. 120 each 0  . SPIRIVA HANDIHALER 18 MCG inhalation capsule Place 1 capsule into inhaler and inhale daily.    . sucralfate (CARAFATE) 1 g tablet Take 1 tablet (1 g total) by mouth 4 (four) times daily -  with meals and at bedtime. 5 min before meals for radiation induced esophagitis 120 tablet 2  . tiZANidine (ZANAFLEX) 4 MG capsule Take 4 mg by mouth 3 (three) times daily as needed for muscle spasms.     Marland Kitchen tiZANidine (ZANAFLEX) 4 MG tablet     . traMADol (ULTRAM) 50 MG tablet      No current  facility-administered medications on file prior to visit.     Allergies  Allergen Reactions  . Penicillins Nausea And Vomiting, Swelling and Other (See Comments)    Per Dr Audria Nine Via phone 12/27/12 0400 SWELLING REACTION UNSPECIFIED   Did it involve swelling of the face/tongue/throat, SOB, or low BP? Unknown Did it involve sudden or severe rash/hives, skin peeling, or any reaction on the inside of your mouth or nose? Unknown Did you need to seek medical attention at a hospital or doctor's office? Unknown When did it last happen?Childhood If all above answers are "  NO", may proceed with cephalosporin use.    Social History   Occupational History  . Occupation: Retired  Tobacco Use  . Smoking status: Former Smoker    Packs/day: 2.00    Years: 30.00    Pack years: 60.00    Types: Cigarettes    Start date: 1969    Quit date: 03/23/2019    Years since quitting: 1.0  . Smokeless tobacco: Never Used  . Tobacco comment: Pt states now smokes - half pack per day 04/05/19  Vaping Use  . Vaping Use: Never used  Substance and Sexual Activity  . Alcohol use: Not Currently  . Drug use: Yes    Types: Marijuana    Comment: last use 04/17/19  . Sexual activity: Not on file    Family History  Problem Relation Age of Onset  . Diabetes Mother   . Hypertension Father   . Hypertension Other   . Heart attack Other        Brothers  . Diabetes Brother   . Heart attack Brother   . Heart attack Brother   . Stroke Neg Hx     Immunization History  Administered Date(s) Administered  . Influenza Split 03/22/2013  . Influenza, High Dose Seasonal PF 03/20/2019  . Influenza,inj,quad, With Preservative 03/22/2017     Objective: There were no vitals filed for this visit.  Janet Decesare. is a pleasant 69 y.o. African American male in NAD. AAO X 3.  Vascular Examination: Capillary fill time to digits <3 seconds b/l lower extremities. Faintly palpable DP pulse(s) b/l lower  extremities. Nonpalpable PT pulse(s) b/l lower extremities. Pedal hair sparse. Lower extremity skin temperature gradient within normal limits. No pain with calf compression b/l. Trace edema noted b/l lower extremities.  Dermatological Examination: Pedal skin is thin shiny, atrophic b/l lower extremities. No open wounds bilaterally. No interdigital macerations bilaterally. Toenails 1-5 b/l elongated, discolored, dystrophic, thickened, crumbly with subungual debris and tenderness to dorsal palpation.  Musculoskeletal Examination: Normal muscle strength 5/5 to all lower extremity muscle groups bilaterally. No pain crepitus or joint limitation noted with ROM b/l. Hallux valgus with bunion deformity noted b/l lower extremities.  Footwear Assessment: Does the patient wear appropriate shoes? Yes. Does the patient need inserts/orthotics? No.  Neurological Examination: Protective sensation intact 5/5 intact bilaterally with 10g monofilament b/l. Vibratory sensation diminished b/l. Proprioception intact bilaterally.  Assessment: 1. Pain due to onychomycosis of toenails of both feet   2. Hallux valgus, acquired, bilateral   3. Type II diabetes mellitus with peripheral circulatory disorder (HCC)   4. Encounter for diabetic foot exam ([place])    ADA Risk Categorization: High Risk  Patient has one or more of the following: Loss of protective sensation Absent pedal pulses Severe Foot deformity History of foot ulcer  Plan: -Examined patient. -No new findings. No new orders. -Diabetic foot examination performed on today's visit. -Continue diabetic foot care principles. -Toenails 1-5 b/l were debrided in length and girth with sterile nail nippers and dremel without iatrogenic bleeding.  -Patient to report any pedal injuries to medical professional immediately. -Patient to continue soft, supportive shoe gear daily. -Patient/POA to call should there be question/concern in the interim.  Return in  about 3 months (around 07/19/2020) for diabetic foot care, toenail debridement w/corn(s)/callus(es).  Marzetta Board, DPM

## 2020-04-23 DIAGNOSIS — Z6830 Body mass index (BMI) 30.0-30.9, adult: Secondary | ICD-10-CM | POA: Diagnosis not present

## 2020-04-23 DIAGNOSIS — C3492 Malignant neoplasm of unspecified part of left bronchus or lung: Secondary | ICD-10-CM | POA: Diagnosis not present

## 2020-04-23 DIAGNOSIS — I509 Heart failure, unspecified: Secondary | ICD-10-CM | POA: Diagnosis not present

## 2020-04-23 DIAGNOSIS — R0789 Other chest pain: Secondary | ICD-10-CM | POA: Diagnosis not present

## 2020-04-24 ENCOUNTER — Other Ambulatory Visit (HOSPITAL_COMMUNITY): Payer: Medicare HMO

## 2020-04-26 ENCOUNTER — Other Ambulatory Visit: Payer: Self-pay

## 2020-04-26 ENCOUNTER — Ambulatory Visit (HOSPITAL_COMMUNITY)
Admission: RE | Admit: 2020-04-26 | Discharge: 2020-04-26 | Disposition: A | Discharge status: Home / Self Care | Payer: Medicare HMO | Source: Ambulatory Visit | Attending: Cardiology | Admitting: Cardiology

## 2020-04-26 DIAGNOSIS — I5022 Chronic systolic (congestive) heart failure: Secondary | ICD-10-CM

## 2020-04-26 LAB — BASIC METABOLIC PANEL
Anion gap: 14 (ref 5–15)
BUN: 20 mg/dL (ref 8–23)
CO2: 21 mmol/L — ABNORMAL LOW (ref 22–32)
Calcium: 9.5 mg/dL (ref 8.9–10.3)
Chloride: 107 mmol/L (ref 98–111)
Creatinine, Ser: 1.78 mg/dL — ABNORMAL HIGH (ref 0.61–1.24)
GFR, Estimated: 41 mL/min — ABNORMAL LOW (ref >60)
Glucose, Bld: 91 mg/dL (ref 70–99)
Potassium: 3.5 mmol/L (ref 3.5–5.1)
Sodium: 142 mmol/L (ref 135–145)

## 2020-05-01 ENCOUNTER — Telehealth: Payer: Self-pay | Admitting: Medical Oncology

## 2020-05-01 ENCOUNTER — Encounter: Payer: Self-pay | Admitting: Internal Medicine

## 2020-05-01 ENCOUNTER — Other Ambulatory Visit: Payer: Self-pay

## 2020-05-01 ENCOUNTER — Inpatient Hospital Stay: Payer: Medicare HMO | Attending: Internal Medicine | Admitting: Internal Medicine

## 2020-05-01 ENCOUNTER — Telehealth: Payer: Self-pay

## 2020-05-01 DIAGNOSIS — Z7901 Long term (current) use of anticoagulants: Secondary | ICD-10-CM | POA: Insufficient documentation

## 2020-05-01 DIAGNOSIS — R042 Hemoptysis: Secondary | ICD-10-CM | POA: Insufficient documentation

## 2020-05-01 DIAGNOSIS — Z9221 Personal history of antineoplastic chemotherapy: Secondary | ICD-10-CM | POA: Diagnosis not present

## 2020-05-01 DIAGNOSIS — C3490 Malignant neoplasm of unspecified part of unspecified bronchus or lung: Secondary | ICD-10-CM

## 2020-05-01 DIAGNOSIS — E1136 Type 2 diabetes mellitus with diabetic cataract: Secondary | ICD-10-CM | POA: Diagnosis not present

## 2020-05-01 DIAGNOSIS — R5383 Other fatigue: Secondary | ICD-10-CM | POA: Diagnosis not present

## 2020-05-01 DIAGNOSIS — I5022 Chronic systolic (congestive) heart failure: Secondary | ICD-10-CM | POA: Diagnosis not present

## 2020-05-01 DIAGNOSIS — E785 Hyperlipidemia, unspecified: Secondary | ICD-10-CM | POA: Diagnosis not present

## 2020-05-01 DIAGNOSIS — R079 Chest pain, unspecified: Secondary | ICD-10-CM | POA: Diagnosis not present

## 2020-05-01 DIAGNOSIS — M109 Gout, unspecified: Secondary | ICD-10-CM | POA: Insufficient documentation

## 2020-05-01 DIAGNOSIS — R1012 Left upper quadrant pain: Secondary | ICD-10-CM | POA: Insufficient documentation

## 2020-05-01 DIAGNOSIS — I110 Hypertensive heart disease with heart failure: Secondary | ICD-10-CM | POA: Insufficient documentation

## 2020-05-01 DIAGNOSIS — C3412 Malignant neoplasm of upper lobe, left bronchus or lung: Secondary | ICD-10-CM | POA: Diagnosis not present

## 2020-05-01 DIAGNOSIS — Z923 Personal history of irradiation: Secondary | ICD-10-CM | POA: Diagnosis not present

## 2020-05-01 DIAGNOSIS — Z79899 Other long term (current) drug therapy: Secondary | ICD-10-CM | POA: Diagnosis not present

## 2020-05-01 DIAGNOSIS — I2510 Atherosclerotic heart disease of native coronary artery without angina pectoris: Secondary | ICD-10-CM | POA: Diagnosis not present

## 2020-05-01 DIAGNOSIS — E1151 Type 2 diabetes mellitus with diabetic peripheral angiopathy without gangrene: Secondary | ICD-10-CM | POA: Insufficient documentation

## 2020-05-01 DIAGNOSIS — I5082 Biventricular heart failure: Secondary | ICD-10-CM | POA: Diagnosis not present

## 2020-05-01 DIAGNOSIS — I255 Ischemic cardiomyopathy: Secondary | ICD-10-CM | POA: Diagnosis not present

## 2020-05-01 DIAGNOSIS — J449 Chronic obstructive pulmonary disease, unspecified: Secondary | ICD-10-CM | POA: Diagnosis not present

## 2020-05-01 DIAGNOSIS — K219 Gastro-esophageal reflux disease without esophagitis: Secondary | ICD-10-CM | POA: Insufficient documentation

## 2020-05-01 NOTE — Progress Notes (Signed)
[facility] [phone]   [phone]  OFFICE PROGRESS NOTE  [name], PA-C [place] [address]  DIAGNOSIS: stage IIIc (T2 a, N3, M0) non-small cell lung cancer, squamous cell carcinoma diagnosed in October 2020 and presented with left upper lobe pleural-based mass in addition to left hilar and right paratracheal lymphadenopathy.  PRIOR THERAPY:Concurrent chemoradiation with weekly carboplatin for AUC of 2 and paclitaxel 45 mg/M2.  First dose was on May 08, 2019.  Status post 7 cycles.  He declined consolidation immunotherapy.  . CURRENT THERAPY: Observation.  INTERVAL HISTORY: [name] [name]. 69 y.o. male returns to the clinic today for follow-up visit. The patient is feeling fine except for the intermittent pain on the left side of the chest as well as left upper quadrant of the abdomen. He has this kind of pain for several months now. His last CT scan of the chest was unremarkable. He also has cough with occasional blood-tinged sputum. The patient has shortness of breath with exertion but he also has congestive heart failure. He denied having any current nausea, vomiting, diarrhea or constipation. He denied having any headache or visual changes. He is here today for evaluation and recommendation regarding his condition.  MEDICAL HISTORY: Past Medical History:  Diagnosis Date  . AICD (automatic cardioverter/defibrillator) present    reports no shocks  - st Jude  . Ambulates with cane   . CAD (coronary artery disease)    a. 04/2013 Cath: LM 10, LAD 40p, 5m, D1 50p, LCX 60p, 25m/100m, OM1 50, OM2 100 L-L collats, RCA 100p;  b. CABG x 5: LIMA->LAD, VG->D2, VG->RI->OM2, VG->PDA.  . Cataracts, bilateral   . Chronic systolic CHF (congestive heart failure) ([place])    a. 04/2013 Echo: EF 15-20%, diff HK, sev HK of inf and ant myocardium, dilated LA,  b. EF 20-25% with restrictive filling pattern, mod RV dysfx, mild MR  (10/10/2013);  c.  Echo 12/15: EF 25-30%  . COPD (chronic obstructive pulmonary disease) ([place])   . Diabetes mellitus without complication ([place])    unsure what type but was dx as an adult, takes no meds, check his cbg at home 2 times a week   . Emphysema   . GERD (gastroesophageal reflux disease)   . Gout   . Hyperlipidemia   . Hypertension   . Ischemic cardiomyopathy   . lung ca   . PVD (peripheral vascular disease) ([place])    a. (10/2013) ABIs: RIGHT 0.63, Waveforms: monophasic;  LEFT 0.78, Waveforms: monophasic  . Tobacco abuse    30+ pack-year history  . Transaminitis    a. 04/2013 Abd U/S and CT unremarkable, hepatitis panel neg -->felt to be 2/2 acute R heart failure.    ALLERGIES:  is allergic to penicillins.  MEDICATIONS:  Current Outpatient Medications  Medication Sig Dispense Refill  . albuterol (ACCUNEB) 1.25 MG/3ML nebulizer solution Take 1 ampule by nebulization every 6 (six) hours as needed for wheezing.    Marland Kitchen albuterol (VENTOLIN HFA) 108 (90 Base) MCG/ACT inhaler Inhale 1-2 puffs into the lungs every 6 (six) hours as needed for wheezing or shortness of breath. 6.7 g 6  . allopurinol (ZYLOPRIM) 300 MG tablet Take 300 mg by mouth daily.    Marland Kitchen atorvastatin (LIPITOR) 20 MG tablet Take 20 mg by mouth daily.    Marland Kitchen b complex vitamins tablet Take 1 tablet by mouth at bedtime.     . B Complex-C-Folic Acid (RENA-VITE RX) 1  MG TABS Take 1 tablet by mouth daily.    . chlorproMAZINE (THORAZINE) 25 MG tablet Take 1 tablet (25 mg total) by mouth 3 (three) times daily as needed. 30 tablet 0  . Cholecalciferol (VITAMIN D3) 25 MCG (1000 UT) CAPS Take 1,000 Units by mouth daily at 12 noon.     . dapagliflozin propanediol (FARXIGA) 10 MG TABS tablet Take 1 tablet (10 mg total) by mouth daily before breakfast. 30 tablet 3  . DUREZOL 0.05 % EMUL     . ELIQUIS 5 MG TABS tablet Take 5 mg by mouth 2 (two) times daily.     . ferrous gluconate (FERGON) 324 MG tablet Take 1 tablet (324 mg total) by  mouth daily with breakfast. 30 tablet 1  . furosemide (LASIX) 20 MG tablet Take 2 tablets (40 mg total) by mouth daily. 60 tablet 11  . gabapentin (NEURONTIN) 300 MG capsule     . guaiFENesin-dextromethorphan (ROBITUSSIN DM) 100-10 MG/5ML syrup Take 5 mLs by mouth every 4 (four) hours as needed for cough.     . hydrALAZINE (APRESOLINE) 25 MG tablet     . HYDROcodone-acetaminophen (NORCO) 10-325 MG tablet Take 1 tablet by mouth.    . Melatonin 3 MG TABS Take 3 mg by mouth at bedtime.    . metoprolol succinate (TOPROL-XL) 25 MG 24 hr tablet Take 1 tablet (25 mg total) by mouth daily. 30 tablet 11  . nitroGLYCERIN (NITROSTAT) 0.4 MG SL tablet Place 0.4 mg under the tongue every 5 (five) minutes as needed for chest pain.     Marland Kitchen ofloxacin (OCUFLOX) 0.3 % ophthalmic solution     . omeprazole (PRILOSEC) 40 MG capsule Take 40 mg by mouth daily.    . potassium chloride SA (KLOR-CON) 20 MEQ tablet Take 1 tablet (20 mEq total) by mouth daily. 2 tablet 0  . senna (SENOKOT) 8.6 MG TABS tablet Take 1 tablet (8.6 mg total) by mouth 2 (two) times daily. 120 each 0  . SPIRIVA HANDIHALER 18 MCG inhalation capsule Place 1 capsule into inhaler and inhale daily.    . sucralfate (CARAFATE) 1 g tablet Take 1 tablet (1 g total) by mouth 4 (four) times daily -  with meals and at bedtime. 5 min before meals for radiation induced esophagitis 120 tablet 2  . tiZANidine (ZANAFLEX) 4 MG capsule Take 4 mg by mouth 3 (three) times daily as needed for muscle spasms.     Marland Kitchen tiZANidine (ZANAFLEX) 4 MG tablet     . traMADol (ULTRAM) 50 MG tablet      No current facility-administered medications for this visit.    SURGICAL HISTORY:  Past Surgical History:  Procedure Laterality Date  . APPENDECTOMY  as a child  . BIOPSY  11/16/2019   Procedure: BIOPSY;  Surgeon: Rush Landmark Telford Nab., MD;  Location: [facility];  Service: Gastroenterology;;  . CARDIAC CATHETERIZATION  04/08/2015   Procedure: Left Heart Cath and Cors/Grafts  Angiography;  Surgeon: Peter M Martinique, MD;  Location: [facility] CV [facility];  Service: Cardiovascular;;  . COLONOSCOPY    . COLONOSCOPY WITH PROPOFOL N/A 11/16/2019   Procedure: COLONOSCOPY WITH PROPOFOL;  Surgeon: Rush Landmark Telford Nab., MD;  Location: [facility];  Service: Gastroenterology;  Laterality: N/A;  . CORONARY ARTERY BYPASS GRAFT N/A 05/05/2013   Procedure: CORONARY ARTERY BYPASS GRAFTING (CABG) TIMES FIVE USING LEFT INTERNAL MAMMARY ARTERY AND RIGHT AND LEFT SAPHENOUS LEG VEIN HARVESTED ENDOSCOPICALLY;  Surgeon: Melrose Nakayama, MD;  Location: [facility];  Service: Open  Heart Surgery;  Laterality: N/A;  . ESOPHAGOGASTRODUODENOSCOPY (EGD) WITH PROPOFOL N/A 11/16/2019   Procedure: ESOPHAGOGASTRODUODENOSCOPY (EGD) WITH PROPOFOL;  Surgeon: Rush Landmark Telford Nab., MD;  Location: [facility];  Service: Gastroenterology;  Laterality: N/A;  . HEMOSTASIS CLIP PLACEMENT  11/16/2019   Procedure: HEMOSTASIS CLIP PLACEMENT;  Surgeon: Irving Copas., MD;  Location: [facility];  Service: Gastroenterology;;  . IMPLANTABLE CARDIOVERTER DEFIBRILLATOR IMPLANT N/A 07/06/2014   Procedure: St Jude IMPLANTABLE CARDIOVERTER DEFIBRILLATOR IMPLANT;  Surgeon: Deboraha Sprang, MD;  Location: [facility] CATH LAB;  Service: Cardiovascular;  Laterality: N/A;  . LEFT AND RIGHT HEART CATHETERIZATION WITH CORONARY ANGIOGRAM N/A 04/28/2013   Procedure: LEFT AND RIGHT HEART CATHETERIZATION WITH CORONARY ANGIOGRAM;  Surgeon: Burnell Blanks, MD;  Location: [facility] CATH LAB;  Service: Cardiovascular;  Laterality: N/A;  . MULTIPLE EXTRACTIONS WITH ALVEOLOPLASTY N/A 05/03/2013   Procedure: Extraction of tooth #s 3,4,5,6,8,9,27 with alveoloplast and maxillary left osseous tuberosity reduction;  Surgeon: Lenn Cal, DDS;  Location: [facility];  Service: Oral Surgery;  Laterality: N/A;  . POLYPECTOMY  11/16/2019   Procedure: POLYPECTOMY;  Surgeon: Rush Landmark Telford Nab., MD;  Location: [facility];  Service:  Gastroenterology;;  . TEE WITHOUT CARDIOVERSION N/A 09/01/2019   Procedure: TRANSESOPHAGEAL ECHOCARDIOGRAM (TEE);  Surgeon: Larey Dresser, MD;  Location: [facility] ENDOSCOPY;  Service: Cardiovascular;  Laterality: N/A;  . TOTAL HIP ARTHROPLASTY Right 11/30/2018   Procedure: TOTAL HIP ARTHROPLASTY ANTERIOR APPROACH;  Surgeon: Rod Can, MD;  Location: [facility];  Service: Orthopedics;  Laterality: Right;  Marland Kitchen VIDEO BRONCHOSCOPY WITH ENDOBRONCHIAL ULTRASOUND N/A 04/20/2019   Procedure: VIDEO BRONCHOSCOPY WITH ENDOBRONCHIAL ULTRASOUND;  Surgeon: Margaretha Seeds, MD;  Location: [facility];  Service: Thoracic;  Laterality: N/A;    REVIEW OF SYSTEMS:  A comprehensive review of systems was negative except for: Constitutional: positive for fatigue Respiratory: positive for dyspnea on exertion and pleurisy/chest pain   PHYSICAL EXAMINATION: General appearance: alert, cooperative, fatigued and no distress Head: Normocephalic, without obvious abnormality, atraumatic Neck: no adenopathy, no JVD, supple, symmetrical, trachea midline and thyroid not enlarged, symmetric, no tenderness/mass/nodules Lymph nodes: Cervical, supraclavicular, and axillary nodes normal. Resp: clear to auscultation bilaterally Back: symmetric, no curvature. ROM normal. No CVA tenderness. Cardio: regular rate and rhythm, S1, S2 normal, no murmur, click, rub or gallop GI: soft, non-tender; bowel sounds normal; no masses,  no organomegaly Extremities: extremities normal, atraumatic, no cyanosis or edema  ECOG PERFORMANCE STATUS: 1 - Symptomatic but completely ambulatory   Blood pressure 110/76, pulse 95, temperature 98.5 F (36.9 C), temperature source Tympanic, resp. rate 20, height 5\' 7"  (1.702 m), weight 187 lb 4.8 oz (85 kg), SpO2 100 %.  LABORATORY DATA: Lab Results  Component Value Date   WBC 11.5 (H) 04/15/2020   HGB 8.2 (L) 04/15/2020   HCT 28.5 (L) 04/15/2020   MCV 90.8 04/15/2020   PLT 176 04/15/2020      Chemistry       Component Value Date/Time   NA 142 04/26/2020 1347   NA 140 08/23/2019 1020   K 3.5 04/26/2020 1347   CL 107 04/26/2020 1347   CO2 21 (L) 04/26/2020 1347   BUN 20 04/26/2020 1347   BUN 52 (H) 08/23/2019 1020   CREATININE 1.78 (H) 04/26/2020 1347   CREATININE 1.36 (H) 03/11/2020 1100   CREATININE 1.35 (H) 12/25/2015 1310      Component Value Date/Time   CALCIUM 9.5 04/26/2020 1347   ALKPHOS 79 03/11/2020 1100   AST 17 03/11/2020 1100   ALT 16  03/11/2020 1100   BILITOT 0.4 03/11/2020 1100       RADIOGRAPHIC STUDIES: No results found.  ASSESSMENT AND PLAN: This is a very pleasant 69 years old male with stage IIIc non-small cell lung cancer, squamous cell carcinoma presented with left upper lobe pleural-based mass in addition to left hilar and right paramediastinal lymphadenopathy. The patient underwent a course of concurrent chemoradiation with weekly carboplatin and paclitaxel status post 7 cycles.   He tolerated his treatment well except for odynophagia and fatigue. His scan showed partial response after the treatment but he declined consolidation immunotherapy.   He is currently on observation and feeling fine except for the persistent pain on the left rib cage as well as upper abdomen. I recommended for the patient to have repeat CT scan of the chest, abdomen and pelvis performed next week. He will come back for follow-up visit after the scan for further evaluation and recommendation regarding his condition. For the history of coronary artery disease and congestive heart failure, he will continue his routine follow-up visit and evaluation by his cardiologist. The patient was advised to call immediately if he has any other concerning symptoms in the interval. The patient voices understanding of current disease status and treatment options and is in agreement with the current care plan.  All questions were answered. The patient knows to call the clinic with any problems, questions  or concerns. We can certainly see the patient much sooner if necessary.  Disclaimer: This note was dictated with voice recognition software. Similar sounding words can inadvertently be transcribed and may not be corrected upon review.

## 2020-05-01 NOTE — Telephone Encounter (Addendum)
[place] with [facility] requesting an appt for pt with Dr. Julien Nordmann for persistent left upper CP. She indicated his cardiac and orthopedic work-up were negative.  Schedule message sent.

## 2020-05-01 NOTE — Telephone Encounter (Signed)
I returned [facility] call . I was on hold for 5 minutes and hung up.

## 2020-05-02 ENCOUNTER — Telehealth: Payer: Self-pay | Admitting: Medical Oncology

## 2020-05-02 NOTE — Telephone Encounter (Signed)
LVM for Delphine about pt plan for upcoming appts , need to pick up barium and to return my call.

## 2020-05-03 ENCOUNTER — Telehealth: Payer: Self-pay

## 2020-05-03 NOTE — Telephone Encounter (Signed)
Pt sister, Delphine, LM wanting to know where to pick up pts CT contrast. I advised her she can pick it up here at the [facility]. She expressed understanding.

## 2020-05-08 ENCOUNTER — Ambulatory Visit (HOSPITAL_COMMUNITY)
Admission: RE | Admit: 2020-05-08 | Discharge: 2020-05-08 | Disposition: A | Discharge status: Home / Self Care | Payer: Medicare HMO | Source: Ambulatory Visit | Attending: Pharmacist | Admitting: Pharmacist

## 2020-05-08 ENCOUNTER — Telehealth: Payer: Self-pay | Admitting: Medical Oncology

## 2020-05-08 ENCOUNTER — Other Ambulatory Visit: Payer: Self-pay

## 2020-05-08 VITALS — BP 108/68 | HR 93 | Wt 186.0 lb

## 2020-05-08 DIAGNOSIS — I255 Ischemic cardiomyopathy: Secondary | ICD-10-CM | POA: Diagnosis not present

## 2020-05-08 DIAGNOSIS — M25519 Pain in unspecified shoulder: Secondary | ICD-10-CM | POA: Diagnosis not present

## 2020-05-08 DIAGNOSIS — J449 Chronic obstructive pulmonary disease, unspecified: Secondary | ICD-10-CM | POA: Insufficient documentation

## 2020-05-08 DIAGNOSIS — Z79899 Other long term (current) drug therapy: Secondary | ICD-10-CM | POA: Insufficient documentation

## 2020-05-08 DIAGNOSIS — I2510 Atherosclerotic heart disease of native coronary artery without angina pectoris: Secondary | ICD-10-CM | POA: Diagnosis not present

## 2020-05-08 DIAGNOSIS — I480 Paroxysmal atrial fibrillation: Secondary | ICD-10-CM | POA: Insufficient documentation

## 2020-05-08 DIAGNOSIS — Z87891 Personal history of nicotine dependence: Secondary | ICD-10-CM | POA: Insufficient documentation

## 2020-05-08 DIAGNOSIS — Z951 Presence of aortocoronary bypass graft: Secondary | ICD-10-CM | POA: Diagnosis not present

## 2020-05-08 DIAGNOSIS — N183 Chronic kidney disease, stage 3 unspecified: Secondary | ICD-10-CM | POA: Insufficient documentation

## 2020-05-08 DIAGNOSIS — C3490 Malignant neoplasm of unspecified part of unspecified bronchus or lung: Secondary | ICD-10-CM | POA: Insufficient documentation

## 2020-05-08 DIAGNOSIS — E118 Type 2 diabetes mellitus with unspecified complications: Secondary | ICD-10-CM | POA: Insufficient documentation

## 2020-05-08 DIAGNOSIS — I5022 Chronic systolic (congestive) heart failure: Secondary | ICD-10-CM | POA: Insufficient documentation

## 2020-05-08 DIAGNOSIS — Z7901 Long term (current) use of anticoagulants: Secondary | ICD-10-CM | POA: Diagnosis not present

## 2020-05-08 MED ORDER — SPIRONOLACTONE 25 MG PO TABS: 12.5000 mg | ORAL_TABLET | Freq: Every day | ORAL | 3 refills | Status: AC

## 2020-05-08 NOTE — Telephone Encounter (Signed)
I told Delphine to cancel pt ride tomorrow for CT scan because it is not authorized and will not be done.

## 2020-05-08 NOTE — Progress Notes (Signed)
PCP: [name] [facility] Cardiac Surgeon: Dr Roxan Hockey Pulmonologist: Dr. Lamonte Sakai Cardiology: Dr. Aundra Dubin  HPI:  [name] is a59 y.o.with history of COPD, HTN, 3V CAD s/p CABG x 5 (CABG 04/2013: LIMA to LAD, SVG to second diagonal, SVG to ramus intermediate and obtuse marginal 2, SVG to posterior descending), ischemic cardiomyopathy, chronic systolic HF, DM2, PVD and tobacco abuse. St Jude ICD.   Lexiscan Cardiolite in 12/19 showed inferior infarct, no ischemia. Echo in 12/19 showed EF 35%, mild LVH, mildly decreased RV systolic function. ABIs in 12/19 were stable compared to the past.   In the fall of 2020, he was diagnosed with non-small cell lung cancer. He has been treated with radiation and carboplatin/paclitaxel. He lost over 20 lbs. He was admitted 1/[facility] at [facility] with ICD shocks. These were found to be inappropriate shocks probably due to atrial fibrillation. He is now on amiodarone and Eliquis.   TEE in 3/21 was done to assess mitral regurgitation, showing EF 25-30%, mildly decreased RV systolic function, 3+ likely ischemic MR. In 5/21, he had symptomatic anemia. EGD/colonoscopy done, no definite cause. ABIs in 5/21 were stable.   He was recently seen by Dr. Aundra Dubin on 01/24/2020 and was volume overloaded. Weight was up 6 lbs but his BPwas low in the 34L systolic. It appeared he had been taking both carvedilol and metoprolol succinate. Given his COPD, he was advised to stop carvedilol and to continue metoprolol succinate. Hydralazine was also discontinued. Furosemide was increased to 40 mg daily. Plan was to refer to paramedicine, but he did not qualify as he lives in [place]). He has a home health aid that assists him with meds.   He recently returned to HF Clinic for follow up on 03/05/20 with [name] Jester, PA-C. His weight was down 13 lbs from 204>>191 lbs. Hypotension resolved, BP was 112/64. PCP recently stopped most of his HF meds due to  worsening renal function and hyperkalemia. SW assisted with visit and confirmed that he was taken off furosemide, Farxiga, spironolactone, amlodipine, metoprolol and Entresto.   He reported that he was feeling better overall. Dyspnea had resolved. He complained of intermittent sharp left sided chest pain not worsened by exertion and exacerbated when sleeping on his left shoulder. Pain radiated to his back and was not associated with meals. EKG showed inferolateral ST changes. No active CP at time of appointment. His furosemide 40 mg daily and potassium chloride 20 mEq was restarted at this visit.  He returned for follow-up with pharmacist on 03/28/20. Patient was feeling great other than shoulder pain. Denied dizziness/lightheadedness but felt more tired when he was busy. He could walk 50 yards before feeling SOB. Weight at home was ~191 lbs. He was euvolemic with no LEE, PND or orthopnea. Was not limiting salt-intake regularly, so he was educated on the importance of low-salt diet. At this visit, metoprolol succinate 12.5 mg daily was started, and he was instructed to increase to 25 mg daily after 1 week.  On 04/15/20, he returned for follow-up with [name] Jester, PA. He was mildly fluid overloaded on OptiVol and had ReDS clip 39% which is higher end of normal. BP was low in the 937T systolic, but he denied orthostatic symptoms. Complained of mild dyspnea on exertion.  Today he returns to HF clinic for pharmacist medication titration. At last visit with APP, Farxiga 10 mg daily was restarted. He is feeling good overall but continues to have shoulder pain which is being followed by his PCP.  No dizziness/lightheadedness. Reports some fatigue but this is stable. No CP. His breathing is good and he is able to walk 50 yards before feeling SOB. Weight down 8 lbs since last clinic visit but still having 1+ bilateral LEE. Uses 2 pillows at night and reports occasional PND. Appetite has decreased a little but  he continues to follow a low-salt diet.  HF Medications: Metoprolol succinate 25 mg daily Farxiga 10 mg daily Furosemide 40 mg daily Potassium chloride 20 mEq daily  Has the patient been experiencing any side effects to the medications prescribed?  no  Does the patient have any problems obtaining medications due to transportation or finances?   No - [employer] Medicare/Medicaid  Understanding of regimen: fair Understanding of indications: fair Potential of compliance: good Patient understands to avoid NSAIDs. Patient understands to avoid decongestants.   Pertinent Lab Values (04/26/20): Marland Kitchen Serum creatinine 1.78, BUN 20, Potassium 3.5, Sodium 142   Vital Signs: . Weight: 186 lbs (last clinic weight: 194 lbs) . Blood pressure: 108/68  . Heart rate: 93   Assessment: 1. CAD s/p CABG: Had cath in 10/16 with patent grafts, medical management.  Cardiolite in 12/19 showed no ischemia (just prior infarction).  Repeat NST for atypical CP 9/21 showed no ischemia - Continue atorvastatin.  - No aspirin given Eliquis use.      2. Chronic systolic HF: Ischemic cardiomyopathy, St Jude ICD.  TEE (3/21) with EF 25-30%.  - NYHA II-III symptoms. Volume status remains mildly elevated with 1+ LEE. - Increase furosemide to 40 mg QAM and 20 mg QPM for 2 days, then continue 40 mg daily. - Continue potassium chloride 20 mEq daily - Continue metoprolol succinate 25 mg daily - Start spironolactone 12.5 mg daily. Repeat BMET in 1 week. Monitor renal function carefully.  - Continue Farxiga 10 mg daily - Off hydralazine due to hypotension. Off Entresto due to AKI.  3. COPD/smoking: He has now quit smoking.  4. PAD: stable ABIs in 5/21. He denies claudication.  - Continue atorvastatin.   - no longer smoking - no ASA w/ Eliquis use  5. CKD: Stage 3.   - Check BMET in 1 week 6. Atrial fibrillation: Paroxysmal.  RRR on exam today.   - Continue Eliquis 5 mg BID - Previously stopped amiodarone on 03/05/20 given  patient preference 7. Lung cancer: NSCLC.  Radiation is completed.   Plan: 1) Medication changes: Based on clinical presentation, vital signs and recent labs will start spironolactone 12.5 mg daily and increase furosemide to 40 mg QAM and 20 mg QPM for 2 days, then continue furosemide 40 mg daily. 2) Labs: BMET in 1 week at [name] office 3) Follow-up: 4-week follow-up in [place], PharmD PGY2 Cardiology Pharmacy Resident  [name] [name], PharmD, BCPS, [facility], CPP Heart Failure Clinic Pharmacist [phone]

## 2020-05-08 NOTE — Patient Instructions (Addendum)
It was a pleasure seeing you today!  MEDICATIONS: -We are changing your medications today  - Increase furosemide to 40 mg (2 tablets) every morning and 20 mg (1 tablet) every evening for 2 days, then decrease back to furosemide 40 mg (2 tablets) daily.  -Start spironolactone 12.5 mg (1/2 tablet) daily -Call if you have questions about your medications.  LABS: -Recheck labs in 1 week at [name] Office  NEXT APPOINTMENT: Return to clinic in 1 month with APP Clinic on 06/06/20  In general, to take care of your heart failure: -Limit your fluid intake to 2 Liters (half-gallon) per day.   -Limit your salt intake to ideally 2-3 grams (2000-3000 mg) per day. -Weigh yourself daily and record, and bring that "weight diary" to your next appointment.  (Weight gain of 2-3 pounds in 1 day typically means fluid weight.) -The medications for your heart are to help your heart and help you live longer.   -Please contact us before stopping any of your heart medications.  Call the clinic at [phone] with questions or to reschedule future appointments.

## 2020-05-09 ENCOUNTER — Ambulatory Visit (HOSPITAL_COMMUNITY): Payer: Medicare HMO

## 2020-05-10 ENCOUNTER — Telehealth: Payer: Self-pay | Admitting: Medical Oncology

## 2020-05-10 DIAGNOSIS — I4891 Unspecified atrial fibrillation: Secondary | ICD-10-CM | POA: Diagnosis not present

## 2020-05-10 DIAGNOSIS — R0789 Other chest pain: Secondary | ICD-10-CM | POA: Diagnosis not present

## 2020-05-10 DIAGNOSIS — C3492 Malignant neoplasm of unspecified part of left bronchus or lung: Secondary | ICD-10-CM | POA: Diagnosis not present

## 2020-05-10 DIAGNOSIS — Z6828 Body mass index (BMI) 28.0-28.9, adult: Secondary | ICD-10-CM | POA: Diagnosis not present

## 2020-05-10 DIAGNOSIS — I2510 Atherosclerotic heart disease of native coronary artery without angina pectoris: Secondary | ICD-10-CM | POA: Diagnosis not present

## 2020-05-10 NOTE — Telephone Encounter (Signed)
VM on Delphines phone that pt apt is cancelled with [facility] until after CT scan completed.

## 2020-05-13 ENCOUNTER — Inpatient Hospital Stay: Payer: Medicare HMO | Admitting: Internal Medicine

## 2020-05-13 ENCOUNTER — Telehealth: Payer: Self-pay | Admitting: Medical Oncology

## 2020-05-13 NOTE — Telephone Encounter (Signed)
Delphine called and I told her [facility] does not have appt today. His next appt will be after he has his CT scan.

## 2020-05-14 ENCOUNTER — Other Ambulatory Visit: Payer: Self-pay | Admitting: Pulmonary Disease

## 2020-05-20 ENCOUNTER — Other Ambulatory Visit: Payer: Self-pay | Admitting: Pulmonary Disease

## 2020-05-21 DIAGNOSIS — I5022 Chronic systolic (congestive) heart failure: Secondary | ICD-10-CM | POA: Diagnosis not present

## 2020-05-21 DIAGNOSIS — Z95810 Presence of automatic (implantable) cardiac defibrillator: Secondary | ICD-10-CM | POA: Insufficient documentation

## 2020-05-21 DIAGNOSIS — R079 Chest pain, unspecified: Secondary | ICD-10-CM | POA: Diagnosis not present

## 2020-05-21 DIAGNOSIS — I255 Ischemic cardiomyopathy: Secondary | ICD-10-CM | POA: Diagnosis not present

## 2020-05-21 DIAGNOSIS — I4891 Unspecified atrial fibrillation: Secondary | ICD-10-CM | POA: Diagnosis not present

## 2020-05-21 DIAGNOSIS — I110 Hypertensive heart disease with heart failure: Secondary | ICD-10-CM | POA: Diagnosis not present

## 2020-05-21 DIAGNOSIS — I2510 Atherosclerotic heart disease of native coronary artery without angina pectoris: Secondary | ICD-10-CM | POA: Diagnosis not present

## 2020-05-21 DIAGNOSIS — I219 Acute myocardial infarction, unspecified: Secondary | ICD-10-CM | POA: Diagnosis not present

## 2020-05-21 DIAGNOSIS — I2119 ST elevation (STEMI) myocardial infarction involving other coronary artery of inferior wall: Secondary | ICD-10-CM | POA: Diagnosis not present

## 2020-05-21 DIAGNOSIS — E1151 Type 2 diabetes mellitus with diabetic peripheral angiopathy without gangrene: Secondary | ICD-10-CM | POA: Diagnosis not present

## 2020-05-21 DIAGNOSIS — K922 Gastrointestinal hemorrhage, unspecified: Secondary | ICD-10-CM | POA: Diagnosis not present

## 2020-05-21 DIAGNOSIS — J449 Chronic obstructive pulmonary disease, unspecified: Secondary | ICD-10-CM | POA: Diagnosis not present

## 2020-05-21 DIAGNOSIS — N189 Chronic kidney disease, unspecified: Secondary | ICD-10-CM | POA: Diagnosis not present

## 2020-05-21 DIAGNOSIS — Z950 Presence of cardiac pacemaker: Secondary | ICD-10-CM | POA: Diagnosis not present

## 2020-05-21 DIAGNOSIS — I5032 Chronic diastolic (congestive) heart failure: Secondary | ICD-10-CM | POA: Diagnosis not present

## 2020-05-21 DIAGNOSIS — R9431 Abnormal electrocardiogram [ECG] [EKG]: Secondary | ICD-10-CM | POA: Diagnosis not present

## 2020-05-21 DIAGNOSIS — I509 Heart failure, unspecified: Secondary | ICD-10-CM | POA: Diagnosis not present

## 2020-05-21 DIAGNOSIS — R0602 Shortness of breath: Secondary | ICD-10-CM | POA: Diagnosis not present

## 2020-05-21 DIAGNOSIS — Z20822 Contact with and (suspected) exposure to covid-19: Secondary | ICD-10-CM | POA: Diagnosis not present

## 2020-05-21 DIAGNOSIS — R0603 Acute respiratory distress: Secondary | ICD-10-CM | POA: Diagnosis not present

## 2020-05-21 DIAGNOSIS — Z880 Allergy status to penicillin: Secondary | ICD-10-CM | POA: Diagnosis not present

## 2020-05-21 DIAGNOSIS — E1122 Type 2 diabetes mellitus with diabetic chronic kidney disease: Secondary | ICD-10-CM | POA: Diagnosis not present

## 2020-05-21 DIAGNOSIS — C3490 Malignant neoplasm of unspecified part of unspecified bronchus or lung: Secondary | ICD-10-CM | POA: Diagnosis not present

## 2020-05-21 DIAGNOSIS — I4949 Other premature depolarization: Secondary | ICD-10-CM | POA: Diagnosis not present

## 2020-05-21 DIAGNOSIS — N183 Chronic kidney disease, stage 3 unspecified: Secondary | ICD-10-CM | POA: Diagnosis not present

## 2020-05-21 DIAGNOSIS — I130 Hypertensive heart and chronic kidney disease with heart failure and stage 1 through stage 4 chronic kidney disease, or unspecified chronic kidney disease: Secondary | ICD-10-CM | POA: Diagnosis not present

## 2020-05-21 DIAGNOSIS — K219 Gastro-esophageal reflux disease without esophagitis: Secondary | ICD-10-CM | POA: Diagnosis not present

## 2020-05-21 DIAGNOSIS — I213 ST elevation (STEMI) myocardial infarction of unspecified site: Secondary | ICD-10-CM | POA: Diagnosis not present

## 2020-05-21 DIAGNOSIS — Z951 Presence of aortocoronary bypass graft: Secondary | ICD-10-CM | POA: Diagnosis not present

## 2020-05-22 DIAGNOSIS — K219 Gastro-esophageal reflux disease without esophagitis: Secondary | ICD-10-CM | POA: Diagnosis not present

## 2020-05-22 DIAGNOSIS — J449 Chronic obstructive pulmonary disease, unspecified: Secondary | ICD-10-CM | POA: Diagnosis not present

## 2020-05-22 DIAGNOSIS — R5381 Other malaise: Secondary | ICD-10-CM | POA: Diagnosis not present

## 2020-05-22 DIAGNOSIS — I255 Ischemic cardiomyopathy: Secondary | ICD-10-CM | POA: Diagnosis not present

## 2020-05-22 DIAGNOSIS — I340 Nonrheumatic mitral (valve) insufficiency: Secondary | ICD-10-CM | POA: Diagnosis not present

## 2020-05-22 DIAGNOSIS — E1122 Type 2 diabetes mellitus with diabetic chronic kidney disease: Secondary | ICD-10-CM | POA: Diagnosis not present

## 2020-05-22 DIAGNOSIS — Z4502 Encounter for adjustment and management of automatic implantable cardiac defibrillator: Secondary | ICD-10-CM | POA: Diagnosis not present

## 2020-05-22 DIAGNOSIS — E1151 Type 2 diabetes mellitus with diabetic peripheral angiopathy without gangrene: Secondary | ICD-10-CM | POA: Diagnosis not present

## 2020-05-22 DIAGNOSIS — I4891 Unspecified atrial fibrillation: Secondary | ICD-10-CM | POA: Diagnosis not present

## 2020-05-22 DIAGNOSIS — I517 Cardiomegaly: Secondary | ICD-10-CM | POA: Diagnosis not present

## 2020-05-22 DIAGNOSIS — I5032 Chronic diastolic (congestive) heart failure: Secondary | ICD-10-CM | POA: Diagnosis not present

## 2020-05-22 DIAGNOSIS — Z951 Presence of aortocoronary bypass graft: Secondary | ICD-10-CM | POA: Diagnosis not present

## 2020-05-22 DIAGNOSIS — I130 Hypertensive heart and chronic kidney disease with heart failure and stage 1 through stage 4 chronic kidney disease, or unspecified chronic kidney disease: Secondary | ICD-10-CM | POA: Diagnosis not present

## 2020-05-22 DIAGNOSIS — N183 Chronic kidney disease, stage 3 unspecified: Secondary | ICD-10-CM | POA: Diagnosis not present

## 2020-05-22 DIAGNOSIS — I2510 Atherosclerotic heart disease of native coronary artery without angina pectoris: Secondary | ICD-10-CM | POA: Diagnosis not present

## 2020-05-22 DIAGNOSIS — Z20822 Contact with and (suspected) exposure to covid-19: Secondary | ICD-10-CM | POA: Diagnosis not present

## 2020-05-22 DIAGNOSIS — J069 Acute upper respiratory infection, unspecified: Secondary | ICD-10-CM | POA: Diagnosis not present

## 2020-05-23 ENCOUNTER — Other Ambulatory Visit: Payer: Self-pay

## 2020-05-23 ENCOUNTER — Telehealth: Payer: Medicare HMO | Admitting: Internal Medicine

## 2020-05-23 DIAGNOSIS — Z95810 Presence of automatic (implantable) cardiac defibrillator: Secondary | ICD-10-CM

## 2020-05-23 DIAGNOSIS — I4891 Unspecified atrial fibrillation: Secondary | ICD-10-CM

## 2020-05-23 DIAGNOSIS — I5022 Chronic systolic (congestive) heart failure: Secondary | ICD-10-CM

## 2020-05-24 DIAGNOSIS — E785 Hyperlipidemia, unspecified: Secondary | ICD-10-CM | POA: Diagnosis not present

## 2020-05-24 DIAGNOSIS — I130 Hypertensive heart and chronic kidney disease with heart failure and stage 1 through stage 4 chronic kidney disease, or unspecified chronic kidney disease: Secondary | ICD-10-CM | POA: Diagnosis not present

## 2020-05-24 DIAGNOSIS — I208 Other forms of angina pectoris: Secondary | ICD-10-CM | POA: Diagnosis not present

## 2020-05-24 DIAGNOSIS — N189 Chronic kidney disease, unspecified: Secondary | ICD-10-CM | POA: Diagnosis not present

## 2020-05-24 DIAGNOSIS — E1122 Type 2 diabetes mellitus with diabetic chronic kidney disease: Secondary | ICD-10-CM | POA: Diagnosis not present

## 2020-05-24 DIAGNOSIS — I499 Cardiac arrhythmia, unspecified: Secondary | ICD-10-CM | POA: Diagnosis not present

## 2020-05-24 DIAGNOSIS — I4891 Unspecified atrial fibrillation: Secondary | ICD-10-CM | POA: Diagnosis not present

## 2020-05-24 DIAGNOSIS — I509 Heart failure, unspecified: Secondary | ICD-10-CM | POA: Diagnosis not present

## 2020-05-24 DIAGNOSIS — I255 Ischemic cardiomyopathy: Secondary | ICD-10-CM | POA: Diagnosis not present

## 2020-05-24 DIAGNOSIS — Z20822 Contact with and (suspected) exposure to covid-19: Secondary | ICD-10-CM | POA: Diagnosis not present

## 2020-05-24 DIAGNOSIS — J449 Chronic obstructive pulmonary disease, unspecified: Secondary | ICD-10-CM | POA: Diagnosis not present

## 2020-05-24 DIAGNOSIS — R918 Other nonspecific abnormal finding of lung field: Secondary | ICD-10-CM | POA: Diagnosis not present

## 2020-05-24 DIAGNOSIS — Z951 Presence of aortocoronary bypass graft: Secondary | ICD-10-CM | POA: Diagnosis not present

## 2020-05-24 DIAGNOSIS — I25110 Atherosclerotic heart disease of native coronary artery with unstable angina pectoris: Secondary | ICD-10-CM | POA: Diagnosis not present

## 2020-05-24 DIAGNOSIS — R079 Chest pain, unspecified: Secondary | ICD-10-CM | POA: Diagnosis not present

## 2020-05-24 DIAGNOSIS — I4510 Unspecified right bundle-branch block: Secondary | ICD-10-CM | POA: Diagnosis not present

## 2020-05-24 DIAGNOSIS — R0789 Other chest pain: Secondary | ICD-10-CM | POA: Diagnosis not present

## 2020-05-27 DIAGNOSIS — M25511 Pain in right shoulder: Secondary | ICD-10-CM | POA: Diagnosis not present

## 2020-05-27 DIAGNOSIS — E1140 Type 2 diabetes mellitus with diabetic neuropathy, unspecified: Secondary | ICD-10-CM | POA: Diagnosis not present

## 2020-05-27 DIAGNOSIS — I480 Paroxysmal atrial fibrillation: Secondary | ICD-10-CM | POA: Diagnosis not present

## 2020-05-27 DIAGNOSIS — J069 Acute upper respiratory infection, unspecified: Secondary | ICD-10-CM | POA: Diagnosis not present

## 2020-05-27 DIAGNOSIS — I2510 Atherosclerotic heart disease of native coronary artery without angina pectoris: Secondary | ICD-10-CM | POA: Diagnosis not present

## 2020-05-27 DIAGNOSIS — K219 Gastro-esophageal reflux disease without esophagitis: Secondary | ICD-10-CM | POA: Diagnosis not present

## 2020-05-27 DIAGNOSIS — Z7902 Long term (current) use of antithrombotics/antiplatelets: Secondary | ICD-10-CM | POA: Diagnosis not present

## 2020-05-27 DIAGNOSIS — Z7901 Long term (current) use of anticoagulants: Secondary | ICD-10-CM | POA: Diagnosis not present

## 2020-05-27 DIAGNOSIS — E785 Hyperlipidemia, unspecified: Secondary | ICD-10-CM | POA: Diagnosis not present

## 2020-05-27 DIAGNOSIS — J449 Chronic obstructive pulmonary disease, unspecified: Secondary | ICD-10-CM | POA: Diagnosis not present

## 2020-05-31 DIAGNOSIS — C3492 Malignant neoplasm of unspecified part of left bronchus or lung: Secondary | ICD-10-CM | POA: Diagnosis not present

## 2020-05-31 DIAGNOSIS — I959 Hypotension, unspecified: Secondary | ICD-10-CM | POA: Diagnosis not present

## 2020-05-31 DIAGNOSIS — I4891 Unspecified atrial fibrillation: Secondary | ICD-10-CM | POA: Diagnosis not present

## 2020-05-31 DIAGNOSIS — R0789 Other chest pain: Secondary | ICD-10-CM | POA: Diagnosis not present

## 2020-05-31 DIAGNOSIS — I509 Heart failure, unspecified: Secondary | ICD-10-CM | POA: Diagnosis not present

## 2020-05-31 DIAGNOSIS — I2510 Atherosclerotic heart disease of native coronary artery without angina pectoris: Secondary | ICD-10-CM | POA: Diagnosis not present

## 2020-05-31 DIAGNOSIS — Z6826 Body mass index (BMI) 26.0-26.9, adult: Secondary | ICD-10-CM | POA: Diagnosis not present

## 2020-05-31 DIAGNOSIS — E119 Type 2 diabetes mellitus without complications: Secondary | ICD-10-CM | POA: Diagnosis not present

## 2020-05-31 DIAGNOSIS — Z79899 Other long term (current) drug therapy: Secondary | ICD-10-CM | POA: Diagnosis not present

## 2020-06-03 ENCOUNTER — Telehealth: Payer: Self-pay | Admitting: Medical Oncology

## 2020-06-03 DIAGNOSIS — J449 Chronic obstructive pulmonary disease, unspecified: Secondary | ICD-10-CM | POA: Diagnosis not present

## 2020-06-03 DIAGNOSIS — Z7901 Long term (current) use of anticoagulants: Secondary | ICD-10-CM | POA: Diagnosis not present

## 2020-06-03 DIAGNOSIS — E785 Hyperlipidemia, unspecified: Secondary | ICD-10-CM | POA: Diagnosis not present

## 2020-06-03 DIAGNOSIS — M25511 Pain in right shoulder: Secondary | ICD-10-CM | POA: Diagnosis not present

## 2020-06-03 DIAGNOSIS — I2510 Atherosclerotic heart disease of native coronary artery without angina pectoris: Secondary | ICD-10-CM | POA: Diagnosis not present

## 2020-06-03 DIAGNOSIS — E1140 Type 2 diabetes mellitus with diabetic neuropathy, unspecified: Secondary | ICD-10-CM | POA: Diagnosis not present

## 2020-06-03 DIAGNOSIS — I480 Paroxysmal atrial fibrillation: Secondary | ICD-10-CM | POA: Diagnosis not present

## 2020-06-03 DIAGNOSIS — K219 Gastro-esophageal reflux disease without esophagitis: Secondary | ICD-10-CM | POA: Diagnosis not present

## 2020-06-03 DIAGNOSIS — J069 Acute upper respiratory infection, unspecified: Secondary | ICD-10-CM | POA: Diagnosis not present

## 2020-06-03 NOTE — Telephone Encounter (Signed)
I received a saved  voice mail dated 05/31/20 and that pt needs to be seen for his "Chest pain related to his cancer".

## 2020-06-04 NOTE — Progress Notes (Signed)
Patient ID: Amyr Sluder., male   DOB: [birth year], 69 y.o.   MRN: [id number] PCP: [name] [facility]) Cardiac Surgeon: Dr Roxan Hockey Pulmonologist: Dr. Lamonte Sakai Cardiology: Dr. Aundra Dubin  HPI: Mr. Fudala is a 69 y.o. with history of COPD, HTN, 3V CAD s/p CABG x 5  (CABG 04/2013: LIMA to LAD, SVG to second diagonal, SVG to ramus intermediate and obtuse marginal 2, SVG to posterior descending), ischemic cardiomyopathy, chronic systolic HF, COPD, DM2, PVD and tobacco abuse. St Jude ICD.   Lexiscan Cardiolite in 12/19 showed inferior infarct, no ischemia.  Echo in 12/19 showed EF 35%, mild LVH, mildly decreased RV systolic function.  ABIs in 12/19 were stable compared to the past.    In the fall of 2020, he was diagnosed with non-small cell lung cancer.  He has been treated with radiation and carboplatin/palclitaxel.  He has lost over 20 lbs.  He was admitted 1/21 at [facility] with ICD shocks.  These were found to be inappropriate shocks probably due to atrial fibrillation.  He is now on amiodarone and Eliquis.    TEE in 3/21 was done to assess mitral regurgitation, showing EF 25-30%, mildly decreased RV systolic function, 3+ likely ischemic MR.  In 5/21, he had symptomatic anemia.  EGD/colonoscopy done, no definite cause. ABIs in 5/21 were stable.   He was recently seen by Dr. Aundra Dubin 8/4 and was volume overloaded. Wt was up 6 lb but his BP was low in the 33I systolic. It appeared he had been taking both coreg and Toprol XL. Given his COPD, he was advised to stop Coreg and to continue Toprol XL. Hydralazine was also discontinued. Lasix was increased to 40 mg daily. Plan was to refer to paramedicine, but did not qualify as he lives in [place] He has a home health aid that assist around his home.  Per his report, it sounds like she assists him with meds.   He was seen in the HF clinic 04/15/20 and remained volume overloaded.Upon investigation, it appeared his PCP had recently  stopped most of his HF meds due to worsening renal function and hyperkalemia. He had been taken off Lasix, Farxiga, spironolactone, amlodipine, metoprolol and Entresto. Given CAD history, he was ordered to get a NST. This showed no ischemia.   Admitted to [facility] 05/21/20 for chest pain. LHC on 11/30 w/ no evidence of new lesions but small vessel disease. Echo completed and showed EF 20 %. Discharged to home the same day.   He returned to the ED on 05/24/2020 with chest pain. HS Trop negative. He was continued on lasix and imdur was increased to 60 mg daily. Discharged the same day.    Today he returns for HF follow up. Complaining of cough and fatigue. Says he has noticed some blood in his sputum but this has been going on for many months. He has contacted his oncologist has an appointment last today. Overall feeling terrible. Continues to have intermittent chest pain. SOB with exertion.  Says he can walk short distances. Denies PND/Orthopnea. Unable to weigh at home. He has been staying with his sister. Appetite ok. No fever or chills.  Taking all medications but his PCP stopped imdur. Followed by [facility].   Scheduled for repeat CT chest later today.    Labs (11/19): K 4.6, creatinine 2.05 Labs(12/19): K 4.3, creatinine 2.07, LDL 57  Labs (9/20): K 4.2, creatinine 2.09, LDL 50 Labs (2/21): TSH normal, K 3.9, creatinine 1.92, AST 56, ALT  176 Labs (3/21): K 3.4, creatinine 1.57, hgb 10.7, LFTs normal Labs (6/21): K 4.1, creatinine 1.81, hgb 9.1, LFTs normal.  Labs (8/321): K 4.1, creatinine 2.11, TSH normal, AST 52, ALT 65  Labs (01/25/20): K 5.1, creatinine 1.75  Labs (02/02/20): K 5.3, creatinine 2.51 Labs (02/09/20): K 5.1, creatinine 2.27 Labs (04/26/2020): K 3.5 Creatinine 1.78.     PMH: 1. Chronic systolic CHF: Ischemic cardiomyopathy.  St Jude ICD.  - ECHO 06/26/13 EF 20-25% RV mild HK - ECHO 10/10/13 EF 20-25% restrictive filling pattern. Moderate RV dysfunction. Mild MR. -  Cardiolite (10/16) with EF 19%, anterior ischemia.  - Echo 7/17 EF 30-35% - Echo (12/19): EF 35%, mild LVH, basal-mid inferior akinesis, mildly decreased RV systolic function, mild MR.  - TEE (3/21): EF 25-30%, mild LV dilation, mildly decreased RV systolic function, 3+ MR with restricted posterior leaflet, PISA ERO 0.22 cm^2.  2. CAD: CABG x 5 11/14 with LIMA-LAD, SVG-D2, seq SVG-ramus/OM2, SVG-PDA.  - LHC (10/16) with patent LIMA-LAD and 60% pLAD stenosis (competitive flow). SVG-D, sequential SVG-ramus/OM2, SVG-PDA all patent. Medical management.  - Lexiscan Cardiolite in 12/19 showed inferior infarction, no ischemia.  - LHC 20 3. COPD: Has not quit smoking.   4. PAD:  - ABIs 11/07/13: RABI .63 moderate arterial occlusive dz,  LABI .78 moderate arterial occlusive dz - ABIs 9/16: RABI .68, LABI 0.75 - ABIs 12/19: right ABI 0.75, left ABI 0.7 - ABIs 5/21: 0.7 right, 0.69 left.  5. Type 2 diabetes 6. Hyperlipidemia 7. Osteoarthritis: Left THR in 6/20.  8. Gout  9. Hyperlipidemia 10. CKD: Stage 3.  11. Non-small cell lung cancer: Diagnosed 10/20, treated with carboplatin/paclitaxel + radiation.   12. Atrial fibrillation: Paroxysmal.  13. Mitral regurgitation: Suspect ischemic MR with restricted posterior leaflet.  3+ MR on 3/21 TEE.   SH: Lives with his girlfriend.  Quit smoking in 2020. Does not drink alcohol.   FH: 2 brothers died from heart attacks, Mother DM, Dad HTN   ROS: All systems negative except as listed in HPI, PMH and Problem List.  Current Outpatient Medications  Medication Sig Dispense Refill  . albuterol (ACCUNEB) 1.25 MG/3ML nebulizer solution Take 1 ampule by nebulization every 6 (six) hours as needed for wheezing.    Marland Kitchen albuterol (VENTOLIN HFA) 108 (90 Base) MCG/ACT inhaler Inhale 1-2 puffs into the lungs every 6 (six) hours as needed for wheezing or shortness of breath. Pt needs appt for further refills. 18 each 0  . allopurinol (ZYLOPRIM) 300 MG tablet Take 300 mg  by mouth daily.    Marland Kitchen atorvastatin (LIPITOR) 20 MG tablet Take 20 mg by mouth daily.    Marland Kitchen b complex vitamins tablet Take 1 tablet by mouth at bedtime.     . chlorproMAZINE (THORAZINE) 25 MG tablet Take 1 tablet (25 mg total) by mouth 3 (three) times daily as needed. 30 tablet 0  . Cholecalciferol (VITAMIN D3) 25 MCG (1000 UT) CAPS Take 1,000 Units by mouth daily at 12 noon.     . dapagliflozin propanediol (FARXIGA) 10 MG TABS tablet Take 1 tablet (10 mg total) by mouth daily before breakfast. 30 tablet 3  . DUREZOL 0.05 % EMUL     . ELIQUIS 5 MG TABS tablet Take 5 mg by mouth 2 (two) times daily.     . ferrous gluconate (FERGON) 324 MG tablet Take 1 tablet (324 mg total) by mouth daily with breakfast. 30 tablet 1  . furosemide (LASIX) 20 MG tablet Take 2 tablets (  40 mg total) by mouth daily. 60 tablet 11  . gabapentin (NEURONTIN) 300 MG capsule 300 mg 3 (three) times daily.     Marland Kitchen guaiFENesin-dextromethorphan (ROBITUSSIN DM) 100-10 MG/5ML syrup Take 5 mLs by mouth every 4 (four) hours as needed for cough.     . Melatonin 3 MG TABS Take 3 mg by mouth at bedtime.    . metoprolol succinate (TOPROL-XL) 25 MG 24 hr tablet Take 1 tablet (25 mg total) by mouth daily. 30 tablet 11  . nitroGLYCERIN (NITROSTAT) 0.4 MG SL tablet Place 0.4 mg under the tongue every 5 (five) minutes as needed for chest pain.     Marland Kitchen ofloxacin (OCUFLOX) 0.3 % ophthalmic solution     . omeprazole (PRILOSEC) 40 MG capsule Take 40 mg by mouth daily.    . Oxycodone HCl 10 MG TABS     . potassium chloride SA (KLOR-CON) 20 MEQ tablet Take 1 tablet (20 mEq total) by mouth daily. 2 tablet 0  . senna (SENOKOT) 8.6 MG TABS tablet Take 1 tablet (8.6 mg total) by mouth 2 (two) times daily. 120 each 0  . SPIRIVA HANDIHALER 18 MCG inhalation capsule Place 1 capsule into inhaler and inhale daily.    Marland Kitchen spironolactone (ALDACTONE) 25 MG tablet Take 0.5 tablets (12.5 mg total) by mouth daily. 45 tablet 3  . sucralfate (CARAFATE) 1 g tablet Take 1  tablet (1 g total) by mouth 4 (four) times daily -  with meals and at bedtime. 5 min before meals for radiation induced esophagitis 120 tablet 2  . tiZANidine (ZANAFLEX) 4 MG capsule Take 4 mg by mouth 3 (three) times daily as needed for muscle spasms.      No current facility-administered medications for this encounter.    Vitals:   06/06/20 0940  BP: 108/88  Pulse: (!) 106  SpO2: 100%  Weight: 79.8 kg (176 lb)    Wt Readings from Last 3 Encounters:  06/06/20 79.8 kg (176 lb)  05/08/20 84.4 kg (186 lb)  05/01/20 85 kg (187 lb 4.8 oz)   PHYSICAL EXAM: General:  Appears chronically ill.  No resp difficulty HEENT: normal Neck: supple. JVP 9-10 . Carotids 2+ bilat; no bruits. No lymphadenopathy or thryomegaly appreciated. Cor: PMI nondisplaced. Regular rate & rhythm. No rubs, gallops or murmurs. Lungs: clear Abdomen: soft, nontender, nondistended. No hepatosplenomegaly. No bruits or masses. Good bowel sounds. Extremities: no cyanosis, clubbing, rash, cool lower extremities R and LLE 1-2+  Edema. L foot partial thickness skin loss 4x3 cm and 2x2 cm on the top of his foot. Able to doppler pulses.  Neuro: alert & orientedx3, cranial nerves grossly intact. moves all 4 extremities w/o difficulty. Affect pleasant  EKG: 107 bpm narrow QRS on arrival.   ASSESSMENT & PLAN:  1. CAD s/p CABG: Had cath in 10/16 with patent grafts, medical management.  Cardiolite in 12/19 showed no ischemia (just prior infarction).  Repeat NST for atypical CP 9/21 showed no ischemia - Had LHC at [facility] 05/21/20 this showed patent grafts.  - Restart imdur today 30 mg twice a day.  - continue medical therapy  - Continue statin.  - No ASA given Eliquis use.     2. Chronic systolic HF: Ischemic cardiomyopathy, St Jude ICD.  TEE (3/21) with EF 25-30%.  -NYHA III. Volume status elevated. Increase lasix to 40 mg twice a day. Check BMET today and in 7 days.  --Continue Farxiga 10 mg daily  - Intolerant entresto due to  hypotension/aki -  continue Toprol XL 25 mg daily  - off hydralazine due to hypotension  3. COPD/smoking: He has now quit smoking.  4. PAD: stable ABIs in 5/21. - Able to doppler pedal/posterior tib pulses.  - Continue statin.   - no longer smoking - no ASA w/ eliquis use  5. CKD: Stage 3.   Check BMET today and in 7 days.  6. Atrial fibrillation: Paroxysmal.  - Continue Eliquis.  - In SR today.  7. Lung cancer: NSCLC.  Radiation is completed. He has follow today with CT.   8. R and LLE edema/wound on R foot -Will cover R foot wound with foam dressing.  - I personally applied ace wrap to R and LLE.  - Ask HH to follow and help with leg wraps.   Follow up in 3 weeks to reassess.   Darrick Grinder, NP-C  06/06/2020

## 2020-06-05 ENCOUNTER — Ambulatory Visit (HOSPITAL_COMMUNITY): Admission: RE | Admit: 2020-06-05 | Payer: Medicare HMO | Source: Ambulatory Visit

## 2020-06-05 ENCOUNTER — Telehealth: Payer: Self-pay | Admitting: Medical Oncology

## 2020-06-05 ENCOUNTER — Other Ambulatory Visit: Payer: Self-pay | Admitting: Medical Oncology

## 2020-06-05 DIAGNOSIS — C3490 Malignant neoplasm of unspecified part of unspecified bronchus or lung: Secondary | ICD-10-CM

## 2020-06-05 DIAGNOSIS — C3492 Malignant neoplasm of unspecified part of left bronchus or lung: Secondary | ICD-10-CM

## 2020-06-05 NOTE — Telephone Encounter (Addendum)
Delphine notified that [name] [name] will see Lucianne Lei and then get  CT scan  tomorrow . He needs to arrive in radiology at 3 pm to drink contrast, then be scanned at 5 pm.

## 2020-06-05 NOTE — Telephone Encounter (Signed)
[facility] ED is requesting a f/u appt with pt for his pain. Per [name] in significant pain in his chest . Pain med only lasts 3 hours . "He is crying in pain".  Appt with [name] [name] tomorrow.  CT scan w/o contrast  ordered stat -no labs needed-done yesterday and high creatinine

## 2020-06-06 ENCOUNTER — Ambulatory Visit (HOSPITAL_COMMUNITY)
Admission: RE | Admit: 2020-06-06 | Discharge: 2020-06-06 | Disposition: A | Discharge status: Home / Self Care | Payer: Medicare HMO | Source: Ambulatory Visit | Attending: Internal Medicine | Admitting: Internal Medicine

## 2020-06-06 ENCOUNTER — Ambulatory Visit (HOSPITAL_COMMUNITY)
Admission: RE | Admit: 2020-06-06 | Discharge: 2020-06-06 | Disposition: A | Discharge status: Home / Self Care | Payer: Medicare HMO | Source: Ambulatory Visit | Attending: Adult Health | Admitting: Adult Health

## 2020-06-06 ENCOUNTER — Encounter (HOSPITAL_COMMUNITY): Payer: Self-pay

## 2020-06-06 ENCOUNTER — Other Ambulatory Visit: Payer: Self-pay

## 2020-06-06 ENCOUNTER — Inpatient Hospital Stay: Payer: Medicare HMO | Attending: Internal Medicine | Admitting: Medical

## 2020-06-06 ENCOUNTER — Telehealth (HOSPITAL_COMMUNITY): Payer: Self-pay

## 2020-06-06 ENCOUNTER — Other Ambulatory Visit: Payer: Self-pay | Admitting: Medical Oncology

## 2020-06-06 VITALS — BP 100/74 | HR 105 | Temp 97.2°F | Resp 22 | Ht 67.0 in | Wt 176.8 lb

## 2020-06-06 VITALS — BP 108/88 | HR 106 | Wt 176.0 lb

## 2020-06-06 DIAGNOSIS — K219 Gastro-esophageal reflux disease without esophagitis: Secondary | ICD-10-CM | POA: Insufficient documentation

## 2020-06-06 DIAGNOSIS — I491 Atrial premature depolarization: Secondary | ICD-10-CM | POA: Diagnosis not present

## 2020-06-06 DIAGNOSIS — R600 Localized edema: Secondary | ICD-10-CM

## 2020-06-06 DIAGNOSIS — Z923 Personal history of irradiation: Secondary | ICD-10-CM | POA: Diagnosis not present

## 2020-06-06 DIAGNOSIS — Z7901 Long term (current) use of anticoagulants: Secondary | ICD-10-CM | POA: Insufficient documentation

## 2020-06-06 DIAGNOSIS — I2510 Atherosclerotic heart disease of native coronary artery without angina pectoris: Secondary | ICD-10-CM | POA: Insufficient documentation

## 2020-06-06 DIAGNOSIS — E785 Hyperlipidemia, unspecified: Secondary | ICD-10-CM | POA: Diagnosis not present

## 2020-06-06 DIAGNOSIS — N183 Chronic kidney disease, stage 3 unspecified: Secondary | ICD-10-CM

## 2020-06-06 DIAGNOSIS — G8929 Other chronic pain: Secondary | ICD-10-CM | POA: Diagnosis not present

## 2020-06-06 DIAGNOSIS — I5022 Chronic systolic (congestive) heart failure: Secondary | ICD-10-CM | POA: Diagnosis not present

## 2020-06-06 DIAGNOSIS — Z9225 Personal history of immunosupression therapy: Secondary | ICD-10-CM | POA: Insufficient documentation

## 2020-06-06 DIAGNOSIS — R0789 Other chest pain: Secondary | ICD-10-CM

## 2020-06-06 DIAGNOSIS — I4510 Unspecified right bundle-branch block: Secondary | ICD-10-CM | POA: Diagnosis not present

## 2020-06-06 DIAGNOSIS — Z9221 Personal history of antineoplastic chemotherapy: Secondary | ICD-10-CM | POA: Insufficient documentation

## 2020-06-06 DIAGNOSIS — Z79899 Other long term (current) drug therapy: Secondary | ICD-10-CM | POA: Insufficient documentation

## 2020-06-06 DIAGNOSIS — Z7984 Long term (current) use of oral hypoglycemic drugs: Secondary | ICD-10-CM | POA: Insufficient documentation

## 2020-06-06 DIAGNOSIS — C3492 Malignant neoplasm of unspecified part of left bronchus or lung: Secondary | ICD-10-CM | POA: Insufficient documentation

## 2020-06-06 DIAGNOSIS — I255 Ischemic cardiomyopathy: Secondary | ICD-10-CM | POA: Insufficient documentation

## 2020-06-06 DIAGNOSIS — Z95810 Presence of automatic (implantable) cardiac defibrillator: Secondary | ICD-10-CM | POA: Insufficient documentation

## 2020-06-06 DIAGNOSIS — Z87891 Personal history of nicotine dependence: Secondary | ICD-10-CM | POA: Insufficient documentation

## 2020-06-06 DIAGNOSIS — Z8249 Family history of ischemic heart disease and other diseases of the circulatory system: Secondary | ICD-10-CM | POA: Diagnosis not present

## 2020-06-06 DIAGNOSIS — C3490 Malignant neoplasm of unspecified part of unspecified bronchus or lung: Secondary | ICD-10-CM

## 2020-06-06 DIAGNOSIS — E1151 Type 2 diabetes mellitus with diabetic peripheral angiopathy without gangrene: Secondary | ICD-10-CM | POA: Diagnosis not present

## 2020-06-06 DIAGNOSIS — J449 Chronic obstructive pulmonary disease, unspecified: Secondary | ICD-10-CM | POA: Insufficient documentation

## 2020-06-06 DIAGNOSIS — I110 Hypertensive heart disease with heart failure: Secondary | ICD-10-CM | POA: Diagnosis not present

## 2020-06-06 DIAGNOSIS — E1122 Type 2 diabetes mellitus with diabetic chronic kidney disease: Secondary | ICD-10-CM | POA: Diagnosis not present

## 2020-06-06 DIAGNOSIS — I480 Paroxysmal atrial fibrillation: Secondary | ICD-10-CM | POA: Insufficient documentation

## 2020-06-06 DIAGNOSIS — I5082 Biventricular heart failure: Secondary | ICD-10-CM | POA: Insufficient documentation

## 2020-06-06 DIAGNOSIS — I452 Bifascicular block: Secondary | ICD-10-CM | POA: Insufficient documentation

## 2020-06-06 DIAGNOSIS — C3412 Malignant neoplasm of upper lobe, left bronchus or lung: Secondary | ICD-10-CM | POA: Insufficient documentation

## 2020-06-06 DIAGNOSIS — I130 Hypertensive heart and chronic kidney disease with heart failure and stage 1 through stage 4 chronic kidney disease, or unspecified chronic kidney disease: Secondary | ICD-10-CM | POA: Insufficient documentation

## 2020-06-06 DIAGNOSIS — E1136 Type 2 diabetes mellitus with diabetic cataract: Secondary | ICD-10-CM | POA: Diagnosis not present

## 2020-06-06 DIAGNOSIS — S81801A Unspecified open wound, right lower leg, initial encounter: Secondary | ICD-10-CM

## 2020-06-06 DIAGNOSIS — Z951 Presence of aortocoronary bypass graft: Secondary | ICD-10-CM | POA: Insufficient documentation

## 2020-06-06 DIAGNOSIS — I340 Nonrheumatic mitral (valve) insufficiency: Secondary | ICD-10-CM | POA: Diagnosis not present

## 2020-06-06 LAB — BASIC METABOLIC PANEL
Anion gap: 19 — ABNORMAL HIGH (ref 5–15)
BUN: 43 mg/dL — ABNORMAL HIGH (ref 8–23)
CO2: 20 mmol/L — ABNORMAL LOW (ref 22–32)
Calcium: 10.1 mg/dL (ref 8.9–10.3)
Chloride: 97 mmol/L — ABNORMAL LOW (ref 98–111)
Creatinine, Ser: 2.08 mg/dL — ABNORMAL HIGH (ref 0.61–1.24)
GFR, Estimated: 34 mL/min — ABNORMAL LOW (ref >60)
Glucose, Bld: 97 mg/dL (ref 70–99)
Potassium: 4.8 mmol/L (ref 3.5–5.1)
Sodium: 136 mmol/L (ref 135–145)

## 2020-06-06 LAB — BRAIN NATRIURETIC PEPTIDE: B Natriuretic Peptide: 1398.7 pg/mL — ABNORMAL HIGH (ref 0.0–100.0)

## 2020-06-06 MED ORDER — ISOSORBIDE MONONITRATE ER 30 MG PO TB24: 30.0000 mg | ORAL_TABLET | Freq: Two times a day (BID) | ORAL | 3 refills | Status: AC

## 2020-06-06 MED ORDER — FUROSEMIDE 20 MG PO TABS: 40.0000 mg | ORAL_TABLET | Freq: Two times a day (BID) | ORAL | 3 refills | Status: AC

## 2020-06-06 NOTE — Progress Notes (Signed)
ReDS Vest / Clip - 06/06/20 0900      ReDS Vest / Clip   Station Marker C    Ruler Value 32    ReDS Value Range Low volume    ReDS Actual Value 27    Anatomical Comments sitting

## 2020-06-06 NOTE — Telephone Encounter (Signed)
LM at Sandi Mealy PA's office about Pt's 11am appointment. Advised that pt was on his way but may be a few minutes late.

## 2020-06-06 NOTE — Patient Instructions (Signed)

## 2020-06-06 NOTE — Patient Instructions (Addendum)
START Imdur 30mg  (1 tablet) twice daily  INCREASE Lasix 40mg  (1 tablet) Twice daily  Labs done today, your results will be available in MyChart, we will contact you for abnormal readings.  Your physician recommends that you schedule a follow-up appointment in: 2-3 weeks  For Home Health:   Please draw BMET in 1 week and fax results to [phone]. Prescription faxed over to [facility]   Please follow Patient's bilateral leg swelling   Please Ace Wrap bilateral lower extremities    Follow wound to Right Foot  If you have any questions or concerns before your next appointment please send [place] a message through [facility] or call our office at [phone].    TO LEAVE A MESSAGE FOR THE NURSE SELECT OPTION 2, PLEASE LEAVE A MESSAGE INCLUDING: . YOUR NAME . DATE OF BIRTH . CALL BACK NUMBER . REASON FOR CALL**this is important as we prioritize the call backs  YOU WILL RECEIVE A CALL BACK THE SAME DAY AS LONG AS YOU CALL BEFORE 4:00 PM

## 2020-06-07 DIAGNOSIS — I5022 Chronic systolic (congestive) heart failure: Secondary | ICD-10-CM | POA: Diagnosis not present

## 2020-06-07 DIAGNOSIS — I959 Hypotension, unspecified: Secondary | ICD-10-CM | POA: Diagnosis not present

## 2020-06-07 DIAGNOSIS — Z6827 Body mass index (BMI) 27.0-27.9, adult: Secondary | ICD-10-CM | POA: Diagnosis not present

## 2020-06-07 NOTE — Progress Notes (Signed)
[facility] Progress Note   [name] [name] [id number] [birth year] 69 y.o.  [name]. is managed by Dr. Fanny Bien. Mohamed  Actively treated with chemotherapy/immunotherapy/hormonal therapy: no  Next scheduled appointment with provider: 07/15/2020  Assessment: Plan:    Chronic systolic heart failure ([place])  Ischemic cardiomyopathy  Chronic obstructive pulmonary disease, unspecified COPD type ([place])  Stage III squamous cell carcinoma of left lung ([place])  Other chest pain   Chronic left chest pain in the setting of congestive heart failure, ischemic cardiomyopathy, COPD, and a stage III squamous cell carcinoma of the left lung: [name] [name] presents to clinic today with ongoing chest pain.  He continues on oxycodone for pain control.  He is scheduled to have a CT scan of the chest this afternoon having missed a CT scan last month.  He is being followed conservatively and is scheduled to see Dr. Julien Nordmann back on 07/15/2020.  According to Dr. 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  Please see After Visit Summary for patient specific instructions.  Future Appointments  Date Time Provider [place]  06/25/2020  9:00 AM [facility] PA/NP [facility] None  06/26/2020  8:20 AM [facility] DEVICE REMOTES [facility]  07/12/2020  1:00 PM [facility] LAB [facility] None  07/15/2020  2:00 PM Curt Bears, MD [facility] None  07/25/2020 11:00 AM Marzetta Board, DPM [facility]  09/25/2020  8:20 AM [facility] DEVICE REMOTES [facility]  12/25/2020  8:20 AM [facility] DEVICE REMOTES [facility]  03/26/2021  8:20 AM [facility] DEVICE REMOTES [facility]    No orders of the defined types were placed in this encounter.      Subjective:   Patient ID:  [name] [name]. is a 69 y.o. (DOB July 30, 1950) male.  Chief Complaint:   Chief Complaint  Patient presents with  . Chest Pain    Chronic    HPI [name] [name].  is a 69 y.o. male with a diagnosis of a stage IIIc (T2 a, N3, M0) non-small cell lung cancer, squamous cell carcinoma diagnosed in October 2020 and presented with left upper lobe pleural-based mass in addition to left hilar and right paratracheal lymphadenopathy.  He is status post concurrent chemoradiation with weekly carboplatin and paclitaxel which was first dosed on May 08, 2019.  He completed 7 cycles of chemotherapy and declined consolidation immunotherapy.  He has a history of ongoing left chest pain which according to Dr. 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  He continues on oxycodone and Tylenol for pain.  He is pain is relieved for approximately 3 and half to 4 hours.  He reports that his pain is a hard sharp pain and is constant in his back.  He reports episodic hemoptysis.  He has had an 11 pound weight loss since 05/01/2020.  He is scheduled to have a CT scan completed this afternoon.  He was scheduled to have a CT scan completed last month but missed that appointment.  Medications: I have reviewed the patient's current medications.  Allergies:  Allergies  Allergen Reactions  . Penicillins Nausea And Vomiting, Swelling and Other (See Comments)    Per Dr Audria Nine Via phone 12/27/12 0400 SWELLING REACTION UNSPECIFIED   Did it involve swelling of the face/tongue/throat, SOB, or low BP? Unknown Did it involve sudden or severe rash/hives, skin peeling, or any  reaction on the inside of your mouth or nose? Unknown Did you need to seek medical attention at a hospital or doctor's office? Unknown When did it last happen?Childhood If all above answers are "NO", may proceed with cephalosporin use.    Past Medical History:  Diagnosis Date  . AICD (automatic  cardioverter/defibrillator) present    reports no shocks  - st Jude  . Ambulates with cane   . CAD (coronary artery disease)    a. 04/2013 Cath: LM 10, LAD 40p, 75m, D1 50p, LCX 60p, 29m/100m, OM1 50, OM2 100 L-L collats, RCA 100p;  b. CABG x 5: LIMA->LAD, VG->D2, VG->RI->OM2, VG->PDA.  . Cataracts, bilateral   . Chronic systolic CHF (congestive heart failure) ([place])    a. 04/2013 Echo: EF 15-20%, diff HK, sev HK of inf and ant myocardium, dilated LA,  b. EF 20-25% with restrictive filling pattern, mod RV dysfx, mild MR (10/10/2013);  c.  Echo 12/15: EF 25-30%  . COPD (chronic obstructive pulmonary disease) ([place])   . Diabetes mellitus without complication ([place])    unsure what type but was dx as an adult, takes no meds, check his cbg at home 2 times a week   . Emphysema   . GERD (gastroesophageal reflux disease)   . Gout   . Hyperlipidemia   . Hypertension   . Ischemic cardiomyopathy   . lung ca   . PVD (peripheral vascular disease) ([place])    a. (10/2013) ABIs: RIGHT 0.63, Waveforms: monophasic;  LEFT 0.78, Waveforms: monophasic  . Tobacco abuse    30+ pack-year history  . Transaminitis    a. 04/2013 Abd U/S and CT unremarkable, hepatitis panel neg -->felt to be 2/2 acute R heart failure.    Past Surgical History:  Procedure Laterality Date  . APPENDECTOMY  as a child  . BIOPSY  11/16/2019   Procedure: BIOPSY;  Surgeon: Rush Landmark Telford Nab., MD;  Location: [place];  Service: Gastroenterology;;  . CARDIAC CATHETERIZATION  04/08/2015   Procedure: Left Heart Cath and Cors/Grafts Angiography;  Surgeon: Peter M Martinique, MD;  Location: [place] CV LAB;  Service: Cardiovascular;;  . COLONOSCOPY    . COLONOSCOPY WITH PROPOFOL N/A 11/16/2019   Procedure: COLONOSCOPY WITH PROPOFOL;  Surgeon: Rush Landmark Telford Nab., MD;  Location: [place];  Service: Gastroenterology;  Laterality: N/A;  . CORONARY ARTERY BYPASS GRAFT N/A 05/05/2013   Procedure: CORONARY ARTERY BYPASS GRAFTING (CABG)  TIMES FIVE USING LEFT INTERNAL MAMMARY ARTERY AND RIGHT AND LEFT SAPHENOUS LEG VEIN HARVESTED ENDOSCOPICALLY;  Surgeon: Melrose Nakayama, MD;  Location: [place];  Service: Open Heart Surgery;  Laterality: N/A;  . ESOPHAGOGASTRODUODENOSCOPY (EGD) WITH PROPOFOL N/A 11/16/2019   Procedure: ESOPHAGOGASTRODUODENOSCOPY (EGD) WITH PROPOFOL;  Surgeon: Rush Landmark Telford Nab., MD;  Location: [place];  Service: Gastroenterology;  Laterality: N/A;  . HEMOSTASIS CLIP PLACEMENT  11/16/2019   Procedure: HEMOSTASIS CLIP PLACEMENT;  Surgeon: Irving Copas., MD;  Location: [place];  Service: Gastroenterology;;  . IMPLANTABLE CARDIOVERTER DEFIBRILLATOR IMPLANT N/A 07/06/2014   Procedure: St Jude IMPLANTABLE CARDIOVERTER DEFIBRILLATOR IMPLANT;  Surgeon: Deboraha Sprang, MD;  Location: [facility] CATH LAB;  Service: Cardiovascular;  Laterality: N/A;  . LEFT AND RIGHT HEART CATHETERIZATION WITH CORONARY ANGIOGRAM N/A 04/28/2013   Procedure: LEFT AND RIGHT HEART CATHETERIZATION WITH CORONARY ANGIOGRAM;  Surgeon: Burnell Blanks, MD;  Location: [facility] CATH LAB;  Service: Cardiovascular;  Laterality: N/A;  . MULTIPLE EXTRACTIONS WITH ALVEOLOPLASTY N/A 05/03/2013   Procedure: Extraction of tooth #s 3,4,5,6,8,9,27 with alveoloplast and maxillary  left osseous tuberosity reduction;  Surgeon: Lenn Cal, DDS;  Location: [place];  Service: Oral Surgery;  Laterality: N/A;  . POLYPECTOMY  11/16/2019   Procedure: POLYPECTOMY;  Surgeon: Rush Landmark Telford Nab., MD;  Location: [place];  Service: Gastroenterology;;  . TEE WITHOUT CARDIOVERSION N/A 09/01/2019   Procedure: TRANSESOPHAGEAL ECHOCARDIOGRAM (TEE);  Surgeon: Larey Dresser, MD;  Location: [facility] ENDOSCOPY;  Service: Cardiovascular;  Laterality: N/A;  . TOTAL HIP ARTHROPLASTY Right 11/30/2018   Procedure: TOTAL HIP ARTHROPLASTY ANTERIOR APPROACH;  Surgeon: Rod Can, MD;  Location: [facility];  Service: Orthopedics;  Laterality: Right;  Marland Kitchen VIDEO BRONCHOSCOPY  WITH ENDOBRONCHIAL ULTRASOUND N/A 04/20/2019   Procedure: VIDEO BRONCHOSCOPY WITH ENDOBRONCHIAL ULTRASOUND;  Surgeon: Margaretha Seeds, MD;  Location: [place];  Service: Thoracic;  Laterality: N/A;    Family History  Problem Relation Age of Onset  . Diabetes Mother   . Hypertension Father   . Hypertension Other   . Heart attack Other        Brothers  . Diabetes Brother   . Heart attack Brother   . Heart attack Brother   . Stroke Neg Hx     Social History   Socioeconomic History  . Marital status: Single    Spouse name: Not on file  . Number of children: Not on file  . Years of education: Not on file  . Highest education level: Not on file  Occupational History  . Occupation: Retired  Tobacco Use  . Smoking status: Former Smoker    Packs/day: 2.00    Years: 30.00    Pack years: 60.00    Types: Cigarettes    Start date: 1969    Quit date: 03/23/2019    Years since quitting: 1.2  . Smokeless tobacco: Never Used  . Tobacco comment: Pt states now smokes - half pack per day 04/05/19  Vaping Use  . Vaping Use: Never used  Substance and Sexual Activity  . Alcohol use: Not Currently  . Drug use: Yes    Types: Marijuana    Comment: last use 04/17/19  . Sexual activity: Not on file  Other Topics Concern  . Not on file  Social History Narrative   Lives in [place] with his girlfriend and her daughter.     Social Determinants of Health   Financial Resource Strain: Low Risk   . Difficulty of Paying Living Expenses: Not very hard  Food Insecurity: No Food Insecurity  . Worried About Charity fundraiser in the Last Year: Never true  . Ran Out of Food in the Last Year: Never true  Transportation Needs: No Transportation Needs  . Lack of Transportation (Medical): No  . Lack of Transportation (Non-Medical): No  Physical Activity: Not on file  Stress: Not on file  Social Connections: Not on file  Intimate Partner Violence: Not on file    Past Medical History, Surgical  history, Social history, and Family history were reviewed and updated as appropriate.   Please see review of systems for further details on the patient's review from today.   Review of Systems:  Review of Systems  Constitutional: Positive for appetite change, fatigue and unexpected weight change. Negative for chills, diaphoresis and fever.  HENT: Negative for trouble swallowing and voice change.   Respiratory: Positive for shortness of breath. Negative for cough, chest tightness and wheezing.   Cardiovascular: Positive for chest pain. Negative for palpitations.  Gastrointestinal: Negative for abdominal pain, constipation, diarrhea, nausea and vomiting.  Musculoskeletal: Negative  for back pain and myalgias.  Neurological: Negative for dizziness, light-headedness and headaches.    Objective:   Physical Exam:  BP 100/74 (BP Location: Right Arm, Patient Position: Sitting)   Pulse (!) 105   Temp (!) 97.2 F (36.2 C) (Tympanic)   Resp (!) 22   Ht 5\' 7"  (1.702 m)   Wt 176 lb 12.8 oz (80.2 kg)   SpO2 99%   BMI 27.69 kg/m  ECOG: 1  Physical Exam Constitutional:      General: He is not in acute distress.    Appearance: He is not toxic-appearing or diaphoretic.  HENT:     Head: Normocephalic and atraumatic.  Cardiovascular:     Rate and Rhythm: Regular rhythm. Tachycardia present.     Heart sounds: Normal heart sounds. No murmur heard. No friction rub. No gallop.   Pulmonary:     Effort: Pulmonary effort is normal. No respiratory distress.     Breath sounds: Examination of the right-upper field reveals rhonchi. Examination of the left-upper field reveals rhonchi. Examination of the right-middle field reveals rhonchi. Examination of the left-middle field reveals rhonchi. Examination of the right-lower field reveals rhonchi. Examination of the left-lower field reveals rhonchi. Rhonchi present. No wheezing or rales.  Skin:    General: Skin is warm and dry.     Findings: No erythema or  rash.  Neurological:     Mental Status: He is alert.     Lab Review:     Component Value Date/Time   NA 136 06/06/2020 1134   NA 140 08/23/2019 1020   K 4.8 06/06/2020 1134   CL 97 (L) 06/06/2020 1134   CO2 20 (L) 06/06/2020 1134   GLUCOSE 97 06/06/2020 1134   BUN 43 (H) 06/06/2020 1134   BUN 52 (H) 08/23/2019 1020   CREATININE 2.08 (H) 06/06/2020 1134   CREATININE 1.36 (H) 03/11/2020 1100   CREATININE 1.35 (H) 12/25/2015 1310   CALCIUM 10.1 06/06/2020 1134   PROT 7.3 03/11/2020 1100   PROT 5.7 (L) 07/26/2019 1044   ALBUMIN 3.0 (L) 03/11/2020 1100   ALBUMIN 3.2 (L) 07/26/2019 1044   AST 17 03/11/2020 1100   ALT 16 03/11/2020 1100   ALKPHOS 79 03/11/2020 1100   BILITOT 0.4 03/11/2020 1100   GFRNONAA 34 (L) 06/06/2020 1134   GFRNONAA 53 (L) 03/11/2020 1100   GFRAA 58 (L) 03/14/2020 1059   GFRAA >60 03/11/2020 1100       Component Value Date/Time   WBC 11.5 (H) 04/15/2020 0935   RBC 3.14 (L) 04/15/2020 0935   HGB 8.2 (L) 04/15/2020 0935   HGB 11.0 (L) 03/11/2020 1100   HCT 28.5 (L) 04/15/2020 0935   PLT 176 04/15/2020 0935   PLT 228 03/11/2020 1100   MCV 90.8 04/15/2020 0935   MCH 26.1 04/15/2020 0935   MCHC 28.8 (L) 04/15/2020 0935   RDW 21.3 (H) 04/15/2020 0935   LYMPHSABS 0.8 03/11/2020 1100   MONOABS 0.7 03/11/2020 1100   EOSABS 0.2 03/11/2020 1100   BASOSABS 0.0 03/11/2020 1100   -------------------------------  Imaging from last 24 hours (if applicable):  Radiology interpretation: CT Abdomen Pelvis Wo Contrast  Result Date: 06/06/2020 CLINICAL DATA:  Non-small cell lung cancer staging EXAM: CT CHEST, ABDOMEN AND PELVIS WITHOUT CONTRAST TECHNIQUE: Multidetector CT imaging of the chest, abdomen and pelvis was performed following the standard protocol without IV contrast. COMPARISON:  None. FINDINGS: CT CHEST FINDINGS Cardiovascular: No pericardial fluid. Coronary calcifications. Pacemaker noted. Mediastinum/Nodes: No axillary or supraclavicular  adenopathy. No mediastinal or hilar adenopathy. No pericardial fluid. Esophagus normal. Lungs/Pleura: Dense consolidation in the LEFT upper lobe with air bronchograms increased in density compared to most recent CT exam. (Image 60/4). Consultation is peripheral in the LEFT upper lobe and with some extension to the hilum and superior segment of the LEFT lower lobe. No new nodularity. RIGHT lung is clear. And coronal projection the lateral LEFT upper lobe consolidation appears angular extending from the hilum to the pleural surface again suggesting radiation change. Difficult to assess the hilum without IV contrast. Musculoskeletal: No aggressive osseous lesion. CT ABDOMEN AND PELVIS FINDINGS Hepatobiliary: No focal hepatic lesion. No biliary ductal dilatation. Gallbladder is normal. Common bile duct is normal. Pancreas: Pancreas is normal. No ductal dilatation. No pancreatic inflammation. Spleen: Normal spleen Adrenals/urinary tract: Adrenal glands normal. Benign cysts of the RIGHT kidney. Ureters and bladder normal. Stomach/Bowel: Stomach, small bowel, appendix, and cecum are normal. The colon and rectosigmoid colon are normal. Vascular/Lymphatic: Abdominal aorta is normal caliber with atherosclerotic calcification. There is no retroperitoneal or periportal lymphadenopathy. No pelvic lymphadenopathy. Reproductive: Prostate unremarkable Other: No peritoneal metastasis Musculoskeletal: RIGHT hip prosthetic. No aggressive osseous lesion. IMPRESSION: Chest Impression: 1. Increased angular consolidation in the LEFT upper lobe extend from the hilum to the pleural surface is most suggestive of progressive radiation change. No IV contrast limits detail evaluation of the hilum or any enhancing nodularity. However, again favor benign change. 2. No suspicious pulmonary nodules within LEFT or RIGHT lung. 3. No lymphadenopathy. 4. Post CABG. Abdomen / Pelvis Impression: 1. No evidence of metastasis in the upper pelvis on  noncontrast exam. 2.  Atherosclerotic calcification of the aorta. Electronically Signed   By: Suzy Bouchard M.D.   On: 06/06/2020 18:15   CT Chest Wo Contrast  Result Date: 06/06/2020 CLINICAL DATA:  Non-small cell lung cancer staging EXAM: CT CHEST, ABDOMEN AND PELVIS WITHOUT CONTRAST TECHNIQUE: Multidetector CT imaging of the chest, abdomen and pelvis was performed following the standard protocol without IV contrast. COMPARISON:  None. FINDINGS: CT CHEST FINDINGS Cardiovascular: No pericardial fluid. Coronary calcifications. Pacemaker noted. Mediastinum/Nodes: No axillary or supraclavicular adenopathy. No mediastinal or hilar adenopathy. No pericardial fluid. Esophagus normal. Lungs/Pleura: Dense consolidation in the LEFT upper lobe with air bronchograms increased in density compared to most recent CT exam. (Image 60/4). Consultation is peripheral in the LEFT upper lobe and with some extension to the hilum and superior segment of the LEFT lower lobe. No new nodularity. RIGHT lung is clear. And coronal projection the lateral LEFT upper lobe consolidation appears angular extending from the hilum to the pleural surface again suggesting radiation change. Difficult to assess the hilum without IV contrast. Musculoskeletal: No aggressive osseous lesion. CT ABDOMEN AND PELVIS FINDINGS Hepatobiliary: No focal hepatic lesion. No biliary ductal dilatation. Gallbladder is normal. Common bile duct is normal. Pancreas: Pancreas is normal. No ductal dilatation. No pancreatic inflammation. Spleen: Normal spleen Adrenals/urinary tract: Adrenal glands normal. Benign cysts of the RIGHT kidney. Ureters and bladder normal. Stomach/Bowel: Stomach, small bowel, appendix, and cecum are normal. The colon and rectosigmoid colon are normal. Vascular/Lymphatic: Abdominal aorta is normal caliber with atherosclerotic calcification. There is no retroperitoneal or periportal lymphadenopathy. No pelvic lymphadenopathy. Reproductive:  Prostate unremarkable Other: No peritoneal metastasis Musculoskeletal: RIGHT hip prosthetic. No aggressive osseous lesion. IMPRESSION: Chest Impression: 1. Increased angular consolidation in the LEFT upper lobe extend from the hilum to the pleural surface is most suggestive of progressive radiation change. No IV contrast limits detail evaluation of the hilum or  any enhancing nodularity. However, again favor benign change. 2. No suspicious pulmonary nodules within LEFT or RIGHT lung. 3. No lymphadenopathy. 4. Post CABG. Abdomen / Pelvis Impression: 1. No evidence of metastasis in the upper pelvis on noncontrast exam. 2.  Atherosclerotic calcification of the aorta. Electronically Signed   By: Suzy Bouchard M.D.   On: 06/06/2020 18:15        This case was discussed with Dr. Julien Nordmann. He expressed agreement with my management of this patient.

## 2020-06-10 DIAGNOSIS — M25511 Pain in right shoulder: Secondary | ICD-10-CM | POA: Diagnosis not present

## 2020-06-10 DIAGNOSIS — I480 Paroxysmal atrial fibrillation: Secondary | ICD-10-CM | POA: Diagnosis not present

## 2020-06-10 DIAGNOSIS — E785 Hyperlipidemia, unspecified: Secondary | ICD-10-CM | POA: Diagnosis not present

## 2020-06-10 DIAGNOSIS — I2510 Atherosclerotic heart disease of native coronary artery without angina pectoris: Secondary | ICD-10-CM | POA: Diagnosis not present

## 2020-06-10 DIAGNOSIS — J069 Acute upper respiratory infection, unspecified: Secondary | ICD-10-CM | POA: Diagnosis not present

## 2020-06-10 DIAGNOSIS — J449 Chronic obstructive pulmonary disease, unspecified: Secondary | ICD-10-CM | POA: Diagnosis not present

## 2020-06-10 DIAGNOSIS — K219 Gastro-esophageal reflux disease without esophagitis: Secondary | ICD-10-CM | POA: Diagnosis not present

## 2020-06-10 DIAGNOSIS — E1140 Type 2 diabetes mellitus with diabetic neuropathy, unspecified: Secondary | ICD-10-CM | POA: Diagnosis not present

## 2020-06-10 DIAGNOSIS — Z7901 Long term (current) use of anticoagulants: Secondary | ICD-10-CM | POA: Diagnosis not present

## 2020-06-13 ENCOUNTER — Emergency Department (HOSPITAL_COMMUNITY): Payer: Medicare HMO

## 2020-06-13 ENCOUNTER — Encounter (HOSPITAL_COMMUNITY): Payer: Self-pay

## 2020-06-13 ENCOUNTER — Other Ambulatory Visit: Payer: Self-pay

## 2020-06-13 ENCOUNTER — Inpatient Hospital Stay (HOSPITAL_COMMUNITY)
Admission: EM | Admit: 2020-06-13 | Discharge: 2020-06-22 | DRG: 871 | Disposition: E | Payer: Medicare HMO | Attending: Internal Medicine | Admitting: Internal Medicine

## 2020-06-13 ENCOUNTER — Ambulatory Visit (HOSPITAL_COMMUNITY): Admit: 2020-06-13 | Payer: Medicare HMO | Admitting: Cardiovascular Disease

## 2020-06-13 DIAGNOSIS — Z66 Do not resuscitate: Secondary | ICD-10-CM | POA: Diagnosis not present

## 2020-06-13 DIAGNOSIS — E785 Hyperlipidemia, unspecified: Secondary | ICD-10-CM | POA: Diagnosis present

## 2020-06-13 DIAGNOSIS — R680 Hypothermia, not associated with low environmental temperature: Secondary | ICD-10-CM | POA: Diagnosis present

## 2020-06-13 DIAGNOSIS — A419 Sepsis, unspecified organism: Secondary | ICD-10-CM | POA: Diagnosis not present

## 2020-06-13 DIAGNOSIS — R7989 Other specified abnormal findings of blood chemistry: Secondary | ICD-10-CM | POA: Diagnosis not present

## 2020-06-13 DIAGNOSIS — J690 Pneumonitis due to inhalation of food and vomit: Secondary | ICD-10-CM | POA: Diagnosis not present

## 2020-06-13 DIAGNOSIS — Z96641 Presence of right artificial hip joint: Secondary | ICD-10-CM | POA: Diagnosis present

## 2020-06-13 DIAGNOSIS — S90415A Abrasion, left lesser toe(s), initial encounter: Secondary | ICD-10-CM

## 2020-06-13 DIAGNOSIS — I130 Hypertensive heart and chronic kidney disease with heart failure and stage 1 through stage 4 chronic kidney disease, or unspecified chronic kidney disease: Secondary | ICD-10-CM | POA: Diagnosis present

## 2020-06-13 DIAGNOSIS — D696 Thrombocytopenia, unspecified: Secondary | ICD-10-CM

## 2020-06-13 DIAGNOSIS — E440 Moderate protein-calorie malnutrition: Secondary | ICD-10-CM | POA: Diagnosis present

## 2020-06-13 DIAGNOSIS — I255 Ischemic cardiomyopathy: Secondary | ICD-10-CM | POA: Diagnosis present

## 2020-06-13 DIAGNOSIS — R0789 Other chest pain: Secondary | ICD-10-CM | POA: Diagnosis not present

## 2020-06-13 DIAGNOSIS — Z85118 Personal history of other malignant neoplasm of bronchus and lung: Secondary | ICD-10-CM

## 2020-06-13 DIAGNOSIS — R945 Abnormal results of liver function studies: Secondary | ICD-10-CM | POA: Diagnosis not present

## 2020-06-13 DIAGNOSIS — J9601 Acute respiratory failure with hypoxia: Secondary | ICD-10-CM | POA: Diagnosis not present

## 2020-06-13 DIAGNOSIS — R748 Abnormal levels of other serum enzymes: Secondary | ICD-10-CM | POA: Diagnosis not present

## 2020-06-13 DIAGNOSIS — E1129 Type 2 diabetes mellitus with other diabetic kidney complication: Secondary | ICD-10-CM | POA: Diagnosis present

## 2020-06-13 DIAGNOSIS — Z833 Family history of diabetes mellitus: Secondary | ICD-10-CM

## 2020-06-13 DIAGNOSIS — Z87891 Personal history of nicotine dependence: Secondary | ICD-10-CM

## 2020-06-13 DIAGNOSIS — Z880 Allergy status to penicillin: Secondary | ICD-10-CM

## 2020-06-13 DIAGNOSIS — N1832 Chronic kidney disease, stage 3b: Secondary | ICD-10-CM | POA: Diagnosis present

## 2020-06-13 DIAGNOSIS — I5022 Chronic systolic (congestive) heart failure: Secondary | ICD-10-CM | POA: Diagnosis present

## 2020-06-13 DIAGNOSIS — R9431 Abnormal electrocardiogram [ECG] [EKG]: Secondary | ICD-10-CM | POA: Diagnosis present

## 2020-06-13 DIAGNOSIS — Z20822 Contact with and (suspected) exposure to covid-19: Secondary | ICD-10-CM | POA: Diagnosis present

## 2020-06-13 DIAGNOSIS — R6520 Severe sepsis without septic shock: Secondary | ICD-10-CM

## 2020-06-13 DIAGNOSIS — D631 Anemia in chronic kidney disease: Secondary | ICD-10-CM | POA: Diagnosis present

## 2020-06-13 DIAGNOSIS — E875 Hyperkalemia: Secondary | ICD-10-CM

## 2020-06-13 DIAGNOSIS — I213 ST elevation (STEMI) myocardial infarction of unspecified site: Secondary | ICD-10-CM | POA: Diagnosis not present

## 2020-06-13 DIAGNOSIS — R17 Unspecified jaundice: Secondary | ICD-10-CM | POA: Diagnosis present

## 2020-06-13 DIAGNOSIS — E1122 Type 2 diabetes mellitus with diabetic chronic kidney disease: Secondary | ICD-10-CM | POA: Diagnosis present

## 2020-06-13 DIAGNOSIS — R918 Other nonspecific abnormal finding of lung field: Secondary | ICD-10-CM | POA: Diagnosis not present

## 2020-06-13 DIAGNOSIS — N179 Acute kidney failure, unspecified: Secondary | ICD-10-CM

## 2020-06-13 DIAGNOSIS — Z8249 Family history of ischemic heart disease and other diseases of the circulatory system: Secondary | ICD-10-CM

## 2020-06-13 DIAGNOSIS — I2510 Atherosclerotic heart disease of native coronary artery without angina pectoris: Secondary | ICD-10-CM | POA: Diagnosis not present

## 2020-06-13 DIAGNOSIS — K219 Gastro-esophageal reflux disease without esophagitis: Secondary | ICD-10-CM

## 2020-06-13 DIAGNOSIS — Z7901 Long term (current) use of anticoagulants: Secondary | ICD-10-CM

## 2020-06-13 DIAGNOSIS — M109 Gout, unspecified: Secondary | ICD-10-CM | POA: Diagnosis present

## 2020-06-13 DIAGNOSIS — E8809 Other disorders of plasma-protein metabolism, not elsewhere classified: Secondary | ICD-10-CM | POA: Diagnosis not present

## 2020-06-13 DIAGNOSIS — R0603 Acute respiratory distress: Secondary | ICD-10-CM

## 2020-06-13 DIAGNOSIS — Z951 Presence of aortocoronary bypass graft: Secondary | ICD-10-CM

## 2020-06-13 DIAGNOSIS — T68XXXA Hypothermia, initial encounter: Secondary | ICD-10-CM | POA: Diagnosis not present

## 2020-06-13 DIAGNOSIS — J439 Emphysema, unspecified: Secondary | ICD-10-CM | POA: Diagnosis present

## 2020-06-13 DIAGNOSIS — G9341 Metabolic encephalopathy: Secondary | ICD-10-CM | POA: Diagnosis present

## 2020-06-13 DIAGNOSIS — R400 Somnolence: Secondary | ICD-10-CM

## 2020-06-13 DIAGNOSIS — Z515 Encounter for palliative care: Secondary | ICD-10-CM | POA: Diagnosis not present

## 2020-06-13 DIAGNOSIS — E1151 Type 2 diabetes mellitus with diabetic peripheral angiopathy without gangrene: Secondary | ICD-10-CM | POA: Diagnosis present

## 2020-06-13 DIAGNOSIS — D72829 Elevated white blood cell count, unspecified: Secondary | ICD-10-CM

## 2020-06-13 DIAGNOSIS — J449 Chronic obstructive pulmonary disease, unspecified: Secondary | ICD-10-CM | POA: Diagnosis not present

## 2020-06-13 DIAGNOSIS — I509 Heart failure, unspecified: Secondary | ICD-10-CM

## 2020-06-13 DIAGNOSIS — R0989 Other specified symptoms and signs involving the circulatory and respiratory systems: Secondary | ICD-10-CM

## 2020-06-13 DIAGNOSIS — E11649 Type 2 diabetes mellitus with hypoglycemia without coma: Secondary | ICD-10-CM | POA: Diagnosis not present

## 2020-06-13 DIAGNOSIS — E872 Acidosis, unspecified: Secondary | ICD-10-CM

## 2020-06-13 DIAGNOSIS — N189 Chronic kidney disease, unspecified: Secondary | ICD-10-CM

## 2020-06-13 DIAGNOSIS — R4182 Altered mental status, unspecified: Secondary | ICD-10-CM | POA: Diagnosis not present

## 2020-06-13 DIAGNOSIS — Z95810 Presence of automatic (implantable) cardiac defibrillator: Secondary | ICD-10-CM

## 2020-06-13 DIAGNOSIS — R791 Abnormal coagulation profile: Secondary | ICD-10-CM

## 2020-06-13 DIAGNOSIS — Z7984 Long term (current) use of oral hypoglycemic drugs: Secondary | ICD-10-CM

## 2020-06-13 DIAGNOSIS — R404 Transient alteration of awareness: Secondary | ICD-10-CM | POA: Diagnosis not present

## 2020-06-13 DIAGNOSIS — R079 Chest pain, unspecified: Secondary | ICD-10-CM | POA: Diagnosis not present

## 2020-06-13 DIAGNOSIS — N183 Chronic kidney disease, stage 3 unspecified: Secondary | ICD-10-CM | POA: Diagnosis present

## 2020-06-13 DIAGNOSIS — I482 Chronic atrial fibrillation, unspecified: Secondary | ICD-10-CM | POA: Diagnosis not present

## 2020-06-13 DIAGNOSIS — I4891 Unspecified atrial fibrillation: Secondary | ICD-10-CM | POA: Diagnosis present

## 2020-06-13 DIAGNOSIS — Z79899 Other long term (current) drug therapy: Secondary | ICD-10-CM

## 2020-06-13 DIAGNOSIS — E860 Dehydration: Secondary | ICD-10-CM | POA: Diagnosis not present

## 2020-06-13 DIAGNOSIS — Z6827 Body mass index (BMI) 27.0-27.9, adult: Secondary | ICD-10-CM

## 2020-06-13 LAB — PROTIME-INR
INR: 5.5 (ref 0.8–1.2)
Prothrombin Time: 48.2 seconds — ABNORMAL HIGH (ref 11.4–15.2)

## 2020-06-13 LAB — CBC WITH DIFFERENTIAL/PLATELET
Abs Immature Granulocytes: 0.16 10*3/uL — ABNORMAL HIGH (ref 0.00–0.07)
Basophils Absolute: 0 10*3/uL (ref 0.0–0.1)
Basophils Relative: 0 %
Eosinophils Absolute: 0 10*3/uL (ref 0.0–0.5)
Eosinophils Relative: 0 %
HCT: 39 % (ref 39.0–52.0)
Hemoglobin: 11.6 g/dL — ABNORMAL LOW (ref 13.0–17.0)
Immature Granulocytes: 1 %
Lymphocytes Relative: 5 %
Lymphs Abs: 0.8 10*3/uL (ref 0.7–4.0)
MCH: 26.9 pg (ref 26.0–34.0)
MCHC: 29.7 g/dL — ABNORMAL LOW (ref 30.0–36.0)
MCV: 90.3 fL (ref 80.0–100.0)
Monocytes Absolute: 0.9 10*3/uL (ref 0.1–1.0)
Monocytes Relative: 6 %
Neutro Abs: 13.9 10*3/uL — ABNORMAL HIGH (ref 1.7–7.7)
Neutrophils Relative %: 88 %
Platelets: 141 10*3/uL — ABNORMAL LOW (ref 150–400)
RBC: 4.32 MIL/uL (ref 4.22–5.81)
RDW: 20.4 % — ABNORMAL HIGH (ref 11.5–15.5)
WBC: 15.8 10*3/uL — ABNORMAL HIGH (ref 4.0–10.5)
nRBC: 1.5 % — ABNORMAL HIGH (ref 0.0–0.2)

## 2020-06-13 LAB — BRAIN NATRIURETIC PEPTIDE: B Natriuretic Peptide: 1219.2 pg/mL — ABNORMAL HIGH (ref 0.0–100.0)

## 2020-06-13 LAB — URINALYSIS, ROUTINE W REFLEX MICROSCOPIC
Bilirubin Urine: NEGATIVE
Glucose, UA: NEGATIVE mg/dL
Ketones, ur: NEGATIVE mg/dL
Leukocytes,Ua: NEGATIVE
Nitrite: NEGATIVE
Protein, ur: 30 mg/dL — AB
Specific Gravity, Urine: 1.015 (ref 1.005–1.030)
pH: 5 (ref 5.0–8.0)

## 2020-06-13 LAB — COMPREHENSIVE METABOLIC PANEL
ALT: 54 U/L — ABNORMAL HIGH (ref 0–44)
AST: 113 U/L — ABNORMAL HIGH (ref 15–41)
Albumin: 2.6 g/dL — ABNORMAL LOW (ref 3.5–5.0)
Alkaline Phosphatase: 87 U/L (ref 38–126)
Anion gap: 19 — ABNORMAL HIGH (ref 5–15)
BUN: 66 mg/dL — ABNORMAL HIGH (ref 8–23)
CO2: 13 mmol/L — ABNORMAL LOW (ref 22–32)
Calcium: 9.9 mg/dL (ref 8.9–10.3)
Chloride: 98 mmol/L (ref 98–111)
Creatinine, Ser: 2.58 mg/dL — ABNORMAL HIGH (ref 0.61–1.24)
GFR, Estimated: 26 mL/min — ABNORMAL LOW (ref >60)
Glucose, Bld: 73 mg/dL (ref 70–99)
Potassium: 6.1 mmol/L — ABNORMAL HIGH (ref 3.5–5.1)
Sodium: 130 mmol/L — ABNORMAL LOW (ref 135–145)
Total Bilirubin: 4.6 mg/dL — ABNORMAL HIGH (ref 0.3–1.2)
Total Protein: 6.7 g/dL (ref 6.5–8.1)

## 2020-06-13 LAB — LACTIC ACID, PLASMA
Lactic Acid, Venous: 4.3 mmol/L (ref 0.5–1.9)
Lactic Acid, Venous: 4.5 mmol/L (ref 0.5–1.9)

## 2020-06-13 LAB — RESP PANEL BY RT-PCR (FLU A&B, COVID) ARPGX2
Influenza A by PCR: NEGATIVE
Influenza B by PCR: NEGATIVE
SARS Coronavirus 2 by RT PCR: NEGATIVE

## 2020-06-13 LAB — TROPONIN I (HIGH SENSITIVITY)
Troponin I (High Sensitivity): 34 ng/L — ABNORMAL HIGH (ref <18)
Troponin I (High Sensitivity): 35 ng/L — ABNORMAL HIGH (ref <18)

## 2020-06-13 LAB — GLUCOSE, CAPILLARY: Glucose-Capillary: 98 mg/dL (ref 70–99)

## 2020-06-13 LAB — HEMOGLOBIN A1C
Hgb A1c MFr Bld: 5.2 % (ref 4.8–5.6)
Mean Plasma Glucose: 102.54 mg/dL

## 2020-06-13 LAB — ETHANOL: Alcohol, Ethyl (B): <10 mg/dL (ref <10)

## 2020-06-13 LAB — PROCALCITONIN: Procalcitonin: 1.22 ng/mL

## 2020-06-13 LAB — CBG MONITORING, ED: Glucose-Capillary: 70 mg/dL (ref 70–99)

## 2020-06-13 SURGERY — LEFT HEART CATH AND CORONARY ANGIOGRAPHY
Anesthesia: LOCAL

## 2020-06-13 MED ORDER — INSULIN ASPART 100 UNIT/ML ~~LOC~~ SOLN
0.0000 [IU] | Freq: Three times a day (TID) | SUBCUTANEOUS | Status: DC
  Administered 2020-06-14: 17:00:00 1 [IU] via SUBCUTANEOUS

## 2020-06-13 MED ORDER — TIOTROPIUM BROMIDE MONOHYDRATE 18 MCG IN CAPS: 1.0000 | ORAL_CAPSULE | Freq: Every day | RESPIRATORY_TRACT | Status: DC

## 2020-06-13 MED ORDER — GLUCERNA SHAKE PO LIQD
237.0000 mL | Freq: Three times a day (TID) | ORAL | Status: DC
  Administered 2020-06-13 – 2020-06-14 (×2): 237 mL via ORAL
  Filled 2020-06-13: qty 237

## 2020-06-13 MED ORDER — SODIUM CHLORIDE 0.9 % IV SOLN
1.0000 g | Freq: Three times a day (TID) | INTRAVENOUS | Status: DC
  Administered 2020-06-13 – 2020-06-14 (×2): 1 g via INTRAVENOUS
  Filled 2020-06-13 (×5): qty 1

## 2020-06-13 MED ORDER — UMECLIDINIUM BROMIDE 62.5 MCG/INH IN AEPB
1.0000 | INHALATION_SPRAY | Freq: Every day | RESPIRATORY_TRACT | Status: DC
  Administered 2020-06-14: 09:00:00 1 via RESPIRATORY_TRACT
  Filled 2020-06-13: qty 7

## 2020-06-13 MED ORDER — ACETAMINOPHEN 325 MG PO TABS
650.0000 mg | ORAL_TABLET | Freq: Four times a day (QID) | ORAL | Status: DC | PRN
  Administered 2020-06-13: 650 mg via ORAL
  Filled 2020-06-13: qty 2

## 2020-06-13 MED ORDER — SODIUM CHLORIDE 0.9 % IV SOLN
2.0000 g | Freq: Once | INTRAVENOUS | Status: AC
  Administered 2020-06-13: 17:00:00 2 g via INTRAVENOUS
  Filled 2020-06-13: qty 2

## 2020-06-13 MED ORDER — LACTATED RINGERS IV BOLUS (SEPSIS)
500.0000 mL | Freq: Once | INTRAVENOUS | Status: AC
  Administered 2020-06-13: 15:00:00 500 mL via INTRAVENOUS

## 2020-06-13 MED ORDER — SODIUM ZIRCONIUM CYCLOSILICATE 10 G PO PACK
10.0000 g | PACK | Freq: Once | ORAL | Status: AC
  Administered 2020-06-13: 16:00:00 10 g via ORAL
  Filled 2020-06-13: qty 1

## 2020-06-13 MED ORDER — CALCIUM GLUCONATE-NACL 1-0.675 GM/50ML-% IV SOLN
1.0000 g | Freq: Once | INTRAVENOUS | Status: AC
  Administered 2020-06-13: 16:00:00 1000 mg via INTRAVENOUS
  Filled 2020-06-13: qty 50

## 2020-06-13 MED ORDER — PANTOPRAZOLE SODIUM 40 MG PO TBEC
40.0000 mg | DELAYED_RELEASE_TABLET | Freq: Every day | ORAL | Status: DC
  Administered 2020-06-13 – 2020-06-14 (×2): 40 mg via ORAL
  Filled 2020-06-13 (×2): qty 1

## 2020-06-13 MED ORDER — INSULIN ASPART 100 UNIT/ML ~~LOC~~ SOLN: 0.0000 [IU] | Freq: Every day | SUBCUTANEOUS | Status: DC

## 2020-06-13 MED ORDER — ATORVASTATIN CALCIUM 10 MG PO TABS
20.0000 mg | ORAL_TABLET | Freq: Every day | ORAL | Status: DC
  Administered 2020-06-13 – 2020-06-14 (×2): 20 mg via ORAL
  Filled 2020-06-13 (×2): qty 2

## 2020-06-13 MED ORDER — SODIUM CHLORIDE 0.9 % IV SOLN: Freq: Once | INTRAVENOUS | Status: AC

## 2020-06-13 MED ORDER — SUCRALFATE 1 G PO TABS
1.0000 g | ORAL_TABLET | Freq: Three times a day (TID) | ORAL | Status: DC
  Administered 2020-06-13 – 2020-06-14 (×5): 1 g via ORAL
  Filled 2020-06-13 (×5): qty 1

## 2020-06-13 NOTE — ED Notes (Addendum)
This RN notified MD for pain medication for pt, Per MD wants to see how effective tylenol will be first

## 2020-06-13 NOTE — ED Notes (Signed)
Warming blanket applied. Pt transferred to [place].

## 2020-06-13 NOTE — ED Notes (Signed)
Bair hugger removed per md direction. Warm blankets applied

## 2020-06-13 NOTE — ED Notes (Signed)
Critical LA reported to DO [place]

## 2020-06-13 NOTE — ED Notes (Signed)
Report to [place], [place]

## 2020-06-13 NOTE — ED Provider Notes (Signed)
[facility] EMERGENCY DEPARTMENT Provider Note  CSN: [id number] Arrival date & time: 06/20/2020 1054    History Chief Complaint  Patient presents with  . Chest Pain    HPI  [name] [name]. is a 70 y.o. male with history of CAD, AICD, CHF, COPD, lung cancer (in remission) s/p CABG who was brought to the ED via [place] for STEMI. He has had chest pain off and on for several weeks. He was seen in the [facility] ED on 11/30 for same, transferred to [facility] for STEMI concerns then and had a heart cath showing his diffuse CAD but no new lesions in need of intervention. He was seen again there on 12/3 and his workup was again negative. He was discharged with plan for aggressive medical management. EMS reports she gave him ASA and NTG and that his BP and SpO2 both dropped afterwards, they were unable to get an IV. He arrived in the ED cold to touch, with dried stool on his hands and bottom. He reports he has had cough, hemoptysis, chest pain, fever and SOB recently as well. Does not give a clear history, some confusion, Level 5 caveat applies.    Past Medical History:  Diagnosis Date  . AICD (automatic cardioverter/defibrillator) present    reports no shocks  - st Jude  . Ambulates with cane   . CAD (coronary artery disease)    a. 04/2013 Cath: LM 10, LAD 40p, 68m, D1 50p, LCX 60p, 52m/100m, OM1 50, OM2 100 L-L collats, RCA 100p;  b. CABG x 5: LIMA->LAD, VG->D2, VG->RI->OM2, VG->PDA.  . Cataracts, bilateral   . Chronic systolic CHF (congestive heart failure) ([place])    a. 04/2013 Echo: EF 15-20%, diff HK, sev HK of inf and ant myocardium, dilated LA,  b. EF 20-25% with restrictive filling pattern, mod RV dysfx, mild MR (10/10/2013);  c.  Echo 12/15: EF 25-30%  . COPD (chronic obstructive pulmonary disease) ([place])   . Diabetes mellitus without complication ([place])    unsure what type but was dx as an adult, takes no meds, check his cbg at home 2 times a week   . Emphysema   . GERD (gastroesophageal  reflux disease)   . Gout   . Hyperlipidemia   . Hypertension   . Ischemic cardiomyopathy   . lung ca   . PVD (peripheral vascular disease) ([place])    a. (10/2013) ABIs: RIGHT 0.63, Waveforms: monophasic;  LEFT 0.78, Waveforms: monophasic  . Tobacco abuse    30+ pack-year history  . Transaminitis    a. 04/2013 Abd U/S and CT unremarkable, hepatitis panel neg -->felt to be 2/2 acute R heart failure.    Past Surgical History:  Procedure Laterality Date  . APPENDECTOMY  as a child  . BIOPSY  11/16/2019   Procedure: BIOPSY;  Surgeon: Rush Landmark Telford Nab., MD;  Location: [facility];  Service: Gastroenterology;;  . CARDIAC CATHETERIZATION  04/08/2015   Procedure: Left Heart Cath and Cors/Grafts Angiography;  Surgeon: Peter M Martinique, MD;  Location: [facility] CV LAB;  Service: Cardiovascular;;  . COLONOSCOPY    . COLONOSCOPY WITH PROPOFOL N/A 11/16/2019   Procedure: COLONOSCOPY WITH PROPOFOL;  Surgeon: Rush Landmark Telford Nab., MD;  Location: [facility];  Service: Gastroenterology;  Laterality: N/A;  . CORONARY ARTERY BYPASS GRAFT N/A 05/05/2013   Procedure: CORONARY ARTERY BYPASS GRAFTING (CABG) TIMES FIVE USING LEFT INTERNAL MAMMARY ARTERY AND RIGHT AND LEFT SAPHENOUS LEG VEIN HARVESTED ENDOSCOPICALLY;  Surgeon: Melrose Nakayama, MD;  Location: [facility];  Service: Open Heart Surgery;  Laterality: N/A;  . ESOPHAGOGASTRODUODENOSCOPY (EGD) WITH PROPOFOL N/A 11/16/2019   Procedure: ESOPHAGOGASTRODUODENOSCOPY (EGD) WITH PROPOFOL;  Surgeon: Rush Landmark Telford Nab., MD;  Location: [facility];  Service: Gastroenterology;  Laterality: N/A;  . HEMOSTASIS CLIP PLACEMENT  11/16/2019   Procedure: HEMOSTASIS CLIP PLACEMENT;  Surgeon: Irving Copas., MD;  Location: [place];  Service: Gastroenterology;;  . IMPLANTABLE CARDIOVERTER DEFIBRILLATOR IMPLANT N/A 07/06/2014   Procedure: St Jude IMPLANTABLE CARDIOVERTER DEFIBRILLATOR IMPLANT;  Surgeon: Deboraha Sprang, MD;  Location: [facility] C[facility] LAB;   Service: Cardiovascular;  Laterality: N/A;  . LEFT AND RIGHT HEART CATHETERIZATION WITH CORONARY ANGIOGRAM N/A 04/28/2013   Procedure: LEFT AND RIGHT HEART CATHETERIZATION WITH CORONARY ANGIOGRAM;  Surgeon: Burnell Blanks, MD;  Location: [facility] CATH LAB;  Service: Cardiovascular;  Laterality: N/A;  . MULTIPLE EXTRACTIONS WITH ALVEOLOPLASTY N/A 05/03/2013   Procedure: Extraction of tooth #s 3,4,5,6,8,9,27 with alveoloplast and maxillary left osseous tuberosity reduction;  Surgeon: Lenn Cal, DDS;  Location: [place];  Service: Oral Surgery;  Laterality: N/A;  . POLYPECTOMY  11/16/2019   Procedure: POLYPECTOMY;  Surgeon: Rush Landmark Telford Nab., MD;  Location: [place];  Service: Gastroenterology;;  . TEE WITHOUT CARDIOVERSION N/A 09/01/2019   Procedure: TRANSESOPHAGEAL ECHOCARDIOGRAM (TEE);  Surgeon: Larey Dresser, MD;  Location: [facility] ENDOSCOPY;  Service: Cardiovascular;  Laterality: N/A;  . TOTAL HIP ARTHROPLASTY Right 11/30/2018   Procedure: TOTAL HIP ARTHROPLASTY ANTERIOR APPROACH;  Surgeon: Rod Can, MD;  Location: [facility] ORS;  Service: Orthopedics;  Laterality: Right;  Marland Kitchen VIDEO BRONCHOSCOPY WITH ENDOBRONCHIAL ULTRASOUND N/A 04/20/2019   Procedure: VIDEO BRONCHOSCOPY WITH ENDOBRONCHIAL ULTRASOUND;  Surgeon: Margaretha Seeds, MD;  Location: [place];  Service: Thoracic;  Laterality: N/A;    Family History  Problem Relation Age of Onset  . Diabetes Mother   . Hypertension Father   . Hypertension Other   . Heart attack Other        Brothers  . Diabetes Brother   . Heart attack Brother   . Heart attack Brother   . Stroke Neg Hx     Social History   Tobacco Use  . Smoking status: Former Smoker    Packs/day: 2.00    Years: 30.00    Pack years: 60.00    Types: Cigarettes    Start date: 1969    Quit date: 03/23/2019    Years since quitting: 1.2  . Smokeless tobacco: Never Used  . Tobacco comment: Pt states now smokes - half pack per day 04/05/19  Vaping Use  . Vaping  Use: Never used  Substance Use Topics  . Alcohol use: Not Currently  . Drug use: Yes    Types: Marijuana    Comment: last use 04/17/19     Home Medications Prior to Admission medications   Medication Sig Start Date End Date Taking? Authorizing Provider  albuterol (ACCUNEB) 1.25 MG/3ML nebulizer solution Take 1 ampule by nebulization every 6 (six) hours as needed for wheezing.    [provider]  albuterol (VENTOLIN HFA) 108 (90 Base) MCG/ACT inhaler Inhale 1-2 puffs into the lungs every 6 (six) hours as needed for wheezing or shortness of breath. Pt needs appt for further refills. 05/23/20   Margaretha Seeds, MD  allopurinol (ZYLOPRIM) 300 MG tablet Take 300 mg by mouth daily.    [provider]  atorvastatin (LIPITOR) 20 MG tablet Take 20 mg by mouth daily.    [provider]  b complex vitamins tablet  Take 1 tablet by mouth at bedtime.     [provider]  chlorproMAZINE (THORAZINE) 25 MG tablet Take 1 tablet (25 mg total) by mouth 3 (three) times daily as needed. 08/22/19   Curt Bears, MD  Cholecalciferol (VITAMIN D3) 25 MCG (1000 UT) CAPS Take 1,000 Units by mouth daily at 12 noon.     [provider]  dapagliflozin propanediol (FARXIGA) 10 MG TABS tablet Take 1 tablet (10 mg total) by mouth daily before breakfast. 04/15/20   Lyda Jester M, PA-C  DUREZOL 0.05 % EMUL  03/27/20   [provider]  ELIQUIS 5 MG TABS tablet Take 5 mg by mouth 2 (two) times daily.  07/20/19   [provider]  ferrous gluconate (FERGON) 324 MG tablet Take 1 tablet (324 mg total) by mouth daily with breakfast. 11/18/19   Caren Griffins, MD  furosemide (LASIX) 20 MG tablet Take 2 tablets (40 mg total) by mouth 2 (two) times daily. 06/06/20   Clegg, Amy D, NP  gabapentin (NEURONTIN) 300 MG capsule 300 mg 3 (three) times daily.  04/09/20   [provider]  guaiFENesin-dextromethorphan (ROBITUSSIN DM) 100-10 MG/5ML syrup Take 5 mLs by  mouth every 4 (four) hours as needed for cough.     [provider]  isosorbide mononitrate (IMDUR) 30 MG 24 hr tablet Take 1 tablet (30 mg total) by mouth in the morning and at bedtime. 06/06/20   Clegg, Amy D, NP  Melatonin 3 MG TABS Take 3 mg by mouth at bedtime.    [provider]  metoprolol succinate (TOPROL-XL) 25 MG 24 hr tablet Take 1 tablet (25 mg total) by mouth daily. 03/28/20   Larey Dresser, MD  nitroGLYCERIN (NITROSTAT) 0.4 MG SL tablet Place 0.4 mg under the tongue every 5 (five) minutes as needed for chest pain.  09/15/19   [provider]  ofloxacin (OCUFLOX) 0.3 % ophthalmic solution  03/26/20   [provider]  omeprazole (PRILOSEC) 40 MG capsule Take 40 mg by mouth daily.    [provider]  Oxycodone HCl 10 MG TABS  04/23/20   [provider]  potassium chloride SA (KLOR-CON) 20 MEQ tablet Take 1 tablet (20 mEq total) by mouth daily. 08/03/19   Fredia Sorrow, MD  senna (SENOKOT) 8.6 MG TABS tablet Take 1 tablet (8.6 mg total) by mouth 2 (two) times daily. 12/01/18   Swinteck, Aaron Edelman, MD  SPIRIVA HANDIHALER 18 MCG inhalation capsule Place 1 capsule into inhaler and inhale daily. 10/16/19   [provider]  spironolactone (ALDACTONE) 25 MG tablet Take 0.5 tablets (12.5 mg total) by mouth daily. 05/08/20 08/06/20  Larey Dresser, MD  sucralfate (CARAFATE) 1 g tablet Take 1 tablet (1 g total) by mouth 4 (four) times daily -  with meals and at bedtime. 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     [provider]     Allergies    Penicillins   Review of Systems   Review of Systems Unable to assess due to mental status.     Physical Exam BP 106/74   Pulse (!) 101   Temp (!) 96.6 F (35.9 C) (Rectal)   Resp 15   SpO2 100%   Physical Exam Vitals  and nursing note reviewed.  Constitutional:  Appearance: Normal appearance.  HENT:     Head: Normocephalic and atraumatic.     Nose: Nose normal.     Mouth/Throat:     Mouth: Mucous membranes are moist.  Eyes:     Extraocular Movements: Extraocular movements intact.     Conjunctiva/sclera: Conjunctivae normal.  Cardiovascular:     Rate and Rhythm: Tachycardia present.     Comments: AICD in place, prior sternotomy Pulmonary:     Effort: Pulmonary effort is normal.     Breath sounds: Normal breath sounds.  Abdominal:     General: Abdomen is flat.     Palpations: Abdomen is soft.     Tenderness: There is no abdominal tenderness.  Musculoskeletal:        General: No swelling. Normal range of motion.     Cervical back: Neck supple.  Skin:    General: Skin is dry.     Comments: Cool to touch  Neurological:     General: No focal deficit present.     Mental Status: He is oriented to person, place, and time.  Psychiatric:        Mood and Affect: Mood normal.      ED Results / Procedures / Treatments   Labs (all labs ordered are listed, but only abnormal results are displayed) Labs Reviewed  COMPREHENSIVE METABOLIC PANEL - Abnormal; Notable for the following components:      Result Value   Sodium 130 (*)    Potassium 6.1 (*)    CO2 13 (*)    BUN 66 (*)    Creatinine, Ser 2.58 (*)    Albumin 2.6 (*)    AST 113 (*)    ALT 54 (*)    Total Bilirubin 4.6 (*)    GFR, Estimated 26 (*)    Anion gap 19 (*)    All other components within normal limits  CBC WITH DIFFERENTIAL/PLATELET - Abnormal; Notable for the following components:   WBC 15.8 (*)    Hemoglobin 11.6 (*)    MCHC 29.7 (*)    RDW 20.4 (*)    Platelets 141 (*)    nRBC 1.5 (*)    Neutro Abs 13.9 (*)    Abs Immature Granulocytes 0.16 (*)    All other components within normal limits  BRAIN NATRIURETIC PEPTIDE - Abnormal; Notable for the following components:   B Natriuretic Peptide 1,219.2 (*)    All other  components within normal limits  PROTIME-INR - Abnormal; Notable for the following components:   Prothrombin Time 48.2 (*)    INR 5.5 (*)    All other components within normal limits  TROPONIN I (HIGH SENSITIVITY) - Abnormal; Notable for the following components:   Troponin I (High Sensitivity) 34 (*)    All other components within normal limits  RESP PANEL BY RT-PCR (FLU A&B, COVID) ARPGX2  URINE CULTURE  CULTURE, BLOOD (ROUTINE X 2)  CULTURE, BLOOD (ROUTINE X 2)  LACTIC ACID, PLASMA  LACTIC ACID, PLASMA  URINALYSIS, ROUTINE W REFLEX MICROSCOPIC  TROPONIN I (HIGH SENSITIVITY)    EKG EKG Interpretation  Date/Time:  Thursday June 13 2020 10:59:13 EST Ventricular Rate:  102 PR Interval:    QRS Duration: 168 QT Interval:  458 QTC Calculation: 597 R Axis:   -71 Text Interpretation: Ectopic atrial tachycardia, unifocal LVH with repol abnormalities Left axis deviation Right bundle branch block No significant change since last tracing Confirmed by Calvert Cantor [phone]) on 06/12/2020 11:16:33 AM   Radiology DG Chest [facility]  1 View  Result Date: 06/20/2020 CLINICAL DATA:  Left sided chest pain EXAM: PORTABLE CHEST 1 VIEW COMPARISON:  01/05/2020 FINDINGS: Band like area of consolidation in the left upper lung extending from the periphery to the suprahilar region. Surgical clips in the left hilum. Right lung is clear. No pleural effusion or pneumothorax. Stable cardiomediastinal silhouette. Prior CABG. IMPRESSION: Band like area of consolidation in the left upper lung extending from the periphery to the suprahilar region. Overall appearance is similar to the prior examination of 01/05/2020. This likely reflects post radiation changes. No acute cardiopulmonary disease. Electronically Signed   By: Kathreen Devoid   On: 06/07/2020 11:52    Procedures .Critical Care Performed by: Truddie Hidden, MD Authorized by: Truddie Hidden, MD   Critical care provider statement:    Critical  care time (minutes):  45   Critical care was necessary to treat or prevent imminent or life-threatening deterioration of the following conditions:  Sepsis, renal failure and metabolic crisis   Critical care was time spent personally by me on the following activities:  Discussions with consultants, evaluation of patient's response to treatment, examination of patient, ordering and performing treatments and interventions, ordering and review of laboratory studies, ordering and review of radiographic studies, pulse oximetry, re-evaluation of patient's condition, obtaining history from patient or surrogate and review of old charts    Medications Ordered in the ED Medications  lactated ringers bolus 500 mL (has no administration in time range)  calcium gluconate 1 g/ 50 mL sodium chloride IVPB (has no administration in time range)  sodium zirconium cyclosilicate (LOKELMA) packet 10 g (has no administration in time range)     MDM Rules/Calculators/A&P MDM Patient's EKG does not show STEMI, similar to previous EKG. Will cancel Code STEMI/Cath Lab, proceed with ED workup, warming measures, cleaning stool. Check CXR, covid, labs.  ED Course  I have reviewed the triage vital signs and the nursing notes.  Pertinent labs & imaging results that were available during my care of the patient were reviewed by me and considered in my medical decision making (see chart for details).  Clinical Course as of 05/26/2020 1426  Thu Jun 13, 2020  1202 CXR images and results reviewed, chronic radiation changes. He has mild leukocytosis on CBC, anemia is at baseline.  [CS]  1239 CMP shows worsening CKD, previously felt to be due to increased lasix during his admission last month but continues to worsen, now with mildly elevated K as well. Initial Trop is also mildly elevated.  [CS]  8657 INR is elevated.  [CS]  8469 Patient is more alert now, states he has been too weak to get out of bed the last few days, hence the  dried stool. He is living with his sister who cannot lift him. His initial presentation most concerning with cardiac etiology given his history but with hypothermia and leukocytosis, will expand workup to include sepsis evaluation with lactic acid and blood cultures. His BP is normal, so will avoid large volume fluid bolus given his tenuous cardiac status. He remains Full Code. Plan admission.  [CS]  1408 Spoke with Dr. Josephine Cables, Hospitalist, who requests CaGluc and [facility] for hyperkalemia which may be in part due to his metabolic acidosis however correction with IVF may take time given his low EF and need to give gentle hydration.  [CS]    Clinical Course User Index [CS] Truddie Hidden, MD    Final Clinical Impression(s) / ED Diagnoses Final diagnoses:  Other  chest pain  AKI (acute kidney injury) ([place])  Hypothermia, initial encounter  Hyperkalemia  Metabolic acidosis    Rx / DC Orders ED Discharge Orders    None       Truddie Hidden, MD 06/04/2020 1426

## 2020-06-13 NOTE — H&P (Signed)
History and Physical  [name] [name]. FGH:[id number] DOB: [birth year] DOA: 06/05/2020  Referring physician: Truddie Hidden, MD PCP: [name], PA-C  Patient coming from: Home  Chief Complaint: Chest pain  HPI: [name] [name]. is a 69 y.o. male with medical history significant for CAD s/p CABG, AICD, CHF, COPD, lung cancer (in remission) who was brought to the ED via [place] for STEMI.  Patient was unable to provide history regarding why he came to the ED, he only referred to reproducible chest pain.  Most of the history was obtained from the ED physician and ED medical record.  Per report, patient has been having several weeks of intermittent chest pain and he presented to [facility] ED on 11/30 due to same complaint of chest pain, he was transferred to [facility] for STEMI at that time, cardiac catheterization done showed diffuse CAD with no need for any intervention. EMS was activated due to patient being weak and not getting out of bed for a few days.  On arrival of EMS, aspirin and nitroglycerin were given with subsequent drop in blood pressure and O2 sats and with difficulty to obtain PIV.  Patient endorsed cough with occasional vomiting of blood with last episode being yesterday.  He also complained of shortness of breath, fever and chest pain recently per medical record.  ED Course: In the emergency department, patient was reported to arrive being cold to touch, and with dried stools on his hands and bottom.  Temperature was 96.71F, RR 22/min, HR 100bpm, BP was 180/168.  Work-up in the ED showed leukocytosis, normocytic anemia, thrombocytopenia, hyponatremia, hyperkalemia, BUN to creatinine 66/2.58 (baseline creatinine at 1.4-1.8).  BNP 1219.2 (this was 1398.7 about 7 days ago).  INR 5.5, T bili 4.6, albumin 2.6.  Urinalysis was unimpressive for UTI. Chest x-ray showed no acute cardiopulmonary disease Bair hugger was provided with improvement in temperature Calcium gluconate was given and  Lokelma was given due to hyperkalemia.  IV hydration 500 mL of LR was given. Hospitalist was asked to admit patient for further evaluation and management.  Review of Systems: This cannot be obtained at this time due to patient's current mental status  Past Medical History:  Diagnosis Date  . AICD (automatic cardioverter/defibrillator) present    reports no shocks  - st Jude  . Ambulates with cane   . CAD (coronary artery disease)    a. 04/2013 Cath: LM 10, LAD 40p, 39m, D1 50p, LCX 60p, 40m/100m, OM1 50, OM2 100 L-L collats, RCA 100p;  b. CABG x 5: LIMA->LAD, VG->D2, VG->RI->OM2, VG->PDA.  . Cataracts, bilateral   . Chronic systolic CHF (congestive heart failure) ([place])    a. 04/2013 Echo: EF 15-20%, diff HK, sev HK of inf and ant myocardium, dilated LA,  b. EF 20-25% with restrictive filling pattern, mod RV dysfx, mild MR (10/10/2013);  c.  Echo 12/15: EF 25-30%  . COPD (chronic obstructive pulmonary disease) ([place])   . Diabetes mellitus without complication ([place])    unsure what type but was dx as an adult, takes no meds, check his cbg at home 2 times a week   . Emphysema   . GERD (gastroesophageal reflux disease)   . Gout   . Hyperlipidemia   . Hypertension   . Ischemic cardiomyopathy   . lung ca   . PVD (peripheral vascular disease) ([place])    a. (10/2013) ABIs: RIGHT 0.63, Waveforms: monophasic;  LEFT 0.78, Waveforms: monophasic  . Tobacco abuse    30+ pack-year  history  . Transaminitis    a. 04/2013 Abd U/S and CT unremarkable, hepatitis panel neg -->felt to be 2/2 acute R heart failure.   Past Surgical History:  Procedure Laterality Date  . APPENDECTOMY  as a child  . BIOPSY  11/16/2019   Procedure: BIOPSY;  Surgeon: Rush Landmark Telford Nab., MD;  Location: [place];  Service: Gastroenterology;;  . CARDIAC CATHETERIZATION  04/08/2015   Procedure: Left Heart Cath and Cors/Grafts Angiography;  Surgeon: Peter M Martinique, MD;  Location: [place] CV LAB;  Service:  Cardiovascular;;  . COLONOSCOPY    . COLONOSCOPY WITH PROPOFOL N/A 11/16/2019   Procedure: COLONOSCOPY WITH PROPOFOL;  Surgeon: Rush Landmark Telford Nab., MD;  Location: [place];  Service: Gastroenterology;  Laterality: N/A;  . CORONARY ARTERY BYPASS GRAFT N/A 05/05/2013   Procedure: CORONARY ARTERY BYPASS GRAFTING (CABG) TIMES FIVE USING LEFT INTERNAL MAMMARY ARTERY AND RIGHT AND LEFT SAPHENOUS LEG VEIN HARVESTED ENDOSCOPICALLY;  Surgeon: Melrose Nakayama, MD;  Location: [place];  Service: Open Heart Surgery;  Laterality: N/A;  . ESOPHAGOGASTRODUODENOSCOPY (EGD) WITH PROPOFOL N/A 11/16/2019   Procedure: ESOPHAGOGASTRODUODENOSCOPY (EGD) WITH PROPOFOL;  Surgeon: Rush Landmark Telford Nab., MD;  Location: [place];  Service: Gastroenterology;  Laterality: N/A;  . HEMOSTASIS CLIP PLACEMENT  11/16/2019   Procedure: HEMOSTASIS CLIP PLACEMENT;  Surgeon: Irving Copas., MD;  Location: [place];  Service: Gastroenterology;;  . IMPLANTABLE CARDIOVERTER DEFIBRILLATOR IMPLANT N/A 07/06/2014   Procedure: St Jude IMPLANTABLE CARDIOVERTER DEFIBRILLATOR IMPLANT;  Surgeon: Deboraha Sprang, MD;  Location: [facility] CATH LAB;  Service: Cardiovascular;  Laterality: N/A;  . LEFT AND RIGHT HEART CATHETERIZATION WITH CORONARY ANGIOGRAM N/A 04/28/2013   Procedure: LEFT AND RIGHT HEART CATHETERIZATION WITH CORONARY ANGIOGRAM;  Surgeon: Burnell Blanks, MD;  Location: [facility] CATH LAB;  Service: Cardiovascular;  Laterality: N/A;  . MULTIPLE EXTRACTIONS WITH ALVEOLOPLASTY N/A 05/03/2013   Procedure: Extraction of tooth #s 3,4,5,6,8,9,27 with alveoloplast and maxillary left osseous tuberosity reduction;  Surgeon: Lenn Cal, DDS;  Location: [place];  Service: Oral Surgery;  Laterality: N/A;  . POLYPECTOMY  11/16/2019   Procedure: POLYPECTOMY;  Surgeon: Rush Landmark Telford Nab., MD;  Location: [place];  Service: Gastroenterology;;  . TEE WITHOUT CARDIOVERSION N/A 09/01/2019   Procedure: TRANSESOPHAGEAL  ECHOCARDIOGRAM (TEE);  Surgeon: Larey Dresser, MD;  Location: [facility] ENDOSCOPY;  Service: Cardiovascular;  Laterality: N/A;  . TOTAL HIP ARTHROPLASTY Right 11/30/2018   Procedure: TOTAL HIP ARTHROPLASTY ANTERIOR APPROACH;  Surgeon: Rod Can, MD;  Location: [facility];  Service: Orthopedics;  Laterality: Right;  Marland Kitchen VIDEO BRONCHOSCOPY WITH ENDOBRONCHIAL ULTRASOUND N/A 04/20/2019   Procedure: VIDEO BRONCHOSCOPY WITH ENDOBRONCHIAL ULTRASOUND;  Surgeon: Margaretha Seeds, MD;  Location: [place];  Service: Thoracic;  Laterality: N/A;    Social History:  reports that he quit smoking about 14 months ago. His smoking use included cigarettes. He started smoking about 53 years ago. He has a 60.00 pack-year smoking history. He has never used smokeless tobacco. He reports previous alcohol use. He reports current drug use. Drug: Marijuana.   Allergies  Allergen Reactions  . Penicillins Nausea And Vomiting, Swelling and Other (See Comments)    Per Dr Audria Nine Via phone 12/27/12 0400 SWELLING REACTION UNSPECIFIED   Did it involve swelling of the face/tongue/throat, SOB, or low BP? Unknown Did it involve sudden or severe rash/hives, skin peeling, or any reaction on the inside of your mouth or nose? Unknown Did you need to seek medical attention at a hospital or doctor's office? Unknown When did it  last happen?Childhood If all above answers are "NO", may proceed with cephalosporin use.    Family History  Problem Relation Age of Onset  . Diabetes Mother   . Hypertension Father   . Hypertension Other   . Heart attack Other        Brothers  . Diabetes Brother   . Heart attack Brother   . Heart attack Brother   . Stroke Neg Hx     Prior to Admission medications   Medication Sig Start Date End Date Taking? Authorizing Provider  albuterol (ACCUNEB) 1.25 MG/3ML nebulizer solution Take 1 ampule by nebulization every 6 (six) hours as needed for wheezing.    [provider]  albuterol  (VENTOLIN HFA) 108 (90 Base) MCG/ACT inhaler Inhale 1-2 puffs into the lungs every 6 (six) hours as needed for wheezing or shortness of breath. Pt needs appt for further refills. 05/23/20   Margaretha Seeds, MD  allopurinol (ZYLOPRIM) 300 MG tablet Take 300 mg by mouth daily.    [provider]  atorvastatin (LIPITOR) 20 MG tablet Take 20 mg by mouth daily.    [provider]  b complex vitamins tablet Take 1 tablet by mouth at bedtime.     [provider]  chlorproMAZINE (THORAZINE) 25 MG tablet Take 1 tablet (25 mg total) by mouth 3 (three) times daily as needed. 08/22/19   Curt Bears, MD  Cholecalciferol (VITAMIN D3) 25 MCG (1000 UT) CAPS Take 1,000 Units by mouth daily at 12 noon.     [provider]  dapagliflozin propanediol (FARXIGA) 10 MG TABS tablet Take 1 tablet (10 mg total) by mouth daily before breakfast. 04/15/20   Lyda Jester M, PA-C  DUREZOL 0.05 % EMUL  03/27/20   [provider]  ELIQUIS 5 MG TABS tablet Take 5 mg by mouth 2 (two) times daily.  07/20/19   [provider]  ferrous gluconate (FERGON) 324 MG tablet Take 1 tablet (324 mg total) by mouth daily with breakfast. 11/18/19   Caren Griffins, MD  furosemide (LASIX) 20 MG tablet Take 2 tablets (40 mg total) by mouth 2 (two) times daily. 06/06/20   Clegg, Amy D, NP  gabapentin (NEURONTIN) 300 MG capsule 300 mg 3 (three) times daily.  04/09/20   [provider]  guaiFENesin-dextromethorphan (ROBITUSSIN DM) 100-10 MG/5ML syrup Take 5 mLs by mouth every 4 (four) hours as needed for cough.     [provider]  isosorbide mononitrate (IMDUR) 30 MG 24 hr tablet Take 1 tablet (30 mg total) by mouth in the morning and at bedtime. 06/06/20   Clegg, Amy D, NP  Melatonin 3 MG TABS Take 3 mg by mouth at bedtime.    [provider]  metoprolol succinate (TOPROL-XL) 25 MG 24 hr tablet Take 1 tablet (25 mg total) by mouth daily. 03/28/20   Larey Dresser, MD  nitroGLYCERIN (NITROSTAT) 0.4 MG SL tablet Place 0.4 mg under the tongue every 5 (five) minutes as needed for chest pain.  09/15/19   [provider]  ofloxacin (OCUFLOX) 0.3 % ophthalmic solution  03/26/20   [provider]  omeprazole (PRILOSEC) 40 MG capsule Take 40 mg by mouth daily.    [provider]  Oxycodone HCl 10 MG TABS  04/23/20   [provider]  potassium chloride SA (KLOR-CON) 20 MEQ tablet Take 1 tablet (20 mEq total) by mouth daily. 08/03/19   Fredia Sorrow, MD  senna (SENOKOT) 8.6 MG TABS tablet  Take 1 tablet (8.6 mg total) by mouth 2 (two) times daily. 12/01/18   Swinteck, Aaron Edelman, MD  SPIRIVA HANDIHALER 18 MCG inhalation capsule Place 1 capsule into inhaler and inhale daily. 10/16/19   [provider]  spironolactone (ALDACTONE) 25 MG tablet Take 0.5 tablets (12.5 mg total) by mouth daily. 05/08/20 08/06/20  Larey Dresser, MD  sucralfate (CARAFATE) 1 g tablet Take 1 tablet (1 g total) by mouth 4 (four) times daily -  with meals and at bedtime. 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     [provider]    Physical Exam: BP 109/87   Pulse 100   Temp (!) 97.1 F (36.2 C) (Rectal)   Resp 15   SpO2 100%   . General: 69 y.o. year-old male well developed well nourished in no acute distress.  Alert and oriented x3. Marland Kitchen HEENT: Dry mucous membrane.  NCAT, EOMI . Neck: Supple, trachea medial . Cardiovascular: Tachycardia.  AICD insertion site noted. No thyromegaly or JVD noted.  2/4 pulses in all 4 extremities. Marland Kitchen Respiratory: Clear to auscultation with no wheezes or rales. Good inspiratory effort. . Abdomen: Soft nontender nondistended with normal bowel sounds x4 quadrants. . Muskuloskeletal: No cyanosis, clubbing or edema noted bilaterally . Neuro: CN II-XII intact,  strength, sensation, reflexes . Skin: Abrasion of left great toe (phalanx).  No rashes noted . Psychiatry: Judgement and insight appear normal. Mood is appropriate for condition and setting          Labs on Admission:  Basic Metabolic Panel: Recent Labs  Lab 06/05/2020 1134  NA 130*  K 6.1*  CL 98  CO2 13*  GLUCOSE 73  BUN 66*  CREATININE 2.58*  CALCIUM 9.9   Liver Function Tests: Recent Labs  Lab 06/12/2020 1134  AST 113*  ALT 54*  ALKPHOS 87  BILITOT 4.6*  PROT 6.7  ALBUMIN 2.6*   No results for input(s): LIPASE, AMYLASE in the last 168 hours. No results for input(s): AMMONIA in the last 168 hours. CBC: Recent Labs  Lab 06/09/2020 1134  WBC 15.8*  NEUTROABS 13.9*  HGB 11.6*  HCT 39.0  MCV 90.3  PLT 141*   Cardiac Enzymes: No results for input(s): CKTOTAL, CKMB, CKMBINDEX, TROPONINI in the last 168 hours.  BNP (last 3 results) Recent Labs    03/05/20 1158 06/06/20 1135 05/23/2020 1134  BNP 285.0* 1,398.7* 1,219.2*    ProBNP (last 3 results) No results for input(s): PROBNP in the last 8760 hours.  CBG: No results for input(s): GLUCAP in the last 168 hours.  Radiological Exams on Admission: DG Chest Port 1 View  Result Date: 06/07/2020 CLINICAL DATA:  Left sided chest pain EXAM: PORTABLE CHEST 1 VIEW COMPARISON:  01/05/2020 FINDINGS: Band like area of consolidation in the left upper lung extending from the periphery to the suprahilar region. Surgical clips in the left hilum. Right lung is clear. No pleural effusion or pneumothorax. Stable cardiomediastinal silhouette. Prior CABG. IMPRESSION: Band like area of consolidation in the left upper lung extending from the periphery to the suprahilar region. Overall appearance is similar to the prior examination of 01/05/2020. This likely reflects post radiation changes. No acute cardiopulmonary disease. Electronically Signed   By: Kathreen Devoid   On: 06/12/2020 11:52    EKG: I independently viewed the EKG done and  my findings are as followed: Ectopic atrial tachycardia at a rate of 102 bpm, QTc 595ms, RBBB  Assessment/Plan Present on Admission: . Altered mental status . Dehydration . Coronary atherosclerosis of native coronary artery . COPD (chronic obstructive pulmonary disease) ([place]) . Prolonged QT interval . Atrial fibrillation (HCC)  Active Problems:   COPD (chronic obstructive pulmonary disease) (HCC)   CHF, acute (HCC)   Coronary atherosclerosis of native coronary artery   S/P CABG x 5   Atrial fibrillation (HCC)   Atypical chest pain   Chronic congestive heart failure (HCC)   Dehydration   Altered mental status   Prolonged QT interval   Hyperkalemia   Hypothermia   Leukocytosis   Thrombocytopenia (HCC)   Metabolic acidosis   Elevated brain natriuretic peptide (BNP) level   Supratherapeutic INR   Serum total bilirubin elevated   Elevated liver enzymes   Hypoalbuminemia   Lactic acidosis   Toe abrasion, left, initial encounter   Acute kidney injury superimposed on CKD (HCC)   GERD (gastroesophageal reflux disease)   Altered mental status in the setting of severe sepsis with unknown source at this time Metabolic acidosis in the setting of above Patient was hypothermic (temperature 96.74F), tachypneic (RR > 20) and WBC was greater than 12 (15.8), patient meets SIRS criteria, though with no known source at this time Lactic acid was 4.3 1 dose of  aztreonam was initially given pending procalcitonin.  Procalcitonin resulted to 1.22, continue aztreonam at this time with plan to de-escalate/stop antibiotics based on blood culture, urine Legionella, strep pneumo Continue gentle IV hydration and monitor for fluid overload (considering patient's history of CHF)  Lactic acidosis possibly secondary to multifactorial including above and dehydration Dehydration Continue IV hydration as described above  Hypothermia possibly due to sepsis above Continue Bair hugger TSH will be  checked  Hyperkalemia in the setting of prolonged QT interval QTc 597 ms K+ 6.1; calcium gluconate was given to stabilize Lokelma was given in the ED. Avoid QT prolonging drugs Magnesium level will be checked Continue to monitor K+ level  Supratherapeutic INR INR 5.5, patient was not on warfarin, however he was on Eliquis and this will be held at this time Continue to monitor INR  Acute kidney injury on CKD stage IV BUN to creatinine 66/2.58 (baseline creatinine at 1.4-1.8) Renally adjust medications, avoid nephrotoxic agents/dehydration/hypotension  Elevated BNP BNP 1219.2 (this was 1398.7 about 7 days ago) Reliability uncertain at this time due to acute kidney injury on CKD No recent EKG changes Continue telemetry Consider cardiology consult for worsening of symptoms  Elevated troponin possibly secondary to type II demand ischemia Atypical chest pain Chest pain was reproductive No EKG changes noted Troponin x1 34 > 35 Continue Tylenol 650 mg every 6 hours as needed Imdur and nitroglycerin will be temporarily held due to soft BP Consider cardiology consult for worsening of symptoms  Elevated liver enzymes AST 113, ALT 54 Possibly due to shock liver Alcohol level will be checked Continue to monitor liver enzymes  Elevated bilirubin Total bilirubin 4.6 Direct bilirubin will be checked  Hypoalbuminemia possibly secondary to moderate protein calorie malnutrition Albumin 2.6; protein supplement will be provided  Left toe abrasion Continue wound care  Chronic CHF Echocardiogram done on 09/01/2019 showed LVEF of 25 to 30% with severely decreased LV function and LV demonstrating global hypokinesis. Lasix, Toprol, Aldactone will be held at this time due to soft BP  CAD s/p CABG Continue Lipitor  Atrial fibrillation Eliquis will be held  at this time due to supratherapeutic INR Toprol will be held at this time due to soft BP Patient has AICD; continue  telemetry  GERD Continue Protonix and Carafate  Type 2 diabetes mellitus Continue insulin sliding scale and hypoglycemic protocol Wilder Glade will be held at this time  DVT prophylaxis: SCDs  Code Status: Full code  Family Communication:  None at bedside  Disposition Plan:  Patient is from:                        home Anticipated DC to:                   SNF or family members home Anticipated DC date:               2-3 days Anticipated DC barriers:          Patient is unstable to be discharged at this time due to presumed sepsis, hypothermia, hyperkalemia and prolonged QT that requires inpatient management  Consults called: None  Admission status: Inpatient   Bernadette Hoit MD [facility]  06/03/2020, 3:43 PM

## 2020-06-13 NOTE — ED Triage Notes (Signed)
Pt presents from private residence w/ c/o worsening CP x 5-6 weeks. Pt EMS, pt caregiver noticed pt was increasingly lethargic this AM and called ambulance. Enroute, EMS observed ST elevation on EKG.   On arrival, MD at bedside. Pt denying CP or SOB. Oriented only to name and place.

## 2020-06-13 NOTE — ED Notes (Signed)
Failed attempt to collect [facility]

## 2020-06-14 ENCOUNTER — Other Ambulatory Visit: Payer: Self-pay

## 2020-06-14 LAB — TSH: TSH: 3.03 u[IU]/mL (ref 0.350–4.500)

## 2020-06-14 LAB — CBC
HCT: 38.3 % — ABNORMAL LOW (ref 39.0–52.0)
Hemoglobin: 11.1 g/dL — ABNORMAL LOW (ref 13.0–17.0)
MCH: 25.5 pg — ABNORMAL LOW (ref 26.0–34.0)
MCHC: 29 g/dL — ABNORMAL LOW (ref 30.0–36.0)
MCV: 88 fL (ref 80.0–100.0)
Platelets: 170 10*3/uL (ref 150–400)
RBC: 4.35 MIL/uL (ref 4.22–5.81)
RDW: 20.4 % — ABNORMAL HIGH (ref 11.5–15.5)
WBC: 16.9 10*3/uL — ABNORMAL HIGH (ref 4.0–10.5)
nRBC: 2.3 % — ABNORMAL HIGH (ref 0.0–0.2)

## 2020-06-14 LAB — BILIRUBIN, DIRECT: Bilirubin, Direct: 2.9 mg/dL — ABNORMAL HIGH (ref 0.0–0.2)

## 2020-06-14 LAB — BLOOD CULTURE ID PANEL (REFLEXED) - BCID2

## 2020-06-14 LAB — COMPREHENSIVE METABOLIC PANEL
ALT: 79 U/L — ABNORMAL HIGH (ref 0–44)
AST: 160 U/L — ABNORMAL HIGH (ref 15–41)
Albumin: 2.5 g/dL — ABNORMAL LOW (ref 3.5–5.0)
Alkaline Phosphatase: 86 U/L (ref 38–126)
Anion gap: 19 — ABNORMAL HIGH (ref 5–15)
BUN: 67 mg/dL — ABNORMAL HIGH (ref 8–23)
CO2: 14 mmol/L — ABNORMAL LOW (ref 22–32)
Calcium: 10.3 mg/dL (ref 8.9–10.3)
Chloride: 101 mmol/L (ref 98–111)
Creatinine, Ser: 2.96 mg/dL — ABNORMAL HIGH (ref 0.61–1.24)
GFR, Estimated: 22 mL/min — ABNORMAL LOW (ref >60)
Glucose, Bld: 77 mg/dL (ref 70–99)
Potassium: 5.1 mmol/L (ref 3.5–5.1)
Sodium: 134 mmol/L — ABNORMAL LOW (ref 135–145)
Total Bilirubin: 4.9 mg/dL — ABNORMAL HIGH (ref 0.3–1.2)
Total Protein: 6.4 g/dL — ABNORMAL LOW (ref 6.5–8.1)

## 2020-06-14 LAB — GLUCOSE, CAPILLARY
Glucose-Capillary: 44 mg/dL — CL (ref 70–99)
Glucose-Capillary: 67 mg/dL — ABNORMAL LOW (ref 70–99)
Glucose-Capillary: 88 mg/dL (ref 70–99)
Glucose-Capillary: 84 mg/dL (ref 70–99)
Glucose-Capillary: 103 mg/dL — ABNORMAL HIGH (ref 70–99)
Glucose-Capillary: 112 mg/dL — ABNORMAL HIGH (ref 70–99)
Glucose-Capillary: 142 mg/dL — ABNORMAL HIGH (ref 70–99)
Glucose-Capillary: 115 mg/dL — ABNORMAL HIGH (ref 70–99)

## 2020-06-14 LAB — PROTIME-INR
INR: 6.4 (ref 0.8–1.2)
Prothrombin Time: 54.5 seconds — ABNORMAL HIGH (ref 11.4–15.2)

## 2020-06-14 LAB — LACTIC ACID, PLASMA
Lactic Acid, Venous: 7.6 mmol/L (ref 0.5–1.9)
Lactic Acid, Venous: 8.3 mmol/L (ref 0.5–1.9)

## 2020-06-14 LAB — PHOSPHORUS: Phosphorus: 6 mg/dL — ABNORMAL HIGH (ref 2.5–4.6)

## 2020-06-14 LAB — STREP PNEUMONIAE URINARY ANTIGEN: Strep Pneumo Urinary Antigen: NEGATIVE

## 2020-06-14 LAB — APTT: aPTT: 49 seconds — ABNORMAL HIGH (ref 24–36)

## 2020-06-14 LAB — PROCALCITONIN: Procalcitonin: 1.38 ng/mL

## 2020-06-14 LAB — MAGNESIUM: Magnesium: 1.7 mg/dL (ref 1.7–2.4)

## 2020-06-14 MED ORDER — ALBUTEROL SULFATE (2.5 MG/3ML) 0.083% IN NEBU
2.5000 mg | INHALATION_SOLUTION | Freq: Two times a day (BID) | RESPIRATORY_TRACT | Status: DC
  Administered 2020-06-14: 19:00:00 2.5 mg via RESPIRATORY_TRACT
  Filled 2020-06-14: qty 3

## 2020-06-14 MED ORDER — DEXTROSE-NACL 5-0.9 % IV SOLN: INTRAVENOUS | Status: DC

## 2020-06-14 MED ORDER — PHYTONADIONE 5 MG PO TABS
5.0000 mg | ORAL_TABLET | Freq: Once | ORAL | Status: AC
  Administered 2020-06-14: 17:00:00 5 mg via ORAL
  Filled 2020-06-14: qty 1

## 2020-06-14 MED ORDER — DEXTROSE-NACL 5-0.9 % IV SOLN
INTRAVENOUS | Status: AC
  Administered 2020-06-14: 04:00:00 75 mL/h via INTRAVENOUS

## 2020-06-14 MED ORDER — DEXTROSE 50 % IV SOLN
INTRAVENOUS | Status: AC
  Administered 2020-06-14: 02:00:00 25 mL via INTRAVENOUS
  Filled 2020-06-14: qty 50

## 2020-06-14 MED ORDER — SODIUM CHLORIDE 0.9 % IV SOLN
2.0000 g | INTRAVENOUS | Status: DC
  Administered 2020-06-14 – 2020-06-15 (×2): 2 g via INTRAVENOUS
  Filled 2020-06-14 (×2): qty 2

## 2020-06-14 NOTE — Progress Notes (Signed)
[facility]  Progress Note   Spoke with Dr. Hayes Ludwig. Patient had a penicillin allergy in childhood that was non-specific swelling and nausea/vomiting. Since that time, he has tolerated Zosyn in July 2014. We will switch him from aztreonam to cefepime 2 gm IV Q 24 hours for sepsis coverage due to low allergy risk.    Jimmy Footman, PharmD, BCPS, BCIDP Infectious Diseases Clinical Pharmacist Phone: [phone] 06/14/2020 3:25 PM

## 2020-06-14 NOTE — Progress Notes (Addendum)
PROGRESS NOTE    [name] [name].  EXH:[id number] DOB: July 15, 1950 DOA: 06/12/2020 PCP: [name], PA-C   Brief Narrative:  [name] [name]. is a 69 y.o. male with medical history significant for CAD s/p CABG, AICD, CHF, COPD, lung cancer (in remission) who was brought to the ED via [place] for STEMI.  Patient was unable to provide history regarding why he came to the ED, he only referred to reproducible chest pain.  Most of the history was obtained from the ED physician and ED medical record.  Per report, patient has been having several weeks of intermittent chest pain and he presented to [facility] ED on 11/30 due to same complaint of chest pain, he was transferred to [facility] for STEMI at that time, cardiac catheterization done showed diffuse CAD with no need for any intervention. EMS was activated due to patient being weak and not getting out of bed for a few days. Patient found to have hypothermia with temp of 96.620F, sepsis of unclear etiology but patient dehydrated with possible consolidation on CXR and cough.  Started on [facility] and Aztreonam.  Blood sugar low overnight, started on D5 drip.   Assessment & Plan:   Active Problems:   COPD (chronic obstructive pulmonary disease) (HCC)   CHF, acute (HCC)   Coronary atherosclerosis of native coronary artery   S/P CABG x 5   Atrial fibrillation (HCC)   Atypical chest pain   Chronic congestive heart failure (HCC)   Dehydration   Altered mental status   Prolonged QT interval   Hyperkalemia   Hypothermia   Leukocytosis   Thrombocytopenia (HCC)   Metabolic acidosis   Elevated brain natriuretic peptide (BNP) level   Supratherapeutic INR   Serum total bilirubin elevated   Elevated liver enzymes   Hypoalbuminemia   Lactic acidosis   Toe abrasion, left, initial encounter   Acute kidney injury superimposed on CKD (HCC)   GERD (gastroesophageal reflux disease)   Severe sepsis (HCC)    Altered mental status in the setting of  severe sepsis with unknown source at this time Metabolic acidosis in the setting of above Patient was hypothermic (temperature 96.620F), tachypneic (RR > 20) and WBC was greater than 12 (15.8), patient meets SIRS criteria, though with no known source at this time Lactic acid was 4.3 1 dose of  aztreonam was initially given pending procalcitonin.  Procalcitonin resulted to 1.22, continue aztreonam at this time with plan to de-escalate/stop antibiotics based on blood culture, urine Legionella, strep pneumo Continue gentle IV hydration and monitor for fluid overload (considering patient's history of CHF) 38/10/17 Acute metabolic encephalopathy has resolved. Patient vernal now, reports having a productive cough and dyspnea.  RT eval with CPT ordered as well as albuterol neb tx.  Per Pharmacy, patient has tolerated Zosyn in the past, will change Abx from aztreonam to Cefepime and monitor. Source of sepsis likely pneumonia, poa.   Blood culture positive 1/4 blood culture positive for coag neg staph.  Will continue cefepime as per Pharmacy, there was no resistant listed per micro report. Will hold off on starting vancomycin for now.    Lactic acidosis possibly secondary to multifactorial including above and dehydration Dehydration Continue IV hydration as described above 12/24 Continue IV fluids.   Hypothermia possibly due to sepsis above Continue Bair hugger TSH will be checked 12/24 Due to sepsis. Temp is improving.   Hyperkalemia in the setting of prolonged QT interval QTc 597 ms K+ 6.1; calcium gluconate was given  to stabilize Lokelma was given in the ED. Avoid QT prolonging drugs Magnesium level will be checked Continue to monitor K+ level 12/24 Hyperkalemia improving.   Supratherapeutic INR INR 5.5, patient was not on warfarin, however he was on Eliquis and this will be held at this time Continue to monitor INR 12/24 No active bleeding. INR remains elevated. Will order vitamin K  due to likely vitamin K deficiency (in addition to effect of Eliquis).   Acute kidney injury on CKD stage IV BUN to creatinine 66/2.58 (baseline creatinine at 1.4-1.8) Renally adjust medications, avoid nephrotoxic agents/dehydration/hypotension 12/24 Continue IV fluids, bladder scan ordered.   Elevated BNP BNP 1219.2 (this was 1398.7 about 7 days ago) Reliability uncertain at this time due to acute kidney injury on CKD No recent EKG changes Continue telemetry Consider cardiology consult for worsening of symptoms 12/24 Patient appears dry on exam, continue monitoring closely due to recent IV fluids.   Elevated troponin possibly secondary to type II demand ischemia Atypical chest pain Chest pain was reproductive No EKG changes noted Troponin x1 34 > 35 Continue Tylenol 650 mg every 6 hours as needed Imdur and nitroglycerin will be temporarily held due to soft BP Consider cardiology consult for worsening of symptoms  Elevated liver enzymes AST 113, ALT 54 Possibly due to shock liver Alcohol level will be checked Continue to monitor liver enzymes  Elevated bilirubin Total bilirubin 4.6 Direct bilirubin will be checked 12/24 Trend level, consider [place] abd. Patient has no abdominal pain.   Hypoalbuminemia possibly secondary to moderate protein calorie malnutrition Albumin 2.6; protein supplement will be provided  Left toe abrasion Continue wound care  Chronic CHF Echocardiogram done on 09/01/2019 showed LVEF of 25 to 30% with severely decreased LV function and LV demonstrating global hypokinesis. Lasix, Toprol, Aldactone will be held at this time due to soft BP  CAD s/p CABG Continue Lipitor  Atrial fibrillation Eliquis will be held at this time due to supratherapeutic INR Toprol will be held at this time due to soft BP Patient has AICD; continue telemetry  GERD Continue Protonix and Carafate  Type 2 diabetes mellitus Continue insulin sliding scale and  hypoglycemic protocol [place] will be held at this time 12/24 Patient started on D5 drip due to low blood sugar  Suspected protein calorie malnutrition Will order dietitian consult  DVT prophylaxis: SCDs  Code Status: Full code  Family Communication:  None at bedside, attempt to contact sister via phone but not able at this time.   Disposition Plan:  Patient is from:home Anticipated DC to:SNF or family members home, need PT and OT eval Anticipated DC date:3 days Anticipated DC barriers:Patient is unstable to be discharged at this time due to presumed sepsis, hypothermia, hyperkalemia and prolonged QT that requires inpatient management   Consultants:   None   Procedures:   None  Antimicrobials:   Aztreonam 12/23--12/24  Cefepime 12/24--    Subjective: Patient reports having a productive cough and dyspnea. Denies abdominal pin.   Objective: Vitals:   06/14/20 0751 06/14/20 0800 06/14/20 1300 06/14/20 1400  BP: 106/87 119/70 113/78 120/80  Pulse: (!) 101 99 98 99  Resp: 18  (!) 22 (!) 21  Temp: (!) 97.3 F (36.3 C)     TempSrc: Rectal     SpO2: 100%     Weight:      Height:        Intake/Output Summary (Last 24 hours) at 06/14/2020 1531 Last data filed at 06/14/2020 0600 Gross per  24 hour  Intake 589.86 ml  Output 100 ml  Net 489.86 ml   Filed Weights   05/31/2020 2100  Weight: 79.8 kg    Examination:  General exam: Appears calm and comfortable  Respiratory system: Rhonchi bilaterally, nl rate Cardiovascular system: S1 & S2 present, mild tachycardia.  Gastrointestinal system: Abdomen is nondistended, soft and nontender. Central nervous system: Alert and oriented. No focal neurological deficits. Extremities: Symmetric 5 x 5 power. Skin: No rashes, lesions or ulcers per limited exam.  Psychiatry: Mood & affect appropriate.     Data Reviewed: I have personally reviewed  following labs and imaging studies  CBC: Recent Labs  Lab 06/06/2020 1134 06/14/20 0044  WBC 15.8* 16.9*  NEUTROABS 13.9*  --   HGB 11.6* 11.1*  HCT 39.0 38.3*  MCV 90.3 88.0  PLT 141* 892   Basic Metabolic Panel: Recent Labs  Lab 05/30/2020 1134 06/14/20 0044  NA 130* 134*  K 6.1* 5.1  CL 98 101  CO2 13* 14*  GLUCOSE 73 77  BUN 66* 67*  CREATININE 2.58* 2.96*  CALCIUM 9.9 10.3  MG  --  1.7  PHOS  --  6.0*   GFR: Estimated Creatinine Clearance: 24.2 mL/min (A) (by C-G formula based on SCr of 2.96 mg/dL (H)). Liver Function Tests: Recent Labs  Lab 06/10/2020 1134 06/14/20 0044  AST 113* 160*  ALT 54* 79*  ALKPHOS 87 86  BILITOT 4.6* 4.9*  PROT 6.7 6.4*  ALBUMIN 2.6* 2.5*   No results for input(s): LIPASE, AMYLASE in the last 168 hours. No results for input(s): AMMONIA in the last 168 hours. Coagulation Profile: Recent Labs  Lab 05/26/2020 1148 06/14/20 0044  INR 5.5* 6.4*   Cardiac Enzymes: No results for input(s): CKTOTAL, CKMB, CKMBINDEX, TROPONINI in the last 168 hours. BNP (last 3 results) No results for input(s): PROBNP in the last 8760 hours. HbA1C: Recent Labs    06/18/2020 2103  HGBA1C 5.2   CBG: Recent Labs  Lab 06/14/20 0234 06/14/20 0303 06/14/20 0520 06/14/20 0635 06/14/20 1156  GLUCAP 67* 88 84 103* 112*   Lipid Profile: No results for input(s): CHOL, HDL, LDLCALC, TRIG, CHOLHDL, LDLDIRECT in the last 72 hours. Thyroid Function Tests: Recent Labs    06/14/20 0044  TSH 3.030   Anemia Panel: No results for input(s): VITAMINB12, FOLATE, FERRITIN, TIBC, IRON, RETICCTPCT in the last 72 hours. Sepsis Labs: Recent Labs  Lab 06/01/2020 1409 06/12/2020 1523 05/30/2020 1634 06/03/2020 2319 06/14/20 0044  PROCALCITON  --   --  1.22  --  1.38  LATICACIDVEN 4.3* 4.5*  --  7.6* 8.3*    Recent Results (from the past 240 hour(s))  Resp Panel by RT-PCR (Flu A&B, Covid) Nasopharyngeal Swab     Status: None   Collection Time: 06/14/2020 11:13 AM    Specimen: Nasopharyngeal Swab; Nasopharyngeal(NP) swabs in vial transport medium  Result Value Ref Range Status   SARS Coronavirus 2 by RT PCR NEGATIVE NEGATIVE Final    Comment: (NOTE) SARS-CoV-2 target nucleic acids are NOT DETECTED.  The SARS-CoV-2 RNA is generally detectable in upper respiratory specimens during the acute phase of infection. The lowest concentration of SARS-CoV-2 viral copies this assay can detect is 138 copies/mL. A negative result does not preclude SARS-Cov-2 infection and should not be used as the sole basis for treatment or other patient management decisions. A negative result may occur with  improper specimen collection/handling, submission of specimen other than nasopharyngeal swab, presence of viral mutation(s) within the areas  targeted by this assay, and inadequate number of viral copies(<138 copies/mL). A negative result must be combined with clinical observations, patient history, and epidemiological information. The expected result is Negative.  Fact Sheet for Patients:  EntrepreneurPulse.com.au  Fact Sheet for Healthcare Providers:  IncredibleEmployment.be  This test is no t yet approved or cleared by the [place] FDA and  has been authorized for detection and/or diagnosis of SARS-CoV-2 by FDA under an Emergency Use Authorization (EUA). This EUA will remain  in effect (meaning this test can be used) for the duration of the COVID-19 declaration under Section 564(b)(1) of the Act, 21 U.S.C.section 360bbb-3(b)(1), unless the authorization is terminated  or revoked sooner.       Influenza A by PCR NEGATIVE NEGATIVE Final   Influenza B by PCR NEGATIVE NEGATIVE Final    Comment: (NOTE) The Xpert Xpress SARS-CoV-2/FLU/RSV plus assay is intended as an aid in the diagnosis of influenza from Nasopharyngeal swab specimens and should not be used as a sole basis for treatment. Nasal washings and aspirates are  unacceptable for Xpert Xpress SARS-CoV-2/FLU/RSV testing.  Fact Sheet for Patients: EntrepreneurPulse.com.au  Fact Sheet for Healthcare Providers: IncredibleEmployment.be  This test is not yet approved or cleared by the [place] FDA and has been authorized for detection and/or diagnosis of SARS-CoV-2 by FDA under an Emergency Use Authorization (EUA). This EUA will remain in effect (meaning this test can be used) for the duration of the COVID-19 declaration under Section 564(b)(1) of the Act, 21 U.S.C. section 360bbb-3(b)(1), unless the authorization is terminated or revoked.  Performed at [facility], [place] [address] [place], [place] [address]   Blood culture (routine x 2)     Status: None (Preliminary result)   Collection Time: 06/03/2020  1:55 PM   Specimen: BLOOD RIGHT WRIST  Result Value Ref Range Status   Specimen Description BLOOD RIGHT WRIST  Final   Special Requests   Final    BOTTLES DRAWN AEROBIC AND ANAEROBIC Blood Culture adequate volume   Culture  Setup Time   Final    GRAM POSITIVE COCCI IN CLUSTERS ANAEROBIC BOTTLE ONLY Organism ID to follow Performed at [facility], [place] [address] [place], [place] [address]    Culture GRAM POSITIVE COCCI  Final   Report Status PENDING  Incomplete         Radiology Studies: DG Chest Port 1 View  Result Date: 05/26/2020 CLINICAL DATA:  Left sided chest pain EXAM: PORTABLE CHEST 1 VIEW COMPARISON:  01/05/2020 FINDINGS: Band like area of consolidation in the left upper lung extending from the periphery to the suprahilar region. Surgical clips in the left hilum. Right lung is clear. No pleural effusion or pneumothorax. Stable cardiomediastinal silhouette. Prior CABG. IMPRESSION: Band like area of consolidation in the left upper lung extending from the periphery to the suprahilar region. Overall appearance is similar to the prior examination of 01/05/2020. This likely reflects  post radiation changes. No acute cardiopulmonary disease. Electronically Signed   By: Kathreen Devoid   On: 05/31/2020 11:52        Scheduled Meds: . albuterol  2.5 mg Nebulization BID  . atorvastatin  20 mg Oral Daily  . feeding supplement (GLUCERNA SHAKE)  237 mL Oral TID BM  . insulin aspart  0-5 Units Subcutaneous QHS  . insulin aspart  0-9 Units Subcutaneous TID WC  . pantoprazole  40 mg Oral Daily  . sucralfate  1 g Oral TID WC & HS  .  umeclidinium bromide  1 puff Inhalation Daily   Continuous Infusions: . ceFEPime (MAXIPIME) IV       LOS: 1 day    Time spent: 35 minutes    Blain Pais, MD [facility]   If 7PM-7AM, please contact night-coverage [email or web address] Password [facility] 06/14/2020, 3:31 PM

## 2020-06-14 NOTE — Progress Notes (Signed)
CRITICAL VALUE ALERT  Critical Value:  INR 6.4  Date & Time Notied:  06/14/20 @ 7129  Provider Notified: Dr. Gwen Pounds  Orders Received/Actions taken: Md put in orders for IVF and check CBG at 0400. Will continue to monitor.

## 2020-06-14 NOTE — Progress Notes (Signed)
PROGRESS NOTE    [name] [name].  ZOX:[id number] DOB: 14-Dec-1950 DOA: 05/23/2020 PCP: [name], PA-C   Brief Narrative:  [name] [name]. is a 69 y.o. male with medical history significant for CAD s/p CABG, AICD, CHF, COPD, lung cancer (in remission) who was brought to the ED via [place] for STEMI.  Patient was unable to provide history regarding why he came to the ED, he only referred to reproducible chest pain.  Most of the history was obtained from the ED physician and ED medical record.  Per report, patient has been having several weeks of intermittent chest pain and he presented to [facility] ED on 11/30 due to same complaint of chest pain, he was transferred to [facility] for STEMI at that time, cardiac catheterization done showed diffuse CAD with no need for any intervention. EMS was activated due to patient being weak and not getting out of bed for a few days. Patient found to have hypothermia with temp of 96.57F, sepsis of unclear etiology but patient dehydrated with possible consolidation on CXR and cough.  Started on [facility] and Aztreonam.  Blood sugar low overnight, started on D5 drip.   Assessment & Plan:   Active Problems:   COPD (chronic obstructive pulmonary disease) (HCC)   CHF, acute (HCC)   Coronary atherosclerosis of native coronary artery   S/P CABG x 5   Atrial fibrillation (HCC)   Atypical chest pain   Chronic congestive heart failure (HCC)   Dehydration   Altered mental status   Prolonged QT interval   Hyperkalemia   Hypothermia   Leukocytosis   Thrombocytopenia (HCC)   Metabolic acidosis   Elevated brain natriuretic peptide (BNP) level   Supratherapeutic INR   Serum total bilirubin elevated   Elevated liver enzymes   Hypoalbuminemia   Lactic acidosis   Toe abrasion, left, initial encounter   Acute kidney injury superimposed on CKD (HCC)   GERD (gastroesophageal reflux disease)   Severe sepsis (HCC)    Altered mental status in the setting of  severe sepsis with unknown source at this time Metabolic acidosis in the setting of above Patient was hypothermic (temperature 96.57F), tachypneic (RR > 20) and WBC was greater than 12 (15.8), patient meets SIRS criteria, though with no known source at this time Lactic acid was 4.3 1 dose of  aztreonam was initially given pending procalcitonin.  Procalcitonin resulted to 1.22, continue aztreonam at this time with plan to de-escalate/stop antibiotics based on blood culture, urine Legionella, strep pneumo Continue gentle IV hydration and monitor for fluid overload (considering patient's history of CHF) 81/19/14 Acute metabolic encephalopathy has resolved. Patient vernal now, reports having a productive cough and dyspnea.  RT eval with CPT ordered as well as albuterol neb tx.  Per Pharmacy, patient has tolerated Zosyn in the past, will change Abx from aztreonam to Cefepime and monitor. Source of sepsis likely pneumonia, poa.   Blood culture positive 1/4 blood culture positive for coag neg staph.  Will continue cefepime as per Pharmacy, there was no resistant listed per micro report. Will hold off on starting vancomycin for now.    Lactic acidosis possibly secondary to multifactorial including above and dehydration Dehydration Continue IV hydration as described above 12/24 Continue IV fluids.   Hypothermia possibly due to sepsis above Continue Bair hugger TSH will be checked 12/24 Due to sepsis. Temp is improving.   Hyperkalemia in the setting of prolonged QT interval QTc 597 ms K+ 6.1; calcium gluconate was given  to stabilize Lokelma was given in the ED. Avoid QT prolonging drugs Magnesium level will be checked Continue to monitor K+ level 12/24 Hyperkalemia improving.   Supratherapeutic INR INR 5.5, patient was not on warfarin, however he was on Eliquis and this will be held at this time Continue to monitor INR 12/24 No active bleeding. INR remains elevated. Will order vitamin K  due to likely vitamin K deficiency (in addition to effect of Eliquis).   Acute kidney injury on CKD stage IV BUN to creatinine 66/2.58 (baseline creatinine at 1.4-1.8) Renally adjust medications, avoid nephrotoxic agents/dehydration/hypotension 12/24 Continue IV fluids, bladder scan ordered.   Elevated BNP BNP 1219.2 (this was 1398.7 about 7 days ago) Reliability uncertain at this time due to acute kidney injury on CKD No recent EKG changes Continue telemetry Consider cardiology consult for worsening of symptoms 12/24 Patient appears dry on exam, continue monitoring closely due to recent IV fluids.   Elevated troponin possibly secondary to type II demand ischemia Atypical chest pain Chest pain was reproductive No EKG changes noted Troponin x1 34 > 35 Continue Tylenol 650 mg every 6 hours as needed Imdur and nitroglycerin will be temporarily held due to soft BP Consider cardiology consult for worsening of symptoms  Elevated liver enzymes AST 113, ALT 54 Possibly due to shock liver Alcohol level will be checked Continue to monitor liver enzymes  Elevated bilirubin Total bilirubin 4.6 Direct bilirubin will be checked 12/24 Trend level, consider [place] abd. Patient has no abdominal pain.   Hypoalbuminemia possibly secondary to moderate protein calorie malnutrition Albumin 2.6; protein supplement will be provided  Left toe abrasion Continue wound care  Chronic CHF Echocardiogram done on 09/01/2019 showed LVEF of 25 to 30% with severely decreased LV function and LV demonstrating global hypokinesis. Lasix, Toprol, Aldactone will be held at this time due to soft BP  CAD s/p CABG Continue Lipitor  Atrial fibrillation Eliquis will be held at this time due to supratherapeutic INR Toprol will be held at this time due to soft BP Patient has AICD; continue telemetry  GERD Continue Protonix and Carafate  Type 2 diabetes mellitus Continue insulin sliding scale and  hypoglycemic protocol [place] will be held at this time 12/24 Patient started on D5 drip due to low blood sugar  Suspected protein calorie malnutrition Will order dietitian consult  Normocytic anemia, chronic, with hx of iron deficiency anemia Unclear if he has been taking iron supplementation at home Will check anemia panel  DVT prophylaxis: SCDs  Code Status: Full code  Family Communication:  None at bedside, attempt to contact sister via phone but not able at this time.   Disposition Plan:  Patient is from:home Anticipated DC to:SNF or family members home, need PT and OT eval Anticipated DC date:3 days Anticipated DC barriers:Patient is unstable to be discharged at this time due to presumed sepsis, hypothermia, hyperkalemia and prolonged QT that requires inpatient management   Consultants:   None   Procedures:   None  Antimicrobials:   Aztreonam 12/23--12/24  Cefepime 12/24--    Subjective: Patient reports having a productive cough and dyspnea. Denies abdominal pin.   Objective: Vitals:   06/14/20 0751 06/14/20 0800 06/14/20 1300 06/14/20 1400  BP: 106/87 119/70 113/78 120/80  Pulse: (!) 101 99 98 99  Resp: 18  (!) 22 (!) 21  Temp: (!) 97.3 F (36.3 C)     TempSrc: Rectal     SpO2: 100%     Weight:  Height:        Intake/Output Summary (Last 24 hours) at 06/14/2020 1632 Last data filed at 06/14/2020 1345 Gross per 24 hour  Intake 709.86 ml  Output 100 ml  Net 609.86 ml   Filed Weights   05/30/2020 2100  Weight: 79.8 kg    Examination:  General exam: Appears calm and comfortable  Respiratory system: Rhonchi bilaterally, nl rate Cardiovascular system: S1 & S2 present, mild tachycardia.  Gastrointestinal system: Abdomen is nondistended, soft and nontender. Central nervous system: Alert and oriented. No focal neurological deficits. Extremities: Symmetric 5 x 5  power. Skin: No rashes, lesions or ulcers per limited exam.  Psychiatry: Mood & affect appropriate.     Data Reviewed: I have personally reviewed following labs and imaging studies  CBC: Recent Labs  Lab 06/21/2020 1134 06/14/20 0044  WBC 15.8* 16.9*  NEUTROABS 13.9*  --   HGB 11.6* 11.1*  HCT 39.0 38.3*  MCV 90.3 88.0  PLT 141* 740   Basic Metabolic Panel: Recent Labs  Lab 05/28/2020 1134 06/14/20 0044  NA 130* 134*  K 6.1* 5.1  CL 98 101  CO2 13* 14*  GLUCOSE 73 77  BUN 66* 67*  CREATININE 2.58* 2.96*  CALCIUM 9.9 10.3  MG  --  1.7  PHOS  --  6.0*   GFR: Estimated Creatinine Clearance: 24.2 mL/min (A) (by C-G formula based on SCr of 2.96 mg/dL (H)). Liver Function Tests: Recent Labs  Lab 05/25/2020 1134 06/14/20 0044  AST 113* 160*  ALT 54* 79*  ALKPHOS 87 86  BILITOT 4.6* 4.9*  PROT 6.7 6.4*  ALBUMIN 2.6* 2.5*   No results for input(s): LIPASE, AMYLASE in the last 168 hours. No results for input(s): AMMONIA in the last 168 hours. Coagulation Profile: Recent Labs  Lab 06/20/2020 1148 06/14/20 0044  INR 5.5* 6.4*   Cardiac Enzymes: No results for input(s): CKTOTAL, CKMB, CKMBINDEX, TROPONINI in the last 168 hours. BNP (last 3 results) No results for input(s): PROBNP in the last 8760 hours. HbA1C: Recent Labs    05/29/2020 2103  HGBA1C 5.2   CBG: Recent Labs  Lab 06/14/20 0234 06/14/20 0303 06/14/20 0520 06/14/20 0635 06/14/20 1156  GLUCAP 67* 88 84 103* 112*   Lipid Profile: No results for input(s): CHOL, HDL, LDLCALC, TRIG, CHOLHDL, LDLDIRECT in the last 72 hours. Thyroid Function Tests: Recent Labs    06/14/20 0044  TSH 3.030   Anemia Panel: No results for input(s): VITAMINB12, FOLATE, FERRITIN, TIBC, IRON, RETICCTPCT in the last 72 hours. Sepsis Labs: Recent Labs  Lab 06/17/2020 1409 06/18/2020 1523 05/22/2020 1634 05/30/2020 2319 06/14/20 0044  PROCALCITON  --   --  1.22  --  1.38  LATICACIDVEN 4.3* 4.5*  --  7.6* 8.3*    Recent  Results (from the past 240 hour(s))  Resp Panel by RT-PCR (Flu A&B, Covid) Nasopharyngeal Swab     Status: None   Collection Time: 06/07/2020 11:13 AM   Specimen: Nasopharyngeal Swab; Nasopharyngeal(NP) swabs in vial transport medium  Result Value Ref Range Status   SARS Coronavirus 2 by RT PCR NEGATIVE NEGATIVE Final    Comment: (NOTE) SARS-CoV-2 target nucleic acids are NOT DETECTED.  The SARS-CoV-2 RNA is generally detectable in upper respiratory specimens during the acute phase of infection. The lowest concentration of SARS-CoV-2 viral copies this assay can detect is 138 copies/mL. A negative result does not preclude SARS-Cov-2 infection and should not be used as the sole basis for treatment or other patient management decisions.  A negative result may occur with  improper specimen collection/handling, submission of specimen other than nasopharyngeal swab, presence of viral mutation(s) within the areas targeted by this assay, and inadequate number of viral copies(<138 copies/mL). A negative result must be combined with clinical observations, patient history, and epidemiological information. The expected result is Negative.  Fact Sheet for Patients:  EntrepreneurPulse.com.au  Fact Sheet for Healthcare Providers:  IncredibleEmployment.be  This test is no t yet approved or cleared by the [place] FDA and  has been authorized for detection and/or diagnosis of SARS-CoV-2 by FDA under an Emergency Use Authorization (EUA). This EUA will remain  in effect (meaning this test can be used) for the duration of the COVID-19 declaration under Section 564(b)(1) of the Act, 21 U.S.C.section 360bbb-3(b)(1), unless the authorization is terminated  or revoked sooner.       Influenza A by PCR NEGATIVE NEGATIVE Final   Influenza B by PCR NEGATIVE NEGATIVE Final    Comment: (NOTE) The Xpert Xpress SARS-CoV-2/FLU/RSV plus assay is intended as an aid in the  diagnosis of influenza from Nasopharyngeal swab specimens and should not be used as a sole basis for treatment. Nasal washings and aspirates are unacceptable for Xpert Xpress SARS-CoV-2/FLU/RSV testing.  Fact Sheet for Patients: EntrepreneurPulse.com.au  Fact Sheet for Healthcare Providers: IncredibleEmployment.be  This test is not yet approved or cleared by the [place] FDA and has been authorized for detection and/or diagnosis of SARS-CoV-2 by FDA under an Emergency Use Authorization (EUA). This EUA will remain in effect (meaning this test can be used) for the duration of the COVID-19 declaration under Section 564(b)(1) of the Act, 21 U.S.C. section 360bbb-3(b)(1), unless the authorization is terminated or revoked.  Performed at [facility], [place] [address], [place], [place] [address]   Blood culture (routine x 2)     Status: None (Preliminary result)   Collection Time: 06/10/2020  1:55 PM   Specimen: BLOOD RIGHT WRIST  Result Value Ref Range Status   Specimen Description BLOOD RIGHT WRIST  Final   Special Requests   Final    BOTTLES DRAWN AEROBIC AND ANAEROBIC Blood Culture adequate volume   Culture  Setup Time   Final    GRAM POSITIVE COCCI IN CLUSTERS ANAEROBIC BOTTLE ONLY Organism ID to follow Performed at [facility], [place] [address], [place], [place] [address]    Culture GRAM POSITIVE COCCI  Final   Report Status PENDING  Incomplete  Blood Culture ID Panel (Reflexed)     Status: Abnormal   Collection Time: 05/24/2020  1:55 PM  Result Value Ref Range Status   Enterococcus faecalis NOT DETECTED NOT DETECTED Final   Enterococcus Faecium NOT DETECTED NOT DETECTED Final   Listeria monocytogenes NOT DETECTED NOT DETECTED Final   Staphylococcus species DETECTED (A) NOT DETECTED Final    Comment: CRITICAL RESULT CALLED TO, READ BACK BY AND VERIFIED WITH: Tillman Sers PharmD 15:35 06/14/20 ([email or web address])    Staphylococcus aureus  (BCID) NOT DETECTED NOT DETECTED Final   Staphylococcus epidermidis NOT DETECTED NOT DETECTED Final   Staphylococcus lugdunensis NOT DETECTED NOT DETECTED Final   Streptococcus species NOT DETECTED NOT DETECTED Final   Streptococcus agalactiae NOT DETECTED NOT DETECTED Final   Streptococcus pneumoniae NOT DETECTED NOT DETECTED Final   Streptococcus pyogenes NOT DETECTED NOT DETECTED Final   A.calcoaceticus-baumannii NOT DETECTED NOT DETECTED Final   Bacteroides fragilis NOT DETECTED NOT DETECTED Final   Enterobacterales NOT DETECTED NOT DETECTED Final   Enterobacter cloacae complex  NOT DETECTED NOT DETECTED Final   Escherichia coli NOT DETECTED NOT DETECTED Final   Klebsiella aerogenes NOT DETECTED NOT DETECTED Final   Klebsiella oxytoca NOT DETECTED NOT DETECTED Final   Klebsiella pneumoniae NOT DETECTED NOT DETECTED Final   Proteus species NOT DETECTED NOT DETECTED Final   Salmonella species NOT DETECTED NOT DETECTED Final   Serratia marcescens NOT DETECTED NOT DETECTED Final   Haemophilus influenzae NOT DETECTED NOT DETECTED Final   Neisseria meningitidis NOT DETECTED NOT DETECTED Final   Pseudomonas aeruginosa NOT DETECTED NOT DETECTED Final   Stenotrophomonas maltophilia NOT DETECTED NOT DETECTED Final   Candida albicans NOT DETECTED NOT DETECTED Final   Candida auris NOT DETECTED NOT DETECTED Final   Candida glabrata NOT DETECTED NOT DETECTED Final   Candida krusei NOT DETECTED NOT DETECTED Final   Candida parapsilosis NOT DETECTED NOT DETECTED Final   Candida tropicalis NOT DETECTED NOT DETECTED Final   Cryptococcus neoformans/gattii NOT DETECTED NOT DETECTED Final    Comment: Performed at [facility], [place] [address] [place], [place] [address]         Radiology Studies: DG Chest Port 1 View  Result Date: 06/07/2020 CLINICAL DATA:  Left sided chest pain EXAM: PORTABLE CHEST 1 VIEW COMPARISON:  01/05/2020 FINDINGS: Band like area of consolidation in the left upper  lung extending from the periphery to the suprahilar region. Surgical clips in the left hilum. Right lung is clear. No pleural effusion or pneumothorax. Stable cardiomediastinal silhouette. Prior CABG. IMPRESSION: Band like area of consolidation in the left upper lung extending from the periphery to the suprahilar region. Overall appearance is similar to the prior examination of 01/05/2020. This likely reflects post radiation changes. No acute cardiopulmonary disease. Electronically Signed   By: Kathreen Devoid   On: 06/04/2020 11:52        Scheduled Meds: . albuterol  2.5 mg Nebulization BID  . atorvastatin  20 mg Oral Daily  . feeding supplement (GLUCERNA SHAKE)  237 mL Oral TID BM  . insulin aspart  0-5 Units Subcutaneous QHS  . insulin aspart  0-9 Units Subcutaneous TID WC  . pantoprazole  40 mg Oral Daily  . phytonadione  5 mg Oral Once  . sucralfate  1 g Oral TID WC & HS  . umeclidinium bromide  1 puff Inhalation Daily   Continuous Infusions: . ceFEPime (MAXIPIME) IV       LOS: 1 day    Time spent: 35 minutes    Blain Pais, MD [facility]   If 7PM-7AM, please contact night-coverage [email or web address] Password [email or web address] 06/14/2020, 4:32 PM

## 2020-06-14 NOTE — Progress Notes (Signed)
Patient admitted from ED last evening with CP and SOB, AMS. Upon admission to [facility] patient was A&O x4 . Around 2:15 am called in by patient that he was wet, reapplied condom cath, and patient felt very cold, did a rectal temp 97.8, BP 104/81, on 3 L O2, checked patient CBG was 44, gave 1/2 amp Dextrose, 15 minute check was 67, gave another 1/2 amp dextrose at 15 minute check was 87. Critical lab called to unit for INR of 6.4, text paged MD on call all information, she responded by putting in IVF and rechecks of CBG's. Will continue to monitor patient.

## 2020-06-14 NOTE — Evaluation (Signed)
Physical Therapy Evaluation Patient Details Name: [name] [name]. MRN: [id number] DOB: 1951-02-13 Today's Date: 06/14/2020   History of Present Illness  69 y.o. male with medical history significant for CAD s/p CABG, AICD, CHF, COPD, lung cancer (in remission) who was brought to the ED via [place] for STEMI. PT with recent cardiac cath showing diffuse CAD without need for intervention. Pt found to be hypothermic and dehydrated.  Clinical Impression  Pt presents to PT with deficits in functional mobility, activity tolerance, balance, gait, endurance, strength, power. Pt fatigues rapidly and requires physical assistance to perform all functional mobility at this time due to general weakness. Pt is difficult to understand due to dysarthria, but seems to be well oriented to situation. Pt will benefit from aggressive mobilization and PT POC to improve mobility quality and reduce falls risk. PT recommends SNF placement at the time of discharge.    Follow Up Recommendations SNF    Equipment Recommendations  Wheelchair (measurements PT);Wheelchair cushion (measurements PT);Hospital bed (if D/C home today)    Recommendations for Other Services       Precautions / Restrictions Precautions Precautions: Fall;Other (comment) Precaution Comments: monitor vitals Restrictions Weight Bearing Restrictions: No      Mobility  Bed Mobility Overal bed mobility: Needs Assistance Bed Mobility: Supine to Sit;Sit to Supine     Supine to sit: Max assist;HOB elevated Sit to supine: Max assist;HOB elevated        Transfers Overall transfer level: Needs assistance Equipment used: 1 person hand held assist Transfers: Sit to/from Stand Sit to Stand: Mod assist            Ambulation/Gait Ambulation/Gait assistance:  (pt declines attempts at ambulation due to fatigue)              Stairs            Wheelchair Mobility    Modified Rankin (Stroke Patients Only)        Balance Overall balance assessment: Needs assistance Sitting-balance support: Feet supported;Single extremity supported Sitting balance-Leahy Scale: Poor Sitting balance - Comments: reliant on UE support of bed   Standing balance support: Bilateral upper extremity supported Standing balance-Leahy Scale: Poor Standing balance comment: modA with UE support of PT                             Pertinent Vitals/Pain Pain Assessment: Faces Faces Pain Scale: Hurts little more Pain Location: feet Pain Descriptors / Indicators: Sore Pain Intervention(s): Monitored during session    Home Living Family/patient expects to be discharged to:: Private residence Living Arrangements: Other relatives (sister) Available Help at Discharge: Family;Available 24 hours/day Type of Home: House Home Access: Level entry     Home Layout: One level Home Equipment: Cane - single point Additional Comments: history will need to be confirmed with family as pt admitted with AMS and history is different from that reported at most recent admission in 2020    Prior Function Level of Independence: Independent with assistive device(s)         Comments: pt reports ambulating with use of cane     Hand Dominance   Dominant Hand: Right    Extremity/Trunk Assessment   Upper Extremity Assessment Upper Extremity Assessment: Generalized weakness    Lower Extremity Assessment Lower Extremity Assessment: Generalized weakness    Cervical / Trunk Assessment Cervical / Trunk Assessment: Kyphotic  Communication   Communication:  (dysarthric)  Cognition Arousal/Alertness: Awake/alert  Behavior During Therapy: WFL for tasks assessed/performed Overall Cognitive Status: No family/caregiver present to determine baseline cognitive functioning                                 General Comments: pt follows commands with increased time and is oriented to person, place, situation. Pt does  demonstrate fair awareness of deficits at this time.      General Comments General comments (skin integrity, edema, etc.): pt on 5L [place], tachy into low 100s with SpO2 stable in mid to low 90s. RR does increase into low 40s with activity    Exercises     Assessment/Plan    PT Assessment Patient needs continued PT services  PT Problem List Decreased strength;Decreased balance;Decreased activity tolerance;Decreased mobility;Decreased cognition;Decreased knowledge of use of DME;Decreased knowledge of precautions;Decreased safety awareness;Cardiopulmonary status limiting activity       PT Treatment Interventions DME instruction;Gait training;Functional mobility training;Therapeutic activities;Therapeutic exercise;Balance training;Neuromuscular re-education;Patient/family education    PT Goals (Current goals can be found in the Care Plan section)  Acute Rehab PT Goals Patient Stated Goal: to go to rehab and get stronger PT Goal Formulation: With patient Time For Goal Achievement: 06/28/20 Potential to Achieve Goals: Good    Frequency Min 2X/week   Barriers to discharge        Co-evaluation               AM-PAC PT "6 Clicks" Mobility  Outcome Measure Help needed turning from your back to your side while in a flat bed without using bedrails?: A Lot Help needed moving from lying on your back to sitting on the side of a flat bed without using bedrails?: A Lot Help needed moving to and from a bed to a chair (including a wheelchair)?: A Lot Help needed standing up from a chair using your arms (e.g., wheelchair or bedside chair)?: A Lot Help needed to walk in hospital room?: Total Help needed climbing 3-5 steps with a railing? : Total 6 Click Score: 10    End of Session Equipment Utilized During Treatment: Oxygen Activity Tolerance: Patient limited by fatigue Patient left: in bed;with call bell/phone within reach;with bed alarm set;with nursing/sitter in room Nurse  Communication: Mobility status PT Visit Diagnosis: Other abnormalities of gait and mobility (R26.89);Muscle weakness (generalized) (M62.81)    Time: 2992-4268 PT Time Calculation (min) (ACUTE ONLY): 16 min   Charges:   PT Evaluation $PT Eval Moderate Complexity: 1 Mod          Zenaida Niece, PT, DPT Acute Rehabilitation Pager: [phone]   Zenaida Niece 06/14/2020, 5:12 PM

## 2020-06-14 NOTE — Progress Notes (Signed)
PHARMACY - PHYSICIAN COMMUNICATION CRITICAL VALUE ALERT - BLOOD CULTURE IDENTIFICATION (BCID)  [name] [name]. is an 69 y.o. male who presented to [facility] on 05/25/2020 with a chief complaint of stemi.   Assessment: Patient with concern for sepsis overnight, started on aztreonam due to allergies then changed to cefepime this afternoon after allergies clarified. Micro lab called for positive cultures of 1/4 bottles growing coag negative staph, no resistance detected. Will continue current antibiotics for now.   Name of physician (or Provider) ContactedHayes Ludwig MD  Current antibiotics: cefepime  Changes to prescribed antibiotics recommended:  Patient is on recommended antibiotics - No changes needed  Results for orders placed or performed during the hospital encounter of 06/11/2020  Blood Culture ID Panel (Reflexed) (Collected: 05/27/2020  1:55 PM)  Result Value Ref Range   Enterococcus faecalis NOT DETECTED NOT DETECTED   Enterococcus Faecium NOT DETECTED NOT DETECTED   Listeria monocytogenes NOT DETECTED NOT DETECTED   Staphylococcus species DETECTED (A) NOT DETECTED   Staphylococcus aureus (BCID) NOT DETECTED NOT DETECTED   Staphylococcus epidermidis NOT DETECTED NOT DETECTED   Staphylococcus lugdunensis NOT DETECTED NOT DETECTED   Streptococcus species NOT DETECTED NOT DETECTED   Streptococcus agalactiae NOT DETECTED NOT DETECTED   Streptococcus pneumoniae NOT DETECTED NOT DETECTED   Streptococcus pyogenes NOT DETECTED NOT DETECTED   A.calcoaceticus-baumannii NOT DETECTED NOT DETECTED   Bacteroides fragilis NOT DETECTED NOT DETECTED   Enterobacterales NOT DETECTED NOT DETECTED   Enterobacter cloacae complex NOT DETECTED NOT DETECTED   Escherichia coli NOT DETECTED NOT DETECTED   Klebsiella aerogenes NOT DETECTED NOT DETECTED   Klebsiella oxytoca NOT DETECTED NOT DETECTED   Klebsiella pneumoniae NOT DETECTED NOT DETECTED   Proteus species NOT DETECTED NOT DETECTED    Salmonella species NOT DETECTED NOT DETECTED   Serratia marcescens NOT DETECTED NOT DETECTED   Haemophilus influenzae NOT DETECTED NOT DETECTED   Neisseria meningitidis NOT DETECTED NOT DETECTED   Pseudomonas aeruginosa NOT DETECTED NOT DETECTED   Stenotrophomonas maltophilia NOT DETECTED NOT DETECTED   Candida albicans NOT DETECTED NOT DETECTED   Candida auris NOT DETECTED NOT DETECTED   Candida glabrata NOT DETECTED NOT DETECTED   Candida krusei NOT DETECTED NOT DETECTED   Candida parapsilosis NOT DETECTED NOT DETECTED   Candida tropicalis NOT DETECTED NOT DETECTED   Cryptococcus neoformans/gattii NOT DETECTED NOT DETECTED    Erin Hearing PharmD., BCPS Clinical Pharmacist 06/14/2020 3:59 PM

## 2020-06-15 ENCOUNTER — Inpatient Hospital Stay (HOSPITAL_COMMUNITY): Payer: Medicare HMO

## 2020-06-15 DIAGNOSIS — I5022 Chronic systolic (congestive) heart failure: Secondary | ICD-10-CM

## 2020-06-15 DIAGNOSIS — R0603 Acute respiratory distress: Secondary | ICD-10-CM

## 2020-06-15 LAB — URINE CULTURE: Culture: NO GROWTH

## 2020-06-15 LAB — IRON AND TIBC
Iron: 25 ug/dL — ABNORMAL LOW (ref 45–182)
Saturation Ratios: 10 % — ABNORMAL LOW (ref 17.9–39.5)
TIBC: 246 ug/dL — ABNORMAL LOW (ref 250–450)
UIBC: 221 ug/dL

## 2020-06-15 LAB — HEPATIC FUNCTION PANEL
ALT: 440 U/L — ABNORMAL HIGH (ref 0–44)
AST: 1202 U/L — ABNORMAL HIGH (ref 15–41)
Albumin: 2.3 g/dL — ABNORMAL LOW (ref 3.5–5.0)
Alkaline Phosphatase: 90 U/L (ref 38–126)
Bilirubin, Direct: 3.5 mg/dL — ABNORMAL HIGH (ref 0.0–0.2)
Indirect Bilirubin: 2.3 mg/dL — ABNORMAL HIGH (ref 0.3–0.9)
Total Bilirubin: 5.8 mg/dL — ABNORMAL HIGH (ref 0.3–1.2)
Total Protein: 6.1 g/dL — ABNORMAL LOW (ref 6.5–8.1)

## 2020-06-15 LAB — RENAL FUNCTION PANEL
Albumin: 2.3 g/dL — ABNORMAL LOW (ref 3.5–5.0)
Anion gap: 18 — ABNORMAL HIGH (ref 5–15)
BUN: 82 mg/dL — ABNORMAL HIGH (ref 8–23)
CO2: 12 mmol/L — ABNORMAL LOW (ref 22–32)
Calcium: 9.4 mg/dL (ref 8.9–10.3)
Chloride: 103 mmol/L (ref 98–111)
Creatinine, Ser: 3.78 mg/dL — ABNORMAL HIGH (ref 0.61–1.24)
GFR, Estimated: 17 mL/min — ABNORMAL LOW (ref >60)
Glucose, Bld: 88 mg/dL (ref 70–99)
Phosphorus: 6.8 mg/dL — ABNORMAL HIGH (ref 2.5–4.6)
Potassium: 6.2 mmol/L — ABNORMAL HIGH (ref 3.5–5.1)
Sodium: 133 mmol/L — ABNORMAL LOW (ref 135–145)

## 2020-06-15 LAB — GLUCOSE, CAPILLARY
Glucose-Capillary: 58 mg/dL — ABNORMAL LOW (ref 70–99)
Glucose-Capillary: 131 mg/dL — ABNORMAL HIGH (ref 70–99)
Glucose-Capillary: 120 mg/dL — ABNORMAL HIGH (ref 70–99)
Glucose-Capillary: 120 mg/dL — ABNORMAL HIGH (ref 70–99)
Glucose-Capillary: 127 mg/dL — ABNORMAL HIGH (ref 70–99)

## 2020-06-15 LAB — BLOOD GAS, ARTERIAL
Acid-base deficit: 9.5 mmol/L — ABNORMAL HIGH (ref 0.0–2.0)
Bicarbonate: 14.6 mmol/L — ABNORMAL LOW (ref 20.0–28.0)
Drawn by: 137461
FIO2: 40
O2 Saturation: 95.4 %
Patient temperature: 35.9
pCO2 arterial: 24.2 mmHg — ABNORMAL LOW (ref 32.0–48.0)
pH, Arterial: 7.392 (ref 7.350–7.450)
pO2, Arterial: 79.6 mmHg — ABNORMAL LOW (ref 83.0–108.0)

## 2020-06-15 LAB — CBC
HCT: 38.7 % — ABNORMAL LOW (ref 39.0–52.0)
Hemoglobin: 11.5 g/dL — ABNORMAL LOW (ref 13.0–17.0)
MCH: 26.4 pg (ref 26.0–34.0)
MCHC: 29.7 g/dL — ABNORMAL LOW (ref 30.0–36.0)
MCV: 88.8 fL (ref 80.0–100.0)
Platelets: 133 10*3/uL — ABNORMAL LOW (ref 150–400)
RBC: 4.36 MIL/uL (ref 4.22–5.81)
RDW: 20.3 % — ABNORMAL HIGH (ref 11.5–15.5)
WBC: 20 10*3/uL — ABNORMAL HIGH (ref 4.0–10.5)
nRBC: 1.6 % — ABNORMAL HIGH (ref 0.0–0.2)

## 2020-06-15 LAB — VITAMIN B12: Vitamin B-12: 5093 pg/mL — ABNORMAL HIGH (ref 180–914)

## 2020-06-15 LAB — MAGNESIUM: Magnesium: 2 mg/dL (ref 1.7–2.4)

## 2020-06-15 LAB — LACTIC ACID, PLASMA
Lactic Acid, Venous: 7.7 mmol/L (ref 0.5–1.9)
Lactic Acid, Venous: 3.9 mmol/L (ref 0.5–1.9)
Lactic Acid, Venous: 3 mmol/L (ref 0.5–1.9)

## 2020-06-15 LAB — FOLATE: Folate: 17.6 ng/mL (ref >5.9)

## 2020-06-15 LAB — PROTIME-INR
INR: 8.4 (ref 0.8–1.2)
Prothrombin Time: 67.3 seconds — ABNORMAL HIGH (ref 11.4–15.2)

## 2020-06-15 LAB — AMMONIA: Ammonia: 27 umol/L (ref 9–35)

## 2020-06-15 LAB — PROCALCITONIN: Procalcitonin: 7.73 ng/mL

## 2020-06-15 LAB — FERRITIN: Ferritin: 323 ng/mL (ref 24–336)

## 2020-06-15 MED ORDER — BUDESONIDE 0.25 MG/2ML IN SUSP
0.2500 mg | Freq: Two times a day (BID) | RESPIRATORY_TRACT | Status: DC
  Administered 2020-06-15 – 2020-06-16 (×3): 0.25 mg via RESPIRATORY_TRACT
  Filled 2020-06-15 (×3): qty 2

## 2020-06-15 MED ORDER — SODIUM BICARBONATE 8.4 % IV SOLN
INTRAVENOUS | Status: DC
  Filled 2020-06-15 (×3): qty 850

## 2020-06-15 MED ORDER — SODIUM BICARBONATE 8.4 % IV SOLN: INTRAVENOUS | Status: DC

## 2020-06-15 MED ORDER — SODIUM CHLORIDE 0.9 % IV BOLUS
250.0000 mL | INTRAVENOUS | Status: AC
  Administered 2020-06-15: 16:00:00 250 mL via INTRAVENOUS

## 2020-06-15 MED ORDER — MORPHINE SULFATE (PF) 2 MG/ML IV SOLN
2.0000 mg | INTRAVENOUS | Status: DC | PRN
  Administered 2020-06-15 – 2020-06-16 (×4): 2 mg via INTRAVENOUS
  Filled 2020-06-15 (×4): qty 1

## 2020-06-15 MED ORDER — DEXTROSE 50 % IV SOLN
INTRAVENOUS | Status: AC
  Administered 2020-06-15: 04:00:00 50 mL
  Filled 2020-06-15: qty 50

## 2020-06-15 MED ORDER — IPRATROPIUM-ALBUTEROL 0.5-2.5 (3) MG/3ML IN SOLN
3.0000 mL | Freq: Four times a day (QID) | RESPIRATORY_TRACT | Status: DC
  Administered 2020-06-15 (×4): 3 mL via RESPIRATORY_TRACT
  Filled 2020-06-15 (×4): qty 3

## 2020-06-15 MED ORDER — METHYLPREDNISOLONE SODIUM SUCC 40 MG IJ SOLR
40.0000 mg | Freq: Two times a day (BID) | INTRAMUSCULAR | Status: DC
  Administered 2020-06-15 (×2): 40 mg via INTRAVENOUS
  Filled 2020-06-15 (×2): qty 1

## 2020-06-15 MED ORDER — PANTOPRAZOLE SODIUM 40 MG IV SOLR
40.0000 mg | INTRAVENOUS | Status: DC
  Administered 2020-06-15: 12:00:00 40 mg via INTRAVENOUS
  Filled 2020-06-15: qty 40

## 2020-06-15 MED ORDER — CHLORHEXIDINE GLUCONATE CLOTH 2 % EX PADS
6.0000 | MEDICATED_PAD | Freq: Every day | CUTANEOUS | Status: DC
  Administered 2020-06-15: 16:00:00 6 via TOPICAL

## 2020-06-15 MED ORDER — ALBUTEROL SULFATE (2.5 MG/3ML) 0.083% IN NEBU
2.5000 mg | INHALATION_SOLUTION | Freq: Four times a day (QID) | RESPIRATORY_TRACT | Status: DC | PRN
  Administered 2020-06-16: 05:00:00 2.5 mg via RESPIRATORY_TRACT
  Filled 2020-06-15: qty 3

## 2020-06-15 MED ORDER — CALCIUM GLUCONATE-NACL 1-0.675 GM/50ML-% IV SOLN
1.0000 g | Freq: Once | INTRAVENOUS | Status: AC
  Administered 2020-06-15: 11:00:00 1000 mg via INTRAVENOUS
  Filled 2020-06-15: qty 50

## 2020-06-15 NOTE — Plan of Care (Signed)
   Critical INR level  of 8.4, paged  DR Clearence Ped to notify.  Patient resting in bed, BP 113/103, HR 98, 93% on 5l [place]

## 2020-06-15 NOTE — Evaluation (Signed)
Clinical/Bedside Swallow Evaluation Patient Details  Name: [name] [name]. MRN: [id number] Date of Birth: 1951-02-10  Today's Date: 06/15/2020 Time: SLP Start Time (ACUTE ONLY): 5361 SLP Stop Time (ACUTE ONLY): 0835 SLP Time Calculation (min) (ACUTE ONLY): 16 min  Past Medical History:  Past Medical History:  Diagnosis Date  . AICD (automatic cardioverter/defibrillator) present    reports no shocks  - st Jude  . Ambulates with cane   . CAD (coronary artery disease)    a. 04/2013 Cath: LM 10, LAD 40p, 26m, D1 50p, LCX 60p, 41m/100m, OM1 50, OM2 100 L-L collats, RCA 100p;  b. CABG x 5: LIMA->LAD, VG->D2, VG->RI->OM2, VG->PDA.  . Cataracts, bilateral   . Chronic systolic CHF (congestive heart failure) ([place])    a. 04/2013 Echo: EF 15-20%, diff HK, sev HK of inf and ant myocardium, dilated LA,  b. EF 20-25% with restrictive filling pattern, mod RV dysfx, mild MR (10/10/2013);  c.  Echo 12/15: EF 25-30%  . COPD (chronic obstructive pulmonary disease) ([place])   . Diabetes mellitus without complication ([place])    unsure what type but was dx as an adult, takes no meds, check his cbg at home 2 times a week   . Emphysema   . GERD (gastroesophageal reflux disease)   . Gout   . Hyperlipidemia   . Hypertension   . Ischemic cardiomyopathy   . lung ca   . PVD (peripheral vascular disease) ([place])    a. (10/2013) ABIs: RIGHT 0.63, Waveforms: monophasic;  LEFT 0.78, Waveforms: monophasic  . Tobacco abuse    30+ pack-year history  . Transaminitis    a. 04/2013 Abd U/S and CT unremarkable, hepatitis panel neg -->felt to be 2/2 acute R heart failure.   Past Surgical History:  Past Surgical History:  Procedure Laterality Date  . APPENDECTOMY  as a child  . BIOPSY  11/16/2019   Procedure: BIOPSY;  Surgeon: Rush Landmark Telford Nab., MD;  Location: [place];  Service: Gastroenterology;;  . CARDIAC CATHETERIZATION  04/08/2015   Procedure: Left Heart Cath and Cors/Grafts Angiography;  Surgeon: Peter M  Martinique, MD;  Location: [place] [facility];  Service: Cardiovascular;;  . COLONOSCOPY    . COLONOSCOPY WITH PROPOFOL N/A 11/16/2019   Procedure: COLONOSCOPY WITH PROPOFOL;  Surgeon: Rush Landmark Telford Nab., MD;  Location: [place];  Service: Gastroenterology;  Laterality: N/A;  . CORONARY ARTERY BYPASS GRAFT N/A 05/05/2013   Procedure: CORONARY ARTERY BYPASS GRAFTING (CABG) TIMES FIVE USING LEFT INTERNAL MAMMARY ARTERY AND RIGHT AND LEFT SAPHENOUS LEG VEIN HARVESTED ENDOSCOPICALLY;  Surgeon: Melrose Nakayama, MD;  Location: [place];  Service: Open Heart Surgery;  Laterality: N/A;  . ESOPHAGOGASTRODUODENOSCOPY (EGD) WITH PROPOFOL N/A 11/16/2019   Procedure: ESOPHAGOGASTRODUODENOSCOPY (EGD) WITH PROPOFOL;  Surgeon: Rush Landmark Telford Nab., MD;  Location: [place];  Service: Gastroenterology;  Laterality: N/A;  . HEMOSTASIS CLIP PLACEMENT  11/16/2019   Procedure: HEMOSTASIS CLIP PLACEMENT;  Surgeon: Irving Copas., MD;  Location: [place];  Service: Gastroenterology;;  . IMPLANTABLE CARDIOVERTER DEFIBRILLATOR IMPLANT N/A 07/06/2014   Procedure: St Jude IMPLANTABLE CARDIOVERTER DEFIBRILLATOR IMPLANT;  Surgeon: Deboraha Sprang, MD;  Location: [facility] C[facility] LAB;  Service: Cardiovascular;  Laterality: N/A;  . LEFT AND RIGHT HEART CATHETERIZATION WITH CORONARY ANGIOGRAM N/A 04/28/2013   Procedure: LEFT AND RIGHT HEART CATHETERIZATION WITH CORONARY ANGIOGRAM;  Surgeon: Burnell Blanks, MD;  Location: [facility] CATH LAB;  Service: Cardiovascular;  Laterality: N/A;  . MULTIPLE EXTRACTIONS WITH ALVEOLOPLASTY N/A 05/03/2013   Procedure: Extraction of tooth #s  [phone] with alveoloplast and maxillary left osseous tuberosity reduction;  Surgeon: Lenn Cal, DDS;  Location: [place];  Service: Oral Surgery;  Laterality: N/A;  . POLYPECTOMY  11/16/2019   Procedure: POLYPECTOMY;  Surgeon: Rush Landmark Telford Nab., MD;  Location: [place];  Service: Gastroenterology;;  . TEE WITHOUT  CARDIOVERSION N/A 09/01/2019   Procedure: TRANSESOPHAGEAL ECHOCARDIOGRAM (TEE);  Surgeon: Larey Dresser, MD;  Location: [facility] ENDOSCOPY;  Service: Cardiovascular;  Laterality: N/A;  . TOTAL HIP ARTHROPLASTY Right 11/30/2018   Procedure: TOTAL HIP ARTHROPLASTY ANTERIOR APPROACH;  Surgeon: Rod Can, MD;  Location: [facility];  Service: Orthopedics;  Laterality: Right;  Marland Kitchen VIDEO BRONCHOSCOPY WITH ENDOBRONCHIAL ULTRASOUND N/A 04/20/2019   Procedure: VIDEO BRONCHOSCOPY WITH ENDOBRONCHIAL ULTRASOUND;  Surgeon: Margaretha Seeds, MD;  Location: [facility] OR;  Service: Thoracic;  Laterality: N/A;   HPI:  69 y.o. male admitted with severe sepsis from unknown source. Pt with recent cardiac cath showing diffuse CAD without need for intervention. Pt found to be hypothermic and dehydrated. Initial CXR without acute disease but repeat 12/25 revealed marked progression of diffuse airspace disease in the R lung concerning for diffuse infection. PMH includes: CAD s/p CABG, AICD, CHF, COPD, lung cancer (in remission), GERD   Assessment / Plan / Recommendation Clinical Impression  Pt does not appear to be appropriate for PO diet in the setting of significant deconditioning, reduced respiratory status, weak cough, and altered mentation. He accepted minimal POs due to mentation and/or weakness regardless of bolud delivery method. He did consume an ice chip with imemdiate coughing that followed. RN describes current presentation as an ongoing decline, but that he sounded congested even on previous date after administering meds crushed in puree. Recommend that he remain NPO for today - MD also made aware. SLP will continue to follow. SLP Visit Diagnosis: Dysphagia, unspecified (R13.10)    Aspiration Risk  Moderate aspiration risk;Severe aspiration risk    Diet Recommendation NPO   Medication Administration: Via alternative means    Other  Recommendations Oral Care Recommendations: Oral care QID Other Recommendations: Have  oral suction available   Follow up Recommendations  (tba)      Frequency and Duration min 2x/week  2 weeks       Prognosis Prognosis for Safe Diet Advancement: Fair Barriers to Reach Goals: Severity of deficits      Swallow Study   General HPI: 69 y.o. male admitted with severe sepsis from unknown source. Pt with recent cardiac cath showing diffuse CAD without need for intervention. Pt found to be hypothermic and dehydrated. Initial CXR without acute disease but repeat 12/25 revealed marked progression of diffuse airspace disease in the R lung concerning for diffuse infection. PMH includes: CAD s/p CABG, AICD, CHF, COPD, lung cancer (in remission), GERD Type of Study: Bedside Swallow Evaluation Previous Swallow Assessment: none in chart Diet Prior to this Study: Regular;Thin liquids Temperature Spikes Noted: No Respiratory Status: Nasal cannula History of Recent Intubation: No Behavior/Cognition: Alert;Requires cueing Oral Cavity Assessment: Dry Oral Care Completed by SLP: No Oral Cavity - Dentition: Edentulous Self-Feeding Abilities: Total assist Patient Positioning: Upright in bed Baseline Vocal Quality: Low vocal intensity Volitional Cough: Weak    Oral/Motor/Sensory Function Overall Oral Motor/Sensory Function: Generalized oral weakness   Ice Chips Ice chips: Impaired Presentation: Spoon Pharyngeal Phase Impairments: Cough - Immediate   Thin Liquid Thin Liquid: Impaired Presentation: Cup;Self Fed;Spoon;Straw Oral Phase Impairments: Poor awareness of bolus    Nectar Thick Nectar Thick Liquid: Not tested   Honey Thick  Honey Thick Liquid: Not tested   Puree Puree: Not tested   Solid     Solid: Not tested      Osie Bond., M.A. [facility] Pager [phone] Office [phone]  06/15/2020,8:50 AM

## 2020-06-15 NOTE — Plan of Care (Signed)
  Patient very lethargic and  Lungs very wet. Has blood in his mouth and was suctioned by RT, mouth care performed. Blood sugar check was 58 and he received dextrose as per protocol. Rapid response RN called at bedside to help assess patient.  BP 132/00, HR 98,  spo2 sats 97% on 5L [place] Oncall hospitalist notified of patient's condition.

## 2020-06-15 NOTE — Significant Event (Signed)
Rapid Response Event Note   Reason for Call : Lethargy and respiratory distress   Initial Focused Assessment:  Nursing staff notified me regarding [name] [name] being more lethargic and wet congested breath sounds. Pt's CBG 58 and treated with D50 prior to my arrival. Recheck of CBG 125. Upon my arrival, [name] [name] is arousable to voice but requires frequent stimulation to stay awake. He inconsistently follows simple commands and localizes to noxious stimuli. He is tachypneic with RR 28-30 with some accessory muscle use and poor effort. BBS coarse rhonchi with old bloody secretions coming from his mouth. INR 8.4. Weak cough. +JVD. Pitting edema.  Skin is warm, dry and pink.  [name] [name] is more awake since D50.  Hr 102 ST with BBB, 132/99(110), RR 28 with sats 100% on 5L [place]  Interventions:  -Stat PCXR  Plan of Care:  -Aspiration precautions -Advanced Heart Failure team consult recommended -Notify primary MD for further orders     MD Notified: Per primary RN Call Time: 0419 Arrival Time: 0421 End Time: [place]  Madelynn Done, RN

## 2020-06-15 NOTE — Progress Notes (Signed)
PROGRESS NOTE    [name] [name].  WFU:[id number] DOB: [birth year] DOA: 05/23/2020 PCP: [name], PA-C    Brief Narrative:  69 year old male with a history of coronary artery disease, chronic systolic congestive heart failure, lung cancer in remission, was brought to the emergency room with complaints of chest pain and altered mental status.  He was found to have possible pneumonia and sepsis.  He was started on intravenous antibiotics.  Since admission, he had worsening renal function, persistent lactic acidosis, and worsening respiratory failure.   Assessment & Plan:   Active Problems:   COPD (chronic obstructive pulmonary disease) (HCC)   CHF, acute (HCC)   Coronary atherosclerosis of native coronary artery   S/P CABG x 5   Atrial fibrillation (HCC)   Atypical chest pain   Chronic congestive heart failure (HCC)   Dehydration   Altered mental status   Prolonged QT interval   Hyperkalemia   Hypothermia   Leukocytosis   Thrombocytopenia (HCC)   Metabolic acidosis   Elevated brain natriuretic peptide (BNP) level   Supratherapeutic INR   Serum total bilirubin elevated   Elevated liver enzymes   Hypoalbuminemia   Lactic acidosis   Toe abrasion, left, initial encounter   Acute kidney injury superimposed on CKD (HCC)   GERD (gastroesophageal reflux disease)   Severe sepsis (HCC)   Acute respiratory failure with hypoxia -Secondary to aspiration pneumonia -Increasing oxygen requirements overnight -Currently with increased work of breathing -Checking ABG -Metabolic acidosis may also be contributing to increased respiratory rate -We will try BiPAP for increased work of breathing  Aspiration pneumonia -Worsening infiltrates on right lung -Seen by speech therapy, currently n.p.o. -Continue IV antibiotics  Severe sepsis secondary to pneumonia -Patient has elevated WBC, hypothermia -Elevated lactic acid -He is on IV antibiotics -Only 1 set of blood cultures were sent  on admission which are positive for gram-positive cocci, likely coagulase-negative staph -We will repeat cultures today -Urine cultures negative -Continue IV hydration -Avoid large boluses of IV fluid in the setting of advanced systolic dysfunction  Acute kidney injury on chronic kidney disease stage IIIb -Baseline creatinine 1.4-1.8 -Creatinine has trended up to 3.78 -Suspect this is related to intravascular hypovolemia -Start on IV fluids and monitor urine output -Place Foley catheter for 24 to 48 hours to monitor urine output  -Hyperkalemia -Likely related to renal failure -He has been started on bicarb -We will give a dose of calcium gluconate -Patient is n.p.o. due to risk of aspiration, so cannot give Lokelma  Chronic systolic congestive heart failure -Ejection fraction of 20 to 25% -Holding diuretics in the setting of elevated lactic acid and worsening renal failure -Patient has 1-2+ pitting edema in lower extremities  Atrial fibrillation -Heart rate is currently stable -Eliquis is currently on hold in light of supratherapeutic INR  Elevated liver enzymes -Suspect this is secondary to hemodynamic instability and sepsis -Continue with IV hydration -Check right upper quadrant ultrasound  COPD -Continue on bronchodilators -Add inhaled/IV steroids in the setting of pneumonia -Continue pulmonary hygiene  Diabetes -Chronically on Farxiga which is currently on hold -He has been having episodes of hypoglycemia -Continue to monitor on sliding scale  Coronary artery disease status post CABG -Recently had cardiac cath [facility] for STEMI at that time which showed diffuse coronary artery disease -No intervention was performed at that time -Continue medical management as able  Metabolic encephalopathy -Likely related to sepsis/uremia -Continue to monitor  Goals of care -Discussed patient's chronic medical conditions, current status  and expected prognosis with  patient's sister who is his power of attorney -She understands that patient's overall condition appears to be worsening -She wishes to continue with current treatments, but does agree to DNR status. -I have recommended that if patient continues to decline despite aggressive measures, transitioning to comfort care would be reasonable -Patient sister is in agreement -We will continue treatment for another 24 to 48 hours and monitor for progression     DVT prophylaxis: SCDs Start: 05/28/2020 2050  Code Status: DNR Family Communication: Discussed at length with patient's sister, Delphine Lanae Boast who is POA Disposition Plan: Status is: Inpatient  Remains inpatient appropriate because:Inpatient level of care appropriate due to severity of illness   Dispo: The patient is from: Home              Anticipated d/c is to: TBD              Anticipated d/c date is: > 3 days              Patient currently is not medically stable to d/c.    Consultants:     Procedures:     Antimicrobials:   Cefepime   Subjective: Patient is somnolent, wakes up to voice, speech is difficult to comprehend  Objective: Vitals:   06/15/20 0600 06/15/20 0719 06/15/20 0734 06/15/20 0900  BP: 120/66  110/68 102/65  Pulse: (!) 102 (!) 103 (!) 102 (!) 103  Resp: (!) 25 (!) 30 (!) 26 (!) 37  Temp:   (!) 96.7 F (35.9 C) (!) 96.6 F (35.9 C)  TempSrc:   Rectal Axillary  SpO2: 100%  100%   Weight:      Height:        Intake/Output Summary (Last 24 hours) at 06/15/2020 8127 Last data filed at 06/14/2020 1703 Gross per 24 hour  Intake 463.58 ml  Output --  Net 463.58 ml   Filed Weights   06/06/2020 2100  Weight: 79.8 kg    Examination:  General exam: Sitting up in bed, increased respiratory effort Respiratory system: Bilateral rhonchi, increased respiratory effort Cardiovascular system: S1 & S2 heard, RRR. No JVD, murmurs, rubs, gallops or clicks.  Gastrointestinal system: Abdomen is  nondistended, soft and nontender. No organomegaly or masses felt. Normal bowel sounds heard. Central nervous system: Somnolent.  No focal neurological deficits. Extremities: Symmetric  Skin: No rashes, lesions or ulcers Psychiatry: somnolent     Data Reviewed: I have personally reviewed following labs and imaging studies  CBC: Recent Labs  Lab 05/28/2020 1134 06/14/20 0044 06/15/20 0050  WBC 15.8* 16.9* 20.0*  NEUTROABS 13.9*  --   --   HGB 11.6* 11.1* 11.5*  HCT 39.0 38.3* 38.7*  MCV 90.3 88.0 88.8  PLT 141* 170 517*   Basic Metabolic Panel: Recent Labs  Lab 06/04/2020 1134 06/14/20 0044 06/15/20 0050 06/15/20 0328  NA 130* 134* 133*  --   K 6.1* 5.1 6.2*  --   CL 98 101 103  --   CO2 13* 14* 12*  --   GLUCOSE 73 77 88  --   BUN 66* 67* 82*  --   CREATININE 2.58* 2.96* 3.78*  --   CALCIUM 9.9 10.3 9.4  --   MG  --  1.7  --  2.0  PHOS  --  6.0* 6.8*  --    GFR: Estimated Creatinine Clearance: 18.9 mL/min (A) (by C-G formula based on SCr of 3.78 mg/dL (H)). Liver Function Tests: Recent Labs  Lab 06/08/2020 1134 06/14/20 0044 06/15/20 0050 06/15/20 0328  AST 113* 160*  --  1,202*  ALT 54* 79*  --  440*  ALKPHOS 87 86  --  90  BILITOT 4.6* 4.9*  --  5.8*  PROT 6.7 6.4*  --  6.1*  ALBUMIN 2.6* 2.5* 2.3* 2.3*   No results for input(s): LIPASE, AMYLASE in the last 168 hours. Recent Labs  Lab 06/15/20 0603  AMMONIA 27   Coagulation Profile: Recent Labs  Lab 06/01/2020 1148 06/14/20 0044 06/15/20 0050  INR 5.5* 6.4* 8.4*   Cardiac Enzymes: No results for input(s): CKTOTAL, CKMB, CKMBINDEX, TROPONINI in the last 168 hours. BNP (last 3 results) No results for input(s): PROBNP in the last 8760 hours. HbA1C: Recent Labs    06/03/2020 2103  HGBA1C 5.2   CBG: Recent Labs  Lab 06/14/20 1156 06/14/20 1619 06/14/20 2109 06/15/20 0409 06/15/20 0754  GLUCAP 112* 142* 115* 58* 131*   Lipid Profile: No results for input(s): CHOL, HDL, LDLCALC, TRIG,  CHOLHDL, LDLDIRECT in the last 72 hours. Thyroid Function Tests: Recent Labs    06/14/20 0044  TSH 3.030   Anemia Panel: Recent Labs    06/15/20 0050  VITAMINB12 5,093*  FERRITIN 323  TIBC 246*  IRON 25*   Sepsis Labs: Recent Labs  Lab 06/14/2020 1523 06/09/2020 1634 05/26/2020 2319 06/14/20 0044 06/15/20 0050 06/15/20 0328  PROCALCITON  --  1.22  --  1.38  --  7.73  LATICACIDVEN 4.5*  --  7.6* 8.3* 7.7*  --     Recent Results (from the past 240 hour(s))  Resp Panel by RT-PCR (Flu A&B, Covid) Nasopharyngeal Swab     Status: None   Collection Time: 06/14/2020 11:13 AM   Specimen: Nasopharyngeal Swab; Nasopharyngeal(NP) swabs in vial transport medium  Result Value Ref Range Status   SARS Coronavirus 2 by RT PCR NEGATIVE NEGATIVE Final    Comment: (NOTE) SARS-CoV-2 target nucleic acids are NOT DETECTED.  The SARS-CoV-2 RNA is generally detectable in upper respiratory specimens during the acute phase of infection. The lowest concentration of SARS-CoV-2 viral copies this assay can detect is 138 copies/mL. A negative result does not preclude SARS-Cov-2 infection and should not be used as the sole basis for treatment or other patient management decisions. A negative result may occur with  improper specimen collection/handling, submission of specimen other than nasopharyngeal swab, presence of viral mutation(s) within the areas targeted by this assay, and inadequate number of viral copies(<138 copies/mL). A negative result must be combined with clinical observations, patient history, and epidemiological information. The expected result is Negative.  Fact Sheet for Patients:  EntrepreneurPulse.com.au  Fact Sheet for Healthcare Providers:  IncredibleEmployment.be  This test is no t yet approved or cleared by the [place] FDA and  has been authorized for detection and/or diagnosis of SARS-CoV-2 by FDA under an Emergency Use Authorization  (EUA). This EUA will remain  in effect (meaning this test can be used) for the duration of the COVID-19 declaration under Section 564(b)(1) of the Act, 21 U.S.C.section 360bbb-3(b)(1), unless the authorization is terminated  or revoked sooner.       Influenza A by PCR NEGATIVE NEGATIVE Final   Influenza B by PCR NEGATIVE NEGATIVE Final    Comment: (NOTE) The Xpert Xpress SARS-CoV-2/FLU/RSV plus assay is intended as an aid in the diagnosis of influenza from Nasopharyngeal swab specimens and should not be used as a sole basis for treatment. Nasal washings and aspirates are unacceptable for Xpert  Xpress SARS-CoV-2/FLU/RSV testing.  Fact Sheet for Patients: EntrepreneurPulse.com.au  Fact Sheet for Healthcare Providers: IncredibleEmployment.be  This test is not yet approved or cleared by the [place] FDA and has been authorized for detection and/or diagnosis of SARS-CoV-2 by FDA under an Emergency Use Authorization (EUA). This EUA will remain in effect (meaning this test can be used) for the duration of the COVID-19 declaration under Section 564(b)(1) of the Act, 21 U.S.C. section 360bbb-3(b)(1), unless the authorization is terminated or revoked.  Performed at [facility], [place] [address], [place], [place] [address]   Blood culture (routine x 2)     Status: None (Preliminary result)   Collection Time: 06/18/2020  1:10 PM   Specimen: BLOOD  Result Value Ref Range Status   Specimen Description BLOOD LEFT ANTECUBITAL  Final   Special Requests   Final    BOTTLES DRAWN AEROBIC AND ANAEROBIC Blood Culture adequate volume   Culture   Final    NO GROWTH 1 DAY Performed at [facility], [place] [address], [place], [place] [address]    Report Status PENDING  Incomplete  Blood culture (routine x 2)     Status: None (Preliminary result)   Collection Time: 05/26/2020  1:55 PM   Specimen: BLOOD RIGHT WRIST  Result Value Ref Range Status    Specimen Description BLOOD RIGHT WRIST  Final   Special Requests   Final    BOTTLES DRAWN AEROBIC AND ANAEROBIC Blood Culture adequate volume   Culture  Setup Time   Final    GRAM POSITIVE COCCI IN CLUSTERS ANAEROBIC BOTTLE ONLY Organism ID to follow Performed at [facility], [place] [address], [place], [place] [address]    Culture GRAM POSITIVE COCCI  Final   Report Status PENDING  Incomplete  Blood Culture ID Panel (Reflexed)     Status: Abnormal   Collection Time: 06/14/2020  1:55 PM  Result Value Ref Range Status   Enterococcus faecalis NOT DETECTED NOT DETECTED Final   Enterococcus Faecium NOT DETECTED NOT DETECTED Final   Listeria monocytogenes NOT DETECTED NOT DETECTED Final   Staphylococcus species DETECTED (A) NOT DETECTED Final    Comment: CRITICAL RESULT CALLED TO, READ BACK BY AND VERIFIED WITH: Tillman Sers PharmD 15:35 06/14/20 ([email or web address])    Staphylococcus aureus (BCID) NOT DETECTED NOT DETECTED Final   Staphylococcus epidermidis NOT DETECTED NOT DETECTED Final   Staphylococcus lugdunensis NOT DETECTED NOT DETECTED Final   Streptococcus species NOT DETECTED NOT DETECTED Final   Streptococcus agalactiae NOT DETECTED NOT DETECTED Final   Streptococcus pneumoniae NOT DETECTED NOT DETECTED Final   Streptococcus pyogenes NOT DETECTED NOT DETECTED Final   A.calcoaceticus-baumannii NOT DETECTED NOT DETECTED Final   Bacteroides fragilis NOT DETECTED NOT DETECTED Final   Enterobacterales NOT DETECTED NOT DETECTED Final   Enterobacter cloacae complex NOT DETECTED NOT DETECTED Final   Escherichia coli NOT DETECTED NOT DETECTED Final   Klebsiella aerogenes NOT DETECTED NOT DETECTED Final   Klebsiella oxytoca NOT DETECTED NOT DETECTED Final   Klebsiella pneumoniae NOT DETECTED NOT DETECTED Final   Proteus species NOT DETECTED NOT DETECTED Final   Salmonella species NOT DETECTED NOT DETECTED Final   Serratia marcescens NOT DETECTED NOT DETECTED Final   Haemophilus influenzae NOT  DETECTED NOT DETECTED Final   Neisseria meningitidis NOT DETECTED NOT DETECTED Final   Pseudomonas aeruginosa NOT DETECTED NOT DETECTED Final   Stenotrophomonas maltophilia NOT DETECTED NOT DETECTED Final   Candida albicans NOT DETECTED NOT DETECTED Final  Candida auris NOT DETECTED NOT DETECTED Final   Candida glabrata NOT DETECTED NOT DETECTED Final   Candida krusei NOT DETECTED NOT DETECTED Final   Candida parapsilosis NOT DETECTED NOT DETECTED Final   Candida tropicalis NOT DETECTED NOT DETECTED Final   Cryptococcus neoformans/gattii NOT DETECTED NOT DETECTED Final    Comment: Performed at [facility], [place] [address], [place], [place] [address]  Urine culture     Status: None   Collection Time: 06/05/2020  2:25 PM   Specimen: In/Out Cath Urine  Result Value Ref Range Status   Specimen Description IN/OUT CATH URINE  Final   Special Requests NONE  Final   Culture   Final    NO GROWTH Performed at [facility], [place] [address], [place], [place] [address]    Report Status 06/15/2020 FINAL  Final         Radiology Studies: DG Chest Port 1 View  Result Date: 06/15/2020 CLINICAL DATA:  Acute respiratory distress. EXAM: PORTABLE CHEST 1 VIEW COMPARISON:  06/01/2020 FINDINGS: Marked interval progression of diffuse airspace disease in the right lung. Consolidative opacity/scarring in the left parahilar lung is similar to prior. The cardio pericardial silhouette is enlarged. The visualized bony structures of the thorax show no acute abnormality. IMPRESSION: Marked progression of diffuse airspace disease in the right lung compatible with asymmetric edema or diffuse infection. Electronically Signed   By: Misty Stanley M.D.   On: 06/15/2020 05:42   DG Chest Port 1 View  Result Date: 06/15/2020 CLINICAL DATA:  Left sided chest pain EXAM: PORTABLE CHEST 1 VIEW COMPARISON:  01/05/2020 FINDINGS: Band like area of consolidation in the left upper lung extending from the periphery to  the suprahilar region. Surgical clips in the left hilum. Right lung is clear. No pleural effusion or pneumothorax. Stable cardiomediastinal silhouette. Prior CABG. IMPRESSION: Band like area of consolidation in the left upper lung extending from the periphery to the suprahilar region. Overall appearance is similar to the prior examination of 01/05/2020. This likely reflects post radiation changes. No acute cardiopulmonary disease. Electronically Signed   By: Kathreen Devoid   On: 06/08/2020 11:52        Scheduled Meds: . atorvastatin  20 mg Oral Daily  . feeding supplement (GLUCERNA SHAKE)  237 mL Oral TID BM  . insulin aspart  0-5 Units Subcutaneous QHS  . insulin aspart  0-9 Units Subcutaneous TID WC  . ipratropium-albuterol  3 mL Nebulization QID  . pantoprazole  40 mg Oral Daily  . sucralfate  1 g Oral TID WC & HS   Continuous Infusions: . ceFEPime (MAXIPIME) IV 2 g (06/14/20 1703)  . dextrose 5 % 1,000 mL with sodium bicarbonate 150 mEq infusion       LOS: 2 days    Critical care procedure note Authorized and performed by: Kathie Dike Total critical care time: Approximately 35 minutes Due to high probability of clinically significant, life-threatening deterioration, the patient required my highest level of preparedness to intervene emergently and I personally spent this critical care time directly and personally managing the patient.  The critical care time included obtaining a history, examining the patient, pulse oximetry, ordering and review of studies, arranging urgent treatment with development of a management plan, evaluation of patient's response to treatment, frequent reassessment, discussions with other providers.  Critical care time was performed to assess and manage the high probability of imminent, life-threatening deterioration that could result in multiorgan failure.  It was exclusive of  separate billable procedures and treating other patients and teaching time.  Please  see MDM section and the rest of the of note for further information on patient assessment and treatment     Kathie Dike, MD [facility]   If 7PM-7AM, please contact night-coverage [email or web address]  06/15/2020, 9:23 AM

## 2020-06-15 NOTE — Progress Notes (Signed)
Post first 250 bolus

## 2020-06-15 NOTE — Progress Notes (Signed)
Called patient's sister who is power of attorney for update.  I informed her that despite receiving IV fluids and antibiotics, his overall condition has not had any significant change today.  Still remains short of breath on BiPAP.  She is agreeable to continue current treatments and also agrees that if respiratory status continues to decline with increased work of breathing, it would be reasonable to give the patient morphine for respiratory distress to help mitigate any degree of suffering.  [employer]

## 2020-06-15 NOTE — Plan of Care (Signed)

## 2020-06-16 DIAGNOSIS — J9601 Acute respiratory failure with hypoxia: Secondary | ICD-10-CM | POA: Diagnosis present

## 2020-06-16 DIAGNOSIS — G9341 Metabolic encephalopathy: Secondary | ICD-10-CM | POA: Diagnosis present

## 2020-06-16 DIAGNOSIS — Z66 Do not resuscitate: Secondary | ICD-10-CM | POA: Diagnosis present

## 2020-06-16 DIAGNOSIS — E1129 Type 2 diabetes mellitus with other diabetic kidney complication: Secondary | ICD-10-CM | POA: Diagnosis present

## 2020-06-16 LAB — COMPREHENSIVE METABOLIC PANEL
ALT: 631 U/L — ABNORMAL HIGH (ref 0–44)
AST: 1635 U/L — ABNORMAL HIGH (ref 15–41)
Albumin: 2 g/dL — ABNORMAL LOW (ref 3.5–5.0)
Alkaline Phosphatase: 94 U/L (ref 38–126)
Anion gap: 19 — ABNORMAL HIGH (ref 5–15)
BUN: 99 mg/dL — ABNORMAL HIGH (ref 8–23)
CO2: 13 mmol/L — ABNORMAL LOW (ref 22–32)
Calcium: 8.6 mg/dL — ABNORMAL LOW (ref 8.9–10.3)
Chloride: 102 mmol/L (ref 98–111)
Creatinine, Ser: 4.39 mg/dL — ABNORMAL HIGH (ref 0.61–1.24)
GFR, Estimated: 14 mL/min — ABNORMAL LOW (ref >60)
Glucose, Bld: 122 mg/dL — ABNORMAL HIGH (ref 70–99)
Potassium: 6.2 mmol/L — ABNORMAL HIGH (ref 3.5–5.1)
Sodium: 134 mmol/L — ABNORMAL LOW (ref 135–145)
Total Bilirubin: 6.6 mg/dL — ABNORMAL HIGH (ref 0.3–1.2)
Total Protein: 5.3 g/dL — ABNORMAL LOW (ref 6.5–8.1)

## 2020-06-16 LAB — CULTURE, BLOOD (ROUTINE X 2): Special Requests: ADEQUATE

## 2020-06-16 LAB — CBC
HCT: 30.9 % — ABNORMAL LOW (ref 39.0–52.0)
Hemoglobin: 9.6 g/dL — ABNORMAL LOW (ref 13.0–17.0)
MCH: 26.7 pg (ref 26.0–34.0)
MCHC: 31.1 g/dL (ref 30.0–36.0)
MCV: 85.8 fL (ref 80.0–100.0)
Platelets: 136 10*3/uL — ABNORMAL LOW (ref 150–400)
RBC: 3.6 MIL/uL — ABNORMAL LOW (ref 4.22–5.81)
RDW: 20.6 % — ABNORMAL HIGH (ref 11.5–15.5)
WBC: 19.8 10*3/uL — ABNORMAL HIGH (ref 4.0–10.5)
nRBC: 1.2 % — ABNORMAL HIGH (ref 0.0–0.2)

## 2020-06-16 LAB — GLUCOSE, CAPILLARY: Glucose-Capillary: 119 mg/dL — ABNORMAL HIGH (ref 70–99)

## 2020-06-16 MED ORDER — MORPHINE SULFATE (PF) 2 MG/ML IV SOLN
2.0000 mg | INTRAVENOUS | Status: DC | PRN
  Filled 2020-06-16: qty 1

## 2020-06-16 MED ORDER — IPRATROPIUM-ALBUTEROL 0.5-2.5 (3) MG/3ML IN SOLN
3.0000 mL | Freq: Three times a day (TID) | RESPIRATORY_TRACT | Status: DC
  Administered 2020-06-16: 07:00:00 3 mL via RESPIRATORY_TRACT
  Filled 2020-06-16: qty 3

## 2020-06-17 LAB — GLUCOSE, CAPILLARY: Glucose-Capillary: 125 mg/dL — ABNORMAL HIGH (ref 70–99)

## 2020-06-17 LAB — LEGIONELLA PNEUMOPHILA SEROGP 1 UR AG: L. pneumophila Serogp 1 Ur Ag: NEGATIVE

## 2020-06-18 LAB — CULTURE, BLOOD (ROUTINE X 2)
Culture: NO GROWTH
Special Requests: ADEQUATE

## 2020-06-20 LAB — CULTURE, BLOOD (ROUTINE X 2)
Culture: NO GROWTH
Culture: NO GROWTH
Special Requests: ADEQUATE

## 2020-06-22 NOTE — Death Summary Note (Signed)
DEATH SUMMARY   Patient Details  Name: [name] [name]. MRN: [id number] DOB: 1951/01/02  Admission/Discharge Information   Admit Date:  06-29-20  Date of Death: Date of Death: 02-Jul-2020  Time of Death: Time of Death: September 15, 1018  Length of Stay: 3  Referring Physician: [name], PA-C   Reason(s) for Hospitalization  Chest pain and altered mental status  Diagnoses  Preliminary cause of death: severe sepsis  Secondary Diagnoses (including complications and co-morbidities):  Active Problems:   COPD (chronic obstructive pulmonary disease) (HCC)   CHF, acute (HCC)   Coronary atherosclerosis of native coronary artery   S/P CABG x 5   Chronic systolic heart failure (HCC)   Atrial fibrillation (HCC)   Atypical chest pain   Chronic congestive heart failure (HCC)   Aspiration pneumonia (HCC)   Dehydration   Altered mental status   Prolonged QT interval   Chronic kidney disease, stage III (moderate) (HCC)   Hyperkalemia   Hypothermia   Leukocytosis   Thrombocytopenia (HCC)   Metabolic acidosis   Elevated brain natriuretic peptide (BNP) level   Supratherapeutic INR   Serum total bilirubin elevated   Elevated liver enzymes   Hypoalbuminemia   Lactic acidosis   Toe abrasion, left, initial encounter   Acute kidney injury superimposed on CKD ([place])   GERD (gastroesophageal reflux disease)   Severe sepsis (HCC)   DNR (do not resuscitate)   Acute respiratory failure with hypoxia ([place])   Acute metabolic encephalopathy   DM (diabetes mellitus), type 2 with renal complications [facility])   Brief Hospital Course (including significant findings, care, treatment, and services provided and events leading to death)  [name] [name]. is a 70 y.o. year old male who 70 year old male with multiple medical problems including chronic systolic congestive heart failure with ejection fraction of 20 to 25%, chronic kidney disease stage IIIb, previous history of lung cancer in remission, diabetes,  hypertension, was initially brought to the emergency room with complaints of chest pain and was found to have confusion/altered mental status.  He was found to have possible aspiration pneumonia and associated sepsis.  The patient was started on intravenous antibiotics and IV fluids.  After admission, it was noted that his respiratory status continued to decline.  He was noted to be significant rhonchi on chest exam and have increased work of breathing.  He was placed on BiPAP.  He was continued on antibiotics and IV fluids.  Despite aggressive measures, he had worsening in renal function with rising creatinine, associated hypotension and worsening liver enzymes.  Goals of care discussion was had with patient's sister who is reportedly patient's power of attorney.  She confirmed that patient would not want heroic measures such as CPR or intubation.  A trial of aggressive treatment for 24 hours was contacted, but unfortunately he continued to decline.  After further conversations with family, it was decided to focus on patient's comfort.  He received morphine for respiratory distress.  BiPAP was removed.  The patient expired on 2023-07-03.  Family was informed and provided support.    Pertinent Labs and Studies  Significant Diagnostic Studies CT Abdomen Pelvis Wo Contrast  Result Date: 06/06/2020 CLINICAL DATA:  Non-small cell lung cancer staging EXAM: CT CHEST, ABDOMEN AND PELVIS WITHOUT CONTRAST TECHNIQUE: Multidetector CT imaging of the chest, abdomen and pelvis was performed following the standard protocol without IV contrast. COMPARISON:  None. FINDINGS: CT CHEST FINDINGS Cardiovascular: No pericardial fluid. Coronary calcifications. Pacemaker noted. Mediastinum/Nodes: No axillary or supraclavicular adenopathy. No mediastinal  or hilar adenopathy. No pericardial fluid. Esophagus normal. Lungs/Pleura: Dense consolidation in the LEFT upper lobe with air bronchograms increased in density compared to most recent  CT exam. (Image 60/4). Consultation is peripheral in the LEFT upper lobe and with some extension to the hilum and superior segment of the LEFT lower lobe. No new nodularity. RIGHT lung is clear. And coronal projection the lateral LEFT upper lobe consolidation appears angular extending from the hilum to the pleural surface again suggesting radiation change. Difficult to assess the hilum without IV contrast. Musculoskeletal: No aggressive osseous lesion. CT ABDOMEN AND PELVIS FINDINGS Hepatobiliary: No focal hepatic lesion. No biliary ductal dilatation. Gallbladder is normal. Common bile duct is normal. Pancreas: Pancreas is normal. No ductal dilatation. No pancreatic inflammation. Spleen: Normal spleen Adrenals/urinary tract: Adrenal glands normal. Benign cysts of the RIGHT kidney. Ureters and bladder normal. Stomach/Bowel: Stomach, small bowel, appendix, and cecum are normal. The colon and rectosigmoid colon are normal. Vascular/Lymphatic: Abdominal aorta is normal caliber with atherosclerotic calcification. There is no retroperitoneal or periportal lymphadenopathy. No pelvic lymphadenopathy. Reproductive: Prostate unremarkable Other: No peritoneal metastasis Musculoskeletal: RIGHT hip prosthetic. No aggressive osseous lesion. IMPRESSION: Chest Impression: 1. Increased angular consolidation in the LEFT upper lobe extend from the hilum to the pleural surface is most suggestive of progressive radiation change. No IV contrast limits detail evaluation of the hilum or any enhancing nodularity. However, again favor benign change. 2. No suspicious pulmonary nodules within LEFT or RIGHT lung. 3. No lymphadenopathy. 4. Post CABG. Abdomen / Pelvis Impression: 1. No evidence of metastasis in the upper pelvis on noncontrast exam. 2.  Atherosclerotic calcification of the aorta. Electronically Signed   By: Suzy Bouchard M.D.   On: 06/06/2020 18:15   CT Chest Wo Contrast  Result Date: 06/06/2020 CLINICAL DATA:  Non-small  cell lung cancer staging EXAM: CT CHEST, ABDOMEN AND PELVIS WITHOUT CONTRAST TECHNIQUE: Multidetector CT imaging of the chest, abdomen and pelvis was performed following the standard protocol without IV contrast. COMPARISON:  None. FINDINGS: CT CHEST FINDINGS Cardiovascular: No pericardial fluid. Coronary calcifications. Pacemaker noted. Mediastinum/Nodes: No axillary or supraclavicular adenopathy. No mediastinal or hilar adenopathy. No pericardial fluid. Esophagus normal. Lungs/Pleura: Dense consolidation in the LEFT upper lobe with air bronchograms increased in density compared to most recent CT exam. (Image 60/4). Consultation is peripheral in the LEFT upper lobe and with some extension to the hilum and superior segment of the LEFT lower lobe. No new nodularity. RIGHT lung is clear. And coronal projection the lateral LEFT upper lobe consolidation appears angular extending from the hilum to the pleural surface again suggesting radiation change. Difficult to assess the hilum without IV contrast. Musculoskeletal: No aggressive osseous lesion. CT ABDOMEN AND PELVIS FINDINGS Hepatobiliary: No focal hepatic lesion. No biliary ductal dilatation. Gallbladder is normal. Common bile duct is normal. Pancreas: Pancreas is normal. No ductal dilatation. No pancreatic inflammation. Spleen: Normal spleen Adrenals/urinary tract: Adrenal glands normal. Benign cysts of the RIGHT kidney. Ureters and bladder normal. Stomach/Bowel: Stomach, small bowel, appendix, and cecum are normal. The colon and rectosigmoid colon are normal. Vascular/Lymphatic: Abdominal aorta is normal caliber with atherosclerotic calcification. There is no retroperitoneal or periportal lymphadenopathy. No pelvic lymphadenopathy. Reproductive: Prostate unremarkable Other: No peritoneal metastasis Musculoskeletal: RIGHT hip prosthetic. No aggressive osseous lesion. IMPRESSION: Chest Impression: 1. Increased angular consolidation in the LEFT upper lobe extend from  the hilum to the pleural surface is most suggestive of progressive radiation change. No IV contrast limits detail evaluation of the hilum or any enhancing  nodularity. However, again favor benign change. 2. No suspicious pulmonary nodules within LEFT or RIGHT lung. 3. No lymphadenopathy. 4. Post CABG. Abdomen / Pelvis Impression: 1. No evidence of metastasis in the upper pelvis on noncontrast exam. 2.  Atherosclerotic calcification of the aorta. Electronically Signed   By: Suzy Bouchard M.D.   On: 06/06/2020 18:15   DG Chest Port 1 View  Result Date: 06/15/2020 CLINICAL DATA:  Acute respiratory distress. EXAM: PORTABLE CHEST 1 VIEW COMPARISON:  06/12/2020 FINDINGS: Marked interval progression of diffuse airspace disease in the right lung. Consolidative opacity/scarring in the left parahilar lung is similar to prior. The cardio pericardial silhouette is enlarged. The visualized bony structures of the thorax show no acute abnormality. IMPRESSION: Marked progression of diffuse airspace disease in the right lung compatible with asymmetric edema or diffuse infection. Electronically Signed   By: Misty Stanley M.D.   On: 06/15/2020 05:42   DG Chest Port 1 View  Result Date: 06/15/2020 CLINICAL DATA:  Left sided chest pain EXAM: PORTABLE CHEST 1 VIEW COMPARISON:  01/05/2020 FINDINGS: Band like area of consolidation in the left upper lung extending from the periphery to the suprahilar region. Surgical clips in the left hilum. Right lung is clear. No pleural effusion or pneumothorax. Stable cardiomediastinal silhouette. Prior CABG. IMPRESSION: Band like area of consolidation in the left upper lung extending from the periphery to the suprahilar region. Overall appearance is similar to the prior examination of 01/05/2020. This likely reflects post radiation changes. No acute cardiopulmonary disease. Electronically Signed   By: Kathreen Devoid   On: 06/14/2020 11:52   US Abdomen Limited RUQ (LIVER/GB)  Result Date:  06/15/2020 CLINICAL DATA:  Elevated LFTs EXAM: ULTRASOUND ABDOMEN LIMITED RIGHT UPPER QUADRANT COMPARISON:  None. FINDINGS: Examination is generally limited by body habitus, bowel gas, and patient movement. Gallbladder: No gallstones or wall thickening visualized. No sonographic Murphy sign noted by sonographer. Common bile duct: Diameter: 3 mm Liver: No focal lesion identified. Increased, coarse parenchymal echogenicity. Portal vein is patent on color Doppler imaging with normal direction of blood flow towards the liver. Other: Incidental note of right pleural effusion. Right renal cyst measuring 4.2 cm. IMPRESSION: 1. Generally limited examination. Within this limitation, there is increased, coarse parenchymal echogenicity suggesting steatosis and chronic liver disease. No overt morphologic stigmata of cirrhosis. 2. No biliary ductal dilatation. 3. Incidental note of right pleural effusion. Electronically Signed   By: Eddie Candle M.D.   On: 06/15/2020 13:55    Microbiology Recent Results (from the past 240 hour(s))  Resp Panel by RT-PCR (Flu A&B, Covid) Nasopharyngeal Swab     Status: None   Collection Time: 06/14/2020 11:13 AM   Specimen: Nasopharyngeal Swab; Nasopharyngeal(NP) swabs in vial transport medium  Result Value Ref Range Status   SARS Coronavirus 2 by RT PCR NEGATIVE NEGATIVE Final    Comment: (NOTE) SARS-CoV-2 target nucleic acids are NOT DETECTED.  The SARS-CoV-2 RNA is generally detectable in upper respiratory specimens during the acute phase of infection. The lowest concentration of SARS-CoV-2 viral copies this assay can detect is 138 copies/mL. A negative result does not preclude SARS-Cov-2 infection and should not be used as the sole basis for treatment or other patient management decisions. A negative result may occur with  improper specimen collection/handling, submission of specimen other than nasopharyngeal swab, presence of viral mutation(s) within the areas targeted by  this assay, and inadequate number of viral copies(<138 copies/mL). A negative result must be combined with clinical observations, patient history,  and epidemiological information. The expected result is Negative.  Fact Sheet for Patients:  EntrepreneurPulse.com.au  Fact Sheet for Healthcare Providers:  IncredibleEmployment.be  This test is no t yet approved or cleared by the [place] FDA and  has been authorized for detection and/or diagnosis of SARS-CoV-2 by FDA under an Emergency Use Authorization (EUA). This EUA will remain  in effect (meaning this test can be used) for the duration of the COVID-19 declaration under Section 564(b)(1) of the Act, 21 U.S.C.section 360bbb-3(b)(1), unless the authorization is terminated  or revoked sooner.       Influenza A by PCR NEGATIVE NEGATIVE Final   Influenza B by PCR NEGATIVE NEGATIVE Final    Comment: (NOTE) The Xpert Xpress SARS-CoV-2/FLU/RSV plus assay is intended as an aid in the diagnosis of influenza from Nasopharyngeal swab specimens and should not be used as a sole basis for treatment. Nasal washings and aspirates are unacceptable for Xpert Xpress SARS-CoV-2/FLU/RSV testing.  Fact Sheet for Patients: EntrepreneurPulse.com.au  Fact Sheet for Healthcare Providers: IncredibleEmployment.be  This test is not yet approved or cleared by the [place] FDA and has been authorized for detection and/or diagnosis of SARS-CoV-2 by FDA under an Emergency Use Authorization (EUA). This EUA will remain in effect (meaning this test can be used) for the duration of the COVID-19 declaration under Section 564(b)(1) of the Act, 21 U.S.C. section 360bbb-3(b)(1), unless the authorization is terminated or revoked.  Performed at [facility], [place] [address] [place], [place] [address]   Blood culture (routine x 2)     Status: None (Preliminary result)    Collection Time: 05/29/2020  1:10 PM   Specimen: BLOOD  Result Value Ref Range Status   Specimen Description BLOOD LEFT ANTECUBITAL  Final   Special Requests   Final    BOTTLES DRAWN AEROBIC AND ANAEROBIC Blood Culture adequate volume   Culture   Final    NO GROWTH 3 DAYS Performed at [facility], [place] [address], [place], [place] [address]    Report Status PENDING  Incomplete  Blood culture (routine x 2)     Status: Abnormal   Collection Time: 06/10/2020  1:55 PM   Specimen: BLOOD RIGHT WRIST  Result Value Ref Range Status   Specimen Description BLOOD RIGHT WRIST  Final   Special Requests   Final    BOTTLES DRAWN AEROBIC AND ANAEROBIC Blood Culture adequate volume   Culture  Setup Time   Final    GRAM POSITIVE COCCI IN CLUSTERS ANAEROBIC BOTTLE ONLY Organism ID to follow    Culture (A)  Final    STAPHYLOCOCCUS CAPITIS THE SIGNIFICANCE OF ISOLATING THIS ORGANISM FROM A SINGLE VENIPUNCTURE CANNOT BE PREDICTED WITHOUT FURTHER CLINICAL AND CULTURE CORRELATION. SUSCEPTIBILITIES AVAILABLE ONLY ON REQUEST. Performed at [facility], [place] [address], [place], [place] [address]    Report Status July 15, 2020 FINAL  Final  Blood Culture ID Panel (Reflexed)     Status: Abnormal   Collection Time: 05/22/2020  1:55 PM  Result Value Ref Range Status   Enterococcus faecalis NOT DETECTED NOT DETECTED Final   Enterococcus Faecium NOT DETECTED NOT DETECTED Final   Listeria monocytogenes NOT DETECTED NOT DETECTED Final   Staphylococcus species DETECTED (A) NOT DETECTED Final    Comment: CRITICAL RESULT CALLED TO, READ BACK BY AND VERIFIED WITH: Tillman Sers PharmD 15:35 06/14/20 ([email or web address])    Staphylococcus aureus (BCID) NOT DETECTED NOT DETECTED Final   Staphylococcus epidermidis NOT DETECTED NOT DETECTED Final  Staphylococcus lugdunensis NOT DETECTED NOT DETECTED Final   Streptococcus species NOT DETECTED NOT DETECTED Final   Streptococcus agalactiae NOT DETECTED NOT DETECTED Final    Streptococcus pneumoniae NOT DETECTED NOT DETECTED Final   Streptococcus pyogenes NOT DETECTED NOT DETECTED Final   A.calcoaceticus-baumannii NOT DETECTED NOT DETECTED Final   Bacteroides fragilis NOT DETECTED NOT DETECTED Final   Enterobacterales NOT DETECTED NOT DETECTED Final   Enterobacter cloacae complex NOT DETECTED NOT DETECTED Final   Escherichia coli NOT DETECTED NOT DETECTED Final   Klebsiella aerogenes NOT DETECTED NOT DETECTED Final   Klebsiella oxytoca NOT DETECTED NOT DETECTED Final   Klebsiella pneumoniae NOT DETECTED NOT DETECTED Final   Proteus species NOT DETECTED NOT DETECTED Final   Salmonella species NOT DETECTED NOT DETECTED Final   Serratia marcescens NOT DETECTED NOT DETECTED Final   Haemophilus influenzae NOT DETECTED NOT DETECTED Final   Neisseria meningitidis NOT DETECTED NOT DETECTED Final   Pseudomonas aeruginosa NOT DETECTED NOT DETECTED Final   Stenotrophomonas maltophilia NOT DETECTED NOT DETECTED Final   Candida albicans NOT DETECTED NOT DETECTED Final   Candida auris NOT DETECTED NOT DETECTED Final   Candida glabrata NOT DETECTED NOT DETECTED Final   Candida krusei NOT DETECTED NOT DETECTED Final   Candida parapsilosis NOT DETECTED NOT DETECTED Final   Candida tropicalis NOT DETECTED NOT DETECTED Final   Cryptococcus neoformans/gattii NOT DETECTED NOT DETECTED Final    Comment: Performed at [facility], [address], [place], [place] [address]  Urine culture     Status: None   Collection Time: 06/07/2020  2:25 PM   Specimen: In/Out Cath Urine  Result Value Ref Range Status   Specimen Description IN/OUT CATH URINE  Final   Special Requests NONE  Final   Culture   Final    NO GROWTH Performed at [facility], [place] [address] [place], [place] [address]    Report Status 06/15/2020 FINAL  Final  Culture, blood (routine x 2)     Status: None (Preliminary result)   Collection Time: 06/15/20  9:50 AM   Specimen: BLOOD  Result Value Ref  Range Status   Specimen Description BLOOD RIGHT ANTECUBITAL  Final   Special Requests   Final    BOTTLES DRAWN AEROBIC AND ANAEROBIC Blood Culture adequate volume   Culture   Final    NO GROWTH 1 DAY Performed at [facility], [place] [address], [place], [place] [address]    Report Status PENDING  Incomplete  Culture, blood (routine x 2)     Status: None (Preliminary result)   Collection Time: 06/15/20 10:10 AM   Specimen: BLOOD RIGHT FOREARM  Result Value Ref Range Status   Specimen Description BLOOD RIGHT FOREARM  Final   Special Requests   Final    BOTTLES DRAWN AEROBIC ONLY Blood Culture results may not be optimal due to an inadequate volume of blood received in culture bottles   Culture   Final    NO GROWTH 1 DAY Performed at [facility], [place] [address], [place],  [address]    Report Status PENDING  Incomplete    Lab Basic Metabolic Panel: Recent Labs  Lab 05/30/2020 1134 06/14/20 0044 06/15/20 0050 06/15/20 0328 Jul 07, 2020 0111  NA 130* 134* 133*  --  134*  K 6.1* 5.1 6.2*  --  6.2*  CL 98 101 103  --  102  CO2 13* 14* 12*  --  13*  GLUCOSE 73 77  88  --  122*  BUN 66* 67* 82*  --  99*  CREATININE 2.58* 2.96* 3.78*  --  4.39*  CALCIUM 9.9 10.3 9.4  --  8.6*  MG  --  1.7  --  2.0  --   PHOS  --  6.0* 6.8*  --   --    Liver Function Tests: Recent Labs  Lab 06/18/2020 1134 06/14/20 0044 06/15/20 0050 06/15/20 0328 2020-07-10 0111  AST 113* 160*  --  1,202* 1,635*  ALT 54* 79*  --  440* 631*  ALKPHOS 87 86  --  90 94  BILITOT 4.6* 4.9*  --  5.8* 6.6*  PROT 6.7 6.4*  --  6.1* 5.3*  ALBUMIN 2.6* 2.5* 2.3* 2.3* 2.0*   No results for input(s): LIPASE, AMYLASE in the last 168 hours. Recent Labs  Lab 06/15/20 0603  AMMONIA 27   CBC: Recent Labs  Lab 06/14/2020 1134 06/14/20 0044 06/15/20 0050 07/10/2020 0453  WBC 15.8* 16.9* 20.0* 19.8*  NEUTROABS 13.9*  --   --   --   HGB 11.6* 11.1* 11.5* 9.6*  HCT 39.0 38.3* 38.7* 30.9*  MCV 90.3 88.0 88.8  85.8  PLT 141* 170 133* 136*   Cardiac Enzymes: No results for input(s): CKTOTAL, CKMB, CKMBINDEX, TROPONINI in the last 168 hours. Sepsis Labs: Recent Labs  Lab 06/08/2020 1134 05/28/2020 1409 06/11/2020 1634 05/28/2020 2319 06/14/20 0044 06/15/20 0050 06/15/20 0328 06/15/20 0950 06/15/20 1333 Jul 10, 2020 0453  PROCALCITON  --   --  1.22  --  1.38  --  7.73  --   --   --   WBC 15.8*  --   --   --  16.9* 20.0*  --   --   --  19.8*  LATICACIDVEN  --    < >  --    < > 8.3* 7.7*  --  3.9* 3.0*  --    < > = values in this interval not displayed.    Procedures/Operations     [employer] 07/10/2020, 6:00 PM

## 2020-06-22 NOTE — Progress Notes (Signed)
   06/21/2020 1100  Clinical Encounter Type  Visited With Patient and family together  Visit Type Death  Referral From Nurse  Consult/Referral To Chaplain  Spiritual Encounters  Spiritual Needs Grief support;Emotional;Prayer  Stress Factors  Patient Stress Factors Loss  Family Stress Factors Loss  Chaplain was called to patient's room by nurse. Patient had just passed. Daughter, son, sister and brother were present. Chaplain offered prayer and presence. Gave family placement card.

## 2020-06-22 NOTE — Progress Notes (Signed)
RT note-Patient expired, removed from [place] by RN.

## 2020-06-22 NOTE — Progress Notes (Signed)
Patient's MEWS score was 4 due to hypotensive, tachycardia and high respiratory rate. Paging  Saralyn Pilar, rapid response nurse regarding this matter after shift change and charge nurse made aware of it. Dr. Roderic Palau talked patient's family due to worsting his condition. Now patient's comfort care. Dr. Roderic Palau ordered to take off BiPAP machine. Removed Bi PAP from patient face. When came back to administer morphine, patient was expired within 25 min after removed BiPAP. Pronounce of death was 10:20.  Dr. Roderic Palau notified this matter and Dr. Roderic Palau talked to pt's sister regarding this matter. Patient's family came at the bedside. Paging chaplain for providing comfort to family. Body move to morgue. [employer]

## 2020-06-22 NOTE — Progress Notes (Signed)
SLP Cancellation Note  Patient Details Name: [name] [name]. MRN: [id number] DOB: [birth year]   Cancelled treatment:       Reason Eval/Treat Not Completed: Medical issues which prohibited therapy (Pt currently requires biPAP and RN reports increasing medical needs. SLP will f/u on 12/27 unless contacted sooner due to improvement in status.)  Yeng Perz I. Hardin Negus, [place], [place] Office number [phone] Pager [phone]  Horton Marshall 2020/07/13, 9:37 AM

## 2020-06-22 DEATH — deceased

## 2020-06-24 ENCOUNTER — Telehealth: Payer: Self-pay

## 2020-06-25 ENCOUNTER — Encounter (HOSPITAL_COMMUNITY): Payer: Medicare HMO

## 2020-06-26 NOTE — Telephone Encounter (Signed)
ERROR

## 2020-07-12 ENCOUNTER — Other Ambulatory Visit: Payer: Medicare HMO

## 2020-07-15 ENCOUNTER — Ambulatory Visit: Payer: Medicare HMO | Admitting: Internal Medicine

## 2020-07-25 ENCOUNTER — Ambulatory Visit: Payer: Medicare HMO | Admitting: Podiatry

## 2021-08-03 IMAGING — DX DG CHEST 2V
2 series · 2 of 2 positions shown · non-contrast
Comparison: 02/14/2019

CLINICAL DATA: Left-sided chest pain for a few weeks

EXAM:
CHEST - 2 VIEW

[w chest pa]
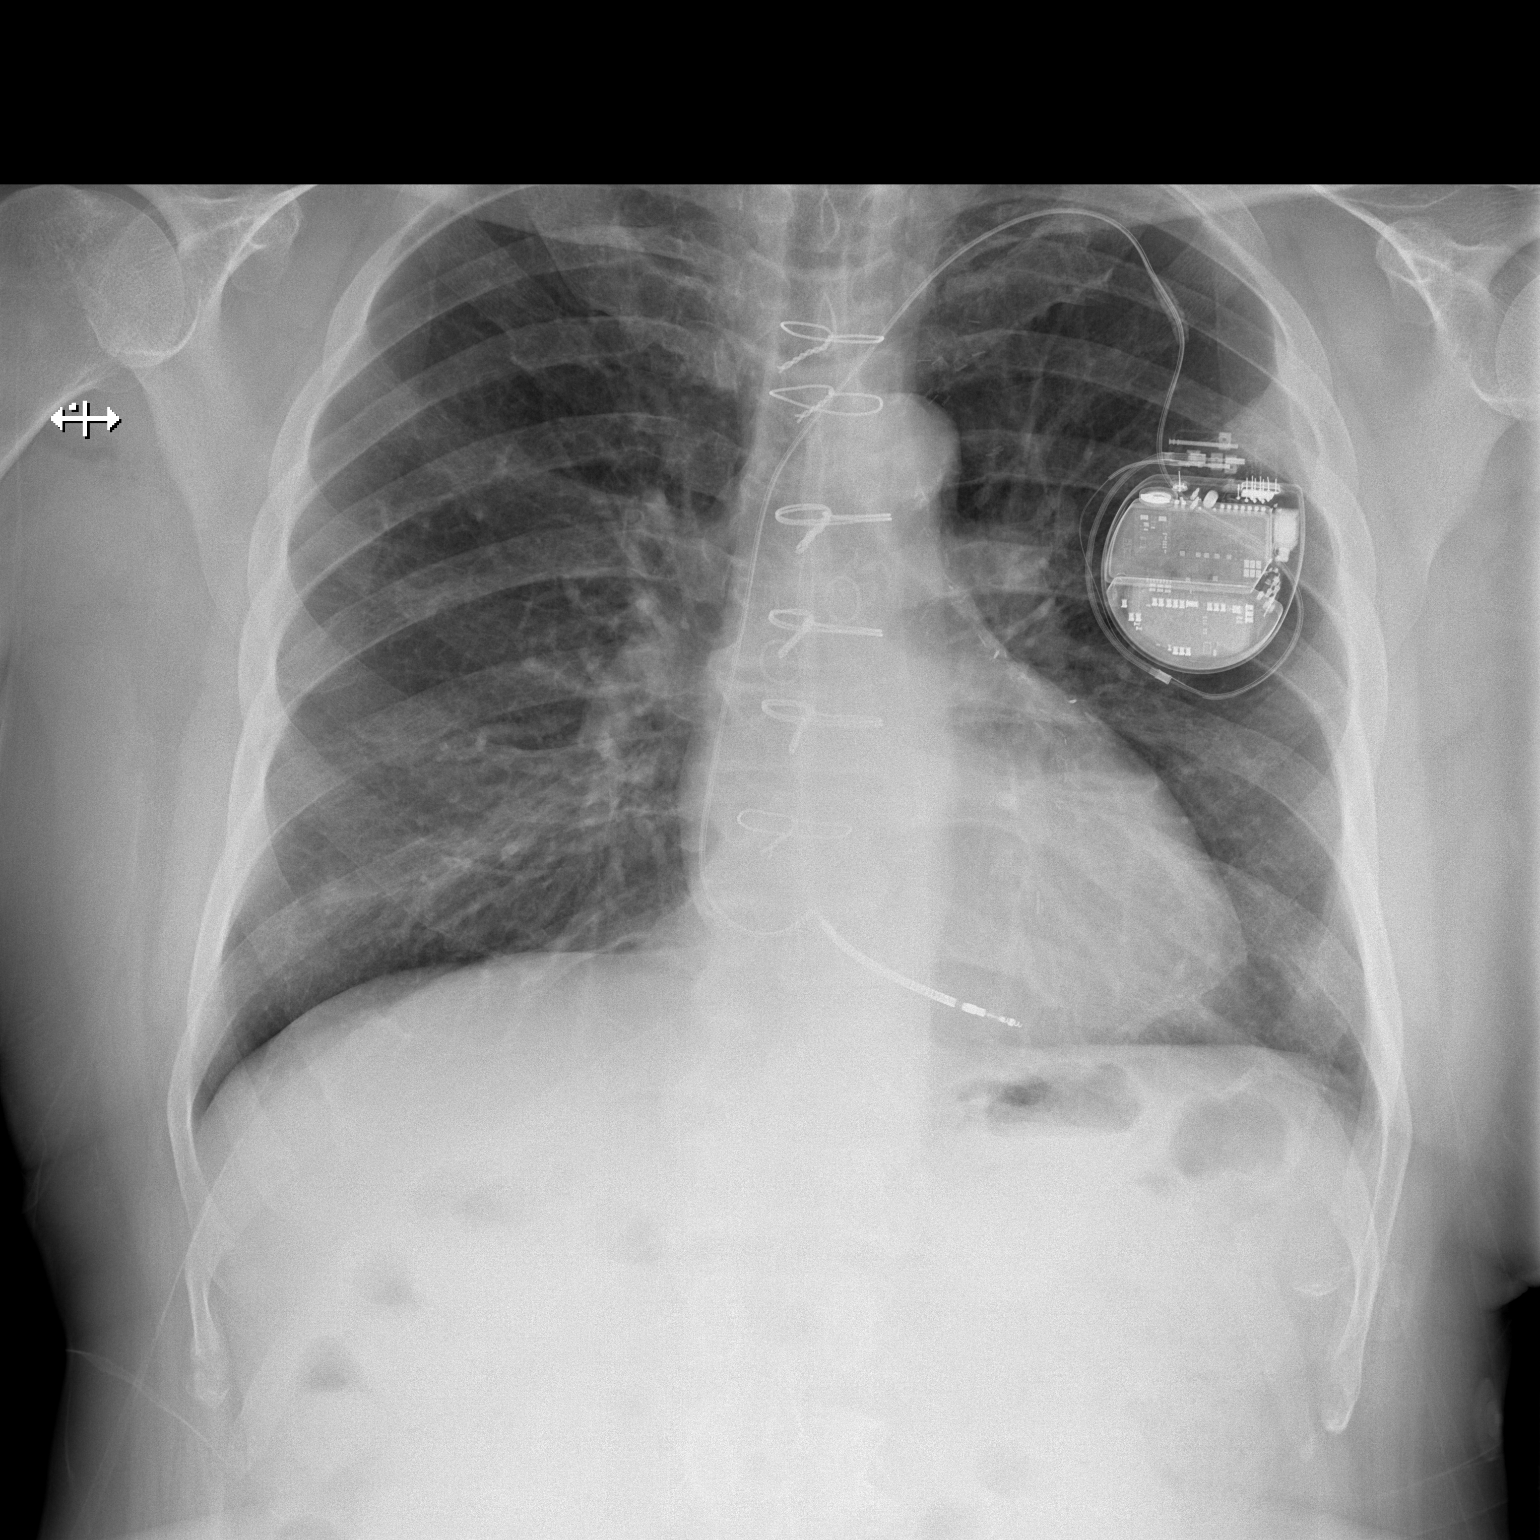

[w chest lat]
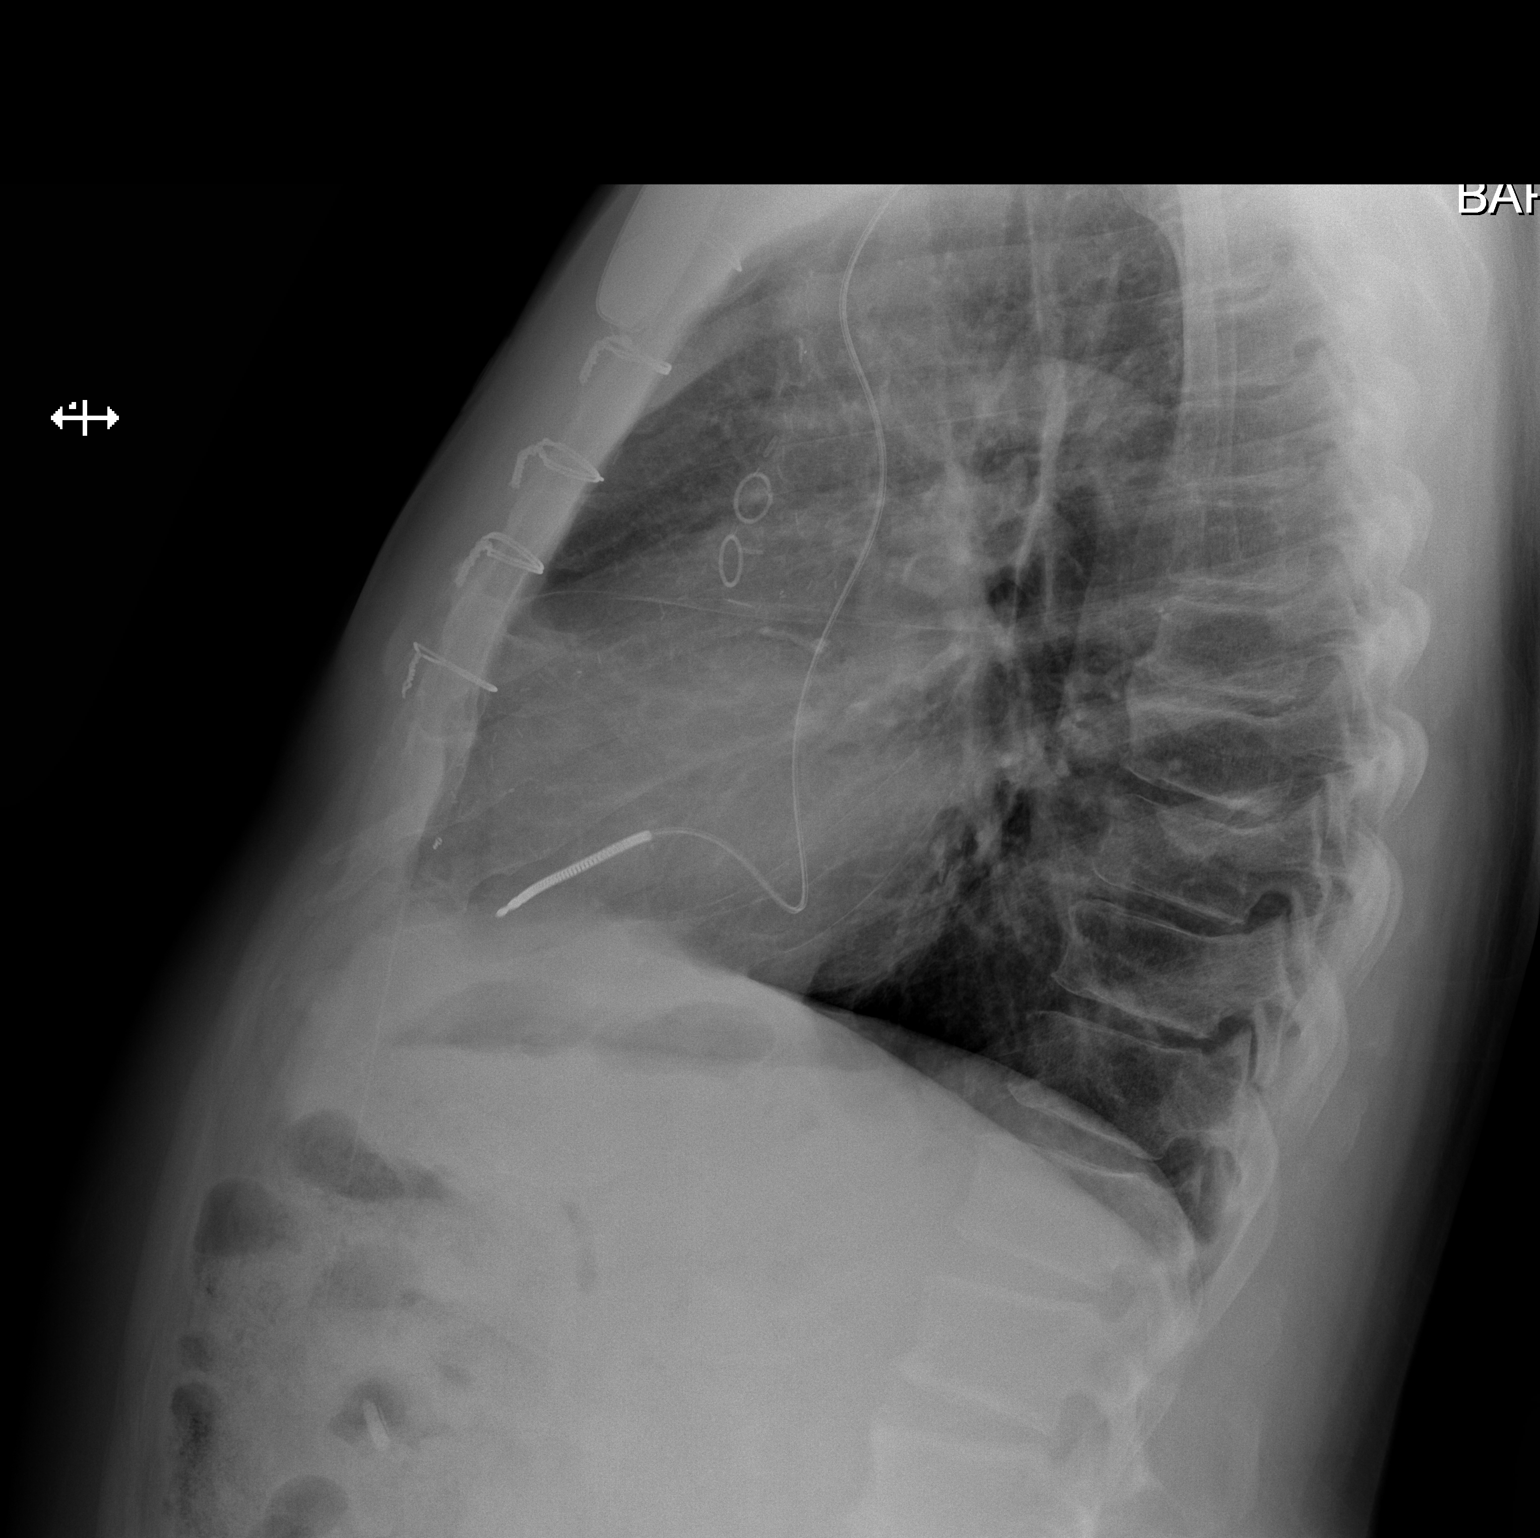

[2 of 2 positions shown; findings below may reference images not displayed]

FINDINGS: Normal heart size. CABG. ICD lead into the right ventricle in stable
position. There is no edema, consolidation, effusion, or
pneumothorax.
IMPRESSION: No evidence of active disease.

## 2021-08-20 IMAGING — CT CT CHEST W/O CM
3 of 4 series · 17 of 36 positions shown, 19 images · non-contrast
Comparison: CT chest 03/07/2019

CLINICAL DATA: Acute respiratory illness. Chest pain and shortness
of breath.

EXAM:
CT CHEST WITHOUT CONTRAST
TECHNIQUE: Multidetector CT imaging of the chest was performed following the
standard protocol without IV contrast.

[Series 4: thorax 5.0 i31f 2 · axial · 0.88mm/px · z∈[-541,-291]mm · 6 of 71 slices shown, 8 images]
[im 11/71  mediastinal]
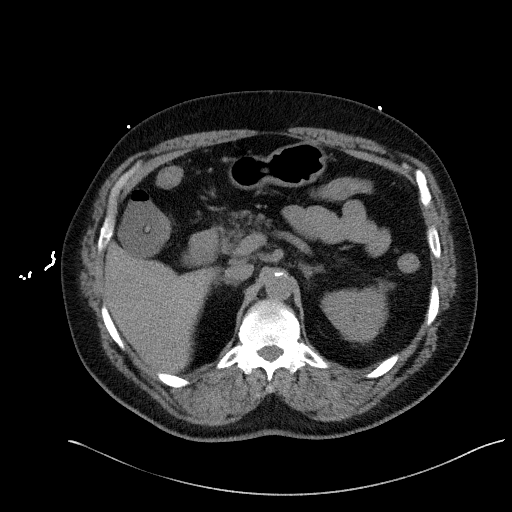
[im 11/71  lung]
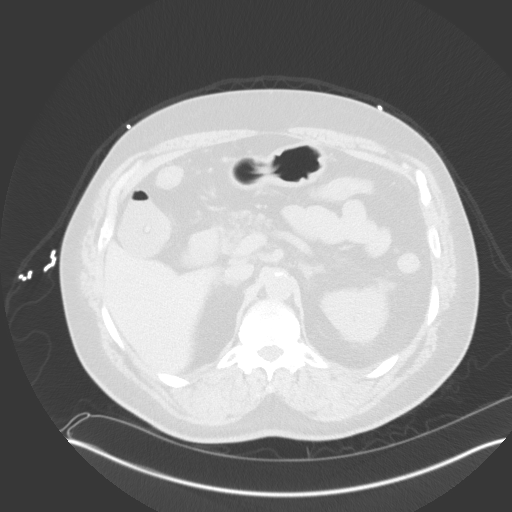
[im 21/71  lung]
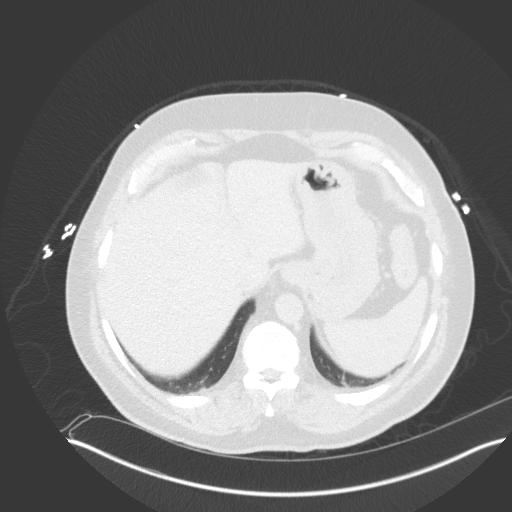
[im 31/71  lung]
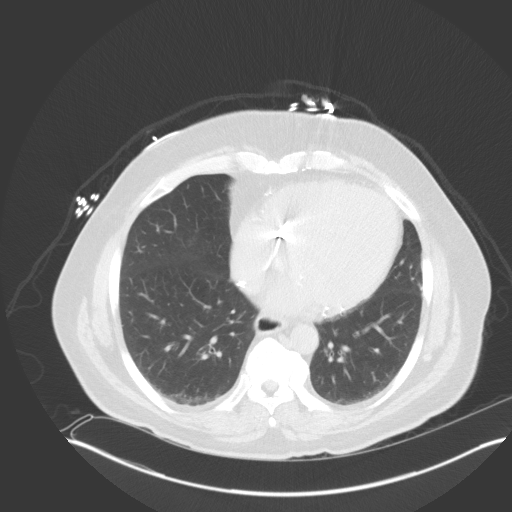
[im 41/71  lung]
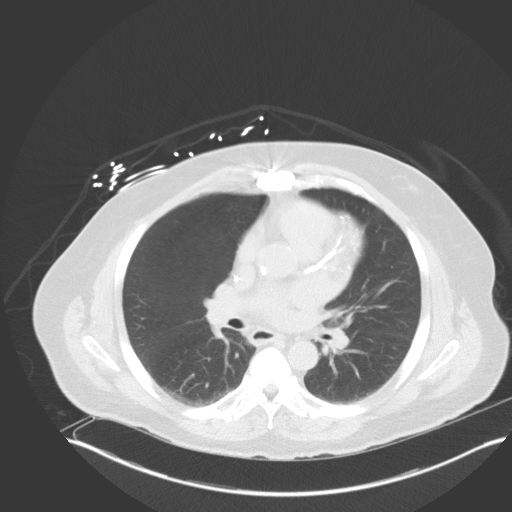
[im 51/71  mediastinal]
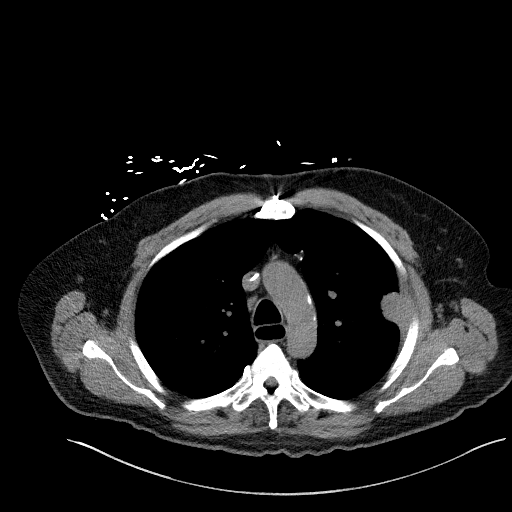
[im 51/71  lung]
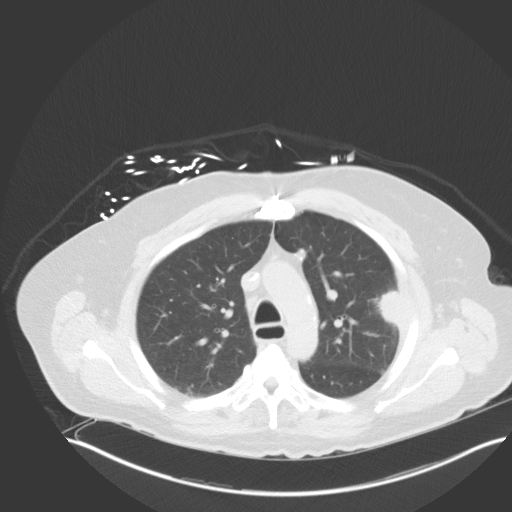
[im 61/71  lung]
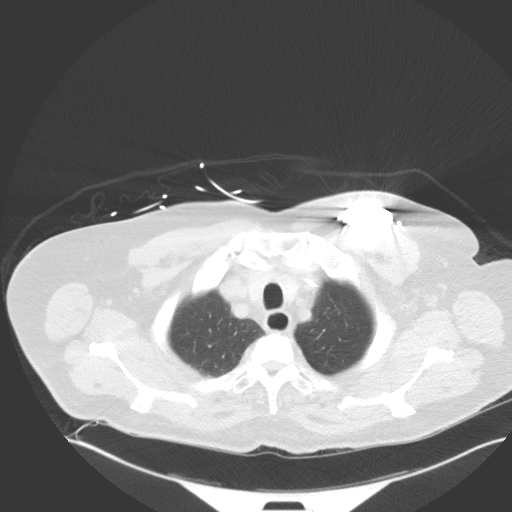

[Series 5: thorax 2.0 · axial · 0.88mm/px · z∈[-539,-277]mm · 8 of 167 slices shown]
[im 18/167  lung]
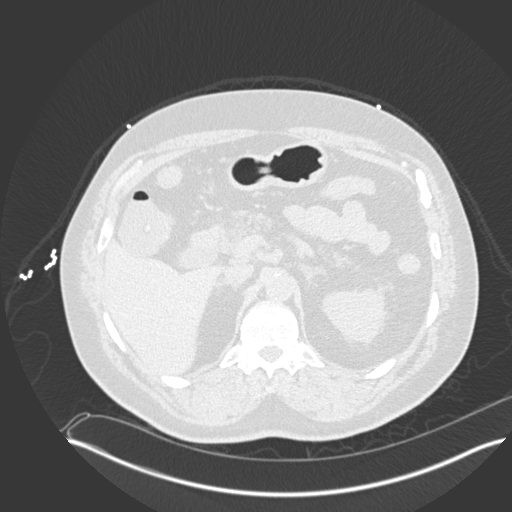
[im 35/167  lung]
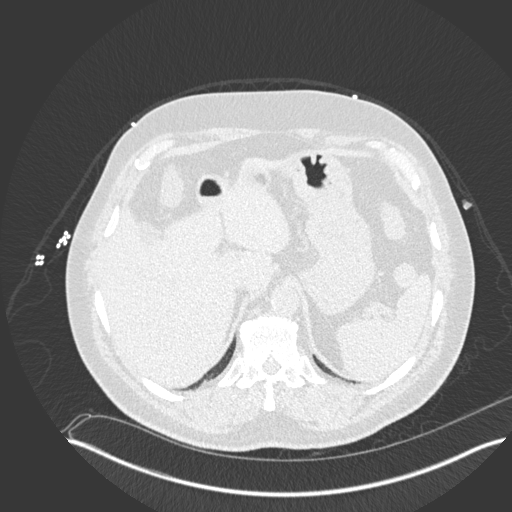
[im 53/167  lung]
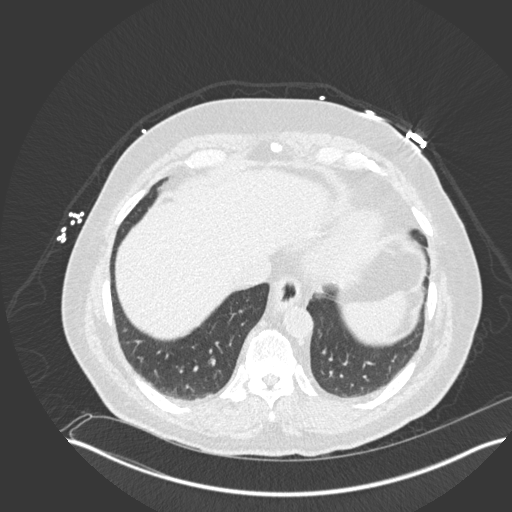
[im 70/167  lung]
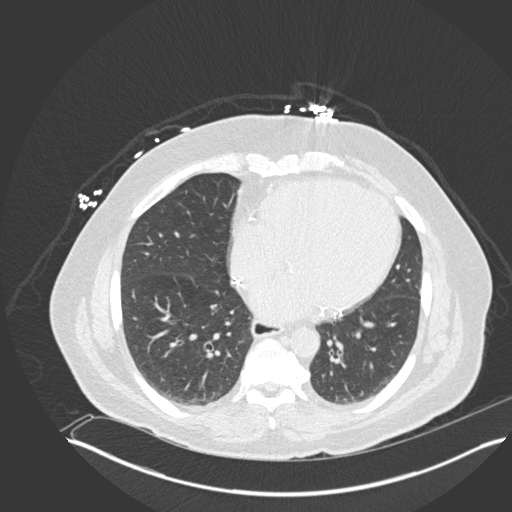
[im 97/167  lung]
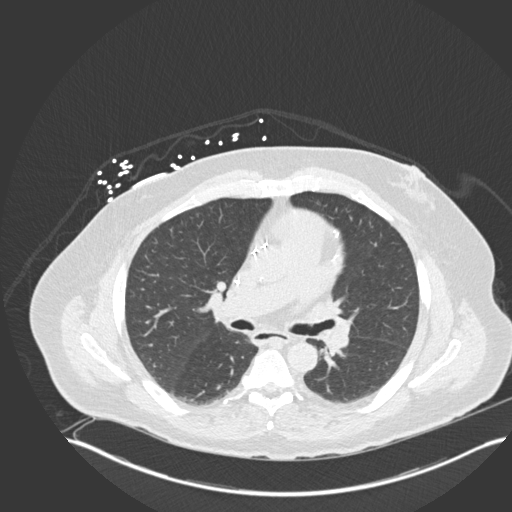
[im 114/167  lung]
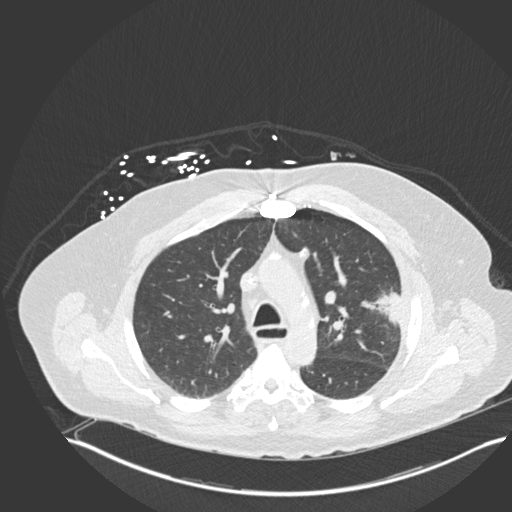
[im 132/167  lung]
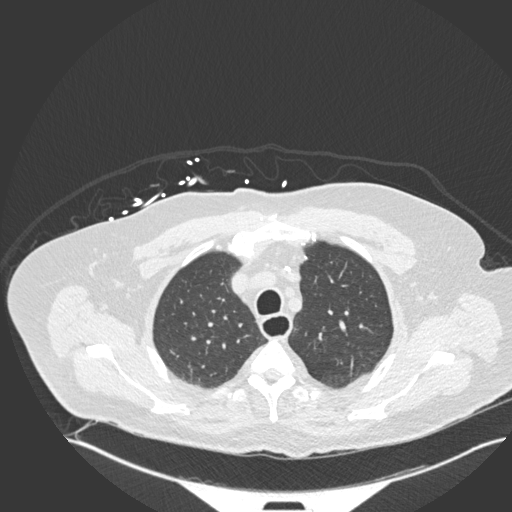
[im 149/167  lung]
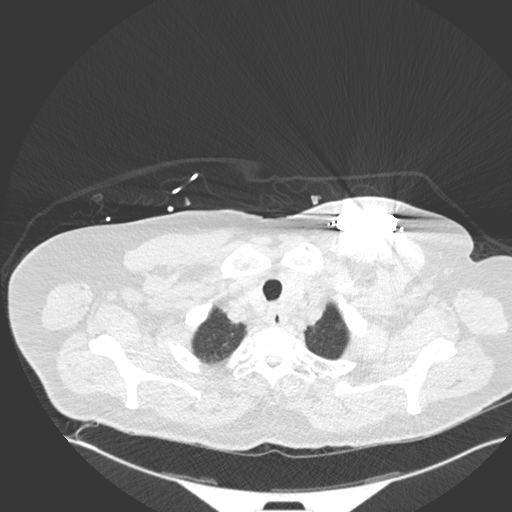

[Series 8: coronal · coronal · 0.68mm/px · 3 of 111 slices shown]
[im 23/111  lung]
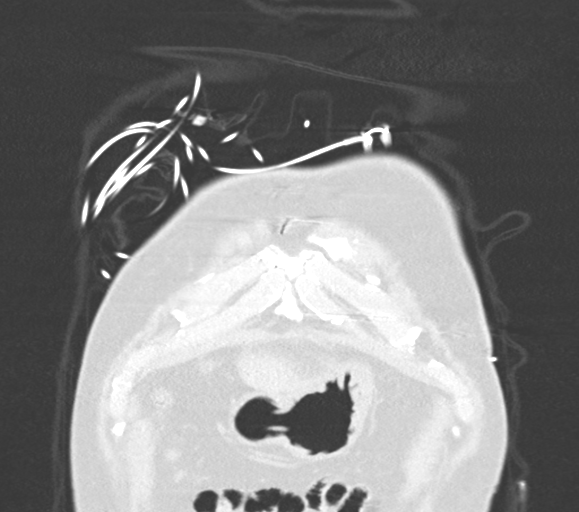
[im 45/111  lung]
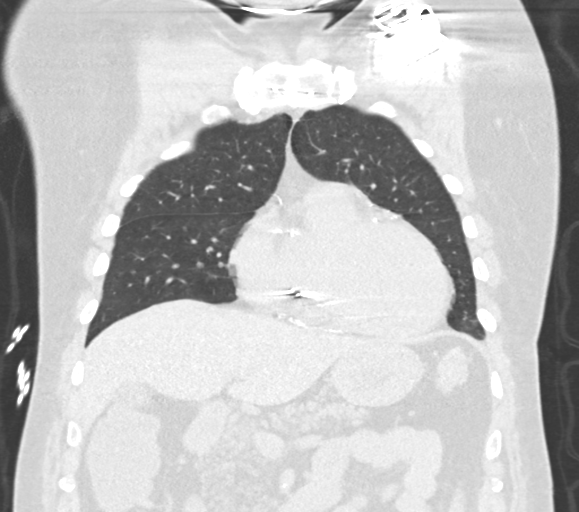
[im 67/111  lung]
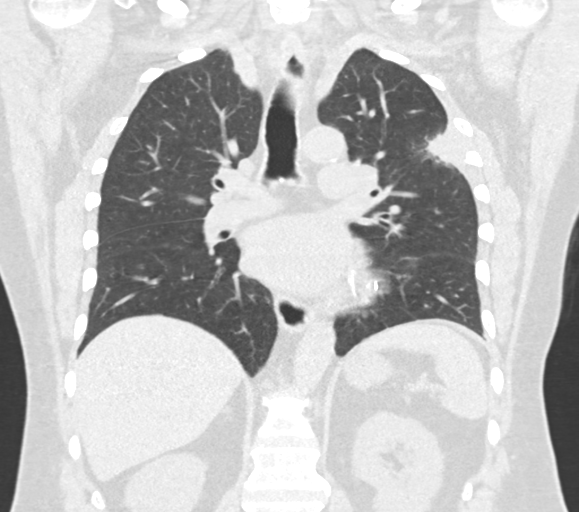

[17 of 36 positions shown; findings below may reference images not displayed]

FINDINGS: Cardiovascular: AICD noted. Prior CABG. Coronary, aortic arch, and
branch vessel atherosclerotic vascular disease. Mild cardiomegaly.

Mediastinum/Nodes: No mediastinal hematoma or adenopathy identified.
Mildly dilated gas-filled esophagus.

Lungs/Pleura: Peripheral mass in the left upper lobe abutting the
pleura, approximately 3.1 by 2.2 cm on image 52/7, as shown on the
03/07/2019 exam. This lesion is concerning for malignancy. Adjacent
pleural thickening noted.

Calcified pleural based nodule anteriorly along the right upper
lobe, image 59/7, stable.

Upper Abdomen: Hypodense right renal lesion, nonspecific. Abdominal
aortic atherosclerosis.

Musculoskeletal: Prominence of epidural adipose tissues in the
thoracic spine.
IMPRESSION: 1. Peripheral mass in the left upper lobe abutting the pleura,
by 2.2 cm in long axis, suspicious for malignancy.
2. Other imaging findings of potential clinical significance:
Coronary atherosclerosis. Mild cardiomegaly. Prior CABG. Prominence
of epidural adipose tissues in the thoracic spine. Hypodense right
renal lesion, nonspecific but statistically likely to be a cyst.
3. Mildly dilated and gas-filled esophagus.

Aortic Atherosclerosis ([id number]).
